# Patient Record
Sex: Male | Born: 1970 | Race: Black or African American | Hispanic: No | Marital: Married | Smoking: Former smoker
Health system: Southern US, Community
[De-identification: ages and names within clinical notes are randomized; demographics above are authoritative.]

## PROBLEM LIST (undated history)

## (undated) DIAGNOSIS — E119 Type 2 diabetes mellitus without complications: Secondary | ICD-10-CM

## (undated) DIAGNOSIS — D649 Anemia, unspecified: Secondary | ICD-10-CM

## (undated) DIAGNOSIS — N2581 Secondary hyperparathyroidism of renal origin: Secondary | ICD-10-CM

## (undated) DIAGNOSIS — N189 Chronic kidney disease, unspecified: Secondary | ICD-10-CM

## (undated) DIAGNOSIS — E785 Hyperlipidemia, unspecified: Secondary | ICD-10-CM

## (undated) DIAGNOSIS — R609 Edema, unspecified: Secondary | ICD-10-CM

## (undated) DIAGNOSIS — I519 Heart disease, unspecified: Secondary | ICD-10-CM

## (undated) DIAGNOSIS — I10 Essential (primary) hypertension: Secondary | ICD-10-CM

## (undated) DIAGNOSIS — K922 Gastrointestinal hemorrhage, unspecified: Secondary | ICD-10-CM

## (undated) DIAGNOSIS — R7881 Bacteremia: Secondary | ICD-10-CM

## (undated) DIAGNOSIS — I38 Endocarditis, valve unspecified: Secondary | ICD-10-CM

## (undated) DIAGNOSIS — Z992 Dependence on renal dialysis: Secondary | ICD-10-CM

## (undated) DIAGNOSIS — I420 Dilated cardiomyopathy: Secondary | ICD-10-CM

## (undated) DIAGNOSIS — J45909 Unspecified asthma, uncomplicated: Secondary | ICD-10-CM

## (undated) DIAGNOSIS — I509 Heart failure, unspecified: Secondary | ICD-10-CM

## (undated) DIAGNOSIS — T7840XA Allergy, unspecified, initial encounter: Secondary | ICD-10-CM

## (undated) DIAGNOSIS — I6522 Occlusion and stenosis of left carotid artery: Secondary | ICD-10-CM

## (undated) DIAGNOSIS — N186 End stage renal disease: Secondary | ICD-10-CM

## (undated) DIAGNOSIS — G4730 Sleep apnea, unspecified: Secondary | ICD-10-CM

## (undated) DIAGNOSIS — Z5189 Encounter for other specified aftercare: Secondary | ICD-10-CM

## (undated) HISTORY — DX: End stage renal disease: Z99.2

## (undated) HISTORY — PX: UPPER GASTROINTESTINAL ENDOSCOPY: SHX188

## (undated) HISTORY — DX: Dilated cardiomyopathy: I42.0

## (undated) HISTORY — PX: LAPAROSCOPIC GASTROTOMY W/ REPAIR OF ULCER: SUR772

## (undated) HISTORY — PX: COLONOSCOPY: SHX174

## (undated) HISTORY — DX: Essential (primary) hypertension: I10

## (undated) HISTORY — PX: TONSILLECTOMY: SUR1361

## (undated) HISTORY — DX: Anemia, unspecified: D64.9

## (undated) HISTORY — DX: Secondary hyperparathyroidism of renal origin: N25.81

## (undated) HISTORY — DX: Sleep apnea, unspecified: G47.30

## (undated) HISTORY — PX: PLEURAL SCARIFICATION: SHX748

## (undated) HISTORY — DX: Morbid (severe) obesity due to excess calories: E66.01

## (undated) HISTORY — DX: Endocarditis, valve unspecified: I38

## (undated) HISTORY — DX: End stage renal disease: N18.6

## (undated) HISTORY — PX: ACHILLES TENDON REPAIR: SUR1153

## (undated) HISTORY — DX: Occlusion and stenosis of left carotid artery: I65.22

## (undated) HISTORY — DX: Chronic kidney disease, unspecified: N18.9

## (undated) HISTORY — PX: DIALYSIS FISTULA CREATION: SHX611

## (undated) HISTORY — DX: Heart failure, unspecified: I50.9

## (undated) HISTORY — PX: OTHER SURGICAL HISTORY: SHX169

## (undated) HISTORY — DX: Unspecified asthma, uncomplicated: J45.909

## (undated) HISTORY — DX: Bacteremia: R78.81

## (undated) HISTORY — DX: Type 2 diabetes mellitus without complications: E11.9

## (undated) HISTORY — DX: Hyperlipidemia, unspecified: E78.5

## (undated) HISTORY — DX: Heart disease, unspecified: I51.9

## (undated) HISTORY — DX: Edema, unspecified: R60.9

## (undated) HISTORY — PX: STOMACH SURGERY: SHX791

## (undated) HISTORY — DX: Allergy, unspecified, initial encounter: T78.40XA

## (undated) HISTORY — DX: Encounter for other specified aftercare: Z51.89

## (undated) HISTORY — DX: Gastrointestinal hemorrhage, unspecified: K92.2

---

## 1999-09-27 ENCOUNTER — Ambulatory Visit: Admission: RE | Admit: 1999-09-27 | Discharge: 1999-09-27 | Payer: Self-pay | Admitting: Endocrinology

## 2000-03-10 ENCOUNTER — Encounter: Payer: Self-pay | Admitting: Endocrinology

## 2000-03-10 ENCOUNTER — Ambulatory Visit (HOSPITAL_COMMUNITY): Admission: RE | Admit: 2000-03-10 | Discharge: 2000-03-10 | Payer: Self-pay | Admitting: Endocrinology

## 2000-03-28 ENCOUNTER — Encounter: Admission: RE | Admit: 2000-03-28 | Discharge: 2000-06-26 | Payer: Self-pay | Admitting: Endocrinology

## 2007-10-18 ENCOUNTER — Ambulatory Visit: Payer: Self-pay | Admitting: Internal Medicine

## 2007-10-18 DIAGNOSIS — J309 Allergic rhinitis, unspecified: Secondary | ICD-10-CM | POA: Insufficient documentation

## 2007-10-18 DIAGNOSIS — G4733 Obstructive sleep apnea (adult) (pediatric): Secondary | ICD-10-CM | POA: Insufficient documentation

## 2007-10-28 DIAGNOSIS — J45909 Unspecified asthma, uncomplicated: Secondary | ICD-10-CM | POA: Insufficient documentation

## 2008-10-17 ENCOUNTER — Ambulatory Visit: Payer: Self-pay | Admitting: Internal Medicine

## 2008-11-10 ENCOUNTER — Inpatient Hospital Stay (HOSPITAL_COMMUNITY): Admission: EM | Admit: 2008-11-10 | Discharge: 2008-11-14 | Payer: Self-pay | Admitting: Emergency Medicine

## 2008-11-10 ENCOUNTER — Emergency Department (HOSPITAL_COMMUNITY): Admission: EM | Admit: 2008-11-10 | Discharge: 2008-11-10 | Payer: Self-pay | Admitting: Family Medicine

## 2008-11-10 ENCOUNTER — Encounter (INDEPENDENT_AMBULATORY_CARE_PROVIDER_SITE_OTHER): Payer: Self-pay | Admitting: Cardiology

## 2009-05-04 ENCOUNTER — Telehealth (INDEPENDENT_AMBULATORY_CARE_PROVIDER_SITE_OTHER): Payer: Self-pay | Admitting: *Deleted

## 2009-05-05 ENCOUNTER — Telehealth (INDEPENDENT_AMBULATORY_CARE_PROVIDER_SITE_OTHER): Payer: Self-pay | Admitting: *Deleted

## 2009-05-07 ENCOUNTER — Encounter: Payer: Self-pay | Admitting: Internal Medicine

## 2009-05-16 DIAGNOSIS — K922 Gastrointestinal hemorrhage, unspecified: Secondary | ICD-10-CM

## 2009-05-16 HISTORY — DX: Gastrointestinal hemorrhage, unspecified: K92.2

## 2009-06-10 ENCOUNTER — Encounter: Payer: Self-pay | Admitting: Internal Medicine

## 2009-06-16 ENCOUNTER — Telehealth: Payer: Self-pay | Admitting: Internal Medicine

## 2009-09-14 ENCOUNTER — Inpatient Hospital Stay (HOSPITAL_COMMUNITY): Admission: EM | Admit: 2009-09-14 | Discharge: 2009-09-17 | Payer: Self-pay | Admitting: Emergency Medicine

## 2009-09-15 ENCOUNTER — Ambulatory Visit: Payer: Self-pay | Admitting: Internal Medicine

## 2009-09-17 ENCOUNTER — Encounter (INDEPENDENT_AMBULATORY_CARE_PROVIDER_SITE_OTHER): Payer: Self-pay | Admitting: *Deleted

## 2009-10-14 ENCOUNTER — Ambulatory Visit: Payer: Self-pay | Admitting: Internal Medicine

## 2009-10-16 ENCOUNTER — Ambulatory Visit: Payer: Self-pay | Admitting: Internal Medicine

## 2010-02-15 ENCOUNTER — Telehealth (INDEPENDENT_AMBULATORY_CARE_PROVIDER_SITE_OTHER): Payer: Self-pay | Admitting: *Deleted

## 2010-06-15 NOTE — Miscellaneous (Signed)
Summary: CPAP Set-up/[facility]  CPAP Set-up/[facility]   Imported By: Bubba Hales 05/19/2009 09:24:20  _____________________________________________________________________  External Attachment:    Type:   Image     Comment:   External Document

## 2010-06-15 NOTE — Progress Notes (Signed)
Summary: CPAP autotitration 20  Phone Note Other Incoming   Summary of Call: Autotitration download [facility]. 20 CWP, good compliance and control at 20.

## 2010-06-15 NOTE — Progress Notes (Signed)
Summary: order Replacement CPAP  Phone Note Call from Patient Call back at [phone]   Caller: Patient Call For: young Reason for Call: Talk to Nurse Summary of Call: pt needs a whole new cpap machine, machine, mask, everything.  Can you send an order over to [facility] on [address]  Please notify pt when order has been sent. Initial call taken by: Zigmund Gottron,  May 04, 2009 11:58 AM  Follow-up for Phone Call        Pt last seen in June 2010.  Is this okay to go ahead and send order? Please advise thanks! Tilden Dome  May 04, 2009 12:13 PM   Additional Follow-up for Phone Call Additional follow up Details #1::        I will send order vis [facility] Additional Follow-up by: Deneise Lever MD,  May 04, 2009 1:13 PM    Additional Follow-up for Phone Call Additional follow up Details #2::    Spoke with pt and made aware that the order for cpap and supplies has been sent. Follow-up by: Tilden Dome,  May 04, 2009 1:48 PM

## 2010-06-15 NOTE — Progress Notes (Signed)
Summary: not sleeping long enough--change Zaleplon to Ambien   Phone Note Call from Patient   Caller: Patient Call For: YOUNG Summary of Call: pt says that the rx zaleplon is not helping him sleep. he falls asleep but doesn't STAY asleep. wakes up around 2am. pt # [id number]. [facility] [address] Initial call taken by: Cooper Render, CNA,  February 15, 2010 12:29 PM  Follow-up for Phone Call        called and spoke with pt.  pt states he was given an rx for Zaleplon to take as needed for his sleep.  Pt states he is waking up  between 2 and 3 am approx 2 to 3 times a week and states the Zaleplon doesn't help him fall back asleep- states it will take at least an hour or more before he is able to fall back asleep.  Pt wanted to know if there is something else he can try to help him sleep throughout the night.  Please advise.  thank you.  Jinny Blossom Reynolds LPN  October  3, 624THL 12:48 PM  NKDA  Additional Follow-up for Phone Call Additional follow up Details #1::        Suggest trial of Lorrin Mais - has he taken this before? Would offer ambien 10 mg, # 30, 1 for sleep at bedtime if needed. Call for refill if effective.  Additional Follow-up by: Deneise Lever MD,  February 16, 2010 9:01 AM    Additional Follow-up for Phone Call Additional follow up Details #2::    called and spoke with pt.  pt aware of CY's recs.  Pt states he hasn't tried Ambien before and is willing to try it.  Rx sent to pharmacy.  Pt is aware to call the office for refills if this med helps him.  Megan Reynolds LPN  October  4, 624THL 9:15 AM   New/Updated Medications: AMBIEN 10 MG TABS (ZOLPIDEM TARTRATE) Take 1 tab by mouth at bedtime as needed Prescriptions: AMBIEN 10 MG TABS (ZOLPIDEM TARTRATE) Take 1 tab by mouth at bedtime as needed  #30 x 0   Entered by:   Matthew Folks LPN   Authorized by:   Deneise Lever MD   Signed by:   Matthew Folks LPN on 624THL   Method used:   Telephoned to ...       [facility]  [address] [phone]* (retail)       [address]       [place], [place]  [address]       Ph: [phone] or [phone]       Fax: [phone]   RxID:   [id number]

## 2010-06-15 NOTE — Assessment & Plan Note (Signed)
Summary: 12 months/apc   Primary Provider/Referring Provider:  Dr. Wilson Singer  CC:  Yearly follow up visit-sleep apnea-wakes up every morning around 4am-up for around an hour or so.Marland Kitchen  History of Present Illness: History of Present Illness: 6/61- This 40 year old man comes to establish as a new patient for management of sleep apnea.  He was diagnosed with sleep apnea about 5 years ago and is currently using CPAP 19 CWP.  He says it works very well.  He needs replacement mask and supplys.  He would like to change to [facility].  Bedtime between 11 p.m. at 1 a.m..  Estimates 30 minutes sleep latency.  May get up once during the night before final waking at 6 a.m.  He has gained 10 pounds in two years.  Has had tonsillectomy.  Incidental nasal congestion. Denies headache, sinus drainage, sneezing chest pain, dyspnea, n/v/d, weight loss, fever, edema.   Nov 10, 2008- OSA, Obesity, HTN CPAP remains at 19, changed to [facility]. He has begun noting more waking at night, without aware of increased snore or sleepiness. He has sedentary job. He asks expectations for cpap if he were able to loose weight.  Discussed his hypertension. Denies nasal congestion  October 16, 2009- OSA, obesity, HTN Recent Hosp for UGI bleeding ulcer from Aspirin. He continues CPAP 20 every night. For past two months he has been waking 4AM for no reason. Will go back to sleep after reading for an hour so., Bedtime 10-12 MN. Some days caffeine, most days not. No longer has headaches. CPAP every night with good control. Says breathing "fine"- quit smoking 6 weeks ago. Not bothered by nasal congestion or drainage.   Preventive Screening-Counseling & Management  Alcohol-Tobacco     Smoking Status: quit < 6 months  Current Medications (verified): 1)  Cpap 20 Advanced 2)  Hydralazine Hcl 100 Mg Tabs (Hydralazine Hcl) .... Take 1 By Mouth Three Times A Day 3)  Furosemide 80 Mg Tabs (Furosemide) .... Take 2 By Mouth Two Times A  Day 4)  Carvedilol 25 Mg Tabs (Carvedilol) .... Take 1 By Mouth Two Times A Day 5)  Norvasc 5 Mg Tabs (Amlodipine Besylate) .... Take 1 By Mouth Once Daily 6)  Calcitriol 0.25 Mcg Caps (Calcitriol) .... Take 1  By Mouth Once Daily 7)  Glimepiride 1 Mg Tabs (Glimepiride) .... 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[employer]  Risk Factors: Smoking Status: quit < 6 months (10/16/2009)  Past Medical History: Sleep Apnea Allergic Rhinitis Asthma Morbid obesity Hypertension UGI bleed- aspirin-2011  Social History: married smoker  1 pack per week- quit 01-Sep-2009 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  Vital Signs:  Patient profile:   40 year old male Height:      67 inches Weight:      349 pounds BMI:     54.86 O2 Sat:      95 % on Room air Pulse rate:   92 / minute BP sitting:   136 / 80  (left arm) Cuff size:   large  Vitals Entered By: Clayborne Dana CMA (October 16, 2009 1:39 PM)  O2 Flow:  Room air  Physical  Exam  Additional Exam:  General: A/Ox3; pleasant and cooperative, NAD, morbidly obese SKIN: no rash, lesions NODES: no lymphadenopathy HEENT: [place]/AT, EOM- WNL, Conjuctivae- clear, PERRLA, TM-WNL, Nose- clear, Throat- clear and wnl, Mallampati III NECK: Supple w/ fair ROM, JVD- none, normal carotid impulses w/o bruits Thyroid- normal to palpation CHEST: mild raspy cough HEART: RRR, no m/g/r heard ABDOMEN:obese [id number], nl pulses, no edema  NEURO: Grossly intact to observation      Impression & Recommendations:  Problem # 1:  SLEEP APNEA (ICD-780.57)  Good compliance and control on CPAP. Recent new problem of insomnia, waking after sleep onset. We discussed sleep hygiene, room temperature and half-life of available meds. Weight loss would help the apnea.  Problem # 2:  ALLERGIC RHINITIS (ICD-477.9) He denies significant problem this Spring. it likely helps that he quit smoking.  Problem # 3:  ASTHMA (ICD-493.90) Control has not been a problem. He seems to distinguish between sleep apnea issues and nocturnal asthma.  Medications Added to Medication List This Visit: 1)  Hydralazine Hcl 100 Mg Tabs (Hydralazine hcl) .... Take 1 by mouth three times a day 2)  Furosemide 80 Mg Tabs (Furosemide) .... Take 2 by mouth two times a day 3)  Carvedilol 25 Mg Tabs (Carvedilol) .... Take 1 by mouth two times a day 4)  Norvasc 5 Mg Tabs (Amlodipine besylate) .... Take 1 by mouth once daily 5)  Calcitriol 0.25 Mcg Caps (Calcitriol) .... Take 1  by mouth once daily 6)  Glimepiride 1 Mg Tabs (Glimepiride) .... Take 1 by mouth once daily 7)  Zaleplon 5 Mg Caps (Zaleplon) .Marland Kitchen.. 1 for sleep if needed  Other Orders: Est. Patient Level IV [id number])  Patient Instructions: 1)  Please schedule a follow-up appointment in 1 year. 2)  Keep bedroom comfortably cool. 3)  Try generic sonata , 1 on waking during night as needed Prescriptions: ZALEPLON 5 MG CAPS (ZALEPLON) 1 for sleep if needed  #25 x 2    Entered and Authorized by:   Deneise Lever MD   Signed by:   Deneise Lever MD on 10/16/2009   Method used:   Print then Give to Patient   [id number] ZALEPLON 5 MG CAPS (ZALEPLON) 1 for sleep if needed  #25 x 2   Entered and Authorized by:   Deneise Lever MD   Signed by:   Deneise Lever MD on 10/16/2009   Method used:   Print then Give to Patient   RxID:   [id number]

## 2010-06-15 NOTE — Procedures (Signed)
Summary: Upper Endoscopy  Patient: [name] Note: All result statuses are Final unless otherwise noted.  Tests: (1) Upper Endoscopy (EGD)   EGD Upper Endoscopy       [facility]     [place], [place]  [address]           ENDOSCOPY PROCEDURE REPORT           PATIENT:  Adrian Frye, Adrian Frye  MR#:  [id number]     BIRTHDATE:  June 03, 1970, 38 yrs. old  GENDER:  male           ENDOSCOPIST:  Docia Chuck. Geri Seminole, MD     Referred by:  [facility],           PROCEDURE DATE:  09/15/2009     PROCEDURE:  EGD with biopsy,     EGD for control of bleeding           ASA CLASS:  Class II     INDICATIONS:  melena           MEDICATIONS:   Fentanyl 75 mcg IV, Versed 7 mg IV, Epinephrine 2     cc SQ     TOPICAL ANESTHETIC:  Cetacaine Spray           DESCRIPTION OF PROCEDURE:   After the risks benefits and     alternatives of the procedure were thoroughly explained, informed     consent was obtained.  The [id number] endoscope was     introduced through the mouth and advanced to the second portion of     the duodenum, without limitations.  The instrument was slowly     withdrawn as the mucosa was fully examined.     <<PROCEDUREIMAGES>>           A 62mm prepyloric antral ulcer with visible vessel was found in the     antrum.  Multiple superficial erosions were found in the antrum as     well.  Duodenitis (erythema and edema) was found in the bulb of     the duodenum. The remainder of the EGD was normal. NO ACTIVE     BLEEDING.   Retroflexed views revealed no abnormalities.  CLO BX     TAKEN TO R/O H. PYLORI.           THERAPY; EPI [1:10,000] 2.5 CC INJECTED. SUBSEQUENTLY 2 ENDO CLIPS     PLACED IN GOOD POSITION SEALING ULCER           The scope was then withdrawn from the patient and the procedure     completed.           COMPLICATIONS:  None           ENDOSCOPIC IMPRESSION:     1) Ulcer in the antrum - S/P ENDOSCOPIC HEMOSTATIC  THERAPY     2) Erosions, multiple in the antrum     3) Duodenitis in the bulb of duodenum     4)S/P CLO TEST           RECOMMENDATIONS:     1) continue PPI INFUSION     2) Rx CLO if positive     3) avoid NSAIDS / ASA           _____________________________     Docia Chuck. Geri Seminole, MD           CC:  Jani Gravel, MD, The  Patient           n.     eSIGNED:   Docia Chuck. Geri Seminole at 09/15/2009 05:22 PM           [name], [id number]  Note: An exclamation mark (!) indicates a result that was not dispersed into the flowsheet. Document Creation Date: 09/15/2009 5:23 PM _______________________________________________________________________  (1) Order result status: Final Collection or observation date-time: 09/15/2009 17:07 Requested date-time:  Receipt date-time:  Reported date-time:  Referring Physician:   Ordering Physician: Lavena Bullion [phone]) Specimen Source:  Source: Tawanna Cooler Order Number: [id number] Lab site:

## 2010-06-15 NOTE — Discharge Summary (Signed)
Summary: Discharge Summary    NAME:  Adrian Frye, Adrian Frye NO.:  [id number]      MEDICAL RECORD NO.:  [id number]          PATIENT TYPE:  INP      LOCATION:  [id number]                         FACILITY:  [facility]      PHYSICIAN:  Verlee Monte, MD       DATE OF BIRTH:  1970/10/11      DATE OF ADMISSION:  09/14/2009   DATE OF DISCHARGE:                                  DISCHARGE SUMMARY      REASON FOR ADMISSION:  My stool is black.      PRIMARY NEPHROLOGIST:  Dr. Fleet Contras.      CARDIOLOGIST:  Dr. [name].      DISCHARGE DIAGNOSES:   1. Upper GI bleed secondary to gastric antral ulcer.  Positive for H.       pylori.   2. Iron deficiency microcytic anemia..   3. Chronic kidney disease stage 3-4.   4. Hypertension.   5. Diabetes mellitus type 2.   6. Morbid obesity   7. Congestive heart failure with systolic ejection fraction 25%-30%.   8. Sleep apnea on CPAP.   9. Secondary hyperparathyroidism on calcitriol.   10.History of pneumothorax at age 52.   11.History of exploratory laparotomy following football accident.   12.Achilles tendon surgery.      CONSULTS:   1. Nephrology service with Dr. Jimmy Footman.   2. Gastroenterology, Dr. Scarlette Shorts.      DISCHARGE MEDICATIONS:   1. Biaxin 500 mg p.o. b.i.d. for 14 days.   2. Metronidazole 500 mg p.o. b.i.d. for 14 days.   3. Protonix 40 mg p.o. b.i.d.   4. Amlodipine 5 mg p.o. daily.   5. Calcitriol 0.25 mcg p.o. daily.   6. Carvedilol 25 mg one and a half tablets by mouth b.i.d.   7. Furosemide 80 mg, take 2 tablets p.o. b.i.d.   8. Hydralazine 100 mg p.o. t.i.d.   9. Loperamide 2 mg p.o. daily as needed for diarrhea.   10.Stop taking the following medications:   11.Lisinopril 40 mg p.o. daily.   12.Aspirin 325 mg p.o. daily.      BRIEF HISTORY:  Adrian Frye is a 40 year old male with history of   hypertension, diabetes, chronic kidney disease, stage 3.  The patient   has sleep apnea.  The patient came with  chief complaint of black stools   since Saturday.  The patient acknowledges occasional heartburn but   denies any nausea, vomiting, hematemesis, abdominal pain, diarrhea,   constipation, bright red blood per rectum.  The patient taking full dose   aspirin daily but other than that does not use any other NSAID.  The   patient went to Urgent Care at [facility] and apparently his   hemoglobin was 6.5.  The patient admitted to the hospital for GI bleed.   1. Upper GI bleed secondary to gastric antral ulcer with positive H.       pylori.  The patient admitted to the hospital.  Hemoglobin was       double-checked upon  admission was 6.8.  Anemia panel was obtained.       The patient total iron is 40 and his ferritin is 18 and MCV is 86.       The patient has iron deficiency anemia probably secondary to the       gastric bleeding and/or the chronic kidney disease.  Dr. Scarlette Shorts       from gastroenterology service was consulted.  EGD was done on May       3, showed 6 mm prepyloric antral ulcer was visible, found in the       antrum with multiple superficial erosions around the antrum as well       as duodenitis.  H. pylori test was  positive.  The patient had some       clipping to stop the bleeding.  The patient will be placed on       b.i.d. Protonix for a month and he will have Biaxin and Flagyl       twice a day for total of 14 days.  The patient to see Dr. [name] as       outpatient.   2. Anemia.  Iron deficiency.  Iron deficiency anemia is probably       secondary to the GI bleed plus the chronic kidney disease.  The       patient got IV iron in the hospital and had 3 units of blood       transfused during hospitalization.  His hemoglobin went back to       8.7.  There is no evidence of bleeding.  The patient has no bowel       movement since being in the hospital.  The patient will be       discharged on the PPI to follow up with Dr. [name].   3. Chronic kidney disease.  The patient is  followed with Dr.       Mercy Moore.  The patient on high dose of Lasix of 160 mg b.i.d. That       was restarted day of discharge.  His lisinopril 40 mg was held as       recommended by nephrology service.   4. Congestive heart failure.  The patient has systolic ejection       fraction of about 28% in June 2010.  The patient is on Coreg and       furosemide.  The patient was on lisinopril but was held because of       renal service recommendations.  The patient to follow up with Dr.       [name] as needed.      DISCHARGE INSTRUCTIONS:   1. Activity as tolerated.   2. Diet low sodium heart healthy renal diet.      FOLLOW UP:   1. Follow up with Dr. Scarlette Shorts, June 1 at 2:30 p.m.   2. Follow up with Dr. Mercy Moore.      DISPOSITION:  Home.               Verlee Monte, MD            ME/MEDQ  D:  09/17/2009  T:  09/17/2009  Job:  [id number]      cc:   Windy Kalata, M.D.   Ples Specter. [name], M.D.      Electronically Signed by Verlee Monte  on 10/05/2009 10:25:19 PM

## 2010-08-03 LAB — CROSSMATCH
ABO/RH(D): B POS
Antibody Screen: NEGATIVE

## 2010-08-03 LAB — RENAL FUNCTION PANEL
Albumin: 3.6 g/dL (ref 3.5–5.2)
Albumin: 3.8 g/dL (ref 3.5–5.2)
Albumin: 3.9 g/dL (ref 3.5–5.2)
BUN: 104 mg/dL — ABNORMAL HIGH (ref 6–23)
BUN: 57 mg/dL — ABNORMAL HIGH (ref 6–23)
BUN: 74 mg/dL — ABNORMAL HIGH (ref 6–23)
CO2: 29 mEq/L (ref 19–32)
CO2: 30 mEq/L (ref 19–32)
CO2: 31 mEq/L (ref 19–32)
Calcium: 8.3 mg/dL — ABNORMAL LOW (ref 8.4–10.5)
Calcium: 8.3 mg/dL — ABNORMAL LOW (ref 8.4–10.5)
Calcium: 8.3 mg/dL — ABNORMAL LOW (ref 8.4–10.5)
Chloride: 100 mEq/L (ref 96–112)
Chloride: 99 mEq/L (ref 96–112)
Chloride: 99 mEq/L (ref 96–112)
Creatinine, Ser: 3.35 mg/dL — ABNORMAL HIGH (ref 0.4–1.5)
Creatinine, Ser: 3.56 mg/dL — ABNORMAL HIGH (ref 0.4–1.5)
Creatinine, Ser: 3.97 mg/dL — ABNORMAL HIGH (ref 0.4–1.5)
GFR calc Af Amer: 21 mL/min — ABNORMAL LOW (ref >60)
GFR calc Af Amer: 23 mL/min — ABNORMAL LOW (ref >60)
GFR calc Af Amer: 25 mL/min — ABNORMAL LOW (ref >60)
GFR calc non Af Amer: 17 mL/min — ABNORMAL LOW (ref >60)
GFR calc non Af Amer: 19 mL/min — ABNORMAL LOW (ref >60)
GFR calc non Af Amer: 21 mL/min — ABNORMAL LOW (ref >60)
Glucose, Bld: 132 mg/dL — ABNORMAL HIGH (ref 70–99)
Glucose, Bld: 142 mg/dL — ABNORMAL HIGH (ref 70–99)
Glucose, Bld: 147 mg/dL — ABNORMAL HIGH (ref 70–99)
Phosphorus: 5.1 mg/dL — ABNORMAL HIGH (ref 2.3–4.6)
Phosphorus: 5.1 mg/dL — ABNORMAL HIGH (ref 2.3–4.6)
Phosphorus: 5.3 mg/dL — ABNORMAL HIGH (ref 2.3–4.6)
Potassium: 3.1 mEq/L — ABNORMAL LOW (ref 3.5–5.1)
Potassium: 3.2 mEq/L — ABNORMAL LOW (ref 3.5–5.1)
Potassium: 3.4 mEq/L — ABNORMAL LOW (ref 3.5–5.1)
Sodium: 138 mEq/L (ref 135–145)
Sodium: 139 mEq/L (ref 135–145)
Sodium: 141 mEq/L (ref 135–145)

## 2010-08-03 LAB — CBC
HCT: 20.1 % — ABNORMAL LOW (ref 39.0–52.0)
HCT: 25.6 % — ABNORMAL LOW (ref 39.0–52.0)
HCT: 26.6 % — ABNORMAL LOW (ref 39.0–52.0)
HCT: 27.3 % — ABNORMAL LOW (ref 39.0–52.0)
Hemoglobin: 6.8 g/dL — CL (ref 13.0–17.0)
Hemoglobin: 8.7 g/dL — ABNORMAL LOW (ref 13.0–17.0)
Hemoglobin: 9.2 g/dL — ABNORMAL LOW (ref 13.0–17.0)
Hemoglobin: 9.4 g/dL — ABNORMAL LOW (ref 13.0–17.0)
MCHC: 33.9 g/dL (ref 30.0–36.0)
MCHC: 33.9 g/dL (ref 30.0–36.0)
MCHC: 34.3 g/dL (ref 30.0–36.0)
MCHC: 34.4 g/dL (ref 30.0–36.0)
MCV: 86.1 fL (ref 78.0–100.0)
MCV: 86.5 fL (ref 78.0–100.0)
MCV: 86.7 fL (ref 78.0–100.0)
MCV: 87.1 fL (ref 78.0–100.0)
Platelets: 114 10*3/uL — ABNORMAL LOW (ref 150–400)
Platelets: 125 10*3/uL — ABNORMAL LOW (ref 150–400)
Platelets: 127 10*3/uL — ABNORMAL LOW (ref 150–400)
Platelets: 130 10*3/uL — ABNORMAL LOW (ref 150–400)
RBC: 2.33 MIL/uL — ABNORMAL LOW (ref 4.22–5.81)
RBC: 2.94 MIL/uL — ABNORMAL LOW (ref 4.22–5.81)
RBC: 3.08 MIL/uL — ABNORMAL LOW (ref 4.22–5.81)
RBC: 3.15 MIL/uL — ABNORMAL LOW (ref 4.22–5.81)
RDW: 16 % — ABNORMAL HIGH (ref 11.5–15.5)
RDW: 16.1 % — ABNORMAL HIGH (ref 11.5–15.5)
RDW: 16.3 % — ABNORMAL HIGH (ref 11.5–15.5)
RDW: 16.5 % — ABNORMAL HIGH (ref 11.5–15.5)
WBC: 11.9 10*3/uL — ABNORMAL HIGH (ref 4.0–10.5)
WBC: 12.3 10*3/uL — ABNORMAL HIGH (ref 4.0–10.5)
WBC: 12.9 10*3/uL — ABNORMAL HIGH (ref 4.0–10.5)
WBC: 13.9 10*3/uL — ABNORMAL HIGH (ref 4.0–10.5)

## 2010-08-03 LAB — IRON AND TIBC
Iron: 40 ug/dL — ABNORMAL LOW (ref 42–135)
Saturation Ratios: 13 % — ABNORMAL LOW (ref 20–55)
TIBC: 313 ug/dL (ref 215–435)
UIBC: 273 ug/dL

## 2010-08-03 LAB — POCT I-STAT, CHEM 8
BUN: 139 mg/dL — ABNORMAL HIGH (ref 6–23)
Calcium, Ion: 1.02 mmol/L — ABNORMAL LOW (ref 1.12–1.32)
Chloride: 99 mEq/L (ref 96–112)
Creatinine, Ser: 5.3 mg/dL — ABNORMAL HIGH (ref 0.4–1.5)
Glucose, Bld: 152 mg/dL — ABNORMAL HIGH (ref 70–99)
HCT: 22 % — ABNORMAL LOW (ref 39.0–52.0)
Hemoglobin: 7.5 g/dL — ABNORMAL LOW (ref 13.0–17.0)
Potassium: 3.2 mEq/L — ABNORMAL LOW (ref 3.5–5.1)
Sodium: 137 mEq/L (ref 135–145)
TCO2: 28 mmol/L (ref 0–100)

## 2010-08-03 LAB — PROTIME-INR
INR: 1.07 (ref 0.00–1.49)
Prothrombin Time: 13.8 seconds (ref 11.6–15.2)

## 2010-08-03 LAB — URINE CULTURE: Colony Count: 30000

## 2010-08-03 LAB — URINALYSIS, ROUTINE W REFLEX MICROSCOPIC
Bilirubin Urine: NEGATIVE
Glucose, UA: NEGATIVE mg/dL
Ketones, ur: NEGATIVE mg/dL
Leukocytes, UA: NEGATIVE
Nitrite: NEGATIVE
Protein, ur: NEGATIVE mg/dL
Specific Gravity, Urine: 1.011 (ref 1.005–1.030)
Urobilinogen, UA: 0.2 mg/dL (ref 0.0–1.0)
pH: 5.5 (ref 5.0–8.0)

## 2010-08-03 LAB — HEMOGLOBIN A1C
Hgb A1c MFr Bld: 7.1 % — ABNORMAL HIGH (ref <5.7)
Mean Plasma Glucose: 157 mg/dL — ABNORMAL HIGH (ref <117)

## 2010-08-03 LAB — RETICULOCYTES
RBC.: 2.51 MIL/uL — ABNORMAL LOW (ref 4.22–5.81)
Retic Count, Absolute: 70.3 10*3/uL (ref 19.0–186.0)
Retic Ct Pct: 2.8 % (ref 0.4–3.1)

## 2010-08-03 LAB — PREPARE RBC (CROSSMATCH)

## 2010-08-03 LAB — CLOTEST (H. PYLORI), BIOPSY: Helicobacter screen: POSITIVE — AB

## 2010-08-03 LAB — URINE MICROSCOPIC-ADD ON

## 2010-08-03 LAB — ABO/RH: ABO/RH(D): B POS

## 2010-08-03 LAB — APTT: aPTT: 26 seconds (ref 24–37)

## 2010-08-03 LAB — VITAMIN B12: Vitamin B-12: 410 pg/mL (ref 211–911)

## 2010-08-03 LAB — FERRITIN: Ferritin: 18 ng/mL — ABNORMAL LOW (ref 22–322)

## 2010-08-03 LAB — FOLATE: Folate: 13.9 ng/mL

## 2010-08-22 LAB — BASIC METABOLIC PANEL
BUN: 31 mg/dL — ABNORMAL HIGH (ref 6–23)
CO2: 28 mEq/L (ref 19–32)
Calcium: 8.2 mg/dL — ABNORMAL LOW (ref 8.4–10.5)
Chloride: 95 mEq/L — ABNORMAL LOW (ref 96–112)
Creatinine, Ser: 3.58 mg/dL — ABNORMAL HIGH (ref 0.4–1.5)
GFR calc Af Amer: 23 mL/min — ABNORMAL LOW (ref >60)
GFR calc non Af Amer: 19 mL/min — ABNORMAL LOW (ref >60)
Glucose, Bld: 135 mg/dL — ABNORMAL HIGH (ref 70–99)
Potassium: 3.8 mEq/L (ref 3.5–5.1)
Sodium: 132 mEq/L — ABNORMAL LOW (ref 135–145)

## 2010-08-23 LAB — POCT I-STAT, CHEM 8
BUN: 24 mg/dL — ABNORMAL HIGH (ref 6–23)
Calcium, Ion: 0.99 mmol/L — ABNORMAL LOW (ref 1.12–1.32)
Chloride: 101 mEq/L (ref 96–112)
Creatinine, Ser: 2.9 mg/dL — ABNORMAL HIGH (ref 0.4–1.5)
Glucose, Bld: 155 mg/dL — ABNORMAL HIGH (ref 70–99)
HCT: 41 % (ref 39.0–52.0)
Hemoglobin: 13.9 g/dL (ref 13.0–17.0)
Potassium: 3.3 mEq/L — ABNORMAL LOW (ref 3.5–5.1)
Sodium: 138 mEq/L (ref 135–145)
TCO2: 29 mmol/L (ref 0–100)

## 2010-08-23 LAB — CORTISOL, URINE, FREE: Volume, Urine-CORTUR: 1075 mL

## 2010-08-23 LAB — BASIC METABOLIC PANEL
BUN: 20 mg/dL (ref 6–23)
BUN: 25 mg/dL — ABNORMAL HIGH (ref 6–23)
CO2: 27 mEq/L (ref 19–32)
CO2: 28 mEq/L (ref 19–32)
Calcium: 8 mg/dL — ABNORMAL LOW (ref 8.4–10.5)
Calcium: 8.4 mg/dL (ref 8.4–10.5)
Chloride: 95 mEq/L — ABNORMAL LOW (ref 96–112)
Chloride: 98 mEq/L (ref 96–112)
Creatinine, Ser: 2.72 mg/dL — ABNORMAL HIGH (ref 0.4–1.5)
Creatinine, Ser: 3.38 mg/dL — ABNORMAL HIGH (ref 0.4–1.5)
GFR calc Af Amer: 25 mL/min — ABNORMAL LOW (ref >60)
GFR calc Af Amer: 32 mL/min — ABNORMAL LOW (ref >60)
GFR calc non Af Amer: 21 mL/min — ABNORMAL LOW (ref >60)
GFR calc non Af Amer: 26 mL/min — ABNORMAL LOW (ref >60)
Glucose, Bld: 137 mg/dL — ABNORMAL HIGH (ref 70–99)
Glucose, Bld: 153 mg/dL — ABNORMAL HIGH (ref 70–99)
Potassium: 3.2 mEq/L — ABNORMAL LOW (ref 3.5–5.1)
Potassium: 3.6 mEq/L (ref 3.5–5.1)
Sodium: 131 mEq/L — ABNORMAL LOW (ref 135–145)
Sodium: 139 mEq/L (ref 135–145)

## 2010-08-23 LAB — ALDOSTERONE, URINE, 24 HOUR
Aldosterone, 24H Ur: 34 ug/d
Creatinine, Urine mg/day-ALDU: 2182 mg/d (ref 1000–2500)
Creatinine, Urine-mg/dL-ALDU: 203 mg/dL
Time-ALDU: 24 hr
Volume, Urine-ALDU: 1075 mL

## 2010-08-23 LAB — CARDIAC PANEL(CRET KIN+CKTOT+MB+TROPI)
CK, MB: 2.2 ng/mL (ref 0.3–4.0)
CK, MB: 2.3 ng/mL (ref 0.3–4.0)
Relative Index: 0.7 (ref 0.0–2.5)
Relative Index: 0.8 (ref 0.0–2.5)
Total CK: 262 U/L — ABNORMAL HIGH (ref 7–232)
Total CK: 316 U/L — ABNORMAL HIGH (ref 7–232)
Troponin I: 0.07 ng/mL — ABNORMAL HIGH (ref 0.00–0.06)
Troponin I: 0.07 ng/mL — ABNORMAL HIGH (ref 0.00–0.06)

## 2010-08-23 LAB — HEMOGLOBIN A1C
Hgb A1c MFr Bld: 7.4 % — ABNORMAL HIGH (ref 4.6–6.1)
Mean Plasma Glucose: 166 mg/dL

## 2010-08-23 LAB — METANEPHRINES, URINE, 24 HOUR
Metaneph Total, Ur: 728 mcg/24 h — ABNORMAL HIGH (ref 115–695)
Metanephrines, Ur: 297 mcg/24 h — ABNORMAL HIGH (ref 36–190)
Normetanephrine, 24H Ur: 431 mcg/24 h (ref 35–482)
Volume, Urine-METAN: 1075 mL

## 2010-08-23 LAB — URINALYSIS, ROUTINE W REFLEX MICROSCOPIC
Bilirubin Urine: NEGATIVE
Glucose, UA: NEGATIVE mg/dL
Ketones, ur: NEGATIVE mg/dL
Leukocytes, UA: NEGATIVE
Nitrite: NEGATIVE
Protein, ur: >300 mg/dL — AB
Specific Gravity, Urine: 1.012 (ref 1.005–1.030)
Urobilinogen, UA: 0.2 mg/dL (ref 0.0–1.0)
pH: 6.5 (ref 5.0–8.0)

## 2010-08-23 LAB — MAGNESIUM: Magnesium: 2.3 mg/dL (ref 1.5–2.5)

## 2010-08-23 LAB — CBC
HCT: 35.3 % — ABNORMAL LOW (ref 39.0–52.0)
HCT: 38.7 % — ABNORMAL LOW (ref 39.0–52.0)
HCT: 40.4 % (ref 39.0–52.0)
Hemoglobin: 11.5 g/dL — ABNORMAL LOW (ref 13.0–17.0)
Hemoglobin: 12.8 g/dL — ABNORMAL LOW (ref 13.0–17.0)
Hemoglobin: 13.4 g/dL (ref 13.0–17.0)
MCHC: 32.7 g/dL (ref 30.0–36.0)
MCHC: 33 g/dL (ref 30.0–36.0)
MCHC: 33.1 g/dL (ref 30.0–36.0)
MCV: 86.3 fL (ref 78.0–100.0)
MCV: 86.9 fL (ref 78.0–100.0)
MCV: 86.9 fL (ref 78.0–100.0)
Platelets: 125 10*3/uL — ABNORMAL LOW (ref 150–400)
Platelets: 136 10*3/uL — ABNORMAL LOW (ref 150–400)
Platelets: ADEQUATE 10*3/uL (ref 150–400)
RBC: 4.06 MIL/uL — ABNORMAL LOW (ref 4.22–5.81)
RBC: 4.45 MIL/uL (ref 4.22–5.81)
RBC: 4.68 MIL/uL (ref 4.22–5.81)
RDW: 15.8 % — ABNORMAL HIGH (ref 11.5–15.5)
RDW: 15.9 % — ABNORMAL HIGH (ref 11.5–15.5)
RDW: 16 % — ABNORMAL HIGH (ref 11.5–15.5)
WBC: 10.3 10*3/uL (ref 4.0–10.5)
WBC: 10.7 10*3/uL — ABNORMAL HIGH (ref 4.0–10.5)
WBC: 12.6 10*3/uL — ABNORMAL HIGH (ref 4.0–10.5)

## 2010-08-23 LAB — CATECHOLAMINES, FRACTIONATED, URINE, 24 HOUR
Catecholamines T: 42 ug/24hr (ref <100)
Dopamine 24 Hr Urine: 127 ug/24hr (ref <500)
Epinephrine 24 Hr Urine: 6 ug/24hr (ref <20)
Norepinephrine 24 Hr Urine: 35 ug/24hr (ref <80)
Total urine volume: 1075 mL

## 2010-08-23 LAB — URINALYSIS, MICROSCOPIC ONLY
Bilirubin Urine: NEGATIVE
Glucose, UA: 100 mg/dL — AB
Ketones, ur: NEGATIVE mg/dL
Leukocytes, UA: NEGATIVE
Nitrite: NEGATIVE
Protein, ur: >300 mg/dL — AB
Specific Gravity, Urine: 1.022 (ref 1.005–1.030)
Urobilinogen, UA: 0.2 mg/dL (ref 0.0–1.0)
pH: 6 (ref 5.0–8.0)

## 2010-08-23 LAB — CK TOTAL AND CKMB (NOT AT ARMC)
CK, MB: 2.7 ng/mL (ref 0.3–4.0)
Relative Index: 0.7 (ref 0.0–2.5)
Total CK: 383 U/L — ABNORMAL HIGH (ref 7–232)

## 2010-08-23 LAB — PROTEIN / CREATININE RATIO, URINE
Creatinine, Urine: 114.4 mg/dL
Protein Creatinine Ratio: 0.78 — ABNORMAL HIGH (ref 0.00–0.15)
Total Protein, Urine: 89 mg/dL

## 2010-08-23 LAB — BRAIN NATRIURETIC PEPTIDE: Pro B Natriuretic peptide (BNP): 317 pg/mL — ABNORMAL HIGH (ref 0.0–100.0)

## 2010-08-23 LAB — SODIUM, URINE, RANDOM
Sodium, Ur: 72 mEq/L
Sodium, Ur: 93 mEq/L

## 2010-08-23 LAB — PROTEIN ELECTROPHORESIS, SERUM
Albumin ELP: 56.5 % (ref 55.8–66.1)
Alpha-1-Globulin: 5.9 % — ABNORMAL HIGH (ref 2.9–4.9)
Alpha-2-Globulin: 12.3 % — ABNORMAL HIGH (ref 7.1–11.8)
Beta 2: 6.3 % (ref 3.2–6.5)
Beta Globulin: 6.4 % (ref 4.7–7.2)
Gamma Globulin: 12.6 % (ref 11.1–18.8)
M-Spike, %: NOT DETECTED g/dL
Total Protein ELP: 6.2 g/dL (ref 6.0–8.3)

## 2010-08-23 LAB — PTH, INTACT AND CALCIUM
Calcium, Total (PTH): 8.1 mg/dL — ABNORMAL LOW (ref 8.4–10.5)
PTH: 169.7 pg/mL — ABNORMAL HIGH (ref 14.0–72.0)

## 2010-08-23 LAB — URINE MICROSCOPIC-ADD ON

## 2010-08-23 LAB — POCT CARDIAC MARKERS
CKMB, poc: 1.3 ng/mL (ref 1.0–8.0)
CKMB, poc: <1 ng/mL — ABNORMAL LOW (ref 1.0–8.0)
Myoglobin, poc: 440 ng/mL (ref 12–200)
Myoglobin, poc: >500 ng/mL (ref 12–200)
Troponin i, poc: <0.05 ng/mL (ref 0.00–0.09)
Troponin i, poc: <0.05 ng/mL (ref 0.00–0.09)

## 2010-08-23 LAB — TSH: TSH: 0.909 u[IU]/mL (ref 0.350–4.500)

## 2010-08-23 LAB — DIFFERENTIAL
Basophils Absolute: 0 10*3/uL (ref 0.0–0.1)
Basophils Relative: 0 % (ref 0–1)
Eosinophils Absolute: 0.1 10*3/uL (ref 0.0–0.7)
Eosinophils Relative: 1 % (ref 0–5)
Lymphocytes Relative: 20 % (ref 12–46)
Lymphs Abs: 2.1 10*3/uL (ref 0.7–4.0)
Monocytes Absolute: 0.6 10*3/uL (ref 0.1–1.0)
Monocytes Relative: 5 % (ref 3–12)
Neutro Abs: 7.8 10*3/uL — ABNORMAL HIGH (ref 1.7–7.7)
Neutrophils Relative %: 73 % (ref 43–77)

## 2010-08-23 LAB — 5 HIAA, QUANTITATIVE, URINE, 24 HOUR
5-HIAA, 24 Hr Urine: 4 mg/24 h (ref <=6.0)
Volume, Urine-5HIAA: 1075 mL/24 h

## 2010-08-23 LAB — PHOSPHORUS: Phosphorus: 5.4 mg/dL — ABNORMAL HIGH (ref 2.3–4.6)

## 2010-08-23 LAB — VMA + CREATININE, URINE (TIMED COLLECTION)
VMA, 24H Ur Adult: 5.1 mg/24hr (ref 1.8–6.7)
Volume, Urine-VMAUR: 1075

## 2010-08-23 LAB — TROPONIN I: Troponin I: 0.09 ng/mL — ABNORMAL HIGH (ref 0.00–0.06)

## 2010-08-23 LAB — CREATININE, URINE, RANDOM: Creatinine, Urine: 109.8 mg/dL

## 2010-09-28 NOTE — Consult Note (Signed)
Adrian Frye, Adrian Frye NO.:  [id number]   MEDICAL RECORD NO.:  [id number]          PATIENT TYPE:  INP   LOCATION:  2603                         FACILITY:  [facility]   PHYSICIAN:  [name], M.D.     DATE OF BIRTH:  05/07/1971   DATE OF CONSULTATION:  11/10/2008  DATE OF DISCHARGE:                                 CONSULTATION   REFERRING PHYSICIAN:  Jacinto Reap, MD, in the ER   CHIEF COMPLAINT:  Chest pain and dyspnea.   HISTORY OF PRESENT ILLNESS:  This is a 41 year old morbidly obese  African American male who presents to the emergency room with a history  of dyspnea on exertion, progressive over 3 weeks associated with some  chest pressure and lower extremity edema.  Apparently, does have a  family history with his mother having CHF in her 3s.  Chest x-ray  showed mild interstitial prominence.  BNP was around 300.  Most notably,  he was found to have a creatinine around 3 with no history of renal  disease in the past.  He was also markedly hypertensive on presentation  to the emergency room.  We are now asked to consult.   PAST MEDICAL HISTORY:  1. Hypertension.  2. Morbid obesity.  3. Obstructive sleep apnea.   ALLERGIES:  None.   MEDICATIONS:  None.   SOCIAL HISTORY:  He is married and lives in [place].  He denies any  alcohol use, tobacco, or IV drug use.   FAMILY HISTORY:  His mother had a CHF in her 62s.  His father is  estranged.   REVIEW OF SYSTEMS:  Other than what is stated in the HPI is negative.   PHYSICAL EXAMINATION:  VITAL SIGNS:  Blood pressure is 195/135,  respirations 20, and pulse 102.  He is afebrile.  O2 saturations 94% on  room air.  HEENT:  Benign.  NECK:  Supple without lymphadenopathy.  Carotid upstrokes +2  bilaterally, no bruits.  LUNGS:  Clear to auscultation throughout.  HEART:  Regular rate and rhythm, tachycardiac.  No murmurs, rubs, or  gallops.  Normal S1 and S2.  ABDOMEN:  Morbidly obese, but benign.  EXTREMITIES:  Trace edema.   Chest x-ray shows mild interstitial prominence, which can be seen with  edema or pneumonia.   LABORATORIES:  Sodium 138, potassium 3.3, chloride 101, BUN 24,  creatinine 2.9, and glucose 155.  White cell count 10.7, hemoglobin  13.4, and hematocrit 40.4.  Point of care enzymes; myoglobin 440, normal  MB and troponin.  BNP 317.  EKG shows sinus tachycardia to 100 beats per  minute with no ST changes.   ASSESSMENT:  1. Dyspnea, probably multifactorial, secondary to morbid obesity;      obstructive sleep apnea; and hypertensive urgency.  2. Chest pressure, most likely secondary to hypertensive urgency.  3. Acute renal failure of unclear etiology.  4. Obesity.  5. Hypertensive emergency.  6. Obstructive sleep apnea.   PLAN:  1. Should cycle cardiac enzymes.  2. IV nitroglycerin to try to get systolic blood pressure less than  150.  3. Start Lopressor 25 mg q.6 h.  4. A 2-D echocardiogram to evaluate for structural heart disease      including LVH.  5. Consider renal consult.  6. Replete potassium.      [name], M.D.  Electronically Signed     TT/MEDQ  D:  11/11/2008  T:  11/11/2008  Job:  [id number]

## 2010-09-28 NOTE — Consult Note (Signed)
NAME:  Adrian Frye, Adrian Frye NO.:  [id number]   MEDICAL RECORD NO.:  [id number]          PATIENT TYPE:  INP   LOCATION:                               FACILITY:  [facility]   PHYSICIAN:  Windy Kalata, M.D.DATE OF BIRTH:  07-Nov-1970   DATE OF CONSULTATION:  11/12/2008  DATE OF DISCHARGE:                                 CONSULTATION   REASON FOR CONSULTATION:  Renal insufficiency.   REFERRING PHYSICIAN:  Domenic Polite, MD   HISTORY OF PRESENT ILLNESS:  This is a 40 year old black male, admitted  on October 21, 2008, for chest pain and shortness of breath.  The patient  denies any previous history of chronic kidney disease but has not seen a  doctor in years.  He denies any history of gross hematuria, kidney  stones, obstructive-type symptoms, or history of hypertension.  He  denies any recent nephrotoxins.  He only rarely takes nonsteroidal  medication.  He denies any systemic symptoms.  His mother was on  dialysis in the past, presumably related to diabetes.  A renal  ultrasound done during this hospitalization showed a right kidney of  12.3 and left kidney of 11.5 with increased echogenicity, and no  hydronephrosis.  He was taking no medicines prior to admission.  Urine  output was 1.6 liters yesterday.  Serum creatinine on admission was 2.9  on November 10, 2008, and 2.7 on November 11, 2008, and 3.3 on November 12, 2008.   PAST MEDICAL HISTORY:  Significant for obstructive sleep apnea  secondary, on CPAP and a history of obesity.  He has had a history  Achilles tendon repair.  He now has a new diagnosis of cardiomyopathy.  with an ejection fraction of 20-30%, left ventricular hypertrophy and a  new diagnosis now of hypertension.   ALLERGIES:  None.   CURRENT MEDICATIONS:  He is on no medications prior to admission but  current medications include:  1. Protonix 40 mg a day.  2. Aspirin 325 mg a day.  3. Lovenox 40 mg a day.  4. Apresoline 10 mg q.6 h.  5. Metoprolol 75 mg  a day.  6. Clonidine 0.2 mg t.i.d.   SOCIAL HISTORY:  He is half a pack per day smoker for 10 years.  He  drinks one drink per week.  He is married, lives with his wife.  He has  2 young children.  He works as Clinical cytogeneticist.   FAMILY HISTORY:  Mother had congestive heart failure and diabetes and  end-stage renal disease, presumably secondary to hypertension.  He does  not know anything about his father's health history.   REVIEW OF SYSTEMS:  Appetite is good.  Energy level good.  Currently, he  has no shortness of breath, no recent change in bowel habits, no new  skin lesions, no new arthritic complaints, no neuropathic symptoms.  Rest of review of systems unremarkable.   PHYSICAL EXAMINATION:  VITAL SIGNS:  Blood pressure 120/61, pulse is 72,  temperature 97.5.  GENERAL:  A 40 year old black male, in no acute distress.  HEENT:  Sclerae nonicteric.  Extraocular  muscles are intact.  NECK:  No bruits.  LUNGS:  A few faint bibasilar crackles.  HEART:  Regular rate and rhythm without murmur, rub, or gallop.  ABDOMEN:  Positive bowel sounds, nontender, nondistended, obese and no  overt organomegaly.  EXTREMITIES:  Trace to 1+ edema.  Pulses are 2/4 and equal throughout.  NEUROLOGIC:  Cranial nerves intact.  Motor and sensory intact.  No  asterixis.   LABORATORY DATA:  Sodium 131, potassium 3.6, bicarb 27, BUN 25,  creatinine 3.3, phosphorous 5.4.  Hemoglobin 11.5, platelet count  125,000.  Urinalysis positive for protein, 0-2 wbc's, 3-6 rbc's.   IMPRESSION:  1. I suspect chronic kidney disease, stage III, with hypertension      being at the top of the list.  Certainly, his obesity may be a      contributing factor as well.  2. Cardiomyopathy, most likely secondary to hypertension.  3. Obstructive sleep apnea.  4. Obesity.   RECOMMENDATIONS:  1. Check SPEP, intact PTH.  I discussed with him avoiding all over-the-      counter pain medications other than Tylenol.  We will  start Lasix      80 mg b.i.d.  We will check urine protein and creatinine ratio to      get an estimation of his 24-hour urine protein excretion.  I have      called Dr. Eugenio Hoes office to try and obtain records, but I have not      heard back from them.  I am not sure how successful this is going      to be as the patient says it has been years since he has seen any      doctor to have blood work done.  2. We will check daily serum creatinine.  3. Have dietician see him, discuss low-potassium, low-phosphorus diet.   Thank you very much for this consult.  I will follow up the patient with  you.      Windy Kalata, M.D.     MTM/MEDQ  D:  11/12/2008  T:  11/13/2008  Job:  [id number]

## 2010-09-28 NOTE — H&P (Signed)
NAME:  Adrian Frye, ARIAS NO.:  [id number]   MEDICAL RECORD NO.:  [id number]          PATIENT TYPE:  INP   LOCATION:  2603                         FACILITY:  [facility]   PHYSICIAN:  Domenic Polite, MD     DATE OF BIRTH:  08/23/1970   DATE OF ADMISSION:  11/10/2008  DATE OF DISCHARGE:                              HISTORY & PHYSICAL   PRIMARY CARE PHYSICIAN:  Ronaldo Miyamoto, MD   CHIEF COMPLAINT:  Chest pressure and shortness of breath.   Mr. Evanson is a 40 year old gentleman with morbid obesity who presents  today with progressive shortness of breath and chest tightness  intermittently for the past 2-3 weeks.  He describes chest tightness as  pressure like in the middle of his chest, worse with exertion but  improves with rest.  In addition, he has also had intermittent left  lateral chest pain at rest for the past few days, which lasts a few  seconds and resolves by itself.  He experienced that the last time  yesterday and none currently.  He went to the Urgent Care today and was  sent to the emergency department.  He denies any PND, but reports  history of orthopnea which is chronic.  No swelling of feet.  No fevers,  chills, cough, etc.   PAST MEDICAL HISTORY:  1. Hypertension.  2. Obstructive sleep apnea on CPAP.  3. Morbid obesity.   PAST SURGICAL HISTORY:  1. Ex lap as well as surgery for collapsed lung following a football      accident.  2. Achilles tendon surgery.   MEDICATIONS:  None.   ALLERGIES:  No known drug allergies.   SOCIAL HISTORY:  He is married, lives with his wife.  He does real  estate appraisal.  He is a former smoker, smoked about 5-6 cigarettes a  day for 10 years, quit 1 month ago.  Occasional alcohol, 1 drink per  week.   FAMILY HISTORY:  Significant for congestive heart failure in the mother,  deceased, and diabetes.   REVIEW OF SYSTEMS:  A 12-system review negative except per HPI.   PHYSICAL EXAMINATION:  VITAL SIGNS:  Temp is  97.8, pulse is 88 from 102,  blood pressure 186/116, respirations 20, sating 96% on 2 L.  GENERAL:  Morbidly obese African American gentleman leaning forward in  bed.  Alert, awake, oriented x3.  HEENT:  Pupils equal, reactive to light.  Moist mucous membranes.  NECK:  Obese.  JVD not appreciable.  CARDIOVASCULAR SYSTEM:  S1 and S2.  Regular rate and rhythm.  LUNGS:  Clear to auscultation bilaterally.  ABDOMEN:  Soft, nontender.  Positive bowel sounds.  EXTREMITIES:  Trace edema.  No clubbing or cyanosis.   LABORATORY DATA:  White count of 10.7, hemoglobin 13.9, and platelet  count clumped, sounds adequate.  Chemistry:  Sodium 138, potassium 3.3,  chloride 101, bicarb 29, BUN 24, creatinine 2.9, no prior baseline,  glucose 155.  CK 383, MB 2.7, index 0.7, troponin less than 0.05.  BNP  317.  Chest x-ray:  Mild interstitial prominence and indistinctiveness  can be  seen with edema or pneumonia.  EKG:  Sinus tachycardia, rate of  108, left axis deviation, no acute ST-T changes.   ASSESSMENT AND PLAN:  A 40 year old gentleman with:  1. Congestive heart failure and chest tightness/chest pain, which is      new onset.  We will check cardiac enzymes q.6 h. for 2 more sets,      get a 2-D echo.  We will give him a dose of Lasix 40 mg IV now.      Start him on aspirin 325 once a day.  In addition, Lopressor 25 mg      q.6 h.  Check his I's and O's, daily weights, low sodium diet, and      repeat EKG in a.m.  In addition, control blood pressure with IV      nitroglycerin drip.  2. Hypertensive emergency.  Continue IV nitroglycerin drip currently      at 6 mcg titrate for blood pressure less than [id number] systolic.  In      addition, use Lopressor 25 mg p.o. q.6 h.  3. Renal insufficiency, unknown baseline.  Check renal ultrasound,      FENa, get a urinalysis.  Check hemoglobin A1c and check CBGs.  In      addition, monitor I's and O's, chemistries.  If creatinine does not      improve or worsens  further, we will get a Renal consult.  4. Deep venous thrombosis prophylaxis.  Lovenox.  5. Obstructive sleep apnea.  Continue CPAP at 20 mmHg.  6. Code status.  The patient is a full code.      Domenic Polite, MD  Electronically Signed     PJ/MEDQ  D:  11/10/2008  T:  11/11/2008  Job:  [id number]

## 2010-09-28 NOTE — Discharge Summary (Signed)
NAME:  Adrian Frye, REEG NO.:  [id number]   MEDICAL RECORD NO.:  [id number]           PATIENT TYPE:   LOCATION:                                 FACILITY:   PHYSICIAN:  Kieth Brightly, MDDATE OF BIRTH:  1971/02/15   DATE OF ADMISSION:  DATE OF DISCHARGE:                               DISCHARGE SUMMARY   PRIMARY CARE PHYSICIAN:  Ronaldo Miyamoto, MD   CARDIOLOGY:  [name], MD of [facility].   NEPHROLOGY:  Windy Kalata, MD   REASON FOR ADMISSION:  Progressive shortness of breath and chest  pressure.   DISCHARGE DIAGNOSES:  1. Congestive heart failure with moderate systolic dysfunction of the      left ventricle with ejection fraction of 35%.  2. Chronic kidney disease, stage III-IV.  3. Hypertension.  4. Morbid obesity.  5. Obstructive sleep apnea.  6. Hypertension, currently controlled.  7. Constipation.  8. Diabetes mellitus.  9. 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   DISCHARGE MEDICATIONS:  1. Lasix 80 mg p.o. b.i.d.  2. BiDil 37.5/20 one tablet p.o. t.i.d.  3. Lopressor 75 mg p.o. b.i.d.  4. Colace 200 mg p.o. nightly.  5. Aspirin 325 mg p.o. daily.   HOSPITAL COURSE BY PROBLEM:  1. Congestive heart failure/hypertension:  The patient was admitted      with worsening shortness of breath and found to have clinical      congestive heart failure.  Cardiology consult was called, seen by      [facility] who suggested that the patient needs to be      initially evaluated for high blood pressure and for cardiac workup.      Acute myocardial infarction was ruled out.  Echocardiogram showed      an ejection fraction of 35-30% with diffuse hypokinesis with      moderate to severe systolic dysfunction.  There was no evidence of      aortic stenosis or mitral stenosis and no mitral regurgitation was      seen.  His pulmonary artery was poorly visualized due to poor      window, and there was a moderately dilated  left atrium.  Right      ventricular systolic function was normal, and cavity was normal in      size.  The patient was evaluated here where Cardiology has      suggested that the patient will eventually need cardiac      catheterization as outpatient and it is not needed at this moment.      Currently, blood pressure has been controlled with medications and      the patient is started on BiDil.  In view of chronic kidney      disease, he could not be started on ACE inhibitors.  The patient is      being discharged home.  2. Chronic kidney disease, stage III-IV:  His current creatinine level      is 3.58 that gives an estimated GFR of 23, so the patient has been      seen by Nephrology, Dr.  Mercy Moore, and he has been advised to      follow up as outpatient.  Dr. Etheleen Nicks office will call at his      house to make appointment.  He has been advised to follow renal      diet with low sodium and low potassium and low phosphorus.  3. Morbid obesity and obstructive sleep apnea:  The patient has been      continued on CPAP here, and he will be continued.  He was on the      CPAP at home, and he is being continued on the CPAP here.  He has      been advised to be adherent with CPAP.  4. Obesity:  The patient has been advised weight loss and water      restriction, daily weights in view of his heart failure diagnosis      also.  5. Constipation:  Continue Colace.  Currently, the patient's condition      is stable in regards to that.   DISPOSITION:  Discharge back home.   FOLLOWUP:  1. Follow up with Dr. Golden Hurter on November 28, 2008, at 1:45 p.m.  2. Follow up with Dr. Mercy Moore.  His office will call for      appointment.  3. Follow up with primary medical doctor, Dr. Wilson Singer, in a week to      check BMET.   INSTRUCTIONS:  Take renal diet, low-salt diet, potassium-contained food  until seen by renal doctor again.  Adhere to medications and CPAP.   A total of 40 minutes spent on this  discharge.      Kieth Brightly, MD  Electronically Signed     UT/MEDQ  D:  11/14/2008  T:  11/15/2008  Job:  [id number]   cc:   Ronaldo Miyamoto, M.D.  [name], M.D.  Windy Kalata, M.D.

## 2010-10-15 ENCOUNTER — Ambulatory Visit: Payer: Self-pay | Admitting: Internal Medicine

## 2010-12-16 ENCOUNTER — Telehealth: Payer: Self-pay | Admitting: Internal Medicine

## 2010-12-16 MED ORDER — ZOLPIDEM TARTRATE 10 MG PO TABS: 10.0000 mg | ORAL_TABLET | Freq: Every evening | ORAL | Status: DC | PRN

## 2010-12-16 NOTE — Telephone Encounter (Signed)
Pt requesting refill on ambien.  Pt last saw Cy on 10/16/09 and was told to f/u in 1 year which pt never did and does not have any pending appts with CY.  Pt was last given rx on 06/15/10 for # 30 x 5 refills.  Please advise if ok to give refills or not.  Thanks.

## 2010-12-16 NOTE — Telephone Encounter (Signed)
Pt returned call.  Advised of CDY's recs.  Pt verbalized his understanding > appt scheduled with CDY 9.6.12 @ 1530.  appt card mailed to pt's verified home address.  rx [facility].

## 2010-12-16 NOTE — Telephone Encounter (Signed)
Per CY ok to fill # 30 x 1 additional refill.  Pt needs to make and keep ov with Cy for additional refills.    [id number]

## 2011-01-20 ENCOUNTER — Ambulatory Visit (INDEPENDENT_AMBULATORY_CARE_PROVIDER_SITE_OTHER): Payer: 59 | Admitting: Internal Medicine

## 2011-01-20 ENCOUNTER — Encounter: Payer: Self-pay | Admitting: Internal Medicine

## 2011-01-20 VITALS — BP 124/76 | HR 78 | Ht 67.0 in | Wt 344.8 lb

## 2011-01-20 DIAGNOSIS — G4730 Sleep apnea, unspecified: Secondary | ICD-10-CM

## 2011-01-20 MED ORDER — ZOLPIDEM TARTRATE ER 12.5 MG PO TBCR: 12.5000 mg | EXTENDED_RELEASE_TABLET | Freq: Every evening | ORAL | Status: DC | PRN

## 2011-01-20 NOTE — Progress Notes (Signed)
  Subjective:    Patient ID: Adrian Frye, male    DOB: 31-May-1970, 40 y.o.   MRN: [id number]  HPI 01/20/11- 88 year old former smoker followed for obstructive sleep apnea complicated by obesity and HBP Last here October 16, 2009 Continues comfortable using CPAP 20/ Advanced all night every night, without CPAP concerns. He does ask for a longer lasting med than plain ambien 10 mg. Sleep onset is ok. No restless waking at night and no drug hangover.    Review of Systems Constitutional:   No-   weight loss, night sweats, fevers, chills, fatigue, lassitude. HEENT:   No-  headaches, difficulty swallowing, tooth/dental problems, sore throat,       No-  sneezing, itching, ear ache, nasal congestion, post nasal drip,  CV:  No-   chest pain, orthopnea, PND, swelling in lower extremities, anasarca, dizziness, palpitations Resp: No- acute  shortness of breath with exertion or at rest.              No-   productive cough,  No non-productive cough,  No-  coughing up of blood.              No-   change in color of mucus.  No- wheezing.   Skin: No-   rash or lesions. GI:  No-   heartburn, indigestion, abdominal pain, nausea, vomiting, diarrhea,                 change in bowel habits, loss of appetite GU: No-   dysuria, change in color of urine, no urgency or frequency.  No- flank pain. MS:  No-   joint pain or swelling.  No- decreased range of motion.  No- back pain. Neuro- grossly normal to observation, Or:  Psych:  No- change in mood or affect. No depression or anxiety.  No memory loss.      Objective:   Physical Exam General- Alert, Oriented, Affect-appropriate, Distress- none acute;   + morbidly obese Skin- rash-none, lesions- none, excoriation- none Lymphadenopathy- none Head- atraumatic            Eyes- Gross vision intact, PERRLA, conjunctivae clear secretions            Ears- Hearing, canals normal            Nose- mild turbinate edema, no-Septal dev, mucus, polyps, erosion, perforation             Throat- Mallampati III-IV, mucosa clear , drainage- none, tonsils- atrophic Neck- flexible , trachea midline, no stridor , thyroid nl, carotid no bruit Chest - symmetrical excursion , unlabored           Heart/CV- RRR , no murmur , no gallop  , no rub, nl s1 s2                           - JVD- none , edema- none, stasis changes- none, varices- none           Lung- clear to P&A, wheeze- none, cough- none , dullness-none, rub- none           Chest wall-  Abd- tender-no, distended-no, bowel sounds-present, HSM- no Br/ Gen/ Rectal- Not done, not indicated Extrem- cyanosis- none, clubbing, none, atrophy- none, strength- nl Neuro- grossly intact to observation         Assessment & Plan:

## 2011-01-20 NOTE — Assessment & Plan Note (Addendum)
Good compliance and control He is having chronic ongoing insomnia. Plan- try changing ambien to [place] cr

## 2011-01-20 NOTE — Patient Instructions (Signed)
Continue CPAP at 20   Please call as needed  Script for ambien-cr- should be similar to plain [place], but last longer

## 2011-01-23 ENCOUNTER — Encounter: Payer: Self-pay | Admitting: Internal Medicine

## 2011-07-28 ENCOUNTER — Other Ambulatory Visit (HOSPITAL_COMMUNITY): Payer: Self-pay | Admitting: Internal Medicine

## 2011-07-28 ENCOUNTER — Encounter (HOSPITAL_COMMUNITY)
Admission: RE | Admit: 2011-07-28 | Discharge: 2011-07-28 | Disposition: A | Discharge status: Home / Self Care | Payer: 59 | Source: Ambulatory Visit | Attending: Internal Medicine | Admitting: Internal Medicine

## 2011-07-28 DIAGNOSIS — R079 Chest pain, unspecified: Secondary | ICD-10-CM | POA: Insufficient documentation

## 2011-07-28 MED ORDER — TECHNETIUM TO 99M ALBUMIN AGGREGATED
3.0000 | Freq: Once | INTRAVENOUS | Status: AC | PRN
  Administered 2011-07-28: 3 via INTRAVENOUS

## 2012-01-24 ENCOUNTER — Telehealth: Payer: Self-pay | Admitting: Internal Medicine

## 2012-01-24 MED ORDER — ZOLPIDEM TARTRATE 10 MG PO TABS: ORAL_TABLET | ORAL | Status: DC

## 2012-01-24 NOTE — Telephone Encounter (Signed)
Called, spoke with pt.   I informed him of below per Dr. Annamaria Boots.  He verbalized understanding of this, is aware [facility], and will call back if having any problems with this change.  I called change in [facility]ien to [place].  She verbalized understanding of this.  Med list updated to reflect current change.

## 2012-01-24 NOTE — Telephone Encounter (Signed)
I spoke with pt and he stated his insurance is no longer going to cover the ambien 12.5 mg tablets. Per his pharmacy they will cover ambien 10 mg. Pt was just seen 01/20/12. Please advise Dr. Annamaria Boots thanks

## 2012-01-24 NOTE — Telephone Encounter (Signed)
Change script from ambien-CR to plain ambien 10 mg, # 30, 1 for sleep if needed, ref x 5.

## 2012-01-24 NOTE — Telephone Encounter (Signed)
Returning call can be reached at [phone].[name]

## 2012-01-24 NOTE — Telephone Encounter (Signed)
lmomtcb x1 for pt 

## 2012-07-26 ENCOUNTER — Other Ambulatory Visit: Payer: Self-pay | Admitting: Internal Medicine

## 2012-07-30 ENCOUNTER — Telehealth: Payer: Self-pay | Admitting: Internal Medicine

## 2012-07-30 MED ORDER — ZOLPIDEM TARTRATE 10 MG PO TABS: ORAL_TABLET | ORAL | Status: DC

## 2012-07-30 NOTE — Telephone Encounter (Signed)
Per CY-okay to refill this time ONLY; has pending OV and should keep this appt.

## 2012-07-30 NOTE — Telephone Encounter (Signed)
Rx has been called in. Pt is aware.

## 2012-07-30 NOTE — Telephone Encounter (Signed)
Last OV 01/20/11 Last fill 01/24/12 with 5 additional refills Pending OV 08/01/12  Please advise if this is okay to fill. Thanks.

## 2012-08-01 ENCOUNTER — Encounter: Payer: Self-pay | Admitting: Internal Medicine

## 2012-08-01 ENCOUNTER — Ambulatory Visit (INDEPENDENT_AMBULATORY_CARE_PROVIDER_SITE_OTHER): Payer: 59 | Admitting: Internal Medicine

## 2012-08-01 VITALS — BP 160/100 | HR 66 | Ht 66.5 in | Wt 343.8 lb

## 2012-08-01 DIAGNOSIS — G4733 Obstructive sleep apnea (adult) (pediatric): Secondary | ICD-10-CM

## 2012-08-01 MED ORDER — CLONAZEPAM 0.5 MG PO TABS: ORAL_TABLET | ORAL | Status: DC

## 2012-08-01 NOTE — Patient Instructions (Addendum)
Script for clonazepam - start with one per night, up to two if needed. Try taking it maybe 20 minutes before bedtime. This would replace ambien.   We can continue CPAP 20/ Advanced  Try saline nasal spray for the stuffy nose (otc).  Please call as needed

## 2012-08-01 NOTE — Progress Notes (Signed)
  Subjective:    Patient ID: Adrian Frye, male    DOB: January 14, 1971, 42 y.o.   MRN: [id number]  HPI 01/20/11- 42 year old former smoker followed for obstructive sleep apnea complicated by obesity and HBP Last here October 16, 2009 Continues comfortable using CPAP 20/ Advanced all night every night, without CPAP concerns. He does ask for a longer lasting med than plain ambien 10 mg. Sleep onset is ok. No restless waking at night and no drug hangover.   08/01/12-  42 year old former smoker followed for obstructive sleep apnea complicated by obesity and HBP FOLLOWS FOR: wears CPAP 20/ Advanced about 5 hours-unable to sleep-discuss other options(ambien 10 mg not working as well now) We reviewed sleep hygiene. Insurance would not pay for Ambien CR 12.5 mg He says he is working with diet and exercise to lose weight.  Review of Systems- see HPI Constitutional:   No-   weight loss, night sweats, fevers, chills, +fatigue, lassitude. HEENT:   No-  headaches, difficulty swallowing, tooth/dental problems, sore throat,       No-  sneezing, itching, ear ache, nasal congestion, post nasal drip,  CV:  No-   chest pain, orthopnea, PND, swelling in lower extremities, anasarca, dizziness, palpitations Resp: No- acute  shortness of breath with exertion or at rest.              No-   productive cough,  No non-productive cough,  No-  coughing up of blood.              No-   change in color of mucus.  No- wheezing.   Skin: No-   rash or lesions. GI:  No-   heartburn, indigestion, abdominal pain, nausea, vomiting,  GU:  MS:  No-   joint pain or swelling.   Neuro- nothing unusual he is Psych:  No- change in mood or affect. No depression or anxiety.  No memory loss.    Objective:   Physical Exam General- Alert, Oriented, Affect-appropriate, Distress- none acute;   + morbidly obese Skin- rash-none, lesions- none, excoriation- none Lymphadenopathy- none Head- atraumatic            Eyes- Gross vision intact,  PERRLA, conjunctivae clear secretions            Ears- Hearing, canals normal            Nose- mild turbinate edema, no-Septal dev, mucus, polyps, erosion, perforation             Throat- Mallampati III, mucosa clear , drainage- none, tonsils- atrophic Neck- flexible , trachea midline, no stridor , thyroid nl, carotid no bruit Chest - symmetrical excursion , unlabored           Heart/CV- RRR , no murmur , no gallop  , no rub, nl s1 s2                           - JVD- none , edema- none, stasis changes- none, varices- none           Lung- clear to P&A, wheeze- none, cough- none , dullness-none, rub- none           Chest wall-  Abd-  Br/ Gen/ Rectal- Not done, not indicated Extrem- cyanosis- none, clubbing, none, atrophy- none, strength- nl Neuro- grossly intact to observation  Assessment & Plan:

## 2012-08-05 ENCOUNTER — Encounter: Payer: Self-pay | Admitting: Internal Medicine

## 2012-08-05 NOTE — Assessment & Plan Note (Signed)
Pressure, compliance and control are appropriate. It would be better if he lost weight as discussed. Insomnia component with waking after sleep onset despite regular Ambien. Sleep hygiene education reinforced. Plan-try clonazepam

## 2012-09-16 ENCOUNTER — Other Ambulatory Visit: Payer: Self-pay | Admitting: Internal Medicine

## 2012-09-17 NOTE — Telephone Encounter (Signed)
Please advise if okay to refill. Thanks.  

## 2012-09-17 NOTE — Telephone Encounter (Signed)
Ok to refill clonazepam

## 2012-09-19 ENCOUNTER — Telehealth: Payer: Self-pay | Admitting: Internal Medicine

## 2012-09-19 NOTE — Telephone Encounter (Signed)
Please advise if okay to refill clonazepam 0.5 mg # 30 1-2 at bedtime prn Last given rx on 08/01/13 with no refills  Last ov 08/01/12 Next ov 11/01/12

## 2012-09-19 NOTE — Telephone Encounter (Signed)
Pt would like to know the status of this refill & asked to be reached at [phone].  Satira Anis

## 2012-09-19 NOTE — Telephone Encounter (Signed)
Called and left on voicemail at pharmacy.

## 2012-09-19 NOTE — Telephone Encounter (Signed)
Pt aware that CY had to approve refill and that I have called and left on voicemail at [facility] as Urgent. Nothing more needed at this time.

## 2012-10-19 ENCOUNTER — Other Ambulatory Visit: Payer: Self-pay | Admitting: Internal Medicine

## 2012-10-22 NOTE — Telephone Encounter (Signed)
Please advise if okay to refill. Thanks.  

## 2012-10-22 NOTE — Telephone Encounter (Signed)
Ok to refill 

## 2012-10-24 NOTE — Telephone Encounter (Signed)
Called to pharmacy voicemail.

## 2012-11-01 ENCOUNTER — Encounter: Payer: Self-pay | Admitting: Internal Medicine

## 2012-11-01 ENCOUNTER — Ambulatory Visit (INDEPENDENT_AMBULATORY_CARE_PROVIDER_SITE_OTHER): Payer: 59 | Admitting: Internal Medicine

## 2012-11-01 VITALS — BP 134/84 | HR 80 | Ht 66.5 in | Wt 360.4 lb

## 2012-11-01 DIAGNOSIS — G4733 Obstructive sleep apnea (adult) (pediatric): Secondary | ICD-10-CM

## 2012-11-01 NOTE — Patient Instructions (Addendum)
We can continue CPAP 20/ Advanced  We can continue clonazepam- call when needed

## 2012-11-01 NOTE — Progress Notes (Signed)
Subjective:    Patient ID: Adrian Frye, male    DOB: 03/04/1971, 42 y.o.   MRN: [id number]  HPI 01/20/11- 29 year old former smoker followed for obstructive sleep apnea complicated by obesity and HBP Last here October 16, 2009 Continues comfortable using CPAP 20/ Advanced all night every night, without CPAP concerns. He does ask for a longer lasting med than plain ambien 10 mg. Sleep onset is ok. No restless waking at night and no drug hangover.   08/01/12-  56 year old former smoker followed for obstructive sleep apnea complicated by obesity and HBP FOLLOWS FOR: wears CPAP 20/ Advanced about 5 hours-unable to sleep-discuss other options(ambien 10 mg not working as well now) We reviewed sleep hygiene. Insurance would not pay for Ambien CR 12.5 mg He says he is working with diet and exercise to lose weight.  11/01/12-  70 year old former smoker followed for obstructive sleep apnea complicated by obesity and HBP FOLLOWS FOR: wears CPAP20/ Advanced every night for about 5-6 hours; pressure working well for patient;  Clonazepam still working well 0.5 mgx2 tabs most nights.  Review of Systems- see HPI Constitutional:   No-   weight loss, night sweats, fevers, chills, fatigue, lassitude. HEENT:   No-  headaches, difficulty swallowing, tooth/dental problems, sore throat,       No-  sneezing, itching, ear ache, nasal congestion, post nasal drip,  CV:  No-   chest pain, orthopnea, PND, swelling in lower extremities, anasarca, dizziness, palpitations Resp: No- acute  shortness of breath with exertion or at rest.              No-   productive cough,  No non-productive cough,  No-  coughing up of blood.              No-   change in color of mucus.  No- wheezing.   Skin: No-   rash or lesions. GI:  No-   heartburn, indigestion, abdominal pain, nausea, vomiting,  GU:  MS:  No-   joint pain or swelling.   Neuro- nothing unusual  Psych:  No- change in mood or affect. No depression or anxiety.  No memory  loss.    Objective:   Physical Exam General- Alert, Oriented, Affect-appropriate, Distress- none acute;   + morbidly obese Skin- rash-none, lesions- none, excoriation- none Lymphadenopathy- none Head- atraumatic            Eyes- Gross vision intact, PERRLA, conjunctivae clear secretions            Ears- Hearing, canals normal            Nose- mild turbinate edema, no-Septal dev, mucus, polyps, erosion, perforation             Throat- Mallampati III, mucosa clear , drainage- none, tonsils- atrophic Neck- flexible , trachea midline, no stridor , thyroid nl, carotid no bruit Chest - symmetrical excursion , unlabored           Heart/CV- RRR , no murmur , no gallop  , no rub, nl s1 s2                           - JVD- none , edema- none, stasis changes- none, varices- none           Lung- clear to P&A, wheeze- none, cough- none , dullness-none, rub- none           Chest wall-  Abd-  Br/ Gen/ Rectal- Not  done, not indicated Extrem- cyanosis- none, clubbing, none, atrophy- none, strength- nl Neuro- grossly intact to observation  Assessment & Plan:

## 2012-11-17 ENCOUNTER — Encounter: Payer: Self-pay | Admitting: Internal Medicine

## 2012-11-17 NOTE — Assessment & Plan Note (Signed)
Weight loss would help most of his medical problems and is strongly encouraged.

## 2012-11-17 NOTE — Assessment & Plan Note (Signed)
Good compliance and control. Insomnia component managed with clonazepam.

## 2012-11-25 ENCOUNTER — Other Ambulatory Visit: Payer: Self-pay | Admitting: Internal Medicine

## 2012-11-28 ENCOUNTER — Telehealth: Payer: Self-pay | Admitting: Internal Medicine

## 2012-11-28 MED ORDER — CLONAZEPAM 0.5 MG PO TABS: ORAL_TABLET | ORAL | Status: DC

## 2012-11-28 NOTE — Telephone Encounter (Signed)
Last OV 10-29-12 with instruct to continue clonazepam. Last fill on 10-19-12 #30. Refill sent. Pt is aware. Malaga Bing, CMA

## 2013-02-28 ENCOUNTER — Other Ambulatory Visit: Payer: Self-pay | Admitting: Internal Medicine

## 2013-03-01 NOTE — Telephone Encounter (Signed)
Ok to refill 

## 2013-03-01 NOTE — Telephone Encounter (Signed)
Called refill to pharmacy voicemail.  

## 2013-03-01 NOTE — Telephone Encounter (Signed)
Please advise if okay to refill. Thanks.  

## 2013-03-18 ENCOUNTER — Encounter: Payer: 59 | Admitting: Cardiology

## 2013-03-18 ENCOUNTER — Encounter: Payer: Self-pay | Admitting: Cardiology

## 2013-03-18 DIAGNOSIS — I420 Dilated cardiomyopathy: Secondary | ICD-10-CM | POA: Insufficient documentation

## 2013-03-21 ENCOUNTER — Other Ambulatory Visit: Payer: Self-pay

## 2013-03-29 ENCOUNTER — Ambulatory Visit: Payer: 59 | Admitting: Cardiology

## 2013-04-16 ENCOUNTER — Encounter: Payer: Self-pay | Admitting: Cardiology

## 2013-04-17 ENCOUNTER — Ambulatory Visit (INDEPENDENT_AMBULATORY_CARE_PROVIDER_SITE_OTHER): Payer: 59 | Admitting: Cardiology

## 2013-04-17 ENCOUNTER — Encounter: Payer: Self-pay | Admitting: Cardiology

## 2013-04-17 VITALS — BP 181/96 | HR 63 | Ht 67.75 in | Wt 347.0 lb

## 2013-04-17 DIAGNOSIS — I10 Essential (primary) hypertension: Secondary | ICD-10-CM

## 2013-04-17 DIAGNOSIS — I5032 Chronic diastolic (congestive) heart failure: Secondary | ICD-10-CM

## 2013-04-17 DIAGNOSIS — R0602 Shortness of breath: Secondary | ICD-10-CM | POA: Insufficient documentation

## 2013-04-17 DIAGNOSIS — R609 Edema, unspecified: Secondary | ICD-10-CM

## 2013-04-17 MED ORDER — AMLODIPINE BESYLATE 10 MG PO TABS: 10.0000 mg | ORAL_TABLET | Freq: Every day | ORAL | Status: DC

## 2013-04-17 NOTE — Progress Notes (Signed)
[place], [address] [place], [place]  [address] Phone: [phone] Fax:  [phone]  Date:  04/17/2013   ID:  Adrian Frye, DOB 11/06/1970, MRN [id number]  PCP:  [name], MD  Cardiologist: [name], MD     History of Present Illness: Adrian Frye is a 42 y.o. male with a history of DCM, HTN and obesity.  His main complaint today is that of DOE.  He is fine walking on flat ground but when he goes to go up stairs.  He has been exercising  But recently started incorporating walking up stairs with his exercise.  He denies any chest pain.  He denies any LE edema, dizziness, palpitations or syncope.   Wt Readings from Last 3 Encounters:  04/17/13 347 lb (157.398 kg)  11/01/12 360 lb 6.4 oz (163.476 kg)  08/01/12 343 lb 12.8 oz (155.947 kg)     Past Medical History  Diagnosis Date  . Sleep apnea   . Allergic rhinitis   . Asthma   . Morbid obesity   . UGI bleed 2011    ASA  . Dilated idiopathic cardiomyopathy     now resoved with EF 55% by echo 2011  . Chronic kidney disease     stage III-IV  . Hyperparathyroidism, secondary   . Diabetes mellitus without complication   . Edema   . Hypertension     Current Outpatient Prescriptions  Medication Sig Dispense Refill  . amLODipine (NORVASC) 5 MG tablet Take 5 mg by mouth daily.        . carvedilol (COREG) 25 MG tablet Take 25 mg by mouth 2 (two) times daily.        . clonazePAM (KLONOPIN) 0.5 MG tablet TAKE 1-2 TABLETS BY MOUTH AS NEEDED FOR SLEEP  30 tablet  5  . furosemide (LASIX) 80 MG tablet Take 80 mg by mouth 2 (two) times daily.        Marland Kitchen glimepiride (AMARYL) 1 MG tablet Take 1 mg by mouth daily before breakfast.        . hydrALAZINE (APRESOLINE) 100 MG tablet Take 100 mg by mouth 3 (three) times daily.        Marland Kitchen lisinopril (PRINIVIL,ZESTRIL) 40 MG tablet Take 1 tablet by mouth 2 (two) times daily.       . Multiple Vitamin (MULTIVITAMIN) tablet Take 1 tablet by mouth daily.      . Omega-3 Fatty  Acids (FISH OIL) 1000 MG CAPS Take 1 capsule by mouth daily.      . simvastatin (ZOCOR) 20 MG tablet Take 1 tablet by mouth daily.       No current facility-administered medications for this visit.    Allergies:   No Known Allergies  Social History:  The patient  reports that he quit smoking about 3 years ago. His smoking use included Cigarettes. He smoked 0.00 packs per day. He does not have any smokeless tobacco history on file. He reports that he drinks about 1.2 ounces of alcohol per week.   Family History:  The patient's family history includes Diabetes in his mother; Heart disease in his father; Heart failure in his mother; Hypertension in his brother and mother.   ROS:  Please see the history of present illness.      All other systems reviewed and negative.   PHYSICAL EXAM: VS:  BP 181/96  Pulse 63  Ht 5' 7.75" (1.721 m)  Wt 347 lb (157.398 kg)  BMI 53.14 kg/m2 Well  nourished, well developed, in no acute distress HEENT: normal Neck: no JVD Cardiac:  normal S1, S2; RRR; no murmur Lungs:  clear to auscultation bilaterally, no wheezing, rhonchi or rales Abd: soft, nontender, no hepatomegaly Ext: no edema Skin: warm and dry Neuro:  CNs 2-12 intact, no focal abnormalities noted EKG:  NSR with no ST changes     ASSESSMENT AND PLAN:  1. DOE which I suspect is due to deconditioning with walking up stairs and his obesity.  I will get an ETT to rule out ischemia. 2. Edema with chronic diastolic CHF - appears euvolemic  - continue Lasix  - check BMET 3.   HTN - it is elevated today and at home 145-148/78-85  - continue Hydralazine/Lisinopril/Carvedilol  - increase Amlodipine to 10mg  daily  - Check Bp daily for a week and call with results  - I have asked him to call if he gets any LE edema after increasing dose of amlodipine  Followup with me in 6 months     Signed, [name], MD 04/17/2013 8:51 AM

## 2013-04-17 NOTE — Patient Instructions (Signed)
Your physician has recommended you make the following change in your medication: Increase amlodipine to 10 MG daily  Your physician recommends that you go have your BMET drawn at [facility] at [place] they are open 7:00 am until 6 pm. Their number is [phone]  Your physician has requested that you have an exercise tolerance test. For further information please visit [email or web address]. Please also follow instruction sheet, as given.  Please check your BP daily for one week adn call us with your results.   Your physician wants you to follow-up in: 6 Months with Dr Mallie Snooks will receive a reminder letter in the mail two months in advance. If you don't receive a letter, please call our office to schedule the follow-up appointment.

## 2013-04-30 ENCOUNTER — Encounter (HOSPITAL_COMMUNITY): Payer: 59

## 2013-05-07 ENCOUNTER — Ambulatory Visit (HOSPITAL_COMMUNITY)
Admission: RE | Admit: 2013-05-07 | Discharge: 2013-05-07 | Disposition: A | Discharge status: Home / Self Care | Payer: 59 | Source: Ambulatory Visit | Attending: Cardiology | Admitting: Cardiology

## 2013-05-07 DIAGNOSIS — R0602 Shortness of breath: Secondary | ICD-10-CM | POA: Insufficient documentation

## 2013-05-07 DIAGNOSIS — I10 Essential (primary) hypertension: Secondary | ICD-10-CM | POA: Insufficient documentation

## 2013-05-08 ENCOUNTER — Telehealth: Payer: Self-pay | Admitting: Cardiology

## 2013-05-08 ENCOUNTER — Telehealth: Payer: Self-pay | Admitting: General Surgery

## 2013-05-08 DIAGNOSIS — R0602 Shortness of breath: Secondary | ICD-10-CM

## 2013-05-08 NOTE — Telephone Encounter (Signed)
Pt did not reach target heart rate for ETT.Marland Kitchen Needs Two day Lexiscan

## 2013-05-08 NOTE — Telephone Encounter (Signed)
Message copied by Lily Kocher on Wed May 08, 2013 11:20 AM ------      Message from: [name] R      Created: Wed May 08, 2013  9:52 AM       Please let patient know that since he was unable to achieve his target HR that we will need to do a Lexiscan stress myoview - please set up - also please have him come in on Friday 12/26 to see nurse for BP check ------

## 2013-05-08 NOTE — Progress Notes (Signed)
LVM for pt to return call. Unable to make any Nurse visits on Friday 12/26. I have on hold a 10:00am BP nurse visit on Monday. Waiting on pt to return call.

## 2013-05-08 NOTE — Telephone Encounter (Signed)
New Problem:  PT is returning Adrian Frye's call. I tried calling you at 52, no answer. I see where u wanted him to set up nurse visit. I asked the pt if I could help him schedule that appt. Pt states he had questions for you. Pt requesting a call back.

## 2013-05-08 NOTE — Telephone Encounter (Signed)
Spoke with pt and made aware

## 2013-05-27 ENCOUNTER — Ambulatory Visit (HOSPITAL_COMMUNITY): Payer: 59 | Attending: Cardiology | Admitting: Radiology

## 2013-05-27 ENCOUNTER — Encounter: Payer: Self-pay | Admitting: Internal Medicine

## 2013-05-27 ENCOUNTER — Encounter: Payer: Self-pay | Admitting: Cardiology

## 2013-05-27 VITALS — BP 173/91 | HR 66 | Ht 68.0 in | Wt 350.0 lb

## 2013-05-27 DIAGNOSIS — I10 Essential (primary) hypertension: Secondary | ICD-10-CM | POA: Insufficient documentation

## 2013-05-27 DIAGNOSIS — R42 Dizziness and giddiness: Secondary | ICD-10-CM | POA: Insufficient documentation

## 2013-05-27 DIAGNOSIS — Z87891 Personal history of nicotine dependence: Secondary | ICD-10-CM | POA: Insufficient documentation

## 2013-05-27 DIAGNOSIS — R0989 Other specified symptoms and signs involving the circulatory and respiratory systems: Secondary | ICD-10-CM | POA: Insufficient documentation

## 2013-05-27 DIAGNOSIS — Z8249 Family history of ischemic heart disease and other diseases of the circulatory system: Secondary | ICD-10-CM | POA: Insufficient documentation

## 2013-05-27 DIAGNOSIS — R0602 Shortness of breath: Secondary | ICD-10-CM | POA: Insufficient documentation

## 2013-05-27 DIAGNOSIS — E119 Type 2 diabetes mellitus without complications: Secondary | ICD-10-CM | POA: Insufficient documentation

## 2013-05-27 DIAGNOSIS — E785 Hyperlipidemia, unspecified: Secondary | ICD-10-CM | POA: Insufficient documentation

## 2013-05-27 DIAGNOSIS — R0609 Other forms of dyspnea: Secondary | ICD-10-CM | POA: Insufficient documentation

## 2013-05-27 MED ORDER — REGADENOSON 0.4 MG/5ML IV SOLN
0.4000 mg | Freq: Once | INTRAVENOUS | Status: AC
  Administered 2013-05-27: 0.4 mg via INTRAVENOUS

## 2013-05-27 MED ORDER — TECHNETIUM TC 99M SESTAMIBI GENERIC - CARDIOLITE
30.0000 | Freq: Once | INTRAVENOUS | Status: AC | PRN
  Administered 2013-05-27: 30 via INTRAVENOUS

## 2013-05-27 NOTE — Progress Notes (Signed)
[facility] [address] [place], [place] [address] [phone]    Cardiology Nuclear Med Study  JAYCEION THERRIAULT is a 43 y.o. male     MRN : [id number]     DOB: [birth year]  Procedure Date: 05/27/2013  Nuclear Med Background Indication for Stress Test:  Evaluation for Ischemia and 05-07-13 ETT attempted, unable to reach target heart rate History:  No prior known history of CAD, Asthma, CHF,  and '13 Echo: EF=60% Cardiac Risk Factors: Family History - CAD, History of Smoking, Hypertension, Lipids and NIDDM  Symptoms:  Dizziness, DOE and SOB   Nuclear Pre-Procedure Caffeine/Decaff Intake:  None > 12 hrs NPO After: 7:30am   Lungs:  clear O2 Sat: 95% on room air. IV 0.9% NS with Angio Cath:  24g  IV Site: R Hand x 1, tolerated well IV Started by:  Irven Baltimore, RN  Chest Size (in):  52 Cup Size: n/a  Height: 5\' 8"  (1.727 m)  Weight:  350 lb (158.759 kg)  BMI:  Body mass index is 53.23 kg/(m^2). Tech Comments:  No medications today    Nuclear Med Study 1 or 2 day study: 2 day  Stress Test Type:  Lexiscan  Reading MD: N/A  Order Authorizing Provider:  [name], MD  Resting Radionuclide: Technetium 23m Sestamibi  Resting Radionuclide Dose: 33.0 mCi   On    06-06-13  Stress Radionuclide:  Technetium 70m Sestamibi  Stress Radionuclide Dose:33.0  MCi   On       05-27-13          Stress Protocol Rest HR: 66 Stress HR: 88  Rest BP: 173/91 Stress BP: 175/82  Exercise Time (min): n/a METS: n/a   Predicted Max HR: 178 bpm % Max HR: 49.44 bpm Rate Pressure Product: 15400   Dose of Adenosine (mg):  n/a Dose of Lexiscan: 0.4 mg  Dose of Atropine (mg): n/a Dose of Dobutamine: n/a mcg/kg/min (at max HR)  Stress Test Technologist: Irven Baltimore, RN  Nuclear Technologist:  Charlton Amor, CNMT     Rest Procedure:  Myocardial perfusion imaging was performed at rest 45 minutes following the intravenous administration of Technetium 46m Sestamibi. Rest  ECG: NSR - Normal EKG  Stress Procedure:  The patient received IV Lexiscan 0.4 mg over 15-seconds.  Technetium 33m Sestamibi injected at 30-seconds.The patient complained of stomach ache, but denied chest pain.  Quantitative spect images were obtained after a 45 minute delay. Stress ECG: No significant change from baseline ECG  QPS Raw Data Images:  Normal; no motion artifact; normal heart/lung ratio. Stress Images:  Normal homogeneous uptake in all areas of the myocardium. Rest Images:  Normal homogeneous uptake in all areas of the myocardium. Subtraction (SDS):  No evidence of ischemia. Transient Ischemic Dilatation (Normal <1.22):  1.18 Lung/Heart Ratio (Normal <0.45):  0.40  Quantitative Gated Spect Images QGS EDV:  NA QGS ESV:  NA  Impression Exercise Capacity:  Lexiscan with no exercise. BP Response:  Normal blood pressure response. Clinical Symptoms:  No chest pain. ECG Impression:  No significant ST segment change suggestive of ischemia. Comparison with Prior Nuclear Study: No previous nuclear study performed  Overall Impression:  Normal stress nuclear study.  Not gated. LV function can be assessed by echo if clinically indicated.  LV Ejection Fraction: Study not gated.  LV Wall Motion: NA  Adrian Frye

## 2013-05-29 ENCOUNTER — Other Ambulatory Visit: Payer: Self-pay | Admitting: Internal Medicine

## 2013-05-29 NOTE — Telephone Encounter (Signed)
CY-please advise if okay to refill. Thanks.  

## 2013-05-29 NOTE — Telephone Encounter (Signed)
Ok to refill 6 months 

## 2013-05-30 NOTE — Telephone Encounter (Signed)
Called refill to pharmacy voicemail.  

## 2013-06-06 ENCOUNTER — Encounter: Payer: Self-pay | Admitting: Cardiovascular Disease

## 2013-06-06 ENCOUNTER — Ambulatory Visit (HOSPITAL_COMMUNITY): Payer: 59 | Attending: Interventional Cardiology

## 2013-06-06 DIAGNOSIS — R0989 Other specified symptoms and signs involving the circulatory and respiratory systems: Secondary | ICD-10-CM

## 2013-06-06 MED ORDER — TECHNETIUM TC 99M SESTAMIBI GENERIC - CARDIOLITE
30.0000 | Freq: Once | INTRAVENOUS | Status: AC | PRN
  Administered 2013-06-06: 30 via INTRAVENOUS

## 2013-06-07 ENCOUNTER — Telehealth: Payer: Self-pay | Admitting: General Surgery

## 2013-06-07 DIAGNOSIS — R0602 Shortness of breath: Secondary | ICD-10-CM

## 2013-06-07 NOTE — Telephone Encounter (Signed)
LVM to make pt aware. ECHO ordered for pt. Told pt someone would be calling pt to schedule.

## 2013-06-07 NOTE — Telephone Encounter (Signed)
Message copied by Lily Kocher on Fri Jun 07, 2013  3:53 PM ------      Message from: [name] R      Created: Fri Jun 07, 2013  2:18 PM       Please order an echo to assess LVF - could not gate stress test ------

## 2013-06-13 ENCOUNTER — Telehealth: Payer: Self-pay | Admitting: *Deleted

## 2013-06-13 NOTE — Telephone Encounter (Signed)
06/13/13 I have left several messages for Adrian Frye to call and schedule his test.

## 2013-06-19 ENCOUNTER — Encounter: Payer: Self-pay | Admitting: Cardiology

## 2013-06-19 ENCOUNTER — Ambulatory Visit (HOSPITAL_COMMUNITY): Payer: 59 | Attending: Cardiology | Admitting: Cardiology

## 2013-06-19 DIAGNOSIS — R0989 Other specified symptoms and signs involving the circulatory and respiratory systems: Principal | ICD-10-CM | POA: Insufficient documentation

## 2013-06-19 DIAGNOSIS — I509 Heart failure, unspecified: Secondary | ICD-10-CM

## 2013-06-19 DIAGNOSIS — R609 Edema, unspecified: Secondary | ICD-10-CM

## 2013-06-19 DIAGNOSIS — I5032 Chronic diastolic (congestive) heart failure: Secondary | ICD-10-CM

## 2013-06-19 DIAGNOSIS — R0602 Shortness of breath: Secondary | ICD-10-CM

## 2013-06-19 DIAGNOSIS — E119 Type 2 diabetes mellitus without complications: Secondary | ICD-10-CM | POA: Insufficient documentation

## 2013-06-19 DIAGNOSIS — Z87891 Personal history of nicotine dependence: Secondary | ICD-10-CM | POA: Insufficient documentation

## 2013-06-19 DIAGNOSIS — I10 Essential (primary) hypertension: Secondary | ICD-10-CM | POA: Insufficient documentation

## 2013-06-19 DIAGNOSIS — I420 Dilated cardiomyopathy: Secondary | ICD-10-CM

## 2013-06-19 DIAGNOSIS — I428 Other cardiomyopathies: Secondary | ICD-10-CM

## 2013-06-19 DIAGNOSIS — R0609 Other forms of dyspnea: Secondary | ICD-10-CM | POA: Insufficient documentation

## 2013-06-19 NOTE — Progress Notes (Signed)
Echo performed. 

## 2013-07-29 ENCOUNTER — Telehealth: Payer: Self-pay | Admitting: Internal Medicine

## 2013-07-29 DIAGNOSIS — G4733 Obstructive sleep apnea (adult) (pediatric): Secondary | ICD-10-CM

## 2013-07-29 NOTE — Telephone Encounter (Signed)
lmtcb x1 Has not been seen since 07/2012.

## 2013-07-29 NOTE — Telephone Encounter (Signed)
Spoke with pt.  His last OV with Dr. Annamaria Boots was on November 01, 2012.   He was asked to f/u in 1 yr. He has a pending OV with [facility] on November 01, 2013. Pt states [facility] needs order to supply him with new tubing, mask, reservoir, and filters. I spoke with Katie.  This is ok.  Order has been placed.  Pt aware.

## 2013-09-01 ENCOUNTER — Other Ambulatory Visit: Payer: Self-pay | Admitting: Internal Medicine

## 2013-09-02 NOTE — Telephone Encounter (Signed)
Ok to refill 

## 2013-09-02 NOTE — Telephone Encounter (Signed)
CY, please advise if okay to refill. Pt has pending OV 10-2013. Thanks.

## 2013-09-03 ENCOUNTER — Other Ambulatory Visit: Payer: Self-pay | Admitting: *Deleted

## 2013-09-03 MED ORDER — HYDRALAZINE HCL 100 MG PO TABS: 100.0000 mg | ORAL_TABLET | Freq: Three times a day (TID) | ORAL | Status: DC

## 2013-09-05 NOTE — Telephone Encounter (Signed)
RX has been called in

## 2013-09-18 ENCOUNTER — Other Ambulatory Visit: Payer: Self-pay | Admitting: Internal Medicine

## 2013-09-19 NOTE — Telephone Encounter (Signed)
Last OV: 11-01-12 Pending OV: 11-01-13  CY, please advise if okay to refill until next OV. Thanks.

## 2013-09-19 NOTE — Telephone Encounter (Signed)
Ok to refill 

## 2013-09-24 ENCOUNTER — Telehealth: Payer: Self-pay | Admitting: Internal Medicine

## 2013-09-24 MED ORDER — CLONAZEPAM 0.5 MG PO TABS: ORAL_TABLET | ORAL | Status: DC

## 2013-09-24 NOTE — Telephone Encounter (Signed)
Spoke with pt and he states that he is needing refills on Clonzepam.  Spoke with pharmacist at [facility] and she states that pt is out of refills.  Refills called in.  Pt aware

## 2013-10-26 ENCOUNTER — Other Ambulatory Visit: Payer: Self-pay | Admitting: Cardiology

## 2013-11-01 ENCOUNTER — Ambulatory Visit: Payer: 59 | Admitting: Internal Medicine

## 2013-11-25 ENCOUNTER — Other Ambulatory Visit: Payer: Self-pay | Admitting: Cardiology

## 2013-11-26 ENCOUNTER — Ambulatory Visit: Payer: 59 | Admitting: Internal Medicine

## 2013-12-03 ENCOUNTER — Other Ambulatory Visit: Payer: Self-pay | Admitting: Cardiology

## 2014-01-12 ENCOUNTER — Other Ambulatory Visit: Payer: Self-pay | Admitting: Cardiology

## 2014-01-14 ENCOUNTER — Ambulatory Visit: Payer: 59 | Admitting: Internal Medicine

## 2014-01-17 ENCOUNTER — Ambulatory Visit (INDEPENDENT_AMBULATORY_CARE_PROVIDER_SITE_OTHER): Payer: BC Managed Care – PPO | Admitting: Cardiology

## 2014-01-17 ENCOUNTER — Encounter: Payer: Self-pay | Admitting: Cardiology

## 2014-01-17 ENCOUNTER — Telehealth: Payer: Self-pay | Admitting: Cardiology

## 2014-01-17 VITALS — BP 150/86 | HR 80 | Ht 67.0 in | Wt 351.0 lb

## 2014-01-17 DIAGNOSIS — I428 Other cardiomyopathies: Secondary | ICD-10-CM

## 2014-01-17 DIAGNOSIS — I5032 Chronic diastolic (congestive) heart failure: Secondary | ICD-10-CM

## 2014-01-17 DIAGNOSIS — G4733 Obstructive sleep apnea (adult) (pediatric): Secondary | ICD-10-CM

## 2014-01-17 DIAGNOSIS — I420 Dilated cardiomyopathy: Secondary | ICD-10-CM

## 2014-01-17 DIAGNOSIS — R609 Edema, unspecified: Secondary | ICD-10-CM

## 2014-01-17 DIAGNOSIS — I10 Essential (primary) hypertension: Secondary | ICD-10-CM

## 2014-01-17 LAB — BASIC METABOLIC PANEL
BUN: 34 mg/dL — ABNORMAL HIGH (ref 6–23)
CO2: 25 mEq/L (ref 19–32)
Calcium: 8.5 mg/dL (ref 8.4–10.5)
Chloride: 99 mEq/L (ref 96–112)
Creatinine, Ser: 2.8 mg/dL — ABNORMAL HIGH (ref 0.4–1.5)
GFR: 31.41 mL/min — ABNORMAL LOW (ref >60.00)
Glucose, Bld: 149 mg/dL — ABNORMAL HIGH (ref 70–99)
Potassium: 3.3 mEq/L — ABNORMAL LOW (ref 3.5–5.1)
Sodium: 136 mEq/L (ref 135–145)

## 2014-01-17 NOTE — Telephone Encounter (Signed)
[employer] will cover ambien, xanax, zyprexa, tofranil, and olanzapine, for sleep   Which would you prescribe?

## 2014-01-17 NOTE — Progress Notes (Signed)
[place] [place], [place]  [address] Phone: [phone] Fax:  [phone]  Date:  01/17/2014   ID:  Adrian Frye, DOB [birth year], MRN [id number]  PCP:  [name], MD  Sleep Medicine:  [name], MD     History of Present Illness: Adrian Frye is a 43 y.o. male with a history of DCM, HTN, OSA and obesity.  He has been doing well.  He denies any chest pain, SOB, DOE, dizziness, palpitations or syncope. He tolerates his CPAP well.  He uses a nasal mask with no chin strap which he tolerates well.  He dose occasionally have a dry throat.  He has a problem staying asleep after 4am.  He goes to bed around 10.  He feels rested when he gets up in the am but then feels like he needs to lay back down again.   Wt Readings from Last 3 Encounters:  01/17/14 351 lb (159.213 kg)  05/27/13 350 lb (158.759 kg)  04/17/13 347 lb (157.398 kg)     Past Medical History  Diagnosis Date  . Sleep apnea   . Allergic rhinitis   . Asthma   . Morbid obesity   . UGI bleed 2011    ASA  . Dilated idiopathic cardiomyopathy     now resoved with EF 55% by echo 2011  . Chronic kidney disease     stage III-IV  . Hyperparathyroidism, secondary   . Diabetes mellitus without complication   . Edema   . Hypertension     Current Outpatient Prescriptions  Medication Sig Dispense Refill  . amLODipine (NORVASC) 10 MG tablet Take 1 tablet (10 mg total) by mouth daily.  30 tablet  11  . carvedilol (COREG) 25 MG tablet Take 25 mg by mouth 2 (two) times daily.        . clonazePAM (KLONOPIN) 0.5 MG tablet TAKE 1-2 TABLETS BY MOUTH AT BEDTIME AS NEEDED  60 tablet  3  . furosemide (LASIX) 80 MG tablet Take 80 mg by mouth 2 (two) times daily.        Marland Kitchen glimepiride (AMARYL) 1 MG tablet Take 1 mg by mouth daily before breakfast.        . hydrALAZINE (APRESOLINE) 100 MG tablet TAKE 1 TABLET (100 MG TOTAL) BY MOUTH 3 (THREE) TIMES DAILY.  90 tablet  0  . losartan (COZAAR) 50 MG tablet       .  Multiple Vitamin (MULTIVITAMIN) tablet Take 1 tablet by mouth daily.      . Omega-3 Fatty Acids (FISH OIL) 1000 MG CAPS Take 1 capsule by mouth daily.      . predniSONE (DELTASONE) 10 MG tablet       . simvastatin (ZOCOR) 20 MG tablet Take 1 tablet by mouth daily.       No current facility-administered medications for this visit.    Allergies:   No Known Allergies  Social History:  The patient  reports that he quit smoking about 4 years ago. His smoking use included Cigarettes. He smoked 0.00 packs per day. He does not have any smokeless tobacco history on file. He reports that he drinks about 1.2 ounces of alcohol per week.   Family History:  The patient's family history includes Diabetes in his mother; Heart disease in his father; Heart failure in his mother; Hypertension in his brother and mother.   ROS:  Please see the history of present illness.      All other  systems reviewed and negative.   PHYSICAL EXAM: VS:  BP 150/86  Pulse 80  Ht 5\' 7"  (1.702 m)  Wt 351 lb (159.213 kg)  BMI 54.96 kg/m2 Well nourished, well developed, in no acute distress HEENT: normal Neck: no JVD Cardiac:  normal S1, S2; RRR; no murmur Lungs:  clear to auscultation bilaterally, no wheezing, rhonchi or rales Abd: soft, nontender, no hepatomegaly Ext: no edema Skin: warm and dry Neuro:  CNs 2-12 intact, no focal abnormalities noted      ASSESSMENT AND PLAN:  1. Chronic diastolic CHF appears euvolemic on exam - continue Lasix/BB/ARB - check BMET        2.   HTN - it is elevated today but he has not taken his BP meds this am.  His BP runs 140/35mmHg at home. - continue Hydralazine/Losartan/Carvedilol/amlodipine        3.  OSA on CPAP and tolerating well.  I will get a d/l from his DME       4.  Morbid obesity - I have encouraged him to continue exercise as his ankle pain will allow        Followup with me in 6 months    Signed, [name], MD 01/17/2014 8:37 AM

## 2014-01-17 NOTE — Telephone Encounter (Signed)
New problem   Pt was told by Dr Radford Pax to call back about his sleeping medication list that Dewy Rose will cover. Please call pt.

## 2014-01-17 NOTE — Telephone Encounter (Signed)
lmtrc

## 2014-01-17 NOTE — Patient Instructions (Signed)
Continue current CPAP/BiPAP Settings.  Your physician recommends that you continue on your current medications as directed. Please refer to the Current Medication list given to you today.  Your physician recommends that you go to the lab today for a BMET  Your physician wants you to follow-up in: 6 months with Dr Mallie Snooks will receive a reminder letter in the mail two months in advance. If you don't receive a letter, please call our office to schedule the follow-up appointment.

## 2014-01-20 NOTE — Telephone Encounter (Signed)
Ok to call in Ambien 5mg  qhs PRN for sleep.  Quantity #15 with 1 refill

## 2014-01-21 ENCOUNTER — Other Ambulatory Visit: Payer: Self-pay | Admitting: General Surgery

## 2014-01-21 MED ORDER — ZOLPIDEM TARTRATE 5 MG PO TABS: 5.0000 mg | ORAL_TABLET | Freq: Every evening | ORAL | Status: DC | PRN

## 2014-01-21 NOTE — Telephone Encounter (Signed)
Signed and called pt to make aware ok to pick up from office. Pt is aware and will come in to pick up

## 2014-01-21 NOTE — Telephone Encounter (Signed)
Rx printed for Dr Radford Pax to sign. Will put up front for pt to pick up after she signs.

## 2014-01-21 NOTE — Telephone Encounter (Signed)
rx printed for pt and will have Dr Radford Pax sign.

## 2014-01-22 ENCOUNTER — Telehealth: Payer: Self-pay | Admitting: Cardiology

## 2014-01-22 NOTE — Telephone Encounter (Signed)
Follow up:    Pt returning this office call please give him a call back.

## 2014-01-22 NOTE — Telephone Encounter (Signed)
Spoke to pt and verified dosage of Klor con he is taking and added to med list.

## 2014-01-31 ENCOUNTER — Other Ambulatory Visit: Payer: Self-pay | Admitting: General Surgery

## 2014-01-31 DIAGNOSIS — Z79899 Other long term (current) drug therapy: Secondary | ICD-10-CM

## 2014-02-03 ENCOUNTER — Telehealth: Payer: Self-pay | Admitting: Cardiology

## 2014-02-03 NOTE — Telephone Encounter (Signed)
New problem ° ° °Pt returning your call from last week °

## 2014-02-03 NOTE — Telephone Encounter (Signed)
lmtrc

## 2014-02-04 ENCOUNTER — Other Ambulatory Visit: Payer: Self-pay | Admitting: General Surgery

## 2014-02-04 DIAGNOSIS — Z79899 Other long term (current) drug therapy: Secondary | ICD-10-CM

## 2014-02-04 NOTE — Telephone Encounter (Signed)
He he still taking the Klonipin with the Ambien?

## 2014-02-04 NOTE — Telephone Encounter (Signed)
Pt called to say that he can not use the rx for the sleep med Dr Radford Pax prescribed. He stated it is not strong enough. Plus he has to use it every night. He cant just use it 4 night weekly. He only has one pill left so he wants to know what else to do. He can not sleep without the pill and also it is not working long enough. He states he wakes up at 4 and cant go back to sleep after that.

## 2014-02-05 MED ORDER — ZOLPIDEM TARTRATE 10 MG PO TABS: 10.0000 mg | ORAL_TABLET | Freq: Every evening | ORAL | Status: DC | PRN

## 2014-02-05 NOTE — Telephone Encounter (Signed)
Pt states he is just using the Ambien he is not using the Klonopin.

## 2014-02-05 NOTE — Telephone Encounter (Signed)
Printed and signed by Dr Radford Pax. Will Fax to pts pharmacy for him

## 2014-02-05 NOTE — Telephone Encounter (Signed)
Ok to increase Ambien to 10mg  qhs PRN but I only want him using it 4 times weekly at most.

## 2014-02-07 ENCOUNTER — Other Ambulatory Visit (INDEPENDENT_AMBULATORY_CARE_PROVIDER_SITE_OTHER): Payer: BC Managed Care – PPO

## 2014-02-07 DIAGNOSIS — Z79899 Other long term (current) drug therapy: Secondary | ICD-10-CM

## 2014-02-07 LAB — BASIC METABOLIC PANEL
BUN: 31 mg/dL — ABNORMAL HIGH (ref 6–23)
CO2: 29 mEq/L (ref 19–32)
Calcium: 8.5 mg/dL (ref 8.4–10.5)
Chloride: 101 mEq/L (ref 96–112)
Creatinine, Ser: 3.1 mg/dL — ABNORMAL HIGH (ref 0.4–1.5)
GFR: 28.17 mL/min — ABNORMAL LOW (ref >60.00)
Glucose, Bld: 104 mg/dL — ABNORMAL HIGH (ref 70–99)
Potassium: 3.3 mEq/L — ABNORMAL LOW (ref 3.5–5.1)
Sodium: 138 mEq/L (ref 135–145)

## 2014-02-12 ENCOUNTER — Other Ambulatory Visit: Payer: Self-pay | Admitting: Cardiology

## 2014-02-13 ENCOUNTER — Encounter: Payer: Self-pay | Admitting: General Surgery

## 2014-02-18 ENCOUNTER — Other Ambulatory Visit: Payer: Self-pay | Admitting: Cardiology

## 2014-02-20 NOTE — Telephone Encounter (Signed)
Ok to fill 

## 2014-02-20 NOTE — Telephone Encounter (Signed)
  Refill on 02/05/14 shows 15 tablets with 3 refills.

## 2014-02-20 NOTE — Telephone Encounter (Signed)
How long ago was his last refill

## 2014-02-20 NOTE — Telephone Encounter (Signed)
Ok to refill x 1 but please instruct patient to only take as needed up to 4 times weekly

## 2014-02-21 ENCOUNTER — Other Ambulatory Visit (INDEPENDENT_AMBULATORY_CARE_PROVIDER_SITE_OTHER): Payer: BC Managed Care – PPO | Admitting: *Deleted

## 2014-02-21 DIAGNOSIS — Z79899 Other long term (current) drug therapy: Secondary | ICD-10-CM

## 2014-02-21 LAB — BASIC METABOLIC PANEL
BUN: 33 mg/dL — ABNORMAL HIGH (ref 6–23)
CO2: 28 mEq/L (ref 19–32)
Calcium: 8.5 mg/dL (ref 8.4–10.5)
Chloride: 102 mEq/L (ref 96–112)
Creatinine, Ser: 2.9 mg/dL — ABNORMAL HIGH (ref 0.4–1.5)
GFR: 31.14 mL/min — ABNORMAL LOW (ref >60.00)
Glucose, Bld: 98 mg/dL (ref 70–99)
Potassium: 3.3 mEq/L — ABNORMAL LOW (ref 3.5–5.1)
Sodium: 138 mEq/L (ref 135–145)

## 2014-02-21 MED ORDER — ZOLPIDEM TARTRATE 10 MG PO TABS: 10.0000 mg | ORAL_TABLET | Freq: Every evening | ORAL | Status: DC | PRN

## 2014-02-21 NOTE — Telephone Encounter (Signed)
Rx called into pharmacy. Pt aware.

## 2014-02-21 NOTE — Telephone Encounter (Signed)
I would like him to see his PCP and talk to them about it

## 2014-02-21 NOTE — Addendum Note (Signed)
Addended byUlla Potash H on: 02/21/2014 03:01 PM   Modules accepted: Orders

## 2014-02-21 NOTE — Telephone Encounter (Signed)
Pt notified. I spoke with Dr. Radford Pax and pt can have a 30 day supply only until he sees PCP for future refills.

## 2014-02-21 NOTE — Telephone Encounter (Signed)
lmtrc

## 2014-02-21 NOTE — Telephone Encounter (Addendum)
Spoke with pt and he trys to go without medication and he will only get 1-2 hours of sleep a night. He states he needs the medication every night. Can we refill or does he need to go see another doctor to refill?

## 2014-02-28 ENCOUNTER — Other Ambulatory Visit: Payer: Self-pay

## 2014-02-28 ENCOUNTER — Other Ambulatory Visit: Payer: Self-pay | Admitting: *Deleted

## 2014-02-28 DIAGNOSIS — E876 Hypokalemia: Secondary | ICD-10-CM

## 2014-03-03 ENCOUNTER — Ambulatory Visit: Payer: 59 | Admitting: Internal Medicine

## 2014-03-04 ENCOUNTER — Telehealth: Payer: Self-pay

## 2014-03-04 ENCOUNTER — Encounter: Payer: Self-pay | Admitting: Internal Medicine

## 2014-03-04 NOTE — Telephone Encounter (Signed)
Pt st he will not schedule a lab appointment because it is inconvenient with his job and his son's school schedule. He st he is having lab work done in a couple weeks at another doctor's office and will have them send it over when it is resulted.

## 2014-03-22 ENCOUNTER — Other Ambulatory Visit: Payer: Self-pay | Admitting: Cardiology

## 2014-03-25 NOTE — Telephone Encounter (Signed)
I would like patient to see his PCP regarding needing so much Ambien.  As I told him at his OV, I do not want him taking Ambien every night it is very addictive

## 2014-03-27 NOTE — Telephone Encounter (Signed)
Rose,   Please see Dr. Theodosia Blender note on this encounter- need to obtain through PCP.

## 2014-03-27 NOTE — Telephone Encounter (Signed)
See Dr. Theodosia Blender notes.

## 2014-05-13 ENCOUNTER — Encounter: Payer: Self-pay | Admitting: Cardiology

## 2014-05-13 ENCOUNTER — Other Ambulatory Visit: Payer: Self-pay | Admitting: Cardiology

## 2014-05-13 MED ORDER — AMLODIPINE BESYLATE 10 MG PO TABS: 10.0000 mg | ORAL_TABLET | Freq: Every day | ORAL | Status: DC

## 2014-05-14 ENCOUNTER — Other Ambulatory Visit: Payer: Self-pay | Admitting: *Deleted

## 2014-05-14 MED ORDER — AMLODIPINE BESYLATE 10 MG PO TABS: 10.0000 mg | ORAL_TABLET | Freq: Every day | ORAL | Status: DC

## 2014-05-15 ENCOUNTER — Other Ambulatory Visit: Payer: Self-pay | Admitting: *Deleted

## 2014-06-15 ENCOUNTER — Other Ambulatory Visit: Payer: Self-pay | Admitting: Cardiology

## 2014-07-14 ENCOUNTER — Other Ambulatory Visit: Payer: Self-pay | Admitting: Cardiology

## 2014-07-22 ENCOUNTER — Encounter: Payer: Self-pay | Admitting: Cardiology

## 2014-08-08 ENCOUNTER — Other Ambulatory Visit: Payer: Self-pay | Admitting: Cardiology

## 2014-08-12 ENCOUNTER — Other Ambulatory Visit: Payer: Self-pay | Admitting: Cardiology

## 2014-08-30 ENCOUNTER — Other Ambulatory Visit: Payer: Self-pay | Admitting: Cardiology

## 2014-10-14 ENCOUNTER — Ambulatory Visit: Payer: Self-pay | Admitting: Cardiology

## 2014-11-12 ENCOUNTER — Other Ambulatory Visit: Payer: Self-pay | Admitting: Cardiology

## 2014-12-18 ENCOUNTER — Ambulatory Visit (INDEPENDENT_AMBULATORY_CARE_PROVIDER_SITE_OTHER): Payer: BLUE CROSS/BLUE SHIELD | Admitting: Cardiology

## 2014-12-18 ENCOUNTER — Encounter: Payer: Self-pay | Admitting: Cardiology

## 2014-12-18 VITALS — BP 162/78 | HR 74 | Ht 67.0 in | Wt 343.8 lb

## 2014-12-18 DIAGNOSIS — I5032 Chronic diastolic (congestive) heart failure: Secondary | ICD-10-CM

## 2014-12-18 DIAGNOSIS — I10 Essential (primary) hypertension: Secondary | ICD-10-CM

## 2014-12-18 DIAGNOSIS — G4733 Obstructive sleep apnea (adult) (pediatric): Secondary | ICD-10-CM | POA: Diagnosis not present

## 2014-12-18 NOTE — Patient Instructions (Signed)

## 2014-12-18 NOTE — Progress Notes (Signed)
Cardiology Office Note   Date:  12/18/2014   ID:  Adrian Frye, DOB 1970/11/05, MRN [id number]  PCP:  [name], MD    Chief Complaint  Patient presents with  . Follow-up    chronic diastolic heart failure      History of Present Illness: Adrian Frye is a 44 y.o. male with a history of DCM, HTN, OSA and obesity. He has been doing well. He denies any chest pain, SOB, DOE, dizziness, palpitations or syncope. Occasionally has some LE edema when sitting at his computer.  He tolerates his CPAP well. He uses a nasal mask with no chin strap which he tolerates well. He dose occasionally have a dry throat. He feels rested when he gets up in the am and has no daytime sleepiness.  He has lost 7 lbs and is walking for exercise.    Past Medical History  Diagnosis Date  . Sleep apnea   . Allergic rhinitis   . Asthma   . Morbid obesity   . UGI bleed 2011    ASA  . Dilated idiopathic cardiomyopathy     now resoved with EF 55% by echo 2011  . Chronic kidney disease     stage III-IV  . Hyperparathyroidism, secondary   . Diabetes mellitus without complication   . Edema   . Hypertension     Past Surgical History  Procedure Laterality Date  . Tonsillectomy    . Pleural scarification      left, football trauma-chest tube  . Achilles tendon repair      ruptured; right     Current Outpatient Prescriptions  Medication Sig Dispense Refill  . allopurinol (ZYLOPRIM) 300 MG tablet Take 300 mg by mouth daily.  6  . amLODipine (NORVASC) 5 MG tablet Take 5 mg by mouth daily.    . calcitRIOL (ROCALTROL) 0.25 MCG capsule Take 0.25 mcg by mouth daily.  6  . carvedilol (COREG) 25 MG tablet Take 25 mg by mouth 2 (two) times daily.      . colchicine 0.6 MG tablet Take 0.6 mg by mouth as needed (for gout).   5  . furosemide (LASIX) 80 MG tablet Take 80 mg by mouth 2 (two) times daily.      . hydrALAZINE (APRESOLINE) 100 MG tablet TAKE 1 TABLET (100 MG TOTAL)  BY MOUTH 3 (THREE) TIMES DAILY. 90 tablet 1  . Multiple Vitamin (MULTIVITAMIN) tablet Take 1 tablet by mouth daily.    . potassium chloride SA (K-DUR,KLOR-CON) 20 MEQ tablet Take one tablet by mouth three times a day    . simvastatin (ZOCOR) 20 MG tablet Take 1 tablet by mouth daily.    . valsartan (DIOVAN) 160 MG tablet Take 160 mg by mouth 2 (two) times daily.  6  . zolpidem (AMBIEN) 10 MG tablet Take 1 tablet (10 mg total) by mouth at bedtime as needed for sleep. 30 tablet 0   No current facility-administered medications for this visit.    Allergies:   Review of patient's allergies indicates no known allergies.    Social History:  The patient  reports that he quit smoking about 5 years ago. His smoking use included Cigarettes. He does not have any smokeless tobacco history on file. He reports that he drinks about 1.2 oz of alcohol per week.   Family History:  The patient's family history includes Diabetes in his mother;  Heart disease in his father; Heart failure in his mother; Hypertension in his brother and mother.    ROS:  Please see the history of present illness.   Otherwise, review of systems are positive for none.   All other systems are reviewed and negative.    PHYSICAL EXAM: VS:  BP 162/78 mmHg  Pulse 74  Ht 5\' 7"  (1.702 m)  Wt 343 lb 12.8 oz (155.947 kg)  BMI 53.83 kg/m2 , BMI Body mass index is 53.83 kg/(m^2). GEN: Well nourished, well developed, in no acute distress HEENT: normal Neck: no JVD, carotid bruits, or masses Cardiac: RRR; no murmurs, rubs, or gallops,no edema  Respiratory:  clear to auscultation bilaterally, normal work of breathing GI: soft, nontender, nondistended, + BS MS: no deformity or atrophy Skin: warm and dry, no rash Neuro:  Strength and sensation are intact Psych: euthymic mood, full affect   EKG:  EKG was ordered today and shows NSR with no ST changes    Recent Labs: 02/21/2014: BUN 33*; Creatinine, Ser 2.9*; Potassium 3.3*; Sodium 138      Lipid Panel No results found for: CHOL, TRIG, HDL, CHOLHDL, VLDL, LDLCALC, LDLDIRECT    Wt Readings from Last 3 Encounters:  12/18/14 343 lb 12.8 oz (155.947 kg)  01/17/14 351 lb (159.213 kg)  05/27/13 350 lb (158.759 kg)    ASSESSMENT AND PLAN:  1. Chronic diastolic CHF appears euvolemic on exam - continue Lasix/BB/ARB - check BMET   2. HTN - borderline controlled - at home it runs 138-145/77-37mmHg. - continue Hydralazine/Valsartan/Carvedilol/amlodipine   3. OSA on CPAP and tolerating well. I will get a d/l from his DME  4. Morbid obesity - I have encouraged him to continue exercise    Current medicines are reviewed at length with the patient today.  The patient does not have concerns regarding medicines.  The following changes have been made:  no change  Labs/ tests ordered today: See above Assessment and Plan No orders of the defined types were placed in this encounter.     Disposition:   FU with me in 6 months  Signed, Sueanne Margarita, MD  12/18/2014 3:58 PM    [facility] [address], [place], [place]  [address] Phone: [phone]; Fax: [phone]

## 2015-01-07 ENCOUNTER — Other Ambulatory Visit: Payer: Self-pay | Admitting: Cardiology

## 2015-01-09 ENCOUNTER — Other Ambulatory Visit: Payer: Self-pay | Admitting: Cardiology

## 2015-01-09 MED ORDER — AMLODIPINE BESYLATE 5 MG PO TABS: 5.0000 mg | ORAL_TABLET | Freq: Every day | ORAL | Status: DC

## 2015-01-09 NOTE — Telephone Encounter (Signed)
Should the patient be taking 5mg  or 10mg ? Please advise. Thanks, MI

## 2015-01-09 NOTE — Telephone Encounter (Signed)
ok 

## 2015-01-09 NOTE — Telephone Encounter (Signed)
Spoke with pt and he states that he has been taking Amlodipine 5mg  QD. Pt states that he had a bunch of 10mg  at home and was cutting them in half.

## 2015-01-09 NOTE — Telephone Encounter (Signed)
Last OV note a year ago said he was taking 10mg  and this past office note stated 5mg .  Please call patient and verify what he is taking

## 2015-02-10 ENCOUNTER — Other Ambulatory Visit: Payer: Self-pay | Admitting: Cardiology

## 2015-03-19 ENCOUNTER — Encounter: Payer: Self-pay | Admitting: Cardiology

## 2015-06-29 ENCOUNTER — Other Ambulatory Visit: Payer: Self-pay | Admitting: Cardiology

## 2015-06-29 MED ORDER — AMLODIPINE BESYLATE 5 MG PO TABS: 5.0000 mg | ORAL_TABLET | Freq: Every day | ORAL | Status: DC

## 2015-08-11 NOTE — Progress Notes (Signed)
Cardiology Office Note   Date:  08/12/2015   ID:  Adrian Frye, DOB [birth year], MRN [id number]  PCP:  [name], MD    Chief Complaint  Patient presents with  . Congestive Heart Failure  . Cardiomyopathy  . Hypertension      History of Present Illness: Adrian Frye is a 45 y.o. male with a history of DCM, HTN, OSA and obesity. He has been doing well. He denies any chest pain, SOB, DOE, dizziness, palpitations or syncope. Occasionally has some LE edema when sitting at his computer. He tolerates his CPAP well. He uses a nasal mask with no chin strap which he tolerates well. He dose occasionally have a dry throat. He feels rested when he gets up in the am and has no daytime sleepiness    Past Medical History  Diagnosis Date  . Sleep apnea   . Allergic rhinitis   . Asthma   . Morbid obesity ([place])   . UGI bleed 2011    ASA  . Dilated idiopathic cardiomyopathy ([place])     now resoved with EF 55% by echo 2011  . Chronic kidney disease     stage III-IV  . Hyperparathyroidism, secondary ([place])   . Diabetes mellitus without complication ([place])   . Edema   . Hypertension     Past Surgical History  Procedure Laterality Date  . Tonsillectomy    . Pleural scarification      left, football trauma-chest tube  . Achilles tendon repair      ruptured; right     Current Outpatient Prescriptions  Medication Sig Dispense Refill  . allopurinol (ZYLOPRIM) 300 MG tablet Take 300 mg by mouth daily.  6  . amLODipine (NORVASC) 5 MG tablet Take 1 tablet (5 mg total) by mouth daily. 30 tablet 7  . calcitRIOL (ROCALTROL) 0.25 MCG capsule 0.25 mcg. Alternate 2 tablets by mouth with 1 tablet daily.  6  . carvedilol (COREG) 25 MG tablet Take 25 mg by mouth 2 (two) times daily.      . colchicine 0.6 MG tablet Take 0.6 mg by mouth as needed (for gout).   5  . furosemide (LASIX) 80 MG tablet Take 80 mg by mouth 2 (two) times daily.      . hydrALAZINE  (APRESOLINE) 100 MG tablet TAKE 1 TABLET BY MOUTH 3 TIMES A DAY 90 tablet 5  . Multiple Vitamin (MULTIVITAMIN) tablet Take 1 tablet by mouth daily.    . potassium chloride SA (K-DUR,KLOR-CON) 20 MEQ tablet Take 20 mEq by mouth 2 (two) times daily. Take one tablet by mouth three times a day    . simvastatin (ZOCOR) 20 MG tablet Take 1 tablet by mouth daily.    . valsartan (DIOVAN) 160 MG tablet Take 160 mg by mouth 2 (two) times daily.  6  . zolpidem (AMBIEN) 10 MG tablet Take 1 tablet (10 mg total) by mouth at bedtime as needed for sleep. 30 tablet 0   No current facility-administered medications for this visit.    Allergies:   Review of patient's allergies indicates no known allergies.    Social History:  The patient  reports that he quit smoking about 6 years ago. His smoking use included Cigarettes. He does not have any smokeless tobacco history on file. He reports that he drinks about 1.2 oz of alcohol per week.   Family History:  The  patient's family history includes Diabetes in his mother; Heart disease in his father; Heart failure in his mother; Hypertension in his brother and mother.    ROS:  Please see the history of present illness.   Otherwise, review of systems are positive for none.   All other systems are reviewed and negative.    PHYSICAL EXAM: VS:  BP 146/79 mmHg  Pulse 76  Ht 5\' 7"  (1.702 m)  Wt 357 lb (161.934 kg)  BMI 55.90 kg/m2 , BMI Body mass index is 55.9 kg/(m^2). GEN: Well nourished, well developed, in no acute distress HEENT: normal Neck: no JVD, carotid bruits, or masses Cardiac: RRR; no murmurs, rubs, or gallops,no edema  Respiratory:  clear to auscultation bilaterally, normal work of breathing GI: soft, nontender, nondistended, + BS MS: no deformity or atrophy Skin: warm and dry, no rash Neuro:  Strength and sensation are intact Psych: euthymic mood, full affect   EKG:  EKG is not ordered today.    Recent Labs: No results found for requested  labs within last 365 days.    Lipid Panel No results found for: CHOL, TRIG, HDL, CHOLHDL, VLDL, LDLCALC, LDLDIRECT    Wt Readings from Last 3 Encounters:  08/12/15 357 lb (161.934 kg)  12/18/14 343 lb 12.8 oz (155.947 kg)  01/17/14 351 lb (159.213 kg)     ASSESSMENT AND PLAN:  1. Chronic diastolic CHF appears euvolemic on exam - continue Lasix/BB/ARB - check BMET   2. HTN - borderline controlled.  At home his BP runs 145/70-80's.  He has not taken his med today.   - continue Hydralazine/Valsartan/Carvedilol/amlodipine   3. OSA on CPAP and tolerating well. I will get a d/l from his DME  4. Morbid obesity - I have encouraged him to continue exercise   Current medicines are reviewed at length with the patient today.  The patient does not have concerns regarding medicines.  The following changes have been made:  no change  Labs/ tests ordered today: See above Assessment and Plan No orders of the defined types were placed in this encounter.     Disposition:   FU with me in 1 year  Signed, Sueanne Margarita, MD  08/12/2015 3:46 PM    [facility] [place], [place], [place]  [address] Phone: [phone]; Fax: [phone]

## 2015-08-12 ENCOUNTER — Ambulatory Visit (INDEPENDENT_AMBULATORY_CARE_PROVIDER_SITE_OTHER): Payer: BLUE CROSS/BLUE SHIELD | Admitting: Cardiology

## 2015-08-12 ENCOUNTER — Encounter: Payer: Self-pay | Admitting: Cardiology

## 2015-08-12 VITALS — BP 146/79 | HR 76 | Ht 67.0 in | Wt 357.0 lb

## 2015-08-12 DIAGNOSIS — G4733 Obstructive sleep apnea (adult) (pediatric): Secondary | ICD-10-CM

## 2015-08-12 DIAGNOSIS — I420 Dilated cardiomyopathy: Secondary | ICD-10-CM | POA: Diagnosis not present

## 2015-08-12 DIAGNOSIS — I10 Essential (primary) hypertension: Secondary | ICD-10-CM | POA: Diagnosis not present

## 2015-08-12 DIAGNOSIS — I5032 Chronic diastolic (congestive) heart failure: Secondary | ICD-10-CM | POA: Diagnosis not present

## 2015-08-12 NOTE — Patient Instructions (Signed)
Medication Instructions:  Timeline for your cardiac medications:   To be taken in the morning when you wake up:   Norvasc  Hydralazine   Coreg  Diovan To be taken around 2:00PM:   2nd dose of hydralazine To be taken in the evening after dinner:  2nd dose of Coreg  2nd dose of Diovan  3rd dose of Hydralazine   Labwork: TODAY: BMET  Testing/Procedures: None  Follow-Up: Your physician wants you to follow-up in: 1 year with Dr. Radford Pax. You will receive a reminder letter in the mail two months in advance. If you don't receive a letter, please call our office to schedule the follow-up appointment.   Any Other Special Instructions Will Be Listed Below (If Applicable).     If you need a refill on your cardiac medications before your next appointment, please call your pharmacy.

## 2015-08-14 ENCOUNTER — Other Ambulatory Visit: Payer: Self-pay | Admitting: Cardiology

## 2015-09-21 ENCOUNTER — Encounter (INDEPENDENT_AMBULATORY_CARE_PROVIDER_SITE_OTHER): Payer: BLUE CROSS/BLUE SHIELD | Admitting: Ophthalmology

## 2015-09-21 DIAGNOSIS — H43813 Vitreous degeneration, bilateral: Secondary | ICD-10-CM | POA: Diagnosis not present

## 2015-09-21 DIAGNOSIS — I10 Essential (primary) hypertension: Secondary | ICD-10-CM | POA: Diagnosis not present

## 2015-09-21 DIAGNOSIS — E11311 Type 2 diabetes mellitus with unspecified diabetic retinopathy with macular edema: Secondary | ICD-10-CM

## 2015-09-21 DIAGNOSIS — E113313 Type 2 diabetes mellitus with moderate nonproliferative diabetic retinopathy with macular edema, bilateral: Secondary | ICD-10-CM

## 2015-09-21 DIAGNOSIS — H2513 Age-related nuclear cataract, bilateral: Secondary | ICD-10-CM

## 2015-09-21 DIAGNOSIS — H35033 Hypertensive retinopathy, bilateral: Secondary | ICD-10-CM | POA: Diagnosis not present

## 2015-11-23 ENCOUNTER — Encounter (INDEPENDENT_AMBULATORY_CARE_PROVIDER_SITE_OTHER): Payer: BLUE CROSS/BLUE SHIELD | Admitting: Ophthalmology

## 2015-12-24 ENCOUNTER — Encounter (INDEPENDENT_AMBULATORY_CARE_PROVIDER_SITE_OTHER): Payer: BLUE CROSS/BLUE SHIELD | Admitting: Ophthalmology

## 2015-12-24 DIAGNOSIS — E11311 Type 2 diabetes mellitus with unspecified diabetic retinopathy with macular edema: Secondary | ICD-10-CM | POA: Diagnosis not present

## 2015-12-24 DIAGNOSIS — H35033 Hypertensive retinopathy, bilateral: Secondary | ICD-10-CM

## 2015-12-24 DIAGNOSIS — E113391 Type 2 diabetes mellitus with moderate nonproliferative diabetic retinopathy without macular edema, right eye: Secondary | ICD-10-CM | POA: Diagnosis not present

## 2015-12-24 DIAGNOSIS — I10 Essential (primary) hypertension: Secondary | ICD-10-CM | POA: Diagnosis not present

## 2015-12-24 DIAGNOSIS — E113312 Type 2 diabetes mellitus with moderate nonproliferative diabetic retinopathy with macular edema, left eye: Secondary | ICD-10-CM

## 2015-12-24 DIAGNOSIS — H43813 Vitreous degeneration, bilateral: Secondary | ICD-10-CM | POA: Diagnosis not present

## 2016-02-22 ENCOUNTER — Encounter: Payer: Self-pay | Admitting: Cardiology

## 2016-03-09 NOTE — Progress Notes (Signed)
This encounter was created in error - please disregard.  This encounter was created in error - please disregard.

## 2016-06-30 ENCOUNTER — Ambulatory Visit (INDEPENDENT_AMBULATORY_CARE_PROVIDER_SITE_OTHER): Payer: BLUE CROSS/BLUE SHIELD | Admitting: Ophthalmology

## 2016-07-01 ENCOUNTER — Emergency Department (HOSPITAL_COMMUNITY)
Admission: EM | Admit: 2016-07-01 | Discharge: 2016-07-02 | Disposition: A | Discharge status: Home / Self Care | Payer: BLUE CROSS/BLUE SHIELD | Attending: Emergency Medicine | Admitting: Emergency Medicine

## 2016-07-01 ENCOUNTER — Ambulatory Visit
Admission: RE | Admit: 2016-07-01 | Discharge: 2016-07-01 | Disposition: A | Discharge status: Home / Self Care | Payer: BLUE CROSS/BLUE SHIELD | Source: Ambulatory Visit | Attending: Internal Medicine | Admitting: Internal Medicine

## 2016-07-01 ENCOUNTER — Other Ambulatory Visit: Payer: Self-pay | Admitting: Internal Medicine

## 2016-07-01 ENCOUNTER — Encounter (HOSPITAL_COMMUNITY): Payer: Self-pay

## 2016-07-01 DIAGNOSIS — Z87891 Personal history of nicotine dependence: Secondary | ICD-10-CM | POA: Diagnosis not present

## 2016-07-01 DIAGNOSIS — E1122 Type 2 diabetes mellitus with diabetic chronic kidney disease: Secondary | ICD-10-CM | POA: Insufficient documentation

## 2016-07-01 DIAGNOSIS — I132 Hypertensive heart and chronic kidney disease with heart failure and with stage 5 chronic kidney disease, or end stage renal disease: Secondary | ICD-10-CM | POA: Diagnosis not present

## 2016-07-01 DIAGNOSIS — R109 Unspecified abdominal pain: Secondary | ICD-10-CM

## 2016-07-01 DIAGNOSIS — N185 Chronic kidney disease, stage 5: Secondary | ICD-10-CM | POA: Insufficient documentation

## 2016-07-01 DIAGNOSIS — I5032 Chronic diastolic (congestive) heart failure: Secondary | ICD-10-CM | POA: Insufficient documentation

## 2016-07-01 DIAGNOSIS — R944 Abnormal results of kidney function studies: Secondary | ICD-10-CM | POA: Diagnosis not present

## 2016-07-01 DIAGNOSIS — Z79899 Other long term (current) drug therapy: Secondary | ICD-10-CM | POA: Diagnosis not present

## 2016-07-01 DIAGNOSIS — J45909 Unspecified asthma, uncomplicated: Secondary | ICD-10-CM | POA: Insufficient documentation

## 2016-07-01 LAB — COMPREHENSIVE METABOLIC PANEL
ALT: 12 U/L — ABNORMAL LOW (ref 17–63)
AST: 14 U/L — ABNORMAL LOW (ref 15–41)
Albumin: 3.8 g/dL (ref 3.5–5.0)
Alkaline Phosphatase: 55 U/L (ref 38–126)
Anion gap: 16 — ABNORMAL HIGH (ref 5–15)
BUN: 58 mg/dL — ABNORMAL HIGH (ref 6–20)
CO2: 28 mmol/L (ref 22–32)
Calcium: 9.2 mg/dL (ref 8.9–10.3)
Chloride: 92 mmol/L — ABNORMAL LOW (ref 101–111)
Creatinine, Ser: 7.38 mg/dL — ABNORMAL HIGH (ref 0.61–1.24)
GFR calc Af Amer: 9 mL/min — ABNORMAL LOW (ref >60)
GFR calc non Af Amer: 8 mL/min — ABNORMAL LOW (ref >60)
Glucose, Bld: 123 mg/dL — ABNORMAL HIGH (ref 65–99)
Potassium: 3 mmol/L — ABNORMAL LOW (ref 3.5–5.1)
Sodium: 136 mmol/L (ref 135–145)
Total Bilirubin: 1 mg/dL (ref 0.3–1.2)
Total Protein: 7.3 g/dL (ref 6.5–8.1)

## 2016-07-01 LAB — CBC WITH DIFFERENTIAL/PLATELET
Basophils Absolute: 0 10*3/uL (ref 0.0–0.1)
Basophils Relative: 0 %
Eosinophils Absolute: 0.1 10*3/uL (ref 0.0–0.7)
Eosinophils Relative: 1 %
HCT: 29.1 % — ABNORMAL LOW (ref 39.0–52.0)
Hemoglobin: 9.2 g/dL — ABNORMAL LOW (ref 13.0–17.0)
Lymphocytes Relative: 13 %
Lymphs Abs: 1.8 10*3/uL (ref 0.7–4.0)
MCH: 26.7 pg (ref 26.0–34.0)
MCHC: 31.6 g/dL (ref 30.0–36.0)
MCV: 84.6 fL (ref 78.0–100.0)
Monocytes Absolute: 1.3 10*3/uL — ABNORMAL HIGH (ref 0.1–1.0)
Monocytes Relative: 9 %
Neutro Abs: 11 10*3/uL — ABNORMAL HIGH (ref 1.7–7.7)
Neutrophils Relative %: 78 %
Platelets: 143 10*3/uL — ABNORMAL LOW (ref 150–400)
RBC: 3.44 MIL/uL — ABNORMAL LOW (ref 4.22–5.81)
RDW: 14.8 % (ref 11.5–15.5)
WBC: 14.1 10*3/uL — ABNORMAL HIGH (ref 4.0–10.5)

## 2016-07-01 MED ORDER — SODIUM CHLORIDE 0.9 % IV BOLUS (SEPSIS): 1000.0000 mL | Freq: Once | INTRAVENOUS | Status: DC

## 2016-07-01 NOTE — ED Notes (Signed)
Pt state he was able to go to the restroom about 30 mins ago and feels much better . No more pressure in his abdomen. Had seen his primary care physician and was advised to take milk of magnesium and has made a difference in his pain.

## 2016-07-01 NOTE — ED Provider Notes (Signed)
By signing my name below, I, Delton Prairie, attest that this documentation has been prepared under the direction and in the presence of Ezequiel Essex, MD  Electronically Signed: Delton Prairie, ED Scribe. 07/01/16. 12:33 AM.  Blood pressure (!) 122/52, pulse 70, temperature 99.4 F (37.4 C), temperature source Oral, resp. rate 16, SpO2 96 %.  HPI Comments:  Adrian Frye is a 46 y.o. male who presents to the Emergency Department complaining of right flank pain and abdominal pain onset 2 days. He also reports constipation, normal urinary output and a decrease in appetite. He notes his pain completely resolved after a bowel movement since arriving to the ED. Pt denies testicular pain, hx of kidney stones, hx of abdominal surgeries, nausea, vomiting, hx of similar pain, current pain or any other associated symptoms. Pt is followed by a nephrologist. He was seen at his PCP's office today where he labs drawn. Pt was called back by PCP, told his Scr was ~7 and advised to visit the ED.  PE: Well appearing, obese. Abdomen non tender. Lungs CTAB. Heart is RRR  Cr increased to 7.4 from 6.5 in November. Denies symptoms.  Refuses IVF.  D/w nephrology.  I personally performed the services described in this documentation, which was scribed in my presence. The recorded information has been reviewed and is accurate.    Ezequiel Essex, MD 07/02/16 2111

## 2016-07-01 NOTE — ED Provider Notes (Signed)
[facility]-EMERGENCY DEPT Provider Note   CSN: [id number] Arrival date & time: 07/01/16  1937   History   Chief Complaint Chief Complaint  Patient presents with  . Abnormal Lab    HPI Adrian Frye is a 46 y.o. male who presents with elevated SCr. PMH significant for CKD stage V, HTN, hx of CHF (EF 55%), morbid obesity, diet controlled Type 2 DM, OSA on CPAP. He states that two days ago he started to have right sided flank and abdominal pain. It was constant. He reports associated constipation and decreased appetite since eating made his pain worse. Denies fever, chills, chest pain, SOB, N/V/D, dysuria. He states that he went to his PCP's office today and they did and acute CXR and abdominal xray which showed increased stool burden. He had labs drawn and was given milk of magnesia. He was called back and told to come to the ED since his SCr was ~7. Since he's been in the ED he had a large BM which has significantly relieved his abdominal pain. Of note his BUN/SCr on 11/3 was 75/6.55 (eGFR was 11) and on 10/4 was 45/5.12 (eGFR was 15). Nephrologist is Dr. Briant Cedar.  HPI  Past Medical History:  Diagnosis Date  . Allergic rhinitis   . Asthma   . Chronic kidney disease    stage III-IV  . Diabetes mellitus without complication ([facility])   . Dilated idiopathic cardiomyopathy ([facility])    now resoved with EF 55% by echo 2011  . Edema   . Hyperparathyroidism, secondary ([facility])   . Hypertension   . Morbid obesity ([facility])   . Sleep apnea   . UGI bleed 2011   ASA    Patient Active Problem List   Diagnosis Date Noted  . Chronic diastolic heart failure ([facility]) 04/17/2013  . Edema   . Hypertension   . Dilated idiopathic cardiomyopathy ([facility])   . Morbid obesity ([facility]) 08/05/2012  . ASTHMA 10/28/2007  . ALLERGIC RHINITIS 10/18/2007  . Obstructive sleep apnea 10/18/2007    Past Surgical History:  Procedure Laterality Date  . ACHILLES TENDON REPAIR     ruptured; right  . PLEURAL SCARIFICATION     left, football trauma-chest tube  . TONSILLECTOMY         Home Medications    Prior to Admission medications   Medication Sig Start Date End Date Taking? Authorizing Provider  allopurinol (ZYLOPRIM) 300 MG tablet Take 300 mg by mouth daily after lunch.  11/25/14  Yes Historical Provider, MD  calcitRIOL (ROCALTROL) 0.25 MCG capsule Take 0.5 mcg by mouth daily after lunch.  11/25/14  Yes Historical Provider, MD  carvedilol (COREG) 25 MG tablet Take 25 mg by mouth 2 (two) times daily.     Yes Historical Provider, MD  co-enzyme Q-10 30 MG capsule Take 30 mg by mouth daily.   Yes Historical Provider, MD  colchicine 0.6 MG tablet Take 0.6 mg by mouth daily as needed (for gout).  10/09/14  Yes Historical Provider, MD  Digestive Enzyme CAPS Take 2 capsules by mouth daily. From [employer]   Yes Historical Provider, MD  ferrous sulfate 325 (65 FE) MG tablet Take 650 mg by mouth daily with breakfast.   Yes Historical Provider, MD  furosemide (LASIX) 80 MG tablet Take 160 mg by mouth See admin instructions. Take 2 tablets (160 mg) by mouth twice daily - morning and lunch   Yes Historical Provider, MD  hydrALAZINE (APRESOLINE) 100 MG tablet TAKE 1 TABLET BY MOUTH 3  TIMES A DAY 08/14/15  Yes Sueanne Margarita, MD  magnesium hydroxide (MILK OF MAGNESIA) 400 MG/5ML suspension Take 15 mLs by mouth every 6 (six) hours as needed for mild constipation.   Yes Historical Provider, MD  Multiple Vitamin (MULTIVITAMIN WITH MINERALS) TABS tablet Take 1 tablet by mouth daily. "PhytoMulti" from [employer]   Yes Historical Provider, MD  OVER THE COUNTER MEDICATION Elderberry D3Fense from [employer] (50 mcg D3)   Yes Historical Provider, MD  OVER THE COUNTER MEDICATION Take 1 tablet by mouth daily. Daily Free Amino Acids from [employer] - with Magnesium and L-Carnitine   Yes Historical Provider, MD  OVER THE COUNTER MEDICATION Take 1 tablet by mouth daily. KB-C - Kidney and Bone support from [employer]    Yes Historical Provider, MD  OVER THE COUNTER MEDICATION Take 1 tablet by mouth daily. Raw Calcium (whole food plant formula with calcium, magnesium, vitamins D3 & K2)   Yes Historical Provider, MD  OVER THE COUNTER MEDICATION Take 1 tablet by mouth daily. Gall Bladder Formula from [employer]   Yes Historical Provider, MD  potassium chloride SA (K-DUR,KLOR-CON) 20 MEQ tablet Take 20 mEq by mouth See admin instructions. Take 1 tablet (20 meq) by mouth twice daily - morning and lunch 02/28/14  Yes Sueanne Margarita, MD  PRESCRIPTION MEDICATION Inhale into the lungs at bedtime. CPAP   Yes Historical Provider, MD  simvastatin (ZOCOR) 20 MG tablet Take 20 mg by mouth at bedtime.  01/09/11  Yes Historical Provider, MD  valsartan (DIOVAN) 160 MG tablet Take 160 mg by mouth See admin instructions. Take 1 tablet (160 mg) by mouth twice daily - morning and lunch 11/09/14  Yes Historical Provider, MD  zolpidem (AMBIEN) 10 MG tablet Take 1 tablet (10 mg total) by mouth at bedtime as needed for sleep. Patient taking differently: Take 10 mg by mouth at bedtime.  02/21/14  Yes Sueanne Margarita, MD    Family History Family History  Problem Relation Age of Onset  . Diabetes Mother   . Heart failure Mother   . Hypertension Mother   . Heart disease Father   . Hypertension Brother     Social History Social History  Substance Use Topics  . Smoking status: Former Smoker    Types: Cigarettes    Quit date: 05/16/2009  . Smokeless tobacco: Not on file  . Alcohol use 1.2 oz/week    2 Cans of beer per week     Allergies   Patient has no known allergies.   Review of Systems Review of Systems  Constitutional: Positive for appetite change. Negative for chills and fever.  Respiratory: Negative for shortness of breath.   Cardiovascular: Negative for chest pain.  Gastrointestinal: Positive for abdominal pain and constipation. Negative for blood in stool, diarrhea, nausea and vomiting.  Genitourinary: Negative  for difficulty urinating and dysuria.  All other systems reviewed and are negative.    Physical Exam Updated Vital Signs BP 154/66   Pulse 72   Temp 99.4 F (37.4 C) (Oral)   Resp 16   SpO2 97%   Physical Exam  Constitutional: He is oriented to person, place, and time. He appears well-developed and well-nourished. No distress.  Obese, pleasant, NAD  HENT:  Head: Normocephalic and atraumatic.  Eyes: Conjunctivae are normal. Pupils are equal, round, and reactive to light. Right eye exhibits no discharge. Left eye exhibits no discharge. No scleral icterus.  Neck: Normal range of motion.  Cardiovascular: Normal rate  and regular rhythm.  Exam reveals no gallop and no friction rub.   No murmur heard. Pulmonary/Chest: Effort normal and breath sounds normal. No respiratory distress. He has no wheezes. He has no rales. He exhibits no tenderness.  Abdominal: Soft. Bowel sounds are normal. He exhibits no distension and no mass. There is no tenderness. There is no rebound and no guarding. No hernia.  Neurological: He is alert and oriented to person, place, and time.  Skin: Skin is warm and dry.  Psychiatric: He has a normal mood and affect. His behavior is normal.  Nursing note and vitals reviewed.    ED Treatments / Results  Labs (all labs ordered are listed, but only abnormal results are displayed) Labs Reviewed  CBC WITH DIFFERENTIAL/PLATELET - Abnormal; Notable for the following:       Result Value   WBC 14.1 (*)    RBC 3.44 (*)    Hemoglobin 9.2 (*)    HCT 29.1 (*)    Platelets 143 (*)    Neutro Abs 11.0 (*)    Monocytes Absolute 1.3 (*)    All other components within normal limits  COMPREHENSIVE METABOLIC PANEL - Abnormal; Notable for the following:    Potassium 3.0 (*)    Chloride 92 (*)    Glucose, Bld 123 (*)    BUN 58 (*)    Creatinine, Ser 7.38 (*)    AST 14 (*)    ALT 12 (*)    GFR calc non Af Amer 8 (*)    GFR calc Af Amer 9 (*)    Anion gap 16 (*)    All  other components within normal limits  URINALYSIS, ROUTINE W REFLEX MICROSCOPIC - Abnormal; Notable for the following:    Protein, ur >=300 (*)    Bacteria, UA RARE (*)    Squamous Epithelial / LPF 0-5 (*)    All other components within normal limits    EKG  EKG Interpretation None       Radiology Dg Abd Acute W/chest  Result Date: 07/01/2016 CLINICAL DATA:  Patient c/o lower abdominal pain, nausea, and constipation x 3 days; no chest complaints; hx asthma, diabetic, former smoker, left chest tube 30 + years ago EXAM: DG ABDOMEN ACUTE W/ 1V CHEST COMPARISON:  07/28/2011 FINDINGS: Normal bowel gas pattern. There is mild to moderate increased stool in the colon most in the left upper quadrant. No free air. No evidence of renal or ureteral stones. Soft tissues are unremarkable. Heart, mediastinum and hila are unremarkable. Mild linear scarring or atelectasis is noted in the lung bases. Lungs are otherwise clear. No significant skeletal abnormality. IMPRESSION: 1. No acute findings in the abdomen pelvis. 2. Mild to moderate generalized increased stool in the colon. 3. No acute cardiopulmonary disease. Electronically Signed   By: Amie Portland M.D.   On: 07/01/2016 16:32    Procedures Procedures (including critical care time)  Medications Ordered in ED Medications  potassium chloride SA (K-DUR,KLOR-CON) CR tablet 40 mEq (40 mEq Oral Given 07/02/16 0012)    Initial Impression / Assessment and Plan / ED Course  I have reviewed the triage vital signs and the nursing notes.  Pertinent labs & imaging results that were available during my care of the patient were reviewed by me and considered in my medical decision making (see chart for details).  46 year old male with slightly worse SCr from baseline which appears to be 5-6. He is hypertensive but otherwise vitals are normal. CBC remarkable  for leukocytosis of 14.1 and anemia. Leukocytosis appears to be chronic. Anemia is at baseline. BMP  remarkable for hypokalemia - K replaced in ED. BUN/SCr is 58/7.38. Unclear if this is worse because of decreased PO intake vs progression of disease. Shared visit with Dr. Wyvonnia Dusky. Spoke with Nephrology who recommends close follow up with Dr. Mercy Moore. Return precautions given.  Final Clinical Impressions(s) / ED Diagnoses   Final diagnoses:  CKD (chronic kidney disease) stage 5, GFR less than 15 ml/min [facility])    New Prescriptions Discharge Medication List as of 07/02/2016  1:06 AM       Recardo Evangelist, PA-C 07/02/16 [id number]    Forde Dandy, MD 07/02/16 1151

## 2016-07-01 NOTE — ED Notes (Signed)
Pt ambulated to restroom wiht no issues.

## 2016-07-01 NOTE — ED Triage Notes (Addendum)
Pt reports he was was told by his PCP to come to the ED today due to creatinine of 7.7 and potassium was 3.2. He denies trouble urinating but does report right flank discomfort.

## 2016-07-02 LAB — URINALYSIS, ROUTINE W REFLEX MICROSCOPIC
Bilirubin Urine: NEGATIVE
Glucose, UA: NEGATIVE mg/dL
Hgb urine dipstick: NEGATIVE
Ketones, ur: NEGATIVE mg/dL
Leukocytes, UA: NEGATIVE
Nitrite: NEGATIVE
Protein, ur: >=300 mg/dL — AB
Specific Gravity, Urine: 1.011 (ref 1.005–1.030)
pH: 5 (ref 5.0–8.0)

## 2016-07-02 MED ORDER — POTASSIUM CHLORIDE CRYS ER 20 MEQ PO TBCR
40.0000 meq | EXTENDED_RELEASE_TABLET | Freq: Once | ORAL | Status: AC
  Administered 2016-07-02: 40 meq via ORAL
  Filled 2016-07-02: qty 2

## 2016-07-02 NOTE — Discharge Instructions (Signed)
Please drink fluids and follow up with Dr. Mercy Moore

## 2016-07-02 NOTE — ED Notes (Signed)
Pt has stated that he feels he can go home and drink fluids. He did not want the IV fluids. Stated he was going to tell the Dr he wanted to go home and not stay.

## 2016-08-02 ENCOUNTER — Other Ambulatory Visit (HOSPITAL_COMMUNITY): Payer: Self-pay | Admitting: *Deleted

## 2016-08-03 ENCOUNTER — Encounter (HOSPITAL_COMMUNITY): Payer: BLUE CROSS/BLUE SHIELD

## 2016-08-03 ENCOUNTER — Encounter (HOSPITAL_COMMUNITY)
Admission: RE | Admit: 2016-08-03 | Discharge: 2016-08-03 | Disposition: A | Discharge status: Home / Self Care | Payer: BLUE CROSS/BLUE SHIELD | Source: Ambulatory Visit | Attending: Nephrology | Admitting: Nephrology

## 2016-08-03 DIAGNOSIS — D631 Anemia in chronic kidney disease: Secondary | ICD-10-CM | POA: Insufficient documentation

## 2016-08-03 DIAGNOSIS — N189 Chronic kidney disease, unspecified: Secondary | ICD-10-CM | POA: Diagnosis present

## 2016-08-03 MED ORDER — SODIUM CHLORIDE 0.9 % IV SOLN
510.0000 mg | INTRAVENOUS | Status: DC
  Administered 2016-08-03: 510 mg via INTRAVENOUS
  Filled 2016-08-03: qty 17

## 2016-08-08 ENCOUNTER — Other Ambulatory Visit (HOSPITAL_COMMUNITY): Payer: Self-pay | Admitting: *Deleted

## 2016-08-09 ENCOUNTER — Encounter (HOSPITAL_COMMUNITY)
Admission: RE | Admit: 2016-08-09 | Discharge: 2016-08-09 | Disposition: A | Discharge status: Home / Self Care | Payer: BLUE CROSS/BLUE SHIELD | Source: Ambulatory Visit | Attending: Nephrology | Admitting: Nephrology

## 2016-08-09 DIAGNOSIS — N189 Chronic kidney disease, unspecified: Secondary | ICD-10-CM | POA: Diagnosis not present

## 2016-08-09 MED ORDER — SODIUM CHLORIDE 0.9 % IV SOLN
510.0000 mg | INTRAVENOUS | Status: DC
  Filled 2016-08-09: qty 17

## 2016-08-09 MED ORDER — SODIUM CHLORIDE 0.9 % IV SOLN
510.0000 mg | INTRAVENOUS | Status: DC
  Administered 2016-08-09: 510 mg via INTRAVENOUS
  Filled 2016-08-09: qty 17

## 2016-08-19 ENCOUNTER — Encounter: Payer: Self-pay | Admitting: Cardiology

## 2016-08-31 ENCOUNTER — Other Ambulatory Visit: Payer: Self-pay | Admitting: Cardiology

## 2016-09-05 ENCOUNTER — Encounter: Payer: Self-pay | Admitting: Cardiology

## 2016-09-06 ENCOUNTER — Ambulatory Visit (INDEPENDENT_AMBULATORY_CARE_PROVIDER_SITE_OTHER): Payer: BLUE CROSS/BLUE SHIELD | Admitting: Cardiology

## 2016-09-06 ENCOUNTER — Encounter: Payer: Self-pay | Admitting: Cardiology

## 2016-09-06 VITALS — BP 142/88 | HR 71 | Ht 67.0 in | Wt 339.0 lb

## 2016-09-06 DIAGNOSIS — I5032 Chronic diastolic (congestive) heart failure: Secondary | ICD-10-CM

## 2016-09-06 DIAGNOSIS — I420 Dilated cardiomyopathy: Secondary | ICD-10-CM | POA: Diagnosis not present

## 2016-09-06 DIAGNOSIS — I10 Essential (primary) hypertension: Secondary | ICD-10-CM

## 2016-09-06 DIAGNOSIS — G4733 Obstructive sleep apnea (adult) (pediatric): Secondary | ICD-10-CM

## 2016-09-06 LAB — BASIC METABOLIC PANEL
BUN/Creatinine Ratio: 11 (ref 9–20)
BUN: 90 mg/dL (ref 6–24)
CO2: 23 mmol/L (ref 18–29)
Calcium: 8.7 mg/dL (ref 8.7–10.2)
Chloride: 94 mmol/L — ABNORMAL LOW (ref 96–106)
Creatinine, Ser: 7.84 mg/dL — ABNORMAL HIGH (ref 0.76–1.27)
GFR calc Af Amer: 9 mL/min/{1.73_m2} — ABNORMAL LOW (ref >59)
GFR calc non Af Amer: 8 mL/min/{1.73_m2} — ABNORMAL LOW (ref >59)
Glucose: 135 mg/dL — ABNORMAL HIGH (ref 65–99)
Potassium: 3.5 mmol/L (ref 3.5–5.2)
Sodium: 141 mmol/L (ref 134–144)

## 2016-09-06 NOTE — Progress Notes (Signed)
Cardiology Office Note    Date:  09/06/2016   ID:  Adrian Frye, DOB 1971/01/09, MRN [id number]  PCP:  [name], MD  Cardiologist:  [name], MD   Chief Complaint  Patient presents with  . Sleep Apnea  . Hypertension  . Congestive Heart Failure  . Cardiomyopathy    History of Present Illness:  Adrian Frye is a 46 y.o. male a history of DCM, HTN, OSA and obesity. He is here for followup and has been doing well. He denies any chest pain, SOB, DOE, dizziness, palpitations or syncope.  He has not had any PND, orthopnea and minimal LE edema.  He tolerates his CPAP well. He uses a nasal mask with no chin strap which he tolerates well. He does occasionally have a dry throat but no dry nose or congestion. He feels rested when he gets up in the am and has no daytime sleepiness.     Past Medical History:  Diagnosis Date  . Allergic rhinitis   . Asthma   . Chronic kidney disease    stage III-IV  . Diabetes mellitus without complication ([place])   . Dilated idiopathic cardiomyopathy ([place])    now resoved with EF 55% by echo 2011  . Edema   . Hyperparathyroidism, secondary ([place])   . Hypertension   . Morbid obesity ([place])   . Sleep apnea   . UGI bleed 2011   ASA    Past Surgical History:  Procedure Laterality Date  . ACHILLES TENDON REPAIR     ruptured; right  . PLEURAL SCARIFICATION     left, football trauma-chest tube  . TONSILLECTOMY      Current Medications: Current Meds  Medication Sig  . allopurinol (ZYLOPRIM) 300 MG tablet Take 300 mg by mouth daily after lunch.   . calcitRIOL (ROCALTROL) 0.25 MCG capsule Take 0.5 mcg by mouth daily after lunch.   . carvedilol (COREG) 25 MG tablet Take 25 mg by mouth 2 (two) times daily.    Marland Kitchen co-enzyme Q-10 30 MG capsule Take 30 mg by mouth daily.  . colchicine 0.6 MG tablet Take 0.6 mg by mouth daily as needed (for gout).   . Digestive Enzyme CAPS Take 2 capsules by mouth daily. From [employer]  .  ferrous sulfate 325 (65 FE) MG tablet Take 650 mg by mouth daily with breakfast.  . furosemide (LASIX) 80 MG tablet Take 160 mg by mouth See admin instructions. Take 2 tablets (160 mg) by mouth twice daily - morning and lunch  . hydrALAZINE (APRESOLINE) 100 MG tablet TAKE 1 TABLET BY MOUTH 3 TIMES A DAY  . magnesium hydroxide (MILK OF MAGNESIA) 400 MG/5ML suspension Take 15 mLs by mouth every 6 (six) hours as needed for mild constipation.  . Multiple Vitamin (MULTIVITAMIN WITH MINERALS) TABS tablet Take 1 tablet by mouth daily. "PhytoMulti" from [place]  . OVER THE COUNTER MEDICATION Elderberry D3Fense from [employer] (50 mcg D3)  . OVER THE COUNTER MEDICATION Take 1 tablet by mouth daily. Daily Free Amino Acids from [employer] - with Magnesium and L-Carnitine  . OVER THE COUNTER MEDICATION Take 1 tablet by mouth daily. KB-C - Kidney and Bone support from [place]  . OVER THE COUNTER MEDICATION Take 1 tablet by mouth daily. Raw Calcium (whole food plant formula with calcium, magnesium, vitamins D3 & K2)  . OVER THE COUNTER MEDICATION Take 1 tablet by mouth daily. Gall Bladder Formula from [employer]  . potassium chloride  SA (K-DUR,KLOR-CON) 20 MEQ tablet Take 20 mEq by mouth See admin instructions. Take 1 tablet (20 meq) by mouth twice daily - morning and lunch  . PRESCRIPTION MEDICATION Inhale into the lungs at bedtime. CPAP  . simvastatin (ZOCOR) 20 MG tablet Take 20 mg by mouth at bedtime.   . valsartan (DIOVAN) 160 MG tablet Take 160 mg by mouth See admin instructions. Take 1 tablet (160 mg) by mouth twice daily - morning and lunch  . zolpidem (AMBIEN) 10 MG tablet Take 1 tablet (10 mg total) by mouth at bedtime as needed for sleep. (Patient taking differently: Take 10 mg by mouth at bedtime. )    Allergies:   Patient has no known allergies.   Social History   Social History  . Marital status: Married    Spouse name: N/A  . Number of children: N/A  .  Years of education: N/A   Occupational History  . real Investment banker, corporate    Social History Main Topics  . Smoking status: Former Smoker    Types: Cigarettes    Quit date: 05/16/2009  . Smokeless tobacco: Never Used  . Alcohol use 1.2 oz/week    2 Cans of beer per week  . Drug use: No  . Sexual activity: Not Asked   Other Topics Concern  . None   Social History Narrative  . None     Family History:  The patient's family history includes Diabetes in his mother; Heart disease in his father; Heart failure in his mother; Hypertension in his brother and mother.   ROS:   Please see the history of present illness.    ROS All other systems reviewed and are negative.  No flowsheet data found.     PHYSICAL EXAM:   VS:  BP (!) 142/88   Pulse 71   Ht 5\' 7"  (1.702 m)   Wt (!) 339 lb (153.8 kg)   SpO2 96%   BMI 53.09 kg/m    GEN: Well nourished, well developed, in no acute distress  HEENT: normal  Neck: no JVD, carotid bruits, or masses Cardiac: RRR; no murmurs, rubs, or gallops,no edema.  Intact distal pulses bilaterally.  Respiratory:  clear to auscultation bilaterally, normal work of breathing GI: soft, nontender, nondistended, + BS MS: no deformity or atrophy  Skin: warm and dry, no rash Neuro:  Alert and Oriented x 3, Strength and sensation are intact Psych: euthymic mood, full affect  Wt Readings from Last 3 Encounters:  09/06/16 (!) 339 lb (153.8 kg)  08/09/16 (!) 338 lb (153.3 kg)  08/03/16 (!) 334 lb (151.5 kg)      Studies/Labs Reviewed:   EKG:  EKG is not ordered today.  Recent Labs: 07/01/2016: ALT 12; BUN 58; Creatinine, Ser 7.38; Hemoglobin 9.2; Platelets 143; Potassium 3.0; Sodium 136   Lipid Panel No results found for: CHOL, TRIG, HDL, CHOLHDL, VLDL, LDLCALC, LDLDIRECT  Additional studies/ records that were reviewed today include:  CPAP    ASSESSMENT:    1. Obstructive sleep apnea   2. Essential hypertension   3. Chronic diastolic heart  failure ([place])   4. Dilated idiopathic cardiomyopathy ([place])   5. Morbid obesity ([place])      PLAN:  In order of problems listed above:  OSA - the patient is tolerating PAP therapy well without any problems. The PAP download was reviewed today and showed an AHI of 0.7/hr on 20 cm H2O with 100% compliance in using more than 4 hours nightly.  The  patient has been using and benefiting from CPAP use and will continue to benefit from therapy. He would like to get a new device as his is > 42 years old. HTN - BP is adequately controlled today.  He will continue on hydralazine, BB and ARB. Chronic diastolic CHF - he appears euvolemic on exam.  His weight has been stable.  He will continue on Lasix.   DCM - resolved with EF 60% on echo 2015.  He complaining that when he swims he reaches a point where his energy just drains out and he has to stop but he wants to go further.  I will get a stress myoview to rule out ischemia and reassess LVF.  5.   Morbid obesity - I congratulated him on losing 18 lbs and I have encouraged him to continue with his swimming.  He is interested in looking into weight loss surgery so I will refer him.      Medication Adjustments/Labs and Tests Ordered: Current medicines are reviewed at length with the patient today.  Concerns regarding medicines are outlined above.  Medication changes, Labs and Tests ordered today are listed in the Patient Instructions below.  There are no Patient Instructions on file for this visit.   Signed, [name], MD  09/06/2016 8:22 AM    [facility] [place], [place], [place]  [address] Phone: [phone]; Fax: [phone]

## 2016-09-06 NOTE — Patient Instructions (Signed)
Medication Instructions:  Your physician recommends that you continue on your current medications as directed. Please refer to the Current Medication list given to you today.   Labwork: TODAY: BMET  Testing/Procedures: None  Follow-Up: Your physician wants you to follow-up in: 6 months with Dr. Radford Pax. You will receive a reminder letter in the mail two months in advance. If you don't receive a letter, please call our office to schedule the follow-up appointment.   Any Other Special Instructions Will Be Listed Below (If Applicable). Dr. Radford Pax has ordered for you a new CPAP. Please contact our office in the next week or so if you do not hear from [facility] to arrange this.  Bariatric Surgery You have so much to gain by losing weight.  You may have already tried every diet and exercise plan imaginable.  And, you may have sought advice from your family physician, too.   Sometimes, in spite of such diligent efforts, you may not be able to achieve long-term results by yourself.  In cases of severe obesity, bariatric or weight loss surgery is a proven method of achieving long-term weight control.  Our Services Our bariatric surgery programs offer our patients new hope and long-term weight-loss solution.  Since introducing our services in 2003, we have conducted more than 2,400 successful procedures.  Our program is designated as a Programmer, multimedia by the Metabolic and Bariatric Surgery Accreditation and Quality Improvement Program (MBSAQIP), a IT trainer that sets rigorous patient safety and outcome standards.  Our program is also designated as a Ecologist by [employer].   Our exceptional weight-loss surgery team specializes in diagnosis, treatment, follow-up care, and ongoing support for our patients with severe weight loss challenges.  We currently offer laparoscopic sleeve gastrectomy, gastric bypass, and adjustable gastric band (LAP-BAND).     Attend our [place] Choosing to undergo a bariatric procedure is a big decision, and one that should not be taken lightly.  You now have two options in how you learn about weight-loss surgery - in person or online.  Our objective is to ensure you have all of the information that you need to evaluate the advantages and obligations of this life changing procedure.  Please note that you are not alone in this process, and our experienced team is ready to assist and answer all of your questions.  There are several ways to register for a seminar (either on-line or in person): 1)  Call [phone] 2) Go on-line to [facility] and register for either type of seminar.  [email or web address]     If you need a refill on your cardiac medications before your next appointment, please call your pharmacy.

## 2016-09-08 ENCOUNTER — Telehealth: Payer: Self-pay | Admitting: *Deleted

## 2016-09-08 NOTE — Telephone Encounter (Signed)
-----   Message from Sueanne Margarita, MD sent at 09/07/2016 11:50 AM EDT ----- Good AHI and compliance.  Continue current CPAP settings.

## 2016-09-08 NOTE — Telephone Encounter (Signed)
Called patient with results.LMTCB

## 2016-09-09 NOTE — Telephone Encounter (Addendum)
Informed patient of results and he verbalized understanding. Patient understands Dr. Radford Pax has ordered a New Resmed CPAP device with heated humidity at 20 cm H2O in epic. Patient understands [facility] will contact him with a new set up. Patient understands to call if [facility] does not contact him in a timely manner.

## 2016-09-12 NOTE — Telephone Encounter (Signed)
-----   Message from Sueanne Margarita, MD sent at 09/07/2016 11:50 AM EDT ----- Good AHI and compliance.  Continue current CPAP settings.

## 2016-09-12 NOTE — Telephone Encounter (Signed)
Patient called in stating [facility] contacted him with an appointment (2-hour) consultation to receive his machine. He doesn't feel he needs the consultation because he has had his machine for several years. I called [facility] and talked to [facility] and she states the patient can call and request an individual appointment which is an (11/2) but he still has have to have the consultation because it is a new machine that  has to be calibrated, he'll have a mask fitting for his new mask, get supplies ask any questions and enroll in airvview. Called the patient back and informed him as to why the consultation was necessary he thanked me and said he would call [facility] and set up his appointment.

## 2016-09-12 NOTE — Telephone Encounter (Signed)
Informed patient of  results and he verbalized understanding. Patient understands his settings will stay the same. Patient agrees with treatment plan and thanked me.

## 2016-09-15 NOTE — Telephone Encounter (Signed)
Patient is not yet set up

## 2016-09-15 NOTE — Telephone Encounter (Signed)
-----   Message from Theodoro Parma, RN sent at 09/06/2016 10:06 AM EDT ----- Regarding: DME order  DME order is placed.  Melissa- will this patient need a 10 week appointment after he gets his machine?  Gae Bon- he was instructed to call you in a week if he does not hear from [facility].  Thanks!

## 2016-09-28 ENCOUNTER — Other Ambulatory Visit: Payer: Self-pay | Admitting: Cardiology

## 2016-09-28 MED ORDER — HYDRALAZINE HCL 100 MG PO TABS: 100.0000 mg | ORAL_TABLET | Freq: Three times a day (TID) | ORAL | 10 refills | Status: DC

## 2016-10-07 ENCOUNTER — Other Ambulatory Visit (HOSPITAL_COMMUNITY): Payer: Self-pay | Admitting: *Deleted

## 2016-10-11 ENCOUNTER — Encounter (HOSPITAL_COMMUNITY)
Admission: RE | Admit: 2016-10-11 | Discharge: 2016-10-11 | Disposition: A | Discharge status: Home / Self Care | Payer: BLUE CROSS/BLUE SHIELD | Source: Ambulatory Visit | Attending: Nephrology | Admitting: Nephrology

## 2016-10-11 DIAGNOSIS — N189 Chronic kidney disease, unspecified: Secondary | ICD-10-CM | POA: Insufficient documentation

## 2016-10-11 DIAGNOSIS — D631 Anemia in chronic kidney disease: Secondary | ICD-10-CM | POA: Insufficient documentation

## 2016-10-11 LAB — POCT HEMOGLOBIN-HEMACUE: Hemoglobin: 8.8 g/dL — ABNORMAL LOW (ref 13.0–17.0)

## 2016-10-11 MED ORDER — DARBEPOETIN ALFA 100 MCG/0.5ML IJ SOSY
PREFILLED_SYRINGE | INTRAMUSCULAR | Status: AC
  Filled 2016-10-11: qty 0.5

## 2016-10-11 MED ORDER — DARBEPOETIN ALFA 100 MCG/0.5ML IJ SOSY
100.0000 ug | PREFILLED_SYRINGE | Freq: Once | INTRAMUSCULAR | Status: AC
  Administered 2016-10-11: 100 ug via SUBCUTANEOUS

## 2016-10-11 NOTE — Discharge Instructions (Signed)

## 2016-10-12 ENCOUNTER — Encounter: Payer: Self-pay | Admitting: Cardiology

## 2016-10-14 ENCOUNTER — Telehealth: Payer: Self-pay | Admitting: *Deleted

## 2016-10-14 NOTE — Telephone Encounter (Signed)
Informed patient of compliance results and he verbalized understanding. Patient understands his current settings will not change.

## 2016-10-14 NOTE — Telephone Encounter (Signed)
-----   Message from Sueanne Margarita, MD sent at 10/14/2016 11:07 AM EDT ----- Good AHI and compliance.  Continue current CPAP settings.

## 2016-10-26 ENCOUNTER — Encounter (HOSPITAL_COMMUNITY): Payer: BLUE CROSS/BLUE SHIELD

## 2016-11-17 NOTE — Telephone Encounter (Addendum)
Patient's compliance range is 10/16/16-12/15/16. Patient has a 10 week f/u appointment for 11/30/16 at 11:40.

## 2016-11-29 ENCOUNTER — Ambulatory Visit: Payer: BLUE CROSS/BLUE SHIELD | Admitting: Cardiology

## 2016-11-29 ENCOUNTER — Encounter: Payer: Self-pay | Admitting: *Deleted

## 2016-11-30 ENCOUNTER — Ambulatory Visit: Payer: BLUE CROSS/BLUE SHIELD | Admitting: Cardiology

## 2016-11-30 NOTE — Progress Notes (Deleted)
Cardiology Office Note    Date:  11/30/2016   ID:  Adrian Frye, DOB 14-Nov-1970, MRN [id number]  PCP:  Seward Carol, MD  Cardiologist:  [name], MD   No chief complaint on file.   History of Present Illness:  Adrian Frye is a 46 y.o. male with  a history of DCM (EF 55% by echo 2011), HTN, OSA and obesity. He denies any chest pain or pressure, SOB, DOE, , PND, orthopnea, dizziness, palpitations or syncope.  He has chronic minimal LE edema. He is back today per insurance requirements after getting a new CPAP device.  He is doing well and loving his new CPAP.  He uses a nasal mask with no chin strap which he tolerates well. He does occasionally have a dry throat but no dry nose or congestion. He feels rested when he gets up in the am and has no daytime sleepiness.    Past Medical History:  Diagnosis Date  . Allergic rhinitis   . Asthma   . Chronic kidney disease    stage III-IV  . Diabetes mellitus without complication ([place])   . Dilated idiopathic cardiomyopathy ([place])    now resoved with EF 55% by echo 2011  . Edema   . Hyperparathyroidism, secondary ([place])   . Hypertension   . Morbid obesity ([place])   . Sleep apnea   . UGI bleed 2011   ASA    Past Surgical History:  Procedure Laterality Date  . ACHILLES TENDON REPAIR     ruptured; right  . PLEURAL SCARIFICATION     left, football trauma-chest tube  . TONSILLECTOMY      Current Medications: No outpatient prescriptions have been marked as taking for the 11/30/16 encounter (Appointment) with Sueanne Margarita, MD.    Allergies:   Patient has no known allergies.   Social History   Social History  . Marital status: Married    Spouse name: N/A  . Number of children: N/A  . Years of education: N/A   Occupational History  . real Investment banker, corporate    Social History Main Topics  . Smoking status: Former Smoker    Types: Cigarettes    Quit date: 05/16/2009  . Smokeless tobacco: Never Used  . Alcohol  use 1.2 oz/week    2 Cans of beer per week  . Drug use: No  . Sexual activity: Not on file   Other Topics Concern  . Not on file   Social History Narrative  . No narrative on file     Family History:  The patient's family history includes Diabetes in his mother; Heart disease in his father; Heart failure in his mother; Hypertension in his brother and mother.   ROS:   Please see the history of present illness.    ROS All other systems reviewed and are negative.  No flowsheet data found.     PHYSICAL EXAM:   VS:  There were no vitals taken for this visit.   GEN: Well nourished, well developed, in no acute distress  HEENT: normal  Neck: no JVD, carotid bruits, or masses Cardiac: RRR; no murmurs, rubs, or gallops,no edema.  Intact distal pulses bilaterally.  Respiratory:  clear to auscultation bilaterally, normal work of breathing GI: soft, nontender, nondistended, + BS MS: no deformity or atrophy  Skin: warm and dry, no rash Neuro:  Alert and Oriented x 3, Strength and sensation are intact Psych: euthymic mood, full affect  Wt Readings from Last 3  Encounters:  09/06/16 (!) 339 lb (153.8 kg)  08/09/16 (!) 338 lb (153.3 kg)  08/03/16 (!) 334 lb (151.5 kg)      Studies/Labs Reviewed:   EKG:  EKG is not ordered today.    Recent Labs: 07/01/2016: ALT 12; Platelets 143 09/06/2016: BUN 90; Creatinine, Ser 7.84; Potassium 3.5; Sodium 141 10/11/2016: Hemoglobin 8.8   Lipid Panel No results found for: CHOL, TRIG, HDL, CHOLHDL, VLDL, LDLCALC, LDLDIRECT  Additional studies/ records that were reviewed today include:  CPAP download    ASSESSMENT:    1. Obstructive sleep apnea   2. Essential hypertension   3. Chronic diastolic heart failure ([place])   4. Morbid obesity ([place])      PLAN:  In order of problems listed above:  OSA - the patient is tolerating PAP therapy well without any problems. The PAP download was reviewed today and showed an AHI of 2/hr on 20 cm H2O  with 100% compliance in using more than 4 hours nightly.  The patient has been using and benefiting from CPAP use and will continue to benefit from therapy.   2.   HTN - BP controlled on exam today. He will continue Hydralazine 100mg  TID, carvedilol 25mg  BID.  He has been on Valsartan but there has been a recall on the drug so I will change him to Losartan 50mg  daily.  3.   Chronic diastolic CHF - he appears euvolemic on exam today and weight is stable. He will continue on Lasix.  I will check a BMET today as his last few K+ levels have been somewhat low.   4.   Morbid obesity - I have encouraged him to get into a routine exercise program and cut back on carbs and portions.      Medication Adjustments/Labs and Tests Ordered: Current medicines are reviewed at length with the patient today.  Concerns regarding medicines are outlined above.  Medication changes, Labs and Tests ordered today are listed in the Patient Instructions below.  There are no Patient Instructions on file for this visit.   Signed, [name], MD  11/30/2016 9:18 AM    [facility] [place], [place], [place]  [address] Phone: [phone]; Fax: [phone]

## 2016-12-02 ENCOUNTER — Other Ambulatory Visit: Payer: Self-pay | Admitting: Otolaryngology

## 2016-12-02 DIAGNOSIS — H9123 Sudden idiopathic hearing loss, bilateral: Secondary | ICD-10-CM

## 2016-12-14 ENCOUNTER — Encounter: Payer: Self-pay | Admitting: Cardiology

## 2016-12-15 ENCOUNTER — Other Ambulatory Visit: Payer: Self-pay | Admitting: Otolaryngology

## 2016-12-15 ENCOUNTER — Other Ambulatory Visit: Payer: BLUE CROSS/BLUE SHIELD

## 2016-12-15 ENCOUNTER — Ambulatory Visit
Admission: RE | Admit: 2016-12-15 | Discharge: 2016-12-15 | Disposition: A | Discharge status: Home / Self Care | Payer: BLUE CROSS/BLUE SHIELD | Source: Ambulatory Visit | Attending: Otolaryngology | Admitting: Otolaryngology

## 2016-12-15 DIAGNOSIS — H9123 Sudden idiopathic hearing loss, bilateral: Secondary | ICD-10-CM

## 2016-12-20 ENCOUNTER — Telehealth: Payer: Self-pay | Admitting: *Deleted

## 2016-12-20 NOTE — Telephone Encounter (Signed)
-----   Message from Sueanne Margarita, MD sent at 12/19/2016  5:50 PM EDT ----- Good AHI and compliance.  Continue current CPAP settings.

## 2016-12-20 NOTE — Telephone Encounter (Signed)
Informed patient of compliance results and patient understanding was verbalized. Patient understands his settings will not change. Patient was grateful for the call and thanked me.

## 2017-01-17 NOTE — Progress Notes (Signed)
Cardiology Office Note:    Date:  01/18/2017   ID:  Adrian Frye, DOB [birth year], MRN [id number]  PCP:  Seward Carol, MD  Cardiologist:  [name], MD   Referring MD: Seward Carol, MD   Chief Complaint  Patient presents with  . Sleep Apnea  . Hypertension    History of Present Illness:    Adrian Frye is a 46 y.o. male with a hx of DCM, HTN, OSA on CPAP and obesity. He comes in today for routine followup after getting a new CPAP device per insurance requirements to document compliance.  He is doing well. He loves his new CPAP device and says that it works well. He uses a nasal mask with no chin strap which he tolerates well but wants to consider trying a full face mask. He says that he has had problems with staying asleep.  He goes to bed at 10-11pm and wakes up at 4am and then takes him 3 hours to get back to sleep.  He falls asleep in 20 min after going to bed.  He feels rested when he get up at 4am and has no daytime sleepiness.  He denies any ETOH use.  He occasionally has a dry throat but no dry nose or congestion. He denies any chest pain or pressure, SOB, DOE, PND, orthopnea, dizziness, palpitations or syncope. He continues to lose weight and has lost almost 50lbs in the past year.   Past Medical History:  Diagnosis Date  . Allergic rhinitis   . Asthma   . Chronic kidney disease    stage III-IV  . Diabetes mellitus without complication ([place])   . Dilated idiopathic cardiomyopathy ([place])    now resoved with EF 55% by echo 2011  . Edema   . Hyperparathyroidism, secondary ([place])   . Hypertension   . Morbid obesity ([place])   . Sleep apnea   . UGI bleed 2011   ASA    Past Surgical History:  Procedure Laterality Date  . ACHILLES TENDON REPAIR     ruptured; right  . PLEURAL SCARIFICATION     left, football trauma-chest tube  . TONSILLECTOMY      Current Medications: Current Meds  Medication Sig  . allopurinol (ZYLOPRIM) 300 MG tablet Take 300 mg by mouth  daily after lunch.   . calcitRIOL (ROCALTROL) 0.25 MCG capsule Take 0.5 mcg by mouth daily after lunch.   . carvedilol (COREG) 25 MG tablet Take 25 mg by mouth 2 (two) times daily.    . colchicine 0.6 MG tablet Take 0.6 mg by mouth daily as needed (for gout).   . furosemide (LASIX) 80 MG tablet Take 160 mg by mouth See admin instructions. Take 2 tablets (160 mg) by mouth twice daily - morning and lunch  . hydrALAZINE (APRESOLINE) 100 MG tablet Take 1 tablet (100 mg total) by mouth 3 (three) times daily. (Patient taking differently: Take 100 mg by mouth 2 (two) times daily. )  . Multiple Vitamin (MULTIVITAMIN WITH MINERALS) TABS tablet Take 1 tablet by mouth daily. "PhytoMulti" from [place]  . NIFEdipine (PROCARDIA-XL/ADALAT-CC/NIFEDICAL-XL) 30 MG 24 hr tablet Take 30 mg by mouth daily.  Marland Kitchen OVER THE COUNTER MEDICATION Elderberry D3Fense from [employer] (50 mcg D3)  . OVER THE COUNTER MEDICATION Take 1 tablet by mouth daily. Daily Free Amino Acids from [employer] - with Magnesium and L-Carnitine  . OVER THE COUNTER MEDICATION Take 1 tablet by mouth daily. KB-C - Kidney and Bone support from [employer]  . OVER THE COUNTER MEDICATION Take 1 tablet by mouth daily. Raw Calcium (whole food plant formula with calcium, magnesium, vitamins D3 & K2)  . potassium chloride SA (K-DUR,KLOR-CON) 20 MEQ tablet Take 20 mEq by mouth See admin instructions. Take 1 tablet (20 meq) by mouth twice daily - morning and lunch  . PRESCRIPTION MEDICATION Inhale into the lungs at bedtime. CPAP  . simvastatin (ZOCOR) 20 MG tablet Take 20 mg by mouth at bedtime.   Marland Kitchen zolpidem (AMBIEN) 10 MG tablet Take 1 tablet (10 mg total) by mouth at bedtime as needed for sleep. (Patient taking differently: Take 10 mg by mouth at bedtime. )     Allergies:   Patient has no known allergies.   Social History   Social History  . Marital status: Married    Spouse name: N/A  . Number of children: N/A  . Years of  education: N/A   Occupational History  . real Investment banker, corporate    Social History Main Topics  . Smoking status: Former Smoker    Types: Cigarettes    Quit date: 05/16/2009  . Smokeless tobacco: Never Used  . Alcohol use 1.2 oz/week    2 Cans of beer per week  . Drug use: No  . Sexual activity: Not Asked   Other Topics Concern  . None   Social History Narrative  . None     Family History: The patient's family history includes Diabetes in his mother; Heart disease in his father; Heart failure in his mother; Hypertension in his brother and mother.  ROS:   Please see the history of present illness.     All other systems reviewed and are negative.  EKGs/Labs/Other Studies Reviewed:    The following studies were reviewed today: none  EKG:  EKG is not ordered today.    Recent Labs: 07/01/2016: ALT 12; Platelets 143 09/06/2016: BUN 90; Creatinine, Ser 7.84; Potassium 3.5; Sodium 141 10/11/2016: Hemoglobin 8.8   Recent Lipid Panel No results found for: CHOL, TRIG, HDL, CHOLHDL, VLDL, LDLCALC, LDLDIRECT  Physical Exam:    VS:  BP (!) 142/84   Pulse 72   Ht 5\' 7"  (1.702 m)   Wt (!) 316 lb 1.9 oz (143.4 kg)   SpO2 96%   BMI 49.51 kg/m     Wt Readings from Last 3 Encounters:  01/18/17 (!) 316 lb 1.9 oz (143.4 kg)  09/06/16 (!) 339 lb (153.8 kg)  08/09/16 (!) 338 lb (153.3 kg)     GEN:  Well nourished, well developed in no acute distress HEENT: Normal NECK: No JVD; No carotid bruits LYMPHATICS: No lymphadenopathy CARDIAC: RRR, no murmurs, rubs, gallops RESPIRATORY:  Clear to auscultation without rales, wheezing or rhonchi  ABDOMEN: Soft, non-tender, non-distended MUSCULOSKELETAL:  No edema; No deformity  SKIN: Warm and dry NEUROLOGIC:  Alert and oriented x 3 PSYCHIATRIC:  Normal affect   ASSESSMENT:    1. Obstructive sleep apnea   2. Dilated idiopathic cardiomyopathy ([place])   3. Chronic diastolic heart failure ([place])   4. Essential hypertension    PLAN:     In order of problems listed above:  1.  OSA - the patient is tolerating PAP therapy well without any problems. The PAP download was reviewed today and showed an AHI of 3/hr on 20 cm H2O with 100% compliance in using more than 4 hours nightly.  The patient has been using and benefiting from CPAP use and will continue to benefit from therapy. He recently  got a new CPAP device and is doing well with it.  I will order him a ResMed Airfit F20 mask    2.  DCM - last EF assessment showed EF 65-70% with G2DD on echo 2015.  3.  Chronic diastolic CHF - He appears euvolemic on exam with stable weight and has actually dropped more.  He as no significant SOB or edema of the LE.  He will continue on Lasix 160mg  daily.  4.  HTN - BP is well controlled on exam today.  He will continue on Hydralazine 100mg  TID, Valsartan 160mg  daily and Carvedilol 25mg  BID.  5.  CKD stage 4 - followed by renal.     Medication Adjustments/Labs and Tests Ordered: Current medicines are reviewed at length with the patient today.  Concerns regarding medicines are outlined above.  No orders of the defined types were placed in this encounter.  No orders of the defined types were placed in this encounter.   Signed, [name], MD  01/18/2017 9:10 AM    [place]

## 2017-01-18 ENCOUNTER — Encounter: Payer: Self-pay | Admitting: Cardiology

## 2017-01-18 ENCOUNTER — Ambulatory Visit (INDEPENDENT_AMBULATORY_CARE_PROVIDER_SITE_OTHER): Payer: BLUE CROSS/BLUE SHIELD | Admitting: Cardiology

## 2017-01-18 VITALS — BP 142/84 | HR 72 | Ht 67.0 in | Wt 316.1 lb

## 2017-01-18 DIAGNOSIS — I420 Dilated cardiomyopathy: Secondary | ICD-10-CM | POA: Diagnosis not present

## 2017-01-18 DIAGNOSIS — I10 Essential (primary) hypertension: Secondary | ICD-10-CM | POA: Diagnosis not present

## 2017-01-18 DIAGNOSIS — I5032 Chronic diastolic (congestive) heart failure: Secondary | ICD-10-CM | POA: Diagnosis not present

## 2017-01-18 DIAGNOSIS — G4733 Obstructive sleep apnea (adult) (pediatric): Secondary | ICD-10-CM

## 2017-01-18 NOTE — Patient Instructions (Signed)
Medication Instructions:  Your provider recommends that you continue on your current medications as directed. Please refer to the Current Medication list given to you today.    Labwork: None  Testing/Procedures: None  Follow-Up: Your provider wants you to follow-up in: 1 year with Dr. Radford Pax. You will receive a reminder letter in the mail two months in advance. If you don't receive a letter, please call our office to schedule the follow-up appointment.    Any Other Special Instructions Will Be Listed Below (If Applicable). Dr. Radford Pax has ordered a new mask for your PAP.    If you need a refill on your cardiac medications before your next appointment, please call your pharmacy.

## 2017-01-19 ENCOUNTER — Telehealth: Payer: Self-pay | Admitting: Internal Medicine

## 2017-01-19 ENCOUNTER — Telehealth: Payer: Self-pay | Admitting: *Deleted

## 2017-01-19 DIAGNOSIS — G4733 Obstructive sleep apnea (adult) (pediatric): Secondary | ICD-10-CM

## 2017-01-19 NOTE — Telephone Encounter (Signed)
Gae Bon returned call, [phone].

## 2017-01-19 NOTE — Telephone Encounter (Signed)
Patient has switched to [facility]. Office notes and CPAP supply prescription faxed to [facility].

## 2017-01-19 NOTE — Telephone Encounter (Signed)
Gae Bon is aware that OSA is within epic. Gae Bon voiced understanding and had no further questions. Nothing further needed.

## 2017-01-19 NOTE — Telephone Encounter (Signed)
-----   Message from Theodoro Parma, RN sent at 01/18/2017  9:49 AM EDT ----- Regarding: DME order Order for new mask placed.  This patient would like to switch from [facility] to [facility] (if cost is similar to [facility]).  Please contact [facility] and get quotes for pricing. Then call the patient with information and see if he is willing to switch.   He needs all new supplies but those will need to be ordered to the appropriate DME once that is established after quoting.  Please call today as patient needs new supplies.  Thanks!

## 2017-01-19 NOTE — Telephone Encounter (Signed)
lmtcb X1 for Adrian Frye at [facility]. We havent seen pt since 2014, sleep study was done in 2001, and Dr. Radford Pax is managing pt's osa per chart.

## 2017-03-06 DIAGNOSIS — E1122 Type 2 diabetes mellitus with diabetic chronic kidney disease: Secondary | ICD-10-CM | POA: Insufficient documentation

## 2017-03-06 DIAGNOSIS — N185 Chronic kidney disease, stage 5: Secondary | ICD-10-CM | POA: Insufficient documentation

## 2017-03-06 DIAGNOSIS — I120 Hypertensive chronic kidney disease with stage 5 chronic kidney disease or end stage renal disease: Secondary | ICD-10-CM | POA: Insufficient documentation

## 2017-09-09 ENCOUNTER — Other Ambulatory Visit: Payer: Self-pay | Admitting: Cardiology

## 2017-09-11 NOTE — Telephone Encounter (Signed)
Should this be bid or tid as last office visit and current med list have both sigs listed? Please advise. Thanks, MI

## 2017-11-02 ENCOUNTER — Inpatient Hospital Stay (HOSPITAL_COMMUNITY): Payer: BLUE CROSS/BLUE SHIELD

## 2017-11-02 ENCOUNTER — Inpatient Hospital Stay (HOSPITAL_COMMUNITY)
Admission: EM | Admit: 2017-11-02 | Discharge: 2017-11-08 | DRG: 674 | Disposition: A | Discharge status: Home / Self Care | Payer: BLUE CROSS/BLUE SHIELD | Attending: Internal Medicine | Admitting: Internal Medicine

## 2017-11-02 ENCOUNTER — Encounter (HOSPITAL_COMMUNITY): Payer: Self-pay | Admitting: Emergency Medicine

## 2017-11-02 ENCOUNTER — Other Ambulatory Visit: Payer: Self-pay

## 2017-11-02 DIAGNOSIS — E785 Hyperlipidemia, unspecified: Secondary | ICD-10-CM | POA: Diagnosis present

## 2017-11-02 DIAGNOSIS — Z6841 Body Mass Index (BMI) 40.0 and over, adult: Secondary | ICD-10-CM

## 2017-11-02 DIAGNOSIS — Z419 Encounter for procedure for purposes other than remedying health state, unspecified: Secondary | ICD-10-CM

## 2017-11-02 DIAGNOSIS — D631 Anemia in chronic kidney disease: Secondary | ICD-10-CM

## 2017-11-02 DIAGNOSIS — Z01810 Encounter for preprocedural cardiovascular examination: Secondary | ICD-10-CM

## 2017-11-02 DIAGNOSIS — I132 Hypertensive heart and chronic kidney disease with heart failure and with stage 5 chronic kidney disease, or end stage renal disease: Secondary | ICD-10-CM | POA: Diagnosis present

## 2017-11-02 DIAGNOSIS — G4733 Obstructive sleep apnea (adult) (pediatric): Secondary | ICD-10-CM | POA: Diagnosis present

## 2017-11-02 DIAGNOSIS — N179 Acute kidney failure, unspecified: Principal | ICD-10-CM | POA: Diagnosis present

## 2017-11-02 DIAGNOSIS — Z794 Long term (current) use of insulin: Secondary | ICD-10-CM

## 2017-11-02 DIAGNOSIS — I5032 Chronic diastolic (congestive) heart failure: Secondary | ICD-10-CM | POA: Diagnosis present

## 2017-11-02 DIAGNOSIS — M109 Gout, unspecified: Secondary | ICD-10-CM | POA: Diagnosis present

## 2017-11-02 DIAGNOSIS — Z888 Allergy status to other drugs, medicaments and biological substances status: Secondary | ICD-10-CM | POA: Diagnosis not present

## 2017-11-02 DIAGNOSIS — R7989 Other specified abnormal findings of blood chemistry: Secondary | ICD-10-CM

## 2017-11-02 DIAGNOSIS — E119 Type 2 diabetes mellitus without complications: Secondary | ICD-10-CM

## 2017-11-02 DIAGNOSIS — N186 End stage renal disease: Secondary | ICD-10-CM | POA: Diagnosis present

## 2017-11-02 DIAGNOSIS — N185 Chronic kidney disease, stage 5: Secondary | ICD-10-CM | POA: Diagnosis not present

## 2017-11-02 DIAGNOSIS — Z9989 Dependence on other enabling machines and devices: Secondary | ICD-10-CM | POA: Diagnosis not present

## 2017-11-02 DIAGNOSIS — Z992 Dependence on renal dialysis: Secondary | ICD-10-CM | POA: Diagnosis not present

## 2017-11-02 DIAGNOSIS — I420 Dilated cardiomyopathy: Secondary | ICD-10-CM | POA: Diagnosis present

## 2017-11-02 DIAGNOSIS — Z79899 Other long term (current) drug therapy: Secondary | ICD-10-CM | POA: Diagnosis not present

## 2017-11-02 DIAGNOSIS — Z95828 Presence of other vascular implants and grafts: Secondary | ICD-10-CM

## 2017-11-02 DIAGNOSIS — E1122 Type 2 diabetes mellitus with diabetic chronic kidney disease: Secondary | ICD-10-CM | POA: Diagnosis present

## 2017-11-02 DIAGNOSIS — I10 Essential (primary) hypertension: Secondary | ICD-10-CM

## 2017-11-02 DIAGNOSIS — N19 Unspecified kidney failure: Secondary | ICD-10-CM | POA: Diagnosis present

## 2017-11-02 DIAGNOSIS — Z87891 Personal history of nicotine dependence: Secondary | ICD-10-CM

## 2017-11-02 DIAGNOSIS — N2581 Secondary hyperparathyroidism of renal origin: Secondary | ICD-10-CM | POA: Diagnosis present

## 2017-11-02 LAB — CBC
HCT: 31.9 % — ABNORMAL LOW (ref 39.0–52.0)
Hemoglobin: 9.9 g/dL — ABNORMAL LOW (ref 13.0–17.0)
MCH: 29.3 pg (ref 26.0–34.0)
MCHC: 31 g/dL (ref 30.0–36.0)
MCV: 94.4 fL (ref 78.0–100.0)
Platelets: 172 10*3/uL (ref 150–400)
RBC: 3.38 MIL/uL — ABNORMAL LOW (ref 4.22–5.81)
RDW: 16.5 % — ABNORMAL HIGH (ref 11.5–15.5)
WBC: 5.2 10*3/uL (ref 4.0–10.5)

## 2017-11-02 LAB — URINALYSIS, ROUTINE W REFLEX MICROSCOPIC
Bacteria, UA: NONE SEEN
Bilirubin Urine: NEGATIVE
Glucose, UA: 50 mg/dL — AB
Hgb urine dipstick: NEGATIVE
Ketones, ur: 5 mg/dL — AB
Leukocytes, UA: NEGATIVE
Nitrite: NEGATIVE
Protein, ur: 100 mg/dL — AB
Specific Gravity, Urine: 1.011 (ref 1.005–1.030)
pH: 5 (ref 5.0–8.0)

## 2017-11-02 LAB — PHOSPHORUS: Phosphorus: 11.6 mg/dL — ABNORMAL HIGH (ref 2.5–4.6)

## 2017-11-02 LAB — GLUCOSE, CAPILLARY: Glucose-Capillary: 91 mg/dL (ref 65–99)

## 2017-11-02 LAB — COMPREHENSIVE METABOLIC PANEL
ALT: 11 U/L — ABNORMAL LOW (ref 17–63)
AST: 9 U/L — ABNORMAL LOW (ref 15–41)
Albumin: 4.3 g/dL (ref 3.5–5.0)
Alkaline Phosphatase: 41 U/L (ref 38–126)
Anion gap: 23 — ABNORMAL HIGH (ref 5–15)
BUN: 143 mg/dL — ABNORMAL HIGH (ref 6–20)
CO2: 20 mmol/L — ABNORMAL LOW (ref 22–32)
Calcium: 9.6 mg/dL (ref 8.9–10.3)
Chloride: 96 mmol/L — ABNORMAL LOW (ref 101–111)
Creatinine, Ser: 24.42 mg/dL — ABNORMAL HIGH (ref 0.61–1.24)
GFR calc Af Amer: 2 mL/min — ABNORMAL LOW (ref >60)
GFR calc non Af Amer: 2 mL/min — ABNORMAL LOW (ref >60)
Glucose, Bld: 88 mg/dL (ref 65–99)
Potassium: 3.8 mmol/L (ref 3.5–5.1)
Sodium: 139 mmol/L (ref 135–145)
Total Bilirubin: 1.1 mg/dL (ref 0.3–1.2)
Total Protein: 7.2 g/dL (ref 6.5–8.1)

## 2017-11-02 LAB — CBG MONITORING, ED: Glucose-Capillary: 88 mg/dL (ref 65–99)

## 2017-11-02 LAB — HEMOGLOBIN A1C
Hgb A1c MFr Bld: 4.7 % — ABNORMAL LOW (ref 4.8–5.6)
Mean Plasma Glucose: 88.19 mg/dL

## 2017-11-02 LAB — PROTIME-INR
INR: 1.26
Prothrombin Time: 15.7 seconds — ABNORMAL HIGH (ref 11.4–15.2)

## 2017-11-02 LAB — MAGNESIUM: Magnesium: 3.3 mg/dL — ABNORMAL HIGH (ref 1.7–2.4)

## 2017-11-02 MED ORDER — INSULIN ASPART 100 UNIT/ML ~~LOC~~ SOLN: 0.0000 [IU] | Freq: Every day | SUBCUTANEOUS | Status: DC

## 2017-11-02 MED ORDER — ACETAMINOPHEN 325 MG PO TABS
650.0000 mg | ORAL_TABLET | Freq: Four times a day (QID) | ORAL | Status: DC | PRN
  Administered 2017-11-03: 650 mg via ORAL
  Filled 2017-11-02: qty 2

## 2017-11-02 MED ORDER — LANTHANUM CARBONATE 500 MG PO CHEW
1000.0000 mg | CHEWABLE_TABLET | Freq: Three times a day (TID) | ORAL | Status: DC
  Administered 2017-11-02 – 2017-11-07 (×8): 1000 mg via ORAL
  Filled 2017-11-02 (×14): qty 2

## 2017-11-02 MED ORDER — HYDRALAZINE HCL 20 MG/ML IJ SOLN: 10.0000 mg | Freq: Four times a day (QID) | INTRAMUSCULAR | Status: DC | PRN

## 2017-11-02 MED ORDER — SODIUM CHLORIDE 0.9 % IV SOLN
1.5000 g | INTRAVENOUS | Status: AC
  Filled 2017-11-02: qty 1.5

## 2017-11-02 MED ORDER — NIFEDIPINE ER OSMOTIC RELEASE 30 MG PO TB24
30.0000 mg | ORAL_TABLET | Freq: Every day | ORAL | Status: DC
  Administered 2017-11-02 – 2017-11-08 (×4): 30 mg via ORAL
  Filled 2017-11-02 (×7): qty 1

## 2017-11-02 MED ORDER — ALLOPURINOL 300 MG PO TABS: 300.0000 mg | ORAL_TABLET | Freq: Every day | ORAL | Status: DC | PRN

## 2017-11-02 MED ORDER — ONDANSETRON HCL 4 MG/2ML IJ SOLN: 4.0000 mg | Freq: Four times a day (QID) | INTRAMUSCULAR | Status: DC | PRN

## 2017-11-02 MED ORDER — HYDRALAZINE HCL 25 MG PO TABS
100.0000 mg | ORAL_TABLET | Freq: Three times a day (TID) | ORAL | Status: DC
  Administered 2017-11-02 – 2017-11-07 (×10): 100 mg via ORAL
  Filled 2017-11-02 (×14): qty 4

## 2017-11-02 MED ORDER — HEPARIN SODIUM (PORCINE) 5000 UNIT/ML IJ SOLN
5000.0000 [IU] | Freq: Three times a day (TID) | INTRAMUSCULAR | Status: DC
  Filled 2017-11-02 (×8): qty 1

## 2017-11-02 MED ORDER — SIMVASTATIN 20 MG PO TABS
20.0000 mg | ORAL_TABLET | Freq: Every day | ORAL | Status: DC
  Administered 2017-11-02 – 2017-11-07 (×5): 20 mg via ORAL
  Filled 2017-11-02 (×6): qty 1

## 2017-11-02 MED ORDER — CARVEDILOL 25 MG PO TABS
25.0000 mg | ORAL_TABLET | Freq: Two times a day (BID) | ORAL | Status: DC
  Administered 2017-11-02 – 2017-11-07 (×5): 25 mg via ORAL
  Filled 2017-11-02 (×7): qty 1

## 2017-11-02 MED ORDER — INSULIN ASPART 100 UNIT/ML ~~LOC~~ SOLN: 0.0000 [IU] | Freq: Three times a day (TID) | SUBCUTANEOUS | Status: DC

## 2017-11-02 NOTE — Progress Notes (Signed)
Pt admitted to room 5MW09 from ED via bed.  Pt alert oriented X 4.  Independent.  Tele.  Pleasant.  Wife at bedside.

## 2017-11-02 NOTE — Consult Note (Signed)
Reason for Consult: Progressive CKD stage 5 Referring Physician: [place] Crane, MD  Adrian Frye is an 47 y.o. male.  HPI:  Adrian Frye is a 47 yo AAM with PMH significant for DM, HTN, obesity, anemia of chronic disease, and CKD stage 5 who was told to report to [facility] ED due to abnormal labs at his PCP's office.  Adrian Frye has been followed by [facility] who has documented his progressive CKD since May 2018 (was seeing Dr. Mercy Moore prior to that), however Mr. Hawker had refused to have any preparation for dialysis or to discuss renal replacement therapies.  He now understands the need for dialysis and is amenable to proceed with vascular access placement and initiation of hemodialysis.  He has had some anorexia and nausea but no shortness of breath or lower extremity edema.  He states that he didn't want to discuss dialysis because his mother died at age 55 while on dialysis and he had "a bad experience with it and didn't want to think about it".   Trend in Creatinine: Creatinine, Ser  Date/Time Value Ref Range Status  11/02/2017 10:22 AM 24.42 (H) 0.61 - 1.24 mg/dL Final  09/06/2016 08:48 AM 7.84 (H) 0.76 - 1.27 mg/dL Final  07/01/2016 07:48 PM 7.38 (H) 0.61 - 1.24 mg/dL Final  02/21/2014 08:50 AM 2.9 (H) 0.4 - 1.5 mg/dL Final  02/07/2014 08:25 AM 3.1 (H) 0.4 - 1.5 mg/dL Final  01/17/2014 08:55 AM 2.8 (H) 0.4 - 1.5 mg/dL Final  09/17/2009 05:35 AM 3.35 (H) 0.4 - 1.5 mg/dL Final  09/16/2009 05:44 AM 3.56 (H) 0.4 - 1.5 mg/dL Final  09/15/2009 02:47 PM 3.97 (H) 0.4 - 1.5 mg/dL Final  09/14/2009 10:57 PM 5.3 (H) 0.4 - 1.5 mg/dL Final  11/13/2008 08:15 AM 3.58 (H) 0.4 - 1.5 mg/dL Final  11/12/2008 05:50 AM 3.38 (H) 0.4 - 1.5 mg/dL Final  11/11/2008 05:21 AM 2.72 (H) 0.4 - 1.5 mg/dL Final  11/10/2008 09:56 AM 2.9 (H) 0.4 - 1.5 mg/dL Final    PMH:   Past Medical History:  Diagnosis Date  . Allergic rhinitis   . Asthma   . Chronic kidney disease    stage III-IV  . Diabetes mellitus  without complication ([place])   . Dilated idiopathic cardiomyopathy ([place])    now resoved with EF 55% by echo 2011  . Edema   . Hyperparathyroidism, secondary ([place])   . Hypertension   . Morbid obesity ([place])   . Sleep apnea   . UGI bleed 2011   ASA    PSH:   Past Surgical History:  Procedure Laterality Date  . ACHILLES TENDON REPAIR     ruptured; right  . PLEURAL SCARIFICATION     left, football trauma-chest tube  . TONSILLECTOMY      Allergies:  Allergies  Allergen Reactions  . Lisinopril Swelling    Angioedema   . Losartan Swelling    Medications:   Prior to Admission medications   Medication Sig Start Date End Date Taking? Authorizing Provider  allopurinol (ZYLOPRIM) 300 MG tablet Take 300 mg by mouth daily as needed (gout flare ups).  11/25/14  Yes [provider]  CAL-GEST ANTACID 500 MG chewable tablet Chew 1,000 mg by mouth 3 (three) times daily with meals. 10/04/17  Yes [provider]  calcitRIOL (ROCALTROL) 0.25 MCG capsule Take 0.5 mcg by mouth daily after lunch.  11/25/14  Yes [provider]  carvedilol (COREG) 25 MG tablet Take 25 mg by mouth 2 (two) times  daily.     Yes [provider]  colchicine 0.6 MG tablet Take 0.6 mg by mouth daily as needed (for gout).  10/09/14  Yes [provider]  epoetin alfa (EPOGEN,PROCRIT) 93235 UNIT/ML injection Inject 40,000 Units into the skin as directed. 08/21/17  Yes [provider]  furosemide (LASIX) 80 MG tablet Take 160 mg by mouth See admin instructions. Take 2 tablets (160 mg) by mouth twice daily - morning and lunch   Yes [provider]  hydrALAZINE (APRESOLINE) 100 MG tablet TAKE 1 TABLET (100 MG TOTAL) BY MOUTH 3 (THREE) TIMES DAILY. 09/11/17  Yes Turner, Eber Hong, MD  Multiple Vitamin (MULTIVITAMIN WITH MINERALS) TABS tablet Take 1 tablet by mouth daily. "PhytoMulti" from [place]   Yes [provider]  NIFEdipine (PROCARDIA-XL/ADALAT-CC/NIFEDICAL-XL)  30 MG 24 hr tablet Take 30 mg by mouth daily.   Yes [provider]  potassium chloride SA (K-DUR,KLOR-CON) 20 MEQ tablet Take 20 mEq by mouth daily. Take 1 tablet (20 meq) by mouth daily 02/28/14  Yes Turner, Eber Hong, MD  PRESCRIPTION MEDICATION Inhale into the lungs at bedtime. CPAP   Yes [provider]  simvastatin (ZOCOR) 20 MG tablet Take 20 mg by mouth at bedtime.  01/09/11  Yes [provider]  zolpidem (AMBIEN) 10 MG tablet Take 1 tablet (10 mg total) by mouth at bedtime as needed for sleep. Patient taking differently: Take 10 mg by mouth at bedtime.  02/21/14  Yes Sueanne Margarita, MD    Inpatient medications: . carvedilol  25 mg Oral BID  . hydrALAZINE  100 mg Oral TID  . insulin aspart  0-15 Units Subcutaneous TID WC  . insulin aspart  0-5 Units Subcutaneous QHS  . NIFEdipine  30 mg Oral Daily  . simvastatin  20 mg Oral QHS    Discontinued Meds:   Medications Discontinued During This Encounter  Medication Reason  . OVER THE COUNTER MEDICATION No longer needed (for PRN medications)  . OVER THE COUNTER MEDICATION No longer needed (for PRN medications)  . OVER THE COUNTER MEDICATION No longer needed (for PRN medications)  . OVER THE COUNTER MEDICATION No longer needed (for PRN medications)    Social History:  reports that he quit smoking about 8 years ago. His smoking use included cigarettes. He has never used smokeless tobacco. He reports that he drinks about 1.2 oz of alcohol per week. He reports that he does not use drugs.  Family History:   Family History  Problem Relation Age of Onset  . Diabetes Mother   . Heart failure Mother   . Hypertension Mother   . Heart disease Father   . Hypertension Brother     Pertinent items are noted in HPI. Weight change:  No intake or output data in the 24 hours ending 11/02/17 1413 BP (!) 166/80   Pulse 67   Temp 98.2 F (36.8 C) (Oral)   Resp 17   SpO2 95%  Vitals:   11/02/17 1200 11/02/17 1230  11/02/17 1300 11/02/17 1330  BP: (!) 174/86 (!) 163/80 (!) 172/88 (!) 166/80  Pulse: 64 65 65 67  Resp: (!) 22 17 15 17   Temp:      TempSrc:      SpO2: 93% 95% 97% 95%     General appearance: alert, cooperative and no distress Head: Normocephalic, without obvious abnormality, atraumatic Eyes: negative findings: lids and lashes normal, conjunctivae and sclerae normal and corneas clear Resp: clear to auscultation bilaterally Cardio: regular  rate and rhythm, S1, S2 normal, no murmur, click, rub or gallop GI: soft, non-tender; bowel sounds normal; no masses,  no organomegaly Extremities: extremities normal, atraumatic, no cyanosis or edema  Labs: Basic Metabolic Panel: Recent Labs  Lab 11/02/17 1022  NA 139  K 3.8  CL 96*  CO2 20*  GLUCOSE 88  BUN 143*  CREATININE 24.42*  ALBUMIN 4.3  CALCIUM 9.6  PHOS 11.6*   Liver Function Tests: Recent Labs  Lab 11/02/17 1022  AST 9*  ALT 11*  ALKPHOS 41  BILITOT 1.1  PROT 7.2  ALBUMIN 4.3   No results for input(s): LIPASE, AMYLASE in the last 168 hours. No results for input(s): AMMONIA in the last 168 hours. CBC: Recent Labs  Lab 11/02/17 1022  WBC 5.2  HGB 9.9*  HCT 31.9*  MCV 94.4  PLT 172   PT/INR: @LABRCNTIP (inr:5) Cardiac Enzymes: )No results for input(s): CKTOTAL, CKMB, CKMBINDEX, TROPONINI in the last 168 hours. CBG: Recent Labs  Lab 11/02/17 1221  GLUCAP 88    Iron Studies: No results for input(s): IRON, TIBC, TRANSFERRIN, FERRITIN in the last 168 hours.  Xrays/Other Studies: No results found.   Assessment/Plan: 1.  End stage renal disease/CKD stage 5- he is now with uremic symptoms and is amenable to have vascular access placement and initiate HD.  We also discussed other modalities of RRT including incenter HD, home HD, peritoneal dialysis as well as kidney transplantation.  For now we will consult [facility] for [facility] and AVF/AVG placement (access to be placed either today or tomorrow as there is no emergent  need) and initiate dialysis after access has been established.  Hopefully he can have both procedures performed at the same time is OR time available for both.  Will order vein mapping and have consulted [facility]. 2. Anemia of CKD stage 5- will start ESA therapy with HD and follow iron stores 3. Secondary HPTH- iPTH was 163 on 08/18/17, phos 7.6, ca 9 will start binders and vit D 4. HTN- continue outpatient meds and UF with HD 5. DM- per primary 6. Vascular access- as above 7. Disposition- will start the CLIP process and continue educating about RRT options.  He still works and may be interested in home therapies   Donetta Potts 11/02/2017, 2:13 PM

## 2017-11-02 NOTE — ED Notes (Signed)
IV team at bedside 

## 2017-11-02 NOTE — ED Provider Notes (Signed)
[facility] EMERGENCY DEPARTMENT Provider Note   CSN: [id number] Arrival date & time: 11/02/17  0945     History   Chief Complaint Chief Complaint  Patient presents with  . Abnormal Lab    HPI Adrian Frye is a 47 y.o. male.  47 year old male with chronic CKD diabetes here with increased malaise and lack of appetite.  He has been told by his nephrologist Dr. Neta Ehlers that he would likely need dialysis but he is been not wanting to start it so far.  He saw his PCP within the last few days and had some labs drawn and his creatinine had gone from 16-21.  After discussion with his PCP and his nephrologist to see presenting here today to be evaluated for dialysis.  He is denying any chest pain shortness of breath vomiting diarrhea fevers chills cough.  The history is provided by the patient.  Abnormal Lab  Time since result:  Yesterday Patient referred by:  PCP and specialist Result type: chemistry   Chemistry:    BUN:  High   Creatinine:  High   Past Medical History:  Diagnosis Date  . Allergic rhinitis   . Asthma   . Chronic kidney disease    stage III-IV  . Diabetes mellitus without complication ([place])   . Dilated idiopathic cardiomyopathy ([place])    now resoved with EF 55% by echo 2011  . Edema   . Hyperparathyroidism, secondary ([place])   . Hypertension   . Morbid obesity ([place])   . Sleep apnea   . UGI bleed 2011   ASA    Patient Active Problem List   Diagnosis Date Noted  . Chronic diastolic heart failure ([place]) 04/17/2013  . Edema   . Hypertension   . Dilated idiopathic cardiomyopathy ([place])   . Morbid obesity ([place]) 08/05/2012  . ASTHMA 10/28/2007  . ALLERGIC RHINITIS 10/18/2007  . Obstructive sleep apnea 10/18/2007    Past Surgical History:  Procedure Laterality Date  . ACHILLES TENDON REPAIR     ruptured; right  . PLEURAL SCARIFICATION     left, football trauma-chest tube  . TONSILLECTOMY          Home Medications    Prior to  Admission medications   Medication Sig Start Date End Date Taking? Authorizing Provider  allopurinol (ZYLOPRIM) 300 MG tablet Take 300 mg by mouth daily after lunch.  11/25/14   [provider]  calcitRIOL (ROCALTROL) 0.25 MCG capsule Take 0.5 mcg by mouth daily after lunch.  11/25/14   [provider]  carvedilol (COREG) 25 MG tablet Take 25 mg by mouth 2 (two) times daily.      [provider]  colchicine 0.6 MG tablet Take 0.6 mg by mouth daily as needed (for gout).  10/09/14   [provider]  furosemide (LASIX) 80 MG tablet Take 160 mg by mouth See admin instructions. Take 2 tablets (160 mg) by mouth twice daily - morning and lunch    [provider]  hydrALAZINE (APRESOLINE) 100 MG tablet TAKE 1 TABLET (100 MG TOTAL) BY MOUTH 3 (THREE) TIMES DAILY. 09/11/17   Sueanne Margarita, MD  Multiple Vitamin (MULTIVITAMIN WITH MINERALS) TABS tablet Take 1 tablet by mouth daily. "PhytoMulti" from [place]    [provider]  NIFEdipine (PROCARDIA-XL/ADALAT-CC/NIFEDICAL-XL) 30 MG 24 hr tablet Take 30 mg by mouth daily.    [provider]  OVER THE COUNTER MEDICATION Elderberry D3Fense from [employer] (50 mcg D3)  [provider]  OVER THE COUNTER MEDICATION Take 1 tablet by mouth daily. Daily Free Amino Acids from [employer] - with Magnesium and L-Carnitine    [provider]  OVER THE COUNTER MEDICATION Take 1 tablet by mouth daily. KB-C - Kidney and Bone support from [employer], Historical, MD  OVER THE COUNTER MEDICATION Take 1 tablet by mouth daily. Raw Calcium (whole food plant formula with calcium, magnesium, vitamins D3 & K2)    [provider]  potassium chloride SA (K-DUR,KLOR-CON) 20 MEQ tablet Take 20 mEq by mouth See admin instructions. Take 1 tablet (20 meq) by mouth twice daily - morning and lunch 02/28/14   Sueanne Margarita, MD  PRESCRIPTION MEDICATION Inhale into the lungs  at bedtime. CPAP    [provider]  simvastatin (ZOCOR) 20 MG tablet Take 20 mg by mouth at bedtime.  01/09/11   [provider]  zolpidem (AMBIEN) 10 MG tablet Take 1 tablet (10 mg total) by mouth at bedtime as needed for sleep. Patient taking differently: Take 10 mg by mouth at bedtime.  02/21/14   Sueanne Margarita, MD    Family History Family History  Problem Relation Age of Onset  . Diabetes Mother   . Heart failure Mother   . Hypertension Mother   . Heart disease Father   . Hypertension Brother     Social History Social History   Tobacco Use  . Smoking status: Former Smoker    Types: Cigarettes    Last attempt to quit: 05/16/2009    Years since quitting: 8.4  . Smokeless tobacco: Never Used  Substance Use Topics  . Alcohol use: Yes    Alcohol/week: 1.2 oz    Types: 2 Cans of beer per week  . Drug use: No     Allergies   Lisinopril   Review of Systems Review of Systems  Constitutional: Positive for activity change, appetite change and fatigue. Negative for chills, diaphoresis and fever.  HENT: Negative for ear pain, rhinorrhea and sore throat.   Eyes: Negative for pain and visual disturbance.  Respiratory: Negative for cough and shortness of breath.   Cardiovascular: Negative for chest pain and palpitations.  Gastrointestinal: Negative for abdominal pain and vomiting.  Genitourinary: Negative for dysuria and hematuria.  Musculoskeletal: Negative for arthralgias and back pain.  Skin: Negative for color change and rash.  Neurological: Negative for seizures and syncope.  All other systems reviewed and are negative.    Physical Exam Updated Vital Signs BP (!) 156/88 (BP Location: Right Wrist)   Pulse 63   Temp 98.2 F (36.8 C) (Oral)   Resp 15   SpO2 97%   Physical Exam  Constitutional: He appears well-developed and well-nourished.  HENT:  Head: Normocephalic and atraumatic.  Right Ear: External ear normal.  Left Ear: External ear  normal.  Nose: Nose normal.  Mouth/Throat: Oropharynx is clear and moist.  Eyes: Pupils are equal, round, and reactive to light. Conjunctivae and EOM are normal.  Neck: Neck supple.  Cardiovascular: Normal rate and regular rhythm.  No murmur heard. Pulmonary/Chest: Effort normal and breath sounds normal. No respiratory distress.  Abdominal: Soft. There is no tenderness.  Musculoskeletal: Normal range of motion. He exhibits no edema, tenderness or deformity.  Neurological: He is alert.  Skin: Skin is warm and dry. Capillary refill takes less than 2 seconds.  Psychiatric: He has a normal mood and affect.  Nursing note and vitals reviewed.  ED Treatments / Results  Labs (all labs ordered are listed, but only abnormal results are displayed) Labs Reviewed  CBC - Abnormal; Notable for the following components:      Result Value   RBC 3.38 (*)    Hemoglobin 9.9 (*)    HCT 31.9 (*)    RDW 16.5 (*)    All other components within normal limits  URINALYSIS, ROUTINE W REFLEX MICROSCOPIC  COMPREHENSIVE METABOLIC PANEL  CBG MONITORING, ED    EKG EKG Interpretation  Date/Time:  Thursday November 02 2017 10:51:42 EDT Ventricular Rate:  63 PR Interval:    QRS Duration: 111 QT Interval:  452 QTC Calculation: 463 R Axis:   6 Text Interpretation:  Sinus rhythm Incomplete left bundle branch block Low voltage, precordial leads ST elev, probable normal early repol pattern Baseline wander in lead(s) V4 V5 V6 similar to prior 2/18 Confirmed by Aletta Edouard [phone]) on 11/02/2017 11:13:56 AM   Radiology No results found.  Procedures Procedures (including critical care time)  Medications Ordered in ED Medications - No data to display   Initial Impression / Assessment and Plan / ED Course  I have reviewed the triage vital signs and the nursing notes.  Pertinent labs & imaging results that were available during my care of the patient were reviewed by me and considered in my medical  decision making (see chart for details).  Clinical Course as of Nov 03 709  Thu Nov 02, 2017  1136 Discussed with nephrology who will consult on the patient.  They recommend admitting to medicine and they will evaluate for dialysis.  She did ask if we can get a hepatitis surface B antigen.   [MB]  1227 Discussed with Dr. Nevada Crane from the medicine hospitalist service who will accept to their service   [MB]    Clinical Course User Index [MB] Hayden Rasmussen, MD     Final Clinical Impressions(s) / ED Diagnoses   Final diagnoses:  Acute renal failure, unspecified acute renal failure type [facility])    ED Discharge Orders    None       Hayden Rasmussen, MD 11/03/17 [phone]

## 2017-11-02 NOTE — Progress Notes (Signed)
UE VEIN MAPPING PRELIM      Adrian Frye, RDMS, RVT

## 2017-11-02 NOTE — Consult Note (Addendum)
[facility] NOTE   MRN : [id number]  Reason for Consult: ESRD acute on CKD Referring Physician: Dr. Marval Regal  History of Present Illness: 47 y/o male with Acute renal failure reports to the ED with generalized weakness and poor appetite.  We have been asked to place a [facility] and permanent access.  At admission his CR is 24 and his BUN 143.  His baseline Cr is 14.    He is not in distress alert and oriented x 3.  He denise SOB, CP, V/N/D.  He has not had any chest implants.  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  He last ate yesterday and has had a few sips of water since being in the ED.  He is right hand dominant.  Vein mapping has been ordered.    Past medical history includes:HTN, DM, CKD, obesity and chronic diastolic CHF.     No current facility-administered medications for this encounter.    Current Outpatient Medications  Medication Sig Dispense Refill  . allopurinol (ZYLOPRIM) 300 MG tablet Take 300 mg by mouth daily as needed (gout flare ups).   6  . CAL-GEST ANTACID 500 MG chewable tablet Chew 1,000 mg by mouth 3 (three) times daily with meals.  3  . calcitRIOL (ROCALTROL) 0.25 MCG capsule Take 0.5 mcg by mouth daily after lunch.   6  . carvedilol (COREG) 25 MG tablet Take 25 mg by mouth 2 (two) times daily.      . colchicine 0.6 MG tablet Take 0.6 mg by mouth daily as needed (for gout).   5  . epoetin alfa (EPOGEN,PROCRIT) 85277 UNIT/ML injection Inject 40,000 Units into the skin as directed.    . furosemide (LASIX) 80 MG tablet Take 160 mg by mouth See admin instructions. Take 2 tablets (160 mg) by mouth twice daily - morning and lunch    . hydrALAZINE (APRESOLINE) 100 MG tablet TAKE 1 TABLET (100 MG TOTAL) BY MOUTH 3 (THREE) TIMES DAILY. 90 tablet 5  . Multiple Vitamin (MULTIVITAMIN WITH MINERALS) TABS tablet Take 1 tablet by mouth daily. "PhytoMulti" from [place]    . NIFEdipine  (PROCARDIA-XL/ADALAT-CC/NIFEDICAL-XL) 30 MG 24 hr tablet Take 30 mg by mouth daily.    . potassium chloride SA (K-DUR,KLOR-CON) 20 MEQ tablet Take 20 mEq by mouth daily. Take 1 tablet (20 meq) by mouth daily    . PRESCRIPTION MEDICATION Inhale into the lungs at bedtime. CPAP    . simvastatin (ZOCOR) 20 MG tablet Take 20 mg by mouth at bedtime.     Marland Kitchen zolpidem (AMBIEN) 10 MG tablet Take 1 tablet (10 mg total) by mouth at bedtime as needed for sleep. (Patient taking differently: Take 10 mg by mouth at bedtime. ) 30 tablet 0    Pt meds include: Statin :Yes Betablocker: Yes ASA: No Other anticoagulants/antiplatelets: none  Past Medical History:  Diagnosis Date  . Allergic rhinitis   . Asthma   . Chronic kidney disease    stage III-IV  . Diabetes mellitus without complication ([place])   . Dilated idiopathic cardiomyopathy ([place])    now resoved with EF 55% by echo 2011  . Edema   . Hyperparathyroidism, secondary ([place])   . Hypertension   . Morbid obesity ([place])   . Sleep apnea   . UGI bleed 2011   ASA    Past Surgical History:  Procedure Laterality Date  . ACHILLES TENDON REPAIR     ruptured;  right  . PLEURAL SCARIFICATION     left, football trauma-chest tube  . TONSILLECTOMY      Social History Social History   Tobacco Use  . Smoking status: Former Smoker    Types: Cigarettes    Last attempt to quit: 05/16/2009    Years since quitting: 8.4  . Smokeless tobacco: Never Used  Substance Use Topics  . Alcohol use: Yes    Alcohol/week: 1.2 oz    Types: 2 Cans of beer per week  . Drug use: No    Family History Family History  Problem Relation Age of Onset  . Diabetes Mother   . Heart failure Mother   . Hypertension Mother   . Heart disease Father   . Hypertension Brother     Allergies  Allergen Reactions  . Lisinopril Swelling    Angioedema   . Losartan Swelling     REVIEW OF SYSTEMS  General: [ ]  Weight loss, [ ]  Fever, [ ]  chills Neurologic: [ ]  Dizziness, [  ] Blackouts, [ ]  Seizure [ ]  Stroke, [ ]  "Mini stroke", [ ]  Slurred speech, [ ]  Temporary blindness; [ ]  weakness in arms or legs, [ ]  Hoarseness [ ]  Dysphagia Cardiac: [ ]  Chest pain/pressure, [ ]  Shortness of breath at rest [ ]  Shortness of breath with exertion, [ ]  Atrial fibrillation or irregular heartbeat  Vascular: [ ]  Pain in legs with walking, [ ]  Pain in legs at rest, [ ]  Pain in legs at night,  [ ]  Non-healing ulcer, [ ]  Blood clot in vein/DVT,   Pulmonary: [ ]  Home oxygen, [ ]  Productive cough, [ ]  Coughing up blood, [ ]  Asthma,  [ ]  Wheezing [ ]  COPD Musculoskeletal:  [ ]  Arthritis, [ ]  Low back pain, [ ]  Joint pain Hematologic: [ ]  Easy Bruising, [ ]  Anemia; [ ]  Hepatitis Gastrointestinal: [ ]  Blood in stool, [ ]  Gastroesophageal Reflux/heartburn, Urinary: [ ]  chronic Kidney disease, [ ]  on HD - [ ]  MWF or [ ]  TTHS, [ ]  Burning with urination, [ ]  Difficulty urinating Skin: [ ]  Rashes, [ ]  Wounds Psychological: [ ]  Anxiety, [ ]  Depression  Physical Examination Vitals:   11/02/17 1130 11/02/17 1200 11/02/17 1230 11/02/17 1300  BP: (!) 166/90 (!) 174/86 (!) 163/80 (!) 172/88  Pulse: 63 64 65 65  Resp: (!) 21 (!) 22 17 15   Temp:      TempSrc:      SpO2: 92% 93% 95% 97%   There is no height or weight on file to calculate BMI.  General:  WDWN in NAD HENT: WNL, normocephalic  Eyes: Pupils equal Pulmonary: normal non-labored breathing , without Rales, rhonchi,  wheezing Cardiac: RRR, without  Murmurs, rubs or gallops; No carotid bruits Abdomen: soft, NT, no masses Skin: no rashes, ulcers noted;  no Gangrene , no cellulitis; no open wounds;   Vascular Exam/Pulses:Palpable radial, brachial, DP pulses B   Musculoskeletal: no muscle wasting or atrophy; no edema  Neurologic: A&O X 3; Appropriate Affect ;  SENSATION: normal; MOTOR FUNCTION: 5/5 Symmetric Speech is fluent/normal   Significant Diagnostic Studies: CBC Lab Results  Component Value Date   WBC 5.2  11/02/2017   HGB 9.9 (L) 11/02/2017   HCT 31.9 (L) 11/02/2017   MCV 94.4 11/02/2017   PLT 172 11/02/2017    BMET    Component Value Date/Time   NA 139 11/02/2017 1022   NA 141 09/06/2016 0848   K 3.8 11/02/2017 1022  CL 96 (L) 11/02/2017 1022   CO2 20 (L) 11/02/2017 1022   GLUCOSE 88 11/02/2017 1022   BUN 143 (H) 11/02/2017 1022   BUN 90 (HH) 09/06/2016 0848   CREATININE 24.42 (H) 11/02/2017 1022   CALCIUM 9.6 11/02/2017 1022   CALCIUM 8.1 (L) 11/12/2008 1600   GFRNONAA 2 (L) 11/02/2017 1022   GFRAA 2 (L) 11/02/2017 1022   CrCl cannot be calculated (Unknown ideal weight.).  COAG Lab Results  Component Value Date   INR 1.26 11/02/2017   INR 1.07 09/14/2009     Non-Invasive Vascular Imaging:  Pending vein mapping  ASSESSMENT/PLAN:  Acute on chronic kidney disease He is right hand dominant, so we will plan left UE av fistula verse graft.  He is not in any acute distress alert and oriented.  We will plan surgery for tomorrow to include placement of [facility] with Dr. Donnetta Hutching.  NPO past MN.   Roxy Horseman 11/02/2017 1:34 PM    I have interviewed patient with PA and agree with assessment and plan above. Vein mapping ordered as no evident veins on exam. tdc and left arm avf vs graft planned for tomorrow in OR.  Sophie Quiles C. Donzetta Matters, MD [facility] [place] Office: [phone] Pager: [phone]

## 2017-11-02 NOTE — ED Notes (Signed)
Pt wanting IV to do blood and IV due to difficult start. EDP aware. Pt refused chest xray until EDP speaks to him

## 2017-11-02 NOTE — H&P (View-Only) (Signed)
[facility] NOTE   MRN : [id number]  Reason for Consult: ESRD acute on CKD Referring Physician: Dr. Marval Regal  History of Present Illness: 47 y/o male with Acute renal failure reports to the ED with generalized weakness and poor appetite.  We have been asked to place a [facility] and permanent access.  At admission his CR is 24 and his BUN 143.  His baseline Cr is 14.    He is not in distress alert and oriented x 3.  He denise SOB, CP, V/N/D.  He has not had any chest implants.  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  He last ate yesterday and has had a few sips of water since being in the ED.  He is right hand dominant.  Vein mapping has been ordered.    Past medical history includes:HTN, DM, CKD, obesity and chronic diastolic CHF.     No current facility-administered medications for this encounter.    Current Outpatient Medications  Medication Sig Dispense Refill  . allopurinol (ZYLOPRIM) 300 MG tablet Take 300 mg by mouth daily as needed (gout flare ups).   6  . CAL-GEST ANTACID 500 MG chewable tablet Chew 1,000 mg by mouth 3 (three) times daily with meals.  3  . calcitRIOL (ROCALTROL) 0.25 MCG capsule Take 0.5 mcg by mouth daily after lunch.   6  . carvedilol (COREG) 25 MG tablet Take 25 mg by mouth 2 (two) times daily.      . colchicine 0.6 MG tablet Take 0.6 mg by mouth daily as needed (for gout).   5  . epoetin alfa (EPOGEN,PROCRIT) 10272 UNIT/ML injection Inject 40,000 Units into the skin as directed.    . furosemide (LASIX) 80 MG tablet Take 160 mg by mouth See admin instructions. Take 2 tablets (160 mg) by mouth twice daily - morning and lunch    . hydrALAZINE (APRESOLINE) 100 MG tablet TAKE 1 TABLET (100 MG TOTAL) BY MOUTH 3 (THREE) TIMES DAILY. 90 tablet 5  . Multiple Vitamin (MULTIVITAMIN WITH MINERALS) TABS tablet Take 1 tablet by mouth daily. "PhytoMulti" from [place]    . NIFEdipine  (PROCARDIA-XL/ADALAT-CC/NIFEDICAL-XL) 30 MG 24 hr tablet Take 30 mg by mouth daily.    . potassium chloride SA (K-DUR,KLOR-CON) 20 MEQ tablet Take 20 mEq by mouth daily. Take 1 tablet (20 meq) by mouth daily    . PRESCRIPTION MEDICATION Inhale into the lungs at bedtime. CPAP    . simvastatin (ZOCOR) 20 MG tablet Take 20 mg by mouth at bedtime.     Marland Kitchen zolpidem (AMBIEN) 10 MG tablet Take 1 tablet (10 mg total) by mouth at bedtime as needed for sleep. (Patient taking differently: Take 10 mg by mouth at bedtime. ) 30 tablet 0    Pt meds include: Statin :Yes Betablocker: Yes ASA: No Other anticoagulants/antiplatelets: none  Past Medical History:  Diagnosis Date  . Allergic rhinitis   . Asthma   . Chronic kidney disease    stage III-IV  . Diabetes mellitus without complication ([place])   . Dilated idiopathic cardiomyopathy ([place])    now resoved with EF 55% by echo 2011  . Edema   . Hyperparathyroidism, secondary ([place])   . Hypertension   . Morbid obesity ([place])   . Sleep apnea   . UGI bleed 2011   ASA    Past Surgical History:  Procedure Laterality Date  . ACHILLES TENDON REPAIR     ruptured;  right  . PLEURAL SCARIFICATION     left, football trauma-chest tube  . TONSILLECTOMY      Social History Social History   Tobacco Use  . Smoking status: Former Smoker    Types: Cigarettes    Last attempt to quit: 05/16/2009    Years since quitting: 8.4  . Smokeless tobacco: Never Used  Substance Use Topics  . Alcohol use: Yes    Alcohol/week: 1.2 oz    Types: 2 Cans of beer per week  . Drug use: No    Family History Family History  Problem Relation Age of Onset  . Diabetes Mother   . Heart failure Mother   . Hypertension Mother   . Heart disease Father   . Hypertension Brother     Allergies  Allergen Reactions  . Lisinopril Swelling    Angioedema   . Losartan Swelling     REVIEW OF SYSTEMS  General: [ ]  Weight loss, [ ]  Fever, [ ]  chills Neurologic: [ ]  Dizziness, [  ] Blackouts, [ ]  Seizure [ ]  Stroke, [ ]  "Mini stroke", [ ]  Slurred speech, [ ]  Temporary blindness; [ ]  weakness in arms or legs, [ ]  Hoarseness [ ]  Dysphagia Cardiac: [ ]  Chest pain/pressure, [ ]  Shortness of breath at rest [ ]  Shortness of breath with exertion, [ ]  Atrial fibrillation or irregular heartbeat  Vascular: [ ]  Pain in legs with walking, [ ]  Pain in legs at rest, [ ]  Pain in legs at night,  [ ]  Non-healing ulcer, [ ]  Blood clot in vein/DVT,   Pulmonary: [ ]  Home oxygen, [ ]  Productive cough, [ ]  Coughing up blood, [ ]  Asthma,  [ ]  Wheezing [ ]  COPD Musculoskeletal:  [ ]  Arthritis, [ ]  Low back pain, [ ]  Joint pain Hematologic: [ ]  Easy Bruising, [ ]  Anemia; [ ]  Hepatitis Gastrointestinal: [ ]  Blood in stool, [ ]  Gastroesophageal Reflux/heartburn, Urinary: [ ]  chronic Kidney disease, [ ]  on HD - [ ]  MWF or [ ]  TTHS, [ ]  Burning with urination, [ ]  Difficulty urinating Skin: [ ]  Rashes, [ ]  Wounds Psychological: [ ]  Anxiety, [ ]  Depression  Physical Examination Vitals:   11/02/17 1130 11/02/17 1200 11/02/17 1230 11/02/17 1300  BP: (!) 166/90 (!) 174/86 (!) 163/80 (!) 172/88  Pulse: 63 64 65 65  Resp: (!) 21 (!) 22 17 15   Temp:      TempSrc:      SpO2: 92% 93% 95% 97%   There is no height or weight on file to calculate BMI.  General:  WDWN in NAD HENT: WNL, normocephalic  Eyes: Pupils equal Pulmonary: normal non-labored breathing , without Rales, rhonchi,  wheezing Cardiac: RRR, without  Murmurs, rubs or gallops; No carotid bruits Abdomen: soft, NT, no masses Skin: no rashes, ulcers noted;  no Gangrene , no cellulitis; no open wounds;   Vascular Exam/Pulses:Palpable radial, brachial, DP pulses B   Musculoskeletal: no muscle wasting or atrophy; no edema  Neurologic: A&O X 3; Appropriate Affect ;  SENSATION: normal; MOTOR FUNCTION: 5/5 Symmetric Speech is fluent/normal   Significant Diagnostic Studies: CBC Lab Results  Component Value Date   WBC 5.2  11/02/2017   HGB 9.9 (L) 11/02/2017   HCT 31.9 (L) 11/02/2017   MCV 94.4 11/02/2017   PLT 172 11/02/2017    BMET    Component Value Date/Time   NA 139 11/02/2017 1022   NA 141 09/06/2016 0848   K 3.8 11/02/2017 1022  CL 96 (L) 11/02/2017 1022   CO2 20 (L) 11/02/2017 1022   GLUCOSE 88 11/02/2017 1022   BUN 143 (H) 11/02/2017 1022   BUN 90 (HH) 09/06/2016 0848   CREATININE 24.42 (H) 11/02/2017 1022   CALCIUM 9.6 11/02/2017 1022   CALCIUM 8.1 (L) 11/12/2008 1600   GFRNONAA 2 (L) 11/02/2017 1022   GFRAA 2 (L) 11/02/2017 1022   CrCl cannot be calculated (Unknown ideal weight.).  COAG Lab Results  Component Value Date   INR 1.26 11/02/2017   INR 1.07 09/14/2009     Non-Invasive Vascular Imaging:  Pending vein mapping  ASSESSMENT/PLAN:  Acute on chronic kidney disease He is right hand dominant, so we will plan left UE av fistula verse graft.  He is not in any acute distress alert and oriented.  We will plan surgery for tomorrow to include placement of [facility] with Dr. Donnetta Hutching.  NPO past MN.   Roxy Horseman 11/02/2017 1:34 PM    I have interviewed patient with PA and agree with assessment and plan above. Vein mapping ordered as no evident veins on exam. tdc and left arm avf vs graft planned for tomorrow in OR.  Fenna Semel C. Donzetta Matters, MD [facility] [place] Office: [phone] Pager: [phone]

## 2017-11-02 NOTE — Progress Notes (Signed)
Patient has home CPAP and places it on himself without assistance. RT will monitor as needed.

## 2017-11-02 NOTE — Progress Notes (Signed)
Patient has refused x-ray to be done

## 2017-11-02 NOTE — H&P (Signed)
History and Physical  Adrian Frye URK:[id number] DOB: [birth year] DOA: 11/02/2017  Referring physician: Dr. Melina Copa PCP: Seward Carol, MD  Outpatient Specialists: Dr Quincy Simmonds, nephrology from [facility] Patient coming from: Home Chief Complaint: Generalized weakness and poor appetite  HPI: Adrian Frye is a 47 y.o. male with medical history significant for CKD 5, hypertension, chronic diastolic CHF, OSA, anemia of chronic disease, type 2 diabetes, asthma, morbid obesity, who presented to ED [facility] as recommended by his primary care provider due to worsening creatinine and worsening generalized weakness and poor appetite.  Onset of symptoms 7 days ago and have been gradually worsening.  Went to his PCP yesterday 11/01/17 labs were drawn. Results came back last night for which his primary care provider recommended him to come to the ED due to abnormal labs.  Denies lower extremity edema, chest pain, orthopnea, or dyspnea.  ED Course: On presentation to the ED, VS revealed accelerated hypertension.  Lab studies remarkable for BUN 143, creatinine of 24, and GFR of 2.  Baseline creatinine 7 a year ago.  Reports he still makes urine.  Been declining dialysis for the past year, however today he is agreeable if necessary.  Chest x-ray pending at the time of this visit.  Review of Systems: Review of systems as noted in the HPI.  All other systems reviewed and are negative.   Past Medical History:  Diagnosis Date  . Allergic rhinitis   . Asthma   . Chronic kidney disease    stage III-IV  . Diabetes mellitus without complication ([place])   . Dilated idiopathic cardiomyopathy ([place])    now resoved with EF 55% by echo 2011  . Edema   . Hyperparathyroidism, secondary ([place])   . Hypertension   . Morbid obesity ([place])   . Sleep apnea   . UGI bleed 2011   ASA   Past Surgical History:  Procedure Laterality Date  . ACHILLES TENDON REPAIR     ruptured; right  . PLEURAL SCARIFICATION     left,  football trauma-chest tube  . TONSILLECTOMY      Social History:  reports that he quit smoking about 8 years ago. His smoking use included cigarettes. He has never used smokeless tobacco. He reports that he drinks about 1.2 oz of alcohol per week. He reports that he does not use drugs.   Allergies  Allergen Reactions  . Lisinopril Swelling    Angioedema   . Losartan Swelling    Family History  Problem Relation Age of Onset  . Diabetes Mother   . Heart failure Mother   . Hypertension Mother   . Heart disease Father   . Hypertension Brother     Mother was on dialysis prior to death.  Prior to Admission medications   Medication Sig Start Date End Date Taking? Authorizing Provider  allopurinol (ZYLOPRIM) 300 MG tablet Take 300 mg by mouth daily as needed (gout flare ups).  11/25/14  Yes [provider]  CAL-GEST ANTACID 500 MG chewable tablet Chew 1,000 mg by mouth 3 (three) times daily with meals. 10/04/17  Yes [provider]  calcitRIOL (ROCALTROL) 0.25 MCG capsule Take 0.5 mcg by mouth daily after lunch.  11/25/14  Yes [provider]  carvedilol (COREG) 25 MG tablet Take 25 mg by mouth 2 (two) times daily.     Yes [provider]  colchicine 0.6 MG tablet Take 0.6 mg by mouth daily as needed (for gout).  10/09/14  Yes [provider]  epoetin alfa (EPOGEN,PROCRIT) 57322 UNIT/ML injection Inject 40,000 Units into the skin as directed. 08/21/17  Yes [provider]  furosemide (LASIX) 80 MG tablet Take 160 mg by mouth See admin instructions. Take 2 tablets (160 mg) by mouth twice daily - morning and lunch   Yes [provider]  hydrALAZINE (APRESOLINE) 100 MG tablet TAKE 1 TABLET (100 MG TOTAL) BY MOUTH 3 (THREE) TIMES DAILY. 09/11/17  Yes Turner, Eber Hong, MD  Multiple Vitamin (MULTIVITAMIN WITH MINERALS) TABS tablet Take 1 tablet by mouth daily. "PhytoMulti" from [facility] [place]   Yes [provider]  NIFEdipine  (PROCARDIA-XL/ADALAT-CC/NIFEDICAL-XL) 30 MG 24 hr tablet Take 30 mg by mouth daily.   Yes [provider]  potassium chloride SA (K-DUR,KLOR-CON) 20 MEQ tablet Take 20 mEq by mouth daily. Take 1 tablet (20 meq) by mouth daily 02/28/14  Yes Turner, Eber Hong, MD  PRESCRIPTION MEDICATION Inhale into the lungs at bedtime. CPAP   Yes [provider]  simvastatin (ZOCOR) 20 MG tablet Take 20 mg by mouth at bedtime.  01/09/11  Yes [provider]  zolpidem (AMBIEN) 10 MG tablet Take 1 tablet (10 mg total) by mouth at bedtime as needed for sleep. Patient taking differently: Take 10 mg by mouth at bedtime.  02/21/14  Yes Sueanne Margarita, MD    Physical Exam: BP (!) 172/88   Pulse 65   Temp 98.2 F (36.8 C) (Oral)   Resp 15   SpO2 97%   . General: 47 y.o. year-old male well developed well nourished in no acute distress.  Alert and oriented x3. . Cardiovascular: Regular rate and rhythm with no rubs or gallops.  No thyromegaly or JVD noted.   Marland Kitchen Respiratory: Mild rales at bases with no wheezes. Good inspiratory effort. . Abdomen: Soft nontender nondistended with normal bowel sounds x4 quadrants. . Musculoskeletal: No lower extremity edema. 2/4 pulses in all 4 extremities. . Skin: No ulcerative lesions noted or rashes, . Psychiatry: Mood is appropriate for condition and setting          Labs on Admission:  Basic Metabolic Panel: Recent Labs  Lab 11/02/17 1022  NA 139  K 3.8  CL 96*  CO2 20*  GLUCOSE 88  BUN 143*  CREATININE 24.42*  CALCIUM 9.6  MG 3.3*  PHOS 11.6*   Liver Function Tests: Recent Labs  Lab 11/02/17 1022  AST 9*  ALT 11*  ALKPHOS 41  BILITOT 1.1  PROT 7.2  ALBUMIN 4.3   No results for input(s): LIPASE, AMYLASE in the last 168 hours. No results for input(s): AMMONIA in the last 168 hours. CBC: Recent Labs  Lab 11/02/17 1022  WBC 5.2  HGB 9.9*  HCT 31.9*  MCV 94.4  PLT 172   Cardiac Enzymes: No results for input(s): CKTOTAL,  CKMB, CKMBINDEX, TROPONINI in the last 168 hours.  BNP (last 3 results) No results for input(s): BNP in the last 8760 hours.  ProBNP (last 3 results) No results for input(s): PROBNP in the last 8760 hours.  CBG: Recent Labs  Lab 11/02/17 1221  GLUCAP 88    Radiological Exams on Admission: No results found.  EKG: Independently reviewed.  Personally reviewed EKG which revealed sinus rhythm at a rate of 63.  Assessment/Plan Present on Admission: . Acute prerenal azotemia  Active Problems:   Acute prerenal azotemia  Acute prerenal azotemia/AKI on CKD 5 BUN 124 creatinine of 24 with baseline creatinine of 7 a year ago Being followed outpatient by  nephrologist Dr. Quincy Simmonds at [facility] Previously declined hemodialysis. Today he is agreeable if necessary Monitor urine output ED physician contacted nephrology.  Highly appreciated. Avoid nephrotoxic agents/hypotension/dehydration Repeat BUN and creatinine in the morning  CKD 5 Family history of chronic kidney disease with mother who was previously on dialysis Could be related to uncontrolled hypertension versus diabetes Nephrology has been contacted by ED physician.  Appreciate recommendations  Type 2 diabetes Last A1c 7.1 on on 09/16/2009  Not on antiglycemic medications at home Start insulin sliding scale Obtain hemoglobin A1c Heart healthy diabetic diet Repeat BMP in the morning  Anemia of chronic disease due to advanced CKD Hemoglobin stable at 9 No sign of overt bleeding Repeat CBC in the morning  Accelerated hypertension Continue home medications On Coreg 25 mg twice daily, furosemide 80 mg twice daily Hydralazine 100 mg 3 times daily, nifedipine 30 mg daily Defer restart of furosemide to nephrology  Gout Hold colchicine 0.6 mg tablet due to AKI Continue allopurinol  Hyperlipidemia Continue Zocor  Morbid obesity Weight loss outpatient When more stable consult PT to assess  OSA CPAP at night  Risks:  Moderate to severe due to AKI on advanced CKD, azotemia, anemia of chronic disease, uncontrolled hypertension, multiple comorbidities, and morbid obesity.   DVT prophylaxis: Heparin subcu 3 times daily  Code Status: Full code  Family Communication: Wife at bedside.  All questions answered to her Satisfaction.  Disposition Plan: Admit to telemetry unit.  Consults called: Nephrology contacted by ED physician Dr. Melina Copa  Admission status: Inpatient.  Will require at least 2 midnights for further testings and treatment of present condition.    Kayleen Memos MD [facility] Pager [phone]  If 7PM-7AM, please contact night-coverage [email or web address] Password [email or web address]  11/02/2017, 1:19 PM

## 2017-11-02 NOTE — ED Triage Notes (Signed)
Patient sent to ED by his nephrologist for elevated creatinine - states he has chronic kidney disease and now needs dialysis. He reports generalized weakness, fatigue, nausea for a while. Denies pain. Resp e/u, skin warm/dry.

## 2017-11-03 ENCOUNTER — Inpatient Hospital Stay (HOSPITAL_COMMUNITY): Payer: BLUE CROSS/BLUE SHIELD

## 2017-11-03 ENCOUNTER — Encounter (HOSPITAL_COMMUNITY)
Admission: EM | Disposition: A | Discharge status: Home / Self Care | Payer: Self-pay | Source: Home / Self Care | Attending: Internal Medicine

## 2017-11-03 ENCOUNTER — Telehealth: Payer: Self-pay | Admitting: Vascular Surgery

## 2017-11-03 ENCOUNTER — Encounter (HOSPITAL_COMMUNITY): Payer: Self-pay | Admitting: *Deleted

## 2017-11-03 ENCOUNTER — Inpatient Hospital Stay (HOSPITAL_COMMUNITY): Payer: BLUE CROSS/BLUE SHIELD | Admitting: Anesthesiology

## 2017-11-03 HISTORY — PX: AV FISTULA PLACEMENT: SHX1204

## 2017-11-03 HISTORY — PX: INSERTION OF DIALYSIS CATHETER: SHX1324

## 2017-11-03 LAB — COMPREHENSIVE METABOLIC PANEL
ALT: 8 U/L — ABNORMAL LOW (ref 17–63)
AST: 6 U/L — ABNORMAL LOW (ref 15–41)
Albumin: 4 g/dL (ref 3.5–5.0)
Alkaline Phosphatase: 38 U/L (ref 38–126)
Anion gap: 23 — ABNORMAL HIGH (ref 5–15)
BUN: 153 mg/dL — ABNORMAL HIGH (ref 6–20)
CO2: 19 mmol/L — ABNORMAL LOW (ref 22–32)
Calcium: 9.2 mg/dL (ref 8.9–10.3)
Chloride: 96 mmol/L — ABNORMAL LOW (ref 101–111)
Creatinine, Ser: 23.66 mg/dL — ABNORMAL HIGH (ref 0.61–1.24)
Glucose, Bld: 85 mg/dL (ref 65–99)
Potassium: 3.7 mmol/L (ref 3.5–5.1)
Sodium: 138 mmol/L (ref 135–145)
Total Bilirubin: 1 mg/dL (ref 0.3–1.2)
Total Protein: 6.7 g/dL (ref 6.5–8.1)

## 2017-11-03 LAB — HEPATITIS B SURFACE ANTIGEN: Hepatitis B Surface Ag: NEGATIVE

## 2017-11-03 LAB — CBC
HCT: 30.8 % — ABNORMAL LOW (ref 39.0–52.0)
Hemoglobin: 9.6 g/dL — ABNORMAL LOW (ref 13.0–17.0)
MCH: 29.1 pg (ref 26.0–34.0)
MCHC: 31.2 g/dL (ref 30.0–36.0)
MCV: 93.3 fL (ref 78.0–100.0)
Platelets: 134 10*3/uL — ABNORMAL LOW (ref 150–400)
RBC: 3.3 MIL/uL — ABNORMAL LOW (ref 4.22–5.81)
RDW: 16.2 % — ABNORMAL HIGH (ref 11.5–15.5)
WBC: 5.3 10*3/uL (ref 4.0–10.5)

## 2017-11-03 LAB — GLUCOSE, CAPILLARY
Glucose-Capillary: 88 mg/dL (ref 65–99)
Glucose-Capillary: 87 mg/dL (ref 65–99)
Glucose-Capillary: 77 mg/dL (ref 65–99)

## 2017-11-03 LAB — ALT: ALT: 9 U/L — ABNORMAL LOW (ref 17–63)

## 2017-11-03 LAB — SURGICAL PCR SCREEN
MRSA, PCR: NEGATIVE
Staphylococcus aureus: POSITIVE — AB

## 2017-11-03 LAB — PROTIME-INR
INR: 1.16
Prothrombin Time: 14.7 seconds (ref 11.4–15.2)

## 2017-11-03 SURGERY — ARTERIOVENOUS (AV) FISTULA CREATION
Anesthesia: Monitor Anesthesia Care | Site: Chest

## 2017-11-03 MED ORDER — EPHEDRINE SULFATE 50 MG/ML IJ SOLN
INTRAMUSCULAR | Status: DC | PRN
  Administered 2017-11-03: 10 mg via INTRAVENOUS
  Administered 2017-11-03: 15 mg via INTRAVENOUS
  Administered 2017-11-03: 10 mg via INTRAVENOUS
  Administered 2017-11-03: 15 mg via INTRAVENOUS

## 2017-11-03 MED ORDER — FENTANYL CITRATE (PF) 250 MCG/5ML IJ SOLN
INTRAMUSCULAR | Status: AC
  Filled 2017-11-03: qty 5

## 2017-11-03 MED ORDER — ONDANSETRON HCL 4 MG/2ML IJ SOLN
INTRAMUSCULAR | Status: AC
  Filled 2017-11-03: qty 2

## 2017-11-03 MED ORDER — HEPARIN SODIUM (PORCINE) 1000 UNIT/ML DIALYSIS
1000.0000 [IU] | INTRAMUSCULAR | Status: DC | PRN
  Administered 2017-11-03: 1000 [IU] via INTRAVENOUS_CENTRAL

## 2017-11-03 MED ORDER — OXYCODONE HCL 5 MG PO TABS: 5.0000 mg | ORAL_TABLET | Freq: Once | ORAL | Status: DC | PRN

## 2017-11-03 MED ORDER — PENTAFLUOROPROP-TETRAFLUOROETH EX AERO: 1.0000 "application " | INHALATION_SPRAY | CUTANEOUS | Status: DC | PRN

## 2017-11-03 MED ORDER — HEPARIN SODIUM (PORCINE) 1000 UNIT/ML IJ SOLN
INTRAMUSCULAR | Status: AC
  Filled 2017-11-03: qty 1

## 2017-11-03 MED ORDER — SODIUM CHLORIDE 0.9 % IV SOLN: 100.0000 mL | INTRAVENOUS | Status: DC | PRN

## 2017-11-03 MED ORDER — MIDAZOLAM HCL 2 MG/2ML IJ SOLN
INTRAMUSCULAR | Status: DC | PRN
  Administered 2017-11-03: 2 mg via INTRAVENOUS

## 2017-11-03 MED ORDER — LIDOCAINE 2% (20 MG/ML) 5 ML SYRINGE
INTRAMUSCULAR | Status: AC
  Filled 2017-11-03: qty 5

## 2017-11-03 MED ORDER — LIDOCAINE HCL (PF) 1 % IJ SOLN: 5.0000 mL | INTRAMUSCULAR | Status: DC | PRN

## 2017-11-03 MED ORDER — CEFAZOLIN SODIUM-DEXTROSE 2-4 GM/100ML-% IV SOLN
INTRAVENOUS | Status: AC
  Filled 2017-11-03: qty 100

## 2017-11-03 MED ORDER — CEFAZOLIN SODIUM-DEXTROSE 2-3 GM-%(50ML) IV SOLR
INTRAVENOUS | Status: DC | PRN
  Administered 2017-11-03: 2 g via INTRAVENOUS

## 2017-11-03 MED ORDER — FENTANYL CITRATE (PF) 100 MCG/2ML IJ SOLN
INTRAMUSCULAR | Status: DC | PRN
  Administered 2017-11-03: 50 ug via INTRAVENOUS

## 2017-11-03 MED ORDER — OXYCODONE HCL 5 MG/5ML PO SOLN: 5.0000 mg | Freq: Once | ORAL | Status: DC | PRN

## 2017-11-03 MED ORDER — OXYCODONE-ACETAMINOPHEN 5-325 MG PO TABS
1.0000 | ORAL_TABLET | Freq: Four times a day (QID) | ORAL | Status: DC | PRN
  Administered 2017-11-03: 1 via ORAL
  Filled 2017-11-03 (×4): qty 1

## 2017-11-03 MED ORDER — LIDOCAINE HCL (CARDIAC) PF 100 MG/5ML IV SOSY
PREFILLED_SYRINGE | INTRAVENOUS | Status: DC | PRN
  Administered 2017-11-03: 80 mg via INTRAVENOUS

## 2017-11-03 MED ORDER — ONDANSETRON HCL 4 MG/2ML IJ SOLN
INTRAMUSCULAR | Status: DC | PRN
  Administered 2017-11-03: 4 mg via INTRAVENOUS

## 2017-11-03 MED ORDER — LIDOCAINE-EPINEPHRINE 0.5 %-1:200000 IJ SOLN
INTRAMUSCULAR | Status: DC | PRN
  Administered 2017-11-03: 50 mL

## 2017-11-03 MED ORDER — CHLORHEXIDINE GLUCONATE CLOTH 2 % EX PADS
6.0000 | MEDICATED_PAD | Freq: Every day | CUTANEOUS | Status: DC
  Administered 2017-11-03: 6 via TOPICAL

## 2017-11-03 MED ORDER — LIDOCAINE-EPINEPHRINE 0.5 %-1:200000 IJ SOLN
INTRAMUSCULAR | Status: AC
  Filled 2017-11-03: qty 1

## 2017-11-03 MED ORDER — SODIUM CHLORIDE 0.9 % IV SOLN: INTRAVENOUS | Status: DC

## 2017-11-03 MED ORDER — 0.9 % SODIUM CHLORIDE (POUR BTL) OPTIME
TOPICAL | Status: DC | PRN
  Administered 2017-11-03: 1000 mL

## 2017-11-03 MED ORDER — ACETAMINOPHEN 325 MG PO TABS: 325.0000 mg | ORAL_TABLET | ORAL | Status: DC | PRN

## 2017-11-03 MED ORDER — SODIUM CHLORIDE 0.9 % IV SOLN
INTRAVENOUS | Status: AC
  Filled 2017-11-03: qty 1.2

## 2017-11-03 MED ORDER — MIDAZOLAM HCL 2 MG/2ML IJ SOLN
INTRAMUSCULAR | Status: AC
  Filled 2017-11-03: qty 2

## 2017-11-03 MED ORDER — FENTANYL CITRATE (PF) 100 MCG/2ML IJ SOLN: 25.0000 ug | INTRAMUSCULAR | Status: DC | PRN

## 2017-11-03 MED ORDER — SODIUM CHLORIDE 0.9 % IV SOLN
INTRAVENOUS | Status: DC
  Administered 2017-11-03: 12:00:00 via INTRAVENOUS

## 2017-11-03 MED ORDER — LIDOCAINE-PRILOCAINE 2.5-2.5 % EX CREA: 1.0000 "application " | TOPICAL_CREAM | CUTANEOUS | Status: DC | PRN

## 2017-11-03 MED ORDER — MEPERIDINE HCL 50 MG/ML IJ SOLN: 6.2500 mg | INTRAMUSCULAR | Status: DC | PRN

## 2017-11-03 MED ORDER — ONDANSETRON HCL 4 MG/2ML IJ SOLN: 4.0000 mg | Freq: Once | INTRAMUSCULAR | Status: DC | PRN

## 2017-11-03 MED ORDER — HEPARIN SODIUM (PORCINE) 1000 UNIT/ML IJ SOLN
INTRAMUSCULAR | Status: DC | PRN
  Administered 2017-11-03: 1000 [IU]

## 2017-11-03 MED ORDER — HEPARIN SODIUM (PORCINE) 5000 UNIT/ML IJ SOLN
INTRAMUSCULAR | Status: DC | PRN
  Administered 2017-11-03: 12:00:00

## 2017-11-03 MED ORDER — PROPOFOL 500 MG/50ML IV EMUL
INTRAVENOUS | Status: DC | PRN
  Administered 2017-11-03: 100 ug/kg/min via INTRAVENOUS

## 2017-11-03 MED ORDER — ALTEPLASE 2 MG IJ SOLR: 2.0000 mg | Freq: Once | INTRAMUSCULAR | Status: DC | PRN

## 2017-11-03 MED ORDER — ACETAMINOPHEN 160 MG/5ML PO SOLN: 325.0000 mg | ORAL | Status: DC | PRN

## 2017-11-03 SURGICAL SUPPLY — 60 items
ADH SKN CLS APL DERMABOND .7 (GAUZE/BANDAGES/DRESSINGS) ×4
ARMBAND PINK RESTRICT EXTREMIT (MISCELLANEOUS) ×8 IMPLANT
BAG DECANTER FOR FLEXI CONT (MISCELLANEOUS) ×4 IMPLANT
BIOPATCH RED 1 DISK 7.0 (GAUZE/BANDAGES/DRESSINGS) ×3 IMPLANT
BIOPATCH RED 1IN DISK 7.0MM (GAUZE/BANDAGES/DRESSINGS) ×1
CANISTER SUCT 3000ML PPV (MISCELLANEOUS) ×4 IMPLANT
CANNULA VESSEL 3MM 2 BLNT TIP (CANNULA) ×4 IMPLANT
CATH PALINDROME RT-P 15FX23CM (CATHETERS) ×2 IMPLANT
CLIP LIGATING EXTRA MED SLVR (CLIP) ×4 IMPLANT
CLIP LIGATING EXTRA SM BLUE (MISCELLANEOUS) ×4 IMPLANT
COVER PROBE W GEL 5X96 (DRAPES) ×4 IMPLANT
COVER SURGICAL LIGHT HANDLE (MISCELLANEOUS) ×4 IMPLANT
DECANTER SPIKE VIAL GLASS SM (MISCELLANEOUS) ×4 IMPLANT
DERMABOND ADVANCED (GAUZE/BANDAGES/DRESSINGS) ×4
DERMABOND ADVANCED .7 DNX12 (GAUZE/BANDAGES/DRESSINGS) ×2 IMPLANT
DRAPE C-ARM 42X72 X-RAY (DRAPES) ×4 IMPLANT
DRAPE CHEST BREAST 15X10 FENES (DRAPES) ×4 IMPLANT
ELECT REM PT RETURN 9FT ADLT (ELECTROSURGICAL) ×4
ELECTRODE REM PT RTRN 9FT ADLT (ELECTROSURGICAL) ×2 IMPLANT
GAUZE SPONGE 4X4 16PLY XRAY LF (GAUZE/BANDAGES/DRESSINGS) ×4 IMPLANT
GLOVE BIO SURGEON STRL SZ 6.5 (GLOVE) ×4 IMPLANT
GLOVE BIO SURGEONS STRL SZ 6.5 (GLOVE) ×4
GLOVE BIOGEL PI IND STRL 6.5 (GLOVE) IMPLANT
GLOVE BIOGEL PI IND STRL 7.0 (GLOVE) IMPLANT
GLOVE BIOGEL PI IND STRL 8.5 (GLOVE) IMPLANT
GLOVE BIOGEL PI INDICATOR 6.5 (GLOVE) ×4
GLOVE BIOGEL PI INDICATOR 7.0 (GLOVE) ×6
GLOVE BIOGEL PI INDICATOR 8.5 (GLOVE) ×2
GLOVE SS BIOGEL STRL SZ 7.5 (GLOVE) ×2 IMPLANT
GLOVE SUPERSENSE BIOGEL SZ 7.5 (GLOVE) ×4
GOWN STRL NON-REIN LRG LVL3 (GOWN DISPOSABLE) ×2 IMPLANT
GOWN STRL REUS W/ TWL LRG LVL3 (GOWN DISPOSABLE) ×6 IMPLANT
GOWN STRL REUS W/TWL LRG LVL3 (GOWN DISPOSABLE) ×24
KIT BASIN OR (CUSTOM PROCEDURE TRAY) ×4 IMPLANT
KIT TURNOVER KIT B (KITS) ×4 IMPLANT
NDL 18GX1X1/2 (RX/OR ONLY) (NEEDLE) ×2 IMPLANT
NDL HYPO 25GX1X1/2 BEV (NEEDLE) ×2 IMPLANT
NEEDLE 18GX1X1/2 (RX/OR ONLY) (NEEDLE) ×4 IMPLANT
NEEDLE 22X1 1/2 (OR ONLY) (NEEDLE) IMPLANT
NEEDLE HYPO 25GX1X1/2 BEV (NEEDLE) ×4 IMPLANT
NS IRRIG 1000ML POUR BTL (IV SOLUTION) ×4 IMPLANT
PACK CV ACCESS (CUSTOM PROCEDURE TRAY) ×4 IMPLANT
PACK SURGICAL SETUP 50X90 (CUSTOM PROCEDURE TRAY) ×4 IMPLANT
PAD ARMBOARD 7.5X6 YLW CONV (MISCELLANEOUS) ×8 IMPLANT
SOAP 2 % CHG 4 OZ (WOUND CARE) ×4 IMPLANT
SUT ETHILON 3 0 PS 1 (SUTURE) ×4 IMPLANT
SUT PROLENE 6 0 CC (SUTURE) ×6 IMPLANT
SUT SILK 3 0 (SUTURE) ×4
SUT SILK 3-0 18XBRD TIE 12 (SUTURE) IMPLANT
SUT VIC AB 3-0 SH 27 (SUTURE) ×4
SUT VIC AB 3-0 SH 27X BRD (SUTURE) ×2 IMPLANT
SUT VICRYL 4-0 PS2 18IN ABS (SUTURE) ×6 IMPLANT
SYR 10ML LL (SYRINGE) ×4 IMPLANT
SYR 20CC LL (SYRINGE) ×4 IMPLANT
SYR 5ML LL (SYRINGE) ×8 IMPLANT
SYR CONTROL 10ML LL (SYRINGE) ×4 IMPLANT
TOWEL GREEN STERILE (TOWEL DISPOSABLE) ×8 IMPLANT
TOWEL GREEN STERILE FF (TOWEL DISPOSABLE) ×4 IMPLANT
UNDERPAD 30X30 (UNDERPADS AND DIAPERS) ×4 IMPLANT
WATER STERILE IRR 1000ML POUR (IV SOLUTION) ×4 IMPLANT

## 2017-11-03 NOTE — Anesthesia Preprocedure Evaluation (Signed)
Anesthesia Evaluation  Patient identified by MRN, date of birth, ID band Patient awake    Reviewed: Allergy & Precautions, H&P , NPO status , Patient's Chart, lab work & pertinent test results, reviewed documented beta blocker date and time   Airway Mallampati: II  TM Distance: >3 FB Neck ROM: full    Dental no notable dental hx.    Pulmonary asthma , sleep apnea , former smoker,    Pulmonary exam normal breath sounds clear to auscultation       Cardiovascular Exercise Tolerance: Good hypertension, negative cardio ROS   Rhythm:regular Rate:Normal  Echo 2/15 Study Conclusions  - Left ventricle: The cavity size was normal. There was moderate concentric hypertrophy. Systolic function was vigorous. The estimated ejection fraction was in the range of 65% to 70%. Wall motion was normal; there were no regional wall motion abnormalities. Features are consistent with a pseudonormal left ventricular filling pattern, with concomitant abnormal relaxation and increased filling pressure (grade 2 diastolic dysfunction). - Left atrium: The atrium was mildly dilated. Impressions:  EKG 6/19 Sinus rhythm Incomplete left bundle branch block Low voltage, precordial leads ST elev, probable normal early repol pattern Baseline wander in lead(s) V4 V5 V6 similar to prior 2/18   Neuro/Psych negative psych ROS   GI/Hepatic negative GI ROS, Neg liver ROS,   Endo/Other  negative endocrine ROSdiabetes  Renal/GU negative Renal ROS  negative genitourinary   Musculoskeletal   Abdominal   Peds  Hematology negative hematology ROS (+)   Anesthesia Other Findings   Reproductive/Obstetrics negative OB ROS                             Anesthesia Physical Anesthesia Plan  ASA: III  Anesthesia Plan: MAC   Post-op Pain Management:    Induction: Intravenous  PONV Risk Score and Plan: 1 and  Treatment may vary due to age or medical condition  Airway Management Planned: Mask, Natural Airway and Nasal Cannula  Additional Equipment:   Intra-op Plan:   Post-operative Plan:   Informed Consent: I have reviewed the patients History and Physical, chart, labs and discussed the procedure including the risks, benefits and alternatives for the proposed anesthesia with the patient or authorized representative who has indicated his/her understanding and acceptance.   Dental Advisory Given  Plan Discussed with: CRNA, Anesthesiologist and Surgeon  Anesthesia Plan Comments:         Anesthesia Quick Evaluation

## 2017-11-03 NOTE — Discharge Instructions (Signed)
° °  [facility]  Discharge Instructions  AV Fistula or Graft Surgery for Dialysis Access  Please refer to the following instructions for your post-procedure care. Your surgeon or physician assistant will discuss any changes with you.  Activity  You may drive the day following your surgery, if you are comfortable and no longer taking prescription pain medication. Resume full activity as the soreness in your incision resolves.  Bathing/Showering  You may shower after you go home. Keep your incision dry for 48 hours. Do not soak in a bathtub, hot tub, or swim until the incision heals completely. You may not shower if you have a hemodialysis catheter.  Incision Care  Clean your incision with mild soap and water after 48 hours. Pat the area dry with a clean towel. You do not need a bandage unless otherwise instructed. Do not apply any ointments or creams to your incision. You may have skin glue on your incision. Do not peel it off. It will come off on its own in about one week. Your arm may swell a bit after surgery. To reduce swelling use pillows to elevate your arm so it is above your heart. Your doctor will tell you if you need to lightly wrap your arm with an ACE bandage.  Diet  Resume your normal diet. There are not special food restrictions following this procedure. In order to heal from your surgery, it is CRITICAL to get adequate nutrition. Your body requires vitamins, minerals, and protein. Vegetables are the best source of vitamins and minerals. Vegetables also provide the perfect balance of protein. Processed food has little nutritional value, so try to avoid this.  Medications  Resume taking all of your medications. If your incision is causing pain, you may take over-the counter pain relievers such as acetaminophen (Tylenol). If you were prescribed a stronger pain medication, please be aware these medications can cause nausea and constipation. Prevent  nausea by taking the medication with a snack or meal. Avoid constipation by drinking plenty of fluids and eating foods with high amount of fiber, such as fruits, vegetables, and grains.  Do not take Tylenol if you are taking prescription pain medications.  Follow up Your surgeon may want to see you in the office following your access surgery. If so, this will be arranged at the time of your surgery.  Please call us immediately for any of the following conditions:  Increased pain, redness, drainage (pus) from your incision site Fever of 101 degrees or higher Severe or worsening pain at your incision site Hand pain or numbness.  Reduce your risk of vascular disease:  Stop smoking. If you would like help, call [facility] at 1-800-QUIT-NOW 405-842-7755) or [place] at [place] your cholesterol Maintain a desired weight Control your diabetes Keep your blood pressure down  Dialysis  It will take several weeks to several months for your new dialysis access to be ready for use. Your surgeon will determine when it is okay to use it. Your nephrologist will continue to direct your dialysis. You can continue to use your Permcath until your new access is ready for use.   11/03/2017 ESGAR BARNICK [id number] 20-Dec-1970  Surgeon(s): Early, Arvilla Meres, MD  Procedure(s): ARTERIOVENOUS (AV) FISTULA CREATION  left upper arm INSERTION OF DIALYSIS CATHETER - RIGHT INTERNAL JUGULAR PLACEMENT  x Do not stick fistula for 12 weeks    If you have any questions, please call the office at [phone].

## 2017-11-03 NOTE — Op Note (Signed)
    OPERATIVE REPORT  DATE OF SURGERY: 11/03/2017  PATIENT: Adrian Frye, 47 y.o. male MRN: [id number]  DOB: [birth year]  PRE-OPERATIVE DIAGNOSIS: End-stage renal disease  POST-OPERATIVE DIAGNOSIS:  Same  PROCEDURE: #1 right IJ hemodialysis tunneled catheter with SonoSite visualization, #2 left first stage brachiobasilic fistula  SURGEON:  Curt Jews, M.D.  PHYSICIAN ASSISTANT: Liana Crocker, PA-C  ANESTHESIA: Local with sedation  EBL: Minimal ml  Total I/O In: 400 [I.V.:400] Out: 20 [Blood:20]  BLOOD ADMINISTERED: None  DRAINS: None  SPECIMEN: None  COUNTS CORRECT:  YES  PLAN OF CARE: PACU  PATIENT DISPOSITION:  PACU - hemodynamically stable  PROCEDURE DETAILS: The patient was taken to the operating placed supine position where the area of the right and left neck were imaged with ultrasound revealing widely patent jugular veins bilaterally.  The right and left neck and chest were prepped in usual sterile fashion.  Using SonoSite visualization the right internal jugular vein was accessed with an 18-gauge needle and the base of the neck.  A guidewire was passed centrally and this was confirmed with fluoroscopy.  A dilator and peel-away sheath was passed over the guidewire and the dilator and guidewire removed.  A 23 cm tunnel catheter was passed through the peel-away sheath which was removed.  The catheter tips were positioned at the level of the distal right atrium.  The catheter was brought through a subcutaneous tunnel and the 2 lm ports were attached.  Both lumens flushed and aspirated easily and were locked with 1000/cc heparin.  The catheter was secured to the skin with a 3-0 nylon stitch and the entry site was closed with a 4 sub-particular Vicryl stitch.  Next the left arm was prepped and draped in usual sterile fashion.  Again SonoSite ultrasound was used to visualize the arm which revealed a good caliber basilic vein.  There was an adequate cephalic vein.  Incision  was made longitudinally over the basilic vein and tributary branches were ligated with 3-0 and 4 silk ties and divided.  The vein was ligated distally and was gently dilated and was a very good caliber.  The brachial artery was exposed to the same incision by creating a tunnel more medial medially.  The artery was large.  The artery was occluded proximally distally it was opened with an 11 blade and extended longitudinally with Potts scissors.  The vein was cut to the appropriate length and was spatulated and sewn end-to-side to the artery with a running 6-0 Prolene suture clamps removed and excellent thrill was noted.  The patient maintained a 2+ radial pulse.  The wounds irrigated with saline.  Hemostasis obtained electrocautery.  Wound was closed with 3-0 Vicryl in the subcutaneous and subcuticular tissue.  Sterile dressing was applied patient was transferred to the recovery room in where chest x-rays pending   Rosetta Posner, M.D., [facility] 11/03/2017 2:14 PM

## 2017-11-03 NOTE — Telephone Encounter (Signed)
sch appt lvm 12/05/17 8am Dialysis Duplex 845am p/o MD

## 2017-11-03 NOTE — Transfer of Care (Signed)
Immediate Anesthesia Transfer of Care Note  Patient: Adrian Frye  Procedure(s) Performed: ARTERIOVENOUS (AV) FISTULA CREATION  left upper arm (Left Arm Upper) INSERTION OF DIALYSIS CATHETER - RIGHT INTERNAL JUGULAR PLACEMENT (N/A Chest)  Patient Location: PACU  Anesthesia Type:MAC  Level of Consciousness: awake, alert  and oriented  Airway & Oxygen Therapy: Patient Spontanous Breathing  Post-op Assessment: Report given to RN  Post vital signs: Reviewed and stable  Last Vitals:  Vitals Value Taken Time  BP    Temp    Pulse    Resp    SpO2      Last Pain:  Vitals:   11/03/17 0446  TempSrc: Oral  PainSc:          Complications: No apparent anesthesia complications

## 2017-11-03 NOTE — Progress Notes (Signed)
  [facility] Progress Note   Assessment/ Plan:   1. End stage renal disease/CKD stage 5- he is now with uremic symptoms and is amenable to have vascular access placement and initiate HD.  Dr. Marval Regal also discussed other modalities of RRT including incenter HD, home HD, peritoneal dialysis as well as kidney transplantation.  for [facility] and AVF today then HD #1. 2. Anemia of CKD stage 5- will start ESA therapy with HD and follow iron stores 3. Secondary HPTH- iPTH was 163 on 08/18/17, phos 7.6, ca 9 will start binders and vit D 4. HTN- continue outpatient meds and UF with HD 5. DM- per primary 6. Vascular access- as above 7. Disposition- will start the CLIP process and continue educating about RRT options.  He still works and may be interested in home therapies   Subjective:    For [facility] and perm access today, then HD #1.     Objective:   BP 126/77 (BP Location: Left Arm)   Pulse 64   Temp 98 F (36.7 C) (Oral)   Resp 17   Ht 5\' 7"  (1.702 m)   Wt 123.3 kg (271 lb 14.4 oz)   SpO2 98%   BMI 42.59 kg/m   Physical Exam: Gen: NAD, sitting in bed CVS: RRR no rub Resp: clear bilaterally HFG:BMSXJD distended Ext: no LE edema  Labs: BMET Recent Labs  Lab 11/02/17 1022 11/03/17 0633  NA 139 138  K 3.8 3.7  CL 96* 96*  CO2 20* 19*  GLUCOSE 88 85  BUN 143* 153*  CREATININE 24.42* 23.66*  CALCIUM 9.6 9.2  PHOS 11.6*  --    CBC Recent Labs  Lab 11/02/17 1022 11/03/17 0633  WBC 5.2 5.3  HGB 9.9* 9.6*  HCT 31.9* 30.8*  MCV 94.4 93.3  PLT 172 134*    @IMGRELPRIORS @ Medications:    . carvedilol  25 mg Oral BID  . Chlorhexidine Gluconate Cloth  6 each Topical Q0600  . heparin injection (subcutaneous)  5,000 Units Subcutaneous Q8H  . hydrALAZINE  100 mg Oral TID  . insulin aspart  0-15 Units Subcutaneous TID WC  . insulin aspart  0-5 Units Subcutaneous QHS  . lanthanum  1,000 mg Oral TID WC  . NIFEdipine  30 mg Oral Daily  . simvastatin  20 mg Oral  QHS     Madelon Lips, MD 11/03/2017, 10:06 AM

## 2017-11-03 NOTE — Progress Notes (Signed)
HD tx completed @ 2243 w/cath function issues requiring lines to be reversed on a brand new HD cath placed earlier today, UF goal met, blood rinsed back, VSS, report called to Wende Bushy, RN

## 2017-11-03 NOTE — Interval H&P Note (Signed)
History and Physical Interval Note:  11/03/2017 11:03 AM  Adrian Frye  has presented today for surgery, with the diagnosis of end stage kidney disease  The various methods of treatment have been discussed with the patient and family. After consideration of risks, benefits and other options for treatment, the patient has consented to  Procedure(s): ARTERIOVENOUS (AV) FISTULA CREATION VERSUS GRAFT (Left) INSERTION OF DIALYSIS CATHETER (N/A) as a surgical intervention .  The patient's history has been reviewed, patient examined, no change in status, stable for surgery.  I have reviewed the patient's chart and labs.  Questions were answered to the patient's satisfaction.     Curt Jews

## 2017-11-03 NOTE — Progress Notes (Signed)
PROGRESS NOTE    Adrian Frye  JHE:[id number] DOB: May 27, 1970 DOA: 11/02/2017 PCP: Seward Carol, MD    Brief Narrative:  47 y.o. male with medical history significant for CKD 5, hypertension, chronic diastolic CHF, OSA, anemia of chronic disease, type 2 diabetes, asthma, morbid obesity, who presented to ED [facility] as recommended by his primary care provider due to worsening creatinine and worsening generalized weakness and poor appetite.  Onset of symptoms 7 days ago and have been gradually worsening.  Went to his PCP yesterday 11/01/17 labs were drawn. Results came back last night for which his primary care provider recommended him to come to the ED due to abnormal labs.  Denies lower extremity edema, chest pain, orthopnea, or dyspnea.  ED Course: On presentation to the ED, VS revealed accelerated hypertension.  Lab studies remarkable for BUN 143, creatinine of 24, and GFR of 2.  Baseline creatinine 7 a year ago.  Reports he still makes urine.  Been declining dialysis for the past year, however today he is agreeable if necessary.  Assessment & Plan:   Active Problems:   Acute prerenal azotemia  Acute prerenal azotemia/AKI on CKD 5 -BUN 124 creatinine of 24 with baseline creatinine of 7 a year ago -Being followed outpatient by nephrologist Dr. Quincy Simmonds at [facility] -Previously declined hemodialysis.  Patient now agreeable for dialysis -Nephrology consulted and is following. -Vascular surgery was consulted, patient is now status post placement of temporary dialysis catheter and AV fistula placement. -Per nephrology, plan for dialysis #1 today.  CKD 5 -Family history of chronic kidney disease with mother who was previously on dialysis -Plan per above  Type 2 diabetes -previous A1c 7.1 on on 09/16/2009  -repeat a1c of 4.7 -random glucose stable -Patient requests no further finger sticks. Will hold off and instead follow random glucose per renal panel  Anemia of chronic disease due  to advanced CKD -Hemoglobin stable at 9 -No sign of overt bleeding at this time -CBC reviewed. Stable  Accelerated hypertension -Continue home medications as tolerated -Currently on Coreg 25 mg twice daily, Hydralazine 100 mg 3 times daily, nifedipine 30 mg daily -BP currently stable  Gout -Held colchicine 0.6 mg tablet due poor renal function -Continue allopurinol as tolerated  Hyperlipidemia -Continue Zocor as tolerated  Morbid obesity -Recommend weight loss outpatient -Consider PT eval when more stable  OSA CPAP at night  DVT prophylaxis: heparin subq Code Status: full Family Communication: pt in room Disposition Plan: uncertain at this time  Consultants:   Nephrology  Vascular surgery  Procedures:   Temporary dialysis catheter as well as AV fistula placement by vascular surgery on 11/03/2017  Antimicrobials: Anti-infectives (From admission, onward)   Start     Dose/Rate Route Frequency Ordered Stop   11/03/17 1107  ceFAZolin (ANCEF) 2-4 GM/100ML-% IVPB    Note to Pharmacy:  Lisette Grinder   : cabinet override      11/03/17 1107 11/03/17 2314   11/03/17 0600  cefUROXime (ZINACEF) 1.5 g in sodium chloride 0.9 % 100 mL IVPB     1.5 g 200 mL/hr over 30 Minutes Intravenous To ShortStay Surgical 11/02/17 1416 11/04/17 0600       Subjective: Without complaints at this time  Objective: Vitals:   11/03/17 1400 11/03/17 1415 11/03/17 1430 11/03/17 1445  BP: 122/62 118/69 137/70 140/74  Pulse: 68 60 60 60  Resp: 18 20 14 17   Temp: (!) 97.5 F (36.4 C)  97.6 F (36.4 C) (!) 97.5 F (36.4 C)  TempSrc:      SpO2: 93% 92% 93% 93%  Weight:      Height:        Intake/Output Summary (Last 24 hours) at 11/03/2017 1505 Last data filed at 11/03/2017 1400 Gross per 24 hour  Intake 450 ml  Output 270 ml  Net 180 ml   Filed Weights   11/02/17 1759 11/02/17 2113 11/03/17 1104  Weight: 125.5 kg (276 lb 10.8 oz) 123.3 kg (271 lb 14.4 oz) 122.9 kg (271 lb)      Examination:  General exam: Appears calm and comfortable  Respiratory system: Clear to auscultation. Respiratory effort normal. Cardiovascular system: S1 & S2 heard, RRR. Gastrointestinal system: Abdomen is nondistended, soft and nontender. No organomegaly or masses felt. Normal bowel sounds heard. Central nervous system: Alert and oriented. No focal neurological deficits. Extremities: Symmetric 5 x 5 power. Skin: No rashes, lesions  Psychiatry: Judgement and insight appear normal. Mood & affect appropriate.   Data Reviewed: I have personally reviewed following labs and imaging studies  CBC: Recent Labs  Lab 11/02/17 1022 11/03/17 0633  WBC 5.2 5.3  HGB 9.9* 9.6*  HCT 31.9* 30.8*  MCV 94.4 93.3  PLT 172 267*   Basic Metabolic Panel: Recent Labs  Lab 11/02/17 1022 11/03/17 0633  NA 139 138  K 3.8 3.7  CL 96* 96*  CO2 20* 19*  GLUCOSE 88 85  BUN 143* 153*  CREATININE 24.42* 23.66*  CALCIUM 9.6 9.2  MG 3.3*  --   PHOS 11.6*  --    GFR: Estimated Creatinine Clearance: 4.8 mL/min (A) (by C-G formula based on SCr of 23.66 mg/dL (H)). Liver Function Tests: Recent Labs  Lab 11/02/17 1022 11/03/17 0633  AST 9* 6*  ALT 11* 8*  ALKPHOS 41 38  BILITOT 1.1 1.0  PROT 7.2 6.7  ALBUMIN 4.3 4.0   No results for input(s): LIPASE, AMYLASE in the last 168 hours. No results for input(s): AMMONIA in the last 168 hours. Coagulation Profile: Recent Labs  Lab 11/02/17 1200 11/03/17 0633  INR 1.26 1.16   Cardiac Enzymes: No results for input(s): CKTOTAL, CKMB, CKMBINDEX, TROPONINI in the last 168 hours. BNP (last 3 results) No results for input(s): PROBNP in the last 8760 hours. HbA1C: Recent Labs    11/02/17 1022  HGBA1C 4.7*   CBG: Recent Labs  Lab 11/02/17 1221 11/02/17 1710 11/03/17 1136 11/03/17 1401  GLUCAP 88 91 88 87   Lipid Profile: No results for input(s): CHOL, HDL, LDLCALC, TRIG, CHOLHDL, LDLDIRECT in the last 72 hours. Thyroid Function  Tests: No results for input(s): TSH, T4TOTAL, FREET4, T3FREE, THYROIDAB in the last 72 hours. Anemia Panel: No results for input(s): VITAMINB12, FOLATE, FERRITIN, TIBC, IRON, RETICCTPCT in the last 72 hours. Sepsis Labs: No results for input(s): PROCALCITON, LATICACIDVEN in the last 168 hours.  Recent Results (from the past 240 hour(s))  Surgical pcr screen     Status: Abnormal   Collection Time: 11/03/17  7:27 AM  Result Value Ref Range Status   MRSA, PCR NEGATIVE NEGATIVE Final   Staphylococcus aureus POSITIVE (A) NEGATIVE Final    Comment: (NOTE) The Xpert SA Assay (FDA approved for NASAL specimens in patients 80 years of age and older), is one component of a comprehensive surveillance program. It is not intended to diagnose infection nor to guide or monitor treatment. Performed at [facility], [place] [address] [place], [place] [address]      Radiology Studies: Dg Chest [place] [address]  Result Date: 11/03/2017 CLINICAL DATA:  Status post dialysis catheter insertion. EXAM: PORTABLE CHEST 1 VIEW COMPARISON:  07/01/2016 FINDINGS: A right jugular dialysis catheter has been placed and terminates over the lower SVC. The cardiac silhouette is mildly enlarged. There is central pulmonary vascular congestion without overt edema. Streaky opacities are present in both lung bases, and there is a small left pleural effusion. No pneumothorax is identified. No acute osseous abnormality is seen. IMPRESSION: 1. Right jugular dialysis catheter as above.  No pneumothorax. 2. Mild pulmonary vascular congestion. 3. Bibasilar atelectasis or pneumonia.  Small left pleural effusion. Electronically Signed   By: Logan Bores M.D.   On: 11/03/2017 14:50   Dg Fluoro Guide Cv Line-no Report  Result Date: 11/03/2017 Fluoroscopy was utilized by the requesting physician.  No radiographic interpretation.    Scheduled Meds: . carvedilol  25 mg Oral BID  . Chlorhexidine Gluconate Cloth  6 each Topical Q0600  .  heparin injection (subcutaneous)  5,000 Units Subcutaneous Q8H  . hydrALAZINE  100 mg Oral TID  . insulin aspart  0-15 Units Subcutaneous TID WC  . insulin aspart  0-5 Units Subcutaneous QHS  . lanthanum  1,000 mg Oral TID WC  . NIFEdipine  30 mg Oral Daily  . simvastatin  20 mg Oral QHS   Continuous Infusions: . sodium chloride    . sodium chloride    . sodium chloride    . sodium chloride    . ceFAZolin    . cefUROXime (ZINACEF)  IV       LOS: 1 day   Marylu Lund, MD [facility] Pager [phone]  If 7PM-7AM, please contact night-coverage [email or web address] Password [facility] 11/03/2017, 3:05 PM

## 2017-11-03 NOTE — Anesthesia Procedure Notes (Signed)
Procedure Name: MAC Date/Time: 11/03/2017 11:57 AM Performed by: Barrington Ellison, CRNA Pre-anesthesia Checklist: Patient identified, Emergency Drugs available, Suction available, Patient being monitored and Timeout performed Patient Re-evaluated:Patient Re-evaluated prior to induction Oxygen Delivery Method: Simple face mask

## 2017-11-03 NOTE — Progress Notes (Signed)
HD tx initiated via HD cath w/ cath function issues. AP: very sluggish pull, like it skips pulls, feels like it needs to be pulled back some, push/flush ok, VP: pull/push/flush well, lines reversed for tx, VSS, will cont to monitor while on HD tx

## 2017-11-04 DIAGNOSIS — Z419 Encounter for procedure for purposes other than remedying health state, unspecified: Secondary | ICD-10-CM

## 2017-11-04 DIAGNOSIS — Z992 Dependence on renal dialysis: Secondary | ICD-10-CM

## 2017-11-04 DIAGNOSIS — Z95828 Presence of other vascular implants and grafts: Secondary | ICD-10-CM

## 2017-11-04 MED ORDER — CHLORHEXIDINE GLUCONATE CLOTH 2 % EX PADS
6.0000 | MEDICATED_PAD | Freq: Every day | CUTANEOUS | Status: DC
  Administered 2017-11-04 – 2017-11-06 (×3): 6 via TOPICAL

## 2017-11-04 MED ORDER — POLYETHYLENE GLYCOL 3350 17 G PO PACK: 17.0000 g | PACK | Freq: Every day | ORAL | Status: DC | PRN

## 2017-11-04 MED ORDER — BISACODYL 10 MG RE SUPP: 10.0000 mg | Freq: Every day | RECTAL | Status: DC | PRN

## 2017-11-04 MED ORDER — MUPIROCIN 2 % EX OINT
1.0000 "application " | TOPICAL_OINTMENT | Freq: Two times a day (BID) | CUTANEOUS | Status: DC
  Administered 2017-11-04 – 2017-11-07 (×8): 1 via NASAL
  Filled 2017-11-04 (×2): qty 22

## 2017-11-04 MED ORDER — CHLORHEXIDINE GLUCONATE CLOTH 2 % EX PADS
6.0000 | MEDICATED_PAD | Freq: Every day | CUTANEOUS | Status: DC
  Administered 2017-11-05 – 2017-11-08 (×4): 6 via TOPICAL

## 2017-11-04 MED ORDER — LACTULOSE 10 GM/15ML PO SOLN
10.0000 g | Freq: Every day | ORAL | Status: DC | PRN
  Administered 2017-11-04: 10 g via ORAL
  Filled 2017-11-04: qty 15

## 2017-11-04 MED ORDER — POLYETHYLENE GLYCOL 3350 17 G PO PACK
17.0000 g | PACK | Freq: Two times a day (BID) | ORAL | Status: DC
  Administered 2017-11-04 – 2017-11-06 (×2): 17 g via ORAL
  Filled 2017-11-04 (×8): qty 1

## 2017-11-04 NOTE — Progress Notes (Signed)
Nutrition Education Note  RD consulted for Renal Education. Provided handouts to patient/family along with written recommended serving sizes specifically determined for patient's current nutritional status.   Pt states, "I didn't sleep well last night. Please come back later." RD reiterated importance of diet education for ESRD on HD. Pt states, "I don't think I can listen right now." Pt requested RD leave handouts.  RD to follow up at a later date as schedule allows.  Body mass index is 42.3 kg/m. Pt meets criteria for Obesity Class III based on current BMI.  Current diet order is Renal with 1200 ml fluid restriction. Labs and medications reviewed.  Gaynell Face, MS, RD, LDN Pager: [phone] Weekend/After Hours: [phone]

## 2017-11-04 NOTE — Progress Notes (Signed)
  [facility] Progress Note   Assessment/ Plan:   1. End stage renal disease/CKD stage 5- he is now with uremic symptoms and is amenable to have vascular access placement and initiate HD.  Dr. Marval Regal also discussed other modalities of RRT including incenter HD, home HD, peritoneal dialysis as well as kidney transplantation.  s/p TDC and AVF 6/21, HD #1 6/21, HD #2 today 6/22. 2. Anemia of CKD stage 5- will start ESA therapy with HD and follow iron stores 3. Secondary HPTH- iPTH was 163 on 08/18/17, phos 7.6, ca 9 will start binders and vit D 4. HTN- continue outpatient meds and UF with HD 5. DM- per primary 6. Vascular access- as above 7. Disposition- CLIP started   Subjective:    For HD #2 today.     Objective:   BP (!) 146/80 (BP Location: Right Arm)   Pulse 68   Temp 98.2 F (36.8 C) (Oral)   Resp 18   Ht 5\' 7"  (1.702 m)   Wt 122.5 kg (270 lb 1 oz)   SpO2 93%   BMI 42.30 kg/m   Physical Exam: Gen: NAD, sitting in bed CVS: RRR no rub Resp: clear bilaterally DBZ:MCEYEM distended Ext: no LE edema ACCESS: R IJ TDC dressing c/d/i, LUE AVF + T/B  Labs: BMET Recent Labs  Lab 11/02/17 1022 11/03/17 0633  NA 139 138  K 3.8 3.7  CL 96* 96*  CO2 20* 19*  GLUCOSE 88 85  BUN 143* 153*  CREATININE 24.42* 23.66*  CALCIUM 9.6 9.2  PHOS 11.6*  --    CBC Recent Labs  Lab 11/02/17 1022 11/03/17 0633  WBC 5.2 5.3  HGB 9.9* 9.6*  HCT 31.9* 30.8*  MCV 94.4 93.3  PLT 172 134*    @IMGRELPRIORS @ Medications:    . carvedilol  25 mg Oral BID  . Chlorhexidine Gluconate Cloth  6 each Topical Daily  . heparin injection (subcutaneous)  5,000 Units Subcutaneous Q8H  . hydrALAZINE  100 mg Oral TID  . lanthanum  1,000 mg Oral TID WC  . mupirocin ointment  1 application Nasal BID  . NIFEdipine  30 mg Oral Daily  . polyethylene glycol  17 g Oral BID  . simvastatin  20 mg Oral QHS     Madelon Lips, MD 11/04/2017, 12:55 PM

## 2017-11-04 NOTE — Progress Notes (Signed)
Patient ID: Adrian Frye, male   DOB: Apr 30, 1971, 47 y.o.   MRN: [id number] Reports some soreness in his left antecubital incision.  Right IJ catheter out hematoma. Left arm without hematoma.  Excellent thrill in his fistula.  We will see him in the office in 4 to 6 weeks to repeat duplex and confirm that he has had adequate maturation for second stage transposition.  Again discussed this plan in detail with the patient who understands.  Will not follow actively while in the hospital.  Please call if we can assist

## 2017-11-04 NOTE — Progress Notes (Signed)
PROGRESS NOTE    CLOYD RAGAS  URK:[id number] DOB: 01/19/1971 DOA: 11/02/2017 PCP: Seward Carol, MD    Brief Narrative:  47 y.o. male with medical history significant for CKD 5, hypertension, chronic diastolic CHF, OSA, anemia of chronic disease, type 2 diabetes, asthma, morbid obesity, who presented to ED [facility] as recommended by his primary care provider due to worsening creatinine and worsening generalized weakness and poor appetite.  Onset of symptoms 7 days ago and have been gradually worsening.  Went to his PCP yesterday 11/01/17 labs were drawn. Results came back last night for which his primary care provider recommended him to come to the ED due to abnormal labs.  Denies lower extremity edema, chest pain, orthopnea, or dyspnea.  ED Course: On presentation to the ED, VS revealed accelerated hypertension.  Lab studies remarkable for BUN 143, creatinine of 24, and GFR of 2.  Baseline creatinine 7 a year ago.  Reports he still makes urine.  Been declining dialysis for the past year, however today he is agreeable if necessary.  Assessment & Plan:   Active Problems:   Acute prerenal azotemia  Acute prerenal azotemia/AKI on CKD 5 -BUN 124 creatinine of 24 with baseline creatinine of 7 a year ago -Being followed outpatient by nephrologist Dr. Quincy Simmonds at [facility] -Previously declined hemodialysis.  Patient now agreeable for dialysis -Nephrology consulted and is following. -Vascular surgery was consulted, patient is now status post placement of temporary dialysis catheter and AV fistula placement. -Continue with dialysis per Nephrology  CKD 5 -Family history of chronic kidney disease with mother who was previously on dialysis -Now on HD -Plan as per above  Type 2 diabetes -previous A1c 7.1 on on 09/16/2009  -repeat a1c of 4.7 -random glucose stable -Patient requests no further finger sticks. Will hold off and instead follow random glucose per renal panel -Labs reviewed. Glucose  stable  Anemia of chronic disease due to advanced CKD -Hemoglobin stable at 9 -No sign of overt bleeding at this time -CBC had remained stable  Accelerated hypertension -Continue home medications as tolerated -Currently on Coreg 25 mg twice daily, Hydralazine 100 mg 3 times daily, nifedipine 30 mg daily -BP currently stable  Gout -Held colchicine 0.6 mg tablet due poor renal function -will continue allopurinol as tolerated -Ambulating  Hyperlipidemia -Continue Zocor as patient tolerates  Morbid obesity -Recommend weight loss outpatient -Patient ambulating  OSA CPAP at night  DVT prophylaxis: heparin subq Code Status: full Family Communication: pt in room Disposition Plan: uncertain at this time  Consultants:   Nephrology  Vascular surgery  Procedures:   Temporary dialysis catheter as well as AV fistula placement by vascular surgery on 11/03/2017  Antimicrobials: Anti-infectives (From admission, onward)   Start     Dose/Rate Route Frequency Ordered Stop   11/03/17 1107  ceFAZolin (ANCEF) 2-4 GM/100ML-% IVPB    Note to Pharmacy:  Lisette Grinder   : cabinet override      11/03/17 1107 11/03/17 2314   11/03/17 0600  cefUROXime (ZINACEF) 1.5 g in sodium chloride 0.9 % 100 mL IVPB     1.5 g 200 mL/hr over 30 Minutes Intravenous To ShortStay Surgical 11/02/17 1416 11/04/17 0600      Subjective: Without complaints at this time  Objective: Vitals:   11/03/17 2243 11/03/17 2250 11/04/17 0425 11/04/17 0735  BP: (!) 162/92 (!) 162/88 (!) 144/77 (!) 146/80  Pulse: 68 67 68 68  Resp: 17 16 18 18   Temp:  98 F (36.7 C) 98.1  F (36.7 C) 98.2 F (36.8 C)  TempSrc:  Oral Oral Oral  SpO2: 97% 97% 94% 93%  Weight:  122.5 kg (270 lb 1 oz)    Height:        Intake/Output Summary (Last 24 hours) at 11/04/2017 1529 Last data filed at 11/04/2017 1307 Gross per 24 hour  Intake 360 ml  Output 1000 ml  Net -640 ml   Filed Weights   11/03/17 1104 11/03/17 2033  11/03/17 2250  Weight: 122.9 kg (271 lb) 123.5 kg (272 lb 4.3 oz) 122.5 kg (270 lb 1 oz)    Examination: General exam: Conversant, in no acute distress Respiratory system: normal chest rise, clear, no audible wheezing Cardiovascular system: regular rhythm, s1-s2 Gastrointestinal system: Nondistended, nontender, pos BS Central nervous system: No seizures, no tremors Extremities: No cyanosis, no joint deformities Skin: No rashes, no pallor Psychiatry: Affect normal // no auditory hallucinations   Data Reviewed: I have personally reviewed following labs and imaging studies  CBC: Recent Labs  Lab 11/02/17 1022 11/03/17 0633  WBC 5.2 5.3  HGB 9.9* 9.6*  HCT 31.9* 30.8*  MCV 94.4 93.3  PLT 172 277*   Basic Metabolic Panel: Recent Labs  Lab 11/02/17 1022 11/03/17 0633  NA 139 138  K 3.8 3.7  CL 96* 96*  CO2 20* 19*  GLUCOSE 88 85  BUN 143* 153*  CREATININE 24.42* 23.66*  CALCIUM 9.6 9.2  MG 3.3*  --   PHOS 11.6*  --    GFR: Estimated Creatinine Clearance: 4.8 mL/min (A) (by C-G formula based on SCr of 23.66 mg/dL (H)). Liver Function Tests: Recent Labs  Lab 11/02/17 1022 11/03/17 0633 11/03/17 2045  AST 9* 6*  --   ALT 11* 8* 9*  ALKPHOS 41 38  --   BILITOT 1.1 1.0  --   PROT 7.2 6.7  --   ALBUMIN 4.3 4.0  --    No results for input(s): LIPASE, AMYLASE in the last 168 hours. No results for input(s): AMMONIA in the last 168 hours. Coagulation Profile: Recent Labs  Lab 11/02/17 1200 11/03/17 0633  INR 1.26 1.16   Cardiac Enzymes: No results for input(s): CKTOTAL, CKMB, CKMBINDEX, TROPONINI in the last 168 hours. BNP (last 3 results) No results for input(s): PROBNP in the last 8760 hours. HbA1C: Recent Labs    11/02/17 1022  HGBA1C 4.7*   CBG: Recent Labs  Lab 11/02/17 1221 11/02/17 1710 11/03/17 1136 11/03/17 1401 11/03/17 1719  GLUCAP 88 91 88 87 77   Lipid Profile: No results for input(s): CHOL, HDL, LDLCALC, TRIG, CHOLHDL, LDLDIRECT in  the last 72 hours. Thyroid Function Tests: No results for input(s): TSH, T4TOTAL, FREET4, T3FREE, THYROIDAB in the last 72 hours. Anemia Panel: No results for input(s): VITAMINB12, FOLATE, FERRITIN, TIBC, IRON, RETICCTPCT in the last 72 hours. Sepsis Labs: No results for input(s): PROCALCITON, LATICACIDVEN in the last 168 hours.  Recent Results (from the past 240 hour(s))  Surgical pcr screen     Status: Abnormal   Collection Time: 11/03/17  7:27 AM  Result Value Ref Range Status   MRSA, PCR NEGATIVE NEGATIVE Final   Staphylococcus aureus POSITIVE (A) NEGATIVE Final    Comment: (NOTE) The Xpert SA Assay (FDA approved for NASAL specimens in patients 78 years of age and older), is one component of a comprehensive surveillance program. It is not intended to diagnose infection nor to guide or monitor treatment. Performed at [facility], [place] [address], [place], [place]  [address]      Radiology Studies: Dg Chest Port 1 View  Result Date: 11/03/2017 CLINICAL DATA:  Status post dialysis catheter insertion. EXAM: PORTABLE CHEST 1 VIEW COMPARISON:  07/01/2016 FINDINGS: A right jugular dialysis catheter has been placed and terminates over the lower SVC. The cardiac silhouette is mildly enlarged. There is central pulmonary vascular congestion without overt edema. Streaky opacities are present in both lung bases, and there is a small left pleural effusion. No pneumothorax is identified. No acute osseous abnormality is seen. IMPRESSION: 1. Right jugular dialysis catheter as above.  No pneumothorax. 2. Mild pulmonary vascular congestion. 3. Bibasilar atelectasis or pneumonia.  Small left pleural effusion. Electronically Signed   By: Logan Bores M.D.   On: 11/03/2017 14:50   Dg Fluoro Guide Cv Line-no Report  Result Date: 11/03/2017 Fluoroscopy was utilized by the requesting physician.  No radiographic interpretation.    Scheduled Meds: . carvedilol  25 mg Oral BID  . Chlorhexidine  Gluconate Cloth  6 each Topical Daily  . heparin injection (subcutaneous)  5,000 Units Subcutaneous Q8H  . hydrALAZINE  100 mg Oral TID  . lanthanum  1,000 mg Oral TID WC  . mupirocin ointment  1 application Nasal BID  . NIFEdipine  30 mg Oral Daily  . polyethylene glycol  17 g Oral BID  . simvastatin  20 mg Oral QHS   Continuous Infusions: . sodium chloride    . sodium chloride    . sodium chloride    . sodium chloride       LOS: 2 days   Marylu Lund, MD [facility] Pager [phone]  If 7PM-7AM, please contact night-coverage [email or web address] Password [facility] 11/04/2017, 3:29 PM

## 2017-11-05 LAB — CBC
HCT: 36.3 % — ABNORMAL LOW (ref 39.0–52.0)
Hemoglobin: 11.5 g/dL — ABNORMAL LOW (ref 13.0–17.0)
MCH: 29.2 pg (ref 26.0–34.0)
MCHC: 31.7 g/dL (ref 30.0–36.0)
MCV: 92.1 fL (ref 78.0–100.0)
Platelets: 71 10*3/uL — ABNORMAL LOW (ref 150–400)
RBC: 3.94 MIL/uL — ABNORMAL LOW (ref 4.22–5.81)
RDW: 15.6 % — ABNORMAL HIGH (ref 11.5–15.5)
WBC: 2.7 10*3/uL — ABNORMAL LOW (ref 4.0–10.5)

## 2017-11-05 LAB — RENAL FUNCTION PANEL
Albumin: 3.7 g/dL (ref 3.5–5.0)
Anion gap: 16 — ABNORMAL HIGH (ref 5–15)
BUN: 103 mg/dL — ABNORMAL HIGH (ref 6–20)
CO2: 24 mmol/L (ref 22–32)
Calcium: 9 mg/dL (ref 8.9–10.3)
Chloride: 98 mmol/L — ABNORMAL LOW (ref 101–111)
Creatinine, Ser: 19.27 mg/dL — ABNORMAL HIGH (ref 0.61–1.24)
GFR calc Af Amer: 3 mL/min — ABNORMAL LOW (ref >60)
GFR calc non Af Amer: 2 mL/min — ABNORMAL LOW (ref >60)
Glucose, Bld: 93 mg/dL (ref 65–99)
Phosphorus: 8.8 mg/dL — ABNORMAL HIGH (ref 2.5–4.6)
Potassium: 3.7 mmol/L (ref 3.5–5.1)
Sodium: 138 mmol/L (ref 135–145)

## 2017-11-05 NOTE — Progress Notes (Signed)
PROGRESS NOTE    Adrian Frye  XAJ:[id number] DOB: [birth year] DOA: 11/02/2017 PCP: Seward Carol, MD    Brief Narrative:  47 y.o. male with medical history significant for CKD 5, hypertension, chronic diastolic CHF, OSA, anemia of chronic disease, type 2 diabetes, asthma, morbid obesity, who presented to ED [facility] as recommended by his primary care provider due to worsening creatinine and worsening generalized weakness and poor appetite.  Onset of symptoms 7 days ago and have been gradually worsening.  Went to his PCP yesterday 11/01/17 labs were drawn. Results came back last night for which his primary care provider recommended him to come to the ED due to abnormal labs.  Denies lower extremity edema, chest pain, orthopnea, or dyspnea.  ED Course: On presentation to the ED, VS revealed accelerated hypertension.  Lab studies remarkable for BUN 143, creatinine of 24, and GFR of 2.  Baseline creatinine 7 a year ago.  Reports he still makes urine.  Been declining dialysis for the past year, however today he is agreeable if necessary.  Assessment & Plan:   Active Problems:   Acute prerenal azotemia  Acute prerenal azotemia/AKI on CKD 5 -BUN 124 creatinine of 24 with baseline creatinine of 7 a year ago -Being followed outpatient by nephrologist Dr. Quincy Simmonds at [facility] -Previously declined hemodialysis.  Patient now agreeable for dialysis -Nephrology consulted and is following. -Vascular surgery was consulted, patient is now status post placement of temporary dialysis catheter and AV fistula placement. -Continuing with dialysis per Nephrology -CLIP in progress  CKD 5 -Family history of chronic kidney disease with mother who was previously on dialysis -Now on HD -Plan as per above  Type 2 diabetes -previous A1c 7.1 on on 09/16/2009  -repeat a1c of 4.7 -random glucose stable -Patient requests no further finger sticks. Will hold off and instead follow random glucose per renal  panel  Anemia of chronic disease due to advanced CKD -Hemoglobin stable at 9 -No sign of overt bleeding at this time -CBC had remained stable  Accelerated hypertension -Continue home medications as tolerated -Currently on Coreg 25 mg twice daily, Hydralazine 100 mg 3 times daily, nifedipine 30 mg daily -BP currently stable  Gout -Held colchicine 0.6 mg tablet due poor renal function -will continue allopurinol as tolerated -Ambulating  Hyperlipidemia -Continue Zocor as patient tolerates  Morbid obesity -Recommend weight loss outpatient -Patient ambulating  OSA CPAP at night  DVT prophylaxis: heparin subq Code Status: full Family Communication: pt in room Disposition Plan: uncertain at this time  Consultants:   Nephrology  Vascular surgery  Procedures:   Temporary dialysis catheter as well as AV fistula placement by vascular surgery on 11/03/2017  Antimicrobials: Anti-infectives (From admission, onward)   Start     Dose/Rate Route Frequency Ordered Stop   11/03/17 1107  ceFAZolin (ANCEF) 2-4 GM/100ML-% IVPB    Note to Pharmacy:  Lisette Grinder   : cabinet override      11/03/17 1107 11/03/17 2314   11/03/17 0600  cefUROXime (ZINACEF) 1.5 g in sodium chloride 0.9 % 100 mL IVPB     1.5 g 200 mL/hr over 30 Minutes Intravenous To ShortStay Surgical 11/02/17 1416 11/04/17 0600      Subjective: No complaints this AM  Objective: Vitals:   11/05/17 1030 11/05/17 1100 11/05/17 1130 11/05/17 1200  BP: (!) 161/100 (!) 155/101 (!) 163/98 (!) 152/99  Pulse: 63 68 66 66  Resp:    18  Temp:    98.7 F (37.1 C)  TempSrc:    Oral  SpO2:    98%  Weight:    119.7 kg (263 lb 14.3 oz)  Height:        Intake/Output Summary (Last 24 hours) at 11/05/2017 1526 Last data filed at 11/05/2017 1433 Gross per 24 hour  Intake 660 ml  Output 1000 ml  Net -340 ml   Filed Weights   11/04/17 2053 11/05/17 0850 11/05/17 1200  Weight: 122.8 kg (270 lb 11.6 oz) 120.7 kg  (266 lb 1.5 oz) 119.7 kg (263 lb 14.3 oz)    Examination: General exam: Awake, laying in bed, in nad Respiratory system: Normal respiratory effort, no wheezing Cardiovascular system: regular rate, s1, s2  Data Reviewed: I have personally reviewed following labs and imaging studies  CBC: Recent Labs  Lab 11/02/17 1022 11/03/17 0633 11/05/17 1012  WBC 5.2 5.3 2.7*  HGB 9.9* 9.6* 11.5*  HCT 31.9* 30.8* 36.3*  MCV 94.4 93.3 92.1  PLT 172 134* 71*   Basic Metabolic Panel: Recent Labs  Lab 11/02/17 1022 11/03/17 0633 11/05/17 0949  NA 139 138 138  K 3.8 3.7 3.7  CL 96* 96* 98*  CO2 20* 19* 24  GLUCOSE 88 85 93  BUN 143* 153* 103*  CREATININE 24.42* 23.66* 19.27*  CALCIUM 9.6 9.2 9.0  MG 3.3*  --   --   PHOS 11.6*  --  8.8*   GFR: Estimated Creatinine Clearance: 5.9 mL/min (A) (by C-G formula based on SCr of 19.27 mg/dL (H)). Liver Function Tests: Recent Labs  Lab 11/02/17 1022 11/03/17 0633 11/03/17 2045 11/05/17 0949  AST 9* 6*  --   --   ALT 11* 8* 9*  --   ALKPHOS 41 38  --   --   BILITOT 1.1 1.0  --   --   PROT 7.2 6.7  --   --   ALBUMIN 4.3 4.0  --  3.7   No results for input(s): LIPASE, AMYLASE in the last 168 hours. No results for input(s): AMMONIA in the last 168 hours. Coagulation Profile: Recent Labs  Lab 11/02/17 1200 11/03/17 0633  INR 1.26 1.16   Cardiac Enzymes: No results for input(s): CKTOTAL, CKMB, CKMBINDEX, TROPONINI in the last 168 hours. BNP (last 3 results) No results for input(s): PROBNP in the last 8760 hours. HbA1C: No results for input(s): HGBA1C in the last 72 hours. CBG: Recent Labs  Lab 11/02/17 1221 11/02/17 1710 11/03/17 1136 11/03/17 1401 11/03/17 1719  GLUCAP 88 91 88 87 77   Lipid Profile: No results for input(s): CHOL, HDL, LDLCALC, TRIG, CHOLHDL, LDLDIRECT in the last 72 hours. Thyroid Function Tests: No results for input(s): TSH, T4TOTAL, FREET4, T3FREE, THYROIDAB in the last 72 hours. Anemia Panel: No  results for input(s): VITAMINB12, FOLATE, FERRITIN, TIBC, IRON, RETICCTPCT in the last 72 hours. Sepsis Labs: No results for input(s): PROCALCITON, LATICACIDVEN in the last 168 hours.  Recent Results (from the past 240 hour(s))  Surgical pcr screen     Status: Abnormal   Collection Time: 11/03/17  7:27 AM  Result Value Ref Range Status   MRSA, PCR NEGATIVE NEGATIVE Final   Staphylococcus aureus POSITIVE (A) NEGATIVE Final    Comment: (NOTE) The Xpert SA Assay (FDA approved for NASAL specimens in patients 96 years of age and older), is one component of a comprehensive surveillance program. It is not intended to diagnose infection nor to guide or monitor treatment. Performed at [facility], [place] [address], [place], [place] [address]  Radiology Studies: No results found.  Scheduled Meds: . carvedilol  25 mg Oral BID  . Chlorhexidine Gluconate Cloth  6 each Topical Daily  . Chlorhexidine Gluconate Cloth  6 each Topical Q0600  . heparin injection (subcutaneous)  5,000 Units Subcutaneous Q8H  . hydrALAZINE  100 mg Oral TID  . lanthanum  1,000 mg Oral TID WC  . mupirocin ointment  1 application Nasal BID  . NIFEdipine  30 mg Oral Daily  . polyethylene glycol  17 g Oral BID  . simvastatin  20 mg Oral QHS   Continuous Infusions: . sodium chloride    . sodium chloride    . sodium chloride    . sodium chloride       LOS: 3 days   Marylu Lund, MD [facility] Pager [phone]  If 7PM-7AM, please contact night-coverage [email or web address] Password [facility] 11/05/2017, 3:26 PM

## 2017-11-05 NOTE — Progress Notes (Signed)
  [facility] Progress Note   Assessment/ Plan:   1. End stage renal disease/CKD stage 5- he is now with uremic symptoms and is amenable to have vascular access placement and initiate HD.  Dr. Marval Regal also discussed other modalities of RRT including incenter HD, home HD, peritoneal dialysis as well as kidney transplantation.  s/p TDC and AVF 6/21, HD #1 6/21, HD #2 today 6/23 2. Anemia of CKD stage 5- will start ESA therapy with HD and follow iron stores 3. Secondary HPTH- iPTH was 163 on 08/18/17, phos 7.6, ca 9 will start binders and vit D 4. HTN- continue outpatient meds and UF with HD 5. DM- per primary 6. Vascular access- as above 7. Disposition- CLIP started   Subjective:    HD #2, rolled over from yesterday.  No complaints.     Objective:   BP (!) 152/99 (BP Location: Right Arm)   Pulse 66   Temp 98.7 F (37.1 C) (Oral)   Resp 18   Ht 5\' 7"  (1.702 m)   Wt 119.7 kg (263 lb 14.3 oz)   SpO2 98%   BMI 41.33 kg/m   Physical Exam: Gen: NAD, sitting in bed CVS: RRR no rub Resp: clear bilaterally YKD:XIPJAS distended Ext: no LE edema ACCESS: R IJ TDC dressing c/d/i, LUE AVF + T/B  Labs: BMET Recent Labs  Lab 11/02/17 1022 11/03/17 0633 11/05/17 0949  NA 139 138 138  K 3.8 3.7 3.7  CL 96* 96* 98*  CO2 20* 19* 24  GLUCOSE 88 85 93  BUN 143* 153* 103*  CREATININE 24.42* 23.66* 19.27*  CALCIUM 9.6 9.2 9.0  PHOS 11.6*  --  8.8*   CBC Recent Labs  Lab 11/02/17 1022 11/03/17 0633 11/05/17 1012  WBC 5.2 5.3 2.7*  HGB 9.9* 9.6* 11.5*  HCT 31.9* 30.8* 36.3*  MCV 94.4 93.3 92.1  PLT 172 134* 71*    @IMGRELPRIORS @ Medications:    . carvedilol  25 mg Oral BID  . Chlorhexidine Gluconate Cloth  6 each Topical Daily  . Chlorhexidine Gluconate Cloth  6 each Topical Q0600  . heparin injection (subcutaneous)  5,000 Units Subcutaneous Q8H  . hydrALAZINE  100 mg Oral TID  . lanthanum  1,000 mg Oral TID WC  . mupirocin ointment  1 application Nasal  BID  . NIFEdipine  30 mg Oral Daily  . polyethylene glycol  17 g Oral BID  . simvastatin  20 mg Oral QHS     Madelon Lips, MD 11/05/2017, 1:53 PM

## 2017-11-05 NOTE — Procedures (Signed)
Patient seen and examined on Hemodialysis. QB 250 mL/ min UF goal 1.5L  Treatment adjusted as needed.  Madelon Lips MD [facility] pgr [phone] 1:54 PM

## 2017-11-06 ENCOUNTER — Encounter (HOSPITAL_COMMUNITY): Payer: Self-pay | Admitting: Vascular Surgery

## 2017-11-06 ENCOUNTER — Other Ambulatory Visit: Payer: Self-pay

## 2017-11-06 DIAGNOSIS — Z48812 Encounter for surgical aftercare following surgery on the circulatory system: Secondary | ICD-10-CM

## 2017-11-06 DIAGNOSIS — Z992 Dependence on renal dialysis: Principal | ICD-10-CM

## 2017-11-06 DIAGNOSIS — N186 End stage renal disease: Secondary | ICD-10-CM

## 2017-11-06 LAB — HEPATITIS B CORE ANTIBODY, TOTAL: Hep B Core Total Ab: NEGATIVE

## 2017-11-06 LAB — HEPATITIS B SURFACE ANTIBODY,QUALITATIVE: Hep B S Ab: NONREACTIVE

## 2017-11-06 NOTE — Progress Notes (Signed)
Patient has home CPAP and places himself on and off as needed. Will call if any assistance needed. 

## 2017-11-06 NOTE — Progress Notes (Signed)
[facility] NEPHROLOGY PROGRESS NOTE  Assessment/ Plan: Pt is a 47 y.o. yo male history of hypertension, CHF, type 2 diabetes, morbid obesity, CKD stage V admitted with generalized weakness, poor oral intake and worsening renal failure.  She is progressed to ESRD.  Assessment/Plan:  # ESRD/CKD stage V with uremic symptoms: Status post tunneled catheter and AV fistula creation on 11/03/17 and initiation of dialysis.  Tolerating well.  Plan for third dialysis today.  Tolerating well. -CLIP started  # Anemia: Due to CKD: Check iron studies.  Hemoglobin 11.5.  Start ESA after iron yesterday.  # Secondary hyperparathyroidism: On Phosrenol, phosphorus level trending down to 8.8.  PTH 169.7.  # HTN/volume: Blood pressure mildly elevated.  Current medication.  Dialysis to help with volume management and blood pressure.   Subjective: Seen and examined at bedside.  Denies headache, dizziness, nausea vomiting chest pain. Objective Vital signs in last 24 hours: Vitals:   11/05/17 1640 11/05/17 2020 11/06/17 0250 11/06/17 0728  BP: 139/72 (!) 155/76 (!) 153/79 (!) 162/89  Pulse: 72 72 70 72  Resp: 18 18 17 18   Temp: 98.2 F (36.8 C) 98.2 F (36.8 C) 98.3 F (36.8 C) 98.3 F (36.8 C)  TempSrc: Oral Oral Oral Oral  SpO2: 95% 97% 97% 94%  Weight:  119.9 kg (264 lb 5.3 oz)    Height:       Weight change: -2.1 kg (-4 lb 10.1 oz)  Intake/Output Summary (Last 24 hours) at 11/06/2017 1552 Last data filed at 11/06/2017 1358 Gross per 24 hour  Intake 780 ml  Output -  Net 780 ml       Labs: Basic Metabolic Panel: Recent Labs  Lab 11/02/17 1022 11/03/17 0633 11/05/17 0949  NA 139 138 138  K 3.8 3.7 3.7  CL 96* 96* 98*  CO2 20* 19* 24  GLUCOSE 88 85 93  BUN 143* 153* 103*  CREATININE 24.42* 23.66* 19.27*  CALCIUM 9.6 9.2 9.0  PHOS 11.6*  --  8.8*   Liver Function Tests: Recent Labs  Lab 11/02/17 1022 11/03/17 0633 11/03/17 2045 11/05/17 0949  AST 9* 6*  --    --   ALT 11* 8* 9*  --   ALKPHOS 41 38  --   --   BILITOT 1.1 1.0  --   --   PROT 7.2 6.7  --   --   ALBUMIN 4.3 4.0  --  3.7   No results for input(s): LIPASE, AMYLASE in the last 168 hours. No results for input(s): AMMONIA in the last 168 hours. CBC: Recent Labs  Lab 11/02/17 1022 11/03/17 0633 11/05/17 1012  WBC 5.2 5.3 2.7*  HGB 9.9* 9.6* 11.5*  HCT 31.9* 30.8* 36.3*  MCV 94.4 93.3 92.1  PLT 172 134* 71*   Cardiac Enzymes: No results for input(s): CKTOTAL, CKMB, CKMBINDEX, TROPONINI in the last 168 hours. CBG: Recent Labs  Lab 11/02/17 1221 11/02/17 1710 11/03/17 1136 11/03/17 1401 11/03/17 1719  GLUCAP 88 91 88 87 77    Iron Studies: No results for input(s): IRON, TIBC, TRANSFERRIN, FERRITIN in the last 72 hours. Studies/Results: No results found.  Medications: Infusions: . sodium chloride    . sodium chloride    . sodium chloride    . sodium chloride      Scheduled Medications: . carvedilol  25 mg Oral BID  . Chlorhexidine Gluconate Cloth  6 each Topical Daily  . Chlorhexidine Gluconate Cloth  6 each Topical Q0600  . heparin  injection (subcutaneous)  5,000 Units Subcutaneous Q8H  . hydrALAZINE  100 mg Oral TID  . lanthanum  1,000 mg Oral TID WC  . mupirocin ointment  1 application Nasal BID  . NIFEdipine  30 mg Oral Daily  . polyethylene glycol  17 g Oral BID  . simvastatin  20 mg Oral QHS    have reviewed scheduled and prn medications.  Physical Exam: General:NAD, comfortable Heart:RRR, s1s2 nl Lungs:clear b/l, no crackle or wheeze Abdomen:soft, Non-tender, non-distended Extremities:No edema Dialysis Access: Right IJ TDC, left upper extremity AV fistula with positive thrill and bruit.  Adrian Frye 11/06/2017,3:52 PM  LOS: 4 days

## 2017-11-06 NOTE — Anesthesia Postprocedure Evaluation (Signed)
Anesthesia Post Note  Patient: Adrian Frye  Procedure(s) Performed: ARTERIOVENOUS (AV) FISTULA CREATION  left upper arm (Left Arm Upper) INSERTION OF DIALYSIS CATHETER - RIGHT INTERNAL JUGULAR PLACEMENT (N/A Chest)     Patient location during evaluation: PACU Anesthesia Type: MAC Level of consciousness: awake and alert Pain management: pain level controlled Vital Signs Assessment: post-procedure vital signs reviewed and stable Respiratory status: spontaneous breathing, nonlabored ventilation, respiratory function stable and patient connected to nasal cannula oxygen Cardiovascular status: stable and blood pressure returned to baseline Postop Assessment: no apparent nausea or vomiting Anesthetic complications: no    Last Vitals:  Vitals:   11/06/17 0250 11/06/17 0728  BP: (!) 153/79 (!) 162/89  Pulse: 70 72  Resp: 17 18  Temp: 36.8 C 36.8 C  SpO2: 97% 94%    Last Pain:  Vitals:   11/06/17 0728  TempSrc: Oral  PainSc:                  Katriona Schmierer

## 2017-11-06 NOTE — Progress Notes (Signed)
PROGRESS NOTE    GEORGIA BARIA  AVW:[id number] DOB: [birth year] DOA: 11/02/2017 PCP: Seward Carol, MD    Brief Narrative:  47 y.o. male with medical history significant for CKD 5, hypertension, chronic diastolic CHF, OSA, anemia of chronic disease, type 2 diabetes, asthma, morbid obesity, who presented to ED [facility] as recommended by his primary care provider due to worsening creatinine and worsening generalized weakness and poor appetite.  Onset of symptoms 7 days ago and have been gradually worsening.  Went to his PCP yesterday 11/01/17 labs were drawn. Results came back last night for which his primary care provider recommended him to come to the ED due to abnormal labs.  Denies lower extremity edema, chest pain, orthopnea, or dyspnea.  ED Course: On presentation to the ED, VS revealed accelerated hypertension.  Lab studies remarkable for BUN 143, creatinine of 24, and GFR of 2.  Baseline creatinine 7 a year ago.  Reports he still makes urine.  Been declining dialysis for the past year, however today he is agreeable if necessary.  Assessment & Plan:   Active Problems:   Acute prerenal azotemia  Acute prerenal azotemia/AKI on CKD 5 -BUN 124 creatinine of 24 with baseline creatinine of 7 a year ago -Being followed outpatient by nephrologist Dr. Quincy Simmonds at [facility] -Previously declined hemodialysis.  Patient now agreeable for dialysis -Nephrology consulted and is following. -Vascular surgery was consulted, patient is now status post placement of temporary dialysis catheter and AV fistula placement. -Continuing with dialysis per Nephrology -CLIP currently in progress  CKD 5 -Family history of chronic kidney disease with mother who was previously on dialysis -Now on HD -Plan as per above  Type 2 diabetes -previous A1c 7.1 on on 09/16/2009  -repeat a1c of 4.7 -random glucose stable -Patient requests no further finger sticks. Will hold off and instead follow random glucose per renal  panel  Anemia of chronic disease due to advanced CKD -Hemoglobin stable at 9 -No sign of overt bleeding at this time -CBC had remained stable  Accelerated hypertension -Continue home medications as tolerated -Currently on Coreg 25 mg twice daily, Hydralazine 100 mg 3 times daily, nifedipine 30 mg daily -BP currently stable  Gout -Held colchicine 0.6 mg tablet due poor renal function -will continue allopurinol as tolerated -Ambulating  Hyperlipidemia -Continue Zocor as patient tolerates  Morbid obesity -Recommend weight loss outpatient -Patient ambulating  OSA CPAP at night  DVT prophylaxis: heparin subq Code Status: full Family Communication: pt in room Disposition Plan: uncertain at this time  Consultants:   Nephrology  Vascular surgery  Procedures:   Temporary dialysis catheter as well as AV fistula placement by vascular surgery on 11/03/2017  Antimicrobials: Anti-infectives (From admission, onward)   Start     Dose/Rate Route Frequency Ordered Stop   11/03/17 1107  ceFAZolin (ANCEF) 2-4 GM/100ML-% IVPB    Note to Pharmacy:  Lisette Grinder   : cabinet override      11/03/17 1107 11/03/17 2314   11/03/17 0600  cefUROXime (ZINACEF) 1.5 g in sodium chloride 0.9 % 100 mL IVPB     1.5 g 200 mL/hr over 30 Minutes Intravenous To ShortStay Surgical 11/02/17 1416 11/04/17 0600      Subjective: Eager to go home  Objective: Vitals:   11/05/17 1640 11/05/17 2020 11/06/17 0250 11/06/17 0728  BP: 139/72 (!) 155/76 (!) 153/79 (!) 162/89  Pulse: 72 72 70 72  Resp: 18 18 17 18   Temp: 98.2 F (36.8 C) 98.2 F (36.8 C)  98.3 F (36.8 C) 98.3 F (36.8 C)  TempSrc: Oral Oral Oral Oral  SpO2: 95% 97% 97% 94%  Weight:  119.9 kg (264 lb 5.3 oz)    Height:        Intake/Output Summary (Last 24 hours) at 11/06/2017 1531 Last data filed at 11/06/2017 1358 Gross per 24 hour  Intake 780 ml  Output -  Net 780 ml   Filed Weights   11/05/17 0850 11/05/17 1200  11/05/17 2020  Weight: 120.7 kg (266 lb 1.5 oz) 119.7 kg (263 lb 14.3 oz) 119.9 kg (264 lb 5.3 oz)    Examination: General exam: Conversant, in no acute distress Respiratory system: normal chest rise, clear, no audible wheezing Cardiovascular system: regular rhythm, s1-s2  Data Reviewed: I have personally reviewed following labs and imaging studies  CBC: Recent Labs  Lab 11/02/17 1022 11/03/17 0633 11/05/17 1012  WBC 5.2 5.3 2.7*  HGB 9.9* 9.6* 11.5*  HCT 31.9* 30.8* 36.3*  MCV 94.4 93.3 92.1  PLT 172 134* 71*   Basic Metabolic Panel: Recent Labs  Lab 11/02/17 1022 11/03/17 0633 11/05/17 0949  NA 139 138 138  K 3.8 3.7 3.7  CL 96* 96* 98*  CO2 20* 19* 24  GLUCOSE 88 85 93  BUN 143* 153* 103*  CREATININE 24.42* 23.66* 19.27*  CALCIUM 9.6 9.2 9.0  MG 3.3*  --   --   PHOS 11.6*  --  8.8*   GFR: Estimated Creatinine Clearance: 5.9 mL/min (A) (by C-G formula based on SCr of 19.27 mg/dL (H)). Liver Function Tests: Recent Labs  Lab 11/02/17 1022 11/03/17 0633 11/03/17 2045 11/05/17 0949  AST 9* 6*  --   --   ALT 11* 8* 9*  --   ALKPHOS 41 38  --   --   BILITOT 1.1 1.0  --   --   PROT 7.2 6.7  --   --   ALBUMIN 4.3 4.0  --  3.7   No results for input(s): LIPASE, AMYLASE in the last 168 hours. No results for input(s): AMMONIA in the last 168 hours. Coagulation Profile: Recent Labs  Lab 11/02/17 1200 11/03/17 0633  INR 1.26 1.16   Cardiac Enzymes: No results for input(s): CKTOTAL, CKMB, CKMBINDEX, TROPONINI in the last 168 hours. BNP (last 3 results) No results for input(s): PROBNP in the last 8760 hours. HbA1C: No results for input(s): HGBA1C in the last 72 hours. CBG: Recent Labs  Lab 11/02/17 1221 11/02/17 1710 11/03/17 1136 11/03/17 1401 11/03/17 1719  GLUCAP 88 91 88 87 77   Lipid Profile: No results for input(s): CHOL, HDL, LDLCALC, TRIG, CHOLHDL, LDLDIRECT in the last 72 hours. Thyroid Function Tests: No results for input(s): TSH,  T4TOTAL, FREET4, T3FREE, THYROIDAB in the last 72 hours. Anemia Panel: No results for input(s): VITAMINB12, FOLATE, FERRITIN, TIBC, IRON, RETICCTPCT in the last 72 hours. Sepsis Labs: No results for input(s): PROCALCITON, LATICACIDVEN in the last 168 hours.  Recent Results (from the past 240 hour(s))  Surgical pcr screen     Status: Abnormal   Collection Time: 11/03/17  7:27 AM  Result Value Ref Range Status   MRSA, PCR NEGATIVE NEGATIVE Final   Staphylococcus aureus POSITIVE (A) NEGATIVE Final    Comment: (NOTE) The Xpert SA Assay (FDA approved for NASAL specimens in patients 55 years of age and older), is one component of a comprehensive surveillance program. It is not intended to diagnose infection nor to guide or monitor treatment. Performed at [facility]  Lab, [address], [place],  [address]      Radiology Studies: No results found.  Scheduled Meds: . carvedilol  25 mg Oral BID  . Chlorhexidine Gluconate Cloth  6 each Topical Daily  . Chlorhexidine Gluconate Cloth  6 each Topical Q0600  . heparin injection (subcutaneous)  5,000 Units Subcutaneous Q8H  . hydrALAZINE  100 mg Oral TID  . lanthanum  1,000 mg Oral TID WC  . mupirocin ointment  1 application Nasal BID  . NIFEdipine  30 mg Oral Daily  . polyethylene glycol  17 g Oral BID  . simvastatin  20 mg Oral QHS   Continuous Infusions: . sodium chloride    . sodium chloride    . sodium chloride    . sodium chloride       LOS: 4 days   Marylu Lund, MD [facility] Pager [phone]  If 7PM-7AM, please contact night-coverage [email or web address] Password [facility] 11/06/2017, 3:31 PM

## 2017-11-07 MED ORDER — KIDNEY FAILURE BOOK
Freq: Once | Status: AC
  Administered 2017-11-07: 11:00:00
  Filled 2017-11-07: qty 1

## 2017-11-07 MED ORDER — CHLORHEXIDINE GLUCONATE CLOTH 2 % EX PADS: 6.0000 | MEDICATED_PAD | Freq: Every day | CUTANEOUS | Status: DC

## 2017-11-07 NOTE — Progress Notes (Signed)
Nutrition Education Note  RD consulted for Renal Education. Provided handouts from [facility] including "Dietary Guidelines for Patients Starting Dialysis" as well as separate handouts on "Potassium and your CKD" and "Phosphorus and your CKD.". Reviewed food groups and provided written recommended serving sizes specifically determined for patient's current nutritional status.   Explained why diet restrictions are needed and provided lists of foods to limit/avoid that are high potassium, sodium, and phosphorus. Provided specific recommendations on safer alternatives of these foods. Strongly encouraged compliance of this diet.   Discussed importance of protein intake at each meal and snack. Provided examples of how to maximize protein intake throughout the day. Pt initially stated that he was a vegetarian, but then indicated that he ate fish and chicken/turkey (pt does not eat beef, pork, eggs, dairy). Discussed need for fluid restriction with dialysis, importance of minimizing weight gain between HD treatments, and renal-friendly beverage options.  Reviewed importance of taking renal vitamin daily in the evening. Pt started on Fosrenol, reviewed importance of taking phosphorus binder with all meals/snacks as prescribed by MD. Pt verbalizes that he was prescribed tums as phosphorus binder several months ago as an outpatient as phosphorus level had been high; reports he had already been educated on foods high in phosphorus.   Encouraged pt to discuss specific diet questions/concerns with RD at HD outpatient facility. Teach back method used.  Spoke to wife, Steffanie Dunn, on the phone as well. Reviewed basics of the diet and reinforced the importance of following up with outpatient RD.  Expect good compliance.  Body mass index is 41.75 kg/m. Pt meets criteria for morbid obesity based on current BMI.  Current diet order is Renal, patient is consuming approximately 75-100% of meals at this time. Labs and medications  reviewed. No further nutrition interventions warranted at this time. RD contact information provided. If additional nutrition issues arise, please re-consult RD.  Kerman Passey MS, RD, [place], [place] [phone] Pager  [phone] Weekend/On-Call Pager

## 2017-11-07 NOTE — Progress Notes (Addendum)
Returned call to wife Steffanie Dunn at [phone], left message.   Spoke w wife, she had general question of what all services he could get since he was to start HD.  Explained that there is not a list of services he would be getting. Discussed that HD patients typically qualify for Medicaid coverage. She confirmed that she had spoke w Development worker, community and that the application process had been started. CM confirmed that they had a car, and could provide transport. We talked a little more about transportation to HD and that Medicaid and HD centers can assist with transport as well as needed.  She requested a RD consult to learn about his diet needs after DC but stated that she is at work from 8-5 and will not be at the hospital. We discussed that they RD could consult and speak to over the phone with recommendations and that they would be emphasized with DC intstructions. CM placed consult w RD and free texted phone number to call wife w recs.  Also discussed site care for fistula would be taught at DC. Spoke about follow ups. Patient will initially follow up w vascular but would be under nephrology for long term HD management. She requested named of nephrologists who have seen patient and they were provided. Encouraged her to call back with any further questions.

## 2017-11-07 NOTE — Progress Notes (Signed)
Accepted at [place] .1st treatment Thursday June 27,2019 at 11:50am .Schedule and chairtime is Tuesday,Thursday,Saturday at 12:50 .2nd shift

## 2017-11-07 NOTE — Progress Notes (Signed)
PROGRESS NOTE    Adrian Frye  ZJI:[id number] DOB: February 18, 1971 DOA: 11/02/2017 PCP: Seward Carol, MD    Brief Narrative:  47 y.o. male with medical history significant for CKD 5, hypertension, chronic diastolic CHF, OSA, anemia of chronic disease, type 2 diabetes, asthma, morbid obesity, who presented to ED [facility] as recommended by his primary care provider due to worsening creatinine and worsening generalized weakness and poor appetite.  Onset of symptoms 7 days ago and have been gradually worsening.  Went to his PCP yesterday 11/01/17 labs were drawn. Results came back last night for which his primary care provider recommended him to come to the ED due to abnormal labs.  Denies lower extremity edema, chest pain, orthopnea, or dyspnea.  ED Course: On presentation to the ED, VS revealed accelerated hypertension.  Lab studies remarkable for BUN 143, creatinine of 24, and GFR of 2.  Baseline creatinine 7 a year ago.  Reports he still makes urine.  Been declining dialysis for the past year, however today he is agreeable if necessary.  Assessment & Plan:   Active Problems:   Acute prerenal azotemia  Acute prerenal azotemia/AKI on CKD 5 -BUN 124 creatinine of 24 with baseline creatinine of 7 a year ago -Being followed outpatient by nephrologist Dr. Quincy Simmonds at [facility] -Previously declined hemodialysis.  Patient now agreeable for dialysis -Nephrology consulted and is following. -Vascular surgery was consulted, patient is now status post placement of temporary dialysis catheter and AV fistula placement. -Continuing with dialysis per Nephrology -CLIP remains in progress. Patient is very eager to be discharged  CKD 5 -Family history of chronic kidney disease with mother who was previously on dialysis -Now on HD -Plan as per above  Type 2 diabetes -previous A1c 7.1 on on 09/16/2009  -repeat a1c of 4.7 -random glucose stable -Patient requests no further finger sticks. Will hold off and  instead follow random glucose per renal panel  Anemia of chronic disease due to advanced CKD -Hemoglobin stable at 9 -No sign of overt bleeding at this time -CBC had remained stable  Accelerated hypertension -Continue home medications as tolerated -Currently on Coreg 25 mg twice daily, Hydralazine 100 mg 3 times daily, nifedipine 30 mg daily -BP currently stable  Gout -Held colchicine 0.6 mg tablet due poor renal function -will continue allopurinol as tolerated -Ambulating  Hyperlipidemia -Continue Zocor as patient tolerates  Morbid obesity -Recommend weight loss outpatient -Patient ambulating  OSA CPAP at night  DVT prophylaxis: heparin subq Code Status: full Family Communication: pt in room Disposition Plan: uncertain at this time  Consultants:   Nephrology  Vascular surgery  Procedures:   Temporary dialysis catheter as well as AV fistula placement by vascular surgery on 11/03/2017  Antimicrobials: Anti-infectives (From admission, onward)   Start     Dose/Rate Route Frequency Ordered Stop   11/03/17 1107  ceFAZolin (ANCEF) 2-4 GM/100ML-% IVPB    Note to Pharmacy:  Lisette Grinder   : cabinet override      11/03/17 1107 11/03/17 2314   11/03/17 0600  cefUROXime (ZINACEF) 1.5 g in sodium chloride 0.9 % 100 mL IVPB     1.5 g 200 mL/hr over 30 Minutes Intravenous To [facility] 11/02/17 1416 11/04/17 0600      Subjective: Very eager to go home  Objective: Vitals:   11/06/17 2300 11/06/17 2305 11/06/17 2335 11/07/17 0622  BP: (!) 162/98 (!) 161/96 (!) 150/93 (!) 154/84  Pulse: 63 65 68 73  Resp: 18 18 16  16  Temp:  98.5 F (36.9 C) 98.2 F (36.8 C) 98.3 F (36.8 C)  TempSrc:  Oral Oral Oral  SpO2:  99% 98% 96%  Weight:  120.9 kg (266 lb 8.6 oz) 120.9 kg (266 lb 8.6 oz)   Height:        Intake/Output Summary (Last 24 hours) at 11/07/2017 1450 Last data filed at 11/07/2017 1410 Gross per 24 hour  Intake 600 ml  Output 1000 ml  Net  -400 ml   Filed Weights   11/06/17 1920 11/06/17 2305 11/06/17 2335  Weight: 121.9 kg (268 lb 11.9 oz) 120.9 kg (266 lb 8.6 oz) 120.9 kg (266 lb 8.6 oz)    Examination: General exam: Awake, laying in bed, in nad Respiratory system: Normal respiratory effort, no wheezing Cardiovascular system: regular rate, s1, s2  Data Reviewed: I have personally reviewed following labs and imaging studies  CBC: Recent Labs  Lab 11/02/17 1022 11/03/17 0633 11/05/17 1012  WBC 5.2 5.3 2.7*  HGB 9.9* 9.6* 11.5*  HCT 31.9* 30.8* 36.3*  MCV 94.4 93.3 92.1  PLT 172 134* 71*   Basic Metabolic Panel: Recent Labs  Lab 11/02/17 1022 11/03/17 0633 11/05/17 0949  NA 139 138 138  K 3.8 3.7 3.7  CL 96* 96* 98*  CO2 20* 19* 24  GLUCOSE 88 85 93  BUN 143* 153* 103*  CREATININE 24.42* 23.66* 19.27*  CALCIUM 9.6 9.2 9.0  MG 3.3*  --   --   PHOS 11.6*  --  8.8*   GFR: Estimated Creatinine Clearance: 5.9 mL/min (A) (by C-G formula based on SCr of 19.27 mg/dL (H)). Liver Function Tests: Recent Labs  Lab 11/02/17 1022 11/03/17 0633 11/03/17 2045 11/05/17 0949  AST 9* 6*  --   --   ALT 11* 8* 9*  --   ALKPHOS 41 38  --   --   BILITOT 1.1 1.0  --   --   PROT 7.2 6.7  --   --   ALBUMIN 4.3 4.0  --  3.7   No results for input(s): LIPASE, AMYLASE in the last 168 hours. No results for input(s): AMMONIA in the last 168 hours. Coagulation Profile: Recent Labs  Lab 11/02/17 1200 11/03/17 0633  INR 1.26 1.16   Cardiac Enzymes: No results for input(s): CKTOTAL, CKMB, CKMBINDEX, TROPONINI in the last 168 hours. BNP (last 3 results) No results for input(s): PROBNP in the last 8760 hours. HbA1C: No results for input(s): HGBA1C in the last 72 hours. CBG: Recent Labs  Lab 11/02/17 1221 11/02/17 1710 11/03/17 1136 11/03/17 1401 11/03/17 1719  GLUCAP 88 91 88 87 77   Lipid Profile: No results for input(s): CHOL, HDL, LDLCALC, TRIG, CHOLHDL, LDLDIRECT in the last 72 hours. Thyroid  Function Tests: No results for input(s): TSH, T4TOTAL, FREET4, T3FREE, THYROIDAB in the last 72 hours. Anemia Panel: No results for input(s): VITAMINB12, FOLATE, FERRITIN, TIBC, IRON, RETICCTPCT in the last 72 hours. Sepsis Labs: No results for input(s): PROCALCITON, LATICACIDVEN in the last 168 hours.  Recent Results (from the past 240 hour(s))  Surgical pcr screen     Status: Abnormal   Collection Time: 11/03/17  7:27 AM  Result Value Ref Range Status   MRSA, PCR NEGATIVE NEGATIVE Final   Staphylococcus aureus POSITIVE (A) NEGATIVE Final    Comment: (NOTE) The Xpert SA Assay (FDA approved for NASAL specimens in patients 36 years of age and older), is one component of a comprehensive surveillance program. It is not intended to diagnose  infection nor to guide or monitor treatment. Performed at [facility], [place] [address], [place],  [address]      Radiology Studies: No results found.  Scheduled Meds: . carvedilol  25 mg Oral BID  . Chlorhexidine Gluconate Cloth  6 each Topical Daily  . Chlorhexidine Gluconate Cloth  6 each Topical Q0600  . heparin injection (subcutaneous)  5,000 Units Subcutaneous Q8H  . hydrALAZINE  100 mg Oral TID  . lanthanum  1,000 mg Oral TID WC  . mupirocin ointment  1 application Nasal BID  . NIFEdipine  30 mg Oral Daily  . polyethylene glycol  17 g Oral BID  . simvastatin  20 mg Oral QHS   Continuous Infusions: . sodium chloride    . sodium chloride    . sodium chloride    . sodium chloride       LOS: 5 days   Marylu Lund, MD [facility] Pager [phone]  If 7PM-7AM, please contact night-coverage [email or web address] Password [email or web address] 11/07/2017, 2:50 PM

## 2017-11-07 NOTE — Progress Notes (Signed)
[facility] NEPHROLOGY PROGRESS NOTE  Assessment/ Plan: Pt is a 47 y.o. yo male history of hypertension, CHF, type 2 diabetes, morbid obesity, CKD stage V admitted with generalized weakness, poor oral intake and worsening renal failure.  She is progressed to ESRD.  Assessment/Plan:  # ESRD/CKD stage V with uremic symptoms: Status post tunneled catheter and AV fistula creation on 11/03/17 and initiation of dialysis.  Tolerating well.  Last dialysis yesterday.  Plan for next dialysis tomorrow. -CLIP started Patient was upset and angry because he has to wait for outpatient dialysis arrangement.  I explained the process in detail.  # Anemia: Due to CKD: Check iron studies.  Hemoglobin 11.5.    # Secondary hyperparathyroidism: On Phosrenol, phosphorus level trending down to 8.8.  PTH 169.7.  # HTN/volume: Blood pressure mildly elevated. Continue current medication.  Dialysis to help with volume management and blood pressure.   Subjective: Seen and examined at bedside.  Very upset and angry about the process of obtaining outpatient hemodialysis arrangement.  Also threatening to leave the hospital.  I explained the patient about the process and plan of care.  No chest pain, shortness of breath, nausea vomiting.  Objective Vital signs in last 24 hours: Vitals:   11/06/17 2300 11/06/17 2305 11/06/17 2335 11/07/17 0622  BP: (!) 162/98 (!) 161/96 (!) 150/93 (!) 154/84  Pulse: 63 65 68 73  Resp: 18 18 16 16   Temp:  98.5 F (36.9 C) 98.2 F (36.8 C) 98.3 F (36.8 C)  TempSrc:  Oral Oral Oral  SpO2:  99% 98% 96%  Weight:  120.9 kg (266 lb 8.6 oz) 120.9 kg (266 lb 8.6 oz)   Height:       Weight change: 1.2 kg (2 lb 10.3 oz)  Intake/Output Summary (Last 24 hours) at 11/07/2017 1439 Last data filed at 11/07/2017 1410 Gross per 24 hour  Intake 600 ml  Output 1000 ml  Net -400 ml       Labs: Basic Metabolic Panel: Recent Labs  Lab 11/02/17 1022 11/03/17 0633  11/05/17 0949  NA 139 138 138  K 3.8 3.7 3.7  CL 96* 96* 98*  CO2 20* 19* 24  GLUCOSE 88 85 93  BUN 143* 153* 103*  CREATININE 24.42* 23.66* 19.27*  CALCIUM 9.6 9.2 9.0  PHOS 11.6*  --  8.8*   Liver Function Tests: Recent Labs  Lab 11/02/17 1022 11/03/17 0633 11/03/17 2045 11/05/17 0949  AST 9* 6*  --   --   ALT 11* 8* 9*  --   ALKPHOS 41 38  --   --   BILITOT 1.1 1.0  --   --   PROT 7.2 6.7  --   --   ALBUMIN 4.3 4.0  --  3.7   No results for input(s): LIPASE, AMYLASE in the last 168 hours. No results for input(s): AMMONIA in the last 168 hours. CBC: Recent Labs  Lab 11/02/17 1022 11/03/17 0633 11/05/17 1012  WBC 5.2 5.3 2.7*  HGB 9.9* 9.6* 11.5*  HCT 31.9* 30.8* 36.3*  MCV 94.4 93.3 92.1  PLT 172 134* 71*   Cardiac Enzymes: No results for input(s): CKTOTAL, CKMB, CKMBINDEX, TROPONINI in the last 168 hours. CBG: Recent Labs  Lab 11/02/17 1221 11/02/17 1710 11/03/17 1136 11/03/17 1401 11/03/17 1719  GLUCAP 88 91 88 87 77    Iron Studies: No results for input(s): IRON, TIBC, TRANSFERRIN, FERRITIN in the last 72 hours. Studies/Results: No results found.  Medications: Infusions: . sodium  chloride    . sodium chloride    . sodium chloride    . sodium chloride      Scheduled Medications: . carvedilol  25 mg Oral BID  . Chlorhexidine Gluconate Cloth  6 each Topical Daily  . Chlorhexidine Gluconate Cloth  6 each Topical Q0600  . heparin injection (subcutaneous)  5,000 Units Subcutaneous Q8H  . hydrALAZINE  100 mg Oral TID  . lanthanum  1,000 mg Oral TID WC  . mupirocin ointment  1 application Nasal BID  . NIFEdipine  30 mg Oral Daily  . polyethylene glycol  17 g Oral BID  . simvastatin  20 mg Oral QHS    have reviewed scheduled and prn medications.  Physical Exam: General: Not in distress, comfortable Heart: Regular rate rhythm, S1-S2 normal Lungs: Bilateral, no crackle or wheeze. Abdomen:soft, Non-tender, non-distended Extremities: No  edema.   Dialysis Access: Right IJ TDC, left upper extremity AV fistula with positive thrill and bruit.  Janneth Krasner Prasad Rameen Quinney 11/07/2017,2:39 PM  LOS: 5 days

## 2017-11-08 DIAGNOSIS — N186 End stage renal disease: Secondary | ICD-10-CM | POA: Insufficient documentation

## 2017-11-08 DIAGNOSIS — I5032 Chronic diastolic (congestive) heart failure: Secondary | ICD-10-CM

## 2017-11-08 DIAGNOSIS — I10 Essential (primary) hypertension: Secondary | ICD-10-CM

## 2017-11-08 DIAGNOSIS — Z992 Dependence on renal dialysis: Secondary | ICD-10-CM

## 2017-11-08 LAB — RENAL FUNCTION PANEL
Albumin: 3.9 g/dL (ref 3.5–5.0)
Anion gap: 13 (ref 5–15)
BUN: 57 mg/dL — ABNORMAL HIGH (ref 6–20)
CO2: 26 mmol/L (ref 22–32)
Calcium: 9.2 mg/dL (ref 8.9–10.3)
Chloride: 95 mmol/L — ABNORMAL LOW (ref 98–111)
Creatinine, Ser: 13.66 mg/dL — ABNORMAL HIGH (ref 0.61–1.24)
GFR calc Af Amer: 4 mL/min — ABNORMAL LOW (ref >60)
GFR calc non Af Amer: 4 mL/min — ABNORMAL LOW (ref >60)
Glucose, Bld: 90 mg/dL (ref 70–99)
Phosphorus: 5.6 mg/dL — ABNORMAL HIGH (ref 2.5–4.6)
Potassium: 3.6 mmol/L (ref 3.5–5.1)
Sodium: 134 mmol/L — ABNORMAL LOW (ref 135–145)

## 2017-11-08 LAB — CBC
HCT: 32.2 % — ABNORMAL LOW (ref 39.0–52.0)
Hemoglobin: 9.9 g/dL — ABNORMAL LOW (ref 13.0–17.0)
MCH: 29 pg (ref 26.0–34.0)
MCHC: 30.7 g/dL (ref 30.0–36.0)
MCV: 94.4 fL (ref 78.0–100.0)
Platelets: 80 10*3/uL — ABNORMAL LOW (ref 150–400)
RBC: 3.41 MIL/uL — ABNORMAL LOW (ref 4.22–5.81)
RDW: 15.5 % (ref 11.5–15.5)
WBC: 6.6 10*3/uL (ref 4.0–10.5)

## 2017-11-08 LAB — IRON AND TIBC
Iron: 43 ug/dL — ABNORMAL LOW (ref 45–182)
Saturation Ratios: 18 % (ref 17.9–39.5)
TIBC: 234 ug/dL — ABNORMAL LOW (ref 250–450)
UIBC: 191 ug/dL

## 2017-11-08 LAB — FERRITIN: Ferritin: 151 ng/mL (ref 24–336)

## 2017-11-08 MED ORDER — LANTHANUM CARBONATE 1000 MG PO CHEW: 1000.0000 mg | CHEWABLE_TABLET | Freq: Three times a day (TID) | ORAL | 0 refills | Status: DC

## 2017-11-08 MED ORDER — POLYETHYLENE GLYCOL 3350 17 G PO PACK: 17.0000 g | PACK | Freq: Every day | ORAL | 0 refills | Status: DC | PRN

## 2017-11-08 NOTE — Progress Notes (Signed)
[facility] PROGRESS NOTE  Assessment/ Plan: Pt is a 47 y.o. yo male history of hypertension, CHF, type 2 diabetes, morbid obesity, CKD stage V admitted with generalized weakness, poor oral intake and worsening renal failure.  He is progressed to ESRD.  Assessment/Plan:  # ESRD/CKD stage V with uremic symptoms: Status post tunneled catheter and AV fistula creation on 11/03/17 and initiation of dialysis.  Dialysis today, tolerating well. -Patient has outpatient dialysis arranged at [facility] kidney center, first treatment tomorrow at 11:50 AM.  Discussed with the patient.  Patient is a stable to discharge today.  Discussed with the primary team.  # Anemia: Due to CKD: Iron saturation 18%, hemoglobin 9.9.  Start IV iron and ESA outpatient.  Patient is leaving today.  # Secondary hyperparathyroidism: On Phosrenol, phosphorus level trending down to 5.6.  PTH 169.7.  # HTN/volume: Blood pressure mildly elevated. Continue current medication.  Dialysis to help with volume management and blood pressure.   Subjective: Seen and examined at dialysis unit.  Tolerating dialysis well.  Denied nausea, vomiting, chest pain, shortness of breath.  No issue with access.  Objective Vital signs in last 24 hours: Vitals:   11/08/17 1015 11/08/17 1045 11/08/17 1115 11/08/17 1145  BP: 140/83 (!) 148/80 (!) 141/83 (!) 152/87  Pulse: 68 68 65 67  Resp:      Temp:      TempSrc:      SpO2:      Weight:      Height:       Weight change: -1 kg (-2 lb 3.3 oz)  Intake/Output Summary (Last 24 hours) at 11/08/2017 1156 Last data filed at 11/08/2017 0846 Gross per 24 hour  Intake 300 ml  Output 0 ml  Net 300 ml       Labs: Basic Metabolic Panel: Recent Labs  Lab 11/02/17 1022 11/03/17 0633 11/05/17 0949 11/08/17 0900  NA 139 138 138 134*  K 3.8 3.7 3.7 3.6  CL 96* 96* 98* 95*  CO2 20* 19* 24 26  GLUCOSE 88 85 93 90  BUN 143* 153* 103* 57*  CREATININE 24.42* 23.66*  19.27* 13.66*  CALCIUM 9.6 9.2 9.0 9.2  PHOS 11.6*  --  8.8* 5.6*   Liver Function Tests: Recent Labs  Lab 11/02/17 1022 11/03/17 0633 11/03/17 2045 11/05/17 0949 11/08/17 0900  AST 9* 6*  --   --   --   ALT 11* 8* 9*  --   --   ALKPHOS 41 38  --   --   --   BILITOT 1.1 1.0  --   --   --   PROT 7.2 6.7  --   --   --   ALBUMIN 4.3 4.0  --  3.7 3.9   No results for input(s): LIPASE, AMYLASE in the last 168 hours. No results for input(s): AMMONIA in the last 168 hours. CBC: Recent Labs  Lab 11/02/17 1022 11/03/17 0633 11/05/17 1012 11/08/17 0859  WBC 5.2 5.3 2.7* 6.6  HGB 9.9* 9.6* 11.5* 9.9*  HCT 31.9* 30.8* 36.3* 32.2*  MCV 94.4 93.3 92.1 94.4  PLT 172 134* 71* 80*   Cardiac Enzymes: No results for input(s): CKTOTAL, CKMB, CKMBINDEX, TROPONINI in the last 168 hours. CBG: Recent Labs  Lab 11/02/17 1221 11/02/17 1710 11/03/17 1136 11/03/17 1401 11/03/17 1719  GLUCAP 88 91 88 87 77    Iron Studies:  Recent Labs    11/08/17 0859  IRON 43*  TIBC 234*  FERRITIN  151   Studies/Results: No results found.  Medications: Infusions: . sodium chloride    . sodium chloride    . sodium chloride    . sodium chloride      Scheduled Medications: . carvedilol  25 mg Oral BID  . Chlorhexidine Gluconate Cloth  6 each Topical Daily  . Chlorhexidine Gluconate Cloth  6 each Topical Q0600  . heparin injection (subcutaneous)  5,000 Units Subcutaneous Q8H  . hydrALAZINE  100 mg Oral TID  . lanthanum  1,000 mg Oral TID WC  . mupirocin ointment  1 application Nasal BID  . NIFEdipine  30 mg Oral Daily  . polyethylene glycol  17 g Oral BID  . simvastatin  20 mg Oral QHS    have reviewed scheduled and prn medications.  Physical Exam: General: Not in distress, comfortable Heart: Regular rate rhythm, S1-S2 normal Lungs: Bilateral, no crackle or wheeze. Abdomen:soft, Non-tender, non-distended Extremities: No edema Dialysis Access: Right IJ TDC, left upper extremity AV  fistula with positive thrill and bruit.  Dron Prasad Bhandari 11/08/2017,11:56 AM  LOS: 6 days

## 2017-11-08 NOTE — Progress Notes (Signed)
Patient requires no assistance with home CPAP at this time, no distress noted RCP will continue to follow.

## 2017-11-08 NOTE — Progress Notes (Signed)
Discharge instructions and prescriptions given. Pt and wife verbalized understanding.  VSS. Denies pain at this time.  Pt left floor via wheelchair accompanied by staff and family.

## 2017-11-08 NOTE — Discharge Summary (Signed)
Physician Discharge Summary  Adrian Frye XBJ:[id number] DOB: Jan 17, 1971 DOA: 11/02/2017  PCP: Seward Carol, MD  Admit date: 11/02/2017 Discharge date: 11/08/2017  Admitted From: Home Disposition: Home  Recommendations for Outpatient Follow-up:  1. Follow up with schedule hemodialysis on Tuesdays, Thursdays and Saturdays at [facility] kidney center, first treatment will be on 6/27 at 11:50 AM.   Home Health: None Equipment/Devices: None  Discharge Condition: Fair CODE STATUS: Full code Diet recommendation: Heart Healthy /renal   Discharge Diagnoses:  Principal problems End-stage renal disease/CKD stage V with uremic symptoms  Active Problems:   Obstructive sleep apnea   Morbid obesity ([place])   Dilated idiopathic cardiomyopathy (HCC)   Chronic diastolic heart failure (HCC)   Acute prerenal azotemia   Uncontrolled hypertension/accelerated hypertension   Encounter for hemodialysis for ESRD [facility])  Brief narrative/HPI 47 year old morbidly obese male with history of chronic kidney disease stage V, uncontrolled hypertension, chronic diastolic CHF, OSA on CPAP, anemia of chronic disease, type 2 diabetes mellitus, asthma, presented after sent by primary care due to worsening creatinine and generalized weakness along with poor appetite.  Symptoms ongoing for past 7 days and gradually worsening.  In the ED patient was found to have accelerated hypertension, labs showing BUN of 143 and creatinine of 24 with GFR of 2. Patient admitted and started on hemodialysis.  Hospital course Acute prerenal azotemia/CKD stage V Tunneled HD catheter placed and AV fistula created on 6/29.  Patient started on dialysis and has been tolerating well.  Outpatient dialysis arranged at [facility] kidney center on 8 Tuesdays, Thursdays and Saturday schedule (first treatment will be tomorrow 6/27 at 11:50 AM.) Patient will be discharged after hemodialysis today.  Accelerated hypertension  Blood pressure  better controlled after starting dialysis.  Continue Coreg, hydralazine and nifedipine.  Will discontinue Lasix.  Anemia associated with chronic kidney disease  IV iron and ESA as outpatient.  Diabetes mellitus, controlled Recent A1c of 4.7.  CBG stable.  Not on home medications.  History of gout Colchicine discontinued.  Allopurinol as needed  Morbid obesity and OSA Nighttime CPAP.  Hyperlipidemia Continue statin.  Procedure: Tunneled dialysis catheter and AV fistula  Consults: Nephrology, vascular surgery Family communication: None at bedside Disposition: Home  Discharge Instructions   Allergies as of 11/08/2017      Reactions   Lisinopril Swelling   Angioedema   Losartan Swelling      Medication List    STOP taking these medications   colchicine 0.6 MG tablet   furosemide 80 MG tablet Commonly known as:  LASIX   potassium chloride SA 20 MEQ tablet Commonly known as:  K-DUR,KLOR-CON     TAKE these medications   allopurinol 300 MG tablet Commonly known as:  ZYLOPRIM Take 300 mg by mouth daily as needed (gout flare ups).   CAL-GEST ANTACID 500 MG chewable tablet Generic drug:  calcium carbonate Chew 1,000 mg by mouth 3 (three) times daily with meals.   calcitRIOL 0.25 MCG capsule Commonly known as:  ROCALTROL Take 0.5 mcg by mouth daily after lunch.   carvedilol 25 MG tablet Commonly known as:  COREG Take 25 mg by mouth 2 (two) times daily.   epoetin alfa 20000 UNIT/ML injection Commonly known as:  EPOGEN,PROCRIT Inject 40,000 Units into the skin as directed.   hydrALAZINE 100 MG tablet Commonly known as:  APRESOLINE TAKE 1 TABLET (100 MG TOTAL) BY MOUTH 3 (THREE) TIMES DAILY.   lanthanum 1000 MG chewable tablet Commonly known as:  FOSRENOL Chew 1 tablet (  1,000 mg total) by mouth 3 (three) times daily with meals.   multivitamin with minerals Tabs tablet Take 1 tablet by mouth daily. "PhytoMulti" from [employer]   NIFEdipine 30 MG 24 hr  tablet Commonly known as:  PROCARDIA-XL/ADALAT-CC/NIFEDICAL-XL Take 30 mg by mouth daily.   polyethylene glycol packet Commonly known as:  MIRALAX / GLYCOLAX Take 17 g by mouth daily as needed.   PRESCRIPTION MEDICATION Inhale into the lungs at bedtime. CPAP   simvastatin 20 MG tablet Commonly known as:  ZOCOR Take 20 mg by mouth at bedtime.   zolpidem 10 MG tablet Commonly known as:  AMBIEN Take 1 tablet (10 mg total) by mouth at bedtime as needed for sleep. What changed:  when to take this      Follow-up Information    Early, Arvilla Meres, MD In 6 weeks.   Specialties:  Vascular Surgery, Cardiology Why:  Office will call you to arrange your appt (sent) Contact information: [place] [address] [phone]        [facility] for HD on tu,th sat,first appt on 6/27 Follow up.          Allergies  Allergen Reactions  . Lisinopril Swelling    Angioedema   . Losartan Swelling      Procedures/Studies: Dg Chest Port 1 View  Result Date: 11/03/2017 CLINICAL DATA:  Status post dialysis catheter insertion. EXAM: PORTABLE CHEST 1 VIEW COMPARISON:  07/01/2016 FINDINGS: A right jugular dialysis catheter has been placed and terminates over the lower SVC. The cardiac silhouette is mildly enlarged. There is central pulmonary vascular congestion without overt edema. Streaky opacities are present in both lung bases, and there is a small left pleural effusion. No pneumothorax is identified. No acute osseous abnormality is seen. IMPRESSION: 1. Right jugular dialysis catheter as above.  No pneumothorax. 2. Mild pulmonary vascular congestion. 3. Bibasilar atelectasis or pneumonia.  Small left pleural effusion. Electronically Signed   By: Logan Bores M.D.   On: 11/03/2017 14:50   Dg Fluoro Guide Cv Line-no Report  Result Date: 11/03/2017 Fluoroscopy was utilized by the requesting physician.  No radiographic interpretation.       Subjective: During dialysis.  No overnight  events.  Denies any symptoms.  Discharge Exam: Vitals:   11/08/17 1145 11/08/17 1215  BP: (!) 152/87 (!) 145/81  Pulse: 67 67  Resp:    Temp:    SpO2:     Vitals:   11/08/17 1045 11/08/17 1115 11/08/17 1145 11/08/17 1215  BP: (!) 148/80 (!) 141/83 (!) 152/87 (!) 145/81  Pulse: 68 65 67 67  Resp:      Temp:      TempSrc:      SpO2:      Weight:      Height:        General: Not in distress HEENT: Moist mucosa, supple neck Chest: Tunneled HD catheter, clear to auscultation bilaterally CVS: Normal S1 and S2, no murmurs GI: Soft, nondistended, nontender Musculoskeletal: Warm, no edema    The results of significant diagnostics from this hospitalization (including imaging, microbiology, ancillary and laboratory) are listed below for reference.     Microbiology: Recent Results (from the past 240 hour(s))  Surgical pcr screen     Status: Abnormal   Collection Time: 11/03/17  7:27 AM  Result Value Ref Range Status   MRSA, PCR NEGATIVE NEGATIVE Final   Staphylococcus aureus POSITIVE (A) NEGATIVE Final    Comment: (NOTE) The Xpert SA Assay (FDA  approved for NASAL specimens in patients 50 years of age and older), is one component of a comprehensive surveillance program. It is not intended to diagnose infection nor to guide or monitor treatment. Performed at [facility], [place] [address] [place], [place] [address]      Labs: BNP (last 3 results) No results for input(s): BNP in the last 8760 hours. Basic Metabolic Panel: Recent Labs  Lab 11/02/17 1022 11/03/17 0633 11/05/17 0949 11/08/17 0900  NA 139 138 138 134*  K 3.8 3.7 3.7 3.6  CL 96* 96* 98* 95*  CO2 20* 19* 24 26  GLUCOSE 88 85 93 90  BUN 143* 153* 103* 57*  CREATININE 24.42* 23.66* 19.27* 13.66*  CALCIUM 9.6 9.2 9.0 9.2  MG 3.3*  --   --   --   PHOS 11.6*  --  8.8* 5.6*   Liver Function Tests: Recent Labs  Lab 11/02/17 1022 11/03/17 0633 11/03/17 2045 11/05/17 0949 11/08/17 0900  AST 9*  6*  --   --   --   ALT 11* 8* 9*  --   --   ALKPHOS 41 38  --   --   --   BILITOT 1.1 1.0  --   --   --   PROT 7.2 6.7  --   --   --   ALBUMIN 4.3 4.0  --  3.7 3.9   No results for input(s): LIPASE, AMYLASE in the last 168 hours. No results for input(s): AMMONIA in the last 168 hours. CBC: Recent Labs  Lab 11/02/17 1022 11/03/17 0633 11/05/17 1012 11/08/17 0859  WBC 5.2 5.3 2.7* 6.6  HGB 9.9* 9.6* 11.5* 9.9*  HCT 31.9* 30.8* 36.3* 32.2*  MCV 94.4 93.3 92.1 94.4  PLT 172 134* 71* 80*   Cardiac Enzymes: No results for input(s): CKTOTAL, CKMB, CKMBINDEX, TROPONINI in the last 168 hours. BNP: Invalid input(s): POCBNP CBG: Recent Labs  Lab 11/02/17 1221 11/02/17 1710 11/03/17 1136 11/03/17 1401 11/03/17 1719  GLUCAP 88 91 88 87 77   D-Dimer No results for input(s): DDIMER in the last 72 hours. Hgb A1c No results for input(s): HGBA1C in the last 72 hours. Lipid Profile No results for input(s): CHOL, HDL, LDLCALC, TRIG, CHOLHDL, LDLDIRECT in the last 72 hours. Thyroid function studies No results for input(s): TSH, T4TOTAL, T3FREE, THYROIDAB in the last 72 hours.  Invalid input(s): FREET3 Anemia work up Recent Labs    11/08/17 0859  FERRITIN 151  TIBC 234*  IRON 43*   Urinalysis    Component Value Date/Time   COLORURINE STRAW (A) 11/02/2017 2208   APPEARANCEUR CLEAR 11/02/2017 2208   LABSPEC 1.011 11/02/2017 2208   PHURINE 5.0 11/02/2017 2208   GLUCOSEU 50 (A) 11/02/2017 2208   HGBUR NEGATIVE 11/02/2017 2208   BILIRUBINUR NEGATIVE 11/02/2017 2208   KETONESUR 5 (A) 11/02/2017 2208   PROTEINUR 100 (A) 11/02/2017 2208   UROBILINOGEN 0.2 09/15/2009 2240   NITRITE NEGATIVE 11/02/2017 2208   LEUKOCYTESUR NEGATIVE 11/02/2017 2208   Sepsis Labs Invalid input(s): PROCALCITONIN,  WBC,  LACTICIDVEN Microbiology Recent Results (from the past 240 hour(s))  Surgical pcr screen     Status: Abnormal   Collection Time: 11/03/17  7:27 AM  Result Value Ref Range  Status   MRSA, PCR NEGATIVE NEGATIVE Final   Staphylococcus aureus POSITIVE (A) NEGATIVE Final    Comment: (NOTE) The Xpert SA Assay (FDA approved for NASAL specimens in patients 78 years of age and older), is one component  of a comprehensive surveillance program. It is not intended to diagnose infection nor to guide or monitor treatment. Performed at [facility], [place] [address] [place], [place] [address]      Time coordinating discharge: 35 minutes  SIGNED:   Louellen Molder, MD  [facility] 11/08/2017, 12:33 PM Pager   If 7PM-7AM, please contact night-coverage [email or web address] Password [email or web address]

## 2017-11-08 NOTE — Progress Notes (Signed)
Pt gone to dialysis via bed. Pt alert and oriented.  Denies pain at this time.

## 2017-11-09 DIAGNOSIS — R52 Pain, unspecified: Secondary | ICD-10-CM | POA: Insufficient documentation

## 2017-11-09 DIAGNOSIS — R509 Fever, unspecified: Secondary | ICD-10-CM | POA: Insufficient documentation

## 2017-11-09 DIAGNOSIS — D509 Iron deficiency anemia, unspecified: Secondary | ICD-10-CM | POA: Insufficient documentation

## 2017-11-09 DIAGNOSIS — D689 Coagulation defect, unspecified: Secondary | ICD-10-CM | POA: Insufficient documentation

## 2017-11-09 DIAGNOSIS — Z111 Encounter for screening for respiratory tuberculosis: Secondary | ICD-10-CM | POA: Insufficient documentation

## 2017-11-09 DIAGNOSIS — L299 Pruritus, unspecified: Secondary | ICD-10-CM | POA: Insufficient documentation

## 2017-11-09 DIAGNOSIS — T829XXA Unspecified complication of cardiac and vascular prosthetic device, implant and graft, initial encounter: Secondary | ICD-10-CM | POA: Insufficient documentation

## 2017-11-14 ENCOUNTER — Ambulatory Visit: Payer: BLUE CROSS/BLUE SHIELD | Admitting: Family Medicine

## 2017-11-21 DIAGNOSIS — Z23 Encounter for immunization: Secondary | ICD-10-CM | POA: Insufficient documentation

## 2017-11-24 ENCOUNTER — Encounter: Payer: Self-pay | Admitting: Family Medicine

## 2017-11-24 ENCOUNTER — Ambulatory Visit: Payer: BC Managed Care – PPO | Admitting: Family Medicine

## 2017-11-24 VITALS — BP 90/68 | HR 98 | Temp 99.1°F | Ht 66.0 in | Wt 261.0 lb

## 2017-11-24 DIAGNOSIS — N186 End stage renal disease: Secondary | ICD-10-CM

## 2017-11-24 DIAGNOSIS — Z992 Dependence on renal dialysis: Secondary | ICD-10-CM

## 2017-11-24 DIAGNOSIS — I10 Essential (primary) hypertension: Secondary | ICD-10-CM

## 2017-11-24 DIAGNOSIS — I5032 Chronic diastolic (congestive) heart failure: Secondary | ICD-10-CM | POA: Diagnosis not present

## 2017-11-24 NOTE — Progress Notes (Signed)
Subjective:    Patient ID: Adrian Frye, male    DOB: 03-27-1971, 47 y.o.   MRN: [id number]  No chief complaint on file.   HPI Patient was seen today for f/u.  Pt recently hospitalized 6/20-6/26 for acute pre-renal azotemia and CKD stage V (BUN 143/creat 24).  Tunneled catheter and AV fistula placed 6/29.  Pt started on HD.    Since d/c, pt doing well.  Has HD at [facility] kidney center T, Th, Sa.  Pt states he is making some urine.  He is on 40 oz fluid restriction.  Taking Fosrenol 1000 mg chewable tablet.    Pt states he stopped most of his meds.  Taking fosrenol, ambien, and procardia xl 30 mg.  Pt states he has a PCP, Dr. Delfina Redwood.  Pt states he wants someone to explain why he has HTN and "fix it".  Pt endorses losing 140 lbs.  Pt states he does not think he needs anything for his bp as it is too low.  Pt endorses feeling dizzy when it is in the 27O systolic.  Pt states he has not had any issues with CHF for yrs.  Past Medical History:  Diagnosis Date  . Allergic rhinitis   . Chronic kidney disease    stage III-IV  . Diabetes mellitus without complication ([place])   . Dilated idiopathic cardiomyopathy ([place])    now resoved with EF 55% by echo 2011  . Edema   . Encounter for blood transfusion   . Hyperlipidemia   . Hyperparathyroidism, secondary ([place])   . Hypertension   . Morbid obesity ([place])   . Sleep apnea   . UGI bleed 2011   ASA    Allergies  Allergen Reactions  . Lisinopril Swelling    Angioedema   . Losartan Swelling    ROS General: Denies fever, chills, night sweats, changes in weight, changes in appetite +dizziness HEENT: Denies headaches, ear pain, changes in vision, rhinorrhea, sore throat CV: Denies CP, palpitations, SOB, orthopnea Pulm: Denies SOB, cough, wheezing GI: Denies abdominal pain, nausea, vomiting, diarrhea, constipation GU: Denies dysuria, hematuria, frequency, vaginal discharge Msk: Denies muscle cramps, joint pains Neuro: Denies weakness,  numbness, tingling Skin: Denies rashes, bruising Psych: Denies depression, anxiety, hallucinations     Objective:    Blood pressure 90/68, pulse 98, temperature 99.1 F (37.3 C), temperature source Oral, height 5\' 6"  (1.676 m), weight 261 lb (118.4 kg), SpO2 97 %.   Gen. Pleasant, well-nourished, in no distress, normal affect   HEENT: [place]/AT, face symmetric, no scleral icterus, PERRLA, nares patent without drainage Lungs: no accessory muscle use, CTAB, no wheezes or rales Cardiovascular: RRR, no m/r/g, no peripheral edema. Catheter in R upper chest Abdomen: BS present, soft, NT/ND Neuro:  A&Ox3, CN II-XII intact, normal gait Skin:  Warm, no lesions/ rash.  Catheter in R upper chest, c/d/i.   Wt Readings from Last 3 Encounters:  11/24/17 261 lb (118.4 kg)  11/08/17 261 lb 0.4 oz (118.4 kg)  01/18/17 (!) 316 lb 1.9 oz (143.4 kg)    Assessment/Plan:  Essential hypertension -controlled on HD and procardia XL 30 mg -attempted to explain various causes of HTN including diet, genetics (pt's mother and sister have HTN), etc, but pt continued to state he wanted to know what was causing his HTN. -Too much vol may be taken off at HD in addition to the procardia causing hypotension. -consider decreasing procardia or amount of vol taken at HD -no adjustments made as per pt  he "has a pcp"  Chronic diastolic heart failure ([place])  ESRD on hemodialysis ([place]) -continue fosrenol 1000 mg -continue renal diet and 40 oz fluid restrictions -continue HD on T, Th, Sa   F/u with pcp  Grier Mitts, MD

## 2017-11-24 NOTE — Patient Instructions (Signed)
Cardiomyopathy, Adult Cardiomyopathy is a long-term (chronic) disease of the heart muscle. The disease makes the heart muscle thick, weak, or stiff. As a result, the heart works harder to pump blood. Over time, cardiomyopathy can lead to heart failure. There are several types of cardiomyopathy:  Dilated cardiomyopathy. This type causes the ventricles to become weak and stretched out.  Hypertrophic cardiomyopathy. This type causes the heart muscle to thicken.  Restrictive cardiomyopathy. This type causes the heart muscle to become stiff.  Ischemic cardiomyopathy. This type involves narrowing arteries that cause the walls of the heart to get thinner.  Peripartum cardiomyopathy. This type occurs during pregnancy or shortly after pregnancy.  What are the causes? This condition may be caused by:  A gene that is passed down (inherited) from a family member.  A medical condition that damages the heart, such as: ? Diabetes. ? High blood pressure. ? Viral infection of the heart. ? Heart attack. ? Coronary heart disease.  Alcoholism.  Using illegal drugs or some prescription medicines.  Pregnancy.  Your body absorbing and storing too much iron (hemochromatosis).  Autoimmune diseases, connective tissue diseases, endocrine diseases, and muscle diseases.  Cancer treatments.  Buildup of proteins in your organs (amyloidosis), or inflammation in your organs (sarcoidosis).  Often, the cause is not known. What increases the risk? This condition is more likely to develop in people who:  Have a family history of cardiomyopathy or other heart problems.  Are overweight or obese.  Use illegal drugs.  Abuse alcohol.  Have a medical condition that damages the heart.  What are the signs or symptoms? Symptoms of this condition include:  Shortness of breath, especially during activity.  Fatigue.  An irregular heartbeat and heart  murmurs.  Dizziness.  Light-headedness.  Fainting.  Chest pain.  Coughing.  Swelling in the lower legs, ankles, feet, abdomen, and neck veins.  Often, people with this condition have no symptoms. How is this diagnosed? This condition is diagnosed based on:  Your symptoms and medical history.  A physical exam.  Tests.  Tests may include:  Blood tests.  Imaging studies of your heart, such as: ? X-rays. ? An echocardiogram. ? An MRI.  An electrocardiogram (ECG). This records your heart's electrical activity.  A test in which you wear a portable device (event monitor) to record your heart's electrical activity while you go about your day.  A stress test. This monitors your heart's activity while exercising.  Cardiac catheterization. This procedure checks the blood pressure and blood flow in your heart.  An angiogram. This is an injection of dye into your arteries before imaging studies are taken.  Heart tissue biopsy. This removes a sample of heart tissue for examination.  How is this treated?  Treatment for this condition depends on the type of cardiomyopathy you have and the severity of your symptoms. If you do not have symptoms, you may not need treatment. If you need treatment, it may include:  Lifestyle changes, such as: ? Eating a heart-healthy diet that includes plenty of fruits, vegetables, and whole grains, and cutting down on salt (sodium). ? Maintaining a healthy weight, and losing weight, if needed. ? Getting regular exercise. ? Quitting smoking, if you smoke. ? Avoiding alcohol. ? Medicine to:  Lower your blood pressure.  Slow down your heart rate.  Keep your heart beating in a steady rhythm.  Clear excess fluids from your body.  Prevent blood clots.  Balance minerals (electrolytes) in your body and get rid of extra  sodium in your body.  Reduce inflammation.  Strengthen your heartbeat. ? Surgery to:  Repair a defect.  Remove  thickened tissue.  Destroy tissues in the area of abnormal electrical activity (ablation).  Implant a device to treat serious heart rhythm problems (implantable cardioverter-defibrillator, or ICD), or a pacemaker.  Replace your heart (heart transplant) if all other treatments have failed (end stage).  Other treatments may include cardiac resynchronization therapy (CRT) or a left ventricular assist device (LVAD). Follow these instructions at home: Lifestyle  Eat a heart-healthy diet. Work with your health care provider or a registered dietitian to learn about healthy eating options.  Maintain a healthy weight.  Stay physically active. Ask your health care provider to suggest some activities that are good for you.  Do not use any products that contain nicotine or tobacco, such as cigarettes and e-cigarettes. If you need help quitting, ask your health care provider.  Limit alcohol intake to no more than one drink per day for nonpregnant women and no more than two drinks per day for men. One drink equals 12 oz of beer, 5 oz of wine, or 1 oz of hard liquor.  Try to get at least 7 hours of sleep each night.  Find healthy ways to manage stress. General instructions  Take over-the-counter and prescription medicines only as told by your health care provider. Some medicines can be dangerous for your heart.  Tell all health care providers, including your dentist, that you have cardiomyopathy. When you visit the dentist or have surgery, ask your health care provider if you need antibiotics before having dental care or before the surgery.  Ask your health care provider if you should wear a medical identification bracelet. This may be important if you have a pacemaker or a defibrillator.  Make sure you get all recommended vaccinations and an annual flu shot.  Work closely with your health care provider to manage any long-lasting (chronic) conditions.  Keep all follow-up visits as told by your  health care provider. This is important, even if you do not have any symptoms. Your health care provider may need to make sure your condition is not getting worse. How is this prevented? This condition cannot be prevented. Parents, siblings, and children of people with this condition may be at risk for the condition. It is a good idea for them to get screened for the condition because it is best when cardiomyopathy is found early. Screening is done with an ECG and echocardiogram. People who want to start a family may also want to meet with a genetic counselor to discuss the risk of having a child with cardiomyopathy. Contact a health care provider if:  Your symptoms get worse.  You have new symptoms. Get help right away if:  You have severe chest pain.  You have shortness of breath.  You cough up pink, bubbly material.  You have sudden sweating.  You feel nauseous and you vomit.  You suddenly become light-headed or dizzy.  You feel your heart beating very quickly.  It feels like your heart is skipping beats. These symptoms may represent a serious problem that is an emergency. Do not wait to see if the symptoms will go away. Get medical help right away. Call your local emergency services (911 in the [place] Do not drive yourself to the hospital. This information is not intended to replace advice given to you by your health care provider. Make sure you discuss any questions you have with your health care  provider. Document Released: 07/15/2004 Document Revised: 12/29/2015 Document Reviewed: 11/02/2015 Elsevier Interactive Patient Education  2018 [employer].  Chronic Kidney Disease, Adult Chronic kidney disease (CKD) occurs when the kidneys become damaged slowly over a long period of time. The kidneys are a pair of organs that do many important jobs in the body, including:  Removing waste and extra fluid from the blood to make urine.  Making hormones that maintain the amount of  fluid in tissues and blood vessels.  Maintaining the right amount of fluids and chemicals in the body.  A small amount of kidney damage may not cause problems, but a large amount of damage may make it hard or impossible for the kidneys to work the way they should. If steps are not taken to slow down kidney damage or to stop it from getting worse, the kidneys may stop working permanently (end-stage renal disease or ESRD). Most of the time, CKD does not go away, but it can often be controlled. People who have CKD are usually able to live normal lives. What are the causes? The most common causes of this condition are diabetes and high blood pressure (hypertension). Other causes include:  Heart and blood vessel (cardiovascular) disease.  Kidney diseases, such as: ? Glomerulonephritis. ? Interstitial nephritis. ? Polycystic kidney disease. ? Renal vascular disease.  Diseases that affect the immune system.  Genetic diseases.  Medicines that damage the kidneys, such as anti-inflammatory medicines.  Being around or being in contact with poisonous (toxic) substances.  A kidney or urinary infection that occurs again and again (recurs).  Vasculitis. This is swelling or inflammation of the blood vessels.  A problem with urine flow that may be caused by: ? Cancer. ? Having kidney stones more than one time. ? An enlarged prostate, in males.  What increases the risk? You are more likely to develop this condition if you:  Are older than age 5.  Are male.  Are African-American, Hispanic, Asian, [place], or American [place].  Are a current or former smoker.  Are obese.  Have a family history of kidney disease or failure.  Often take medicines that are damaging to the kidneys.  What are the signs or symptoms? Symptoms of this condition include:  Swelling (edema) of the face, legs, ankles, or feet.  Tiredness (lethargy) and having less energy.  Nausea or  vomiting.  Confusion or trouble concentrating.  Problems with urination, such as: ? Painful or burning feeling during urination. ? Decreased urine production. ? Frequent urination, especially at night. ? Bloody urine.  Muscle twitches and cramps, especially in the legs.  Shortness of breath.  Weakness.  Loss of appetite.  Metallic taste in the mouth.  Trouble sleeping.  Dry, itchy skin.  A low blood count (anemia).  Pale lining of the eyelids and surface of the eye (conjunctiva).  Symptoms develop slowly and may not be obvious until the kidney damage becomes severe. It is possible to have kidney disease for years without having any symptoms. How is this diagnosed? This condition may be diagnosed based on:  Blood tests.  Urine tests.  Imaging tests, such as an ultrasound or CT scan.  A test in which a sample of tissue is removed from the kidneys to be examined under a microscope (kidney biopsy).  These test results will help your health care provider determine how serious the CKD is. How is this treated? There is no cure for most cases of this condition, but treatment usually relieves symptoms and  prevents or slows the progression of the disease. Treatment may include:  Making diet changes, which may require you to avoid alcohol, salty foods (sodium), and foods that are high in potassium, calcium, and protein.  Medicines: ? To lower blood pressure. ? To control blood glucose. ? To relieve anemia. ? To relieve swelling. ? To protect your bones. ? To improve the balance of electrolytes in your blood.  Removing toxic waste from the body through types of dialysis, if the kidneys can no longer do their job (kidney failure).  Managing any other conditions that are causing your CKD or making it worse.  Follow these instructions at home: Medicines  Take over-the-counter and prescription medicines only as told by your health care provider. The dose of some medicines  that you take may need to be adjusted.  Do not take any new medicines unless approved by your health care provider. Many medicines can worsen your kidney damage.  Do not take any vitamin and mineral supplements unless approved by your health care provider. Many nutritional supplements can worsen your kidney damage. General instructions  Follow your prescribed diet as told by your health care provider.  Do not use any products that contain nicotine or tobacco, such as cigarettes and e-cigarettes. If you need help quitting, ask your health care provider.  Monitor and track your blood pressure at home. Report changes in your blood pressure as told by your health care provider.  If you are being treated for diabetes, monitor and track your blood sugar (blood glucose) levels as told by your health care provider.  Maintain a healthy weight. If you need help with this, ask your health care provider.  Start or continue an exercise plan. Exercise at least 30 minutes a day, 5 days a week.  Keep your immunizations up to date as told by your health care provider.  Keep all follow-up visits as told by your health care provider. This is important. Where to find more information:  American Association of Kidney Patients: BombTimer.gl  National Kidney Foundation: www.kidney.[place]: https://mathis.com/  [facility]: www.lifeoptions.org and www.kidneyschool.org Contact a health care provider if:  Your symptoms get worse.  You develop new symptoms. Get help right away if:  You develop symptoms of ESRD, which include: ? Headaches. ? Numbness in the hands or feet. ? Easy bruising. ? Frequent hiccups. ? Chest pain. ? Shortness of breath. ? Lack of menstruation, in women.  You have a fever.  You have decreased urine production.  You have pain or bleeding when you urinate. Summary  Chronic kidney disease (CKD) occurs when the kidneys become damaged  slowly over a long period of time.  The most common causes of this condition are diabetes and high blood pressure (hypertension).  There is no cure for most cases of this condition, but treatment usually relieves symptoms and prevents or slows the progression of the disease. Treatment may include a combination of medicines and lifestyle changes. This information is not intended to replace advice given to you by your health care provider. Make sure you discuss any questions you have with your health care provider. Document Released: 02/09/2008 Document Revised: 06/09/2016 Document Reviewed: 06/09/2016 Elsevier Interactive Patient Education  2018 [place] for Chronic Kidney Disease When your kidneys are not working well, they cannot remove waste and excess substances from your blood as effectively as they did before. This can lead to a buildup and imbalance of these substances, which  can worsen kidney damage and affect how your body functions. Certain foods lead to a buildup of these substances in the body. By changing your diet as recommended by your diet and nutrition specialist (dietitian) or health care provider, you could help prevent further kidney damage and delay or prevent the need for dialysis. What are tips for following this plan? General instructions  Work with your health care provider and dietitian to develop a meal plan that is right for you. Foods you can eat, limit, or avoid will be different for each person depending on the stage of kidney disease and any other existing health conditions.  Talk with your health care provider about whether you should take a vitamin and mineral supplement.  Use standard measuring cups and spoons to measure servings of foods. Use a kitchen scale to measure portions of protein foods.  If directed by your health care provider, avoid drinking too much fluid. Measure and count all liquids, including water, ice, soups, flavored  gelatin, and frozen desserts such as popsicles or ice cream. Reading food labels  Check the amount of sodium in foods. Choose foods that have less than 300 milligrams (mg) per serving.  Check the ingredient list for phosphorus or potassium-based additives or preservatives.  Check the amount of saturated and trans fat. Limit or avoid these fats as told by your dietitian. Shopping  Avoid buying foods that are: ? Processed, frozen, or prepackaged. ? Calcium-enriched or fortified.  Do not buy foods that have salt or sodium listed among the first five ingredients.  Do not buy canned vegetables. Cooking  Replace animal proteins, such as meat, fish, eggs, or dairy, with plant proteins from beans, nuts, and soy. ? Use soy milk instead of cow's milk. ? Add beans or tofu to soups, casseroles, or pasta dishes instead of meat.  Soak vegetables, such as potatoes, before cooking to reduce potassium. To do this: ? Peel and cut into small pieces. ? Soak in warm water for at least 2 hours. For every 1 cup of vegetables, use 10 cups of water. ? Drain and rinse with warm water. ? Boil for at least 5 minutes. Meal planning  Limit the amount of protein from plant and animal sources you eat each day.  Do not add salt to food when cooking or before eating.  Eat meals and snacks at around the same time each day. If you have diabetes:  If you have diabetes (diabetes mellitus) and chronic kidney disease, it is important to keep your blood glucose in the target range recommended by your health care provider. Follow your diabetes management plan. This may include: ? Checking your blood glucose regularly. ? Taking oral medicines, insulin, or both. ? Exercising for at least 30 minutes on 5 or more days each week, or as told by your health care provider. ? Tracking how many servings of carbohydrates you eat at each meal.  You may be given specific guidelines on how much of certain foods and nutrients you  may eat, depending on your stage of kidney disease and whether you have high blood pressure (hypertension). Follow your meal plan as told by your dietitian. What nutrients should be limited? The items listed are not a complete list. Talk with your dietitian about what dietary choices are best for you. Potassium Potassium affects how steadily your heart beats. If too much potassium builds up in your blood, it can cause an irregular heartbeat or even a heart attack. You may need to eat  less potassium, depending on your blood potassium levels and the stage of kidney disease. Talk to your dietitian about how much potassium you may have each day. You may need to limit or avoid foods that are high in potassium, such as:  Milk and soy milk.  Fruits, such as bananas, papaya, apricots, nectarines, melon, prunes, raisins, kiwi, and oranges.  Vegetables, such as potatoes, sweet potatoes, yams, tomatoes, leafy greens, beets, okra, avocado, pumpkin, and winter squash.  White and lima beans.  Phosphorus Phosphorus is a mineral found in your bones. A balance between calcium and phosphorous is needed to build and maintain healthy bones. Too much phosphorus pulls calcium from your bones. This can make your bones weak and more likely to break. Too much phosphorus can also make your skin itch. You may need to eat less phosphorus depending on your blood phosphorus levels and the stage of kidney disease. Talk to your dietitian about how much potassium you may have each day. You may need to take medicine to lower your blood phosphorus levels if diet changes do not help. You may need to limit or avoid foods that are high in phosphorus, such as:  Milk and dairy products.  Dried beans and peas.  Tofu, soy milk, and other soy-based meat replacements.  Colas.  Nuts and peanut butter.  Meat, poultry, and fish.  Bran cereals and oatmeals.  Protein Protein helps you to make and keep muscle. It also helps in  the repair of your body's cells and tissues. One of the natural breakdown products of protein is a waste product called urea. When your kidneys are not working properly, they cannot remove wastes, such as urea, like they did before you developed chronic kidney disease. Reducing how much protein you eat can help prevent a buildup of urea in your blood. Depending on your stage of kidney disease, you may need to limit foods that are high in protein. Sources of animal protein include:  Meat (all types).  Fish and seafood.  Poultry.  Eggs.  Dairy.  Other protein foods include:  Beans and legumes.  Nuts and nut butter.  Soy and tofu.  Sodium Sodium, which is found in salt, helps maintain a healthy balance of fluids in your body. Too much sodium can increase your blood pressure and have a negative effect on the function of your heart and lungs. Too much sodium can also cause your body to retain too much fluid, making your kidneys work harder. Most people should have less than 2,300 milligrams (mg) of sodium each day. If you have hypertension, you may need to limit your sodium to 1,500 mg each day. Talk to your dietitian about how much sodium you may have each day. You may need to limit or avoid foods that are high in sodium, such as:  Salt seasonings.  Soy sauce.  Cured and processed meats.  Salted crackers and snack foods.  Fast food.  Canned soups and most canned foods.  Pickled foods.  Vegetable juice.  Boxed mixes or ready-to-eat boxed meals and side dishes.  Bottled dressings, sauces, and marinades.  Summary  Chronic kidney disease can lead to a buildup and imbalance of waste and excess substances in the body. Certain foods lead to a buildup of these substances. By adjusting your intake of these foods, you could help prevent more kidney damage and delay or prevent the need for dialysis.  Food adjustments are different for each person with chronic kidney disease. Work  with a Microbiologist  to set up nutrient goals and a meal plan that is right for you.  If you have diabetes and chronic kidney disease, it is important to keep your blood glucose in the target range recommended by your health care provider. This information is not intended to replace advice given to you by your health care provider. Make sure you discuss any questions you have with your health care provider. Document Released: 07/23/2002 Document Revised: 04/27/2016 Document Reviewed: 04/27/2016 Elsevier Interactive Patient Education  2018 [employer].  Managing Your Hypertension Hypertension is commonly called high blood pressure. This is when the force of your blood pressing against the walls of your arteries is too strong. Arteries are blood vessels that carry blood from your heart throughout your body. Hypertension forces the heart to work harder to pump blood, and may cause the arteries to become narrow or stiff. Having untreated or uncontrolled hypertension can cause heart attack, stroke, kidney disease, and other problems. What are blood pressure readings? A blood pressure reading consists of a higher number over a lower number. Ideally, your blood pressure should be below 120/80. The first ("top") number is called the systolic pressure. It is a measure of the pressure in your arteries as your heart beats. The second ("bottom") number is called the diastolic pressure. It is a measure of the pressure in your arteries as the heart relaxes. What does my blood pressure reading mean? Blood pressure is classified into four stages. Based on your blood pressure reading, your health care provider may use the following stages to determine what type of treatment you need, if any. Systolic pressure and diastolic pressure are measured in a unit called mm Hg. Normal  Systolic pressure: below 409.  Diastolic pressure: below 80. Elevated  Systolic pressure: 735-329.  Diastolic pressure: below  80. Hypertension stage 1  Systolic pressure: 924-268.  Diastolic pressure: 34-19. Hypertension stage 2  Systolic pressure: 622 or above.  Diastolic pressure: 90 or above. What health risks are associated with hypertension? Managing your hypertension is an important responsibility. Uncontrolled hypertension can lead to:  A heart attack.  A stroke.  A weakened blood vessel (aneurysm).  Heart failure.  Kidney damage.  Eye damage.  Metabolic syndrome.  Memory and concentration problems.  What changes can I make to manage my hypertension? Hypertension can be managed by making lifestyle changes and possibly by taking medicines. Your health care provider will help you make a plan to bring your blood pressure within a normal range. Eating and drinking  Eat a diet that is high in fiber and potassium, and low in salt (sodium), added sugar, and fat. An example eating plan is called the DASH (Dietary Approaches to Stop Hypertension) diet. To eat this way: ? Eat plenty of fresh fruits and vegetables. Try to fill half of your plate at each meal with fruits and vegetables. ? Eat whole grains, such as whole wheat pasta, brown rice, or whole grain bread. Fill about one quarter of your plate with whole grains. ? Eat low-fat diary products. ? Avoid fatty cuts of meat, processed or cured meats, and poultry with skin. Fill about one quarter of your plate with lean proteins such as fish, chicken without skin, beans, eggs, and tofu. ? Avoid premade and processed foods. These tend to be higher in sodium, added sugar, and fat.  Reduce your daily sodium intake. Most people with hypertension should eat less than 1,500 mg of sodium a day.  Limit alcohol intake to no more than 1  drink a day for nonpregnant women and 2 drinks a day for men. One drink equals 12 oz of beer, 5 oz of wine, or 1 oz of hard liquor. Lifestyle  Work with your health care provider to maintain a healthy body weight, or to  lose weight. Ask what an ideal weight is for you.  Get at least 30 minutes of exercise that causes your heart to beat faster (aerobic exercise) most days of the week. Activities may include walking, swimming, or biking.  Include exercise to strengthen your muscles (resistance exercise), such as weight lifting, as part of your weekly exercise routine. Try to do these types of exercises for 30 minutes at least 3 days a week.  Do not use any products that contain nicotine or tobacco, such as cigarettes and e-cigarettes. If you need help quitting, ask your health care provider.  Control any long-term (chronic) conditions you have, such as high cholesterol or diabetes. Monitoring  Monitor your blood pressure at home as told by your health care provider. Your personal target blood pressure may vary depending on your medical conditions, your age, and other factors.  Have your blood pressure checked regularly, as often as told by your health care provider. Working with your health care provider  Review all the medicines you take with your health care provider because there may be side effects or interactions.  Talk with your health care provider about your diet, exercise habits, and other lifestyle factors that may be contributing to hypertension.  Visit your health care provider regularly. Your health care provider can help you create and adjust your plan for managing hypertension. Will I need medicine to control my blood pressure? Your health care provider may prescribe medicine if lifestyle changes are not enough to get your blood pressure under control, and if:  Your systolic blood pressure is 130 or higher.  Your diastolic blood pressure is 80 or higher.  Take medicines only as told by your health care provider. Follow the directions carefully. Blood pressure medicines must be taken as prescribed. The medicine does not work as well when you skip doses. Skipping doses also puts you at risk  for problems. Contact a health care provider if:  You think you are having a reaction to medicines you have taken.  You have repeated (recurrent) headaches.  You feel dizzy.  You have swelling in your ankles.  You have trouble with your vision. Get help right away if:  You develop a severe headache or confusion.  You have unusual weakness or numbness, or you feel faint.  You have severe pain in your chest or abdomen.  You vomit repeatedly.  You have trouble breathing. Summary  Hypertension is when the force of blood pumping through your arteries is too strong. If this condition is not controlled, it may put you at risk for serious complications.  Your personal target blood pressure may vary depending on your medical conditions, your age, and other factors. For most people, a normal blood pressure is less than 120/80.  Hypertension is managed by lifestyle changes, medicines, or both. Lifestyle changes include weight loss, eating a healthy, low-sodium diet, exercising more, and limiting alcohol. This information is not intended to replace advice given to you by your health care provider. Make sure you discuss any questions you have with your health care provider. Document Released: 01/25/2012 Document Revised: 03/30/2016 Document Reviewed: 03/30/2016 Elsevier Interactive Patient Education  [employer].

## 2017-12-05 ENCOUNTER — Ambulatory Visit (HOSPITAL_COMMUNITY)
Admission: RE | Admit: 2017-12-05 | Discharge: 2017-12-05 | Disposition: A | Discharge status: Home / Self Care | Payer: BC Managed Care – PPO | Source: Ambulatory Visit | Attending: Vascular Surgery | Admitting: Vascular Surgery

## 2017-12-05 ENCOUNTER — Ambulatory Visit (INDEPENDENT_AMBULATORY_CARE_PROVIDER_SITE_OTHER): Payer: Self-pay | Admitting: Vascular Surgery

## 2017-12-05 ENCOUNTER — Other Ambulatory Visit: Payer: Self-pay

## 2017-12-05 ENCOUNTER — Encounter: Payer: Self-pay | Admitting: Vascular Surgery

## 2017-12-05 VITALS — BP 180/104 | HR 65 | Temp 97.8°F | Resp 20 | Ht 66.0 in | Wt 267.2 lb

## 2017-12-05 DIAGNOSIS — Z48812 Encounter for surgical aftercare following surgery on the circulatory system: Secondary | ICD-10-CM | POA: Insufficient documentation

## 2017-12-05 DIAGNOSIS — N186 End stage renal disease: Secondary | ICD-10-CM | POA: Insufficient documentation

## 2017-12-05 DIAGNOSIS — Z992 Dependence on renal dialysis: Secondary | ICD-10-CM | POA: Diagnosis not present

## 2017-12-05 NOTE — Progress Notes (Signed)
   Patient name: Denny Mccree MRN: [id number] DOB: 30-May-1970 Sex: male  REASON FOR VISIT: Follow-up first stage basilic vein fistula eft arm on 11/03/2017  HPI: Truett Mcfarlan is a 47 y.o. male here today for follow-up.  He had no difficulty regarding his first stage basilic vein fistula.  Does report some paresthesia in his ulnar aspect of his forearm.  He reports that his catheter is occasionally having positioning issues and has had some clotting issues but is currently still functional.  Current Outpatient Medications  Medication Sig Dispense Refill  . lanthanum (FOSRENOL) 1000 MG chewable tablet Chew 1 tablet (1,000 mg total) by mouth 3 (three) times daily with meals. 90 tablet 0  . PRESCRIPTION MEDICATION Inhale into the lungs at bedtime. CPAP    . zolpidem (AMBIEN) 10 MG tablet Take 1 tablet (10 mg total) by mouth at bedtime as needed for sleep. (Patient taking differently: Take 10 mg by mouth at bedtime. ) 30 tablet 0  . NIFEdipine (PROCARDIA-XL/ADALAT-CC/NIFEDICAL-XL) 30 MG 24 hr tablet Take 30 mg by mouth daily.     No current facility-administered medications for this visit.      PHYSICAL EXAM: Vitals:   12/05/17 0835 12/05/17 0836  BP: (!) 181/103 (!) 180/104  Pulse: 65   Resp: 20   Temp: 97.8 F (36.6 C)   TempSrc: Oral   SpO2: 99%   Weight: 267 lb 3.2 oz (121.2 kg)   Height: 5\' 6"  (1.676 m)     GENERAL: The patient is a well-nourished male, in no acute distress. The vital signs are documented above. He has excellent thrill in his left upper arm fistula.  Antecubital incision is completely healed.  Went duplex today and this shows excellent maturation of his basilic vein fistula with diameter in the 8 to 10 mm range.  MEDICAL ISSUES: Good initial result from left brachiobasilic fistula.  I have recommended second stage transposition to superficial lyse his fistula.  The patient is questioning ability to begin  peritoneal dialysis.  I have suggested that we proceed with second stage transposition even if he opts for peritoneal dialysis to have this as an option.  He is not interested in scheduling at this time and prefers to discuss this with Dr.Deterding or to proceeding.  He will call us if he wishes to proceed with second stage procedure.  I did explain this as an outpatient with several incisions and re-tunneling of the fistula.  We are available to proceed on a nondialysis day if he wishes to proceed   Rosetta Posner, MD [facility] Tel [phone] Pager [phone]

## 2017-12-14 DIAGNOSIS — T8249XA Other complication of vascular dialysis catheter, initial encounter: Secondary | ICD-10-CM | POA: Diagnosis not present

## 2017-12-14 DIAGNOSIS — N186 End stage renal disease: Secondary | ICD-10-CM | POA: Diagnosis not present

## 2017-12-16 DIAGNOSIS — T8249XA Other complication of vascular dialysis catheter, initial encounter: Secondary | ICD-10-CM | POA: Diagnosis not present

## 2017-12-16 DIAGNOSIS — N186 End stage renal disease: Secondary | ICD-10-CM | POA: Diagnosis not present

## 2017-12-19 DIAGNOSIS — T8249XA Other complication of vascular dialysis catheter, initial encounter: Secondary | ICD-10-CM | POA: Diagnosis not present

## 2017-12-19 DIAGNOSIS — N186 End stage renal disease: Secondary | ICD-10-CM | POA: Diagnosis not present

## 2017-12-21 ENCOUNTER — Other Ambulatory Visit: Payer: Self-pay

## 2017-12-21 DIAGNOSIS — N186 End stage renal disease: Secondary | ICD-10-CM | POA: Diagnosis not present

## 2017-12-21 DIAGNOSIS — T8249XA Other complication of vascular dialysis catheter, initial encounter: Secondary | ICD-10-CM | POA: Diagnosis not present

## 2017-12-23 DIAGNOSIS — N186 End stage renal disease: Secondary | ICD-10-CM | POA: Diagnosis not present

## 2017-12-23 DIAGNOSIS — T8249XA Other complication of vascular dialysis catheter, initial encounter: Secondary | ICD-10-CM | POA: Diagnosis not present

## 2017-12-25 NOTE — Telephone Encounter (Signed)
Have we been refilling this or PCP and when was last Rx called in

## 2017-12-26 ENCOUNTER — Telehealth: Payer: Self-pay | Admitting: Cardiology

## 2017-12-26 DIAGNOSIS — T8249XA Other complication of vascular dialysis catheter, initial encounter: Secondary | ICD-10-CM | POA: Diagnosis not present

## 2017-12-26 DIAGNOSIS — N186 End stage renal disease: Secondary | ICD-10-CM | POA: Diagnosis not present

## 2017-12-26 DIAGNOSIS — Z23 Encounter for immunization: Secondary | ICD-10-CM | POA: Diagnosis not present

## 2017-12-26 NOTE — Telephone Encounter (Signed)
New Message:      Pt states he would like a call regarding medication refills. Msg was sent through my chart. Pt did not disclose which medications he is pertaining to.

## 2017-12-26 NOTE — Telephone Encounter (Signed)
Left the patient a message to call back.  

## 2017-12-28 DIAGNOSIS — N186 End stage renal disease: Secondary | ICD-10-CM | POA: Diagnosis not present

## 2017-12-28 DIAGNOSIS — Z23 Encounter for immunization: Secondary | ICD-10-CM | POA: Diagnosis not present

## 2017-12-28 DIAGNOSIS — T8249XA Other complication of vascular dialysis catheter, initial encounter: Secondary | ICD-10-CM | POA: Diagnosis not present

## 2017-12-28 NOTE — Telephone Encounter (Signed)
Attempted to reach patient but was unsuccessful. 

## 2017-12-30 DIAGNOSIS — Z23 Encounter for immunization: Secondary | ICD-10-CM | POA: Diagnosis not present

## 2017-12-30 DIAGNOSIS — T8249XA Other complication of vascular dialysis catheter, initial encounter: Secondary | ICD-10-CM | POA: Diagnosis not present

## 2017-12-30 DIAGNOSIS — N186 End stage renal disease: Secondary | ICD-10-CM | POA: Diagnosis not present

## 2018-01-02 DIAGNOSIS — N186 End stage renal disease: Secondary | ICD-10-CM | POA: Diagnosis not present

## 2018-01-02 DIAGNOSIS — N2581 Secondary hyperparathyroidism of renal origin: Secondary | ICD-10-CM | POA: Diagnosis not present

## 2018-01-02 DIAGNOSIS — T8249XA Other complication of vascular dialysis catheter, initial encounter: Secondary | ICD-10-CM | POA: Diagnosis not present

## 2018-01-03 NOTE — Telephone Encounter (Signed)
After multiple attempts to reach this patient without success and he hasn't called back, I will close encounter.

## 2018-01-04 DIAGNOSIS — N186 End stage renal disease: Secondary | ICD-10-CM | POA: Diagnosis not present

## 2018-01-04 DIAGNOSIS — N2581 Secondary hyperparathyroidism of renal origin: Secondary | ICD-10-CM | POA: Diagnosis not present

## 2018-01-04 DIAGNOSIS — T8249XA Other complication of vascular dialysis catheter, initial encounter: Secondary | ICD-10-CM | POA: Diagnosis not present

## 2018-01-05 DIAGNOSIS — N2581 Secondary hyperparathyroidism of renal origin: Secondary | ICD-10-CM | POA: Insufficient documentation

## 2018-01-06 DIAGNOSIS — T8249XA Other complication of vascular dialysis catheter, initial encounter: Secondary | ICD-10-CM | POA: Diagnosis not present

## 2018-01-06 DIAGNOSIS — N186 End stage renal disease: Secondary | ICD-10-CM | POA: Diagnosis not present

## 2018-01-06 DIAGNOSIS — N2581 Secondary hyperparathyroidism of renal origin: Secondary | ICD-10-CM | POA: Diagnosis not present

## 2018-01-09 DIAGNOSIS — T8249XA Other complication of vascular dialysis catheter, initial encounter: Secondary | ICD-10-CM | POA: Diagnosis not present

## 2018-01-09 DIAGNOSIS — N2581 Secondary hyperparathyroidism of renal origin: Secondary | ICD-10-CM | POA: Diagnosis not present

## 2018-01-09 DIAGNOSIS — N186 End stage renal disease: Secondary | ICD-10-CM | POA: Diagnosis not present

## 2018-01-10 ENCOUNTER — Telehealth: Payer: Self-pay | Admitting: Cardiology

## 2018-01-10 NOTE — Telephone Encounter (Signed)
Would prefer he get it from his PCP

## 2018-01-10 NOTE — Telephone Encounter (Signed)
Will forward to Dr Radford Pax to review and advise on refill request of Ambien.

## 2018-01-10 NOTE — Telephone Encounter (Signed)
Refill for 15 tablets and 1 refill and needs to get PCP for further refills

## 2018-01-10 NOTE — Telephone Encounter (Signed)
New message     *STAT* If patient is at the pharmacy, call can be transferred to refill team.   1. Which medications need to be refilled? (please list name of each medication and dose if known) zolpidem  2. Which pharmacy/location (including street and city if local pharmacy) is medication to be sent to? [facility]   3. Do they need a 30 day or 90 day supply? [place]

## 2018-01-10 NOTE — Telephone Encounter (Signed)
Who has ordered this in the past

## 2018-01-10 NOTE — Telephone Encounter (Signed)
Patient stated he does not have a PCP right now and is requesting that Dr. Radford Pax refill if she could.Patient stated is not getting any sleep and Dr. Radford Pax is his sleep doctor. Will forward to Dr. Radford Pax for advisement.

## 2018-01-10 NOTE — Telephone Encounter (Signed)
Pt is calling requesting a refill on zolpidem. Would Dr. Radford Pax like to refill this medication/ please address

## 2018-01-10 NOTE — Telephone Encounter (Signed)
You ordered this back in 2015, to take as needed for sleep.  Pt is now requesting a refill of this med from you.  Please advise!

## 2018-01-11 DIAGNOSIS — N2581 Secondary hyperparathyroidism of renal origin: Secondary | ICD-10-CM | POA: Diagnosis not present

## 2018-01-11 DIAGNOSIS — N186 End stage renal disease: Secondary | ICD-10-CM | POA: Diagnosis not present

## 2018-01-11 DIAGNOSIS — T8249XA Other complication of vascular dialysis catheter, initial encounter: Secondary | ICD-10-CM | POA: Diagnosis not present

## 2018-01-11 MED ORDER — ZOLPIDEM TARTRATE 10 MG PO TABS: 10.0000 mg | ORAL_TABLET | Freq: Every evening | ORAL | 1 refills | Status: DC | PRN

## 2018-01-11 NOTE — Telephone Encounter (Signed)
Left message for patient to call back  

## 2018-01-11 NOTE — Telephone Encounter (Signed)
Called and made patient aware that Dr. Radford Pax was okay with refilling ambien with 15 tablets and 1 refill. Made patient aware that per Dr. Radford Pax, he will need to establish with a PCP and have them refill it in the future. Patient verbalized understanding. Rx sent to preferred pharmacy.

## 2018-01-12 ENCOUNTER — Telehealth: Payer: Self-pay | Admitting: Cardiology

## 2018-01-12 NOTE — Telephone Encounter (Signed)
Called pt and left message informing pt that his medication was phone in to his pharmacy for 15 tablets with 1 refill per Dr. Radford Pax. Drue Novel I, RN       8:15 AM  Note    Called and made patient aware that Dr. Radford Pax was okay with refilling ambien with 15 tablets and 1 refill. Made patient aware that per Dr. Radford Pax, he will need to establish with a PCP and have them refill it in the future. Patient verbalized understanding. Rx sent to preferred pharmacy.

## 2018-01-12 NOTE — Telephone Encounter (Signed)
° °  Patient requesting phone call when sent to pharmacy    1. Which medications need to be refilled? (please list name of each medication and dose if known) zolpidem (AMBIEN) 10 MG tablet  2. Which pharmacy/location (including street and city if local pharmacy) is medication to be sent to[facility]   3. Do they need a 30 day or 90 day supply? [place]

## 2018-01-13 DIAGNOSIS — N2581 Secondary hyperparathyroidism of renal origin: Secondary | ICD-10-CM | POA: Diagnosis not present

## 2018-01-13 DIAGNOSIS — T8249XA Other complication of vascular dialysis catheter, initial encounter: Secondary | ICD-10-CM | POA: Diagnosis not present

## 2018-01-13 DIAGNOSIS — N186 End stage renal disease: Secondary | ICD-10-CM | POA: Diagnosis not present

## 2018-01-24 ENCOUNTER — Other Ambulatory Visit: Payer: Self-pay | Admitting: Gastroenterology

## 2018-02-07 ENCOUNTER — Encounter: Payer: Self-pay | Admitting: Nephrology

## 2018-03-09 DIAGNOSIS — Z7682 Awaiting organ transplant status: Secondary | ICD-10-CM | POA: Insufficient documentation

## 2018-03-12 ENCOUNTER — Ambulatory Visit: Payer: BC Managed Care – PPO | Admitting: Cardiology

## 2018-03-16 ENCOUNTER — Other Ambulatory Visit: Payer: Self-pay | Admitting: Nephrology

## 2018-03-16 DIAGNOSIS — R935 Abnormal findings on diagnostic imaging of other abdominal regions, including retroperitoneum: Secondary | ICD-10-CM

## 2018-03-19 ENCOUNTER — Encounter (HOSPITAL_COMMUNITY): Admission: RE | Payer: Self-pay | Source: Ambulatory Visit

## 2018-03-19 ENCOUNTER — Ambulatory Visit (HOSPITAL_COMMUNITY)
Admission: RE | Admit: 2018-03-19 | Payer: BC Managed Care – PPO | Source: Ambulatory Visit | Admitting: Gastroenterology

## 2018-03-19 SURGERY — COLONOSCOPY WITH PROPOFOL
Anesthesia: Monitor Anesthesia Care

## 2018-03-20 ENCOUNTER — Other Ambulatory Visit: Payer: BC Managed Care – PPO

## 2018-03-20 DIAGNOSIS — Z01818 Encounter for other preprocedural examination: Secondary | ICD-10-CM | POA: Insufficient documentation

## 2018-04-10 ENCOUNTER — Other Ambulatory Visit: Payer: BC Managed Care – PPO

## 2018-06-18 NOTE — Progress Notes (Deleted)
Cardiology Office Note:    Date:  06/18/2018   ID:  Adrian Frye, DOB 05-Feb-1971, MRN [id number]  PCP:  Seward Carol, MD  Cardiologist:  No primary care provider on file.    Referring MD: Seward Carol, MD   No chief complaint on file.   History of Present Illness:    Adrian Frye is a 48 y.o. male with a hx of DCM, HTN, OSA on CPAP and obesity.  He is doing well with his CPAP device and thinks that he has gotten used to it.  He tolerates the mask and feels the pressure is adequate.  Since going on CPAP he feels rested in the am and has no significant daytime sleepiness.  He denies any significant mouth or nasal dryness or nasal congestion.  He does not think that he snores.     Past Medical History:  Diagnosis Date  . Allergic rhinitis   . Chronic kidney disease    stage III-IV  . Diabetes mellitus without complication ([place])   . Dilated idiopathic cardiomyopathy ([place])    now resoved with EF 55% by echo 2011  . Edema   . Encounter for blood transfusion   . Hyperlipidemia   . Hyperparathyroidism, secondary ([place])   . Hypertension   . Morbid obesity ([place])   . Sleep apnea   . UGI bleed 2011   ASA    Past Surgical History:  Procedure Laterality Date  . ACHILLES TENDON REPAIR     ruptured; right  . AV FISTULA PLACEMENT Left 11/03/2017   Procedure: ARTERIOVENOUS (AV) FISTULA CREATION  left upper arm;  Surgeon: Rosetta Posner, MD;  Location: [place];  Service: Vascular;  Laterality: Left;  . DIALYSIS FISTULA CREATION    . INSERTION OF DIALYSIS CATHETER N/A 11/03/2017   Procedure: INSERTION OF DIALYSIS CATHETER - RIGHT INTERNAL JUGULAR PLACEMENT;  Surgeon: Rosetta Posner, MD;  Location: [facility];  Service: Vascular;  Laterality: N/A;  . KIDNEY FAILURE    . LAPAROSCOPIC GASTROTOMY W/ REPAIR OF ULCER    . PLEURAL SCARIFICATION     left, football trauma-chest tube  . TONSILLECTOMY    . TONSILLECTOMY      Current Medications: No outpatient medications have been  marked as taking for the 06/19/18 encounter (Appointment) with Sueanne Margarita, MD.     Allergies:   Lisinopril and Losartan   Social History   Socioeconomic History  . Marital status: Married    Spouse name: Not on file  . Number of children: Not on file  . Years of education: Not on file  . Highest education level: Not on file  Occupational History  . Occupation: Clinical cytogeneticist  Social Needs  . Financial resource strain: Not on file  . Food insecurity:    Worry: Not on file    Inability: Not on file  . Transportation needs:    Medical: Not on file    Non-medical: Not on file  Tobacco Use  . Smoking status: Former Smoker    Types: Cigarettes    Last attempt to quit: 05/16/2009    Years since quitting: 9.0  . Smokeless tobacco: Never Used  Substance and Sexual Activity  . Alcohol use: Yes    Alcohol/week: 2.0 standard drinks    Types: 2 Cans of beer per week  . Drug use: No  . Sexual activity: Not on file  Lifestyle  . Physical activity:    Days per week: Not on file  Minutes per session: Not on file  . Stress: Not on file  Relationships  . Social connections:    Talks on phone: Not on file    Gets together: Not on file    Attends religious service: Not on file    Active member of club or organization: Not on file    Attends meetings of clubs or organizations: Not on file    Relationship status: Not on file  Other Topics Concern  . Not on file  Social History Narrative  . Not on file     Family History: The patient's family history includes Diabetes in his mother; Heart disease in his father; Heart failure in his mother; Hypertension in his brother and mother.  ROS:   Please see the history of present illness.    ROS  All other systems reviewed and negative.   EKGs/Labs/Other Studies Reviewed:    The following studies were reviewed today: PAP download  EKG:  EKG is  ordered today.  The ekg ordered today demonstrates ***  Recent  Labs: 11/02/2017: Magnesium 3.3 11/03/2017: ALT 9 11/08/2017: BUN 57; Creatinine, Ser 13.66; Hemoglobin 9.9; Platelets 80; Potassium 3.6; Sodium 134   Recent Lipid Panel No results found for: CHOL, TRIG, HDL, CHOLHDL, VLDL, LDLCALC, LDLDIRECT  Physical Exam:    VS:  There were no vitals taken for this visit.    Wt Readings from Last 3 Encounters:  12/05/17 267 lb 3.2 oz (121.2 kg)  11/24/17 261 lb (118.4 kg)  11/08/17 261 lb 0.4 oz (118.4 kg)     GEN:  Well nourished, well developed in no acute distress HEENT: Normal NECK: No JVD; No carotid bruits LYMPHATICS: No lymphadenopathy CARDIAC: RRR, no murmurs, rubs, gallops RESPIRATORY:  Clear to auscultation without rales, wheezing or rhonchi  ABDOMEN: Soft, non-tender, non-distended MUSCULOSKELETAL:  No edema; No deformity  SKIN: Warm and dry NEUROLOGIC:  Alert and oriented x 3 PSYCHIATRIC:  Normal affect   ASSESSMENT:    1. Dilated idiopathic cardiomyopathy ([place])   2. Essential hypertension   3. Chronic diastolic heart failure ([place])   4. Obstructive sleep apnea   5. Morbid obesity ([place])    PLAN:    In order of problems listed above:  1.  DCM - resolved with EF 65-70% on echo 06/2013.  2.  HTN - BP is controlled on exam today.  He will continue on Nifedipine XL 30mg  daily.  3.  Chronic diastolic CHF - he appears euvolemic on exam today.  He has not required any diuretics.  4.  OSA - the patient is tolerating PAP therapy well without any problems. The PAP download was reviewed today and showed an AHI of ***/hr on *** cm H2O with ***% compliance in using more than 4 hours nightly.  The patient has been using and benefiting from PAP use and will continue to benefit from therapy.      Medication Adjustments/Labs and Tests Ordered: Current medicines are reviewed at length with the patient today.  Concerns regarding medicines are outlined above.  No orders of the defined types were placed in this encounter.  No orders of  the defined types were placed in this encounter.   Signed, [name], MD  06/18/2018 7:18 PM    [place]

## 2018-06-19 ENCOUNTER — Ambulatory Visit: Payer: Self-pay | Admitting: Cardiology

## 2018-06-19 DIAGNOSIS — R0989 Other specified symptoms and signs involving the circulatory and respiratory systems: Secondary | ICD-10-CM

## 2018-06-20 ENCOUNTER — Encounter: Payer: Self-pay | Admitting: Cardiology

## 2018-07-04 ENCOUNTER — Telehealth: Payer: Self-pay | Admitting: *Deleted

## 2018-07-04 NOTE — Telephone Encounter (Signed)
Informed patient of compliance results and verbalized understanding was indicated. Patient is aware and agreeable to AHI being within range at 3.0. Patient is aware and agreeable to being in compliance with machine usage Patient is aware and agreeable to no change in current pressures.

## 2018-07-04 NOTE — Telephone Encounter (Signed)
-----   Message from Sueanne Margarita, MD sent at 06/28/2018 10:44 PM EST ----- Good AHI and compliance.  Continue current PAP settings.

## 2018-12-17 DIAGNOSIS — T782XXA Anaphylactic shock, unspecified, initial encounter: Secondary | ICD-10-CM | POA: Insufficient documentation

## 2018-12-17 DIAGNOSIS — T7840XA Allergy, unspecified, initial encounter: Secondary | ICD-10-CM | POA: Insufficient documentation

## 2018-12-19 ENCOUNTER — Encounter: Payer: Self-pay | Admitting: Internal Medicine

## 2019-02-06 DIAGNOSIS — I509 Heart failure, unspecified: Secondary | ICD-10-CM | POA: Insufficient documentation

## 2019-02-07 ENCOUNTER — Ambulatory Visit: Payer: 59 | Admitting: Internal Medicine

## 2019-02-07 ENCOUNTER — Encounter: Payer: Self-pay | Admitting: Internal Medicine

## 2019-02-07 VITALS — BP 138/80 | HR 80 | Temp 97.6°F | Ht 67.0 in | Wt 286.0 lb

## 2019-02-07 DIAGNOSIS — R195 Other fecal abnormalities: Secondary | ICD-10-CM | POA: Diagnosis not present

## 2019-02-07 DIAGNOSIS — Z992 Dependence on renal dialysis: Secondary | ICD-10-CM | POA: Diagnosis not present

## 2019-02-07 DIAGNOSIS — D649 Anemia, unspecified: Secondary | ICD-10-CM

## 2019-02-07 MED ORDER — NA SULFATE-K SULFATE-MG SULF 17.5-3.13-1.6 GM/177ML PO SOLN: 1.0000 | Freq: Once | ORAL | 0 refills | Status: AC

## 2019-02-07 NOTE — Progress Notes (Signed)
HISTORY OF PRESENT ILLNESS:  Adrian Frye is a pleasant 48 y.o. male, [employer] graduate and Clinical cytogeneticist, with multiple significant medical problems including end-stage renal disease on hemodialysis and morbid obesity who is sent today by his physicians at the dialysis center regarding a drop in hemoglobin with anemia and Hemoccult-positive stool.  The patient underwent upper endoscopy in May 2011 for melena.  He was found to have an antral ulcer as well as antral erosions and duodenitis.  The ulcer was treated with endoscopic hemostatic therapy.  Testing for Helicobacter pylori was negative.  He has not had GI evaluation since.  Review of outside blood work from July 2020 shows anemia with hemoglobin 8.6.  No evidence for iron deficiency.  However patient was placed on iron therapy at that time.  Stool Hemoccult testing was positive x2.  Previous hemoglobin in June 2020 was 10.6.  Patient did report some dark stools but was not certain whether this was iron related.  He is on dialysis Monday, Wednesday, and Friday.  Though he remains significantly obese, he has lost 100 pounds by intent.  He is hopeful for an eventual kidney transplant.  He denies currently using aspirin or NSAIDs.  He has not had prior colonoscopy.  His GI review of systems is otherwise remarkable for occasional constipation.  No family history of colon cancer.  Review of outside x-rays shows abdominal x-rays from February 2018 with no acute findings.  REVIEW OF SYSTEMS:  All non-GI ROS negative unless otherwise stated in the HPI except for hearing problems  Past Medical History:  Diagnosis Date  . Allergic rhinitis   . Benign essential hypertension   . Chronic kidney disease    stage III-IV  . Diabetes mellitus without complication ([place])   . Dilated idiopathic cardiomyopathy ([place])    now resoved with EF 55% by echo 2011  . Edema   . Encounter for blood transfusion   . Heart disease   . Hyperlipidemia   .  Hyperparathyroidism, secondary ([place])   . Hypertension   . Morbid obesity ([place])   . Sleep apnea   . UGI bleed 2011   ASA    Past Surgical History:  Procedure Laterality Date  . ACHILLES TENDON REPAIR     ruptured; right  . AV FISTULA PLACEMENT Left 11/03/2017   Procedure: ARTERIOVENOUS (AV) FISTULA CREATION  left upper arm;  Surgeon: Rosetta Posner, MD;  Location: [place];  Service: Vascular;  Laterality: Left;  . DIALYSIS FISTULA CREATION    . INSERTION OF DIALYSIS CATHETER N/A 11/03/2017   Procedure: INSERTION OF DIALYSIS CATHETER - RIGHT INTERNAL JUGULAR PLACEMENT;  Surgeon: Rosetta Posner, MD;  Location[facility];  Service: Vascular;  Laterality: N/A;  . KIDNEY FAILURE    . LAPAROSCOPIC GASTROTOMY W/ REPAIR OF ULCER    . PLEURAL SCARIFICATION     left, football trauma-chest tube  . TONSILLECTOMY    . TONSILLECTOMY      Social History [place]  reports that he quit smoking about 9 years ago. His smoking use included cigarettes. He has never used smokeless tobacco. He reports current alcohol use of about 2.0 standard drinks of alcohol per week. He reports that he does not use drugs.  family history includes Diabetes in his mother; Heart disease in his father; Heart failure in his mother; Hypertension in his brother and mother.  Allergies  Allergen Reactions  . Lisinopril Swelling    Angioedema   . Losartan Swelling  PHYSICAL EXAMINATION: Vital signs: BP 138/80   Pulse 80   Temp 97.6 F (36.4 C)   Ht 5\' 7"  (1.702 m)   Wt 286 lb (129.7 kg)   BMI 44.79 kg/m   Constitutional: Pleasant, generally well-appearing, no acute distress Psychiatric: alert and oriented x3, cooperative Eyes: extraocular movements intact, anicteric, conjunctiva pink Mouth: oral pharynx moist, no lesions Neck: supple no lymphadenopathy Cardiovascular: heart regular rate and rhythm, no murmur Lungs: clear to auscultation bilaterally Abdomen: soft, obese, nontender, nondistended, no  obvious ascites, no peritoneal signs, normal bowel sounds, no organomegaly Rectal: Deferred until colonoscopy Extremities: no clubbing or cyanosis.  Trace lower extremity edema bilaterally Skin: no lesions on visible extremities Neuro: No focal deficits.  Cranial nerves intact  ASSESSMENT:  1.  Anemia with drifting hemoglobin 2.  Hemoccult positive stool 3.  History of gastric ulcer with bleeding 4.  Mild constipation 5.  End-stage renal disease on dialysis 6.  Morbid obesity with sleep apnea   PLAN:  1.  Schedule colonoscopy and upper endoscopy to evaluate the anemia, Hemoccult positive stool, and possible melena.  Patient is HIGH RISK given his comorbidities and body habitus.  He will require monitored anesthesia care for his sedation.  As well, his procedure will need to be performed on a nondialysis day.The nature of the procedure, as well as the risks, benefits, and alternatives were carefully and thoroughly reviewed with the patient. Ample time for discussion and questions allowed. The patient understood, was satisfied, and agreed to proceed.  I have asked him to hold his iron therapy for 1 week prior to the procedure in order to maximize his colonic preparation.

## 2019-02-07 NOTE — Patient Instructions (Signed)
You have been scheduled for an endoscopy and colonoscopy. Please follow the written instructions given to you at your visit today. Please pick up your prep supplies at the pharmacy within the next 1-3 days. If you use inhalers (even only as needed), please bring them with you on the day of your procedure.  

## 2019-03-08 ENCOUNTER — Encounter: Payer: Self-pay | Admitting: Internal Medicine

## 2019-03-11 ENCOUNTER — Telehealth: Payer: Self-pay

## 2019-03-11 NOTE — Telephone Encounter (Signed)
Covid-19 screening questions   Do you now or have you had a fever in the last 14 days?  Do you have any respiratory symptoms of shortness of breath or cough now or in the last 14 days?  Do you have any family members or close contacts with diagnosed or suspected Covid-19 in the past 14 days?  Have you been tested for Covid-19 and found to be positive?       

## 2019-03-11 NOTE — Telephone Encounter (Signed)
Pt responded "no" to all questions.

## 2019-03-12 ENCOUNTER — Ambulatory Visit (AMBULATORY_SURGERY_CENTER): Payer: 59 | Admitting: Internal Medicine

## 2019-03-12 ENCOUNTER — Encounter: Payer: Self-pay | Admitting: Internal Medicine

## 2019-03-12 ENCOUNTER — Other Ambulatory Visit: Payer: Self-pay

## 2019-03-12 VITALS — BP 154/84 | HR 74 | Temp 98.3°F | Resp 18 | Ht 67.0 in | Wt 286.0 lb

## 2019-03-12 DIAGNOSIS — D122 Benign neoplasm of ascending colon: Secondary | ICD-10-CM

## 2019-03-12 DIAGNOSIS — D649 Anemia, unspecified: Secondary | ICD-10-CM

## 2019-03-12 DIAGNOSIS — D124 Benign neoplasm of descending colon: Secondary | ICD-10-CM

## 2019-03-12 DIAGNOSIS — K5730 Diverticulosis of large intestine without perforation or abscess without bleeding: Secondary | ICD-10-CM | POA: Diagnosis not present

## 2019-03-12 DIAGNOSIS — R195 Other fecal abnormalities: Secondary | ICD-10-CM | POA: Diagnosis not present

## 2019-03-12 DIAGNOSIS — K648 Other hemorrhoids: Secondary | ICD-10-CM | POA: Diagnosis not present

## 2019-03-12 MED ORDER — SODIUM CHLORIDE 0.9 % IV SOLN: 500.0000 mL | Freq: Once | INTRAVENOUS | Status: DC

## 2019-03-12 NOTE — Progress Notes (Signed)
Called to room to assist during endoscopic procedure.  Patient ID and intended procedure confirmed with present staff. Received instructions for my participation in the procedure from the performing physician.  

## 2019-03-12 NOTE — Progress Notes (Signed)
Report to PACU, RN, vss, BBS= Clear.  

## 2019-03-12 NOTE — Patient Instructions (Signed)
YOU HAD AN ENDOSCOPIC PROCEDURE TODAY AT [facility]:   Refer to the procedure report that was given to you for any specific questions about what was found during the examination.  If the procedure report does not answer your questions, please call your gastroenterologist to clarify.  If you requested that your care partner not be given the details of your procedure findings, then the procedure report has been included in a sealed envelope for you to review at your convenience later.  YOU SHOULD EXPECT: Some feelings of bloating in the abdomen. Passage of more gas than usual.  Walking can help get rid of the air that was put into your GI tract during the procedure and reduce the bloating. If you had a lower endoscopy (such as a colonoscopy or flexible sigmoidoscopy) you may notice spotting of blood in your stool or on the toilet paper. If you underwent a bowel prep for your procedure, you may not have a normal bowel movement for a few days.  Please Note:  You might notice some irritation and congestion in your nose or some drainage.  This is from the oxygen used during your procedure.  There is no need for concern and it should clear up in a day or so.  SYMPTOMS TO REPORT IMMEDIATELY:   Following lower endoscopy (colonoscopy or flexible sigmoidoscopy):  Excessive amounts of blood in the stool  Significant tenderness or worsening of abdominal pains  Swelling of the abdomen that is new, acute  Fever of 100F or higher   Following upper endoscopy (EGD)  Vomiting of blood or coffee ground material  New chest pain or pain under the shoulder blades  Painful or persistently difficult swallowing  New shortness of breath  Fever of 100F or higher  Black, tarry-looking stools  For urgent or emergent issues, a gastroenterologist can be reached at any hour by calling [phone].   DIET:  We do recommend a small meal at first, but then you may proceed to your regular diet.  Drink  plenty of fluids but you should avoid alcoholic beverages for 24 hours.  MEDICATIONS: Continue present medications.  Please see handouts given to you by your recovery nurse.  ACTIVITY:  You should plan to take it easy for the rest of today and you should NOT DRIVE or use heavy machinery until tomorrow (because of the sedation medicines used during the test).    FOLLOW UP: Our staff will call the number listed on your records 48-72 hours following your procedure to check on you and address any questions or concerns that you may have regarding the information given to you following your procedure. If we do not reach you, we will leave a message.  We will attempt to reach you two times.  During this call, we will ask if you have developed any symptoms of COVID 19. If you develop any symptoms (ie: fever, flu-like symptoms, shortness of breath, cough etc.) before then, please call [phone].  If you test positive for Covid 19 in the 2 weeks post procedure, please call and report this information to [place].    If any biopsies were taken you will be contacted by phone or by letter within the next 1-3 weeks.  Please call us at [phone] if you have not heard about the biopsies in 3 weeks.   Thank you for allowing [place] to provide for your healthcare needs today.   SIGNATURES/CONFIDENTIALITY: You and/or your care partner have signed paperwork which will  be entered into your electronic medical record.  These signatures attest to the fact that that the information above on your After Visit Summary has been reviewed and is understood.  Full responsibility of the confidentiality of this discharge information lies with you and/or your care-partner. 

## 2019-03-12 NOTE — Op Note (Signed)
[name] Procedure Date: 03/12/2019 10:39 AM MRN: [id number] Endoscopist: Docia Chuck. Adrian Frye , MD Age: 48 Referring MD:  Date of Birth: [birth year] Gender: Male Account #: [id number] Procedure:                Colonoscopy with hot snare polypectomy x 2; with                            submucosal injection (tattoo) Indications:              Heme positive stool, Iron deficiency anemia                            secondary to chronic blood loss Medicines:                Monitored Anesthesia Care Procedure:                Pre-Anesthesia Assessment:                           - Prior to the procedure, a History and Physical                            was performed, and patient medications and                            allergies were reviewed. The patient's tolerance of                            previous anesthesia was also reviewed. The risks                            and benefits of the procedure and the sedation                            options and risks were discussed with the patient.                            All questions were answered, and informed consent                            was obtained. Prior Anticoagulants: The patient has                            taken no previous anticoagulant or antiplatelet                            agents. ASA Grade Assessment: III - A patient with                            severe systemic disease. After reviewing the risks                            and benefits, the patient was deemed in  satisfactory condition to undergo the procedure.                           After obtaining informed consent, the colonoscope                            was passed under direct vision. Throughout the                            procedure, the patient's blood pressure, pulse, and                            oxygen saturations were monitored continuously. The                            Colonoscope  was introduced through the anus and                            advanced to the the cecum, identified by                            appendiceal orifice and ileocecal valve. The                            ileocecal valve, appendiceal orifice, and rectum                            were photographed. The quality of the bowel                            preparation was excellent. The colonoscopy was                            performed without difficulty. The patient tolerated                            the procedure well. The bowel preparation used was                            SUPREP via split dose instruction. Scope In: 10:45:53 AM Scope Out: 11:03:51 AM Scope Withdrawal Time: 0 hours 14 minutes 14 seconds  Total Procedure Duration: 0 hours 17 minutes 58 seconds  Findings:                 A 6 mm polyp was found in the ascending colon. The                            polyp was pedunculated. The polyp was removed with                            a hot snare. Resection and retrieval were complete.                           A 35 mm polyp was found in  the descending colon.                            The polyp was pedunculated. The polyp was removed                            with a hot snare. Resection and retrieval were                            complete. Area was tattooed with an injection of                            Spot (carbon black).                           Multiple small and large-mouthed diverticula were                            found in the entire colon.                           Internal hemorrhoids were found during retroflexion.                           The exam was otherwise without abnormality on                            direct and retroflexion views. Complications:            No immediate complications. Estimated blood loss:                            None. Estimated Blood Loss:     Estimated blood loss: none. Impression:               - One 6 mm polyp in the ascending  colon, removed                            with a hot snare. Resected and retrieved.                           - One 35 mm polyp in the descending colon, removed                            with a hot snare. Resected and retrieved. Tattooed.                           - Diverticulosis in the entire examined colon.                           - Internal hemorrhoids.                           - The examination was otherwise normal on direct  and retroflexion views. Recommendation:           - Repeat colonoscopy in 3 years for surveillance if                            polyp benign.                           - Patient has a contact number available for                            emergencies. The signs and symptoms of potential                            delayed complications were discussed with the                            patient. Return to normal activities tomorrow.                            Written discharge instructions were provided to the                            patient.                           - Resume previous diet.                           - Continue present medications.                           - Await pathology results.                           - EGD today. Please see report Docia Chuck. Adrian Pastor, MD 03/12/2019 11:21:04 AM This report has been signed electronically.

## 2019-03-12 NOTE — Op Note (Signed)
[facility] Patient Name: Adrian Frye Procedure Date: 03/12/2019 10:39 AM MRN: [id number] Endoscopist: Docia Chuck. [name] , MD Age: 48 Referring MD:  Date of Birth: [birth year] Gender: Male Account #: [id number] Procedure:                Upper GI endoscopy Indications:              Iron deficiency anemia secondary to chronic blood                            loss, Heme positive stool Medicines:                Monitored Anesthesia Care Procedure:                Pre-Anesthesia Assessment:                           - Prior to the procedure, a History and Physical                            was performed, and patient medications and                            allergies were reviewed. The patient's tolerance of                            previous anesthesia was also reviewed. The risks                            and benefits of the procedure and the sedation                            options and risks were discussed with the patient.                            All questions were answered, and informed consent                            was obtained. Prior Anticoagulants: The patient has                            taken no previous anticoagulant or antiplatelet                            agents. ASA Grade Assessment: III - A patient with                            severe systemic disease. After reviewing the risks                            and benefits, the patient was deemed in                            satisfactory condition to undergo the procedure.  After obtaining informed consent, the endoscope was                            passed under direct vision. Throughout the                            procedure, the patient's blood pressure, pulse, and                            oxygen saturations were monitored continuously. The                            Endoscope was introduced through the mouth, and                            advanced to the second part  of duodenum. The upper                            GI endoscopy was accomplished without difficulty.                            The patient tolerated the procedure well. Scope In: Scope Out: Findings:                 The esophagus was normal.                           The stomach was normal.                           The examined duodenum was normal.                           The cardia and gastric fundus were normal on                            retroflexion. Complications:            No immediate complications. Estimated Blood Loss:     Estimated blood loss: none. Impression:               1. Normal EGD. Recommendation:           - Patient has a contact number available for                            emergencies. The signs and symptoms of potential                            delayed complications were discussed with the                            patient. Return to normal activities tomorrow.                            Written discharge instructions were provided to the  patient.                           - Resume previous diet.                           - Continue present medications.                           - Return to the care of your primary physicians. Docia Chuck. [name], MD 03/12/2019 11:23:25 AM This report has been signed electronically.

## 2019-03-12 NOTE — Progress Notes (Signed)
LC - Temp CW - VS

## 2019-03-14 ENCOUNTER — Telehealth: Payer: Self-pay

## 2019-03-14 ENCOUNTER — Telehealth: Payer: Self-pay | Admitting: *Deleted

## 2019-03-14 NOTE — Telephone Encounter (Signed)
  Follow up Call-  Call back number 03/12/2019  Post procedure Call Back phone  # [phone] cell  Permission to leave phone message Yes  Some recent data might be hidden     Patient questions:  Do you have a fever, pain , or abdominal swelling? No. Pain Score  0 *  Have you tolerated food without any problems? Yes.    Have you been able to return to your normal activities? Yes.    Do you have any questions about your discharge instructions: Diet   No. Medications  No. Follow up visit  No.  Do you have questions or concerns about your Care? No.  Actions: * If pain score is 4 or above: No action needed, pain <4.   1. Have you developed a fever since your procedure? no  2.   Have you had an respiratory symptoms (SOB or cough) since your procedure? no  3.   Have you tested positive for COVID 19 since your procedure no  4.   Have you had any family members/close contacts diagnosed with the COVID 19 since your procedure?  no   If yes to any of these questions please route to Joylene John, RN and Alphonsa Gin, Therapist, sports.

## 2019-03-14 NOTE — Telephone Encounter (Signed)
Left message on follow up call. 

## 2019-03-19 ENCOUNTER — Encounter: Payer: Self-pay | Admitting: Internal Medicine

## 2019-05-18 DIAGNOSIS — Z992 Dependence on renal dialysis: Secondary | ICD-10-CM | POA: Diagnosis not present

## 2019-05-18 DIAGNOSIS — N2581 Secondary hyperparathyroidism of renal origin: Secondary | ICD-10-CM | POA: Diagnosis not present

## 2019-05-18 DIAGNOSIS — D689 Coagulation defect, unspecified: Secondary | ICD-10-CM | POA: Diagnosis not present

## 2019-05-18 DIAGNOSIS — T8249XA Other complication of vascular dialysis catheter, initial encounter: Secondary | ICD-10-CM | POA: Diagnosis not present

## 2019-05-18 DIAGNOSIS — N186 End stage renal disease: Secondary | ICD-10-CM | POA: Diagnosis not present

## 2019-05-20 DIAGNOSIS — D689 Coagulation defect, unspecified: Secondary | ICD-10-CM | POA: Diagnosis not present

## 2019-05-20 DIAGNOSIS — Z992 Dependence on renal dialysis: Secondary | ICD-10-CM | POA: Diagnosis not present

## 2019-05-20 DIAGNOSIS — N2581 Secondary hyperparathyroidism of renal origin: Secondary | ICD-10-CM | POA: Diagnosis not present

## 2019-05-20 DIAGNOSIS — D509 Iron deficiency anemia, unspecified: Secondary | ICD-10-CM | POA: Diagnosis not present

## 2019-05-20 DIAGNOSIS — N186 End stage renal disease: Secondary | ICD-10-CM | POA: Diagnosis not present

## 2019-05-20 DIAGNOSIS — T8249XA Other complication of vascular dialysis catheter, initial encounter: Secondary | ICD-10-CM | POA: Diagnosis not present

## 2019-05-22 DIAGNOSIS — Z992 Dependence on renal dialysis: Secondary | ICD-10-CM | POA: Diagnosis not present

## 2019-05-22 DIAGNOSIS — N2581 Secondary hyperparathyroidism of renal origin: Secondary | ICD-10-CM | POA: Diagnosis not present

## 2019-05-22 DIAGNOSIS — N186 End stage renal disease: Secondary | ICD-10-CM | POA: Diagnosis not present

## 2019-05-22 DIAGNOSIS — Z4902 Encounter for fitting and adjustment of peritoneal dialysis catheter: Secondary | ICD-10-CM | POA: Diagnosis not present

## 2019-05-22 DIAGNOSIS — E1122 Type 2 diabetes mellitus with diabetic chronic kidney disease: Secondary | ICD-10-CM | POA: Diagnosis not present

## 2019-05-22 DIAGNOSIS — T8249XA Other complication of vascular dialysis catheter, initial encounter: Secondary | ICD-10-CM | POA: Diagnosis not present

## 2019-05-22 DIAGNOSIS — I120 Hypertensive chronic kidney disease with stage 5 chronic kidney disease or end stage renal disease: Secondary | ICD-10-CM | POA: Diagnosis not present

## 2019-05-22 DIAGNOSIS — D689 Coagulation defect, unspecified: Secondary | ICD-10-CM | POA: Diagnosis not present

## 2019-05-22 DIAGNOSIS — D509 Iron deficiency anemia, unspecified: Secondary | ICD-10-CM | POA: Diagnosis not present

## 2019-05-24 DIAGNOSIS — N186 End stage renal disease: Secondary | ICD-10-CM | POA: Diagnosis not present

## 2019-05-24 DIAGNOSIS — D509 Iron deficiency anemia, unspecified: Secondary | ICD-10-CM | POA: Diagnosis not present

## 2019-05-24 DIAGNOSIS — Z992 Dependence on renal dialysis: Secondary | ICD-10-CM | POA: Diagnosis not present

## 2019-05-24 DIAGNOSIS — D689 Coagulation defect, unspecified: Secondary | ICD-10-CM | POA: Diagnosis not present

## 2019-05-24 DIAGNOSIS — N2581 Secondary hyperparathyroidism of renal origin: Secondary | ICD-10-CM | POA: Diagnosis not present

## 2019-05-24 DIAGNOSIS — T8249XA Other complication of vascular dialysis catheter, initial encounter: Secondary | ICD-10-CM | POA: Diagnosis not present

## 2019-05-27 DIAGNOSIS — D509 Iron deficiency anemia, unspecified: Secondary | ICD-10-CM | POA: Diagnosis not present

## 2019-05-27 DIAGNOSIS — N2581 Secondary hyperparathyroidism of renal origin: Secondary | ICD-10-CM | POA: Diagnosis not present

## 2019-05-27 DIAGNOSIS — Z992 Dependence on renal dialysis: Secondary | ICD-10-CM | POA: Diagnosis not present

## 2019-05-27 DIAGNOSIS — N186 End stage renal disease: Secondary | ICD-10-CM | POA: Diagnosis not present

## 2019-05-27 DIAGNOSIS — T8249XA Other complication of vascular dialysis catheter, initial encounter: Secondary | ICD-10-CM | POA: Diagnosis not present

## 2019-05-27 DIAGNOSIS — D689 Coagulation defect, unspecified: Secondary | ICD-10-CM | POA: Diagnosis not present

## 2019-05-29 DIAGNOSIS — Z992 Dependence on renal dialysis: Secondary | ICD-10-CM | POA: Diagnosis not present

## 2019-05-29 DIAGNOSIS — N186 End stage renal disease: Secondary | ICD-10-CM | POA: Diagnosis not present

## 2019-05-29 DIAGNOSIS — D509 Iron deficiency anemia, unspecified: Secondary | ICD-10-CM | POA: Diagnosis not present

## 2019-05-29 DIAGNOSIS — D689 Coagulation defect, unspecified: Secondary | ICD-10-CM | POA: Diagnosis not present

## 2019-05-29 DIAGNOSIS — T8249XA Other complication of vascular dialysis catheter, initial encounter: Secondary | ICD-10-CM | POA: Diagnosis not present

## 2019-05-29 DIAGNOSIS — N2581 Secondary hyperparathyroidism of renal origin: Secondary | ICD-10-CM | POA: Diagnosis not present

## 2019-05-31 DIAGNOSIS — N2581 Secondary hyperparathyroidism of renal origin: Secondary | ICD-10-CM | POA: Diagnosis not present

## 2019-05-31 DIAGNOSIS — T8249XA Other complication of vascular dialysis catheter, initial encounter: Secondary | ICD-10-CM | POA: Diagnosis not present

## 2019-05-31 DIAGNOSIS — D509 Iron deficiency anemia, unspecified: Secondary | ICD-10-CM | POA: Diagnosis not present

## 2019-05-31 DIAGNOSIS — Z992 Dependence on renal dialysis: Secondary | ICD-10-CM | POA: Diagnosis not present

## 2019-05-31 DIAGNOSIS — D689 Coagulation defect, unspecified: Secondary | ICD-10-CM | POA: Diagnosis not present

## 2019-05-31 DIAGNOSIS — N186 End stage renal disease: Secondary | ICD-10-CM | POA: Diagnosis not present

## 2019-06-03 DIAGNOSIS — N186 End stage renal disease: Secondary | ICD-10-CM | POA: Diagnosis not present

## 2019-06-03 DIAGNOSIS — D689 Coagulation defect, unspecified: Secondary | ICD-10-CM | POA: Diagnosis not present

## 2019-06-03 DIAGNOSIS — E1129 Type 2 diabetes mellitus with other diabetic kidney complication: Secondary | ICD-10-CM | POA: Diagnosis not present

## 2019-06-03 DIAGNOSIS — N2581 Secondary hyperparathyroidism of renal origin: Secondary | ICD-10-CM | POA: Diagnosis not present

## 2019-06-03 DIAGNOSIS — Z992 Dependence on renal dialysis: Secondary | ICD-10-CM | POA: Diagnosis not present

## 2019-06-03 DIAGNOSIS — D509 Iron deficiency anemia, unspecified: Secondary | ICD-10-CM | POA: Diagnosis not present

## 2019-06-03 DIAGNOSIS — T8249XA Other complication of vascular dialysis catheter, initial encounter: Secondary | ICD-10-CM | POA: Diagnosis not present

## 2019-06-05 DIAGNOSIS — Z992 Dependence on renal dialysis: Secondary | ICD-10-CM | POA: Diagnosis not present

## 2019-06-05 DIAGNOSIS — N2581 Secondary hyperparathyroidism of renal origin: Secondary | ICD-10-CM | POA: Diagnosis not present

## 2019-06-05 DIAGNOSIS — T8249XA Other complication of vascular dialysis catheter, initial encounter: Secondary | ICD-10-CM | POA: Diagnosis not present

## 2019-06-05 DIAGNOSIS — D509 Iron deficiency anemia, unspecified: Secondary | ICD-10-CM | POA: Diagnosis not present

## 2019-06-05 DIAGNOSIS — E1129 Type 2 diabetes mellitus with other diabetic kidney complication: Secondary | ICD-10-CM | POA: Diagnosis not present

## 2019-06-05 DIAGNOSIS — Z01818 Encounter for other preprocedural examination: Secondary | ICD-10-CM | POA: Diagnosis not present

## 2019-06-05 DIAGNOSIS — Z20822 Contact with and (suspected) exposure to covid-19: Secondary | ICD-10-CM | POA: Diagnosis not present

## 2019-06-05 DIAGNOSIS — Z01812 Encounter for preprocedural laboratory examination: Secondary | ICD-10-CM | POA: Diagnosis not present

## 2019-06-05 DIAGNOSIS — Z4902 Encounter for fitting and adjustment of peritoneal dialysis catheter: Secondary | ICD-10-CM | POA: Diagnosis not present

## 2019-06-05 DIAGNOSIS — N186 End stage renal disease: Secondary | ICD-10-CM | POA: Diagnosis not present

## 2019-06-05 DIAGNOSIS — Z01810 Encounter for preprocedural cardiovascular examination: Secondary | ICD-10-CM | POA: Diagnosis not present

## 2019-06-05 DIAGNOSIS — I420 Dilated cardiomyopathy: Secondary | ICD-10-CM | POA: Insufficient documentation

## 2019-06-05 DIAGNOSIS — D689 Coagulation defect, unspecified: Secondary | ICD-10-CM | POA: Diagnosis not present

## 2019-06-07 DIAGNOSIS — Z992 Dependence on renal dialysis: Secondary | ICD-10-CM | POA: Diagnosis not present

## 2019-06-07 DIAGNOSIS — D689 Coagulation defect, unspecified: Secondary | ICD-10-CM | POA: Diagnosis not present

## 2019-06-07 DIAGNOSIS — N2581 Secondary hyperparathyroidism of renal origin: Secondary | ICD-10-CM | POA: Diagnosis not present

## 2019-06-07 DIAGNOSIS — T8249XA Other complication of vascular dialysis catheter, initial encounter: Secondary | ICD-10-CM | POA: Diagnosis not present

## 2019-06-07 DIAGNOSIS — E1129 Type 2 diabetes mellitus with other diabetic kidney complication: Secondary | ICD-10-CM | POA: Diagnosis not present

## 2019-06-07 DIAGNOSIS — D509 Iron deficiency anemia, unspecified: Secondary | ICD-10-CM | POA: Diagnosis not present

## 2019-06-07 DIAGNOSIS — N186 End stage renal disease: Secondary | ICD-10-CM | POA: Diagnosis not present

## 2019-06-10 DIAGNOSIS — Z992 Dependence on renal dialysis: Secondary | ICD-10-CM | POA: Diagnosis not present

## 2019-06-10 DIAGNOSIS — T8249XA Other complication of vascular dialysis catheter, initial encounter: Secondary | ICD-10-CM | POA: Diagnosis not present

## 2019-06-10 DIAGNOSIS — N186 End stage renal disease: Secondary | ICD-10-CM | POA: Diagnosis not present

## 2019-06-10 DIAGNOSIS — N2581 Secondary hyperparathyroidism of renal origin: Secondary | ICD-10-CM | POA: Diagnosis not present

## 2019-06-11 DIAGNOSIS — K660 Peritoneal adhesions (postprocedural) (postinfection): Secondary | ICD-10-CM | POA: Diagnosis not present

## 2019-06-11 DIAGNOSIS — Z9989 Dependence on other enabling machines and devices: Secondary | ICD-10-CM | POA: Diagnosis not present

## 2019-06-11 DIAGNOSIS — N186 End stage renal disease: Secondary | ICD-10-CM | POA: Diagnosis not present

## 2019-06-11 DIAGNOSIS — K429 Umbilical hernia without obstruction or gangrene: Secondary | ICD-10-CM | POA: Diagnosis not present

## 2019-06-11 DIAGNOSIS — Z6841 Body Mass Index (BMI) 40.0 and over, adult: Secondary | ICD-10-CM | POA: Diagnosis not present

## 2019-06-11 DIAGNOSIS — Z87891 Personal history of nicotine dependence: Secondary | ICD-10-CM | POA: Diagnosis not present

## 2019-06-11 DIAGNOSIS — Z4902 Encounter for fitting and adjustment of peritoneal dialysis catheter: Secondary | ICD-10-CM | POA: Diagnosis not present

## 2019-06-11 DIAGNOSIS — I120 Hypertensive chronic kidney disease with stage 5 chronic kidney disease or end stage renal disease: Secondary | ICD-10-CM | POA: Diagnosis not present

## 2019-06-11 DIAGNOSIS — G4733 Obstructive sleep apnea (adult) (pediatric): Secondary | ICD-10-CM | POA: Diagnosis not present

## 2019-06-11 DIAGNOSIS — Z992 Dependence on renal dialysis: Secondary | ICD-10-CM | POA: Diagnosis not present

## 2019-06-11 DIAGNOSIS — N185 Chronic kidney disease, stage 5: Secondary | ICD-10-CM | POA: Diagnosis not present

## 2019-06-12 DIAGNOSIS — N186 End stage renal disease: Secondary | ICD-10-CM | POA: Diagnosis not present

## 2019-06-12 DIAGNOSIS — N2581 Secondary hyperparathyroidism of renal origin: Secondary | ICD-10-CM | POA: Diagnosis not present

## 2019-06-12 DIAGNOSIS — T8249XA Other complication of vascular dialysis catheter, initial encounter: Secondary | ICD-10-CM | POA: Diagnosis not present

## 2019-06-12 DIAGNOSIS — Z992 Dependence on renal dialysis: Secondary | ICD-10-CM | POA: Diagnosis not present

## 2019-06-14 DIAGNOSIS — Z992 Dependence on renal dialysis: Secondary | ICD-10-CM | POA: Diagnosis not present

## 2019-06-14 DIAGNOSIS — N2581 Secondary hyperparathyroidism of renal origin: Secondary | ICD-10-CM | POA: Diagnosis not present

## 2019-06-14 DIAGNOSIS — T8249XA Other complication of vascular dialysis catheter, initial encounter: Secondary | ICD-10-CM | POA: Diagnosis not present

## 2019-06-14 DIAGNOSIS — N186 End stage renal disease: Secondary | ICD-10-CM | POA: Diagnosis not present

## 2019-06-16 DIAGNOSIS — E1122 Type 2 diabetes mellitus with diabetic chronic kidney disease: Secondary | ICD-10-CM | POA: Diagnosis not present

## 2019-06-16 DIAGNOSIS — Z992 Dependence on renal dialysis: Secondary | ICD-10-CM | POA: Diagnosis not present

## 2019-06-16 DIAGNOSIS — N186 End stage renal disease: Secondary | ICD-10-CM | POA: Diagnosis not present

## 2019-06-17 DIAGNOSIS — T8249XA Other complication of vascular dialysis catheter, initial encounter: Secondary | ICD-10-CM | POA: Diagnosis not present

## 2019-06-17 DIAGNOSIS — Z992 Dependence on renal dialysis: Secondary | ICD-10-CM | POA: Diagnosis not present

## 2019-06-17 DIAGNOSIS — D689 Coagulation defect, unspecified: Secondary | ICD-10-CM | POA: Diagnosis not present

## 2019-06-17 DIAGNOSIS — N186 End stage renal disease: Secondary | ICD-10-CM | POA: Diagnosis not present

## 2019-06-17 DIAGNOSIS — N2581 Secondary hyperparathyroidism of renal origin: Secondary | ICD-10-CM | POA: Diagnosis not present

## 2019-06-19 DIAGNOSIS — Z992 Dependence on renal dialysis: Secondary | ICD-10-CM | POA: Diagnosis not present

## 2019-06-19 DIAGNOSIS — D689 Coagulation defect, unspecified: Secondary | ICD-10-CM | POA: Diagnosis not present

## 2019-06-19 DIAGNOSIS — N2581 Secondary hyperparathyroidism of renal origin: Secondary | ICD-10-CM | POA: Diagnosis not present

## 2019-06-19 DIAGNOSIS — T8249XA Other complication of vascular dialysis catheter, initial encounter: Secondary | ICD-10-CM | POA: Diagnosis not present

## 2019-06-19 DIAGNOSIS — N186 End stage renal disease: Secondary | ICD-10-CM | POA: Diagnosis not present

## 2019-06-21 DIAGNOSIS — D689 Coagulation defect, unspecified: Secondary | ICD-10-CM | POA: Diagnosis not present

## 2019-06-21 DIAGNOSIS — N186 End stage renal disease: Secondary | ICD-10-CM | POA: Diagnosis not present

## 2019-06-21 DIAGNOSIS — N2581 Secondary hyperparathyroidism of renal origin: Secondary | ICD-10-CM | POA: Diagnosis not present

## 2019-06-21 DIAGNOSIS — T8249XA Other complication of vascular dialysis catheter, initial encounter: Secondary | ICD-10-CM | POA: Diagnosis not present

## 2019-06-21 DIAGNOSIS — Z992 Dependence on renal dialysis: Secondary | ICD-10-CM | POA: Diagnosis not present

## 2019-06-24 DIAGNOSIS — D631 Anemia in chronic kidney disease: Secondary | ICD-10-CM | POA: Diagnosis not present

## 2019-06-24 DIAGNOSIS — N186 End stage renal disease: Secondary | ICD-10-CM | POA: Diagnosis not present

## 2019-06-24 DIAGNOSIS — T8249XA Other complication of vascular dialysis catheter, initial encounter: Secondary | ICD-10-CM | POA: Diagnosis not present

## 2019-06-24 DIAGNOSIS — Z992 Dependence on renal dialysis: Secondary | ICD-10-CM | POA: Diagnosis not present

## 2019-06-24 DIAGNOSIS — N2581 Secondary hyperparathyroidism of renal origin: Secondary | ICD-10-CM | POA: Diagnosis not present

## 2019-06-24 DIAGNOSIS — D689 Coagulation defect, unspecified: Secondary | ICD-10-CM | POA: Diagnosis not present

## 2019-06-26 DIAGNOSIS — T8249XA Other complication of vascular dialysis catheter, initial encounter: Secondary | ICD-10-CM | POA: Diagnosis not present

## 2019-06-26 DIAGNOSIS — D631 Anemia in chronic kidney disease: Secondary | ICD-10-CM | POA: Diagnosis not present

## 2019-06-26 DIAGNOSIS — N2581 Secondary hyperparathyroidism of renal origin: Secondary | ICD-10-CM | POA: Diagnosis not present

## 2019-06-26 DIAGNOSIS — D689 Coagulation defect, unspecified: Secondary | ICD-10-CM | POA: Diagnosis not present

## 2019-06-26 DIAGNOSIS — N186 End stage renal disease: Secondary | ICD-10-CM | POA: Diagnosis not present

## 2019-06-26 DIAGNOSIS — Z992 Dependence on renal dialysis: Secondary | ICD-10-CM | POA: Diagnosis not present

## 2019-06-28 DIAGNOSIS — N2581 Secondary hyperparathyroidism of renal origin: Secondary | ICD-10-CM | POA: Diagnosis not present

## 2019-06-28 DIAGNOSIS — D631 Anemia in chronic kidney disease: Secondary | ICD-10-CM | POA: Diagnosis not present

## 2019-06-28 DIAGNOSIS — Z23 Encounter for immunization: Secondary | ICD-10-CM | POA: Diagnosis not present

## 2019-06-28 DIAGNOSIS — Z992 Dependence on renal dialysis: Secondary | ICD-10-CM | POA: Diagnosis not present

## 2019-06-28 DIAGNOSIS — D689 Coagulation defect, unspecified: Secondary | ICD-10-CM | POA: Diagnosis not present

## 2019-06-28 DIAGNOSIS — N186 End stage renal disease: Secondary | ICD-10-CM | POA: Diagnosis not present

## 2019-06-28 DIAGNOSIS — T8249XA Other complication of vascular dialysis catheter, initial encounter: Secondary | ICD-10-CM | POA: Diagnosis not present

## 2019-07-01 DIAGNOSIS — Z992 Dependence on renal dialysis: Secondary | ICD-10-CM | POA: Diagnosis not present

## 2019-07-01 DIAGNOSIS — T8249XA Other complication of vascular dialysis catheter, initial encounter: Secondary | ICD-10-CM | POA: Diagnosis not present

## 2019-07-01 DIAGNOSIS — N2581 Secondary hyperparathyroidism of renal origin: Secondary | ICD-10-CM | POA: Diagnosis not present

## 2019-07-01 DIAGNOSIS — D689 Coagulation defect, unspecified: Secondary | ICD-10-CM | POA: Diagnosis not present

## 2019-07-01 DIAGNOSIS — E1129 Type 2 diabetes mellitus with other diabetic kidney complication: Secondary | ICD-10-CM | POA: Diagnosis not present

## 2019-07-01 DIAGNOSIS — N186 End stage renal disease: Secondary | ICD-10-CM | POA: Diagnosis not present

## 2019-07-03 DIAGNOSIS — T8249XA Other complication of vascular dialysis catheter, initial encounter: Secondary | ICD-10-CM | POA: Diagnosis not present

## 2019-07-03 DIAGNOSIS — E1129 Type 2 diabetes mellitus with other diabetic kidney complication: Secondary | ICD-10-CM | POA: Diagnosis not present

## 2019-07-03 DIAGNOSIS — Z992 Dependence on renal dialysis: Secondary | ICD-10-CM | POA: Diagnosis not present

## 2019-07-03 DIAGNOSIS — N186 End stage renal disease: Secondary | ICD-10-CM | POA: Diagnosis not present

## 2019-07-03 DIAGNOSIS — D689 Coagulation defect, unspecified: Secondary | ICD-10-CM | POA: Diagnosis not present

## 2019-07-03 DIAGNOSIS — N2581 Secondary hyperparathyroidism of renal origin: Secondary | ICD-10-CM | POA: Diagnosis not present

## 2019-07-05 DIAGNOSIS — D689 Coagulation defect, unspecified: Secondary | ICD-10-CM | POA: Diagnosis not present

## 2019-07-05 DIAGNOSIS — Z992 Dependence on renal dialysis: Secondary | ICD-10-CM | POA: Diagnosis not present

## 2019-07-05 DIAGNOSIS — E1129 Type 2 diabetes mellitus with other diabetic kidney complication: Secondary | ICD-10-CM | POA: Diagnosis not present

## 2019-07-05 DIAGNOSIS — N2581 Secondary hyperparathyroidism of renal origin: Secondary | ICD-10-CM | POA: Diagnosis not present

## 2019-07-05 DIAGNOSIS — N186 End stage renal disease: Secondary | ICD-10-CM | POA: Diagnosis not present

## 2019-07-05 DIAGNOSIS — T8249XA Other complication of vascular dialysis catheter, initial encounter: Secondary | ICD-10-CM | POA: Diagnosis not present

## 2019-07-08 DIAGNOSIS — T8249XA Other complication of vascular dialysis catheter, initial encounter: Secondary | ICD-10-CM | POA: Diagnosis not present

## 2019-07-08 DIAGNOSIS — Z992 Dependence on renal dialysis: Secondary | ICD-10-CM | POA: Diagnosis not present

## 2019-07-08 DIAGNOSIS — E878 Other disorders of electrolyte and fluid balance, not elsewhere classified: Secondary | ICD-10-CM | POA: Insufficient documentation

## 2019-07-08 DIAGNOSIS — N186 End stage renal disease: Secondary | ICD-10-CM | POA: Diagnosis not present

## 2019-07-08 DIAGNOSIS — N2581 Secondary hyperparathyroidism of renal origin: Secondary | ICD-10-CM | POA: Diagnosis not present

## 2019-07-09 DIAGNOSIS — E1129 Type 2 diabetes mellitus with other diabetic kidney complication: Secondary | ICD-10-CM | POA: Diagnosis not present

## 2019-07-09 DIAGNOSIS — Z111 Encounter for screening for respiratory tuberculosis: Secondary | ICD-10-CM | POA: Diagnosis not present

## 2019-07-09 DIAGNOSIS — Z992 Dependence on renal dialysis: Secondary | ICD-10-CM | POA: Diagnosis not present

## 2019-07-09 DIAGNOSIS — N2581 Secondary hyperparathyroidism of renal origin: Secondary | ICD-10-CM | POA: Diagnosis not present

## 2019-07-09 DIAGNOSIS — E785 Hyperlipidemia, unspecified: Secondary | ICD-10-CM | POA: Diagnosis not present

## 2019-07-09 DIAGNOSIS — N186 End stage renal disease: Secondary | ICD-10-CM | POA: Diagnosis not present

## 2019-07-10 DIAGNOSIS — Z111 Encounter for screening for respiratory tuberculosis: Secondary | ICD-10-CM | POA: Diagnosis not present

## 2019-07-10 DIAGNOSIS — N2581 Secondary hyperparathyroidism of renal origin: Secondary | ICD-10-CM | POA: Diagnosis not present

## 2019-07-10 DIAGNOSIS — E785 Hyperlipidemia, unspecified: Secondary | ICD-10-CM | POA: Diagnosis not present

## 2019-07-10 DIAGNOSIS — E1129 Type 2 diabetes mellitus with other diabetic kidney complication: Secondary | ICD-10-CM | POA: Diagnosis not present

## 2019-07-10 DIAGNOSIS — N186 End stage renal disease: Secondary | ICD-10-CM | POA: Diagnosis not present

## 2019-07-10 DIAGNOSIS — Z992 Dependence on renal dialysis: Secondary | ICD-10-CM | POA: Diagnosis not present

## 2019-07-11 DIAGNOSIS — E785 Hyperlipidemia, unspecified: Secondary | ICD-10-CM | POA: Diagnosis not present

## 2019-07-11 DIAGNOSIS — E1129 Type 2 diabetes mellitus with other diabetic kidney complication: Secondary | ICD-10-CM | POA: Diagnosis not present

## 2019-07-11 DIAGNOSIS — N186 End stage renal disease: Secondary | ICD-10-CM | POA: Diagnosis not present

## 2019-07-11 DIAGNOSIS — Z992 Dependence on renal dialysis: Secondary | ICD-10-CM | POA: Diagnosis not present

## 2019-07-11 DIAGNOSIS — Z111 Encounter for screening for respiratory tuberculosis: Secondary | ICD-10-CM | POA: Diagnosis not present

## 2019-07-11 DIAGNOSIS — N2581 Secondary hyperparathyroidism of renal origin: Secondary | ICD-10-CM | POA: Diagnosis not present

## 2019-07-12 DIAGNOSIS — N186 End stage renal disease: Secondary | ICD-10-CM | POA: Diagnosis not present

## 2019-07-12 DIAGNOSIS — Z992 Dependence on renal dialysis: Secondary | ICD-10-CM | POA: Diagnosis not present

## 2019-07-12 DIAGNOSIS — N2581 Secondary hyperparathyroidism of renal origin: Secondary | ICD-10-CM | POA: Diagnosis not present

## 2019-07-14 DIAGNOSIS — E1122 Type 2 diabetes mellitus with diabetic chronic kidney disease: Secondary | ICD-10-CM | POA: Diagnosis not present

## 2019-07-14 DIAGNOSIS — Z992 Dependence on renal dialysis: Secondary | ICD-10-CM | POA: Diagnosis not present

## 2019-07-14 DIAGNOSIS — N186 End stage renal disease: Secondary | ICD-10-CM | POA: Diagnosis not present

## 2019-07-15 DIAGNOSIS — N186 End stage renal disease: Secondary | ICD-10-CM | POA: Diagnosis not present

## 2019-07-15 DIAGNOSIS — N2581 Secondary hyperparathyroidism of renal origin: Secondary | ICD-10-CM | POA: Diagnosis not present

## 2019-07-15 DIAGNOSIS — Z992 Dependence on renal dialysis: Secondary | ICD-10-CM | POA: Diagnosis not present

## 2019-07-16 DIAGNOSIS — N2581 Secondary hyperparathyroidism of renal origin: Secondary | ICD-10-CM | POA: Diagnosis not present

## 2019-07-16 DIAGNOSIS — Z992 Dependence on renal dialysis: Secondary | ICD-10-CM | POA: Diagnosis not present

## 2019-07-16 DIAGNOSIS — N186 End stage renal disease: Secondary | ICD-10-CM | POA: Diagnosis not present

## 2019-07-17 DIAGNOSIS — Z992 Dependence on renal dialysis: Secondary | ICD-10-CM | POA: Diagnosis not present

## 2019-07-17 DIAGNOSIS — N2581 Secondary hyperparathyroidism of renal origin: Secondary | ICD-10-CM | POA: Diagnosis not present

## 2019-07-17 DIAGNOSIS — N186 End stage renal disease: Secondary | ICD-10-CM | POA: Diagnosis not present

## 2019-07-18 DIAGNOSIS — Z992 Dependence on renal dialysis: Secondary | ICD-10-CM | POA: Diagnosis not present

## 2019-07-18 DIAGNOSIS — N186 End stage renal disease: Secondary | ICD-10-CM | POA: Diagnosis not present

## 2019-07-18 DIAGNOSIS — N2581 Secondary hyperparathyroidism of renal origin: Secondary | ICD-10-CM | POA: Diagnosis not present

## 2019-07-19 DIAGNOSIS — Z992 Dependence on renal dialysis: Secondary | ICD-10-CM | POA: Diagnosis not present

## 2019-07-19 DIAGNOSIS — N186 End stage renal disease: Secondary | ICD-10-CM | POA: Diagnosis not present

## 2019-07-19 DIAGNOSIS — N2581 Secondary hyperparathyroidism of renal origin: Secondary | ICD-10-CM | POA: Diagnosis not present

## 2019-07-20 DIAGNOSIS — Z992 Dependence on renal dialysis: Secondary | ICD-10-CM | POA: Diagnosis not present

## 2019-07-20 DIAGNOSIS — N186 End stage renal disease: Secondary | ICD-10-CM | POA: Diagnosis not present

## 2019-07-20 DIAGNOSIS — N2581 Secondary hyperparathyroidism of renal origin: Secondary | ICD-10-CM | POA: Diagnosis not present

## 2019-07-21 DIAGNOSIS — N186 End stage renal disease: Secondary | ICD-10-CM | POA: Diagnosis not present

## 2019-07-21 DIAGNOSIS — Z992 Dependence on renal dialysis: Secondary | ICD-10-CM | POA: Diagnosis not present

## 2019-07-21 DIAGNOSIS — N2581 Secondary hyperparathyroidism of renal origin: Secondary | ICD-10-CM | POA: Diagnosis not present

## 2019-07-22 DIAGNOSIS — N2581 Secondary hyperparathyroidism of renal origin: Secondary | ICD-10-CM | POA: Diagnosis not present

## 2019-07-22 DIAGNOSIS — N186 End stage renal disease: Secondary | ICD-10-CM | POA: Diagnosis not present

## 2019-07-22 DIAGNOSIS — Z992 Dependence on renal dialysis: Secondary | ICD-10-CM | POA: Diagnosis not present

## 2019-07-23 DIAGNOSIS — N2581 Secondary hyperparathyroidism of renal origin: Secondary | ICD-10-CM | POA: Diagnosis not present

## 2019-07-23 DIAGNOSIS — Z992 Dependence on renal dialysis: Secondary | ICD-10-CM | POA: Diagnosis not present

## 2019-07-23 DIAGNOSIS — N186 End stage renal disease: Secondary | ICD-10-CM | POA: Diagnosis not present

## 2019-07-24 DIAGNOSIS — Z992 Dependence on renal dialysis: Secondary | ICD-10-CM | POA: Diagnosis not present

## 2019-07-24 DIAGNOSIS — N186 End stage renal disease: Secondary | ICD-10-CM | POA: Diagnosis not present

## 2019-07-24 DIAGNOSIS — N2581 Secondary hyperparathyroidism of renal origin: Secondary | ICD-10-CM | POA: Diagnosis not present

## 2019-07-25 DIAGNOSIS — I10 Essential (primary) hypertension: Secondary | ICD-10-CM | POA: Diagnosis not present

## 2019-07-25 DIAGNOSIS — N2581 Secondary hyperparathyroidism of renal origin: Secondary | ICD-10-CM | POA: Diagnosis not present

## 2019-07-25 DIAGNOSIS — D631 Anemia in chronic kidney disease: Secondary | ICD-10-CM | POA: Diagnosis not present

## 2019-07-25 DIAGNOSIS — N186 End stage renal disease: Secondary | ICD-10-CM | POA: Diagnosis not present

## 2019-07-25 DIAGNOSIS — Z992 Dependence on renal dialysis: Secondary | ICD-10-CM | POA: Diagnosis not present

## 2019-07-26 DIAGNOSIS — N186 End stage renal disease: Secondary | ICD-10-CM | POA: Diagnosis not present

## 2019-07-26 DIAGNOSIS — Z992 Dependence on renal dialysis: Secondary | ICD-10-CM | POA: Diagnosis not present

## 2019-07-26 DIAGNOSIS — N2581 Secondary hyperparathyroidism of renal origin: Secondary | ICD-10-CM | POA: Diagnosis not present

## 2019-07-26 DIAGNOSIS — Z23 Encounter for immunization: Secondary | ICD-10-CM | POA: Diagnosis not present

## 2019-07-27 DIAGNOSIS — Z992 Dependence on renal dialysis: Secondary | ICD-10-CM | POA: Diagnosis not present

## 2019-07-27 DIAGNOSIS — N2581 Secondary hyperparathyroidism of renal origin: Secondary | ICD-10-CM | POA: Diagnosis not present

## 2019-07-27 DIAGNOSIS — N186 End stage renal disease: Secondary | ICD-10-CM | POA: Diagnosis not present

## 2019-07-28 DIAGNOSIS — N2581 Secondary hyperparathyroidism of renal origin: Secondary | ICD-10-CM | POA: Diagnosis not present

## 2019-07-28 DIAGNOSIS — N186 End stage renal disease: Secondary | ICD-10-CM | POA: Diagnosis not present

## 2019-07-28 DIAGNOSIS — Z992 Dependence on renal dialysis: Secondary | ICD-10-CM | POA: Diagnosis not present

## 2019-07-29 DIAGNOSIS — Z992 Dependence on renal dialysis: Secondary | ICD-10-CM | POA: Diagnosis not present

## 2019-07-29 DIAGNOSIS — N2581 Secondary hyperparathyroidism of renal origin: Secondary | ICD-10-CM | POA: Diagnosis not present

## 2019-07-29 DIAGNOSIS — N186 End stage renal disease: Secondary | ICD-10-CM | POA: Diagnosis not present

## 2019-07-30 DIAGNOSIS — Z992 Dependence on renal dialysis: Secondary | ICD-10-CM | POA: Diagnosis not present

## 2019-07-30 DIAGNOSIS — N186 End stage renal disease: Secondary | ICD-10-CM | POA: Diagnosis not present

## 2019-07-30 DIAGNOSIS — N2581 Secondary hyperparathyroidism of renal origin: Secondary | ICD-10-CM | POA: Diagnosis not present

## 2019-07-31 DIAGNOSIS — N2581 Secondary hyperparathyroidism of renal origin: Secondary | ICD-10-CM | POA: Diagnosis not present

## 2019-07-31 DIAGNOSIS — Z992 Dependence on renal dialysis: Secondary | ICD-10-CM | POA: Diagnosis not present

## 2019-07-31 DIAGNOSIS — N186 End stage renal disease: Secondary | ICD-10-CM | POA: Diagnosis not present

## 2019-08-01 DIAGNOSIS — Z992 Dependence on renal dialysis: Secondary | ICD-10-CM | POA: Diagnosis not present

## 2019-08-01 DIAGNOSIS — N2581 Secondary hyperparathyroidism of renal origin: Secondary | ICD-10-CM | POA: Diagnosis not present

## 2019-08-01 DIAGNOSIS — N186 End stage renal disease: Secondary | ICD-10-CM | POA: Diagnosis not present

## 2019-08-02 DIAGNOSIS — Z992 Dependence on renal dialysis: Secondary | ICD-10-CM | POA: Diagnosis not present

## 2019-08-02 DIAGNOSIS — N186 End stage renal disease: Secondary | ICD-10-CM | POA: Diagnosis not present

## 2019-08-02 DIAGNOSIS — N2581 Secondary hyperparathyroidism of renal origin: Secondary | ICD-10-CM | POA: Diagnosis not present

## 2019-08-03 DIAGNOSIS — Z992 Dependence on renal dialysis: Secondary | ICD-10-CM | POA: Diagnosis not present

## 2019-08-03 DIAGNOSIS — N186 End stage renal disease: Secondary | ICD-10-CM | POA: Diagnosis not present

## 2019-08-03 DIAGNOSIS — N2581 Secondary hyperparathyroidism of renal origin: Secondary | ICD-10-CM | POA: Diagnosis not present

## 2019-08-04 DIAGNOSIS — N186 End stage renal disease: Secondary | ICD-10-CM | POA: Diagnosis not present

## 2019-08-04 DIAGNOSIS — N2581 Secondary hyperparathyroidism of renal origin: Secondary | ICD-10-CM | POA: Diagnosis not present

## 2019-08-04 DIAGNOSIS — Z992 Dependence on renal dialysis: Secondary | ICD-10-CM | POA: Diagnosis not present

## 2019-08-05 DIAGNOSIS — Z992 Dependence on renal dialysis: Secondary | ICD-10-CM | POA: Diagnosis not present

## 2019-08-05 DIAGNOSIS — N186 End stage renal disease: Secondary | ICD-10-CM | POA: Diagnosis not present

## 2019-08-05 DIAGNOSIS — N2581 Secondary hyperparathyroidism of renal origin: Secondary | ICD-10-CM | POA: Diagnosis not present

## 2019-08-06 DIAGNOSIS — Z992 Dependence on renal dialysis: Secondary | ICD-10-CM | POA: Diagnosis not present

## 2019-08-06 DIAGNOSIS — N2581 Secondary hyperparathyroidism of renal origin: Secondary | ICD-10-CM | POA: Diagnosis not present

## 2019-08-06 DIAGNOSIS — N186 End stage renal disease: Secondary | ICD-10-CM | POA: Diagnosis not present

## 2019-08-07 DIAGNOSIS — N2581 Secondary hyperparathyroidism of renal origin: Secondary | ICD-10-CM | POA: Diagnosis not present

## 2019-08-07 DIAGNOSIS — Z992 Dependence on renal dialysis: Secondary | ICD-10-CM | POA: Diagnosis not present

## 2019-08-07 DIAGNOSIS — N186 End stage renal disease: Secondary | ICD-10-CM | POA: Diagnosis not present

## 2019-08-08 DIAGNOSIS — N2581 Secondary hyperparathyroidism of renal origin: Secondary | ICD-10-CM | POA: Diagnosis not present

## 2019-08-08 DIAGNOSIS — N186 End stage renal disease: Secondary | ICD-10-CM | POA: Diagnosis not present

## 2019-08-08 DIAGNOSIS — Z992 Dependence on renal dialysis: Secondary | ICD-10-CM | POA: Diagnosis not present

## 2019-08-09 DIAGNOSIS — N186 End stage renal disease: Secondary | ICD-10-CM | POA: Diagnosis not present

## 2019-08-09 DIAGNOSIS — N2581 Secondary hyperparathyroidism of renal origin: Secondary | ICD-10-CM | POA: Diagnosis not present

## 2019-08-09 DIAGNOSIS — Z992 Dependence on renal dialysis: Secondary | ICD-10-CM | POA: Diagnosis not present

## 2019-08-10 DIAGNOSIS — Z992 Dependence on renal dialysis: Secondary | ICD-10-CM | POA: Diagnosis not present

## 2019-08-10 DIAGNOSIS — N186 End stage renal disease: Secondary | ICD-10-CM | POA: Diagnosis not present

## 2019-08-10 DIAGNOSIS — N2581 Secondary hyperparathyroidism of renal origin: Secondary | ICD-10-CM | POA: Diagnosis not present

## 2019-08-11 DIAGNOSIS — N2581 Secondary hyperparathyroidism of renal origin: Secondary | ICD-10-CM | POA: Diagnosis not present

## 2019-08-11 DIAGNOSIS — N186 End stage renal disease: Secondary | ICD-10-CM | POA: Diagnosis not present

## 2019-08-11 DIAGNOSIS — Z992 Dependence on renal dialysis: Secondary | ICD-10-CM | POA: Diagnosis not present

## 2019-08-12 DIAGNOSIS — N186 End stage renal disease: Secondary | ICD-10-CM | POA: Diagnosis not present

## 2019-08-12 DIAGNOSIS — Z992 Dependence on renal dialysis: Secondary | ICD-10-CM | POA: Diagnosis not present

## 2019-08-12 DIAGNOSIS — N2581 Secondary hyperparathyroidism of renal origin: Secondary | ICD-10-CM | POA: Diagnosis not present

## 2019-08-13 DIAGNOSIS — Z992 Dependence on renal dialysis: Secondary | ICD-10-CM | POA: Diagnosis not present

## 2019-08-13 DIAGNOSIS — N186 End stage renal disease: Secondary | ICD-10-CM | POA: Diagnosis not present

## 2019-08-13 DIAGNOSIS — N2581 Secondary hyperparathyroidism of renal origin: Secondary | ICD-10-CM | POA: Diagnosis not present

## 2019-08-14 DIAGNOSIS — N2581 Secondary hyperparathyroidism of renal origin: Secondary | ICD-10-CM | POA: Diagnosis not present

## 2019-08-14 DIAGNOSIS — N186 End stage renal disease: Secondary | ICD-10-CM | POA: Diagnosis not present

## 2019-08-14 DIAGNOSIS — E1122 Type 2 diabetes mellitus with diabetic chronic kidney disease: Secondary | ICD-10-CM | POA: Diagnosis not present

## 2019-08-14 DIAGNOSIS — Z992 Dependence on renal dialysis: Secondary | ICD-10-CM | POA: Diagnosis not present

## 2019-08-15 DIAGNOSIS — N186 End stage renal disease: Secondary | ICD-10-CM | POA: Diagnosis not present

## 2019-08-15 DIAGNOSIS — N2581 Secondary hyperparathyroidism of renal origin: Secondary | ICD-10-CM | POA: Diagnosis not present

## 2019-08-15 DIAGNOSIS — Z992 Dependence on renal dialysis: Secondary | ICD-10-CM | POA: Diagnosis not present

## 2019-08-16 DIAGNOSIS — N2581 Secondary hyperparathyroidism of renal origin: Secondary | ICD-10-CM | POA: Diagnosis not present

## 2019-08-16 DIAGNOSIS — N186 End stage renal disease: Secondary | ICD-10-CM | POA: Diagnosis not present

## 2019-08-16 DIAGNOSIS — Z992 Dependence on renal dialysis: Secondary | ICD-10-CM | POA: Diagnosis not present

## 2019-08-17 DIAGNOSIS — Z992 Dependence on renal dialysis: Secondary | ICD-10-CM | POA: Diagnosis not present

## 2019-08-17 DIAGNOSIS — N2581 Secondary hyperparathyroidism of renal origin: Secondary | ICD-10-CM | POA: Diagnosis not present

## 2019-08-17 DIAGNOSIS — N186 End stage renal disease: Secondary | ICD-10-CM | POA: Diagnosis not present

## 2019-08-18 DIAGNOSIS — N186 End stage renal disease: Secondary | ICD-10-CM | POA: Diagnosis not present

## 2019-08-18 DIAGNOSIS — Z992 Dependence on renal dialysis: Secondary | ICD-10-CM | POA: Diagnosis not present

## 2019-08-18 DIAGNOSIS — N2581 Secondary hyperparathyroidism of renal origin: Secondary | ICD-10-CM | POA: Diagnosis not present

## 2019-08-19 DIAGNOSIS — N2581 Secondary hyperparathyroidism of renal origin: Secondary | ICD-10-CM | POA: Diagnosis not present

## 2019-08-19 DIAGNOSIS — Z992 Dependence on renal dialysis: Secondary | ICD-10-CM | POA: Diagnosis not present

## 2019-08-19 DIAGNOSIS — N186 End stage renal disease: Secondary | ICD-10-CM | POA: Diagnosis not present

## 2019-08-20 DIAGNOSIS — N2581 Secondary hyperparathyroidism of renal origin: Secondary | ICD-10-CM | POA: Diagnosis not present

## 2019-08-20 DIAGNOSIS — Z992 Dependence on renal dialysis: Secondary | ICD-10-CM | POA: Diagnosis not present

## 2019-08-20 DIAGNOSIS — N186 End stage renal disease: Secondary | ICD-10-CM | POA: Diagnosis not present

## 2019-08-21 DIAGNOSIS — N2581 Secondary hyperparathyroidism of renal origin: Secondary | ICD-10-CM | POA: Diagnosis not present

## 2019-08-21 DIAGNOSIS — N186 End stage renal disease: Secondary | ICD-10-CM | POA: Diagnosis not present

## 2019-08-21 DIAGNOSIS — Z992 Dependence on renal dialysis: Secondary | ICD-10-CM | POA: Diagnosis not present

## 2019-08-22 DIAGNOSIS — N186 End stage renal disease: Secondary | ICD-10-CM | POA: Diagnosis not present

## 2019-08-22 DIAGNOSIS — N2581 Secondary hyperparathyroidism of renal origin: Secondary | ICD-10-CM | POA: Diagnosis not present

## 2019-08-22 DIAGNOSIS — Z992 Dependence on renal dialysis: Secondary | ICD-10-CM | POA: Diagnosis not present

## 2019-08-23 DIAGNOSIS — N186 End stage renal disease: Secondary | ICD-10-CM | POA: Diagnosis not present

## 2019-08-23 DIAGNOSIS — N2581 Secondary hyperparathyroidism of renal origin: Secondary | ICD-10-CM | POA: Diagnosis not present

## 2019-08-23 DIAGNOSIS — Z992 Dependence on renal dialysis: Secondary | ICD-10-CM | POA: Diagnosis not present

## 2019-08-24 DIAGNOSIS — Z992 Dependence on renal dialysis: Secondary | ICD-10-CM | POA: Diagnosis not present

## 2019-08-24 DIAGNOSIS — N2581 Secondary hyperparathyroidism of renal origin: Secondary | ICD-10-CM | POA: Diagnosis not present

## 2019-08-24 DIAGNOSIS — N186 End stage renal disease: Secondary | ICD-10-CM | POA: Diagnosis not present

## 2019-08-25 DIAGNOSIS — N186 End stage renal disease: Secondary | ICD-10-CM | POA: Diagnosis not present

## 2019-08-25 DIAGNOSIS — Z992 Dependence on renal dialysis: Secondary | ICD-10-CM | POA: Diagnosis not present

## 2019-08-25 DIAGNOSIS — N2581 Secondary hyperparathyroidism of renal origin: Secondary | ICD-10-CM | POA: Diagnosis not present

## 2019-08-26 DIAGNOSIS — N2581 Secondary hyperparathyroidism of renal origin: Secondary | ICD-10-CM | POA: Diagnosis not present

## 2019-08-26 DIAGNOSIS — Z992 Dependence on renal dialysis: Secondary | ICD-10-CM | POA: Diagnosis not present

## 2019-08-26 DIAGNOSIS — N186 End stage renal disease: Secondary | ICD-10-CM | POA: Diagnosis not present

## 2019-08-27 DIAGNOSIS — N186 End stage renal disease: Secondary | ICD-10-CM | POA: Diagnosis not present

## 2019-08-27 DIAGNOSIS — N2581 Secondary hyperparathyroidism of renal origin: Secondary | ICD-10-CM | POA: Diagnosis not present

## 2019-08-27 DIAGNOSIS — Z992 Dependence on renal dialysis: Secondary | ICD-10-CM | POA: Diagnosis not present

## 2019-08-28 DIAGNOSIS — Z992 Dependence on renal dialysis: Secondary | ICD-10-CM | POA: Diagnosis not present

## 2019-08-28 DIAGNOSIS — N2581 Secondary hyperparathyroidism of renal origin: Secondary | ICD-10-CM | POA: Diagnosis not present

## 2019-08-28 DIAGNOSIS — N186 End stage renal disease: Secondary | ICD-10-CM | POA: Diagnosis not present

## 2019-08-29 DIAGNOSIS — N2581 Secondary hyperparathyroidism of renal origin: Secondary | ICD-10-CM | POA: Diagnosis not present

## 2019-08-29 DIAGNOSIS — N186 End stage renal disease: Secondary | ICD-10-CM | POA: Diagnosis not present

## 2019-08-29 DIAGNOSIS — Z992 Dependence on renal dialysis: Secondary | ICD-10-CM | POA: Diagnosis not present

## 2019-08-30 DIAGNOSIS — N2581 Secondary hyperparathyroidism of renal origin: Secondary | ICD-10-CM | POA: Diagnosis not present

## 2019-08-30 DIAGNOSIS — N186 End stage renal disease: Secondary | ICD-10-CM | POA: Diagnosis not present

## 2019-08-30 DIAGNOSIS — Z992 Dependence on renal dialysis: Secondary | ICD-10-CM | POA: Diagnosis not present

## 2019-08-31 DIAGNOSIS — N186 End stage renal disease: Secondary | ICD-10-CM | POA: Diagnosis not present

## 2019-08-31 DIAGNOSIS — N2581 Secondary hyperparathyroidism of renal origin: Secondary | ICD-10-CM | POA: Diagnosis not present

## 2019-08-31 DIAGNOSIS — Z992 Dependence on renal dialysis: Secondary | ICD-10-CM | POA: Diagnosis not present

## 2019-09-01 DIAGNOSIS — N2581 Secondary hyperparathyroidism of renal origin: Secondary | ICD-10-CM | POA: Diagnosis not present

## 2019-09-01 DIAGNOSIS — N186 End stage renal disease: Secondary | ICD-10-CM | POA: Diagnosis not present

## 2019-09-01 DIAGNOSIS — Z992 Dependence on renal dialysis: Secondary | ICD-10-CM | POA: Diagnosis not present

## 2019-09-02 DIAGNOSIS — Z992 Dependence on renal dialysis: Secondary | ICD-10-CM | POA: Diagnosis not present

## 2019-09-02 DIAGNOSIS — N186 End stage renal disease: Secondary | ICD-10-CM | POA: Diagnosis not present

## 2019-09-02 DIAGNOSIS — N2581 Secondary hyperparathyroidism of renal origin: Secondary | ICD-10-CM | POA: Diagnosis not present

## 2019-09-03 DIAGNOSIS — N186 End stage renal disease: Secondary | ICD-10-CM | POA: Diagnosis not present

## 2019-09-03 DIAGNOSIS — N2581 Secondary hyperparathyroidism of renal origin: Secondary | ICD-10-CM | POA: Diagnosis not present

## 2019-09-03 DIAGNOSIS — Z992 Dependence on renal dialysis: Secondary | ICD-10-CM | POA: Diagnosis not present

## 2019-09-04 DIAGNOSIS — N2581 Secondary hyperparathyroidism of renal origin: Secondary | ICD-10-CM | POA: Diagnosis not present

## 2019-09-04 DIAGNOSIS — Z992 Dependence on renal dialysis: Secondary | ICD-10-CM | POA: Diagnosis not present

## 2019-09-04 DIAGNOSIS — N186 End stage renal disease: Secondary | ICD-10-CM | POA: Diagnosis not present

## 2019-09-05 DIAGNOSIS — N2581 Secondary hyperparathyroidism of renal origin: Secondary | ICD-10-CM | POA: Diagnosis not present

## 2019-09-05 DIAGNOSIS — Z992 Dependence on renal dialysis: Secondary | ICD-10-CM | POA: Diagnosis not present

## 2019-09-05 DIAGNOSIS — N186 End stage renal disease: Secondary | ICD-10-CM | POA: Diagnosis not present

## 2019-09-06 DIAGNOSIS — N2581 Secondary hyperparathyroidism of renal origin: Secondary | ICD-10-CM | POA: Diagnosis not present

## 2019-09-06 DIAGNOSIS — N186 End stage renal disease: Secondary | ICD-10-CM | POA: Diagnosis not present

## 2019-09-06 DIAGNOSIS — Z992 Dependence on renal dialysis: Secondary | ICD-10-CM | POA: Diagnosis not present

## 2019-09-07 DIAGNOSIS — Z992 Dependence on renal dialysis: Secondary | ICD-10-CM | POA: Diagnosis not present

## 2019-09-07 DIAGNOSIS — N186 End stage renal disease: Secondary | ICD-10-CM | POA: Diagnosis not present

## 2019-09-07 DIAGNOSIS — N2581 Secondary hyperparathyroidism of renal origin: Secondary | ICD-10-CM | POA: Diagnosis not present

## 2019-09-08 DIAGNOSIS — N2581 Secondary hyperparathyroidism of renal origin: Secondary | ICD-10-CM | POA: Diagnosis not present

## 2019-09-08 DIAGNOSIS — Z992 Dependence on renal dialysis: Secondary | ICD-10-CM | POA: Diagnosis not present

## 2019-09-08 DIAGNOSIS — N186 End stage renal disease: Secondary | ICD-10-CM | POA: Diagnosis not present

## 2019-09-09 DIAGNOSIS — N2581 Secondary hyperparathyroidism of renal origin: Secondary | ICD-10-CM | POA: Diagnosis not present

## 2019-09-09 DIAGNOSIS — N186 End stage renal disease: Secondary | ICD-10-CM | POA: Diagnosis not present

## 2019-09-09 DIAGNOSIS — Z992 Dependence on renal dialysis: Secondary | ICD-10-CM | POA: Diagnosis not present

## 2019-09-11 DIAGNOSIS — N186 End stage renal disease: Secondary | ICD-10-CM | POA: Diagnosis not present

## 2019-09-11 DIAGNOSIS — Z992 Dependence on renal dialysis: Secondary | ICD-10-CM | POA: Diagnosis not present

## 2019-09-11 DIAGNOSIS — N2581 Secondary hyperparathyroidism of renal origin: Secondary | ICD-10-CM | POA: Diagnosis not present

## 2019-09-12 DIAGNOSIS — N186 End stage renal disease: Secondary | ICD-10-CM | POA: Diagnosis not present

## 2019-09-12 DIAGNOSIS — Z992 Dependence on renal dialysis: Secondary | ICD-10-CM | POA: Diagnosis not present

## 2019-09-12 DIAGNOSIS — N2581 Secondary hyperparathyroidism of renal origin: Secondary | ICD-10-CM | POA: Diagnosis not present

## 2019-09-13 DIAGNOSIS — N2581 Secondary hyperparathyroidism of renal origin: Secondary | ICD-10-CM | POA: Diagnosis not present

## 2019-09-13 DIAGNOSIS — E1122 Type 2 diabetes mellitus with diabetic chronic kidney disease: Secondary | ICD-10-CM | POA: Diagnosis not present

## 2019-09-13 DIAGNOSIS — Z992 Dependence on renal dialysis: Secondary | ICD-10-CM | POA: Diagnosis not present

## 2019-09-13 DIAGNOSIS — N186 End stage renal disease: Secondary | ICD-10-CM | POA: Diagnosis not present

## 2019-09-14 DIAGNOSIS — Z992 Dependence on renal dialysis: Secondary | ICD-10-CM | POA: Diagnosis not present

## 2019-09-14 DIAGNOSIS — N186 End stage renal disease: Secondary | ICD-10-CM | POA: Diagnosis not present

## 2019-09-14 DIAGNOSIS — N2581 Secondary hyperparathyroidism of renal origin: Secondary | ICD-10-CM | POA: Diagnosis not present

## 2019-09-15 DIAGNOSIS — N186 End stage renal disease: Secondary | ICD-10-CM | POA: Diagnosis not present

## 2019-09-15 DIAGNOSIS — N2581 Secondary hyperparathyroidism of renal origin: Secondary | ICD-10-CM | POA: Diagnosis not present

## 2019-09-15 DIAGNOSIS — Z992 Dependence on renal dialysis: Secondary | ICD-10-CM | POA: Diagnosis not present

## 2019-09-16 DIAGNOSIS — N2581 Secondary hyperparathyroidism of renal origin: Secondary | ICD-10-CM | POA: Diagnosis not present

## 2019-09-16 DIAGNOSIS — N186 End stage renal disease: Secondary | ICD-10-CM | POA: Diagnosis not present

## 2019-09-16 DIAGNOSIS — Z992 Dependence on renal dialysis: Secondary | ICD-10-CM | POA: Diagnosis not present

## 2019-09-17 DIAGNOSIS — Z992 Dependence on renal dialysis: Secondary | ICD-10-CM | POA: Diagnosis not present

## 2019-09-17 DIAGNOSIS — N2581 Secondary hyperparathyroidism of renal origin: Secondary | ICD-10-CM | POA: Diagnosis not present

## 2019-09-17 DIAGNOSIS — N186 End stage renal disease: Secondary | ICD-10-CM | POA: Diagnosis not present

## 2019-09-18 DIAGNOSIS — N186 End stage renal disease: Secondary | ICD-10-CM | POA: Diagnosis not present

## 2019-09-18 DIAGNOSIS — Z992 Dependence on renal dialysis: Secondary | ICD-10-CM | POA: Diagnosis not present

## 2019-09-18 DIAGNOSIS — N2581 Secondary hyperparathyroidism of renal origin: Secondary | ICD-10-CM | POA: Diagnosis not present

## 2019-09-19 DIAGNOSIS — N186 End stage renal disease: Secondary | ICD-10-CM | POA: Diagnosis not present

## 2019-09-19 DIAGNOSIS — Z992 Dependence on renal dialysis: Secondary | ICD-10-CM | POA: Diagnosis not present

## 2019-09-19 DIAGNOSIS — N2581 Secondary hyperparathyroidism of renal origin: Secondary | ICD-10-CM | POA: Diagnosis not present

## 2019-09-20 DIAGNOSIS — N2581 Secondary hyperparathyroidism of renal origin: Secondary | ICD-10-CM | POA: Diagnosis not present

## 2019-09-20 DIAGNOSIS — Z992 Dependence on renal dialysis: Secondary | ICD-10-CM | POA: Diagnosis not present

## 2019-09-20 DIAGNOSIS — N186 End stage renal disease: Secondary | ICD-10-CM | POA: Diagnosis not present

## 2019-09-21 DIAGNOSIS — N2581 Secondary hyperparathyroidism of renal origin: Secondary | ICD-10-CM | POA: Diagnosis not present

## 2019-09-21 DIAGNOSIS — N186 End stage renal disease: Secondary | ICD-10-CM | POA: Diagnosis not present

## 2019-09-21 DIAGNOSIS — Z992 Dependence on renal dialysis: Secondary | ICD-10-CM | POA: Diagnosis not present

## 2019-09-22 DIAGNOSIS — Z992 Dependence on renal dialysis: Secondary | ICD-10-CM | POA: Diagnosis not present

## 2019-09-22 DIAGNOSIS — N186 End stage renal disease: Secondary | ICD-10-CM | POA: Diagnosis not present

## 2019-09-22 DIAGNOSIS — N2581 Secondary hyperparathyroidism of renal origin: Secondary | ICD-10-CM | POA: Diagnosis not present

## 2019-09-23 DIAGNOSIS — N186 End stage renal disease: Secondary | ICD-10-CM | POA: Diagnosis not present

## 2019-09-23 DIAGNOSIS — Z992 Dependence on renal dialysis: Secondary | ICD-10-CM | POA: Diagnosis not present

## 2019-09-23 DIAGNOSIS — N2581 Secondary hyperparathyroidism of renal origin: Secondary | ICD-10-CM | POA: Diagnosis not present

## 2019-09-24 DIAGNOSIS — N186 End stage renal disease: Secondary | ICD-10-CM | POA: Diagnosis not present

## 2019-09-24 DIAGNOSIS — N2581 Secondary hyperparathyroidism of renal origin: Secondary | ICD-10-CM | POA: Diagnosis not present

## 2019-09-24 DIAGNOSIS — Z992 Dependence on renal dialysis: Secondary | ICD-10-CM | POA: Diagnosis not present

## 2019-09-25 DIAGNOSIS — Z992 Dependence on renal dialysis: Secondary | ICD-10-CM | POA: Diagnosis not present

## 2019-09-25 DIAGNOSIS — N2581 Secondary hyperparathyroidism of renal origin: Secondary | ICD-10-CM | POA: Diagnosis not present

## 2019-09-25 DIAGNOSIS — N186 End stage renal disease: Secondary | ICD-10-CM | POA: Diagnosis not present

## 2019-09-26 DIAGNOSIS — N186 End stage renal disease: Secondary | ICD-10-CM | POA: Diagnosis not present

## 2019-09-26 DIAGNOSIS — Z992 Dependence on renal dialysis: Secondary | ICD-10-CM | POA: Diagnosis not present

## 2019-09-26 DIAGNOSIS — N2581 Secondary hyperparathyroidism of renal origin: Secondary | ICD-10-CM | POA: Diagnosis not present

## 2019-09-27 DIAGNOSIS — N2581 Secondary hyperparathyroidism of renal origin: Secondary | ICD-10-CM | POA: Diagnosis not present

## 2019-09-27 DIAGNOSIS — Z992 Dependence on renal dialysis: Secondary | ICD-10-CM | POA: Diagnosis not present

## 2019-09-27 DIAGNOSIS — N186 End stage renal disease: Secondary | ICD-10-CM | POA: Diagnosis not present

## 2019-09-28 DIAGNOSIS — N2581 Secondary hyperparathyroidism of renal origin: Secondary | ICD-10-CM | POA: Diagnosis not present

## 2019-09-28 DIAGNOSIS — Z992 Dependence on renal dialysis: Secondary | ICD-10-CM | POA: Diagnosis not present

## 2019-09-28 DIAGNOSIS — N186 End stage renal disease: Secondary | ICD-10-CM | POA: Diagnosis not present

## 2019-09-29 DIAGNOSIS — N2581 Secondary hyperparathyroidism of renal origin: Secondary | ICD-10-CM | POA: Diagnosis not present

## 2019-09-29 DIAGNOSIS — N186 End stage renal disease: Secondary | ICD-10-CM | POA: Diagnosis not present

## 2019-09-29 DIAGNOSIS — Z992 Dependence on renal dialysis: Secondary | ICD-10-CM | POA: Diagnosis not present

## 2019-09-30 DIAGNOSIS — N186 End stage renal disease: Secondary | ICD-10-CM | POA: Diagnosis not present

## 2019-09-30 DIAGNOSIS — Z992 Dependence on renal dialysis: Secondary | ICD-10-CM | POA: Diagnosis not present

## 2019-09-30 DIAGNOSIS — N2581 Secondary hyperparathyroidism of renal origin: Secondary | ICD-10-CM | POA: Diagnosis not present

## 2019-10-01 DIAGNOSIS — N186 End stage renal disease: Secondary | ICD-10-CM | POA: Diagnosis not present

## 2019-10-01 DIAGNOSIS — Z992 Dependence on renal dialysis: Secondary | ICD-10-CM | POA: Diagnosis not present

## 2019-10-01 DIAGNOSIS — N2581 Secondary hyperparathyroidism of renal origin: Secondary | ICD-10-CM | POA: Diagnosis not present

## 2019-10-02 DIAGNOSIS — Z992 Dependence on renal dialysis: Secondary | ICD-10-CM | POA: Diagnosis not present

## 2019-10-02 DIAGNOSIS — N2581 Secondary hyperparathyroidism of renal origin: Secondary | ICD-10-CM | POA: Diagnosis not present

## 2019-10-02 DIAGNOSIS — N186 End stage renal disease: Secondary | ICD-10-CM | POA: Diagnosis not present

## 2019-10-03 DIAGNOSIS — N2581 Secondary hyperparathyroidism of renal origin: Secondary | ICD-10-CM | POA: Diagnosis not present

## 2019-10-03 DIAGNOSIS — Z992 Dependence on renal dialysis: Secondary | ICD-10-CM | POA: Diagnosis not present

## 2019-10-03 DIAGNOSIS — N186 End stage renal disease: Secondary | ICD-10-CM | POA: Diagnosis not present

## 2019-10-04 DIAGNOSIS — N186 End stage renal disease: Secondary | ICD-10-CM | POA: Diagnosis not present

## 2019-10-04 DIAGNOSIS — Z992 Dependence on renal dialysis: Secondary | ICD-10-CM | POA: Diagnosis not present

## 2019-10-04 DIAGNOSIS — N2581 Secondary hyperparathyroidism of renal origin: Secondary | ICD-10-CM | POA: Diagnosis not present

## 2019-10-05 DIAGNOSIS — N186 End stage renal disease: Secondary | ICD-10-CM | POA: Diagnosis not present

## 2019-10-05 DIAGNOSIS — N2581 Secondary hyperparathyroidism of renal origin: Secondary | ICD-10-CM | POA: Diagnosis not present

## 2019-10-05 DIAGNOSIS — Z992 Dependence on renal dialysis: Secondary | ICD-10-CM | POA: Diagnosis not present

## 2019-10-06 DIAGNOSIS — N2581 Secondary hyperparathyroidism of renal origin: Secondary | ICD-10-CM | POA: Diagnosis not present

## 2019-10-06 DIAGNOSIS — N186 End stage renal disease: Secondary | ICD-10-CM | POA: Diagnosis not present

## 2019-10-06 DIAGNOSIS — Z992 Dependence on renal dialysis: Secondary | ICD-10-CM | POA: Diagnosis not present

## 2019-10-07 DIAGNOSIS — N2581 Secondary hyperparathyroidism of renal origin: Secondary | ICD-10-CM | POA: Diagnosis not present

## 2019-10-07 DIAGNOSIS — N186 End stage renal disease: Secondary | ICD-10-CM | POA: Diagnosis not present

## 2019-10-07 DIAGNOSIS — Z992 Dependence on renal dialysis: Secondary | ICD-10-CM | POA: Diagnosis not present

## 2019-10-08 DIAGNOSIS — N2581 Secondary hyperparathyroidism of renal origin: Secondary | ICD-10-CM | POA: Diagnosis not present

## 2019-10-08 DIAGNOSIS — N186 End stage renal disease: Secondary | ICD-10-CM | POA: Diagnosis not present

## 2019-10-08 DIAGNOSIS — Z992 Dependence on renal dialysis: Secondary | ICD-10-CM | POA: Diagnosis not present

## 2019-10-09 DIAGNOSIS — N186 End stage renal disease: Secondary | ICD-10-CM | POA: Diagnosis not present

## 2019-10-09 DIAGNOSIS — H9041 Sensorineural hearing loss, unilateral, right ear, with unrestricted hearing on the contralateral side: Secondary | ICD-10-CM | POA: Diagnosis not present

## 2019-10-09 DIAGNOSIS — N2581 Secondary hyperparathyroidism of renal origin: Secondary | ICD-10-CM | POA: Diagnosis not present

## 2019-10-09 DIAGNOSIS — Z992 Dependence on renal dialysis: Secondary | ICD-10-CM | POA: Diagnosis not present

## 2019-10-09 DIAGNOSIS — H9311 Tinnitus, right ear: Secondary | ICD-10-CM | POA: Diagnosis not present

## 2019-10-10 DIAGNOSIS — N2581 Secondary hyperparathyroidism of renal origin: Secondary | ICD-10-CM | POA: Diagnosis not present

## 2019-10-10 DIAGNOSIS — L298 Other pruritus: Secondary | ICD-10-CM | POA: Diagnosis not present

## 2019-10-10 DIAGNOSIS — N186 End stage renal disease: Secondary | ICD-10-CM | POA: Diagnosis not present

## 2019-10-10 DIAGNOSIS — L280 Lichen simplex chronicus: Secondary | ICD-10-CM | POA: Diagnosis not present

## 2019-10-10 DIAGNOSIS — Z992 Dependence on renal dialysis: Secondary | ICD-10-CM | POA: Diagnosis not present

## 2019-10-10 DIAGNOSIS — L84 Corns and callosities: Secondary | ICD-10-CM | POA: Diagnosis not present

## 2019-10-10 DIAGNOSIS — L853 Xerosis cutis: Secondary | ICD-10-CM | POA: Diagnosis not present

## 2019-10-11 DIAGNOSIS — N2581 Secondary hyperparathyroidism of renal origin: Secondary | ICD-10-CM | POA: Diagnosis not present

## 2019-10-11 DIAGNOSIS — Z992 Dependence on renal dialysis: Secondary | ICD-10-CM | POA: Diagnosis not present

## 2019-10-11 DIAGNOSIS — N186 End stage renal disease: Secondary | ICD-10-CM | POA: Diagnosis not present

## 2019-10-12 DIAGNOSIS — N2581 Secondary hyperparathyroidism of renal origin: Secondary | ICD-10-CM | POA: Diagnosis not present

## 2019-10-12 DIAGNOSIS — N186 End stage renal disease: Secondary | ICD-10-CM | POA: Diagnosis not present

## 2019-10-12 DIAGNOSIS — Z992 Dependence on renal dialysis: Secondary | ICD-10-CM | POA: Diagnosis not present

## 2019-10-13 DIAGNOSIS — N186 End stage renal disease: Secondary | ICD-10-CM | POA: Diagnosis not present

## 2019-10-13 DIAGNOSIS — N2581 Secondary hyperparathyroidism of renal origin: Secondary | ICD-10-CM | POA: Diagnosis not present

## 2019-10-13 DIAGNOSIS — Z992 Dependence on renal dialysis: Secondary | ICD-10-CM | POA: Diagnosis not present

## 2019-10-14 DIAGNOSIS — Z992 Dependence on renal dialysis: Secondary | ICD-10-CM | POA: Diagnosis not present

## 2019-10-14 DIAGNOSIS — N186 End stage renal disease: Secondary | ICD-10-CM | POA: Diagnosis not present

## 2019-10-14 DIAGNOSIS — E1122 Type 2 diabetes mellitus with diabetic chronic kidney disease: Secondary | ICD-10-CM | POA: Diagnosis not present

## 2019-10-14 DIAGNOSIS — N2581 Secondary hyperparathyroidism of renal origin: Secondary | ICD-10-CM | POA: Diagnosis not present

## 2019-10-15 DIAGNOSIS — N186 End stage renal disease: Secondary | ICD-10-CM | POA: Diagnosis not present

## 2019-10-15 DIAGNOSIS — N2581 Secondary hyperparathyroidism of renal origin: Secondary | ICD-10-CM | POA: Diagnosis not present

## 2019-10-15 DIAGNOSIS — Z992 Dependence on renal dialysis: Secondary | ICD-10-CM | POA: Diagnosis not present

## 2019-10-16 DIAGNOSIS — N2581 Secondary hyperparathyroidism of renal origin: Secondary | ICD-10-CM | POA: Diagnosis not present

## 2019-10-16 DIAGNOSIS — N186 End stage renal disease: Secondary | ICD-10-CM | POA: Diagnosis not present

## 2019-10-16 DIAGNOSIS — Z992 Dependence on renal dialysis: Secondary | ICD-10-CM | POA: Diagnosis not present

## 2019-10-17 DIAGNOSIS — Z992 Dependence on renal dialysis: Secondary | ICD-10-CM | POA: Diagnosis not present

## 2019-10-17 DIAGNOSIS — N186 End stage renal disease: Secondary | ICD-10-CM | POA: Diagnosis not present

## 2019-10-17 DIAGNOSIS — N2581 Secondary hyperparathyroidism of renal origin: Secondary | ICD-10-CM | POA: Diagnosis not present

## 2019-10-18 ENCOUNTER — Encounter: Payer: Self-pay | Admitting: General Practice

## 2019-10-18 DIAGNOSIS — N186 End stage renal disease: Secondary | ICD-10-CM | POA: Diagnosis not present

## 2019-10-18 DIAGNOSIS — N2581 Secondary hyperparathyroidism of renal origin: Secondary | ICD-10-CM | POA: Diagnosis not present

## 2019-10-18 DIAGNOSIS — Z992 Dependence on renal dialysis: Secondary | ICD-10-CM | POA: Diagnosis not present

## 2019-10-19 DIAGNOSIS — N2581 Secondary hyperparathyroidism of renal origin: Secondary | ICD-10-CM | POA: Diagnosis not present

## 2019-10-19 DIAGNOSIS — Z992 Dependence on renal dialysis: Secondary | ICD-10-CM | POA: Diagnosis not present

## 2019-10-19 DIAGNOSIS — N186 End stage renal disease: Secondary | ICD-10-CM | POA: Diagnosis not present

## 2019-10-20 DIAGNOSIS — Z992 Dependence on renal dialysis: Secondary | ICD-10-CM | POA: Diagnosis not present

## 2019-10-20 DIAGNOSIS — N2581 Secondary hyperparathyroidism of renal origin: Secondary | ICD-10-CM | POA: Diagnosis not present

## 2019-10-20 DIAGNOSIS — N186 End stage renal disease: Secondary | ICD-10-CM | POA: Diagnosis not present

## 2019-10-21 ENCOUNTER — Ambulatory Visit (INDEPENDENT_AMBULATORY_CARE_PROVIDER_SITE_OTHER): Payer: Federal, State, Local not specified - PPO | Admitting: Family Medicine

## 2019-10-21 ENCOUNTER — Other Ambulatory Visit: Payer: Self-pay

## 2019-10-21 ENCOUNTER — Encounter: Payer: Self-pay | Admitting: Family Medicine

## 2019-10-21 VITALS — BP 144/90 | HR 70 | Ht 66.5 in | Wt 310.0 lb

## 2019-10-21 DIAGNOSIS — N186 End stage renal disease: Secondary | ICD-10-CM

## 2019-10-21 DIAGNOSIS — N2581 Secondary hyperparathyroidism of renal origin: Secondary | ICD-10-CM | POA: Diagnosis not present

## 2019-10-21 DIAGNOSIS — I5032 Chronic diastolic (congestive) heart failure: Secondary | ICD-10-CM

## 2019-10-21 DIAGNOSIS — Z992 Dependence on renal dialysis: Secondary | ICD-10-CM

## 2019-10-21 DIAGNOSIS — Z0000 Encounter for general adult medical examination without abnormal findings: Secondary | ICD-10-CM

## 2019-10-21 DIAGNOSIS — I10 Essential (primary) hypertension: Secondary | ICD-10-CM

## 2019-10-21 NOTE — Patient Instructions (Addendum)
It was a pleasure meeting you today. Thank you for trusting [place] with your health.   I will get the records from your nephrologist and see which labs we need to check.   Keep an eye on your blood pressure, it is elevated today.     Preventive Care 10-49 Years Old, Male Preventive care refers to lifestyle choices and visits with your health care provider that can promote health and wellness. This includes:  A yearly physical exam. This is also called an annual well check.  Regular dental and eye exams.  Immunizations.  Screening for certain conditions.  Healthy lifestyle choices, such as eating a healthy diet, getting regular exercise, not using drugs or products that contain nicotine and tobacco, and limiting alcohol use. What can I expect for my preventive care visit? Physical exam Your health care provider will check:  Height and weight. These may be used to calculate body mass index (BMI), which is a measurement that tells if you are at a healthy weight.  Heart rate and blood pressure.  Your skin for abnormal spots. Counseling Your health care provider may ask you questions about:  Alcohol, tobacco, and drug use.  Emotional well-being.  Home and relationship well-being.  Sexual activity.  Eating habits.  Work and work Statistician. What immunizations do I need?  Influenza (flu) vaccine  This is recommended every year. Tetanus, diphtheria, and pertussis (Tdap) vaccine  You may need a Td booster every 10 years. Varicella (chickenpox) vaccine  You may need this vaccine if you have not already been vaccinated. Zoster (shingles) vaccine  You may need this after age 33. Measles, mumps, and rubella (MMR) vaccine  You may need at least one dose of MMR if you were born in 1957 or later. You may also need a second dose. Pneumococcal conjugate (PCV13) vaccine  You may need this if you have certain conditions and were not previously vaccinated. Pneumococcal  polysaccharide (PPSV23) vaccine  You may need one or two doses if you smoke cigarettes or if you have certain conditions. Meningococcal conjugate (MenACWY) vaccine  You may need this if you have certain conditions. Hepatitis A vaccine  You may need this if you have certain conditions or if you travel or work in places where you may be exposed to hepatitis A. Hepatitis B vaccine  You may need this if you have certain conditions or if you travel or work in places where you may be exposed to hepatitis B. Haemophilus influenzae type b (Hib) vaccine  You may need this if you have certain risk factors. Human papillomavirus (HPV) vaccine  If recommended by your health care provider, you may need three doses over 6 months. You may receive vaccines as individual doses or as more than one vaccine together in one shot (combination vaccines). Talk with your health care provider about the risks and benefits of combination vaccines. What tests do I need? Blood tests  Lipid and cholesterol levels. These may be checked every 5 years, or more frequently if you are over 62 years old.  Hepatitis C test.  Hepatitis B test. Screening  Lung cancer screening. You may have this screening every year starting at age 23 if you have a 30-pack-year history of smoking and currently smoke or have quit within the past 15 years.  Prostate cancer screening. Recommendations will vary depending on your family history and other risks.  Colorectal cancer screening. All adults should have this screening starting at age 33 and continuing until age 68.  Your health care provider may recommend screening at age 68 if you are at increased risk. You will have tests every 1-10 years, depending on your results and the type of screening test.  Diabetes screening. This is done by checking your blood sugar (glucose) after you have not eaten for a while (fasting). You may have this done every 1-3 years.  Sexually transmitted  disease (STD) testing. Follow these instructions at home: Eating and drinking  Eat a diet that includes fresh fruits and vegetables, whole grains, lean protein, and low-fat dairy products.  Take vitamin and mineral supplements as recommended by your health care provider.  Do not drink alcohol if your health care provider tells you not to drink.  If you drink alcohol: ? Limit how much you have to 0-2 drinks a day. ? Be aware of how much alcohol is in your drink. In the [place] one drink equals one 12 oz bottle of beer (355 mL), one 5 oz glass of wine (148 mL), or one 1 oz glass of hard liquor (44 mL). Lifestyle  Take daily care of your teeth and gums.  Stay active. Exercise for at least 30 minutes on 5 or more days each week.  Do not use any products that contain nicotine or tobacco, such as cigarettes, e-cigarettes, and chewing tobacco. If you need help quitting, ask your health care provider.  If you are sexually active, practice safe sex. Use a condom or other form of protection to prevent STIs (sexually transmitted infections).  Talk with your health care provider about taking a low-dose aspirin every day starting at age 64. What's next?  Go to your health care provider once a year for a well check visit.  Ask your health care provider how often you should have your eyes and teeth checked.  Stay up to date on all vaccines. This information is not intended to replace advice given to you by your health care provider. Make sure you discuss any questions you have with your health care provider. Document Revised: 04/26/2018 Document Reviewed: 04/26/2018 Elsevier Patient Education  2020 [employer].

## 2019-10-21 NOTE — Progress Notes (Signed)
Subjective:    Patient ID: Adrian Frye, male    DOB: March 11, 1971, 49 y.o.   MRN: [id number]  HPI Chief Complaint  Patient presents with  . new pt    new pt cpe    He is new to the practice and here for a complete physical exam. Previous medical care: no PCP in years   Last CPE: years ago   Other providers: Dr. Joelyn Oms - nephrologist  Dr. Radford Pax - cardiologist   ESRD- States he is doing peritoneal dialysis now but still has his hemodialysis catheter in place.  Suspected this is due to underlying HTN. States he is not sure which came first, the HTN or kidney disease.  He sees Dr. Joelyn Oms monthly.  Reports having labs done at nephrology office last week and I will get these records. Reports having cholesterol checked also.   HTN- BP at home is reportedly in goal range typically. He records his BP readings for nephrology.  He is only on a low dose of amlodipine. Reports hx of angioedema with lisinopril.   Eats a diet low in sodium when not eating out.  Has been walking for exercise but he used to do more when he was going to the [facility].   CHF history- denies chest pain, palpitations, DOE, orthopnea, edema.   History of diabetes with a Hgb A1c 7.1% 10 years ago. His most recent A1c was normal. He denies ever being treated for diabetes.   States he has taken a statin in the past but has been a couple of years. No side effects and cannot recall why he stopped. Lipids checked at nephrologist last week. Request records.   States he is establishing care with a bariatric clinic. Plans to lose weight so that he can have a kidney transplant. States he has lost 118 lbs.    Social history: Lives with his wife, has 2 sons, works as a Clinical cytogeneticist  Denies smoking but he has a hx of light cigarette smoking and stopped in 2011. Rarely drinks alcohol, no drug use  Immunizations: Tdap last year per patient.    Health maintenance:  Colonoscopy: 02/2019 and due for recall in  2023 Last PSA: never that he is aware  Last Dental Exam: 3 years ago, dentist passed away and Covid delayed  Last Eye Exam: 1 -1 1/2 years ago   Wears seatbelt always, smoke detectors in home and functioning, does not text while driving, feels safe in home environment.  Reviewed allergies, medications, past medical, surgical, family, and social history.   Review of Systems Review of Systems Constitutional: -fever, -chills, -sweats, -unexpected weight change,-fatigue ENT: -runny nose, -ear pain, -sore throat Cardiology:  -chest pain, -palpitations, -edema Respiratory: -cough, -shortness of breath, -wheezing Gastroenterology: -abdominal pain, -nausea, -vomiting, -diarrhea, -constipation  Hematology: -bleeding or bruising problems Musculoskeletal: -arthralgias, -myalgias, -joint swelling, -back pain Ophthalmology: -vision changes Urology: -dysuria, -difficulty urinating, -hematuria, -urinary frequency, -urgency Neurology: -headache, -weakness, -tingling, -numbness       Objective:   Physical Exam BP (!) 144/90   Pulse 70   Ht 5' 6.5" (1.689 m)   Wt (!) 310 lb (140.6 kg)   BMI 49.29 kg/m   General Appearance:    Alert, cooperative, no distress, appears stated age  Head:    Normocephalic, without obvious abnormality, atraumatic  Eyes:    PERRL, conjunctiva/corneas clear, EOM's intact  Ears:    Normal TM's and external ear canals  Nose:   Mask in place  Throat:   Mask in place   Neck:   Supple, no lymphadenopathy;  thyroid:  no   enlargement/tenderness/nodules; no JVD  Back:    Spine nontender, no curvature, ROM normal, no CVA     tenderness  Lungs:     Clear to auscultation bilaterally without wheezes, rales or     ronchi; respirations unlabored  Chest Wall:    No tenderness or deformity. Dialysis catheter in place with a clean and dry dressing on left upper chest wall.    Heart:    Regular rate and rhythm, S1 and S2 normal, no murmur, rub   or gallop  Breast Exam:    No  chest wall tenderness  Abdomen:     Soft, non-tender, distended, dialysis catheter in place, normoactive bowel sounds,    no palpable masses  Genitalia:    Not done      Extremities:   No clubbing, cyanosis or edema  Pulses:   2+ and symmetric   Skin:   Skin color, texture, turgor normal, no rashes or lesions  Lymph nodes:   Cervical, supraclavicular, and axillary nodes normal  Neurologic:   CNII-XII intact, normal strength, sensation and gait          Psych:   Normal mood, affect, hygiene and grooming.        Assessment & Plan:  Routine general medical examination at a health care facility -Is a pleasant 49 year old male who is new to the practice and here for a complete physical exam. Reports being in his usual state of health. Preventive health care reviewed and he is up-to-date on colonoscopy.  Counseling done on PSA blood test.  We will hold off on blood work today since he had a complete set of labs done at his nephrologist last week.  He is followed closely by Dr. Joelyn Oms for ESRD.  I will request lab results and office notes. Counseling on healthy lifestyle including diet and exercise. Immunizations reviewed.  Tdap reportedly up-to-date. Discussed safety I asked him to follow-up in 4 weeks so that I would have his medical records and lab results from [facility] and Dr. Joelyn Oms to review.  Consider PSA at that time.  Morbid obesity ([place]) -He has already lost a significant amount of weight and his plan is to consult with bariatric specialist so that he may lose more weight and to be eligible for a kidney transplant.  Hypertension, unspecified type -Discussed that his blood pressure is elevated today and to continue keep an eye on this at home.  He does report fairly normal readings at home.  Recommend low-sodium diet.  Chronic diastolic heart failure (HCC) -Asymptomatic.  Denies having an issue with this for quite some time.  He does see Dr. Radford Pax and will follow up as  needed.  ESRD on peritoneal dialysis ([place]) -Closely followed by Dr. Joelyn Oms, nephrologist.

## 2019-10-22 DIAGNOSIS — N186 End stage renal disease: Secondary | ICD-10-CM | POA: Diagnosis not present

## 2019-10-22 DIAGNOSIS — Z992 Dependence on renal dialysis: Secondary | ICD-10-CM | POA: Diagnosis not present

## 2019-10-22 DIAGNOSIS — N2581 Secondary hyperparathyroidism of renal origin: Secondary | ICD-10-CM | POA: Diagnosis not present

## 2019-10-23 DIAGNOSIS — N186 End stage renal disease: Secondary | ICD-10-CM | POA: Diagnosis not present

## 2019-10-23 DIAGNOSIS — N2581 Secondary hyperparathyroidism of renal origin: Secondary | ICD-10-CM | POA: Diagnosis not present

## 2019-10-23 DIAGNOSIS — Z992 Dependence on renal dialysis: Secondary | ICD-10-CM | POA: Diagnosis not present

## 2019-10-24 DIAGNOSIS — N186 End stage renal disease: Secondary | ICD-10-CM | POA: Diagnosis not present

## 2019-10-24 DIAGNOSIS — N2581 Secondary hyperparathyroidism of renal origin: Secondary | ICD-10-CM | POA: Diagnosis not present

## 2019-10-24 DIAGNOSIS — Z992 Dependence on renal dialysis: Secondary | ICD-10-CM | POA: Diagnosis not present

## 2019-10-25 DIAGNOSIS — Z992 Dependence on renal dialysis: Secondary | ICD-10-CM | POA: Diagnosis not present

## 2019-10-25 DIAGNOSIS — N2581 Secondary hyperparathyroidism of renal origin: Secondary | ICD-10-CM | POA: Diagnosis not present

## 2019-10-25 DIAGNOSIS — N186 End stage renal disease: Secondary | ICD-10-CM | POA: Diagnosis not present

## 2019-10-26 DIAGNOSIS — Z992 Dependence on renal dialysis: Secondary | ICD-10-CM | POA: Diagnosis not present

## 2019-10-26 DIAGNOSIS — N2581 Secondary hyperparathyroidism of renal origin: Secondary | ICD-10-CM | POA: Diagnosis not present

## 2019-10-26 DIAGNOSIS — N186 End stage renal disease: Secondary | ICD-10-CM | POA: Diagnosis not present

## 2019-10-27 DIAGNOSIS — N2581 Secondary hyperparathyroidism of renal origin: Secondary | ICD-10-CM | POA: Diagnosis not present

## 2019-10-27 DIAGNOSIS — N186 End stage renal disease: Secondary | ICD-10-CM | POA: Diagnosis not present

## 2019-10-27 DIAGNOSIS — Z992 Dependence on renal dialysis: Secondary | ICD-10-CM | POA: Diagnosis not present

## 2019-10-28 ENCOUNTER — Telehealth: Payer: Self-pay | Admitting: Family Medicine

## 2019-10-28 DIAGNOSIS — N186 End stage renal disease: Secondary | ICD-10-CM | POA: Diagnosis not present

## 2019-10-28 DIAGNOSIS — N2581 Secondary hyperparathyroidism of renal origin: Secondary | ICD-10-CM | POA: Diagnosis not present

## 2019-10-28 DIAGNOSIS — Z992 Dependence on renal dialysis: Secondary | ICD-10-CM | POA: Diagnosis not present

## 2019-10-28 NOTE — Telephone Encounter (Signed)
Requested records received from [facility]

## 2019-10-29 DIAGNOSIS — N2581 Secondary hyperparathyroidism of renal origin: Secondary | ICD-10-CM | POA: Diagnosis not present

## 2019-10-29 DIAGNOSIS — N186 End stage renal disease: Secondary | ICD-10-CM | POA: Diagnosis not present

## 2019-10-29 DIAGNOSIS — Z992 Dependence on renal dialysis: Secondary | ICD-10-CM | POA: Diagnosis not present

## 2019-10-30 DIAGNOSIS — N186 End stage renal disease: Secondary | ICD-10-CM | POA: Diagnosis not present

## 2019-10-30 DIAGNOSIS — N2581 Secondary hyperparathyroidism of renal origin: Secondary | ICD-10-CM | POA: Diagnosis not present

## 2019-10-30 DIAGNOSIS — Z992 Dependence on renal dialysis: Secondary | ICD-10-CM | POA: Diagnosis not present

## 2019-10-31 DIAGNOSIS — Z992 Dependence on renal dialysis: Secondary | ICD-10-CM | POA: Diagnosis not present

## 2019-10-31 DIAGNOSIS — N186 End stage renal disease: Secondary | ICD-10-CM | POA: Diagnosis not present

## 2019-10-31 DIAGNOSIS — N2581 Secondary hyperparathyroidism of renal origin: Secondary | ICD-10-CM | POA: Diagnosis not present

## 2019-11-01 DIAGNOSIS — N2581 Secondary hyperparathyroidism of renal origin: Secondary | ICD-10-CM | POA: Diagnosis not present

## 2019-11-01 DIAGNOSIS — Z992 Dependence on renal dialysis: Secondary | ICD-10-CM | POA: Diagnosis not present

## 2019-11-01 DIAGNOSIS — N186 End stage renal disease: Secondary | ICD-10-CM | POA: Diagnosis not present

## 2019-11-02 DIAGNOSIS — Z992 Dependence on renal dialysis: Secondary | ICD-10-CM | POA: Diagnosis not present

## 2019-11-02 DIAGNOSIS — N186 End stage renal disease: Secondary | ICD-10-CM | POA: Diagnosis not present

## 2019-11-02 DIAGNOSIS — N2581 Secondary hyperparathyroidism of renal origin: Secondary | ICD-10-CM | POA: Diagnosis not present

## 2019-11-03 DIAGNOSIS — N186 End stage renal disease: Secondary | ICD-10-CM | POA: Diagnosis not present

## 2019-11-03 DIAGNOSIS — N2581 Secondary hyperparathyroidism of renal origin: Secondary | ICD-10-CM | POA: Diagnosis not present

## 2019-11-03 DIAGNOSIS — Z992 Dependence on renal dialysis: Secondary | ICD-10-CM | POA: Diagnosis not present

## 2019-11-04 DIAGNOSIS — N186 End stage renal disease: Secondary | ICD-10-CM | POA: Diagnosis not present

## 2019-11-04 DIAGNOSIS — Z992 Dependence on renal dialysis: Secondary | ICD-10-CM | POA: Diagnosis not present

## 2019-11-04 DIAGNOSIS — N2581 Secondary hyperparathyroidism of renal origin: Secondary | ICD-10-CM | POA: Diagnosis not present

## 2019-11-05 DIAGNOSIS — Z992 Dependence on renal dialysis: Secondary | ICD-10-CM | POA: Diagnosis not present

## 2019-11-05 DIAGNOSIS — N186 End stage renal disease: Secondary | ICD-10-CM | POA: Diagnosis not present

## 2019-11-05 DIAGNOSIS — N2581 Secondary hyperparathyroidism of renal origin: Secondary | ICD-10-CM | POA: Diagnosis not present

## 2019-11-06 DIAGNOSIS — N2581 Secondary hyperparathyroidism of renal origin: Secondary | ICD-10-CM | POA: Diagnosis not present

## 2019-11-06 DIAGNOSIS — N186 End stage renal disease: Secondary | ICD-10-CM | POA: Diagnosis not present

## 2019-11-06 DIAGNOSIS — Z992 Dependence on renal dialysis: Secondary | ICD-10-CM | POA: Diagnosis not present

## 2019-11-07 DIAGNOSIS — Z992 Dependence on renal dialysis: Secondary | ICD-10-CM | POA: Diagnosis not present

## 2019-11-07 DIAGNOSIS — N2581 Secondary hyperparathyroidism of renal origin: Secondary | ICD-10-CM | POA: Diagnosis not present

## 2019-11-07 DIAGNOSIS — N186 End stage renal disease: Secondary | ICD-10-CM | POA: Diagnosis not present

## 2019-11-08 DIAGNOSIS — N2581 Secondary hyperparathyroidism of renal origin: Secondary | ICD-10-CM | POA: Diagnosis not present

## 2019-11-08 DIAGNOSIS — N186 End stage renal disease: Secondary | ICD-10-CM | POA: Diagnosis not present

## 2019-11-08 DIAGNOSIS — Z992 Dependence on renal dialysis: Secondary | ICD-10-CM | POA: Diagnosis not present

## 2019-11-09 DIAGNOSIS — N2581 Secondary hyperparathyroidism of renal origin: Secondary | ICD-10-CM | POA: Diagnosis not present

## 2019-11-09 DIAGNOSIS — Z992 Dependence on renal dialysis: Secondary | ICD-10-CM | POA: Diagnosis not present

## 2019-11-09 DIAGNOSIS — N186 End stage renal disease: Secondary | ICD-10-CM | POA: Diagnosis not present

## 2019-11-10 DIAGNOSIS — N2581 Secondary hyperparathyroidism of renal origin: Secondary | ICD-10-CM | POA: Diagnosis not present

## 2019-11-10 DIAGNOSIS — N186 End stage renal disease: Secondary | ICD-10-CM | POA: Diagnosis not present

## 2019-11-10 DIAGNOSIS — Z992 Dependence on renal dialysis: Secondary | ICD-10-CM | POA: Diagnosis not present

## 2019-11-11 DIAGNOSIS — Z992 Dependence on renal dialysis: Secondary | ICD-10-CM | POA: Diagnosis not present

## 2019-11-11 DIAGNOSIS — N2581 Secondary hyperparathyroidism of renal origin: Secondary | ICD-10-CM | POA: Diagnosis not present

## 2019-11-11 DIAGNOSIS — N186 End stage renal disease: Secondary | ICD-10-CM | POA: Diagnosis not present

## 2019-11-12 DIAGNOSIS — Z992 Dependence on renal dialysis: Secondary | ICD-10-CM | POA: Diagnosis not present

## 2019-11-12 DIAGNOSIS — N2581 Secondary hyperparathyroidism of renal origin: Secondary | ICD-10-CM | POA: Diagnosis not present

## 2019-11-12 DIAGNOSIS — N186 End stage renal disease: Secondary | ICD-10-CM | POA: Diagnosis not present

## 2019-11-13 DIAGNOSIS — E1122 Type 2 diabetes mellitus with diabetic chronic kidney disease: Secondary | ICD-10-CM | POA: Diagnosis not present

## 2019-11-13 DIAGNOSIS — N2581 Secondary hyperparathyroidism of renal origin: Secondary | ICD-10-CM | POA: Diagnosis not present

## 2019-11-13 DIAGNOSIS — N186 End stage renal disease: Secondary | ICD-10-CM | POA: Diagnosis not present

## 2019-11-13 DIAGNOSIS — Z992 Dependence on renal dialysis: Secondary | ICD-10-CM | POA: Diagnosis not present

## 2019-11-14 DIAGNOSIS — N186 End stage renal disease: Secondary | ICD-10-CM | POA: Diagnosis not present

## 2019-11-14 DIAGNOSIS — Z992 Dependence on renal dialysis: Secondary | ICD-10-CM | POA: Diagnosis not present

## 2019-11-14 DIAGNOSIS — N2581 Secondary hyperparathyroidism of renal origin: Secondary | ICD-10-CM | POA: Diagnosis not present

## 2019-11-15 DIAGNOSIS — Z992 Dependence on renal dialysis: Secondary | ICD-10-CM | POA: Diagnosis not present

## 2019-11-15 DIAGNOSIS — N186 End stage renal disease: Secondary | ICD-10-CM | POA: Diagnosis not present

## 2019-11-15 DIAGNOSIS — N2581 Secondary hyperparathyroidism of renal origin: Secondary | ICD-10-CM | POA: Diagnosis not present

## 2019-11-16 DIAGNOSIS — N186 End stage renal disease: Secondary | ICD-10-CM | POA: Diagnosis not present

## 2019-11-16 DIAGNOSIS — N2581 Secondary hyperparathyroidism of renal origin: Secondary | ICD-10-CM | POA: Diagnosis not present

## 2019-11-16 DIAGNOSIS — Z992 Dependence on renal dialysis: Secondary | ICD-10-CM | POA: Diagnosis not present

## 2019-11-17 DIAGNOSIS — N2581 Secondary hyperparathyroidism of renal origin: Secondary | ICD-10-CM | POA: Diagnosis not present

## 2019-11-17 DIAGNOSIS — Z992 Dependence on renal dialysis: Secondary | ICD-10-CM | POA: Diagnosis not present

## 2019-11-17 DIAGNOSIS — N186 End stage renal disease: Secondary | ICD-10-CM | POA: Diagnosis not present

## 2019-11-18 DIAGNOSIS — N186 End stage renal disease: Secondary | ICD-10-CM | POA: Diagnosis not present

## 2019-11-18 DIAGNOSIS — Z992 Dependence on renal dialysis: Secondary | ICD-10-CM | POA: Diagnosis not present

## 2019-11-18 DIAGNOSIS — N2581 Secondary hyperparathyroidism of renal origin: Secondary | ICD-10-CM | POA: Diagnosis not present

## 2019-11-19 ENCOUNTER — Other Ambulatory Visit: Payer: Self-pay

## 2019-11-19 ENCOUNTER — Ambulatory Visit (INDEPENDENT_AMBULATORY_CARE_PROVIDER_SITE_OTHER): Payer: Federal, State, Local not specified - PPO | Admitting: Family Medicine

## 2019-11-19 ENCOUNTER — Encounter: Payer: Self-pay | Admitting: Family Medicine

## 2019-11-19 VITALS — BP 130/90 | HR 90 | Wt 303.6 lb

## 2019-11-19 DIAGNOSIS — N186 End stage renal disease: Secondary | ICD-10-CM

## 2019-11-19 DIAGNOSIS — I509 Heart failure, unspecified: Secondary | ICD-10-CM

## 2019-11-19 DIAGNOSIS — I420 Dilated cardiomyopathy: Secondary | ICD-10-CM

## 2019-11-19 DIAGNOSIS — I10 Essential (primary) hypertension: Secondary | ICD-10-CM | POA: Diagnosis not present

## 2019-11-19 DIAGNOSIS — Z992 Dependence on renal dialysis: Secondary | ICD-10-CM

## 2019-11-19 DIAGNOSIS — N2581 Secondary hyperparathyroidism of renal origin: Secondary | ICD-10-CM | POA: Diagnosis not present

## 2019-11-19 DIAGNOSIS — G4733 Obstructive sleep apnea (adult) (pediatric): Secondary | ICD-10-CM | POA: Diagnosis not present

## 2019-11-19 DIAGNOSIS — Z8709 Personal history of other diseases of the respiratory system: Secondary | ICD-10-CM

## 2019-11-19 NOTE — Progress Notes (Signed)
   Subjective:    Patient ID: Adrian Frye, male    DOB: 09/26/1970, 49 y.o.   MRN: [id number]  HPI Chief Complaint  Patient presents with  . follow-up    follow-up   He is here for a follow up. He was a new patient on 10/21/2019. We did not check labs since he is closely followed by his nephrologist and has labs regularly.  He has a port in but is currently doing peritoneal dialysis.  States he has an appointment for labs at [facility] in 2 days.   States he does not plan to follow up with his cardiologist at [facility]. He is interested in finding a new practice.  Hx of CHF and cardiomyopathy.  States he watches his fluid intake closely.   Hypertension-reports his blood pressures at home are always better than it in the doctor's office.  Denies issues with asthma for the past 20+ years.   OSA- uses CPAP nightly. Feels that he is benefiting from this. His cardiologist has checked this in the past.  Takes Sleepinol OTC nightly.   States he had an EGD and colonoscopy due to anemia.   He is going to talk to Dr. Raul Del regarding bariatric surgery.   No new concerns today.   Denies fever, chills, dizziness, chest pain, palpitations, shortness of breath, abdominal pain, N/V/D, urinary symptoms, LE edema.   Reviewed allergies, medications, past medical, surgical, family, and social history.    Review of Systems Pertinent positives and negatives in the history of present illness.     Objective:   Physical Exam BP 130/90   Pulse 90   Wt (!) 303 lb 9.6 oz (137.7 kg)   BMI 48.27 kg/m   Alert and oriented and in no acute distress. Not otherwise examined.       Assessment & Plan:  Hypertension, unspecified type - Plan: Ambulatory referral to Cardiology -Blood pressure slightly elevated.  He reports normal blood pressure readings at home.  States his blood pressure is always elevated at the doctor's office.  No issues with his medications.  Obstructive sleep  apnea -Reports using his CPAP nightly.  I recommend he continue using this.  ESRD on peritoneal dialysis [facility]) -He is followed closely by Dr. Joelyn Oms at [facility]  Congestive heart failure, unspecified HF chronicity, unspecified heart failure type ([place]) - Plan: Ambulatory referral to Cardiology -Appears euvolemic.  Asymptomatic.  Referral to [facility] cardiovascular  Dilated idiopathic cardiomyopathy ([place]) - Plan: Ambulatory referral to Cardiology  History of asthma -This has reportedly not been bothersome for 20+ [place]  Morbid obesity ([place]) -He has reportedly lost a significant amount of weight and is planning to discuss bariatric surgery.

## 2019-11-19 NOTE — Patient Instructions (Addendum)
Since you are having labs later this week at [facility], I am ok with them checking a complete metabolic panel and PSA and sending me the results.  Diagnosis for PSA is screening for prostate cancer.  CMP is for your annual physical, CHF, and HTN.   You should hear from [facility] soon to schedule.

## 2019-11-20 DIAGNOSIS — Z992 Dependence on renal dialysis: Secondary | ICD-10-CM | POA: Diagnosis not present

## 2019-11-20 DIAGNOSIS — N2581 Secondary hyperparathyroidism of renal origin: Secondary | ICD-10-CM | POA: Diagnosis not present

## 2019-11-20 DIAGNOSIS — N186 End stage renal disease: Secondary | ICD-10-CM | POA: Diagnosis not present

## 2019-11-21 DIAGNOSIS — Z992 Dependence on renal dialysis: Secondary | ICD-10-CM | POA: Diagnosis not present

## 2019-11-21 DIAGNOSIS — N186 End stage renal disease: Secondary | ICD-10-CM | POA: Diagnosis not present

## 2019-11-21 DIAGNOSIS — N2581 Secondary hyperparathyroidism of renal origin: Secondary | ICD-10-CM | POA: Diagnosis not present

## 2019-11-22 DIAGNOSIS — N2581 Secondary hyperparathyroidism of renal origin: Secondary | ICD-10-CM | POA: Diagnosis not present

## 2019-11-22 DIAGNOSIS — Z992 Dependence on renal dialysis: Secondary | ICD-10-CM | POA: Diagnosis not present

## 2019-11-22 DIAGNOSIS — N186 End stage renal disease: Secondary | ICD-10-CM | POA: Diagnosis not present

## 2019-11-23 DIAGNOSIS — Z992 Dependence on renal dialysis: Secondary | ICD-10-CM | POA: Diagnosis not present

## 2019-11-23 DIAGNOSIS — N2581 Secondary hyperparathyroidism of renal origin: Secondary | ICD-10-CM | POA: Diagnosis not present

## 2019-11-23 DIAGNOSIS — N186 End stage renal disease: Secondary | ICD-10-CM | POA: Diagnosis not present

## 2019-11-24 DIAGNOSIS — N2581 Secondary hyperparathyroidism of renal origin: Secondary | ICD-10-CM | POA: Diagnosis not present

## 2019-11-24 DIAGNOSIS — Z992 Dependence on renal dialysis: Secondary | ICD-10-CM | POA: Diagnosis not present

## 2019-11-24 DIAGNOSIS — N186 End stage renal disease: Secondary | ICD-10-CM | POA: Diagnosis not present

## 2019-11-25 DIAGNOSIS — N186 End stage renal disease: Secondary | ICD-10-CM | POA: Diagnosis not present

## 2019-11-25 DIAGNOSIS — Z992 Dependence on renal dialysis: Secondary | ICD-10-CM | POA: Diagnosis not present

## 2019-11-25 DIAGNOSIS — N2581 Secondary hyperparathyroidism of renal origin: Secondary | ICD-10-CM | POA: Diagnosis not present

## 2019-11-26 ENCOUNTER — Encounter: Payer: Self-pay | Admitting: Family Medicine

## 2019-11-26 DIAGNOSIS — N186 End stage renal disease: Secondary | ICD-10-CM | POA: Diagnosis not present

## 2019-11-26 DIAGNOSIS — N2581 Secondary hyperparathyroidism of renal origin: Secondary | ICD-10-CM | POA: Diagnosis not present

## 2019-11-26 DIAGNOSIS — Z992 Dependence on renal dialysis: Secondary | ICD-10-CM | POA: Diagnosis not present

## 2019-11-27 ENCOUNTER — Encounter: Payer: Self-pay | Admitting: Family Medicine

## 2019-11-27 DIAGNOSIS — Z992 Dependence on renal dialysis: Secondary | ICD-10-CM | POA: Diagnosis not present

## 2019-11-27 DIAGNOSIS — N186 End stage renal disease: Secondary | ICD-10-CM | POA: Diagnosis not present

## 2019-11-27 DIAGNOSIS — N2581 Secondary hyperparathyroidism of renal origin: Secondary | ICD-10-CM | POA: Diagnosis not present

## 2019-11-28 DIAGNOSIS — N186 End stage renal disease: Secondary | ICD-10-CM | POA: Diagnosis not present

## 2019-11-28 DIAGNOSIS — Z992 Dependence on renal dialysis: Secondary | ICD-10-CM | POA: Diagnosis not present

## 2019-11-28 DIAGNOSIS — N2581 Secondary hyperparathyroidism of renal origin: Secondary | ICD-10-CM | POA: Diagnosis not present

## 2019-11-29 DIAGNOSIS — N186 End stage renal disease: Secondary | ICD-10-CM | POA: Diagnosis not present

## 2019-11-29 DIAGNOSIS — Z992 Dependence on renal dialysis: Secondary | ICD-10-CM | POA: Diagnosis not present

## 2019-11-29 DIAGNOSIS — N2581 Secondary hyperparathyroidism of renal origin: Secondary | ICD-10-CM | POA: Diagnosis not present

## 2019-11-30 DIAGNOSIS — N186 End stage renal disease: Secondary | ICD-10-CM | POA: Diagnosis not present

## 2019-11-30 DIAGNOSIS — Z992 Dependence on renal dialysis: Secondary | ICD-10-CM | POA: Diagnosis not present

## 2019-11-30 DIAGNOSIS — N2581 Secondary hyperparathyroidism of renal origin: Secondary | ICD-10-CM | POA: Diagnosis not present

## 2019-12-01 DIAGNOSIS — N2581 Secondary hyperparathyroidism of renal origin: Secondary | ICD-10-CM | POA: Diagnosis not present

## 2019-12-01 DIAGNOSIS — Z992 Dependence on renal dialysis: Secondary | ICD-10-CM | POA: Diagnosis not present

## 2019-12-01 DIAGNOSIS — N186 End stage renal disease: Secondary | ICD-10-CM | POA: Diagnosis not present

## 2019-12-02 DIAGNOSIS — N2581 Secondary hyperparathyroidism of renal origin: Secondary | ICD-10-CM | POA: Diagnosis not present

## 2019-12-02 DIAGNOSIS — N186 End stage renal disease: Secondary | ICD-10-CM | POA: Diagnosis not present

## 2019-12-02 DIAGNOSIS — Z992 Dependence on renal dialysis: Secondary | ICD-10-CM | POA: Diagnosis not present

## 2019-12-03 DIAGNOSIS — Z992 Dependence on renal dialysis: Secondary | ICD-10-CM | POA: Diagnosis not present

## 2019-12-03 DIAGNOSIS — N186 End stage renal disease: Secondary | ICD-10-CM | POA: Diagnosis not present

## 2019-12-03 DIAGNOSIS — N2581 Secondary hyperparathyroidism of renal origin: Secondary | ICD-10-CM | POA: Diagnosis not present

## 2019-12-04 DIAGNOSIS — N186 End stage renal disease: Secondary | ICD-10-CM | POA: Diagnosis not present

## 2019-12-04 DIAGNOSIS — Z992 Dependence on renal dialysis: Secondary | ICD-10-CM | POA: Diagnosis not present

## 2019-12-04 DIAGNOSIS — N2581 Secondary hyperparathyroidism of renal origin: Secondary | ICD-10-CM | POA: Diagnosis not present

## 2019-12-05 DIAGNOSIS — N2581 Secondary hyperparathyroidism of renal origin: Secondary | ICD-10-CM | POA: Diagnosis not present

## 2019-12-05 DIAGNOSIS — Z992 Dependence on renal dialysis: Secondary | ICD-10-CM | POA: Diagnosis not present

## 2019-12-05 DIAGNOSIS — N186 End stage renal disease: Secondary | ICD-10-CM | POA: Diagnosis not present

## 2019-12-06 DIAGNOSIS — N186 End stage renal disease: Secondary | ICD-10-CM | POA: Diagnosis not present

## 2019-12-06 DIAGNOSIS — Z992 Dependence on renal dialysis: Secondary | ICD-10-CM | POA: Diagnosis not present

## 2019-12-06 DIAGNOSIS — N2581 Secondary hyperparathyroidism of renal origin: Secondary | ICD-10-CM | POA: Diagnosis not present

## 2019-12-07 DIAGNOSIS — N186 End stage renal disease: Secondary | ICD-10-CM | POA: Diagnosis not present

## 2019-12-07 DIAGNOSIS — N2581 Secondary hyperparathyroidism of renal origin: Secondary | ICD-10-CM | POA: Diagnosis not present

## 2019-12-07 DIAGNOSIS — Z992 Dependence on renal dialysis: Secondary | ICD-10-CM | POA: Diagnosis not present

## 2019-12-08 DIAGNOSIS — Z992 Dependence on renal dialysis: Secondary | ICD-10-CM | POA: Diagnosis not present

## 2019-12-08 DIAGNOSIS — N186 End stage renal disease: Secondary | ICD-10-CM | POA: Diagnosis not present

## 2019-12-08 DIAGNOSIS — N2581 Secondary hyperparathyroidism of renal origin: Secondary | ICD-10-CM | POA: Diagnosis not present

## 2019-12-09 DIAGNOSIS — N2581 Secondary hyperparathyroidism of renal origin: Secondary | ICD-10-CM | POA: Diagnosis not present

## 2019-12-09 DIAGNOSIS — N186 End stage renal disease: Secondary | ICD-10-CM | POA: Diagnosis not present

## 2019-12-09 DIAGNOSIS — Z992 Dependence on renal dialysis: Secondary | ICD-10-CM | POA: Diagnosis not present

## 2019-12-10 ENCOUNTER — Encounter: Payer: Self-pay | Admitting: Family Medicine

## 2019-12-10 DIAGNOSIS — Z992 Dependence on renal dialysis: Secondary | ICD-10-CM | POA: Diagnosis not present

## 2019-12-10 DIAGNOSIS — N186 End stage renal disease: Secondary | ICD-10-CM | POA: Diagnosis not present

## 2019-12-10 DIAGNOSIS — N2581 Secondary hyperparathyroidism of renal origin: Secondary | ICD-10-CM | POA: Diagnosis not present

## 2019-12-10 NOTE — Telephone Encounter (Signed)
Send the results to him.

## 2019-12-11 DIAGNOSIS — N2581 Secondary hyperparathyroidism of renal origin: Secondary | ICD-10-CM | POA: Diagnosis not present

## 2019-12-11 DIAGNOSIS — Z992 Dependence on renal dialysis: Secondary | ICD-10-CM | POA: Diagnosis not present

## 2019-12-11 DIAGNOSIS — N186 End stage renal disease: Secondary | ICD-10-CM | POA: Diagnosis not present

## 2019-12-12 DIAGNOSIS — Z992 Dependence on renal dialysis: Secondary | ICD-10-CM | POA: Diagnosis not present

## 2019-12-12 DIAGNOSIS — N186 End stage renal disease: Secondary | ICD-10-CM | POA: Diagnosis not present

## 2019-12-12 DIAGNOSIS — N2581 Secondary hyperparathyroidism of renal origin: Secondary | ICD-10-CM | POA: Diagnosis not present

## 2019-12-13 DIAGNOSIS — Z992 Dependence on renal dialysis: Secondary | ICD-10-CM | POA: Diagnosis not present

## 2019-12-13 DIAGNOSIS — Z6841 Body Mass Index (BMI) 40.0 and over, adult: Secondary | ICD-10-CM | POA: Diagnosis not present

## 2019-12-13 DIAGNOSIS — N2581 Secondary hyperparathyroidism of renal origin: Secondary | ICD-10-CM | POA: Diagnosis not present

## 2019-12-13 DIAGNOSIS — I110 Hypertensive heart disease with heart failure: Secondary | ICD-10-CM | POA: Diagnosis not present

## 2019-12-13 DIAGNOSIS — E119 Type 2 diabetes mellitus without complications: Secondary | ICD-10-CM | POA: Diagnosis not present

## 2019-12-13 DIAGNOSIS — N186 End stage renal disease: Secondary | ICD-10-CM | POA: Diagnosis not present

## 2019-12-14 DIAGNOSIS — N2581 Secondary hyperparathyroidism of renal origin: Secondary | ICD-10-CM | POA: Diagnosis not present

## 2019-12-14 DIAGNOSIS — E1122 Type 2 diabetes mellitus with diabetic chronic kidney disease: Secondary | ICD-10-CM | POA: Diagnosis not present

## 2019-12-14 DIAGNOSIS — N186 End stage renal disease: Secondary | ICD-10-CM | POA: Diagnosis not present

## 2019-12-14 DIAGNOSIS — Z992 Dependence on renal dialysis: Secondary | ICD-10-CM | POA: Diagnosis not present

## 2019-12-15 DIAGNOSIS — Z992 Dependence on renal dialysis: Secondary | ICD-10-CM | POA: Diagnosis not present

## 2019-12-15 DIAGNOSIS — N2581 Secondary hyperparathyroidism of renal origin: Secondary | ICD-10-CM | POA: Diagnosis not present

## 2019-12-15 DIAGNOSIS — N186 End stage renal disease: Secondary | ICD-10-CM | POA: Diagnosis not present

## 2019-12-16 DIAGNOSIS — N186 End stage renal disease: Secondary | ICD-10-CM | POA: Diagnosis not present

## 2019-12-16 DIAGNOSIS — Z992 Dependence on renal dialysis: Secondary | ICD-10-CM | POA: Diagnosis not present

## 2019-12-16 DIAGNOSIS — N2581 Secondary hyperparathyroidism of renal origin: Secondary | ICD-10-CM | POA: Diagnosis not present

## 2019-12-17 DIAGNOSIS — H34831 Tributary (branch) retinal vein occlusion, right eye, with macular edema: Secondary | ICD-10-CM | POA: Diagnosis not present

## 2019-12-17 DIAGNOSIS — H35033 Hypertensive retinopathy, bilateral: Secondary | ICD-10-CM | POA: Diagnosis not present

## 2019-12-17 DIAGNOSIS — Z992 Dependence on renal dialysis: Secondary | ICD-10-CM | POA: Diagnosis not present

## 2019-12-17 DIAGNOSIS — H43822 Vitreomacular adhesion, left eye: Secondary | ICD-10-CM | POA: Diagnosis not present

## 2019-12-17 DIAGNOSIS — N186 End stage renal disease: Secondary | ICD-10-CM | POA: Diagnosis not present

## 2019-12-17 DIAGNOSIS — N2581 Secondary hyperparathyroidism of renal origin: Secondary | ICD-10-CM | POA: Diagnosis not present

## 2019-12-17 DIAGNOSIS — H31092 Other chorioretinal scars, left eye: Secondary | ICD-10-CM | POA: Diagnosis not present

## 2019-12-18 DIAGNOSIS — N186 End stage renal disease: Secondary | ICD-10-CM | POA: Diagnosis not present

## 2019-12-18 DIAGNOSIS — Z992 Dependence on renal dialysis: Secondary | ICD-10-CM | POA: Diagnosis not present

## 2019-12-18 DIAGNOSIS — N2581 Secondary hyperparathyroidism of renal origin: Secondary | ICD-10-CM | POA: Diagnosis not present

## 2019-12-19 DIAGNOSIS — N2581 Secondary hyperparathyroidism of renal origin: Secondary | ICD-10-CM | POA: Diagnosis not present

## 2019-12-19 DIAGNOSIS — N186 End stage renal disease: Secondary | ICD-10-CM | POA: Diagnosis not present

## 2019-12-19 DIAGNOSIS — Z992 Dependence on renal dialysis: Secondary | ICD-10-CM | POA: Diagnosis not present

## 2019-12-20 DIAGNOSIS — Z992 Dependence on renal dialysis: Secondary | ICD-10-CM | POA: Diagnosis not present

## 2019-12-20 DIAGNOSIS — N186 End stage renal disease: Secondary | ICD-10-CM | POA: Diagnosis not present

## 2019-12-20 DIAGNOSIS — N2581 Secondary hyperparathyroidism of renal origin: Secondary | ICD-10-CM | POA: Diagnosis not present

## 2019-12-21 DIAGNOSIS — N186 End stage renal disease: Secondary | ICD-10-CM | POA: Diagnosis not present

## 2019-12-21 DIAGNOSIS — N2581 Secondary hyperparathyroidism of renal origin: Secondary | ICD-10-CM | POA: Diagnosis not present

## 2019-12-21 DIAGNOSIS — Z992 Dependence on renal dialysis: Secondary | ICD-10-CM | POA: Diagnosis not present

## 2019-12-22 DIAGNOSIS — Z992 Dependence on renal dialysis: Secondary | ICD-10-CM | POA: Diagnosis not present

## 2019-12-22 DIAGNOSIS — N186 End stage renal disease: Secondary | ICD-10-CM | POA: Diagnosis not present

## 2019-12-22 DIAGNOSIS — N2581 Secondary hyperparathyroidism of renal origin: Secondary | ICD-10-CM | POA: Diagnosis not present

## 2019-12-23 DIAGNOSIS — N2581 Secondary hyperparathyroidism of renal origin: Secondary | ICD-10-CM | POA: Diagnosis not present

## 2019-12-23 DIAGNOSIS — N186 End stage renal disease: Secondary | ICD-10-CM | POA: Diagnosis not present

## 2019-12-23 DIAGNOSIS — Z992 Dependence on renal dialysis: Secondary | ICD-10-CM | POA: Diagnosis not present

## 2019-12-24 DIAGNOSIS — N2581 Secondary hyperparathyroidism of renal origin: Secondary | ICD-10-CM | POA: Diagnosis not present

## 2019-12-24 DIAGNOSIS — Z992 Dependence on renal dialysis: Secondary | ICD-10-CM | POA: Diagnosis not present

## 2019-12-24 DIAGNOSIS — N186 End stage renal disease: Secondary | ICD-10-CM | POA: Diagnosis not present

## 2019-12-25 DIAGNOSIS — N2581 Secondary hyperparathyroidism of renal origin: Secondary | ICD-10-CM | POA: Diagnosis not present

## 2019-12-25 DIAGNOSIS — N186 End stage renal disease: Secondary | ICD-10-CM | POA: Diagnosis not present

## 2019-12-25 DIAGNOSIS — Z992 Dependence on renal dialysis: Secondary | ICD-10-CM | POA: Diagnosis not present

## 2019-12-26 DIAGNOSIS — N2581 Secondary hyperparathyroidism of renal origin: Secondary | ICD-10-CM | POA: Diagnosis not present

## 2019-12-26 DIAGNOSIS — Z992 Dependence on renal dialysis: Secondary | ICD-10-CM | POA: Diagnosis not present

## 2019-12-26 DIAGNOSIS — N186 End stage renal disease: Secondary | ICD-10-CM | POA: Diagnosis not present

## 2019-12-27 DIAGNOSIS — N186 End stage renal disease: Secondary | ICD-10-CM | POA: Diagnosis not present

## 2019-12-27 DIAGNOSIS — N2581 Secondary hyperparathyroidism of renal origin: Secondary | ICD-10-CM | POA: Diagnosis not present

## 2019-12-27 DIAGNOSIS — Z992 Dependence on renal dialysis: Secondary | ICD-10-CM | POA: Diagnosis not present

## 2019-12-28 DIAGNOSIS — N186 End stage renal disease: Secondary | ICD-10-CM | POA: Diagnosis not present

## 2019-12-28 DIAGNOSIS — N2581 Secondary hyperparathyroidism of renal origin: Secondary | ICD-10-CM | POA: Diagnosis not present

## 2019-12-28 DIAGNOSIS — Z992 Dependence on renal dialysis: Secondary | ICD-10-CM | POA: Diagnosis not present

## 2019-12-29 DIAGNOSIS — N2581 Secondary hyperparathyroidism of renal origin: Secondary | ICD-10-CM | POA: Diagnosis not present

## 2019-12-29 DIAGNOSIS — N186 End stage renal disease: Secondary | ICD-10-CM | POA: Diagnosis not present

## 2019-12-29 DIAGNOSIS — Z992 Dependence on renal dialysis: Secondary | ICD-10-CM | POA: Diagnosis not present

## 2019-12-29 DIAGNOSIS — Z4932 Encounter for adequacy testing for peritoneal dialysis: Secondary | ICD-10-CM | POA: Diagnosis not present

## 2019-12-30 DIAGNOSIS — N186 End stage renal disease: Secondary | ICD-10-CM | POA: Diagnosis not present

## 2019-12-30 DIAGNOSIS — N2581 Secondary hyperparathyroidism of renal origin: Secondary | ICD-10-CM | POA: Diagnosis not present

## 2019-12-30 DIAGNOSIS — Z4932 Encounter for adequacy testing for peritoneal dialysis: Secondary | ICD-10-CM | POA: Diagnosis not present

## 2019-12-30 DIAGNOSIS — Z992 Dependence on renal dialysis: Secondary | ICD-10-CM | POA: Diagnosis not present

## 2019-12-31 DIAGNOSIS — N2581 Secondary hyperparathyroidism of renal origin: Secondary | ICD-10-CM | POA: Diagnosis not present

## 2019-12-31 DIAGNOSIS — Z992 Dependence on renal dialysis: Secondary | ICD-10-CM | POA: Diagnosis not present

## 2019-12-31 DIAGNOSIS — Z4932 Encounter for adequacy testing for peritoneal dialysis: Secondary | ICD-10-CM | POA: Diagnosis not present

## 2019-12-31 DIAGNOSIS — N186 End stage renal disease: Secondary | ICD-10-CM | POA: Diagnosis not present

## 2020-01-01 DIAGNOSIS — Z4932 Encounter for adequacy testing for peritoneal dialysis: Secondary | ICD-10-CM | POA: Diagnosis not present

## 2020-01-01 DIAGNOSIS — N186 End stage renal disease: Secondary | ICD-10-CM | POA: Diagnosis not present

## 2020-01-01 DIAGNOSIS — Z992 Dependence on renal dialysis: Secondary | ICD-10-CM | POA: Diagnosis not present

## 2020-01-01 DIAGNOSIS — N2581 Secondary hyperparathyroidism of renal origin: Secondary | ICD-10-CM | POA: Diagnosis not present

## 2020-01-02 ENCOUNTER — Encounter: Payer: Self-pay | Admitting: Family Medicine

## 2020-01-02 ENCOUNTER — Ambulatory Visit: Payer: Self-pay | Admitting: Cardiology

## 2020-01-02 DIAGNOSIS — N186 End stage renal disease: Secondary | ICD-10-CM | POA: Diagnosis not present

## 2020-01-02 DIAGNOSIS — N2581 Secondary hyperparathyroidism of renal origin: Secondary | ICD-10-CM | POA: Diagnosis not present

## 2020-01-02 DIAGNOSIS — Z992 Dependence on renal dialysis: Secondary | ICD-10-CM | POA: Diagnosis not present

## 2020-01-02 DIAGNOSIS — Z4932 Encounter for adequacy testing for peritoneal dialysis: Secondary | ICD-10-CM | POA: Diagnosis not present

## 2020-01-03 DIAGNOSIS — N2581 Secondary hyperparathyroidism of renal origin: Secondary | ICD-10-CM | POA: Diagnosis not present

## 2020-01-03 DIAGNOSIS — Z4932 Encounter for adequacy testing for peritoneal dialysis: Secondary | ICD-10-CM | POA: Diagnosis not present

## 2020-01-03 DIAGNOSIS — Z992 Dependence on renal dialysis: Secondary | ICD-10-CM | POA: Diagnosis not present

## 2020-01-03 DIAGNOSIS — N186 End stage renal disease: Secondary | ICD-10-CM | POA: Diagnosis not present

## 2020-01-04 DIAGNOSIS — N186 End stage renal disease: Secondary | ICD-10-CM | POA: Diagnosis not present

## 2020-01-04 DIAGNOSIS — Z992 Dependence on renal dialysis: Secondary | ICD-10-CM | POA: Diagnosis not present

## 2020-01-04 DIAGNOSIS — Z4932 Encounter for adequacy testing for peritoneal dialysis: Secondary | ICD-10-CM | POA: Diagnosis not present

## 2020-01-04 DIAGNOSIS — N2581 Secondary hyperparathyroidism of renal origin: Secondary | ICD-10-CM | POA: Diagnosis not present

## 2020-01-05 DIAGNOSIS — Z992 Dependence on renal dialysis: Secondary | ICD-10-CM | POA: Diagnosis not present

## 2020-01-05 DIAGNOSIS — N2581 Secondary hyperparathyroidism of renal origin: Secondary | ICD-10-CM | POA: Diagnosis not present

## 2020-01-05 DIAGNOSIS — D631 Anemia in chronic kidney disease: Secondary | ICD-10-CM | POA: Diagnosis not present

## 2020-01-05 DIAGNOSIS — N186 End stage renal disease: Secondary | ICD-10-CM | POA: Diagnosis not present

## 2020-01-06 DIAGNOSIS — N186 End stage renal disease: Secondary | ICD-10-CM | POA: Diagnosis not present

## 2020-01-06 DIAGNOSIS — N2581 Secondary hyperparathyroidism of renal origin: Secondary | ICD-10-CM | POA: Diagnosis not present

## 2020-01-06 DIAGNOSIS — Z992 Dependence on renal dialysis: Secondary | ICD-10-CM | POA: Diagnosis not present

## 2020-01-06 DIAGNOSIS — D631 Anemia in chronic kidney disease: Secondary | ICD-10-CM | POA: Diagnosis not present

## 2020-01-07 DIAGNOSIS — N186 End stage renal disease: Secondary | ICD-10-CM | POA: Diagnosis not present

## 2020-01-07 DIAGNOSIS — D631 Anemia in chronic kidney disease: Secondary | ICD-10-CM | POA: Diagnosis not present

## 2020-01-07 DIAGNOSIS — Z992 Dependence on renal dialysis: Secondary | ICD-10-CM | POA: Diagnosis not present

## 2020-01-07 DIAGNOSIS — N2581 Secondary hyperparathyroidism of renal origin: Secondary | ICD-10-CM | POA: Diagnosis not present

## 2020-01-08 DIAGNOSIS — Z992 Dependence on renal dialysis: Secondary | ICD-10-CM | POA: Diagnosis not present

## 2020-01-08 DIAGNOSIS — N186 End stage renal disease: Secondary | ICD-10-CM | POA: Diagnosis not present

## 2020-01-08 DIAGNOSIS — N2581 Secondary hyperparathyroidism of renal origin: Secondary | ICD-10-CM | POA: Diagnosis not present

## 2020-01-08 DIAGNOSIS — D631 Anemia in chronic kidney disease: Secondary | ICD-10-CM | POA: Diagnosis not present

## 2020-01-09 DIAGNOSIS — Z992 Dependence on renal dialysis: Secondary | ICD-10-CM | POA: Diagnosis not present

## 2020-01-09 DIAGNOSIS — D631 Anemia in chronic kidney disease: Secondary | ICD-10-CM | POA: Diagnosis not present

## 2020-01-09 DIAGNOSIS — N2581 Secondary hyperparathyroidism of renal origin: Secondary | ICD-10-CM | POA: Diagnosis not present

## 2020-01-09 DIAGNOSIS — N186 End stage renal disease: Secondary | ICD-10-CM | POA: Diagnosis not present

## 2020-01-10 DIAGNOSIS — N186 End stage renal disease: Secondary | ICD-10-CM | POA: Diagnosis not present

## 2020-01-10 DIAGNOSIS — N2581 Secondary hyperparathyroidism of renal origin: Secondary | ICD-10-CM | POA: Diagnosis not present

## 2020-01-10 DIAGNOSIS — D631 Anemia in chronic kidney disease: Secondary | ICD-10-CM | POA: Diagnosis not present

## 2020-01-10 DIAGNOSIS — Z992 Dependence on renal dialysis: Secondary | ICD-10-CM | POA: Diagnosis not present

## 2020-01-11 DIAGNOSIS — N186 End stage renal disease: Secondary | ICD-10-CM | POA: Diagnosis not present

## 2020-01-11 DIAGNOSIS — Z992 Dependence on renal dialysis: Secondary | ICD-10-CM | POA: Diagnosis not present

## 2020-01-11 DIAGNOSIS — N2581 Secondary hyperparathyroidism of renal origin: Secondary | ICD-10-CM | POA: Diagnosis not present

## 2020-01-11 DIAGNOSIS — D631 Anemia in chronic kidney disease: Secondary | ICD-10-CM | POA: Diagnosis not present

## 2020-01-12 DIAGNOSIS — N2581 Secondary hyperparathyroidism of renal origin: Secondary | ICD-10-CM | POA: Diagnosis not present

## 2020-01-12 DIAGNOSIS — N186 End stage renal disease: Secondary | ICD-10-CM | POA: Diagnosis not present

## 2020-01-12 DIAGNOSIS — Z4932 Encounter for adequacy testing for peritoneal dialysis: Secondary | ICD-10-CM | POA: Diagnosis not present

## 2020-01-12 DIAGNOSIS — Z992 Dependence on renal dialysis: Secondary | ICD-10-CM | POA: Diagnosis not present

## 2020-01-13 DIAGNOSIS — N2581 Secondary hyperparathyroidism of renal origin: Secondary | ICD-10-CM | POA: Diagnosis not present

## 2020-01-13 DIAGNOSIS — N186 End stage renal disease: Secondary | ICD-10-CM | POA: Diagnosis not present

## 2020-01-13 DIAGNOSIS — Z4932 Encounter for adequacy testing for peritoneal dialysis: Secondary | ICD-10-CM | POA: Diagnosis not present

## 2020-01-13 DIAGNOSIS — Z992 Dependence on renal dialysis: Secondary | ICD-10-CM | POA: Diagnosis not present

## 2020-01-14 DIAGNOSIS — Z992 Dependence on renal dialysis: Secondary | ICD-10-CM | POA: Diagnosis not present

## 2020-01-14 DIAGNOSIS — Z4932 Encounter for adequacy testing for peritoneal dialysis: Secondary | ICD-10-CM | POA: Diagnosis not present

## 2020-01-14 DIAGNOSIS — N186 End stage renal disease: Secondary | ICD-10-CM | POA: Diagnosis not present

## 2020-01-14 DIAGNOSIS — Z20822 Contact with and (suspected) exposure to covid-19: Secondary | ICD-10-CM | POA: Diagnosis not present

## 2020-01-14 DIAGNOSIS — N2581 Secondary hyperparathyroidism of renal origin: Secondary | ICD-10-CM | POA: Diagnosis not present

## 2020-01-14 DIAGNOSIS — E1122 Type 2 diabetes mellitus with diabetic chronic kidney disease: Secondary | ICD-10-CM | POA: Diagnosis not present

## 2020-01-15 ENCOUNTER — Encounter: Payer: Self-pay | Admitting: Family Medicine

## 2020-01-15 ENCOUNTER — Telehealth (INDEPENDENT_AMBULATORY_CARE_PROVIDER_SITE_OTHER): Payer: Federal, State, Local not specified - PPO | Admitting: Family Medicine

## 2020-01-15 VITALS — BP 142/69 | HR 101 | Temp 98.9°F | Wt 278.0 lb

## 2020-01-15 DIAGNOSIS — R0602 Shortness of breath: Secondary | ICD-10-CM | POA: Diagnosis not present

## 2020-01-15 DIAGNOSIS — Z20822 Contact with and (suspected) exposure to covid-19: Secondary | ICD-10-CM | POA: Diagnosis not present

## 2020-01-15 DIAGNOSIS — N186 End stage renal disease: Secondary | ICD-10-CM | POA: Diagnosis not present

## 2020-01-15 DIAGNOSIS — R6883 Chills (without fever): Secondary | ICD-10-CM | POA: Diagnosis not present

## 2020-01-15 DIAGNOSIS — Z992 Dependence on renal dialysis: Secondary | ICD-10-CM | POA: Diagnosis not present

## 2020-01-15 DIAGNOSIS — N2581 Secondary hyperparathyroidism of renal origin: Secondary | ICD-10-CM | POA: Diagnosis not present

## 2020-01-15 NOTE — Progress Notes (Signed)
° °  Subjective:  Documentation for virtual audio - he was unable to connect video  The patient was located at home. 2 patient identifiers used.  The provider was located in the office. The patient did consent to this visit and is aware of possible charges through their insurance for this visit.  The other persons participating in this telemedicine service were none. Time spent on call was 12 minutes and in review of previous records 15 minutes total.  This virtual service is not related to other E/M service within previous 7 days.   Patient ID: Adrian Frye, male    DOB: Jul 19, 1970, 49 y.o.   MRN: [id number]  HPI Chief Complaint  Patient presents with   sick    fever and up and down, chills, breathing deeply if walking up steps. feels like maybe upper respirstory. had covid test done yesterday and was neg but waiting on PCR test to come back too. feeling somewhat better today   Complains of a one week hx of shortness of breath with strenuous activity. Walking on a flat surface is ok. States he was having issues with regulating his temperature, having hot and cold flashes. States he had chills last night. Mild headache and decreased appetite.  No actual fever, body aches, chest pain, palpitations, abdominal pain, nausea, vomiting or diarrhea.  No loss of taste of smell.   States he is waiting on PCR test from [facility] but he had a negative rapid Covid test.   Reviewed allergies, medications, past medical, surgical, family, and social history.     Review of Systems Pertinent positives and negatives in the history of present illness.     Objective:   Physical Exam BP (!) 142/69    Pulse (!) 101    Temp 98.9 F (37.2 C)    Wt 278 lb (126.1 kg)    BMI 44.20 kg/m   Alert and oriented and in no acute distress.  Respirations unlabored.  Speaking complete sentence without difficulty.      Assessment & Plan:  Suspected COVID-19 virus infection  Chills  Shortness  of breath on exertion  We are awaiting his PCR Covid test.  He should have this back later today.  He is not in any acute distress.  Discussed symptomatic treatment.  He will let me know the result of this test and if he is positive, he is aware that he would most likely qualify for the IV monoclonal antibody infusion.

## 2020-01-16 ENCOUNTER — Encounter: Payer: Self-pay | Admitting: Family Medicine

## 2020-01-16 DIAGNOSIS — N186 End stage renal disease: Secondary | ICD-10-CM | POA: Diagnosis not present

## 2020-01-16 DIAGNOSIS — Z992 Dependence on renal dialysis: Secondary | ICD-10-CM | POA: Diagnosis not present

## 2020-01-16 DIAGNOSIS — N2581 Secondary hyperparathyroidism of renal origin: Secondary | ICD-10-CM | POA: Diagnosis not present

## 2020-01-17 DIAGNOSIS — Z992 Dependence on renal dialysis: Secondary | ICD-10-CM | POA: Diagnosis not present

## 2020-01-17 DIAGNOSIS — N186 End stage renal disease: Secondary | ICD-10-CM | POA: Diagnosis not present

## 2020-01-17 DIAGNOSIS — N2581 Secondary hyperparathyroidism of renal origin: Secondary | ICD-10-CM | POA: Diagnosis not present

## 2020-01-18 DIAGNOSIS — N2581 Secondary hyperparathyroidism of renal origin: Secondary | ICD-10-CM | POA: Diagnosis not present

## 2020-01-18 DIAGNOSIS — N186 End stage renal disease: Secondary | ICD-10-CM | POA: Diagnosis not present

## 2020-01-18 DIAGNOSIS — Z992 Dependence on renal dialysis: Secondary | ICD-10-CM | POA: Diagnosis not present

## 2020-01-19 ENCOUNTER — Encounter: Payer: Self-pay | Admitting: Nephrology

## 2020-01-19 DIAGNOSIS — N2581 Secondary hyperparathyroidism of renal origin: Secondary | ICD-10-CM | POA: Diagnosis not present

## 2020-01-19 DIAGNOSIS — N186 End stage renal disease: Secondary | ICD-10-CM | POA: Diagnosis not present

## 2020-01-19 DIAGNOSIS — Z992 Dependence on renal dialysis: Secondary | ICD-10-CM | POA: Diagnosis not present

## 2020-01-19 NOTE — Progress Notes (Signed)
Patient seen at outpatient PD clinic 9/3 by RN. Has been feeling poorly for past several days. Blood cultures taken. I was called on 9/4 and patient had fever and was requesting anabiotics. No localizing symptoms. No abd pain or cloudy effluent. Started cephalexin 250 mg b.i.d. Today notified that patient had positive blood culture, GPC's. Patient informed to present the ER, has tunnel dialysis catheter in addition to PD catheter.   Wil l likely require TTE, TDC removal, IV ABX.

## 2020-01-20 ENCOUNTER — Inpatient Hospital Stay (HOSPITAL_COMMUNITY): Payer: Federal, State, Local not specified - PPO

## 2020-01-20 ENCOUNTER — Inpatient Hospital Stay (HOSPITAL_COMMUNITY)
Admission: EM | Admit: 2020-01-20 | Discharge: 2020-01-24 | DRG: 314 | Disposition: A | Discharge status: Home Health | Payer: Federal, State, Local not specified - PPO | Attending: Internal Medicine | Admitting: Internal Medicine

## 2020-01-20 ENCOUNTER — Other Ambulatory Visit: Payer: Self-pay

## 2020-01-20 DIAGNOSIS — N2581 Secondary hyperparathyroidism of renal origin: Secondary | ICD-10-CM | POA: Diagnosis not present

## 2020-01-20 DIAGNOSIS — K5900 Constipation, unspecified: Secondary | ICD-10-CM | POA: Diagnosis present

## 2020-01-20 DIAGNOSIS — Z6841 Body Mass Index (BMI) 40.0 and over, adult: Secondary | ICD-10-CM | POA: Diagnosis not present

## 2020-01-20 DIAGNOSIS — I5032 Chronic diastolic (congestive) heart failure: Secondary | ICD-10-CM | POA: Diagnosis not present

## 2020-01-20 DIAGNOSIS — I120 Hypertensive chronic kidney disease with stage 5 chronic kidney disease or end stage renal disease: Secondary | ICD-10-CM | POA: Diagnosis not present

## 2020-01-20 DIAGNOSIS — Z87891 Personal history of nicotine dependence: Secondary | ICD-10-CM | POA: Diagnosis not present

## 2020-01-20 DIAGNOSIS — T80211A Bloodstream infection due to central venous catheter, initial encounter: Principal | ICD-10-CM | POA: Diagnosis present

## 2020-01-20 DIAGNOSIS — I38 Endocarditis, valve unspecified: Secondary | ICD-10-CM

## 2020-01-20 DIAGNOSIS — I330 Acute and subacute infective endocarditis: Secondary | ICD-10-CM | POA: Diagnosis not present

## 2020-01-20 DIAGNOSIS — D638 Anemia in other chronic diseases classified elsewhere: Secondary | ICD-10-CM | POA: Diagnosis not present

## 2020-01-20 DIAGNOSIS — N186 End stage renal disease: Secondary | ICD-10-CM | POA: Diagnosis present

## 2020-01-20 DIAGNOSIS — I081 Rheumatic disorders of both mitral and tricuspid valves: Secondary | ICD-10-CM | POA: Diagnosis not present

## 2020-01-20 DIAGNOSIS — Z79899 Other long term (current) drug therapy: Secondary | ICD-10-CM

## 2020-01-20 DIAGNOSIS — A4101 Sepsis due to Methicillin susceptible Staphylococcus aureus: Secondary | ICD-10-CM | POA: Diagnosis not present

## 2020-01-20 DIAGNOSIS — Z8249 Family history of ischemic heart disease and other diseases of the circulatory system: Secondary | ICD-10-CM

## 2020-01-20 DIAGNOSIS — E785 Hyperlipidemia, unspecified: Secondary | ICD-10-CM | POA: Diagnosis present

## 2020-01-20 DIAGNOSIS — E876 Hypokalemia: Secondary | ICD-10-CM | POA: Diagnosis present

## 2020-01-20 DIAGNOSIS — N250 Renal osteodystrophy: Secondary | ICD-10-CM | POA: Diagnosis not present

## 2020-01-20 DIAGNOSIS — E871 Hypo-osmolality and hyponatremia: Secondary | ICD-10-CM | POA: Diagnosis present

## 2020-01-20 DIAGNOSIS — Y718 Miscellaneous cardiovascular devices associated with adverse incidents, not elsewhere classified: Secondary | ICD-10-CM | POA: Diagnosis present

## 2020-01-20 DIAGNOSIS — I371 Nonrheumatic pulmonary valve insufficiency: Secondary | ICD-10-CM | POA: Diagnosis present

## 2020-01-20 DIAGNOSIS — Y838 Other surgical procedures as the cause of abnormal reaction of the patient, or of later complication, without mention of misadventure at the time of the procedure: Secondary | ICD-10-CM | POA: Diagnosis present

## 2020-01-20 DIAGNOSIS — D696 Thrombocytopenia, unspecified: Secondary | ICD-10-CM | POA: Diagnosis not present

## 2020-01-20 DIAGNOSIS — Z20822 Contact with and (suspected) exposure to covid-19: Secondary | ICD-10-CM | POA: Diagnosis not present

## 2020-01-20 DIAGNOSIS — R7881 Bacteremia: Secondary | ICD-10-CM

## 2020-01-20 DIAGNOSIS — Z992 Dependence on renal dialysis: Secondary | ICD-10-CM | POA: Diagnosis not present

## 2020-01-20 DIAGNOSIS — G4733 Obstructive sleep apnea (adult) (pediatric): Secondary | ICD-10-CM | POA: Diagnosis not present

## 2020-01-20 DIAGNOSIS — R000 Tachycardia, unspecified: Secondary | ICD-10-CM | POA: Diagnosis not present

## 2020-01-20 DIAGNOSIS — E872 Acidosis: Secondary | ICD-10-CM | POA: Diagnosis not present

## 2020-01-20 DIAGNOSIS — Z888 Allergy status to other drugs, medicaments and biological substances status: Secondary | ICD-10-CM | POA: Diagnosis not present

## 2020-01-20 DIAGNOSIS — R34 Anuria and oliguria: Secondary | ICD-10-CM

## 2020-01-20 DIAGNOSIS — Z452 Encounter for adjustment and management of vascular access device: Secondary | ICD-10-CM | POA: Diagnosis not present

## 2020-01-20 DIAGNOSIS — I10 Essential (primary) hypertension: Secondary | ICD-10-CM | POA: Diagnosis present

## 2020-01-20 DIAGNOSIS — D631 Anemia in chronic kidney disease: Secondary | ICD-10-CM | POA: Diagnosis present

## 2020-01-20 DIAGNOSIS — Z833 Family history of diabetes mellitus: Secondary | ICD-10-CM

## 2020-01-20 DIAGNOSIS — I132 Hypertensive heart and chronic kidney disease with heart failure and with stage 5 chronic kidney disease, or end stage renal disease: Secondary | ICD-10-CM | POA: Diagnosis present

## 2020-01-20 DIAGNOSIS — J45909 Unspecified asthma, uncomplicated: Secondary | ICD-10-CM | POA: Diagnosis present

## 2020-01-20 DIAGNOSIS — I447 Left bundle-branch block, unspecified: Secondary | ICD-10-CM | POA: Diagnosis present

## 2020-01-20 DIAGNOSIS — Z4901 Encounter for fitting and adjustment of extracorporeal dialysis catheter: Secondary | ICD-10-CM | POA: Diagnosis not present

## 2020-01-20 LAB — COMPREHENSIVE METABOLIC PANEL
ALT: 44 U/L (ref 0–44)
AST: 28 U/L (ref 15–41)
Albumin: 2.4 g/dL — ABNORMAL LOW (ref 3.5–5.0)
Alkaline Phosphatase: 99 U/L (ref 38–126)
Anion gap: 21 — ABNORMAL HIGH (ref 5–15)
BUN: 78 mg/dL — ABNORMAL HIGH (ref 6–20)
CO2: 18 mmol/L — ABNORMAL LOW (ref 22–32)
Calcium: 7.7 mg/dL — ABNORMAL LOW (ref 8.9–10.3)
Chloride: 89 mmol/L — ABNORMAL LOW (ref 98–111)
Creatinine, Ser: 16.98 mg/dL — ABNORMAL HIGH (ref 0.61–1.24)
GFR calc Af Amer: 3 mL/min — ABNORMAL LOW (ref >60)
GFR calc non Af Amer: 3 mL/min — ABNORMAL LOW (ref >60)
Glucose, Bld: 141 mg/dL — ABNORMAL HIGH (ref 70–99)
Potassium: 3 mmol/L — ABNORMAL LOW (ref 3.5–5.1)
Sodium: 128 mmol/L — ABNORMAL LOW (ref 135–145)
Total Bilirubin: 0.5 mg/dL (ref 0.3–1.2)
Total Protein: 6.7 g/dL (ref 6.5–8.1)

## 2020-01-20 LAB — CBC WITH DIFFERENTIAL/PLATELET
Abs Immature Granulocytes: 0.64 10*3/uL — ABNORMAL HIGH (ref 0.00–0.07)
Basophils Absolute: 0.1 10*3/uL (ref 0.0–0.1)
Basophils Relative: 0 %
Eosinophils Absolute: 0.1 10*3/uL (ref 0.0–0.5)
Eosinophils Relative: 1 %
HCT: 30.9 % — ABNORMAL LOW (ref 39.0–52.0)
Hemoglobin: 9.9 g/dL — ABNORMAL LOW (ref 13.0–17.0)
Immature Granulocytes: 4 %
Lymphocytes Relative: 8 %
Lymphs Abs: 1.5 10*3/uL (ref 0.7–4.0)
MCH: 28.2 pg (ref 26.0–34.0)
MCHC: 32 g/dL (ref 30.0–36.0)
MCV: 88 fL (ref 80.0–100.0)
Monocytes Absolute: 2.3 10*3/uL — ABNORMAL HIGH (ref 0.1–1.0)
Monocytes Relative: 13 %
Neutro Abs: 13.4 10*3/uL — ABNORMAL HIGH (ref 1.7–7.7)
Neutrophils Relative %: 74 %
Platelets: 44 10*3/uL — ABNORMAL LOW (ref 150–400)
RBC: 3.51 MIL/uL — ABNORMAL LOW (ref 4.22–5.81)
RDW: 17.8 % — ABNORMAL HIGH (ref 11.5–15.5)
WBC: 18.1 10*3/uL — ABNORMAL HIGH (ref 4.0–10.5)
nRBC: 0 % (ref 0.0–0.2)

## 2020-01-20 LAB — BODY FLUID CELL COUNT WITH DIFFERENTIAL
Lymphs, Fluid: 4 %
Monocyte-Macrophage-Serous Fluid: 77 % (ref 50–90)
Neutrophil Count, Fluid: 19 % (ref 0–25)
Total Nucleated Cell Count, Fluid: 33 cu mm (ref 0–1000)

## 2020-01-20 LAB — SARS CORONAVIRUS 2 BY RT PCR (HOSPITAL ORDER, PERFORMED IN ~~LOC~~ HOSPITAL LAB): SARS Coronavirus 2: NEGATIVE

## 2020-01-20 LAB — LACTIC ACID, PLASMA
Lactic Acid, Venous: 1.3 mmol/L (ref 0.5–1.9)
Lactic Acid, Venous: 2.3 mmol/L (ref 0.5–1.9)

## 2020-01-20 LAB — ECHOCARDIOGRAM COMPLETE
Area-P 1/2: 4.86 cm2
Height: 67 in
S' Lateral: 3 cm
Weight: 4384 oz

## 2020-01-20 LAB — MAGNESIUM: Magnesium: 2 mg/dL (ref 1.7–2.4)

## 2020-01-20 MED ORDER — ONDANSETRON HCL 4 MG PO TABS: 4.0000 mg | ORAL_TABLET | Freq: Four times a day (QID) | ORAL | Status: DC | PRN

## 2020-01-20 MED ORDER — SODIUM CHLORIDE 0.9 % IV BOLUS
500.0000 mL | Freq: Once | INTRAVENOUS | Status: AC
  Administered 2020-01-20: 500 mL via INTRAVENOUS

## 2020-01-20 MED ORDER — PERFLUTREN LIPID MICROSPHERE
1.0000 mL | INTRAVENOUS | Status: AC | PRN
  Administered 2020-01-20: 2 mL via INTRAVENOUS
  Filled 2020-01-20: qty 10

## 2020-01-20 MED ORDER — DELFLEX-LC/1.5% DEXTROSE 344 MOSM/L IP SOLN: Freq: Every day | INTRAPERITONEAL | Status: DC

## 2020-01-20 MED ORDER — DELFLEX-LC/1.5% DEXTROSE 344 MOSM/L IP SOLN: INTRAPERITONEAL | Status: DC

## 2020-01-20 MED ORDER — CALCITRIOL 0.5 MCG PO CAPS
0.5000 ug | ORAL_CAPSULE | Freq: Every day | ORAL | Status: DC
  Administered 2020-01-20 – 2020-01-24 (×5): 0.5 ug via ORAL
  Filled 2020-01-20 (×5): qty 1

## 2020-01-20 MED ORDER — CHLORHEXIDINE GLUCONATE CLOTH 2 % EX PADS
6.0000 | MEDICATED_PAD | Freq: Every day | CUTANEOUS | Status: DC
  Administered 2020-01-21 – 2020-01-24 (×2): 6 via TOPICAL

## 2020-01-20 MED ORDER — FERRIC CITRATE 1 GM 210 MG(FE) PO TABS
840.0000 mg | ORAL_TABLET | Freq: Three times a day (TID) | ORAL | Status: DC
  Administered 2020-01-20 – 2020-01-23 (×8): 840 mg via ORAL
  Filled 2020-01-20 (×9): qty 4

## 2020-01-20 MED ORDER — VANCOMYCIN VARIABLE DOSE PER UNSTABLE RENAL FUNCTION (PHARMACIST DOSING): Status: DC

## 2020-01-20 MED ORDER — ACETAMINOPHEN 325 MG PO TABS: 650.0000 mg | ORAL_TABLET | Freq: Four times a day (QID) | ORAL | Status: DC | PRN

## 2020-01-20 MED ORDER — VANCOMYCIN HCL 500 MG/100ML IV SOLN
500.0000 mg | Freq: Once | INTRAVENOUS | Status: AC
  Administered 2020-01-20: 500 mg via INTRAVENOUS
  Filled 2020-01-20: qty 100

## 2020-01-20 MED ORDER — POTASSIUM CHLORIDE 20 MEQ PO PACK
40.0000 meq | PACK | Freq: Once | ORAL | Status: AC
  Administered 2020-01-20: 40 meq via ORAL
  Filled 2020-01-20: qty 2

## 2020-01-20 MED ORDER — GENTAMICIN SULFATE 0.1 % EX CREA
1.0000 "application " | TOPICAL_CREAM | Freq: Every day | CUTANEOUS | Status: DC
  Filled 2020-01-20: qty 15

## 2020-01-20 MED ORDER — ONDANSETRON HCL 4 MG/2ML IJ SOLN: 4.0000 mg | Freq: Four times a day (QID) | INTRAMUSCULAR | Status: DC | PRN

## 2020-01-20 MED ORDER — ACETAMINOPHEN 650 MG RE SUPP: 650.0000 mg | Freq: Four times a day (QID) | RECTAL | Status: DC | PRN

## 2020-01-20 MED ORDER — SODIUM CHLORIDE 0.9 % IV SOLN: INTRAVENOUS | Status: DC

## 2020-01-20 MED ORDER — VANCOMYCIN HCL 2000 MG/400ML IV SOLN
2000.0000 mg | Freq: Once | INTRAVENOUS | Status: AC
  Administered 2020-01-20: 2000 mg via INTRAVENOUS
  Filled 2020-01-20: qty 400

## 2020-01-20 MED ORDER — HEPARIN 1000 UNIT/ML FOR PERITONEAL DIALYSIS: 500.0000 [IU] | INTRAMUSCULAR | Status: DC | PRN

## 2020-01-20 NOTE — ED Provider Notes (Signed)
[name] EMERGENCY DEPARTMENT Provider Note   CSN: [id number] Arrival date & time: 01/20/20  3212     History Chief Complaint  Patient presents with  . Vascular Access Problem    Adrian Frye is a 49 y.o. male presenting for evaluation of blood infection.   Patient states of the past several weeks he has had increasing lethargy, weakness, decreased urine output, and intermittent fevers.  Initially he had chest discomfort, worse with inspiration, but this is since resolved.  Symptoms began after changing his peritoneal dialysis treatment.  He had monthly blood work drawn recently, concern for infection.  He was started on cephalexin to 50 mg twice daily on 9-3, reports slight improvement of symptoms since then.  Blood cultures were obtained, and then found to be positive.  He was told to come to the ER for further management.  Reports continued decreased urinary output.  He denies current chest pain, shortness breath, cough, nausea, vomiting, abdominal pain.  No change in bowel movements.  Additional history obtained from chart review.  Patient with a history of ESRD on peritoneal dialysis, asthma, OSA, cardiomyopathy, CHF.   HPI     Past Medical History:  Diagnosis Date  . Allergic rhinitis   . Allergy   . Anemia   . Asthma    as a child  . Benign essential hypertension   . Chronic kidney disease    stage III-IV  . Dilated idiopathic cardiomyopathy ([place])    now resoved with EF 55% by echo 2011  . Edema   . Encounter for blood transfusion   . Heart disease   . Hyperlipidemia   . Hyperparathyroidism, secondary ([place])   . Hypertension   . Morbid obesity ([place])   . Sleep apnea    c-pap  . UGI bleed 2011   ASA    Patient Active Problem List   Diagnosis Date Noted  . Bacteremia 01/20/2020  . Hyperlipidemia   . Hypokalemia   . Hyponatremia   . Thrombocytopenia ([place])   . Bacteremia due to Gram-positive bacteria   . Anemia of chronic disease    . History of asthma 11/19/2019  . CHF (congestive heart failure) ([place])   . Encounter for hemodialysis for ESRD ([place]) 11/08/2017  . ESRD on peritoneal dialysis ([place]) 11/08/2017  . Acute prerenal azotemia 11/02/2017  . Chronic diastolic heart failure ([place]) 04/17/2013  . Edema   . Hypertension   . Dilated idiopathic cardiomyopathy ([place])   . Morbid obesity ([place]) 08/05/2012  . ASTHMA 10/28/2007  . ALLERGIC RHINITIS 10/18/2007  . Obstructive sleep apnea 10/18/2007    Past Surgical History:  Procedure Laterality Date  . ACHILLES TENDON REPAIR     ruptured; right  . AV FISTULA PLACEMENT Left 11/03/2017   Procedure: ARTERIOVENOUS (AV) FISTULA CREATION  left upper arm;  Surgeon: Rosetta Posner, MD;  Location: [facility];  Service: Vascular;  Laterality: Left;  . DIALYSIS FISTULA CREATION    . INSERTION OF DIALYSIS CATHETER N/A 11/03/2017   Procedure: INSERTION OF DIALYSIS CATHETER - RIGHT INTERNAL JUGULAR PLACEMENT;  Surgeon: Rosetta Posner, MD;  Location: [facility] OR;  Service: Vascular;  Laterality: N/A;  . KIDNEY FAILURE    . LAPAROSCOPIC GASTROTOMY W/ REPAIR OF ULCER    . PLEURAL SCARIFICATION     left, football trauma-chest tube  . STOMACH SURGERY    . TONSILLECTOMY    . TONSILLECTOMY    . UPPER GASTROINTESTINAL ENDOSCOPY  Family History  Problem Relation Age of Onset  . Diabetes Mother   . Heart failure Mother   . Hypertension Mother   . Heart disease Father   . Hypertension Brother   . Hypertension Brother   . Colon cancer Neg Hx   . Esophageal cancer Neg Hx   . Rectal cancer Neg Hx   . Stomach cancer Neg Hx     Social History   Tobacco Use  . Smoking status: Former Smoker    Years: 6.00    Types: Cigarettes    Quit date: 05/16/2009    Years since quitting: 10.6  . Smokeless tobacco: Never Used  . Tobacco comment: smoked 1 pack per week   Vaping Use  . Vaping Use: Never used  Substance Use Topics  . Alcohol use: Yes    Alcohol/week: 2.0 standard drinks    Types:  2 Cans of beer per week    Comment: occasionally  . Drug use: No    Home Medications Prior to Admission medications   Medication Sig Start Date End Date Taking? Authorizing Provider  acetaminophen (MAPAP) 325 MG tablet Take 650 mg by mouth every 4 (four) hours as needed for mild pain.  02/18/19  Yes [provider]  amLODipine (NORVASC) 2.5 MG tablet Take 2.5 mg by mouth daily.  10/10/19  Yes [provider]  B Complex-C-Folic Acid (DIALYVITE 709 PO) Take 1 tablet by mouth daily.   Yes [provider]  calcitRIOL (ROCALTROL) 0.25 MCG capsule Take 0.5 mcg by mouth daily.  10/10/19  Yes [provider]  cephALEXin (KEFLEX) 250 MG capsule Take 250 mg by mouth in the morning and at bedtime. 01/18/20  Yes [provider]  diphenhydrAMINE HCl, Sleep, (SLEEPINAL MAXIMUM STRENGTH PO) Take 1 tablet by mouth at bedtime.   Yes [provider]  Elderberry 575 MG/5ML SYRP Take 3,450 mg by mouth every 4 (four) hours.    Yes [provider]  ferric citrate (AURYXIA) 1 GM 210 MG(Fe) tablet Take 840 mg by mouth 3 (three) times daily with meals.  02/28/18  Yes [provider]  gentamicin cream (GARAMYCIN) 0.1 % Apply 1 application topically as directed. Port site 07/19/19  Yes [provider]  triamcinolone cream (KENALOG) 0.1 % Apply 1 application topically daily as needed (rash).  10/10/19  Yes [provider]    Allergies    Lisinopril and Losartan  Review of Systems   Review of Systems  Constitutional: Positive for fever.  Genitourinary: Positive for decreased urine volume.  Neurological: Positive for weakness.  All other systems reviewed and are negative.   Physical Exam Updated Vital Signs BP 123/74 (BP Location: Right Arm)   Pulse 93   Temp 98.9 F (37.2 C) (Oral)   Resp 16   Ht 5\' 7"  (1.702 m)   Wt 124.3 kg   SpO2 97%   BMI 42.91 kg/m   Physical Exam Vitals and nursing note reviewed.  Constitutional:       General: He is not in acute distress.    Appearance: He is well-developed.     Comments: Appears nontoxic  HENT:     Head: Normocephalic and atraumatic.  Eyes:     Conjunctiva/sclera: Conjunctivae normal.     Pupils: Pupils are equal, round, and reactive to light.  Cardiovascular:     Rate and Rhythm: Normal rate and regular rhythm.     Pulses: Normal pulses.  Pulmonary:     Effort: Pulmonary effort is  normal. No respiratory distress.     Breath sounds: Normal breath sounds. No wheezing.  Abdominal:     General: There is no distension.     Palpations: Abdomen is soft. There is no mass.     Tenderness: There is no abdominal tenderness. There is no guarding or rebound.     Comments: No TTP of the abdomen.  No rigidity, guarding, distention.  Negative rebound.  No peritonitis.  PD cath without surrounding erythema, tenderness, or induration.  Musculoskeletal:        General: Normal range of motion.     Cervical back: Normal range of motion and neck supple.     Right lower leg: No edema.     Left lower leg: No edema.  Skin:    General: Skin is warm and dry.     Capillary Refill: Capillary refill takes less than 2 seconds.  Neurological:     Mental Status: He is alert and oriented to person, place, and time.     ED Results / Procedures / Treatments   Labs (all labs ordered are listed, but only abnormal results are displayed) Labs Reviewed  COMPREHENSIVE METABOLIC PANEL - Abnormal; Notable for the following components:      Result Value   Sodium 128 (*)    Potassium 3.0 (*)    Chloride 89 (*)    CO2 18 (*)    Glucose, Bld 141 (*)    BUN 78 (*)    Creatinine, Ser 16.98 (*)    Calcium 7.7 (*)    Albumin 2.4 (*)    GFR calc non Af Amer 3 (*)    GFR calc Af Amer 3 (*)    Anion gap 21 (*)    All other components within normal limits  CBC WITH DIFFERENTIAL/PLATELET - Abnormal; Notable for the following components:   WBC 18.1 (*)    RBC 3.51 (*)    Hemoglobin 9.9 (*)      HCT 30.9 (*)    RDW 17.8 (*)    Platelets 44 (*)    Neutro Abs 13.4 (*)    Monocytes Absolute 2.3 (*)    Abs Immature Granulocytes 0.64 (*)    All other components within normal limits  CULTURE, BLOOD (ROUTINE X 2)  CULTURE, BLOOD (ROUTINE X 2)  SARS CORONAVIRUS 2 BY RT PCR (HOSPITAL ORDER, [facility])  URINE CULTURE  LACTIC ACID, PLASMA  LACTIC ACID, PLASMA  URINALYSIS, ROUTINE W REFLEX MICROSCOPIC  HIV ANTIBODY (ROUTINE TESTING W REFLEX)  MAGNESIUM  URINALYSIS, ROUTINE W REFLEX MICROSCOPIC    EKG EKG Interpretation  Date/Time:  Monday January 20 2020 09:42:26 EDT Ventricular Rate:  103 PR Interval:  160 QRS Duration: 100 QT Interval:  362 QTC Calculation: 474 R Axis:   1 Text Interpretation: new Sinus tachycardia with frequent Premature ventricular complexes Cannot rule out Anterior infarct , age undetermined Confirmed by Blanchie Dessert [phone]) on 01/20/2020 1:17:31 PM   Radiology No results found.  Procedures Procedures (including critical care time)  Medications Ordered in ED Medications  vancomycin (VANCOREADY) IVPB 2000 mg/400 mL (2,000 mg Intravenous New Bag/Given 01/20/20 1312)  acetaminophen (TYLENOL) tablet 650 mg (has no administration in time range)    Or  acetaminophen (TYLENOL) suppository 650 mg (has no administration in time range)  ondansetron (ZOFRAN) tablet 4 mg (has no administration in time range)    Or  ondansetron (ZOFRAN) injection 4 mg (has no administration in time range)  potassium chloride (  KLOR-CON) packet 40 mEq (has no administration in time range)    ED Course  I have reviewed the triage vital signs and the nursing notes.  Pertinent labs & imaging results that were available during my care of the patient were reviewed by me and considered in my medical decision making (see chart for details).    MDM Rules/Calculators/A&P                          Patient presenting for treatment for bacteremia.   He has been started on antibiotics, reports improving symptoms.  No tenderness palpation the abdomen, doubt SBP.  Consider urinary source as patient has decreased urination.  Also consider source from PD cath.  Discussed with pharmacy, as patient was found to have gram-positive findings, will treat with just vancomycin at this time.  Labs obtained from triage show leukocytosis of 18.  Patient with an elevated BUN at 78, mild acidosis with a CO2 of 18 and gap of 21.  Last PD was last night.  Additionally, patient thrombocytopenic with platelets of 44.  He will need to be admitted by medicine, will also consult with nephrology.  Discussed with Dr. Lorayne Marek hospitalist service, patient to be admitted.  Discussed with Dr. Jonnie Finner from nephrology, they will consult with patient is admitted.  Final Clinical Impression(s) / ED Diagnoses Final diagnoses:  Bacteremia  ESRD on peritoneal dialysis ([place])  Thrombocytopenia ([place])  Decreased urine output    Rx / DC Orders ED Discharge Orders    None       Franchot Heidelberg, PA-C 01/20/20 1423    Blanchie Dessert, MD 01/20/20 1549

## 2020-01-20 NOTE — Progress Notes (Signed)
Pharmacy Antibiotic Note  Adrian Frye is a 49 y.o. male admitted on 01/20/2020 with bacteremia.  Pharmacy has been consulted for vancomycin dosing. He presented to outpatient PD clinic on 9/3 reporting he was feeling poorly. Blood cultures were taken and patient was started on cephalexin. On 9/5, blood cultures reported GPC's. Patient was instructed to come to ED for IV abx and infectious workup.   Plan:  Vancomycin 2,000 mg IV x 1 Will hold off further doses pending HD/PD plans with renal Follow cultures, renal plans, and infectious workup  Height: 5\' 7"  (170.2 cm) Weight: 124.3 kg (274 lb) IBW/kg (Calculated) : 66.1  Temp (24hrs), Avg:98.9 F (37.2 C), Min:98.9 F (37.2 C), Max:98.9 F (37.2 C)  Recent Labs  Lab 01/20/20 0956  WBC 18.1*  CREATININE 16.98*  LATICACIDVEN 1.3    Estimated Creatinine Clearance: 6.7 mL/min (A) (by C-G formula based on SCr of 16.98 mg/dL (H)).    Allergies  Allergen Reactions  . Lisinopril Swelling    Angioedema   . Losartan Swelling    Antimicrobials this admission: Vancomycin 9/6 >>  Dose adjustments this admission: N/A  Microbiology results: 9/6 BCx: sent  Thank you for allowing pharmacy to be a part of this patient's care.  Kerby Nora, PharmD, BCPS Phone [phone] 01/20/2020       12:36 PM  Please check [email or web address] for unit-specific pharmacist phone numbers

## 2020-01-20 NOTE — Progress Notes (Signed)
NEW ADMISSION NOTE New Admission Note:   Arrival Method:  Patient arrived from ED Mental Orientation:  Alert and oriented x 4. Telemetry: N/A Assessment: Completed Skin: Intact and warm. IV: Right Hand SL Pain: Denies any pain. Tubes: N/A Safety Measures: Safety Fall Prevention Plan has been given, discussed and signed Admission: in process 5 [place] Orientation: Patient has been orientated to the room, unit and staff.  Family: Wife at bedside present.  Orders have been reviewed and implemented. Will continue to monitor the patient. Call light has been placed within reach and bed alarm has been activated.   Amaryllis Dyke, RN

## 2020-01-20 NOTE — H&P (Signed)
History and Physical    Adrian Frye XHB:[id number] DOB: 1971-01-30 DOA: 01/20/2020  PCP: Girtha Rm, NP-C  Patient coming from: home I have personally briefly reviewed patient's old medical records in [place]  Chief Complaint: Iam not feeling wee since several days.   HPI: Adrian Frye is a 49 y.o. male with medical history significant of ESRD on peritoneal dialysis, hypertension, morbid obesity, former tobacco abuse, anemia of chronic disease, thrombocytopenia presents to emergency department for not feeling well and positive blood culture.  Patient tells me that he has not been feeling well since 5 days.  Had fever and chills and nausea.  His highest temperature reported was 102.3 on Thursday.  He was seen by RN at outpatient PD clinic on 9/3.  Blood culture was taken and was started on cephalexin 250 mg twice daily.  His blood culture came back positive for GPC's therefore patient was informed and recommended to go to ER for further evaluation and management. (No BC report in epic-reviewed nephrology note from 9/5)  He has peritoneal dialysis catheter as well as tunneled dialysis catheter.  He denies redness, swelling or pain around the catheter site.  Reports decreased urinary output since 2 days.  No history of foul-smelling urine, dysuria, hematuria, back pain, lower abdominal pain, vomiting.  No history of headache, blurry vision, chest pain, shortness of breath, palpitation, leg swelling, abdominal pain, bowel or sleep changes.  He is a former smoker.  Denies alcohol, listed drug use.  He lives with his wife at home.  He is fully vaccinated against COVID-19.  ED Course: Upon arrival to ED: Patient's vital signs stable.  Lactic acid: WNL, CBC shows leukocytosis of 18.1, H&H is stable, platelet: 44.  CMP shows sodium of 128, potassium of 3.0, anion gap of 21, BUN and creatinine elevated.  Blood culture and COVID-19 pending.  Patient started on IV vancomycin.   Nephrology consulted.  [facility] hospitalist consulted for admission for bacteremia due to gram-positive cocci.  Review of Systems: As per HPI otherwise negative.    Past Medical History:  Diagnosis Date  . Allergic rhinitis   . Allergy   . Anemia   . Asthma    as a child  . Benign essential hypertension   . Chronic kidney disease    stage III-IV  . Dilated idiopathic cardiomyopathy ([place])    now resoved with EF 55% by echo 2011  . Edema   . Encounter for blood transfusion   . Heart disease   . Hyperlipidemia   . Hyperparathyroidism, secondary ([place])   . Hypertension   . Morbid obesity ([place])   . Sleep apnea    c-pap  . UGI bleed 2011   ASA    Past Surgical History:  Procedure Laterality Date  . ACHILLES TENDON REPAIR     ruptured; right  . AV FISTULA PLACEMENT Left 11/03/2017   Procedure: ARTERIOVENOUS (AV) FISTULA CREATION  left upper arm;  Surgeon: Rosetta Posner, MD;  Location: [facility];  Service: Vascular;  Laterality: Left;  . DIALYSIS FISTULA CREATION    . INSERTION OF DIALYSIS CATHETER N/A 11/03/2017   Procedure: INSERTION OF DIALYSIS CATHETER - RIGHT INTERNAL JUGULAR PLACEMENT;  Surgeon: Rosetta Posner, MD;  Location: [facility] OR;  Service: Vascular;  Laterality: N/A;  . KIDNEY FAILURE    . LAPAROSCOPIC GASTROTOMY W/ REPAIR OF ULCER    . PLEURAL SCARIFICATION     left, football trauma-chest tube  . STOMACH SURGERY    .  TONSILLECTOMY    . TONSILLECTOMY    . UPPER GASTROINTESTINAL ENDOSCOPY       reports that he quit smoking about 10 years ago. His smoking use included cigarettes. He quit after 6.00 years of use. He has never used smokeless tobacco. He reports current alcohol use of about 2.0 standard drinks of alcohol per week. He reports that he does not use drugs.  Allergies  Allergen Reactions  . Lisinopril Swelling    Angioedema   . Losartan Swelling    Family History  Problem Relation Age of Onset  . Diabetes Mother   . Heart failure Mother   . Hypertension  Mother   . Heart disease Father   . Hypertension Brother   . Hypertension Brother   . Colon cancer Neg Hx   . Esophageal cancer Neg Hx   . Rectal cancer Neg Hx   . Stomach cancer Neg Hx     Prior to Admission medications   Medication Sig Start Date End Date Taking? Authorizing Provider  acetaminophen (MAPAP) 325 MG tablet Take by mouth. 02/18/19   [provider]  amLODipine (NORVASC) 2.5 MG tablet  10/10/19   [provider]  calcitRIOL (ROCALTROL) 0.25 MCG capsule Take 0.25 mcg by mouth daily.  10/10/19   [provider]  ferric citrate (AURYXIA) 1 GM 210 MG(Fe) tablet Take by mouth. 02/28/18   [provider]  gentamicin cream (GARAMYCIN) 0.1 % APPLY TO EXIT SITE DAILY AS DIRECTED 07/19/19   [provider]  heparin sodium, porcine, 1000 UNIT/ML injection Heparin Sodium (Porcine) 1,000 Units/mL Catheter Lock Venous 02/06/19 02/05/20  [provider]  triamcinolone cream (KENALOG) 0.1 % SMARTSIG:1 Application Topical 2-3 Times Daily 10/10/19   [provider]  Vitamins/Minerals TABS Take by mouth.    [provider]    Physical Exam: Vitals:   01/20/20 0932 01/20/20 0944 01/20/20 1156  BP: (!) 145/76  123/74  Pulse: (!) 105  93  Resp: 16  16  Temp: 98.9 F (37.2 C)    TempSrc: Oral    SpO2: 97%  97%  Weight:  124.3 kg   Height:  5\' 7"  (1.702 m)     Constitutional: NAD, calm, comfortable, on room air, morbidly obese, communicating well  eyes: PERRL, lids and conjunctivae normal ENMT: Mucous membranes are moist. Posterior pharynx clear of any exudate or lesions.Normal dentition.  Neck: normal, supple, no masses, no thyromegaly Respiratory: clear to auscultation bilaterally, no wheezing, no crackles. Normal respiratory effort. No accessory muscle use.  Cardiovascular: Regular rate and rhythm, no murmurs / rubs / gallops. No extremity edema. 2+ pedal pulses. No carotid bruits.  Abdomen: no tenderness, no masses  palpated. No hepatosplenomegaly. Bowel sounds positive.  Musculoskeletal: no clubbing / cyanosis. No joint deformity upper and lower extremities. Good ROM, no contractures. Normal muscle tone.  Skin: no rashes, lesions, ulcers. No induration, swelling noted around catheter site  neurologic: CN 2-12 grossly intact. Sensation intact, DTR normal. Strength 5/5 in all 4.  Psychiatric: Normal judgment and insight. Alert and oriented x 3. Normal mood.    Labs on Admission: I have personally reviewed following labs and imaging studies  CBC: Recent Labs  Lab 01/20/20 0956  WBC 18.1*  NEUTROABS 13.4*  HGB 9.9*  HCT 30.9*  MCV 88.0  PLT 44*   Basic Metabolic Panel: Recent Labs  Lab 01/20/20 0956  NA 128*  K 3.0*  CL 89*  CO2 18*  GLUCOSE 141*  BUN 78*  CREATININE  16.98*  CALCIUM 7.7*   GFR: Estimated Creatinine Clearance: 6.7 mL/min (A) (by C-G formula based on SCr of 16.98 mg/dL (H)). Liver Function Tests: Recent Labs  Lab 01/20/20 0956  AST 28  ALT 44  ALKPHOS 99  BILITOT 0.5  PROT 6.7  ALBUMIN 2.4*   No results for input(s): LIPASE, AMYLASE in the last 168 hours. No results for input(s): AMMONIA in the last 168 hours. Coagulation Profile: No results for input(s): INR, PROTIME in the last 168 hours. Cardiac Enzymes: No results for input(s): CKTOTAL, CKMB, CKMBINDEX, TROPONINI in the last 168 hours. BNP (last 3 results) No results for input(s): PROBNP in the last 8760 hours. HbA1C: No results for input(s): HGBA1C in the last 72 hours. CBG: No results for input(s): GLUCAP in the last 168 hours. Lipid Profile: No results for input(s): CHOL, HDL, LDLCALC, TRIG, CHOLHDL, LDLDIRECT in the last 72 hours. Thyroid Function Tests: No results for input(s): TSH, T4TOTAL, FREET4, T3FREE, THYROIDAB in the last 72 hours. Anemia Panel: No results for input(s): VITAMINB12, FOLATE, FERRITIN, TIBC, IRON, RETICCTPCT in the last 72 hours. Urine analysis:    Component Value  Date/Time   COLORURINE STRAW (A) 11/02/2017 2208   APPEARANCEUR CLEAR 11/02/2017 2208   LABSPEC 1.011 11/02/2017 2208   PHURINE 5.0 11/02/2017 2208   GLUCOSEU 50 (A) 11/02/2017 2208   HGBUR NEGATIVE 11/02/2017 2208   BILIRUBINUR NEGATIVE 11/02/2017 2208   KETONESUR 5 (A) 11/02/2017 2208   PROTEINUR 100 (A) 11/02/2017 2208   UROBILINOGEN 0.2 09/15/2009 2240   NITRITE NEGATIVE 11/02/2017 2208   LEUKOCYTESUR NEGATIVE 11/02/2017 2208    Radiological Exams on Admission: No results found.  EKG: Independently reviewed.  Sinus tachycardia with premature ventricular complexes.   Assessment/Plan Principal Problem:   Bacteremia due to Gram-positive bacteria Active Problems:   Morbid obesity ([place])   Hypertension   ESRD on peritoneal dialysis ([place])   Hypokalemia   Hyponatremia   Thrombocytopenia ([facility])   Anemia of chronic disease   Bacteremia    Bacteremia due to gram-positive cocci:  -Patient had fever of 102.3, blood culture was drawn on 9/3 which came back positive for gram-positive cocci-source is likely tunneled dialysis catheter.  Currently patient is afebrile, has leukocytosis of 18.1, lactic acid: WNL, vital signs stable. -Received vancomycin in ED.  Repeat blood culture is pending.  COVID-19 is pending. -Admit patient on the floor.  Continue IV vancomycin.  Will get transthoracic echo -Nephrology has been consulted by EDP -He likely needs [facility] removal (Pt wife's says that patient has to take the decision about the catheter removal!!) -Monitor vital signs closely.  ESRD on peritoneal dialysis: -Patient appears euvolemic. -Nephrology consulted  Decreased urinary output: -We will check UA.  Will get bladder ultrasound to check for urinary retention.  Hypertension: Stable -We will continue amlodipine.  Monitor blood pressure closely  Anemia of chronic disease: H&H is stable -Continue to monitor  Thrombocytopenia: Platelet: 44 -No signs of active bleeding.  Continue to  monitor.  Hold prophylactic anticoagulation.  Hypokalemia: Potassium of 3.0 -Replenished.  Check magnesium level.  Repeat BMP tomorrow a.m.  Hyponatremia: Sodium of 128-patient is asymptomatic -Repeat BMP tomorrow a.m.  Anion gap metabolic acidosis: Likely in the setting of ESRD.  Anion gap 21.  Morbid obesity with BMI of 42 -Diet modification/exercise and weight loss recommended.  DVT prophylaxis: SCD-no chemical anticoagulation due to thrombocytopenia Code Status: Full code Family Communication: Patient's wife present at bedside.  Plan of care discussed with patient in length and he  verbalized understanding and agreed with it. Disposition Plan: Home in 2 to 3 days  consults called: Nephrology by EDP Admission status: Inpatient   Mckinley Jewel MD [facility]  If 7PM-7AM, please contact night-coverage [email or web address] Password [facility]  01/20/2020, 12:58 PM

## 2020-01-20 NOTE — ED Triage Notes (Signed)
Pt here sent by nephrology for IV abx d/t a culture that showed bacteria in his dialysis catheter when they did blood work for pt's complaint of lethargy, hypotension, and fever x 2 weeks.

## 2020-01-20 NOTE — Consult Note (Signed)
[facility] Renal Consultation Note    Indication for Consultation:  Management of ESRD/hemodialysis, anemia, hypertension/volume, and secondary hyperparathyroidism.  HPI: Adrian Frye is a 48 y.o. male with PMH including ESRD on PD, HTN, heart disease, sleep apnea, and UGI bleed who presented to the ED on 01/20/20 with fever, chills and nausea x 5 days. Patient reports he had a fever of 102.3 on Thursday and noted purulent drainage on his TDC bandage. He informed his dialysis RN and blood cultures were ordered. On 9/4, he reported hypotension and ongoing fever and was started on keflex. On 01/19/20, Dr. Adin Hector note states: "Today notified that patient had positive blood culture, GPC's. Patient informed to present the ER, has tunnel dialysis catheter in addition to PD catheter.   Wil l likely require TTE, TDC removal, IV ABX." Patient reports last PD was last night.   In the ED, VSS, Na 128, K+ 3.0, BUN 78, Cr 16.98, Ca 7.7, Alb 2.5, WBC 18.1, Hgb 9.9, plt 44.  Pt was started on vancomycin, transthoracic echo ordered, and given potassium 42mEq x1 dose.   Patient endorses fever, chills, nausea and fatigue. Denies any abdominal pain, cloudy or blood PD fluid. No SOB, cough, CP, palpitations or dizziness. He has been on peritoneal dialysis since 07/2019 and was on HD prior to this. Patient reports he still has his dialysis catheter because he is a very difficult stick and uses it for labs. Per notes, has also been left in place due to kt/v below goal in case patient needs to return to [facility]. He is very hesitant to have his TDC removed.  Past Medical History:  Diagnosis Date  . Allergic rhinitis   . Allergy   . Anemia   . Asthma    as a child  . Benign essential hypertension   . Chronic kidney disease    stage III-IV  . Dilated idiopathic cardiomyopathy ([place])    now resoved with EF 55% by echo 2011  . Edema   . Encounter for blood transfusion   . Heart disease   .  Hyperlipidemia   . Hyperparathyroidism, secondary ([place])   . Hypertension   . Morbid obesity ([place])   . Sleep apnea    c-pap  . UGI bleed 2011   ASA   Past Surgical History:  Procedure Laterality Date  . ACHILLES TENDON REPAIR     ruptured; right  . AV FISTULA PLACEMENT Left 11/03/2017   Procedure: ARTERIOVENOUS (AV) FISTULA CREATION  left upper arm;  Surgeon: Rosetta Posner, MD;  Location: [facility];  Service: Vascular;  Laterality: Left;  . DIALYSIS FISTULA CREATION    . INSERTION OF DIALYSIS CATHETER N/A 11/03/2017   Procedure: INSERTION OF DIALYSIS CATHETER - RIGHT INTERNAL JUGULAR PLACEMENT;  Surgeon: Rosetta Posner, MD;  Location: [facility] OR;  Service: Vascular;  Laterality: N/A;  . KIDNEY FAILURE    . LAPAROSCOPIC GASTROTOMY W/ REPAIR OF ULCER    . PLEURAL SCARIFICATION     left, football trauma-chest tube  . STOMACH SURGERY    . TONSILLECTOMY    . TONSILLECTOMY    . UPPER GASTROINTESTINAL ENDOSCOPY     Family History  Problem Relation Age of Onset  . Diabetes Mother   . Heart failure Mother   . Hypertension Mother   . Heart disease Father   . Hypertension Brother   . Hypertension Brother   . Colon cancer Neg Hx   . Esophageal cancer Neg Hx   . Rectal  cancer Neg Hx   . Stomach cancer Neg Hx    Social History:  reports that he quit smoking about 10 years ago. His smoking use included cigarettes. He quit after 6.00 years of use. He has never used smokeless tobacco. He reports current alcohol use of about 2.0 standard drinks of alcohol per week. He reports that he does not use drugs..  Review of Systems: as per HPI  Physical Exam: Vitals:   01/20/20 1156 01/20/20 1245 01/20/20 1345 01/20/20 1430  BP: 123/74 112/61    Pulse: 93 92 92 87  Resp: 16 (!) 28 18 20   Temp:      TempSrc:      SpO2: 97% 98% 95% 98%  Weight:      Height:         General: Well developed, well nourished, in no acute distress. Head: Normocephalic, atraumatic, sclera non-icteric, mucus membranes are  moist. Neck: Supple without lymphadenopathy/masses. JVD not elevated. Lungs: Clear bilaterally to auscultation without wheezes, rales, or rhonchi. Breathing is unlabored. Heart: RRR with normal S1, S2. No murmurs, rubs, or gallops appreciated. Abdomen: Soft, non-tender, non-distended with normoactive bowel sounds. No rebound/guarding. No obvious abdominal masses. PD catheter in place right lower abdomen, no surrounding erythema/drainage Musculoskeletal:  Strength and tone appear normal for age. Lower extremities: No edema or ischemic changes, no open wounds. Neuro: Alert and oriented X 3. Moves all extremities spontaneously. Psych:  Responds to questions appropriately with a normal affect. Dialysis Access: PD catheter R lower abdomen, TDC L chest covered with clean bandage, no erythema, drainage or bleeding noted.   Allergies  Allergen Reactions  . Lisinopril Swelling and Anaphylaxis    Angioedema  Other reaction(s): Angioedema (ALLERGY/intolerance)  . Losartan Swelling and Anaphylaxis   Prior to Admission medications   Medication Sig Start Date End Date Taking? Authorizing Provider  acetaminophen (MAPAP) 325 MG tablet Take 650 mg by mouth every 4 (four) hours as needed for mild pain.  02/18/19  Yes [provider]  amLODipine (NORVASC) 2.5 MG tablet Take 2.5 mg by mouth daily.  10/10/19  Yes [provider]  B Complex-C-Folic Acid (DIALYVITE 433 PO) Take 1 tablet by mouth daily.   Yes [provider]  calcitRIOL (ROCALTROL) 0.25 MCG capsule Take 0.5 mcg by mouth daily.  10/10/19  Yes [provider]  cephALEXin (KEFLEX) 250 MG capsule Take 250 mg by mouth in the morning and at bedtime. 01/18/20  Yes [provider]  diphenhydrAMINE HCl, Sleep, (SLEEPINAL MAXIMUM STRENGTH PO) Take 1 tablet by mouth at bedtime.   Yes [provider]  Elderberry 575 MG/5ML SYRP Take 3,450 mg by mouth every 4 (four) hours.    Yes [provider]   ferric citrate (AURYXIA) 1 GM 210 MG(Fe) tablet Take 840 mg by mouth 3 (three) times daily with meals.  02/28/18  Yes [provider]  gentamicin cream (GARAMYCIN) 0.1 % Apply 1 application topically as directed. Port site 07/19/19  Yes [provider]  triamcinolone cream (KENALOG) 0.1 % Apply 1 application topically daily as needed (rash).  10/10/19  Yes [provider]   Current Facility-Administered Medications  Medication Dose Route Frequency Provider Last Rate Last Admin  . acetaminophen (TYLENOL) tablet 650 mg  650 mg Oral Q6H PRN Pahwani, Rinka R, MD       Or  . acetaminophen (TYLENOL) suppository 650 mg  650 mg Rectal Q6H PRN Pahwani, Rinka R, MD      . gentamicin cream (GARAMYCIN)  0.1 % 1 application  1 application Topical Daily Collins, Samantha G, PA-C      . heparin 1000 unit/ml injection 500 Units  500 Units Intraperitoneal PRN Collins, Samantha G, PA-C      . ondansetron (ZOFRAN) tablet 4 mg  4 mg Oral Q6H PRN Pahwani, Rinka R, MD       Or  . ondansetron (ZOFRAN) injection 4 mg  4 mg Intravenous Q6H PRN Pahwani, Rinka R, MD      . perflutren lipid microspheres (DEFINITY) IV suspension  1-10 mL Intravenous PRN Pahwani, Rinka R, MD   2 mL at 01/20/20 1533  . vancomycin variable dose per unstable renal function (pharmacist dosing)   Does not apply See admin instructions Pahwani, Michell Heinrich, MD       Current Outpatient Medications  Medication Sig Dispense Refill  . acetaminophen (MAPAP) 325 MG tablet Take 650 mg by mouth every 4 (four) hours as needed for mild pain.     Marland Kitchen amLODipine (NORVASC) 2.5 MG tablet Take 2.5 mg by mouth daily.     . B Complex-C-Folic Acid (DIALYVITE 790 PO) Take 1 tablet by mouth daily.    . calcitRIOL (ROCALTROL) 0.25 MCG capsule Take 0.5 mcg by mouth daily.     . cephALEXin (KEFLEX) 250 MG capsule Take 250 mg by mouth in the morning and at bedtime.    . diphenhydrAMINE HCl, Sleep, (SLEEPINAL MAXIMUM STRENGTH PO) Take 1 tablet by  mouth at bedtime.    Kendall Flack 575 MG/5ML SYRP Take 3,450 mg by mouth every 4 (four) hours.     . ferric citrate (AURYXIA) 1 GM 210 MG(Fe) tablet Take 840 mg by mouth 3 (three) times daily with meals.     Marland Kitchen gentamicin cream (GARAMYCIN) 0.1 % Apply 1 application topically as directed. Port site    . triamcinolone cream (KENALOG) 0.1 % Apply 1 application topically daily as needed (rash).      Labs: Basic Metabolic Panel: Recent Labs  Lab 01/20/20 0956  NA 128*  K 3.0*  CL 89*  CO2 18*  GLUCOSE 141*  BUN 78*  CREATININE 16.98*  CALCIUM 7.7*   Liver Function Tests: Recent Labs  Lab 01/20/20 0956  AST 28  ALT 44  ALKPHOS 99  BILITOT 0.5  PROT 6.7  ALBUMIN 2.4*   CBC: Recent Labs  Lab 01/20/20 0956  WBC 18.1*  NEUTROABS 13.4*  HGB 9.9*  HCT 30.9*  MCV 88.0  PLT 44*   Studies/Results: ECHOCARDIOGRAM COMPLETE  Result Date: 01/20/2020    ECHOCARDIOGRAM REPORT   Patient Name:   Adrian Frye Date of Exam: 01/20/2020 Medical Rec #:  [id number]             Height:       67.0 in Accession #:    [id number]            Weight:       274.0 lb Date of Birth:  Mar 29, 1971             BSA:          2.312 m Patient Age:    51 years              BP:           112/61 mmHg Patient Gender: M                     HR:           90  bpm. Exam Location:  Inpatient Procedure: 2D Echo and Intracardiac Opacification Agent Indications:    Bacteremia  History:        Patient has prior history of Echocardiogram examinations, most                 recent 06/19/2013. Risk Factors:Hypertension and Dyslipidemia.  Sonographer:    Mikki Santee RDCS (AE) Referring Phys: [id number] Michell Heinrich PAHWANI IMPRESSIONS  1. Abnormal septal motion . Left ventricular ejection fraction, by estimation, is 50 to 55%. The left ventricle has low normal function. The left ventricle has no regional wall motion abnormalities. There is mild left ventricular hypertrophy. Left ventricular diastolic parameters were normal.  2. Right  ventricular systolic function is normal. The right ventricular size is normal.  3. The mitral valve is normal in structure. No evidence of mitral valve regurgitation. No evidence of mitral stenosis.  4. The aortic valve is normal in structure. Aortic valve regurgitation is not visualized. No aortic stenosis is present.  5. The inferior vena cava is normal in size with greater than 50% respiratory variability, suggesting right atrial pressure of 3 mmHg. FINDINGS  Left Ventricle: Abnormal septal motion. Left ventricular ejection fraction, by estimation, is 50 to 55%. The left ventricle has low normal function. The left ventricle has no regional wall motion abnormalities. Definity contrast agent was given IV to delineate the left ventricular endocardial borders. The left ventricular internal cavity size was normal in size. There is mild left ventricular hypertrophy. Left ventricular diastolic parameters were normal. Right Ventricle: The right ventricular size is normal. No increase in right ventricular wall thickness. Right ventricular systolic function is normal. Left Atrium: Left atrial size was normal in size. Right Atrium: Right atrial size was normal in size. Pericardium: There is no evidence of pericardial effusion. Mitral Valve: The mitral valve is normal in structure. Normal mobility of the mitral valve leaflets. No evidence of mitral valve regurgitation. No evidence of mitral valve stenosis. Tricuspid Valve: The tricuspid valve is normal in structure. Tricuspid valve regurgitation is trivial. No evidence of tricuspid stenosis. Aortic Valve: The aortic valve is normal in structure. Aortic valve regurgitation is not visualized. No aortic stenosis is present. Pulmonic Valve: The pulmonic valve was not well visualized. Pulmonic valve regurgitation is not visualized. No evidence of pulmonic stenosis. Aorta: The aortic root is normal in size and structure. Venous: The inferior vena cava is normal in size with  greater than 50% respiratory variability, suggesting right atrial pressure of 3 mmHg. IAS/Shunts: No atrial level shunt detected by color flow Doppler.  LEFT VENTRICLE PLAX 2D LVIDd:         4.40 cm  Diastology LVIDs:         3.00 cm  LV e' lateral:   11.50 cm/s LV PW:         1.30 cm  LV E/e' lateral: 6.5 LV IVS:        1.30 cm  LV e' medial:    10.20 cm/s LVOT diam:     2.50 cm  LV E/e' medial:  7.3 LV SV:         86 LV SV Index:   37 LVOT Area:     4.91 cm  RIGHT VENTRICLE RV S prime:     13.30 cm/s TAPSE (M-mode): 2.1 cm LEFT ATRIUM             Index       RIGHT ATRIUM  Index LA diam:        3.60 cm 1.56 cm/m  RA Area:     17.00 cm LA Vol (A2C):   41.1 ml 17.78 ml/m RA Volume:   46.00 ml  19.90 ml/m LA Vol (A4C):   34.6 ml 14.97 ml/m LA Biplane Vol: 38.2 ml 16.52 ml/m  AORTIC VALVE LVOT Vmax:   94.50 cm/s LVOT Vmean:  64.400 cm/s LVOT VTI:    0.176 m  AORTA Ao Root diam: 3.70 cm MITRAL VALVE MV Area (PHT): 4.86 cm    SHUNTS MV Decel Time: 156 msec    Systemic VTI:  0.18 m MV E velocity: 74.90 cm/s  Systemic Diam: 2.50 cm MV A velocity: 67.30 cm/s MV E/A ratio:  1.11 Jenkins Rouge MD Electronically signed by Jenkins Rouge MD Signature Date/Time: 01/20/2020/3:47:50 PM    Final     Dialysis Orders:  [facility] 7x week 6 exchanges, all 1.5% dextrose, fill volume 3000 ml Day dwell 1.5% dextrose, 2000 mL EDW 128kg  01/17/20 labs Hgb 10.8, tsat 25%, WBC 13.07, Plt 82, Na 132, K+ 3.5, Bicarbonate 20, Alb 3.7, Ca 8.0, Phos 6.4  Assessment/Plan: 1.  Bacteremia: Outpatient blood cultures growing staph. Patient started on IV vancomycin. Suspect TDC is likely source of infection given recent drainage from exit site. Patient is hesitant to have [place] removed as he uses it for blood draws. Discussed that TDC's are very high risk for infection, discussed possibility of line holiday. Will leave TDC in place for now, follow echo results, continue vancomycin and follow. No symptoms of peritonitis but will get PD  cell count and culture to rule out.  2.  ESRD:  On PD, continue 6 exchanges overnight (3L) and daytime dwell (2L) with all 1.5% dextrose.  3. Hypokalemia: Given 74mEq KCl today, repeat BMP in AM.  4.  Hypertension/volume: BP controlled, does not appear volume overloaded. Continue all 1.5% exchanges. Hyponatremia noted with Na 128- if no improvement in AM may need to add some 2.5% exchanges. Continue home meds (amlodipine).  5.  Anemia: Hgb 9.9. If Hgb remains below 10 on next labs, will start ESA. 6.  Metabolic bone disease: Corrected calcium 9.0. Continue calcitriol 0.9mcg daily and auryxia 4 tabs TID with meals.  7.  Nutrition:  Albumin low.  8. Thrombocytopenia: Plt 44, anticoagulation on hold. Per primary.   Anice Paganini, PA-C 01/20/2020, 3:50 PM  [facility] Pager: [phone]  Pt seen, examined and agree w A/P as above.  Kelly Splinter  MD 01/20/2020, 4:36 PM

## 2020-01-20 NOTE — ED Notes (Signed)
Unable to get second set of blood cultures from IV started , pt unwillingly to allow me to stick him again at this time.

## 2020-01-21 ENCOUNTER — Inpatient Hospital Stay (HOSPITAL_COMMUNITY): Payer: Federal, State, Local not specified - PPO

## 2020-01-21 DIAGNOSIS — D696 Thrombocytopenia, unspecified: Secondary | ICD-10-CM

## 2020-01-21 DIAGNOSIS — D638 Anemia in other chronic diseases classified elsewhere: Secondary | ICD-10-CM

## 2020-01-21 DIAGNOSIS — Z992 Dependence on renal dialysis: Secondary | ICD-10-CM

## 2020-01-21 DIAGNOSIS — N186 End stage renal disease: Secondary | ICD-10-CM | POA: Diagnosis not present

## 2020-01-21 DIAGNOSIS — N2581 Secondary hyperparathyroidism of renal origin: Secondary | ICD-10-CM | POA: Diagnosis not present

## 2020-01-21 DIAGNOSIS — I10 Essential (primary) hypertension: Secondary | ICD-10-CM

## 2020-01-21 HISTORY — PX: IR REMOVAL TUN CV CATH W/O FL: IMG2289

## 2020-01-21 LAB — COMPREHENSIVE METABOLIC PANEL
ALT: 42 U/L (ref 0–44)
AST: 24 U/L (ref 15–41)
Albumin: 2.5 g/dL — ABNORMAL LOW (ref 3.5–5.0)
Alkaline Phosphatase: 95 U/L (ref 38–126)
Anion gap: 20 — ABNORMAL HIGH (ref 5–15)
BUN: 77 mg/dL — ABNORMAL HIGH (ref 6–20)
CO2: 19 mmol/L — ABNORMAL LOW (ref 22–32)
Calcium: 7.7 mg/dL — ABNORMAL LOW (ref 8.9–10.3)
Chloride: 91 mmol/L — ABNORMAL LOW (ref 98–111)
Creatinine, Ser: 16.52 mg/dL — ABNORMAL HIGH (ref 0.61–1.24)
GFR calc Af Amer: 3 mL/min — ABNORMAL LOW (ref >60)
GFR calc non Af Amer: 3 mL/min — ABNORMAL LOW (ref >60)
Glucose, Bld: 129 mg/dL — ABNORMAL HIGH (ref 70–99)
Potassium: 3.5 mmol/L (ref 3.5–5.1)
Sodium: 130 mmol/L — ABNORMAL LOW (ref 135–145)
Total Bilirubin: 0.5 mg/dL (ref 0.3–1.2)
Total Protein: 6.6 g/dL (ref 6.5–8.1)

## 2020-01-21 LAB — BLOOD CULTURE ID PANEL (REFLEXED) - BCID2

## 2020-01-21 LAB — LACTIC ACID, PLASMA: Lactic Acid, Venous: 1.1 mmol/L (ref 0.5–1.9)

## 2020-01-21 LAB — CBC
HCT: 29.3 % — ABNORMAL LOW (ref 39.0–52.0)
Hemoglobin: 9.5 g/dL — ABNORMAL LOW (ref 13.0–17.0)
MCH: 28.3 pg (ref 26.0–34.0)
MCHC: 32.4 g/dL (ref 30.0–36.0)
MCV: 87.2 fL (ref 80.0–100.0)
Platelets: 52 10*3/uL — ABNORMAL LOW (ref 150–400)
RBC: 3.36 MIL/uL — ABNORMAL LOW (ref 4.22–5.81)
RDW: 17.9 % — ABNORMAL HIGH (ref 11.5–15.5)
WBC: 17.2 10*3/uL — ABNORMAL HIGH (ref 4.0–10.5)
nRBC: 0 % (ref 0.0–0.2)

## 2020-01-21 MED ORDER — DELFLEX-LC/1.5% DEXTROSE 344 MOSM/L IP SOLN: INTRAPERITONEAL | Status: AC

## 2020-01-21 MED ORDER — LIDOCAINE HCL 1 % IJ SOLN
INTRAMUSCULAR | Status: DC | PRN
  Administered 2020-01-21: 10 mL

## 2020-01-21 MED ORDER — CHLORHEXIDINE GLUCONATE 4 % EX LIQD
CUTANEOUS | Status: AC
  Filled 2020-01-21: qty 15

## 2020-01-21 MED ORDER — DELFLEX-LC/1.5% DEXTROSE 344 MOSM/L IP SOLN: INTRAPERITONEAL | Status: DC

## 2020-01-21 MED ORDER — ORITAVANCIN DIPHOSPHATE 400 MG IV SOLR
1200.0000 mg | Freq: Once | INTRAVENOUS | Status: AC
  Administered 2020-01-21: 1200 mg via INTRAVENOUS
  Filled 2020-01-21: qty 120

## 2020-01-21 MED ORDER — AMLODIPINE BESYLATE 5 MG PO TABS
2.5000 mg | ORAL_TABLET | Freq: Every day | ORAL | Status: DC
  Administered 2020-01-21 – 2020-01-24 (×4): 2.5 mg via ORAL
  Filled 2020-01-21 (×4): qty 1

## 2020-01-21 MED ORDER — LIDOCAINE HCL 1 % IJ SOLN
INTRAMUSCULAR | Status: AC
  Filled 2020-01-21: qty 20

## 2020-01-21 MED ORDER — GENTAMICIN SULFATE 0.1 % EX CREA
1.0000 "application " | TOPICAL_CREAM | Freq: Every day | CUTANEOUS | Status: DC
  Administered 2020-01-21: 1 via TOPICAL
  Filled 2020-01-21: qty 15

## 2020-01-21 MED ORDER — SENNOSIDES-DOCUSATE SODIUM 8.6-50 MG PO TABS
1.0000 | ORAL_TABLET | Freq: Two times a day (BID) | ORAL | Status: DC
  Administered 2020-01-21 – 2020-01-23 (×6): 1 via ORAL
  Filled 2020-01-21 (×7): qty 1

## 2020-01-21 MED ORDER — DELFLEX-LC/2.5% DEXTROSE 394 MOSM/L IP SOLN: INTRAPERITONEAL | Status: DC

## 2020-01-21 NOTE — Progress Notes (Signed)
PROGRESS NOTE  Adrian Frye WUJ:[id number] DOB: Jan 21, 1971 DOA: 01/20/2020 PCP: Girtha Rm, NP-C  HPI/Recap of past 24 hours: HPI from Dr Rennis Petty Adrian Frye is a 49 y.o. male with medical history significant of ESRD on PD, HTN, morbid obesity, sleep apnea, TDC for blood draws, presents to ED for not feeling well and positive blood culture. Patient tells me that he has not been feeling well since 5 days.  Had fever and chills and nausea.  His highest temperature reported was 102.3 on Thursday.  He was seen by RN at outpatient PD clinic on 9/3.  Blood culture was taken and was started on cephalexin 250 mg twice daily.  His blood culture came back positive for staph aureus, therefore patient was recommended to go to ER for further evaluation and management. (No BC report in epic-reviewed nephrology note from 9/5). Noted some drainage from [facility]. Pt is a former smoker. Denies alcohol, listed drug use. Pt lives with his wife at home. Pt is fully vaccinated against COVID-19. Upon arrival to ED: Patient's vital signs stable. CBC shows leukocytosis of 18.1, platelet: 44. Patient started on IV vancomycin.  Nephrology consulted.  [facility] hospital[facility] consulted for admission for bacteremia due to gram-positive cocci.     Today, patient denies any new complaints, but states that he was ready to be discharged.  Had an extensive conversation with patient on the need to remove [facility] while here in the hospital and the need for IV antibiotics while we wait for final culture sensitivity.  Patient initially reluctant, but now agreeable to stay.  Patient is worried about getting Covid    Assessment/Plan: Principal Problem:   Bacteremia due to Gram-positive bacteria Active Problems:   Morbid obesity ([place])   Hypertension   ESRD on peritoneal dialysis ([place])   Hypokalemia   Hyponatremia   Thrombocytopenia ([facility])   Anemia of chronic disease   Bacteremia   Sepsis likely 2/2 staph bacteremia in  the setting of [facility] On admission, noted leukocytosis, tachycardia, with LA Currently afebrile with leukocytosis BC in nephrology clinic growing staph aureus, await susceptibility, repeat BC pending Peritoneal fluid culture from PD, NGTD IR consulted for [facility] removal Cardiology consulted for TEE on 01/22/2020 ID consulted, appreciate recs Continue IV vancomycin Monitor closely  ESRD on PD/anion gap metabolic acidosis Nephrology consulted Daily renal panel  Anemia of chronic kidney disease Hemoglobin at baseline Daily CBC  Hypertension BP stable Continue amlodipine  Thrombocytopenia Chronic, although appears to have worsened Likely possibly 2/2 from sepsis  Obstructive sleep apnea Continue CPAP  Morbid obesity Lifestyle modification advised       Malnutrition Type:      Malnutrition Characteristics:      Nutrition Interventions:       Estimated body mass index is 45.2 kg/m as calculated from the following:   Height as of this encounter: 5\' 7"  (1.702 m).   Weight as of this encounter: 130.9 kg.     Code Status: Full  Family Communication: Discussed with patient's  Disposition Plan: Status is: Inpatient  Remains inpatient appropriate because:Inpatient level of care appropriate due to severity of illness   Dispo: The patient is from: Home              Anticipated d/c is to: Home              Anticipated d/c date is: 2 days              Patient currently is not medically  stable to d/c.    Consultants:  Nephrology  ID  Procedures:  None  Antimicrobials:  Vancomycin  DVT prophylaxis: SCDs due to thrombocytopenia   Objective: Vitals:   01/20/20 1818 01/20/20 2048 01/21/20 0531 01/21/20 1115  BP: (!) 142/89 (!) 155/74 (!) 114/55   Pulse: 87 92 95   Resp: 18 (!) 22 (!) 24   Temp: 99.1 F (37.3 C) 99.3 F (37.4 C) 99 F (37.2 C)   TempSrc: Oral Oral Oral   SpO2:  98% 94%   Weight:  131.8 kg  130.9 kg  Height:         Intake/Output Summary (Last 24 hours) at 01/21/2020 1326 Last data filed at 01/21/2020 0900 Gross per 24 hour  Intake 240 ml  Output 0 ml  Net 240 ml   Filed Weights   01/20/20 1615 01/20/20 2048 01/21/20 1115  Weight: 129.4 kg 131.8 kg 130.9 kg    Exam:  General: NAD, TDC in place, no significant tenderness noted  Cardiovascular: S1, S2 present  Respiratory: CTAB  Abdomen: Soft, nontender, nondistended, bowel sounds present  Musculoskeletal: No bilateral pedal edema noted  Skin: Normal  Psychiatry: Normal mood  Data Reviewed: CBC: Recent Labs  Lab 01/20/20 0956 01/21/20 0808  WBC 18.1* 17.2*  NEUTROABS 13.4*  --   HGB 9.9* 9.5*  HCT 30.9* 29.3*  MCV 88.0 87.2  PLT 44* 52*   Basic Metabolic Panel: Recent Labs  Lab 01/20/20 0956 01/20/20 1935 01/21/20 0808  NA 128*  --  130*  K 3.0*  --  3.5  CL 89*  --  91*  CO2 18*  --  19*  GLUCOSE 141*  --  129*  BUN 78*  --  77*  CREATININE 16.98*  --  16.52*  CALCIUM 7.7*  --  7.7*  MG  --  2.0  --    GFR: Estimated Creatinine Clearance: 7 mL/min (A) (by C-G formula based on SCr of 16.52 mg/dL (H)). Liver Function Tests: Recent Labs  Lab 01/20/20 0956 01/21/20 0808  AST 28 24  ALT 44 42  ALKPHOS 99 95  BILITOT 0.5 0.5  PROT 6.7 6.6  ALBUMIN 2.4* 2.5*   No results for input(s): LIPASE, AMYLASE in the last 168 hours. No results for input(s): AMMONIA in the last 168 hours. Coagulation Profile: No results for input(s): INR, PROTIME in the last 168 hours. Cardiac Enzymes: No results for input(s): CKTOTAL, CKMB, CKMBINDEX, TROPONINI in the last 168 hours. BNP (last 3 results) No results for input(s): PROBNP in the last 8760 hours. HbA1C: No results for input(s): HGBA1C in the last 72 hours. CBG: No results for input(s): GLUCAP in the last 168 hours. Lipid Profile: No results for input(s): CHOL, HDL, LDLCALC, TRIG, CHOLHDL, LDLDIRECT in the last 72 hours. Thyroid Function Tests: No results for  input(s): TSH, T4TOTAL, FREET4, T3FREE, THYROIDAB in the last 72 hours. Anemia Panel: No results for input(s): VITAMINB12, FOLATE, FERRITIN, TIBC, IRON, RETICCTPCT in the last 72 hours. Urine analysis:    Component Value Date/Time   COLORURINE STRAW (A) 11/02/2017 2208   APPEARANCEUR CLEAR 11/02/2017 2208   LABSPEC 1.011 11/02/2017 2208   PHURINE 5.0 11/02/2017 2208   GLUCOSEU 50 (A) 11/02/2017 2208   HGBUR NEGATIVE 11/02/2017 2208   BILIRUBINUR NEGATIVE 11/02/2017 2208   KETONESUR 5 (A) 11/02/2017 2208   PROTEINUR 100 (A) 11/02/2017 2208   UROBILINOGEN 0.2 09/15/2009 2240   NITRITE NEGATIVE 11/02/2017 2208   LEUKOCYTESUR NEGATIVE 11/02/2017 2208   Sepsis  Labs: @LABRCNTIP (procalcitonin:4,lacticidven:4)  ) Recent Results (from the past 240 hour(s))  Blood culture (routine x 2)     Status: None (Preliminary result)   Collection Time: 01/20/20  1:07 PM   Specimen: BLOOD LEFT HAND  Result Value Ref Range Status   Specimen Description BLOOD LEFT HAND  Final   Special Requests   Final    BOTTLES DRAWN AEROBIC ONLY Blood Culture results may not be optimal due to an inadequate volume of blood received in culture bottles   Culture   Final    NO GROWTH < 24 HOURS Performed at [facility], [place] [address] [place], [place] [address]    Report Status PENDING  Incomplete  SARS Coronavirus 2 by RT PCR (hospital order, performed in [facility] hospital lab) Nasopharyngeal Nasopharyngeal Swab     Status: None   Collection Time: 01/20/20  1:12 PM   Specimen: Nasopharyngeal Swab  Result Value Ref Range Status   SARS Coronavirus 2 NEGATIVE NEGATIVE Final    Comment: (NOTE) SARS-CoV-2 target nucleic acids are NOT DETECTED.  The SARS-CoV-2 RNA is generally detectable in upper and lower respiratory specimens during the acute phase of infection. The lowest concentration of SARS-CoV-2 viral copies this assay can detect is 250 copies / mL. A negative result does not preclude SARS-CoV-2  infection and should not be used as the sole basis for treatment or other patient management decisions.  A negative result may occur with improper specimen collection / handling, submission of specimen other than nasopharyngeal swab, presence of viral mutation(s) within the areas targeted by this assay, and inadequate number of viral copies (<250 copies / mL). A negative result must be combined with clinical observations, patient history, and epidemiological information.  Fact Sheet for Patients:   StrictlyIdeas.no  Fact Sheet for Healthcare Providers: BankingDealers.co.za  This test is not yet approved or  cleared by the [place] FDA and has been authorized for detection and/or diagnosis of SARS-CoV-2 by FDA under an Emergency Use Authorization (EUA).  This EUA will remain in effect (meaning this test can be used) for the duration of the COVID-19 declaration under Section 564(b)(1) of the Act, 21 U.S.C. section 360bbb-3(b)(1), unless the authorization is terminated or revoked sooner.  Performed at [facility], [place] [address], [place], [place] [address]   Body fluid culture     Status: None (Preliminary result)   Collection Time: 01/20/20  6:15 PM   Specimen: Peritoneal Washings; Body Fluid  Result Value Ref Range Status   Specimen Description PERITONEAL FLUID  Final   Special Requests PERITONEAL DIALYSIS  Final   Gram Stain   Final    WBC PRESENT,BOTH PMN AND MONONUCLEAR NO ORGANISMS SEEN CYTOSPIN SMEAR    Culture   Final    NO GROWTH < 24 HOURS Performed at [facility], [place] [address], [place],  [address]    Report Status PENDING  Incomplete  Blood culture (routine x 2)     Status: None (Preliminary result)   Collection Time: 01/20/20  7:31 PM   Specimen: BLOOD LEFT HAND  Result Value Ref Range Status   Specimen Description BLOOD LEFT HAND  Final   Special Requests   Final    BOTTLES DRAWN  AEROBIC ONLY Blood Culture results may not be optimal due to an inadequate volume of blood received in culture bottles   Culture   Final    NO GROWTH < 12 HOURS Performed at [facility],  [address], [place], [place] [address]    Report Status PENDING  Incomplete      Studies: ECHOCARDIOGRAM COMPLETE  Result Date: 01/20/2020    ECHOCARDIOGRAM REPORT   Patient Name:   Adrian Frye Date of Exam: 01/20/2020 Medical Rec #:  [id number]             Height:       67.0 in Accession #:    [id number]            Weight:       274.0 lb Date of Birth:  06-Feb-1971             BSA:          2.312 m Patient Age:    33 years              BP:           112/61 mmHg Patient Gender: M                     HR:           90 bpm. Exam Location:  Inpatient Procedure: 2D Echo and Intracardiac Opacification Agent Indications:    Bacteremia  History:        Patient has prior history of Echocardiogram examinations, most                 recent 06/19/2013. Risk Factors:Hypertension and Dyslipidemia.  Sonographer:    Mikki Santee RDCS (AE) Referring Phys: [id number] Michell Heinrich PAHWANI IMPRESSIONS  1. Abnormal septal motion . Left ventricular ejection fraction, by estimation, is 50 to 55%. The left ventricle has low normal function. The left ventricle has no regional wall motion abnormalities. There is mild left ventricular hypertrophy. Left ventricular diastolic parameters were normal.  2. Right ventricular systolic function is normal. The right ventricular size is normal.  3. The mitral valve is normal in structure. No evidence of mitral valve regurgitation. No evidence of mitral stenosis.  4. The aortic valve is normal in structure. Aortic valve regurgitation is not visualized. No aortic stenosis is present.  5. The inferior vena cava is normal in size with greater than 50% respiratory variability, suggesting right atrial pressure of 3 mmHg. FINDINGS  Left Ventricle: Abnormal septal motion. Left ventricular ejection  fraction, by estimation, is 50 to 55%. The left ventricle has low normal function. The left ventricle has no regional wall motion abnormalities. Definity contrast agent was given IV to delineate the left ventricular endocardial borders. The left ventricular internal cavity size was normal in size. There is mild left ventricular hypertrophy. Left ventricular diastolic parameters were normal. Right Ventricle: The right ventricular size is normal. No increase in right ventricular wall thickness. Right ventricular systolic function is normal. Left Atrium: Left atrial size was normal in size. Right Atrium: Right atrial size was normal in size. Pericardium: There is no evidence of pericardial effusion. Mitral Valve: The mitral valve is normal in structure. Normal mobility of the mitral valve leaflets. No evidence of mitral valve regurgitation. No evidence of mitral valve stenosis. Tricuspid Valve: The tricuspid valve is normal in structure. Tricuspid valve regurgitation is trivial. No evidence of tricuspid stenosis. Aortic Valve: The aortic valve is normal in structure. Aortic valve regurgitation is not visualized. No aortic stenosis is present. Pulmonic Valve: The pulmonic valve was not well visualized. Pulmonic valve regurgitation is not visualized. No evidence of pulmonic stenosis. Aorta: The aortic root is normal in size and structure.  Venous: The inferior vena cava is normal in size with greater than 50% respiratory variability, suggesting right atrial pressure of 3 mmHg. IAS/Shunts: No atrial level shunt detected by color flow Doppler.  LEFT VENTRICLE PLAX 2D LVIDd:         4.40 cm  Diastology LVIDs:         3.00 cm  LV e' lateral:   11.50 cm/s LV PW:         1.30 cm  LV E/e' lateral: 6.5 LV IVS:        1.30 cm  LV e' medial:    10.20 cm/s LVOT diam:     2.50 cm  LV E/e' medial:  7.3 LV SV:         86 LV SV Index:   37 LVOT Area:     4.91 cm  RIGHT VENTRICLE RV S prime:     13.30 cm/s TAPSE (M-mode): 2.1 cm LEFT  ATRIUM             Index       RIGHT ATRIUM           Index LA diam:        3.60 cm 1.56 cm/m  RA Area:     17.00 cm LA Vol (A2C):   41.1 ml 17.78 ml/m RA Volume:   46.00 ml  19.90 ml/m LA Vol (A4C):   34.6 ml 14.97 ml/m LA Biplane Vol: 38.2 ml 16.52 ml/m  AORTIC VALVE LVOT Vmax:   94.50 cm/s LVOT Vmean:  64.400 cm/s LVOT VTI:    0.176 m  AORTA Ao Root diam: 3.70 cm MITRAL VALVE MV Area (PHT): 4.86 cm    SHUNTS MV Decel Time: 156 msec    Systemic VTI:  0.18 m MV E velocity: 74.90 cm/s  Systemic Diam: 2.50 cm MV A velocity: 67.30 cm/s MV E/A ratio:  1.11 Jenkins Rouge MD Electronically signed by Jenkins Rouge MD Signature Date/Time: 01/20/2020/3:47:50 PM    Final     Scheduled Meds: . calcitRIOL  0.5 mcg Oral Daily  . Chlorhexidine Gluconate Cloth  6 each Topical Daily  . ferric citrate  840 mg Oral TID WC  . gentamicin cream  1 application Topical Daily  . senna-docusate  1 tablet Oral BID  . vancomycin variable dose per unstable renal function (pharmacist dosing)   Does not apply See admin instructions    Continuous Infusions: . [START ON 01/22/2020] dialysis solution 1.5% low-MG/low-CA    . [START ON 01/22/2020] dialysis solution 1.5% low-MG/low-CA    . dialysis solution 2.5% low-MG/low-CA       LOS: 1 day     Alma Friendly, MD [facility]  If 7PM-7AM, please contact night-coverage [email or web address] 01/21/2020, 1:26 PM

## 2020-01-21 NOTE — Progress Notes (Signed)
PHARMACY - PHYSICIAN COMMUNICATION CRITICAL VALUE ALERT - BLOOD CULTURE IDENTIFICATION (BCID)  1/4 bottles positive for Staphylococcus lugdunensis  Name of physician (or Provider) Contacted: Dr. Lesia Sago  Changes to prescribed antibiotics required:  Continue vancomycin Follow sensitivities to narrow therapy  Norina Buzzard, PharmD PGY1 Pharmacy Resident 01/21/2020 6:10 PM

## 2020-01-21 NOTE — Progress Notes (Signed)
° ° °  [facility] has been requested to perform a transesophageal echocardiogram on this patient for bacteremia. After careful review of history and examination, the risks and benefits of transesophageal echocardiogram have been explained including risks of esophageal damage, perforation (1:10,000 risk), bleeding, pharyngeal hematoma as well as other potential complications associated with conscious sedation including aspiration, arrhythmia, respiratory failure and death. Alternatives to treatment were discussed, questions were answered. Patient is willing to proceed. Scheduled for tomorrow with Dr. Debara Pickett at 1:30pm.  Charlie Pitter, PA-C 01/21/2020 5:19 PM

## 2020-01-21 NOTE — Progress Notes (Signed)
Called Dialysis charge RN to assess/flush/redress Right chest  HD cath

## 2020-01-21 NOTE — Progress Notes (Signed)
[  x]Manual[] Template[] Copied  Added by: [x] Gifford Shave, RRT  [] Hover for details RT note. Pt. Has his home CPAP in room with him. He will put on himself when he is ready for bed. I told him to call respiratory if he needs anything. Pt. Resting comfortable, RT will continue to monitor.

## 2020-01-21 NOTE — Progress Notes (Signed)
Pt. States he 2.5 kg over his dry weight post PD tx. Anitra Lauth, PA aware. No new orders at thsi time

## 2020-01-21 NOTE — Consult Note (Signed)
[facility]    Date of Admission:  01/20/2020     Total days of antibiotics 2 Vancomycin               Reason for Consult: Staphylococcus bacteremia in setting of HD line     Referring Provider: Anice Paganini, [facility]  Primary Care Provider: Girtha Rm, NP-C   Assessment: Adrian Frye is a 49 y.o. male admitted from home with febrile illness and malaise and positive blood cultures from nephrology clinic; reportedly growing staphylococcus aureus. He has a HD line in place for blood draws - he was initially reluctant to get this out but in more conversation he is willing as long as it does not cause prolonged hospital stay.   TEE has been arranged for tomorrow afternoon at 1:30 pm - explained we cannot do this today due to the fact he has eaten already. They are satisfied with study tomorrow and hopeful he can be home by Thursday, pending TEE results. Otherwise ROS is negative for any concern over alternative source or metastatic site of infection.    Continue Vancomycin for now - repeat blood cultures from admission 9/06 pending. I spoke with Aldona Bar from nephrology and will go ahead with IR order to remove HD line.  Further plans and recommendations pending the above studies.     Plan: 1. Continue Vancomycin  2. Follow susceptibilities from Nephrology clinic   3. D/C HD line in IR asap  4. TEE tomorrow 9/08 1:30 pm - cardiology will consult with him officially this afternoon 5. NPO after MN  6. Follow blood cultures drawn here in hospital (was on keflex at home PTA)    Principal Problem:   Bacteremia due to Gram-positive bacteria Active Problems:   Morbid obesity ([place])   Hypertension   ESRD on peritoneal dialysis ([place])   Hypokalemia   Hyponatremia   Thrombocytopenia (HCC)   Anemia of chronic disease   Bacteremia   . calcitRIOL  0.5 mcg Oral Daily  . Chlorhexidine Gluconate Cloth  6 each Topical Daily  .  ferric citrate  840 mg Oral TID WC  . gentamicin cream  1 application Topical Daily  . gentamicin cream  1 application Topical Daily  . vancomycin variable dose per unstable renal function (pharmacist dosing)   Does not apply See admin instructions    HPI: Adrian Frye is a 49 y.o. male with ESRD on PD at home, HTN, morbid obesity, thrombocytopenia, anemia. He was feeling unwell at home that started about 5 days prior to hospitalization on 9/06. Had fevers at home (to > 102 F) with nausea and vomiting.  Had blood cultures drawn at nephrology office and growing GPCs --> advised he come to hospital for management / treatment. D/W Aldona Bar, PA and staphylococcus aureus on preliminary report with susceptibilities pending.   He states it is a "long story" about the catheter but it was not having any redness or drainage. It has been in place for blood draws since he is a hard stick and no longer uses it for HD.  No symptoms c/w peritonitis at onset of illness or now. 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 He is also very worried about staying here longer than needed due to COVID volumes. He does feel overall improved from before hospitalization and has  had had any fevers or chills since admission.    Review of Systems: Review of Systems  Constitutional: Positive for fever. Negative for chills, diaphoresis and malaise/fatigue.  Respiratory: Negative for cough.   Cardiovascular: Negative for chest pain and leg swelling.  Gastrointestinal: Positive for nausea. Negative for abdominal pain, diarrhea and vomiting.  Musculoskeletal: Negative for back pain and joint pain.  Neurological: Negative for dizziness, focal weakness and headaches.    Past Medical History:  Diagnosis Date  . Allergic rhinitis   . Allergy   . Anemia   . Asthma    as a child  . Benign essential hypertension   . Chronic kidney  disease    stage III-IV  . Dilated idiopathic cardiomyopathy ([place])    now resoved with EF 55% by echo 2011  . Edema   . Encounter for blood transfusion   . Heart disease   . Hyperlipidemia   . Hyperparathyroidism, secondary ([place])   . Hypertension   . Morbid obesity ([place])   . Sleep apnea    c-pap  . UGI bleed 2011   ASA    Social History   Tobacco Use  . Smoking status: Former Smoker    Years: 6.00    Types: Cigarettes    Quit date: 05/16/2009    Years since quitting: 10.6  . Smokeless tobacco: Never Used  . Tobacco comment: smoked 1 pack per week   Vaping Use  . Vaping Use: Never used  Substance Use Topics  . Alcohol use: Yes    Alcohol/week: 2.0 standard drinks    Types: 2 Cans of beer per week    Comment: occasionally  . Drug use: No    Family History  Problem Relation Age of Onset  . Diabetes Mother   . Heart failure Mother   . Hypertension Mother   . Heart disease Father   . Hypertension Brother   . Hypertension Brother   . Colon cancer Neg Hx   . Esophageal cancer Neg Hx   . Rectal cancer Neg Hx   . Stomach cancer Neg Hx    Allergies  Allergen Reactions  . Lisinopril Swelling and Anaphylaxis    Angioedema  Other reaction(s): Angioedema (ALLERGY/intolerance)  . Losartan Swelling and Anaphylaxis    OBJECTIVE: Blood pressure (!) 114/55, pulse 95, temperature 99 F (37.2 C), temperature source Oral, resp. rate (!) 24, height 5\' 7"  (1.702 m), weight 131.8 kg, SpO2 94 %.  Physical Exam Vitals reviewed.  HENT:     Mouth/Throat:     Mouth: No oral lesions.     Dentition: Normal dentition. No dental caries.  Eyes:     General: No scleral icterus. Cardiovascular:     Rate and Rhythm: Normal rate and regular rhythm.     Heart sounds: Normal heart sounds.  Pulmonary:     Effort: Pulmonary effort is normal.     Breath sounds: Normal breath sounds.  Chest:     Comments: HD line in place. Dressing clean. No significant tenderness observed.   Abdominal:     General: There is no distension.     Palpations: Abdomen is soft.     Tenderness: There is no abdominal tenderness.  Lymphadenopathy:     Cervical: No cervical adenopathy.  Skin:    General: Skin is warm and dry.     Findings: No rash.  Neurological:     Mental Status: He is alert and oriented to person, place, and time.     Lab  Results Lab Results  Component Value Date   WBC 17.2 (H) 01/21/2020   HGB 9.5 (L) 01/21/2020   HCT 29.3 (L) 01/21/2020   MCV 87.2 01/21/2020   PLT 52 (L) 01/21/2020    Lab Results  Component Value Date   CREATININE 16.52 (H) 01/21/2020   BUN 77 (H) 01/21/2020   NA 130 (L) 01/21/2020   K 3.5 01/21/2020   CL 91 (L) 01/21/2020   CO2 19 (L) 01/21/2020    Lab Results  Component Value Date   ALT 42 01/21/2020   AST 24 01/21/2020   ALKPHOS 95 01/21/2020   BILITOT 0.5 01/21/2020     Microbiology: Recent Results (from the past 240 hour(s))  Blood culture (routine x 2)     Status: None (Preliminary result)   Collection Time: 01/20/20  1:07 PM   Specimen: BLOOD LEFT HAND  Result Value Ref Range Status   Specimen Description BLOOD LEFT HAND  Final   Special Requests   Final    BOTTLES DRAWN AEROBIC ONLY Blood Culture results may not be optimal due to an inadequate volume of blood received in culture bottles   Culture   Final    NO GROWTH < 24 HOURS Performed at [facility], [address] [place], [place] [address]    Report Status PENDING  Incomplete  SARS Coronavirus 2 by RT PCR (hospital order, performed in [facility] hospital lab) Nasopharyngeal Nasopharyngeal Swab     Status: None   Collection Time: 01/20/20  1:12 PM   Specimen: Nasopharyngeal Swab  Result Value Ref Range Status   SARS Coronavirus 2 NEGATIVE NEGATIVE Final    Comment: (NOTE) SARS-CoV-2 target nucleic acids are NOT DETECTED.  The SARS-CoV-2 RNA is generally detectable in upper and lower respiratory specimens during the acute phase of infection.  The lowest concentration of SARS-CoV-2 viral copies this assay can detect is 250 copies / mL. A negative result does not preclude SARS-CoV-2 infection and should not be used as the sole basis for treatment or other patient management decisions.  A negative result may occur with improper specimen collection / handling, submission of specimen other than nasopharyngeal swab, presence of viral mutation(s) within the areas targeted by this assay, and inadequate number of viral copies (<250 copies / mL). A negative result must be combined with clinical observations, patient history, and epidemiological information.  Fact Sheet for Patients:   StrictlyIdeas.no  Fact Sheet for Healthcare Providers: BankingDealers.co.za  This test is not yet approved or  cleared by the [place] FDA and has been authorized for detection and/or diagnosis of SARS-CoV-2 by FDA under an Emergency Use Authorization (EUA).  This EUA will remain in effect (meaning this test can be used) for the duration of the COVID-19 declaration under Section 564(b)(1) of the Act, 21 U.S.C. section 360bbb-3(b)(1), unless the authorization is terminated or revoked sooner.  Performed at [facility], [place] [address] [place], [place] [address]   Body fluid culture     Status: None (Preliminary result)   Collection Time: 01/20/20  6:15 PM   Specimen: Peritoneal Washings; Body Fluid  Result Value Ref Range Status   Specimen Description PERITONEAL FLUID  Final   Special Requests PERITONEAL DIALYSIS  Final   Gram Stain   Final    WBC PRESENT,BOTH PMN AND MONONUCLEAR NO ORGANISMS SEEN CYTOSPIN SMEAR    Culture   Final    NO GROWTH < 24 HOURS Performed at [facility], [address] [place], [place] [address]    Report Status PENDING  Incomplete  Blood culture (routine x 2)     Status: None (Preliminary result)   Collection Time: 01/20/20  7:31 PM   Specimen:  BLOOD LEFT HAND  Result Value Ref Range Status   Specimen Description BLOOD LEFT HAND  Final   Special Requests   Final    BOTTLES DRAWN AEROBIC ONLY Blood Culture results may not be optimal due to an inadequate volume of blood received in culture bottles   Culture   Final    NO GROWTH < 12 HOURS Performed at [facility], [place] [address], [place], [place] [address]    Report Status PENDING  Incomplete     Janene Madeira, MSN, NP-C [facility] [email or web address] Pager: [phone] Office: [place]: [phone]  01/21/2020 12:10 PM

## 2020-01-21 NOTE — Progress Notes (Addendum)
[facility] Progress Note   Subjective:    Patient seen in room, had PD overnight without issue. Discussed case with patient's outpatient nephrologist, Dr. Joelyn Oms, who also recommends removing TDC. Also spoke to outpatient PD nurse about patient's lab draw concerns- RN reports there is a nurse there 5 days per week who has successfully drawn peripheral labs on the patient without any issues and can continue to draw his monthly labs. Patient reported he wants to leave ASAP and will get [facility] removed outpatient if needed.  Consulted ID for recommendations regarding TDC and antibiotics and patient was agreeable to remove TDC and undergo TEE. IR consulted for line removal.   Patient denies any SOB, CP, palpitations, dizziness, abdominal pain, N/V/D.   Objective Vitals:   01/20/20 1615 01/20/20 1818 01/20/20 2048 01/21/20 0531  BP: (!) 158/98 (!) 142/89 (!) 155/74 (!) 114/55  Pulse: 97 87 92 95  Resp: 18 18 (!) 22 (!) 24  Temp: 99 F (37.2 C) 99.1 F (37.3 C) 99.3 F (37.4 C) 99 F (37.2 C)  TempSrc: Oral Oral Oral Oral  SpO2: 100%  98% 94%  Weight: 129.4 kg  131.8 kg   Height: 5\' 7"  (1.702 m)      Physical Exam General: Well developed male, alert and in NAD Heart: RRR, no murmurs, rubs or gallops Lungs: CTA bilaterally without wheezing, rhonchi or rales Abdomen: PD catheter draining clear fluid. Abdomen soft, non-tender, non-distended, +BS Extremities: No edema bilateral lower extremities  Additional Objective Labs: Basic Metabolic Panel: Recent Labs  Lab 01/20/20 0956 01/21/20 0808  NA 128* 130*  K 3.0* 3.5  CL 89* 91*  CO2 18* 19*  GLUCOSE 141* 129*  BUN 78* 77*  CREATININE 16.98* 16.52*  CALCIUM 7.7* 7.7*   Liver Function Tests: Recent Labs  Lab 01/20/20 0956 01/21/20 0808  AST 28 24  ALT 44 42  ALKPHOS 99 95  BILITOT 0.5 0.5  PROT 6.7 6.6  ALBUMIN 2.4* 2.5*   CBC: Recent Labs  Lab 01/20/20 0956 01/21/20 0808  WBC 18.1* 17.2*  NEUTROABS  13.4*  --   HGB 9.9* 9.5*  HCT 30.9* 29.3*  MCV 88.0 87.2  PLT 44* 52*   Blood Culture    Component Value Date/Time   SDES BLOOD LEFT HAND 01/20/2020 1931   SPECREQUEST  01/20/2020 1931    BOTTLES DRAWN AEROBIC ONLY Blood Culture results may not be optimal due to an inadequate volume of blood received in culture bottles   CULT  01/20/2020 1931    NO GROWTH < 12 HOURS Performed at [facility], [place] [address], [place], [place] [address]    REPTSTATUS PENDING 01/20/2020 1931    Studies/Results: ECHOCARDIOGRAM COMPLETE  Result Date: 01/20/2020    ECHOCARDIOGRAM REPORT   Patient Name:   Adrian Frye Date of Exam: 01/20/2020 Medical Rec #:  [id number]             Height:       67.0 in Accession #:    [id number]            Weight:       274.0 lb Date of Birth:  1971/03/30             BSA:          2.312 m Patient Age:    49 years              BP:  112/61 mmHg Patient Gender: M                     HR:           90 bpm. Exam Location:  Inpatient Procedure: 2D Echo and Intracardiac Opacification Agent Indications:    Bacteremia  History:        Patient has prior history of Echocardiogram examinations, most                 recent 06/19/2013. Risk Factors:Hypertension and Dyslipidemia.  Sonographer:    Mikki Santee RDCS (AE) Referring Phys: [id number] Michell Heinrich PAHWANI IMPRESSIONS  1. Abnormal septal motion . Left ventricular ejection fraction, by estimation, is 50 to 55%. The left ventricle has low normal function. The left ventricle has no regional wall motion abnormalities. There is mild left ventricular hypertrophy. Left ventricular diastolic parameters were normal.  2. Right ventricular systolic function is normal. The right ventricular size is normal.  3. The mitral valve is normal in structure. No evidence of mitral valve regurgitation. No evidence of mitral stenosis.  4. The aortic valve is normal in structure. Aortic valve regurgitation is not visualized. No aortic stenosis is  present.  5. The inferior vena cava is normal in size with greater than 50% respiratory variability, suggesting right atrial pressure of 3 mmHg. FINDINGS  Left Ventricle: Abnormal septal motion. Left ventricular ejection fraction, by estimation, is 50 to 55%. The left ventricle has low normal function. The left ventricle has no regional wall motion abnormalities. Definity contrast agent was given IV to delineate the left ventricular endocardial borders. The left ventricular internal cavity size was normal in size. There is mild left ventricular hypertrophy. Left ventricular diastolic parameters were normal. Right Ventricle: The right ventricular size is normal. No increase in right ventricular wall thickness. Right ventricular systolic function is normal. Left Atrium: Left atrial size was normal in size. Right Atrium: Right atrial size was normal in size. Pericardium: There is no evidence of pericardial effusion. Mitral Valve: The mitral valve is normal in structure. Normal mobility of the mitral valve leaflets. No evidence of mitral valve regurgitation. No evidence of mitral valve stenosis. Tricuspid Valve: The tricuspid valve is normal in structure. Tricuspid valve regurgitation is trivial. No evidence of tricuspid stenosis. Aortic Valve: The aortic valve is normal in structure. Aortic valve regurgitation is not visualized. No aortic stenosis is present. Pulmonic Valve: The pulmonic valve was not well visualized. Pulmonic valve regurgitation is not visualized. No evidence of pulmonic stenosis. Aorta: The aortic root is normal in size and structure. Venous: The inferior vena cava is normal in size with greater than 50% respiratory variability, suggesting right atrial pressure of 3 mmHg. IAS/Shunts: No atrial level shunt detected by color flow Doppler.  LEFT VENTRICLE PLAX 2D LVIDd:         4.40 cm  Diastology LVIDs:         3.00 cm  LV e' lateral:   11.50 cm/s LV PW:         1.30 cm  LV E/e' lateral: 6.5 LV IVS:         1.30 cm  LV e' medial:    10.20 cm/s LVOT diam:     2.50 cm  LV E/e' medial:  7.3 LV SV:         86 LV SV Index:   37 LVOT Area:     4.91 cm  RIGHT VENTRICLE RV S prime:     13.30  cm/s TAPSE (M-mode): 2.1 cm LEFT ATRIUM             Index       RIGHT ATRIUM           Index LA diam:        3.60 cm 1.56 cm/m  RA Area:     17.00 cm LA Vol (A2C):   41.1 ml 17.78 ml/m RA Volume:   46.00 ml  19.90 ml/m LA Vol (A4C):   34.6 ml 14.97 ml/m LA Biplane Vol: 38.2 ml 16.52 ml/m  AORTIC VALVE LVOT Vmax:   94.50 cm/s LVOT Vmean:  64.400 cm/s LVOT VTI:    0.176 m  AORTA Ao Root diam: 3.70 cm MITRAL VALVE MV Area (PHT): 4.86 cm    SHUNTS MV Decel Time: 156 msec    Systemic VTI:  0.18 m MV E velocity: 74.90 cm/s  Systemic Diam: 2.50 cm MV A velocity: 67.30 cm/s MV E/A ratio:  1.11 Jenkins Rouge MD Electronically signed by Jenkins Rouge MD Signature Date/Time: 01/20/2020/3:47:50 PM    Final    Medications: . dialysis solution 1.5% low-MG/low-CA    . dialysis solution 1.5% low-MG/low-CA     . calcitRIOL  0.5 mcg Oral Daily  . Chlorhexidine Gluconate Cloth  6 each Topical Daily  . ferric citrate  840 mg Oral TID WC  . gentamicin cream  1 application Topical Daily  . vancomycin variable dose per unstable renal function (pharmacist dosing)   Does not apply See admin instructions    Dialysis Orders: CCPD 7x week 6 exchanges, all 1.5% dextrose, fill volume 3000 ml Day dwell 1.5% dextrose, 2000 mL EDW 128kg  01/17/20 labs Hgb 10.8, tsat 25%, WBC 13.07, Plt 82, Na 132, K+ 3.5, Bicarbonate 20, Alb 3.7, Ca 8.0, Phos 6.4  Assessment/Plan: 1. Bacteremia: Outpatient blood cultures growing staph. Patient started on IV vancomycin. Suspect TDC is likely source of infection given recent drainage from exit site (no longer in use for dialysis but patient left in place for blood draws). Dr. Joelyn Oms, patient's outpatient nephrologist, agrees with recommendation to remove [facility] and PD clinic confirms they have an RN who has  successfully drawn blood peripherally and can continue to do so. Seen by ID who recommend TDC removal and TEE. Patient is agreeable with this plan. IR consulted for [facility] removal. Appreciate assistance from both ID and IR.  2.  ESRD:  On PD, continue 6 exchanges overnight (3L) and daytime dwell (2L) with all 1.5% dextrose. Outpatient clearances not to goal but patient does not want to return to HD, hoping for eventual transplant after weight loss.  3. Hypokalemia: Given 89mEq KCl 9/6, K+ 3.5 today. Continue daily BMP and replete as needed.  4.  Hypertension/volume: BP controlled, does not appear volume overloaded. Continue all 1.5% exchanges. Hyponatremia noted with Na 128- improved to 130 today Continue home meds (amlodipine).  5.  Anemia: Hgb 9.9 > 9.5. Will start aranesp 6.  Metabolic bone disease: Corrected calcium 9.0. Continue calcitriol 0.27mcg daily and auryxia 4 tabs TID with meals.  7. Thrombocytopenia: Plt 44 > 52 today, anticoagulation on hold. Per primary.   Anice Paganini, PA-C 01/21/2020, 11:51 AM  [facility] Pager: [phone]  Addendum: Informed by dialysis RN that patient is 2.5kg over EDW after HD today, will change to 4 exchanges with 1.5% dextrose and 2 exchanges with 2.5% dextrose.   Anice Paganini, PA-C 01/21/2020, 12:20 PM  [employer]

## 2020-01-21 NOTE — Progress Notes (Signed)
Patient has refused blood work for the second time.This nurse asked lab to reschedule for after eight thirty this morning.Per lab if patient continues to refuse blood work will be cancelled.

## 2020-01-21 NOTE — Progress Notes (Signed)
Patient refusing to have blood work drawn at this time.Patient states," Lab can come back later in the morning.It's to early right now."Will reschedule lab work till after five a.m.

## 2020-01-21 NOTE — Progress Notes (Signed)
Patient lactic acid 2.3 tonight.Paged Dr.Zierle-Ghosh.New orders received.

## 2020-01-21 NOTE — Procedures (Signed)
PROCEDURE SUMMARY:  Successful removal of right IJ tunneled HD catheter, removed intact.  No immediate complications.  EBL < 3 mL. Pressure dressing (gauze + tegaderm) applied to site. Patient tolerated well.   Please see imaging section of Epic for full dictation.   Earley Abide PA-C 01/21/2020 4:26 PM

## 2020-01-22 ENCOUNTER — Encounter (HOSPITAL_COMMUNITY): Payer: Self-pay | Admitting: Internal Medicine

## 2020-01-22 ENCOUNTER — Inpatient Hospital Stay (HOSPITAL_COMMUNITY): Payer: Federal, State, Local not specified - PPO

## 2020-01-22 ENCOUNTER — Encounter (HOSPITAL_COMMUNITY)
Admission: EM | Disposition: A | Discharge status: Home Health | Payer: Self-pay | Source: Home / Self Care | Attending: Internal Medicine

## 2020-01-22 ENCOUNTER — Inpatient Hospital Stay (HOSPITAL_COMMUNITY): Payer: Federal, State, Local not specified - PPO | Admitting: Anesthesiology

## 2020-01-22 DIAGNOSIS — N186 End stage renal disease: Secondary | ICD-10-CM | POA: Diagnosis not present

## 2020-01-22 DIAGNOSIS — Z992 Dependence on renal dialysis: Secondary | ICD-10-CM | POA: Diagnosis not present

## 2020-01-22 DIAGNOSIS — I10 Essential (primary) hypertension: Secondary | ICD-10-CM

## 2020-01-22 DIAGNOSIS — R7881 Bacteremia: Secondary | ICD-10-CM

## 2020-01-22 DIAGNOSIS — N2581 Secondary hyperparathyroidism of renal origin: Secondary | ICD-10-CM | POA: Diagnosis not present

## 2020-01-22 HISTORY — PX: TEE WITHOUT CARDIOVERSION: SHX5443

## 2020-01-22 LAB — CBC WITH DIFFERENTIAL/PLATELET
Abs Immature Granulocytes: 0.74 10*3/uL — ABNORMAL HIGH (ref 0.00–0.07)
Basophils Absolute: 0.1 10*3/uL (ref 0.0–0.1)
Basophils Relative: 0 %
Eosinophils Absolute: 0.2 10*3/uL (ref 0.0–0.5)
Eosinophils Relative: 1 %
HCT: 29.8 % — ABNORMAL LOW (ref 39.0–52.0)
Hemoglobin: 9.5 g/dL — ABNORMAL LOW (ref 13.0–17.0)
Immature Granulocytes: 5 %
Lymphocytes Relative: 8 %
Lymphs Abs: 1.3 10*3/uL (ref 0.7–4.0)
MCH: 28.3 pg (ref 26.0–34.0)
MCHC: 31.9 g/dL (ref 30.0–36.0)
MCV: 88.7 fL (ref 80.0–100.0)
Monocytes Absolute: 1.9 10*3/uL — ABNORMAL HIGH (ref 0.1–1.0)
Monocytes Relative: 13 %
Neutro Abs: 11 10*3/uL — ABNORMAL HIGH (ref 1.7–7.7)
Neutrophils Relative %: 73 %
Platelets: 70 10*3/uL — ABNORMAL LOW (ref 150–400)
RBC: 3.36 MIL/uL — ABNORMAL LOW (ref 4.22–5.81)
RDW: 18.1 % — ABNORMAL HIGH (ref 11.5–15.5)
WBC: 15.1 10*3/uL — ABNORMAL HIGH (ref 4.0–10.5)
nRBC: 0 % (ref 0.0–0.2)

## 2020-01-22 LAB — RENAL FUNCTION PANEL
Albumin: 2.7 g/dL — ABNORMAL LOW (ref 3.5–5.0)
Anion gap: 21 — ABNORMAL HIGH (ref 5–15)
BUN: 77 mg/dL — ABNORMAL HIGH (ref 6–20)
CO2: 19 mmol/L — ABNORMAL LOW (ref 22–32)
Calcium: 8 mg/dL — ABNORMAL LOW (ref 8.9–10.3)
Chloride: 90 mmol/L — ABNORMAL LOW (ref 98–111)
Creatinine, Ser: 16.3 mg/dL — ABNORMAL HIGH (ref 0.61–1.24)
GFR calc Af Amer: 3 mL/min — ABNORMAL LOW (ref >60)
GFR calc non Af Amer: 3 mL/min — ABNORMAL LOW (ref >60)
Glucose, Bld: 128 mg/dL — ABNORMAL HIGH (ref 70–99)
Phosphorus: 6 mg/dL — ABNORMAL HIGH (ref 2.5–4.6)
Potassium: 3.4 mmol/L — ABNORMAL LOW (ref 3.5–5.1)
Sodium: 130 mmol/L — ABNORMAL LOW (ref 135–145)

## 2020-01-22 LAB — PATHOLOGIST SMEAR REVIEW

## 2020-01-22 SURGERY — ECHOCARDIOGRAM, TRANSESOPHAGEAL
Anesthesia: Monitor Anesthesia Care

## 2020-01-22 MED ORDER — PROPOFOL 10 MG/ML IV BOLUS
INTRAVENOUS | Status: DC | PRN
  Administered 2020-01-22: 100 mg via INTRAVENOUS

## 2020-01-22 MED ORDER — BUTAMBEN-TETRACAINE-BENZOCAINE 2-2-14 % EX AERO
INHALATION_SPRAY | CUTANEOUS | Status: DC | PRN
  Administered 2020-01-22: 2 via TOPICAL

## 2020-01-22 MED ORDER — ONDANSETRON HCL 4 MG/2ML IJ SOLN
INTRAMUSCULAR | Status: DC | PRN
  Administered 2020-01-22: 4 mg via INTRAVENOUS

## 2020-01-22 MED ORDER — DELFLEX-LC/1.5% DEXTROSE 344 MOSM/L IP SOLN: INTRAPERITONEAL | Status: DC

## 2020-01-22 MED ORDER — DELFLEX-LC/1.5% DEXTROSE 344 MOSM/L IP SOLN: INTRAPERITONEAL | Status: AC

## 2020-01-22 MED ORDER — SODIUM CHLORIDE 0.9 % IV SOLN: INTRAVENOUS | Status: DC | PRN

## 2020-01-22 MED ORDER — GENTAMICIN SULFATE 0.1 % EX CREA
1.0000 "application " | TOPICAL_CREAM | Freq: Every day | CUTANEOUS | Status: DC
  Administered 2020-01-23: 1 via TOPICAL
  Filled 2020-01-22: qty 15

## 2020-01-22 MED ORDER — POTASSIUM CHLORIDE CRYS ER 20 MEQ PO TBCR: 20.0000 meq | EXTENDED_RELEASE_TABLET | Freq: Once | ORAL | Status: DC

## 2020-01-22 MED ORDER — PROPOFOL 500 MG/50ML IV EMUL
INTRAVENOUS | Status: DC | PRN
  Administered 2020-01-22: 100 ug/kg/min via INTRAVENOUS

## 2020-01-22 NOTE — Progress Notes (Signed)
Pt will self administer home CPAP again tonight.

## 2020-01-22 NOTE — Progress Notes (Signed)
[facility]   Reason for visit: Follow up on bacteremia  Interval History: TEE c/w pulmonic valve endocarditis.  WBC down to 15.1.  Afebrile.  Feels fine. Upset about news of having to stay.   Physical Exam: Constitutional:  Vitals:   01/22/20 1456 01/22/20 1526  BP: 140/78 137/88  Pulse: 95 93  Resp: (!) 27 18  Temp:  98 F (36.7 C)  SpO2: 95% 96%   patient appears in NAD Respiratory: Normal respiratory effort; CTA B Cardiovascular: RRR GI: soft, nt, nd  Review of Systems: Constitutional: negative for fevers and chills Gastrointestinal: negative for nausea and diarrhea  Lab Results  Component Value Date   WBC 15.1 (H) 01/22/2020   HGB 9.5 (L) 01/22/2020   HCT 29.8 (L) 01/22/2020   MCV 88.7 01/22/2020   PLT 70 (L) 01/22/2020    Lab Results  Component Value Date   CREATININE 16.30 (H) 01/22/2020   BUN 77 (H) 01/22/2020   NA 130 (L) 01/22/2020   K 3.4 (L) 01/22/2020   CL 90 (L) 01/22/2020   CO2 19 (L) 01/22/2020    Lab Results  Component Value Date   ALT 42 01/21/2020   AST 24 01/21/2020   ALKPHOS 95 01/21/2020     Microbiology: Recent Results (from the past 240 hour(s))  Blood culture (routine x 2)     Status: Abnormal (Preliminary result)   Collection Time: 01/20/20  1:07 PM   Specimen: BLOOD LEFT HAND  Result Value Ref Range Status   Specimen Description BLOOD LEFT HAND  Final   Special Requests   Final    BOTTLES DRAWN AEROBIC ONLY Blood Culture results may not be optimal due to an inadequate volume of blood received in culture bottles   Culture  Setup Time   Final    GRAM POSITIVE COCCI IN CLUSTERS AEROBIC BOTTLE ONLY CRITICAL RESULT CALLED TO, READ BACK BY AND VERIFIED WITH: SARAH MILLS PHARMD @1804  01/21/20 EB    Culture (A)  Final    STAPHYLOCOCCUS LUGDUNENSIS SUSCEPTIBILITIES TO FOLLOW Performed at [facility], [address], [place], [place] [address]    Report Status PENDING  Incomplete  Blood Culture  ID Panel (Reflexed)     Status: Abnormal   Collection Time: 01/20/20  1:07 PM  Result Value Ref Range Status   Enterococcus faecalis NOT DETECTED NOT DETECTED Final   Enterococcus Faecium NOT DETECTED NOT DETECTED Final   Listeria monocytogenes NOT DETECTED NOT DETECTED Final   Staphylococcus species DETECTED (A) NOT DETECTED Final    Comment: CRITICAL RESULT CALLED TO, READ BACK BY AND VERIFIED WITH: S,MILLS PHARMD @1804  01/21/20 EB    Staphylococcus aureus (BCID) NOT DETECTED NOT DETECTED Final   Staphylococcus epidermidis NOT DETECTED NOT DETECTED Final   Staphylococcus lugdunensis DETECTED (A) NOT DETECTED Final    Comment: CRITICAL RESULT CALLED TO, READ BACK BY AND VERIFIED WITH: S,MILLS PHARMD @1804  01/21/20 EB    Streptococcus species NOT DETECTED NOT DETECTED Final   Streptococcus agalactiae NOT DETECTED NOT DETECTED Final   Streptococcus pneumoniae NOT DETECTED NOT DETECTED Final   Streptococcus pyogenes NOT DETECTED NOT DETECTED Final   A.calcoaceticus-baumannii NOT DETECTED NOT DETECTED Final   Bacteroides fragilis NOT DETECTED NOT DETECTED Final   Enterobacterales NOT DETECTED NOT DETECTED Final   Enterobacter cloacae complex NOT DETECTED NOT DETECTED Final   Escherichia coli NOT DETECTED NOT DETECTED Final   Klebsiella aerogenes NOT DETECTED NOT DETECTED Final   Klebsiella oxytoca  NOT DETECTED NOT DETECTED Final   Klebsiella pneumoniae NOT DETECTED NOT DETECTED Final   Proteus species NOT DETECTED NOT DETECTED Final   Salmonella species NOT DETECTED NOT DETECTED Final   Serratia marcescens NOT DETECTED NOT DETECTED Final   Haemophilus influenzae NOT DETECTED NOT DETECTED Final   Neisseria meningitidis NOT DETECTED NOT DETECTED Final   Pseudomonas aeruginosa NOT DETECTED NOT DETECTED Final   Stenotrophomonas maltophilia NOT DETECTED NOT DETECTED Final   Candida albicans NOT DETECTED NOT DETECTED Final   Candida auris NOT DETECTED NOT DETECTED Final   Candida glabrata NOT  DETECTED NOT DETECTED Final   Candida krusei NOT DETECTED NOT DETECTED Final   Candida parapsilosis NOT DETECTED NOT DETECTED Final   Candida tropicalis NOT DETECTED NOT DETECTED Final   Cryptococcus neoformans/gattii NOT DETECTED NOT DETECTED Final   Methicillin resistance mecA/C NOT DETECTED NOT DETECTED Final    Comment: Performed at [facility], [place] [address] [place], [place] [address]  SARS Coronavirus 2 by RT PCR (hospital order, performed in [facility] hospital lab) Nasopharyngeal Nasopharyngeal Swab     Status: None   Collection Time: 01/20/20  1:12 PM   Specimen: Nasopharyngeal Swab  Result Value Ref Range Status   SARS Coronavirus 2 NEGATIVE NEGATIVE Final    Comment: (NOTE) SARS-CoV-2 target nucleic acids are NOT DETECTED.  The SARS-CoV-2 RNA is generally detectable in upper and lower respiratory specimens during the acute phase of infection. The lowest concentration of SARS-CoV-2 viral copies this assay can detect is 250 copies / mL. A negative result does not preclude SARS-CoV-2 infection and should not be used as the sole basis for treatment or other patient management decisions.  A negative result may occur with improper specimen collection / handling, submission of specimen other than nasopharyngeal swab, presence of viral mutation(s) within the areas targeted by this assay, and inadequate number of viral copies (<250 copies / mL). A negative result must be combined with clinical observations, patient history, and epidemiological information.  Fact Sheet for Patients:   StrictlyIdeas.no  Fact Sheet for Healthcare Providers: BankingDealers.co.za  This test is not yet approved or  cleared by the [place] FDA and has been authorized for detection and/or diagnosis of SARS-CoV-2 by FDA under an Emergency Use Authorization (EUA).  This EUA will remain in effect (meaning this test can be used) for the duration  of the COVID-19 declaration under Section 564(b)(1) of the Act, 21 U.S.C. section 360bbb-3(b)(1), unless the authorization is terminated or revoked sooner.  Performed at [facility], [place] [address], [place], [place] [address]   Body fluid culture     Status: None (Preliminary result)   Collection Time: 01/20/20  6:15 PM   Specimen: Peritoneal Washings; Body Fluid  Result Value Ref Range Status   Specimen Description PERITONEAL FLUID  Final   Special Requests PERITONEAL DIALYSIS  Final   Gram Stain   Final    WBC PRESENT,BOTH PMN AND MONONUCLEAR NO ORGANISMS SEEN CYTOSPIN SMEAR    Culture   Final    NO GROWTH 2 DAYS Performed at [facility], [address], [place],  [address]    Report Status PENDING  Incomplete  Blood culture (routine x 2)     Status: Abnormal (Preliminary result)   Collection Time: 01/20/20  7:31 PM   Specimen: BLOOD LEFT HAND  Result Value Ref Range Status   Specimen Description BLOOD LEFT HAND  Final   Special Requests   Final  BOTTLES DRAWN AEROBIC ONLY Blood Culture results may not be optimal due to an inadequate volume of blood received in culture bottles   Culture  Setup Time   Final    AEROBIC BOTTLE ONLY GRAM POSITIVE COCCI IN CLUSTERS CRITICAL RESULT CALLED TO, READ BACK BY AND VERIFIED WITH: L SEAY PHARMD 01/22/20 0223 JDW Performed at [facility], [address] [place], [place] [address]    Culture STAPHYLOCOCCUS LUGDUNENSIS (A)  Final   Report Status PENDING  Incomplete    Impression/Plan:  1. Pulmonic valve endocarditis - he will need 6 weeks of IV antibiotics with either vancomycin or cefazolin via central line.    2.  Bacteremia - from above.  Need to get sensitivities from outpatient center.   3.  Access - ok for central line placement on Friday if repeat blood cultures today remain negative.

## 2020-01-22 NOTE — CV Procedure (Signed)
TRANSESOPHAGEAL ECHOCARDIOGRAM (TEE) NOTE  INDICATIONS: infective endocarditis  PROCEDURE:   Informed consent was obtained prior to the procedure. The risks, benefits and alternatives for the procedure were discussed and the patient comprehended these risks.  Risks include, but are not limited to, cough, sore throat, vomiting, nausea, somnolence, esophageal and stomach trauma or perforation, bleeding, low blood pressure, aspiration, pneumonia, infection, trauma to the teeth and death.    After a procedural time-out, the patient was given propofol per anesthesia for sedation.  The patient's heart rate, blood pressure, and oxygen saturation are monitored continuously during the procedure.The oropharynx was anesthetized with 2 topical cetacaine sprays.  The transesophageal probe was inserted in the esophagus and stomach without difficulty and multiple views were obtained.  The patient was kept under observation until the patient left the procedure room. I was present face-to-face 100% of this time. The patient left the procedure room in stable condition.   Agitated microbubble saline contrast was not administered.  COMPLICATIONS:    There were no immediate complications.  Findings:  1. LEFT VENTRICLE: The left ventricular wall thickness is mildly increased.  The left ventricular cavity is normal in size. Wall motion is normal.  LVEF is 50-55%.  2. RIGHT VENTRICLE:  The right ventricle is normal in structure and function without any thrombus or masses.    3. LEFT ATRIUM:  The left atrium is normal in size without any thrombus or masses.  There is not spontaneous echo contrast ("smoke") in the left atrium consistent with a low flow state.  4. LEFT ATRIAL APPENDAGE:  The left atrial appendage is free of any thrombus or masses. The appendage has single lobes. Pulse doppler indicates moderate flow in the appendage.  5. ATRIAL SEPTUM:  The atrial septum appears intact and is free of thrombus  and/or masses.  There is no evidence for interatrial shunting by color doppler and saline microbubble.  6. RIGHT ATRIUM:  The right atrium is normal in size and function without any thrombus or masses.  7. MITRAL VALVE:  The mitral valve is normal in structure and function with trivial regurgitation.  There were no vegetations or stenosis.  8. AORTIC VALVE:  The aortic valve is trileaflet, normal in structure and function with no regurgitation.  There were no vegetations or stenosis  9. TRICUSPID VALVE:  The tricuspid valve is normal in structure and function with trivial regurgitation.  There were no vegetations or stenosis  10.  PULMONIC VALVE:  The pulmonic valve is poorly visualized, but abnormal. There was a suggestion of mobile density on TEE that was confirmed with surface imaging, measuring 1x1,5 cm consistent with vegetation. There is associated Moderate regurgitation.  There is no stenosis.   11. AORTIC ARCH, ASCENDING AND DESCENDING AORTA:  There was grade 1 Ron Parker et. Al, 1992) atherosclerosis of the ascending aorta, aortic arch, or proximal descending aorta.  12. PULMONARY VEINS: Anomalous pulmonary venous return was not noted.  13. PERICARDIUM: The pericardium appeared normal and non-thickened.  There is no pericardial effusion.  IMPRESSION:   1. 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 No LAA thrombus 3. Negative for PFO by color doppler 4. LVEF 50-55%  RECOMMENDATIONS:    1. Recommend 6-8 weeks of IV antibiotics for pulmonic valve endocarditis as per ID recommendations.    Time Spent Directly with the Patient:  60 minutes   Pixie Casino, MD, [facility], [place]  [facility]  Medical Director of the [facility]  [facility] Diplomate of the American Board of Clinical Lipidology Attending Cardiologist  Direct Dial: [phone]  Fax: [phone]   Website:  [email or web address] 01/22/2020, 2:34 PM

## 2020-01-22 NOTE — Progress Notes (Signed)
PROGRESS NOTE    Adrian Frye  HLK:[id number] DOB: 1970-07-20 DOA: 01/20/2020 PCP: Girtha Rm, NP-C   Brief Narrative: 49 year old with past medical history significant for ESRD on PD, hypertension, morbid obesity, sleep apnea, TDC for blood draws, presented to the ED not feeling well and positive blood cultures.  Patient report feeling unwell for 5 days prior to admission, report fever nausea and chills.  Blood cultures were taken and he was a started on cephalexin.  His blood cultures came back positive for staph aureus, therefore patient was recommended to present to the ER for further evaluation and management.  Noticed some drainage from [facility].   -Patient admitted with staph Aureus  bacteremia, secondary to [facility] infection.  He underwent removal of right IJ tunnel hemodialysis catheter on 9 11/2019.  -Patient underwent TEE 9/8; pulmonic valve: There was a suggestion of mobile density on TEE that was confirmed with surface imaging, measuring 1x1,5 centimeter consistent with vegetation.  There is associated moderate rehabilitation.  There is not a stenosis  Assessment & Plan:   Principal Problem:   Bacteremia due to Gram-positive bacteria Active Problems:   Morbid obesity ([place])   Hypertension   ESRD on peritoneal dialysis ([place])   Hypokalemia   Hyponatremia   Thrombocytopenia ([facility])   Anemia of chronic disease   Bacteremia  1-Staph  bacteremia, pulmonic valve endocarditis -Awaiting sensitivity from the outpatient center -Follow blood cultures from 9/06 staph lugdunensis.  -Okay for central line placement on Friday if repeated blood cultures today remain negative per ID recommendation. -He will need 6 weeks of IV antibiotic (Vancomycin vs Cefazolin depending on culture).  -Removal of right IJ tunnel hemodialysis catheter on 9 11/2019.   2-sepsis secondary to staph bacteremia in the setting of TDC; Presented with leukocytosis, tachycardia, Tachypnea, lactic acidosis Continue  with IV antibiotics   ESRD on PD, anion gap metabolic acidosis Management per nephrologist  Hypokalemia: Management per nephrology  Hypertension: Continue with Norvasc  Anemia: Hemoglobin stable at 9  Thrombocytopenia acute on chronic: Improving Obstructive sleep apnea: Continue with CPAP\ Morbid obesity will need lifestyle modifications   Estimated body mass index is 44.89 kg/m as calculated from the following:   Height as of this encounter: 5\' 7"  (1.702 m).   Weight as of this encounter: 130 kg.   DVT prophylaxis: SCDs due to thrombocytopenia if continues to improve will consider heparin Code Status: Full code Family Communication: Care discussed with patient Disposition Plan:  Status is: Inpatient  Remains inpatient appropriate because:IV treatments appropriate due to intensity of illness or inability to take PO   Dispo: The patient is from: Home              Anticipated d/c is to: Home              Anticipated d/c date is: 2 days              Patient currently is not medically stable to d/c.        Consultants:   ID  Cardiology for TEE  Nephrology  Procedures:   TEE positive for pulmonic valve vegetation  Antimicrobials:  Vancomycin  Subjective: He is alert, he is feeling better, feels ready to go home.  Plan for TEE today  Objective: Vitals:   01/22/20 1437 01/22/20 1449 01/22/20 1456 01/22/20 1526  BP: (!) 100/53 (!) 110/56 140/78 137/88  Pulse: 98 95 95 93  Resp: 18 (!) 30 (!) 27 18  Temp: 98.4 F (36.9 C)  98 F (36.7 C)  TempSrc: Oral   Oral  SpO2: 95% 96% 95% 96%  Weight:      Height:        Intake/Output Summary (Last 24 hours) at 01/22/2020 1606 Last data filed at 01/22/2020 0930 Gross per 24 hour  Intake 200 ml  Output 0 ml  Net 200 ml   Filed Weights   01/20/20 2048 01/21/20 1115 01/21/20 1705  Weight: 131.8 kg 130.9 kg 130 kg    Examination:  General exam: Appears calm and comfortable  Respiratory system: Clear to  auscultation. Respiratory effort normal. Cardiovascular system: S1 & S2 heard, RRR. No JVD, murmurs, rubs, gallops or clicks. No pedal edema. Gastrointestinal system: Abdomen is nondistended, soft and nontender. No organomegaly or masses felt. Normal bowel sounds heard. Central nervous system: Alert and oriented.  Extremities: Trace edema   Data Reviewed: I have personally reviewed following labs and imaging studies  CBC: Recent Labs  Lab 01/20/20 0956 01/21/20 0808 01/22/20 0729  WBC 18.1* 17.2* 15.1*  NEUTROABS 13.4*  --  11.0*  HGB 9.9* 9.5* 9.5*  HCT 30.9* 29.3* 29.8*  MCV 88.0 87.2 88.7  PLT 44* 52* 70*   Basic Metabolic Panel: Recent Labs  Lab 01/20/20 0956 01/20/20 1935 01/21/20 0808 01/22/20 0729  NA 128*  --  130* 130*  K 3.0*  --  3.5 3.4*  CL 89*  --  91* 90*  CO2 18*  --  19* 19*  GLUCOSE 141*  --  129* 128*  BUN 78*  --  77* 77*  CREATININE 16.98*  --  16.52* 16.30*  CALCIUM 7.7*  --  7.7* 8.0*  MG  --  2.0  --   --   PHOS  --   --   --  6.0*   GFR: Estimated Creatinine Clearance: 7.1 mL/min (A) (by C-G formula based on SCr of 16.3 mg/dL (H)). Liver Function Tests: Recent Labs  Lab 01/20/20 0956 01/21/20 0808 01/22/20 0729  AST 28 24  --   ALT 44 42  --   ALKPHOS 99 95  --   BILITOT 0.5 0.5  --   PROT 6.7 6.6  --   ALBUMIN 2.4* 2.5* 2.7*   No results for input(s): LIPASE, AMYLASE in the last 168 hours. No results for input(s): AMMONIA in the last 168 hours. Coagulation Profile: No results for input(s): INR, PROTIME in the last 168 hours. Cardiac Enzymes: No results for input(s): CKTOTAL, CKMB, CKMBINDEX, TROPONINI in the last 168 hours. BNP (last 3 results) No results for input(s): PROBNP in the last 8760 hours. HbA1C: No results for input(s): HGBA1C in the last 72 hours. CBG: No results for input(s): GLUCAP in the last 168 hours. Lipid Profile: No results for input(s): CHOL, HDL, LDLCALC, TRIG, CHOLHDL, LDLDIRECT in the last 72  hours. Thyroid Function Tests: No results for input(s): TSH, T4TOTAL, FREET4, T3FREE, THYROIDAB in the last 72 hours. Anemia Panel: No results for input(s): VITAMINB12, FOLATE, FERRITIN, TIBC, IRON, RETICCTPCT in the last 72 hours. Sepsis Labs: Recent Labs  Lab 01/20/20 0956 01/20/20 1931 01/21/20 0808  LATICACIDVEN 1.3 2.3* 1.1    Recent Results (from the past 240 hour(s))  Blood culture (routine x 2)     Status: Abnormal (Preliminary result)   Collection Time: 01/20/20  1:07 PM   Specimen: BLOOD LEFT HAND  Result Value Ref Range Status   Specimen Description BLOOD LEFT HAND  Final   Special Requests   Final    BOTTLES  DRAWN AEROBIC ONLY Blood Culture results may not be optimal due to an inadequate volume of blood received in culture bottles   Culture  Setup Time   Final    GRAM POSITIVE COCCI IN CLUSTERS AEROBIC BOTTLE ONLY CRITICAL RESULT CALLED TO, READ BACK BY AND VERIFIED WITH: Floris PHARMD @1804  01/21/20 EB    Culture (A)  Final    STAPHYLOCOCCUS LUGDUNENSIS SUSCEPTIBILITIES TO FOLLOW Performed at [facility], [address], [place], [place] [address]    Report Status PENDING  Incomplete  Blood Culture ID Panel (Reflexed)     Status: Abnormal   Collection Time: 01/20/20  1:07 PM  Result Value Ref Range Status   Enterococcus faecalis NOT DETECTED NOT DETECTED Final   Enterococcus Faecium NOT DETECTED NOT DETECTED Final   Listeria monocytogenes NOT DETECTED NOT DETECTED Final   Staphylococcus species DETECTED (A) NOT DETECTED Final    Comment: CRITICAL RESULT CALLED TO, READ BACK BY AND VERIFIED WITH: S,MILLS PHARMD @1804  01/21/20 EB    Staphylococcus aureus (BCID) NOT DETECTED NOT DETECTED Final   Staphylococcus epidermidis NOT DETECTED NOT DETECTED Final   Staphylococcus lugdunensis DETECTED (A) NOT DETECTED Final    Comment: CRITICAL RESULT CALLED TO, READ BACK BY AND VERIFIED WITH: S,MILLS PHARMD @1804  01/21/20 EB    Streptococcus species NOT DETECTED  NOT DETECTED Final   Streptococcus agalactiae NOT DETECTED NOT DETECTED Final   Streptococcus pneumoniae NOT DETECTED NOT DETECTED Final   Streptococcus pyogenes NOT DETECTED NOT DETECTED Final   A.calcoaceticus-baumannii NOT DETECTED NOT DETECTED Final   Bacteroides fragilis NOT DETECTED NOT DETECTED Final   Enterobacterales NOT DETECTED NOT DETECTED Final   Enterobacter cloacae complex NOT DETECTED NOT DETECTED Final   Escherichia coli NOT DETECTED NOT DETECTED Final   Klebsiella aerogenes NOT DETECTED NOT DETECTED Final   Klebsiella oxytoca NOT DETECTED NOT DETECTED Final   Klebsiella pneumoniae NOT DETECTED NOT DETECTED Final   Proteus species NOT DETECTED NOT DETECTED Final   Salmonella species NOT DETECTED NOT DETECTED Final   Serratia marcescens NOT DETECTED NOT DETECTED Final   Haemophilus influenzae NOT DETECTED NOT DETECTED Final   Neisseria meningitidis NOT DETECTED NOT DETECTED Final   Pseudomonas aeruginosa NOT DETECTED NOT DETECTED Final   Stenotrophomonas maltophilia NOT DETECTED NOT DETECTED Final   Candida albicans NOT DETECTED NOT DETECTED Final   Candida auris NOT DETECTED NOT DETECTED Final   Candida glabrata NOT DETECTED NOT DETECTED Final   Candida krusei NOT DETECTED NOT DETECTED Final   Candida parapsilosis NOT DETECTED NOT DETECTED Final   Candida tropicalis NOT DETECTED NOT DETECTED Final   Cryptococcus neoformans/gattii NOT DETECTED NOT DETECTED Final   Methicillin resistance mecA/C NOT DETECTED NOT DETECTED Final    Comment: Performed at [facility], [address], [place], [place] [address]  SARS Coronavirus 2 by RT PCR (hospital order, performed in [facility] hospital lab) Nasopharyngeal Nasopharyngeal Swab     Status: None   Collection Time: 01/20/20  1:12 PM   Specimen: Nasopharyngeal Swab  Result Value Ref Range Status   SARS Coronavirus 2 NEGATIVE NEGATIVE Final    Comment: (NOTE) SARS-CoV-2 target nucleic acids are NOT DETECTED.  The  SARS-CoV-2 RNA is generally detectable in upper and lower respiratory specimens during the acute phase of infection. The lowest concentration of SARS-CoV-2 viral copies this assay can detect is 250 copies / mL. A negative result does not preclude SARS-CoV-2 infection and should not be used as the sole basis  for treatment or other patient management decisions.  A negative result may occur with improper specimen collection / handling, submission of specimen other than nasopharyngeal swab, presence of viral mutation(s) within the areas targeted by this assay, and inadequate number of viral copies (<250 copies / mL). A negative result must be combined with clinical observations, patient history, and epidemiological information.  Fact Sheet for Patients:   StrictlyIdeas.no  Fact Sheet for Healthcare Providers: BankingDealers.co.za  This test is not yet approved or  cleared by the [place] FDA and has been authorized for detection and/or diagnosis of SARS-CoV-2 by FDA under an Emergency Use Authorization (EUA).  This EUA will remain in effect (meaning this test can be used) for the duration of the COVID-19 declaration under Section 564(b)(1) of the Act, 21 U.S.C. section 360bbb-3(b)(1), unless the authorization is terminated or revoked sooner.  Performed at [facility], [place] [address] [place], [place] [address]   Body fluid culture     Status: None (Preliminary result)   Collection Time: 01/20/20  6:15 PM   Specimen: Peritoneal Washings; Body Fluid  Result Value Ref Range Status   Specimen Description PERITONEAL FLUID  Final   Special Requests PERITONEAL DIALYSIS  Final   Gram Stain   Final    WBC PRESENT,BOTH PMN AND MONONUCLEAR NO ORGANISMS SEEN CYTOSPIN SMEAR    Culture   Final    NO GROWTH 2 DAYS Performed at [facility], [address], [place], [place] [address]    Report Status PENDING  Incomplete  Blood  culture (routine x 2)     Status: Abnormal (Preliminary result)   Collection Time: 01/20/20  7:31 PM   Specimen: BLOOD LEFT HAND  Result Value Ref Range Status   Specimen Description BLOOD LEFT HAND  Final   Special Requests   Final    BOTTLES DRAWN AEROBIC ONLY Blood Culture results may not be optimal due to an inadequate volume of blood received in culture bottles   Culture  Setup Time   Final    AEROBIC BOTTLE ONLY GRAM POSITIVE COCCI IN CLUSTERS CRITICAL RESULT CALLED TO, READ BACK BY AND VERIFIED WITH: Shellee Milo [facility] 01/22/20 1497 JDW Performed at [facility], [place] [address] [place], [place] [address]    Culture STAPHYLOCOCCUS LUGDUNENSIS (A)  Final   Report Status PENDING  Incomplete         Radiology Studies: IR Removal Tun Cv Cath W/O FL  Result Date: 01/21/2020 INDICATION: Patient history of ESRD on HD via right IJ tunneled HD catheter placed in OR 10/2017. Patient now tolerating PD without difficulties. Request is made for removal of tunneled HD catheter. EXAM: REMOVAL OF TUNNELED HEMODIALYSIS CATHETER MEDICATIONS: 15 mL 1% lidocaine COMPLICATIONS: None immediate. PROCEDURE: Informed written consent was obtained from the patient following an explanation of the procedure, risks, benefits and alternatives to treatment. A time out was performed prior to the initiation of the procedure. Maximal barrier sterile technique was utilized including caps, mask, sterile gowns, sterile gloves, large sterile drape, hand hygiene, and Hibiclens. 1% lidocaine was injected under sterile conditions along the subcutaneous tunnel. Utilizing a combination of blunt dissection and gentle traction, the catheter was removed intact. Hemostasis was obtained with manual compression. A dressing was placed. The patient tolerated the procedure well without immediate post procedural complication. IMPRESSION: Successful removal of tunneled dialysis catheter. Read by: Earley Abide, PA-C Electronically Signed    By: Corrie Mckusick D.O.   On: 01/21/2020 16:28  ECHO TEE  Result Date: 01/22/2020    TRANSESOPHOGEAL ECHO REPORT   Patient Name:   Adrian Frye Date of Exam: 01/22/2020 Medical Rec #:  [id number]             Height:       67.0 in Accession #:    [id number]            Weight:       286.6 lb Date of Birth:  1970/07/29             BSA:          2.356 m Patient Age:    15 years              BP:           162/89 mmHg Patient Gender: M                     HR:           95 bpm. Exam Location:  Inpatient Procedure: Transesophageal Echo, Color Doppler and Cardiac Doppler Indications:     Bacteremia 790.7 / R78.81  History:         Patient has prior history of Echocardiogram examinations. Risk                  Factors:Hypertension and Sleep Apnea. End stage renal diseae,                  Anemia, thrombocytopenia.  Sonographer:     Darlina Sicilian RDCS Referring Phys:  [id number] [place] Diagnosing Phys: Lyman Bishop MD  Sonographer Comments: Technically difficult study due to poor echo windows. Image acquisition challenging due to patient body habitus. PROCEDURE: The transesophogeal probe was passed without difficulty through the esophogus of the patient. Sedation performed by different physician. The patient was monitored while under deep sedation. Anesthestetic sedation was provided intravenously by Anesthesiology: 217mg  of Propofol. The patient developed no complications during the procedure. IMPRESSIONS  1. Left ventricular ejection fraction, by estimation, is 50 to 55%. The left ventricle has low normal function. There is mild left ventricular hypertrophy.  2. Right ventricular systolic function is normal. The right ventricular size is normal.  3. No left atrial/left atrial appendage thrombus was detected.  4. The mitral valve is grossly normal. Trivial mitral valve regurgitation.  5. The aortic valve is tricuspid. Aortic valve regurgitation is not visualized.  6. Large mobile 1 x 1.5 cm mass consistent with  vegetation. The pulmonic valve was abnormal. Pulmonic valve regurgitation is moderate. Conclusion(s)/Recommendation(s): Findings are concerning for vegetation/infective endocarditis as detailed above. FINDINGS  Left Ventricle: Left ventricular ejection fraction, by estimation, is 50 to 55%. The left ventricle has low normal function. The left ventricular internal cavity size was normal in size. There is mild left ventricular hypertrophy. Right Ventricle: The right ventricular size is normal. No increase in right ventricular wall thickness. Right ventricular systolic function is normal. Left Atrium: Left atrial size was normal in size. No left atrial/left atrial appendage thrombus was detected. Right Atrium: Right atrial size was normal in size. Pericardium: There is no evidence of pericardial effusion. Mitral Valve: The mitral valve is grossly normal. Trivial mitral valve regurgitation. Tricuspid Valve: The tricuspid valve is grossly normal. Tricuspid valve regurgitation is trivial. Aortic Valve: The aortic valve is tricuspid. Aortic valve regurgitation is not visualized. Pulmonic Valve: Large mobile 1 x 1.5 cm mass consistent with vegetation. The pulmonic valve was abnormal. Pulmonic valve  regurgitation is moderate. Aorta: The aortic root and ascending aorta are structurally normal, with no evidence of dilitation. IAS/Shunts: No atrial level shunt detected by color flow Doppler. Lyman Bishop MD Electronically signed by Lyman Bishop MD Signature Date/Time: 01/22/2020/3:16:54 PM    Final         Scheduled Meds: . amLODipine  2.5 mg Oral Daily  . calcitRIOL  0.5 mcg Oral Daily  . Chlorhexidine Gluconate Cloth  6 each Topical Daily  . ferric citrate  840 mg Oral TID WC  . gentamicin cream  1 application Topical Daily  . potassium chloride  20 mEq Oral Once  . senna-docusate  1 tablet Oral BID  . vancomycin variable dose per unstable renal function (pharmacist dosing)   Does not apply See admin  instructions   Continuous Infusions: . dialysis solution 1.5% low-MG/low-CA    . dialysis solution 1.5% low-MG/low-CA    . [START ON 01/23/2020] dialysis solution 1.5% low-MG/low-CA       LOS: 2 days    Time spent: 35 minutes    Aydrien Froman A Mallerie Blok, MD [facility]   If 7PM-7AM, please contact night-coverage [email or web address]  01/22/2020, 4:06 PM

## 2020-01-22 NOTE — Progress Notes (Signed)
[facility] Progress Note   Subjective:  TDC removed yesterday, planned for TEE today. Patient seen in room, feeling well with no SOB, CP, dizziness, abdominal pain, N/V/D. Over EDW yesterday and used two 2.5% dextrose exchanges. Pt feels excess weight was due to constipation and requests all 1.5% exchanges. Hgb also in the 9's, however patient declines aranesp and says he will get mircera at outpatient PD after he discharges.   Objective Vitals:   01/21/20 2108 01/22/20 0459 01/22/20 0905 01/22/20 0945  BP: (!) 148/71 110/78 137/75 134/83  Pulse: 96 95 90 96  Resp: 18 18 18 16   Temp: 99.2 F (37.3 C) 98.2 F (36.8 C) 98.5 F (36.9 C) 98.1 F (36.7 C)  TempSrc: Oral   Oral  SpO2: 98% 94% 96% 96%  Weight:      Height:       Physical Exam General: Well developed male, alert and in NAD Heart: RRR, no murmurs, rubs or gallops Lungs: CTA bilaterally without wheezing, rhonchi or rales Abdomen: PD catheter in place, no surrounding erythema. Abdomen soft, non-tender, non-distended, +BS Extremities: No edema bilateral lower extremities  Additional Objective Labs: Basic Metabolic Panel: Recent Labs  Lab 01/20/20 0956 01/21/20 0808 01/22/20 0729  NA 128* 130* 130*  K 3.0* 3.5 3.4*  CL 89* 91* 90*  CO2 18* 19* 19*  GLUCOSE 141* 129* 128*  BUN 78* 77* 77*  CREATININE 16.98* 16.52* 16.30*  CALCIUM 7.7* 7.7* 8.0*  PHOS  --   --  6.0*   Liver Function Tests: Recent Labs  Lab 01/20/20 0956 01/21/20 0808 01/22/20 0729  AST 28 24  --   ALT 44 42  --   ALKPHOS 99 95  --   BILITOT 0.5 0.5  --   PROT 6.7 6.6  --   ALBUMIN 2.4* 2.5* 2.7*   CBC: Recent Labs  Lab 01/20/20 0956 01/21/20 0808 01/22/20 0729  WBC 18.1* 17.2* 15.1*  NEUTROABS 13.4*  --  11.0*  HGB 9.9* 9.5* 9.5*  HCT 30.9* 29.3* 29.8*  MCV 88.0 87.2 88.7  PLT 44* 52* 70*   Blood Culture    Component Value Date/Time   SDES BLOOD LEFT HAND 01/20/2020 1931   SPECREQUEST  01/20/2020 1931     BOTTLES DRAWN AEROBIC ONLY Blood Culture results may not be optimal due to an inadequate volume of blood received in culture bottles   CULT GRAM POSITIVE COCCI 01/20/2020 1931   REPTSTATUS PENDING 01/20/2020 1931    Studies/Results: IR Removal Tun Cv Cath W/O FL  Result Date: 01/21/2020 INDICATION: Patient history of ESRD on HD via right IJ tunneled HD catheter placed in OR 10/2017. Patient now tolerating PD without difficulties. Request is made for removal of tunneled HD catheter. EXAM: REMOVAL OF TUNNELED HEMODIALYSIS CATHETER MEDICATIONS: 15 mL 1% lidocaine COMPLICATIONS: None immediate. PROCEDURE: Informed written consent was obtained from the patient following an explanation of the procedure, risks, benefits and alternatives to treatment. A time out was performed prior to the initiation of the procedure. Maximal barrier sterile technique was utilized including caps, mask, sterile gowns, sterile gloves, large sterile drape, hand hygiene, and Hibiclens. 1% lidocaine was injected under sterile conditions along the subcutaneous tunnel. Utilizing a combination of blunt dissection and gentle traction, the catheter was removed intact. Hemostasis was obtained with manual compression. A dressing was placed. The patient tolerated the procedure well without immediate post procedural complication. IMPRESSION: Successful removal of tunneled dialysis catheter. Read by: Earley Abide, PA-C Electronically Signed  By: Corrie Mckusick D.O.   On: 01/21/2020 16:28   ECHOCARDIOGRAM COMPLETE  Result Date: 01/20/2020    ECHOCARDIOGRAM REPORT   Patient Name:   Adrian Frye Date of Exam: 01/20/2020 Medical Rec #:  [id number]             Height:       67.0 in Accession #:    [id number]            Weight:       274.0 lb Date of Birth:  [birth year]             BSA:          2.312 m Patient Age:    49 years              BP:           112/61 mmHg Patient Gender: M                     HR:           90 bpm. Exam Location:   Inpatient Procedure: 2D Echo and Intracardiac Opacification Agent Indications:    Bacteremia  History:        Patient has prior history of Echocardiogram examinations, most                 recent 06/19/2013. Risk Factors:Hypertension and Dyslipidemia.  Sonographer:    Mikki Santee RDCS (AE) Referring Phys: [id number] Michell Heinrich PAHWANI IMPRESSIONS  1. Abnormal septal motion . Left ventricular ejection fraction, by estimation, is 50 to 55%. The left ventricle has low normal function. The left ventricle has no regional wall motion abnormalities. There is mild left ventricular hypertrophy. Left ventricular diastolic parameters were normal.  2. Right ventricular systolic function is normal. The right ventricular size is normal.  3. The mitral valve is normal in structure. No evidence of mitral valve regurgitation. No evidence of mitral stenosis.  4. The aortic valve is normal in structure. Aortic valve regurgitation is not visualized. No aortic stenosis is present.  5. The inferior vena cava is normal in size with greater than 50% respiratory variability, suggesting right atrial pressure of 3 mmHg. FINDINGS  Left Ventricle: Abnormal septal motion. Left ventricular ejection fraction, by estimation, is 50 to 55%. The left ventricle has low normal function. The left ventricle has no regional wall motion abnormalities. Definity contrast agent was given IV to delineate the left ventricular endocardial borders. The left ventricular internal cavity size was normal in size. There is mild left ventricular hypertrophy. Left ventricular diastolic parameters were normal. Right Ventricle: The right ventricular size is normal. No increase in right ventricular wall thickness. Right ventricular systolic function is normal. Left Atrium: Left atrial size was normal in size. Right Atrium: Right atrial size was normal in size. Pericardium: There is no evidence of pericardial effusion. Mitral Valve: The mitral valve is normal in structure.  Normal mobility of the mitral valve leaflets. No evidence of mitral valve regurgitation. No evidence of mitral valve stenosis. Tricuspid Valve: The tricuspid valve is normal in structure. Tricuspid valve regurgitation is trivial. No evidence of tricuspid stenosis. Aortic Valve: The aortic valve is normal in structure. Aortic valve regurgitation is not visualized. No aortic stenosis is present. Pulmonic Valve: The pulmonic valve was not well visualized. Pulmonic valve regurgitation is not visualized. No evidence of pulmonic stenosis. Aorta: The aortic root is normal in size and structure. Venous: The inferior vena cava is normal in  size with greater than 50% respiratory variability, suggesting right atrial pressure of 3 mmHg. IAS/Shunts: No atrial level shunt detected by color flow Doppler.  LEFT VENTRICLE PLAX 2D LVIDd:         4.40 cm  Diastology LVIDs:         3.00 cm  LV e' lateral:   11.50 cm/s LV PW:         1.30 cm  LV E/e' lateral: 6.5 LV IVS:        1.30 cm  LV e' medial:    10.20 cm/s LVOT diam:     2.50 cm  LV E/e' medial:  7.3 LV SV:         86 LV SV Index:   37 LVOT Area:     4.91 cm  RIGHT VENTRICLE RV S prime:     13.30 cm/s TAPSE (M-mode): 2.1 cm LEFT ATRIUM             Index       RIGHT ATRIUM           Index LA diam:        3.60 cm 1.56 cm/m  RA Area:     17.00 cm LA Vol (A2C):   41.1 ml 17.78 ml/m RA Volume:   46.00 ml  19.90 ml/m LA Vol (A4C):   34.6 ml 14.97 ml/m LA Biplane Vol: 38.2 ml 16.52 ml/m  AORTIC VALVE LVOT Vmax:   94.50 cm/s LVOT Vmean:  64.400 cm/s LVOT VTI:    0.176 m  AORTA Ao Root diam: 3.70 cm MITRAL VALVE MV Area (PHT): 4.86 cm    SHUNTS MV Decel Time: 156 msec    Systemic VTI:  0.18 m MV E velocity: 74.90 cm/s  Systemic Diam: 2.50 cm MV A velocity: 67.30 cm/s MV E/A ratio:  1.11 Jenkins Rouge MD Electronically signed by Jenkins Rouge MD Signature Date/Time: 01/20/2020/3:47:50 PM    Final    Medications: . dialysis solution 1.5% low-MG/low-CA    . dialysis solution 1.5%  low-MG/low-CA    . dialysis solution 2.5% low-MG/low-CA     . amLODipine  2.5 mg Oral Daily  . calcitRIOL  0.5 mcg Oral Daily  . Chlorhexidine Gluconate Cloth  6 each Topical Daily  . ferric citrate  840 mg Oral TID WC  . gentamicin cream  1 application Topical Daily  . senna-docusate  1 tablet Oral BID  . vancomycin variable dose per unstable renal function (pharmacist dosing)   Does not apply See admin instructions    Dialysis Orders: CCPD 7x week 6 exchanges, all 1.5% dextrose, fill volume 3000 ml Day dwell 1.5% dextrose, 2000 mL EDW 128kg  01/17/20 labs Hgb 10.8, tsat 25%, WBC 13.07, Plt 82, Na 132, K+ 3.5, Bicarbonate 20, Alb 3.7, Ca 8.0, Phos 6.4  Assessment/Plan: 1. Bacteremia: Outpatient blood cultures growing staph. Patient started on IV vancomycin. Suspect TDC is likely source of infection given recent drainage from exit site (no longer in use for dialysis but patient left in place for blood draws). Dr. Joelyn Oms, patient's outpatient nephrologist, agrees with recommendation to remove [employer] and PD clinic confirms they have an RN who has successfully drawn blood peripherally and can continue to do so. Seen by ID who recommend TDC removal and TEE. Patient is agreeable with this plan. [place] removed 01/21/20 and planned for TEE today.  2. ESRD:On PD, continue 6 exchanges overnight (3L) and daytime dwell (2L) with all 1.5% dextrose. Outpatient clearances not to goal but patient  does not want to return to HD, hoping for eventual transplant after weight loss.  3. Hypokalemia: K+ 3.0 on admission, Given 16mEq KCl 9/6 and improved to K+ 3.5, now drifted down to 3.4, will order PO KCl 14mEq x 1. Continue daily BMP and replete as needed. 4. Hypertension/volume:BP controlled, does not appear volume overloaded. Continue all 1.5% exchanges. Hyponatremia noted with Na 128- improved to 130 today. Continue home meds (amlodipine). 5. Anemia:Hgb 9.5. Patient declines aranesp. Resume mircera at  discharge.  6. Metabolic bone disease:Corrected calcium 9.0. Continue calcitriol 0.72mcg daily and auryxia 4 tabs TID with meals. 7. Thrombocytopenia:Plt trending up, 70 today, anticoagulation on hold. Per primary.   Anice Paganini, PA-C 01/22/2020, 10:33 AM  [facility] Pager: [phone]

## 2020-01-22 NOTE — Interval H&P Note (Signed)
History and Physical Interval Note:  01/22/2020 1:19 PM  Adrian Frye  has presented today for surgery, with the diagnosis of BACTEREMIA.  The various methods of treatment have been discussed with the patient and family. After consideration of risks, benefits and other options for treatment, the patient has consented to  Procedure(s): TRANSESOPHAGEAL ECHOCARDIOGRAM (TEE) (N/A) as a surgical intervention.  The patient's history has been reviewed, patient examined, no change in status, stable for surgery.  I have reviewed the patient's chart and labs.  Questions were answered to the patient's satisfaction.     Pixie Casino

## 2020-01-22 NOTE — Transfer of Care (Signed)
Immediate Anesthesia Transfer of Care Note  Patient: Adrian Frye  Procedure(s) Performed: TRANSESOPHAGEAL ECHOCARDIOGRAM (TEE) (N/A )  Patient Location: Endoscopy Unit  Anesthesia Type:MAC  Level of Consciousness: awake, alert  and oriented  Airway & Oxygen Therapy: Patient Spontanous Breathing and Patient connected to nasal cannula oxygen  Post-op Assessment: Report given to RN and Post -op Vital signs reviewed and stable  Post vital signs: Reviewed and stable  Last Vitals:  Vitals Value Taken Time  BP 100/53 01/22/20 1437  Temp    Pulse 96 01/22/20 1438  Resp 17 01/22/20 1438  SpO2 95 % 01/22/20 1438  Vitals shown include unvalidated device data.  Last Pain:  Vitals:   01/22/20 1255  TempSrc: Oral  PainSc: 0-No pain         Complications: No complications documented.

## 2020-01-22 NOTE — Progress Notes (Signed)
  Echocardiogram Echocardiogram Transesophageal has been performed.  Adrian Frye M 01/22/2020, 2:40 PM

## 2020-01-22 NOTE — Anesthesia Preprocedure Evaluation (Addendum)
Anesthesia Evaluation  Patient identified by MRN, date of birth, ID band Patient awake    Reviewed: Allergy & Precautions, H&P , NPO status , Patient's Chart, lab work & pertinent test results, reviewed documented beta blocker date and time   Airway Mallampati: II  TM Distance: >3 FB Neck ROM: full    Dental  (+) Teeth Intact, Dental Advisory Given   Pulmonary asthma , sleep apnea , former smoker,    Pulmonary exam normal breath sounds clear to auscultation       Cardiovascular Exercise Tolerance: Good hypertension, +CHF   Rhythm:regular Rate:Normal  Echo 01/2020 1. Abnormal septal motion . Left ventricular ejection fraction, by estimation, is 50 to 55%. The left ventricle has low normal function. The left ventricle has no regional wall motion abnormalities. There is mild left ventricular hypertrophy. Left ventricular diastolic parameters were normal.  2. Right ventricular systolic function is normal. The right ventricular size is normal.  3. The mitral valve is normal in structure. No evidence of mitral valve regurgitation. No evidence of mitral stenosis.  4. The aortic valve is normal in structure. Aortic valve regurgitation is not visualized. No aortic stenosis is present.  5. The inferior vena cava is normal in size with greater than 50% respiratory variability, suggesting right atrial pressure of 3 mmHg   Echo 2/15 Study Conclusions  - Left ventricle: The cavity size was normal. There was moderate concentric hypertrophy. Systolic function was vigorous. The estimated ejection fraction was in the range of 65% to 70%. Wall motion was normal; there were no regional wall motion abnormalities. Features are consistent with a pseudonormal left ventricular filling pattern, with concomitant abnormal relaxation and increased filling pressure (grade 2 diastolic dysfunction). - Left atrium: The atrium was mildly  dilated. Impressions:  EKG 6/19 Sinus rhythm Incomplete left bundle branch block Low voltage, precordial leads ST elev, probable normal early repol pattern Baseline wander in lead(s) V4 V5 V6 similar to prior 2/18   Neuro/Psych negative psych ROS   GI/Hepatic negative GI ROS, Neg liver ROS,   Endo/Other  diabetesMorbid obesity  Renal/GU Renal disease  negative genitourinary   Musculoskeletal   Abdominal (+) + obese,   Peds  Hematology  (+) Blood dyscrasia, anemia ,   Anesthesia Other Findings   Reproductive/Obstetrics negative OB ROS                            Anesthesia Physical Anesthesia Plan  ASA: III  Anesthesia Plan: MAC   Post-op Pain Management:    Induction: Intravenous  PONV Risk Score and Plan: 1 and Propofol infusion, Treatment may vary due to age or medical condition and TIVA  Airway Management Planned: Natural Airway  Additional Equipment: None  Intra-op Plan:   Post-operative Plan:   Informed Consent: I have reviewed the patients History and Physical, chart, labs and discussed the procedure including the risks, benefits and alternatives for the proposed anesthesia with the patient or authorized representative who has indicated his/her understanding and acceptance.     Dental advisory given  Plan Discussed with: CRNA  Anesthesia Plan Comments:        Anesthesia Quick Evaluation

## 2020-01-23 ENCOUNTER — Encounter (HOSPITAL_COMMUNITY): Payer: Self-pay | Admitting: Internal Medicine

## 2020-01-23 DIAGNOSIS — Z992 Dependence on renal dialysis: Secondary | ICD-10-CM | POA: Diagnosis not present

## 2020-01-23 DIAGNOSIS — N2581 Secondary hyperparathyroidism of renal origin: Secondary | ICD-10-CM | POA: Diagnosis not present

## 2020-01-23 DIAGNOSIS — N186 End stage renal disease: Secondary | ICD-10-CM | POA: Diagnosis not present

## 2020-01-23 LAB — CULTURE, BLOOD (ROUTINE X 2)

## 2020-01-23 LAB — CBC WITH DIFFERENTIAL/PLATELET
Abs Immature Granulocytes: 0.63 10*3/uL — ABNORMAL HIGH (ref 0.00–0.07)
Basophils Absolute: 0.1 10*3/uL (ref 0.0–0.1)
Basophils Relative: 1 %
Eosinophils Absolute: 0.2 10*3/uL (ref 0.0–0.5)
Eosinophils Relative: 1 %
HCT: 28.9 % — ABNORMAL LOW (ref 39.0–52.0)
Hemoglobin: 9.4 g/dL — ABNORMAL LOW (ref 13.0–17.0)
Immature Granulocytes: 4 %
Lymphocytes Relative: 12 %
Lymphs Abs: 1.8 10*3/uL (ref 0.7–4.0)
MCH: 29 pg (ref 26.0–34.0)
MCHC: 32.5 g/dL (ref 30.0–36.0)
MCV: 89.2 fL (ref 80.0–100.0)
Monocytes Absolute: 1.3 10*3/uL — ABNORMAL HIGH (ref 0.1–1.0)
Monocytes Relative: 8 %
Neutro Abs: 11.2 10*3/uL — ABNORMAL HIGH (ref 1.7–7.7)
Neutrophils Relative %: 74 %
Platelets: 81 10*3/uL — ABNORMAL LOW (ref 150–400)
RBC: 3.24 MIL/uL — ABNORMAL LOW (ref 4.22–5.81)
RDW: 18.3 % — ABNORMAL HIGH (ref 11.5–15.5)
WBC: 15.1 10*3/uL — ABNORMAL HIGH (ref 4.0–10.5)
nRBC: 0 % (ref 0.0–0.2)

## 2020-01-23 LAB — RENAL FUNCTION PANEL
Albumin: 2.5 g/dL — ABNORMAL LOW (ref 3.5–5.0)
Anion gap: 20 — ABNORMAL HIGH (ref 5–15)
BUN: 79 mg/dL — ABNORMAL HIGH (ref 6–20)
CO2: 20 mmol/L — ABNORMAL LOW (ref 22–32)
Calcium: 8.2 mg/dL — ABNORMAL LOW (ref 8.9–10.3)
Chloride: 90 mmol/L — ABNORMAL LOW (ref 98–111)
Creatinine, Ser: 16.14 mg/dL — ABNORMAL HIGH (ref 0.61–1.24)
GFR calc Af Amer: 4 mL/min — ABNORMAL LOW (ref >60)
GFR calc non Af Amer: 3 mL/min — ABNORMAL LOW (ref >60)
Glucose, Bld: 121 mg/dL — ABNORMAL HIGH (ref 70–99)
Phosphorus: 6.9 mg/dL — ABNORMAL HIGH (ref 2.5–4.6)
Potassium: 3.2 mmol/L — ABNORMAL LOW (ref 3.5–5.1)
Sodium: 130 mmol/L — ABNORMAL LOW (ref 135–145)

## 2020-01-23 MED ORDER — CEFAZOLIN SODIUM-DEXTROSE 1-4 GM/50ML-% IV SOLN
1.0000 g | Freq: Every day | INTRAVENOUS | Status: DC
  Administered 2020-01-24: 1 g via INTRAVENOUS
  Filled 2020-01-23: qty 50

## 2020-01-23 MED ORDER — DELFLEX-LC/1.5% DEXTROSE 344 MOSM/L IP SOLN: INTRAPERITONEAL | Status: AC

## 2020-01-23 MED ORDER — POTASSIUM CHLORIDE CRYS ER 20 MEQ PO TBCR
40.0000 meq | EXTENDED_RELEASE_TABLET | Freq: Once | ORAL | Status: AC
  Administered 2020-01-23: 40 meq via ORAL
  Filled 2020-01-23: qty 2

## 2020-01-23 MED ORDER — DELFLEX-LC/1.5% DEXTROSE 344 MOSM/L IP SOLN: INTRAPERITONEAL | Status: DC

## 2020-01-23 MED ORDER — GENTAMICIN SULFATE 0.1 % EX CREA
1.0000 "application " | TOPICAL_CREAM | Freq: Every day | CUTANEOUS | Status: DC
  Filled 2020-01-23: qty 15

## 2020-01-23 NOTE — Progress Notes (Signed)
Patient able to place himself on home CPAP unit for th night.

## 2020-01-23 NOTE — Progress Notes (Signed)
Patient refused pink restricted extremity armband on left arm where fistula is. States " I won't let anybody use this arm."

## 2020-01-23 NOTE — Progress Notes (Signed)
PROGRESS NOTE    Adrian Frye  DXI:[id number] DOB: 1970/12/24 DOA: 01/20/2020 PCP: Girtha Rm, NP-C   Brief Narrative: 49 year old with past medical history significant for ESRD on PD, hypertension, morbid obesity, sleep apnea, TDC for blood draws, presented to the ED not feeling well and positive blood cultures.  Patient report feeling unwell for 5 days prior to admission, report fever, nausea and chills. His blood cultures came back positive for staph aureus, therefore patient was recommended to present to the ER for further evaluation and management.  Noticed some drainage from [facility].   -Patient admitted with staph Aureus  bacteremia, secondary to [facility] infection.  He underwent removal of right IJ tunnel hemodialysis catheter on 9 11/2019.  -Patient underwent TEE 9/8; pulmonic valve: There was a suggestion of mobile density on TEE that was confirmed with surface imaging, measuring 1x1,5 centimeter consistent with vegetation.  There is associated moderate regurgitation.  There is not a stenosis.  Assessment & Plan:   Principal Problem:   Bacteremia due to Gram-positive bacteria Active Problems:   Morbid obesity ([place])   Hypertension   ESRD on peritoneal dialysis ([place])   Hypokalemia   Hyponatremia   Thrombocytopenia ([facility])   Anemia of chronic disease   Bacteremia  1-Staph  Bacteremia, Pulmonic valve endocarditis -Blood cultures from 9/06 grew staph lugdunensis. Pan sensitive. Similar culture result form out patient HD per ID notes.  -Okay for central line placement on Friday if repeated blood cultures from 9/08  remain negative per ID recommendation. -He will need 6 weeks of IV antibiotic Ancef -Removal of right IJ tunnel hemodialysis catheter on 9 11/2019.  -Blood culture 9/08: No growth to date.   2-Sepsis secondary to staph bacteremia in the setting of TDC; Presented with leukocytosis, tachycardia, Tachypnea, lactic acidosis. sourice of infection TDC infection, bacteremia.   Continue with IV antibiotics Sepsis present on admission.   ESRD on PD, anion gap metabolic acidosis Management per nephrologist  Hypokalemia: Management per nephrology kcl 40 meq times one.   Hypertension: Continue with Norvasc  Anemia: hb stable at 9.4  Thrombocytopenia acute on chronic: improved.  Obstructive sleep apnea: Continue with CPAP\ Morbid obesity will need lifestyle modifications   Estimated body mass index is 44.89 kg/m as calculated from the following:   Height as of this encounter: 5\' 7"  (1.702 m).   Weight as of this encounter: 130 kg.   DVT prophylaxis: SCDs due to thrombocytopenia if continues to improve will consider heparin Code Status: Full code Family Communication: Care discussed with patient Disposition Plan:  Status is: Inpatient  Remains inpatient appropriate because:IV treatments appropriate due to intensity of illness or inability to take PO   Dispo: The patient is from: Home              Anticipated d/c is to: Home              Anticipated d/c date is: 2 days              Patient currently is not medically stable to d/c.DC 9/10 if blood culture remain negative and get central line placement.         Consultants:   ID  Cardiology for TEE  Nephrology  Procedures:   TEE positive for pulmonic valve vegetation  Antimicrobials:  Vancomycin  Subjective: He denies abdominal pain, dyspnea. He has been eating well.   Objective: Vitals:   01/22/20 1646 01/22/20 2057 01/23/20 0457 01/23/20 0945  BP: 127/85 116/64 130/68 (!) 145/88  Pulse: 96 99 94 97  Resp: 18 18 16 16   Temp: 98.6 F (37 C) 99.1 F (37.3 C) 98.8 F (37.1 C) 98.7 F (37.1 C)  TempSrc: Oral  Oral Oral  SpO2: 98% 96% 92% 98%  Weight:      Height:        Intake/Output Summary (Last 24 hours) at 01/23/2020 1412 Last data filed at 01/23/2020 1023 Gross per 24 hour  Intake 400 ml  Output 0 ml  Net 400 ml   Filed Weights   01/20/20 2048 01/21/20 1115  01/21/20 1705  Weight: 131.8 kg 130.9 kg 130 kg    Examination:  General exam: NAD Respiratory system: CTA Cardiovascular system: S 1, S 2 RRR. Gastrointestinal system: BS present, soft, nt Central nervous system: alert Extremities: trace edema   Data Reviewed: I have personally reviewed following labs and imaging studies  CBC: Recent Labs  Lab 01/20/20 0956 01/21/20 0808 01/22/20 0729 01/23/20 0407  WBC 18.1* 17.2* 15.1* 15.1*  NEUTROABS 13.4*  --  11.0* 11.2*  HGB 9.9* 9.5* 9.5* 9.4*  HCT 30.9* 29.3* 29.8* 28.9*  MCV 88.0 87.2 88.7 89.2  PLT 44* 52* 70* 81*   Basic Metabolic Panel: Recent Labs  Lab 01/20/20 0956 01/20/20 1935 01/21/20 0808 01/22/20 0729 01/23/20 0407  NA 128*  --  130* 130* 130*  K 3.0*  --  3.5 3.4* 3.2*  CL 89*  --  91* 90* 90*  CO2 18*  --  19* 19* 20*  GLUCOSE 141*  --  129* 128* 121*  BUN 78*  --  77* 77* 79*  CREATININE 16.98*  --  16.52* 16.30* 16.14*  CALCIUM 7.7*  --  7.7* 8.0* 8.2*  MG  --  2.0  --   --   --   PHOS  --   --   --  6.0* 6.9*   GFR: Estimated Creatinine Clearance: 7.2 mL/min (A) (by C-G formula based on SCr of 16.14 mg/dL (H)). Liver Function Tests: Recent Labs  Lab 01/20/20 0956 01/21/20 0808 01/22/20 0729 01/23/20 0407  AST 28 24  --   --   ALT 44 42  --   --   ALKPHOS 99 95  --   --   BILITOT 0.5 0.5  --   --   PROT 6.7 6.6  --   --   ALBUMIN 2.4* 2.5* 2.7* 2.5*   No results for input(s): LIPASE, AMYLASE in the last 168 hours. No results for input(s): AMMONIA in the last 168 hours. Coagulation Profile: No results for input(s): INR, PROTIME in the last 168 hours. Cardiac Enzymes: No results for input(s): CKTOTAL, CKMB, CKMBINDEX, TROPONINI in the last 168 hours. BNP (last 3 results) No results for input(s): PROBNP in the last 8760 hours. HbA1C: No results for input(s): HGBA1C in the last 72 hours. CBG: No results for input(s): GLUCAP in the last 168 hours. Lipid Profile: No results for input(s):  CHOL, HDL, LDLCALC, TRIG, CHOLHDL, LDLDIRECT in the last 72 hours. Thyroid Function Tests: No results for input(s): TSH, T4TOTAL, FREET4, T3FREE, THYROIDAB in the last 72 hours. Anemia Panel: No results for input(s): VITAMINB12, FOLATE, FERRITIN, TIBC, IRON, RETICCTPCT in the last 72 hours. Sepsis Labs: Recent Labs  Lab 01/20/20 0956 01/20/20 1931 01/21/20 0808  LATICACIDVEN 1.3 2.3* 1.1    Recent Results (from the past 240 hour(s))  Blood culture (routine x 2)     Status: Abnormal   Collection Time: 01/20/20  1:07 PM  Specimen: BLOOD LEFT HAND  Result Value Ref Range Status   Specimen Description BLOOD LEFT HAND  Final   Special Requests   Final    BOTTLES DRAWN AEROBIC ONLY Blood Culture results may not be optimal due to an inadequate volume of blood received in culture bottles   Culture  Setup Time   Final    GRAM POSITIVE COCCI IN CLUSTERS AEROBIC BOTTLE ONLY CRITICAL RESULT CALLED TO, READ BACK BY AND VERIFIED WITH: Parkman PHARMD @1804  01/21/20 EB Performed at [facility], [address] [place], [place] [address]    Culture STAPHYLOCOCCUS LUGDUNENSIS (A)  Final   Report Status 01/23/2020 FINAL  Final   Organism ID, Bacteria STAPHYLOCOCCUS LUGDUNENSIS  Final      Susceptibility   Staphylococcus lugdunensis - MIC*    CIPROFLOXACIN <=0.5 SENSITIVE Sensitive     ERYTHROMYCIN <=0.25 SENSITIVE Sensitive     GENTAMICIN <=0.5 SENSITIVE Sensitive     OXACILLIN 1 SENSITIVE Sensitive     TETRACYCLINE <=1 SENSITIVE Sensitive     VANCOMYCIN <=0.5 SENSITIVE Sensitive     TRIMETH/SULFA <=10 SENSITIVE Sensitive     CLINDAMYCIN <=0.25 SENSITIVE Sensitive     RIFAMPIN <=0.5 SENSITIVE Sensitive     Inducible Clindamycin NEGATIVE Sensitive     * STAPHYLOCOCCUS LUGDUNENSIS  Blood Culture ID Panel (Reflexed)     Status: Abnormal   Collection Time: 01/20/20  1:07 PM  Result Value Ref Range Status   Enterococcus faecalis NOT DETECTED NOT DETECTED Final   Enterococcus Faecium  NOT DETECTED NOT DETECTED Final   Listeria monocytogenes NOT DETECTED NOT DETECTED Final   Staphylococcus species DETECTED (A) NOT DETECTED Final    Comment: CRITICAL RESULT CALLED TO, READ BACK BY AND VERIFIED WITH: S,MILLS PHARMD @1804  01/21/20 EB    Staphylococcus aureus (BCID) NOT DETECTED NOT DETECTED Final   Staphylococcus epidermidis NOT DETECTED NOT DETECTED Final   Staphylococcus lugdunensis DETECTED (A) NOT DETECTED Final    Comment: CRITICAL RESULT CALLED TO, READ BACK BY AND VERIFIED WITH: S,MILLS PHARMD @1804  01/21/20 EB    Streptococcus species NOT DETECTED NOT DETECTED Final   Streptococcus agalactiae NOT DETECTED NOT DETECTED Final   Streptococcus pneumoniae NOT DETECTED NOT DETECTED Final   Streptococcus pyogenes NOT DETECTED NOT DETECTED Final   A.calcoaceticus-baumannii NOT DETECTED NOT DETECTED Final   Bacteroides fragilis NOT DETECTED NOT DETECTED Final   Enterobacterales NOT DETECTED NOT DETECTED Final   Enterobacter cloacae complex NOT DETECTED NOT DETECTED Final   Escherichia coli NOT DETECTED NOT DETECTED Final   Klebsiella aerogenes NOT DETECTED NOT DETECTED Final   Klebsiella oxytoca NOT DETECTED NOT DETECTED Final   Klebsiella pneumoniae NOT DETECTED NOT DETECTED Final   Proteus species NOT DETECTED NOT DETECTED Final   Salmonella species NOT DETECTED NOT DETECTED Final   Serratia marcescens NOT DETECTED NOT DETECTED Final   Haemophilus influenzae NOT DETECTED NOT DETECTED Final   Neisseria meningitidis NOT DETECTED NOT DETECTED Final   Pseudomonas aeruginosa NOT DETECTED NOT DETECTED Final   Stenotrophomonas maltophilia NOT DETECTED NOT DETECTED Final   Candida albicans NOT DETECTED NOT DETECTED Final   Candida auris NOT DETECTED NOT DETECTED Final   Candida glabrata NOT DETECTED NOT DETECTED Final   Candida krusei NOT DETECTED NOT DETECTED Final   Candida parapsilosis NOT DETECTED NOT DETECTED Final   Candida tropicalis NOT DETECTED NOT DETECTED Final    Cryptococcus neoformans/gattii NOT DETECTED NOT DETECTED Final   Methicillin resistance mecA/C NOT DETECTED NOT DETECTED Final  Comment: Performed at [facility], [place] [address], [place], [place] [address]  SARS Coronavirus 2 by RT PCR (hospital order, performed in [facility] hospital lab) Nasopharyngeal Nasopharyngeal Swab     Status: None   Collection Time: 01/20/20  1:12 PM   Specimen: Nasopharyngeal Swab  Result Value Ref Range Status   SARS Coronavirus 2 NEGATIVE NEGATIVE Final    Comment: (NOTE) SARS-CoV-2 target nucleic acids are NOT DETECTED.  The SARS-CoV-2 RNA is generally detectable in upper and lower respiratory specimens during the acute phase of infection. The lowest concentration of SARS-CoV-2 viral copies this assay can detect is 250 copies / mL. A negative result does not preclude SARS-CoV-2 infection and should not be used as the sole basis for treatment or other patient management decisions.  A negative result may occur with improper specimen collection / handling, submission of specimen other than nasopharyngeal swab, presence of viral mutation(s) within the areas targeted by this assay, and inadequate number of viral copies (<250 copies / mL). A negative result must be combined with clinical observations, patient history, and epidemiological information.  Fact Sheet for Patients:   StrictlyIdeas.no  Fact Sheet for Healthcare Providers: BankingDealers.co.za  This test is not yet approved or  cleared by the [place] FDA and has been authorized for detection and/or diagnosis of SARS-CoV-2 by FDA under an Emergency Use Authorization (EUA).  This EUA will remain in effect (meaning this test can be used) for the duration of the COVID-19 declaration under Section 564(b)(1) of the Act, 21 U.S.C. section 360bbb-3(b)(1), unless the authorization is terminated or revoked sooner.  Performed at [facility], [place] [address] [place], [place] [address]   Body fluid culture     Status: None (Preliminary result)   Collection Time: 01/20/20  6:15 PM   Specimen: Peritoneal Washings; Body Fluid  Result Value Ref Range Status   Specimen Description PERITONEAL FLUID  Final   Special Requests PERITONEAL DIALYSIS  Final   Gram Stain   Final    WBC PRESENT,BOTH PMN AND MONONUCLEAR NO ORGANISMS SEEN CYTOSPIN SMEAR    Culture   Final    NO GROWTH 3 DAYS Performed at [facility], [address] [place], [place] [address]    Report Status PENDING  Incomplete  Blood culture (routine x 2)     Status: Abnormal   Collection Time: 01/20/20  7:31 PM   Specimen: BLOOD LEFT HAND  Result Value Ref Range Status   Specimen Description BLOOD LEFT HAND  Final   Special Requests   Final    BOTTLES DRAWN AEROBIC ONLY Blood Culture results may not be optimal due to an inadequate volume of blood received in culture bottles   Culture  Setup Time   Final    AEROBIC BOTTLE ONLY GRAM POSITIVE COCCI IN CLUSTERS CRITICAL RESULT CALLED TO, READ BACK BY AND VERIFIED WITH: L SEAY PHARMD 01/22/20 0223 JDW    Culture (A)  Final    STAPHYLOCOCCUS LUGDUNENSIS SUSCEPTIBILITIES PERFORMED ON PREVIOUS CULTURE WITHIN THE LAST 5 DAYS. Performed at [facility], [place] [address], [place], [place] [address]    Report Status 01/23/2020 FINAL  Final  Culture, blood (Routine X 2) w Reflex to ID Panel     Status: None (Preliminary result)   Collection Time: 01/22/20  7:28 AM   Specimen: BLOOD LEFT ARM  Result Value Ref Range Status   Specimen Description BLOOD LEFT ARM  Final   Special  Requests   Final    BOTTLES DRAWN AEROBIC ONLY Blood Culture results may not be optimal due to an inadequate volume of blood received in culture bottles   Culture   Final    NO GROWTH 1 DAY Performed at [place] [address], [place], [place] [address]    Report Status PENDING  Incomplete  Culture, blood (Routine X  2) w Reflex to ID Panel     Status: None (Preliminary result)   Collection Time: 01/22/20  7:29 AM   Specimen: BLOOD LEFT HAND  Result Value Ref Range Status   Specimen Description BLOOD LEFT HAND  Final   Special Requests   Final    BOTTLES DRAWN AEROBIC ONLY Blood Culture results may not be optimal due to an inadequate volume of blood received in culture bottles   Culture   Final    NO GROWTH 1 DAY Performed at [facility], [place] [address], [place], [place] [address]    Report Status PENDING  Incomplete         Radiology Studies: IR Removal Tun Cv Cath W/O FL  Result Date: 01/21/2020 INDICATION: Patient history of ESRD on HD via right IJ tunneled HD catheter placed in OR 10/2017. Patient now tolerating PD without difficulties. Request is made for removal of tunneled HD catheter. EXAM: REMOVAL OF TUNNELED HEMODIALYSIS CATHETER MEDICATIONS: 15 mL 1% lidocaine COMPLICATIONS: None immediate. PROCEDURE: Informed written consent was obtained from the patient following an explanation of the procedure, risks, benefits and alternatives to treatment. A time out was performed prior to the initiation of the procedure. Maximal barrier sterile technique was utilized including caps, mask, sterile gowns, sterile gloves, large sterile drape, hand hygiene, and Hibiclens. 1% lidocaine was injected under sterile conditions along the subcutaneous tunnel. Utilizing a combination of blunt dissection and gentle traction, the catheter was removed intact. Hemostasis was obtained with manual compression. A dressing was placed. The patient tolerated the procedure well without immediate post procedural complication. IMPRESSION: Successful removal of tunneled dialysis catheter. Read by: Earley Abide, PA-C Electronically Signed   By: Corrie Mckusick D.O.   On: 01/21/2020 16:28   ECHO TEE  Result Date: 01/22/2020    TRANSESOPHOGEAL ECHO REPORT   Patient Name:   Adrian Frye Date of Exam: 01/22/2020 Medical Rec  #:  [id number]             Height:       67.0 in Accession #:    [id number]            Weight:       286.6 lb Date of Birth:  [birth year]             BSA:          2.356 m Patient Age:    49 years              BP:           162/89 mmHg Patient Gender: M                     HR:           95 bpm. Exam Location:  Inpatient Procedure: Transesophageal Echo, Color Doppler and Cardiac Doppler Indications:     Bacteremia 790.7 / R78.81  History:         Patient has prior history of Echocardiogram examinations. Risk  Factors:Hypertension and Sleep Apnea. End stage renal diseae,                  Anemia, thrombocytopenia.  Sonographer:     Darlina Sicilian RDCS Referring Phys:  [id number] Diagnosing Phys: Lyman Bishop MD  Sonographer Comments: Technically difficult study due to poor echo windows. Image acquisition challenging due to patient body habitus. PROCEDURE: The transesophogeal probe was passed without difficulty through the esophogus of the patient. Sedation performed by different physician. The patient was monitored while under deep sedation. Anesthestetic sedation was provided intravenously by Anesthesiology: 217mg  of Propofol. The patient developed no complications during the procedure. IMPRESSIONS  1. Left ventricular ejection fraction, by estimation, is 50 to 55%. The left ventricle has low normal function. There is mild left ventricular hypertrophy.  2. Right ventricular systolic function is normal. The right ventricular size is normal.  3. No left atrial/left atrial appendage thrombus was detected.  4. The mitral valve is grossly normal. Trivial mitral valve regurgitation.  5. The aortic valve is tricuspid. Aortic valve regurgitation is not visualized.  6. 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 The pulmonic valve was abnormal. Pulmonic valve regurgitation is moderate. Conclusion(s)/Recommendation(s): Findings are concerning for vegetation/infective endocarditis as detailed  above. FINDINGS  Left Ventricle: Left ventricular ejection fraction, by estimation, is 50 to 55%. The left ventricle has low normal function. The left ventricular internal cavity size was normal in size. There is mild left ventricular hypertrophy. Right Ventricle: The right ventricular size is normal. No increase in right ventricular wall thickness. Right ventricular systolic function is normal. Left Atrium: Left atrial size was normal in size. No left atrial/left atrial appendage thrombus was detected. Right Atrium: Right atrial size was normal in size. Pericardium: There is no evidence of pericardial effusion. Mitral Valve: The mitral valve is grossly normal. Trivial mitral valve regurgitation. Tricuspid Valve: The tricuspid valve is grossly normal. Tricuspid valve regurgitation is trivial. Aortic Valve: The aortic valve is tricuspid. Aortic valve regurgitation is not visualized. Pulmonic Valve: Large mobile 1 x 1.5 cm mass consistent with vegetation. The pulmonic valve was abnormal. Pulmonic valve regurgitation is moderate. Aorta: The aortic root and ascending aorta are structurally normal, with no evidence of dilitation. IAS/Shunts: No atrial level shunt detected by color flow Doppler. Lyman Bishop MD Electronically signed by Lyman Bishop MD Signature Date/Time: 01/22/2020/3:16:54 PM    Final         Scheduled Meds: . amLODipine  2.5 mg Oral Daily  . calcitRIOL  0.5 mcg Oral Daily  . Chlorhexidine Gluconate Cloth  6 each Topical Daily  . ferric citrate  840 mg Oral TID WC  . gentamicin cream  1 application Topical Daily  . senna-docusate  1 tablet Oral BID   Continuous Infusions: . [START ON 01/24/2020]  ceFAZolin (ANCEF) IV    . dialysis solution 1.5% low-MG/low-CA    . dialysis solution 1.5% low-MG/low-CA       LOS: 3 days    Time spent: 35 minutes    Kaneesha Constantino A Tahir Blank, MD [facility]   If 7PM-7AM, please contact night-coverage [email or web address]  01/23/2020, 2:12 PM

## 2020-01-23 NOTE — Progress Notes (Signed)
    [facility] for Infectious Disease   Reason for visit: Follow up on endocarditis  Interval History: TEE with pulmonic valve endocarditis. Repeat blood cultures here with Staph lugundensis.  Also same as outpatient cultures per nephrology.     Physical Exam: Constitutional:  Vitals:   01/23/20 0457 01/23/20 0945  BP: 130/68 (!) 145/88  Pulse: 94 97  Resp: 16 16  Temp: 98.8 F (37.1 C) 98.7 F (37.1 C)  SpO2: 92% 98%   patient appears in NAD  Impression: will change to cefazolin  Plan: 1.  6 weeks of cefazolin 2.  Ok for a line tomorrow if the repeat blood cultures remain negative.  I will order for tunneled catheter through IR for tomorrow.

## 2020-01-23 NOTE — Anesthesia Postprocedure Evaluation (Signed)
Anesthesia Post Note  Patient: Adrian Frye  Procedure(s) Performed: TRANSESOPHAGEAL ECHOCARDIOGRAM (TEE) (N/A )     Patient location during evaluation: PACU Anesthesia Type: MAC Level of consciousness: awake and alert Pain management: pain level controlled Vital Signs Assessment: post-procedure vital signs reviewed and stable Respiratory status: spontaneous breathing Cardiovascular status: stable Anesthetic complications: no   No complications documented.  Last Vitals:  Vitals:   01/22/20 2057 01/23/20 0457  BP: 116/64 130/68  Pulse: 99 94  Resp: 18 16  Temp: 37.3 C 37.1 C  SpO2: 96% 92%    Last Pain:  Vitals:   01/23/20 0457  TempSrc: Oral  PainSc:                  Nolon Nations

## 2020-01-23 NOTE — Progress Notes (Signed)
Notified MD Regalado of k=3.2

## 2020-01-23 NOTE — Progress Notes (Signed)
PHARMACY CONSULT NOTE FOR:  OUTPATIENT  PARENTERAL ANTIBIOTIC THERAPY (OPAT)  Indication: Staph. lugdunensis PV Endocarditis Regimen: Cefazolin 1g daily  End date: 03/04/20  Plan to treat for 6 weeks from 01/22/20 negative blood cultures.  IV antibiotic discharge orders are pended. To discharging provider:  please sign these orders via discharge navigator,  Select New Orders & click on the button choice - Manage This Unsigned Work.     Thank you for allowing pharmacy to be a part of this patient's care.  Alfonse Spruce, PharmD PGY2 ID Pharmacy Resident [phone]  01/23/2020, 11:46 AM

## 2020-01-23 NOTE — Progress Notes (Signed)
[facility] Progress Note   Subjective:   Patient seen in room. 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 Patient is feeling well, no SOB, CP, palpitations, dizziness, abdominal pain.   Objective Vitals:   01/22/20 1646 01/22/20 2057 01/23/20 0457 01/23/20 0945  BP: 127/85 116/64 130/68 (!) 145/88  Pulse: 96 99 94 97  Resp: 18 18 16 16   Temp: 98.6 F (37 C) 99.1 F (37.3 C) 98.8 F (37.1 C) 98.7 F (37.1 C)  TempSrc: Oral  Oral Oral  SpO2: 98% 96% 92% 98%  Weight:      Height:       Physical Exam General: Well developed male, alert and in NAD Heart: RRR, no murmurs Lungs: CTA bilaterally without wheezing, rhonchi or rales Abdomen: Soft, non-tender, non-distended, +BS. PD cath in place draining clear straw-colored fluid Extremities: No edema bilateral lower extremities  Additional Objective Labs: Basic Metabolic Panel: Recent Labs  Lab 01/21/20 0808 01/22/20 0729 01/23/20 0407  NA 130* 130* 130*  K 3.5 3.4* 3.2*  CL 91* 90* 90*  CO2 19* 19* 20*  GLUCOSE 129* 128* 121*  BUN 77* 77* 79*  CREATININE 16.52* 16.30* 16.14*  CALCIUM 7.7* 8.0* 8.2*  PHOS  --  6.0* 6.9*   Liver Function Tests: Recent Labs  Lab 01/20/20 0956 01/20/20 0956 01/21/20 0808 01/22/20 0729 01/23/20 0407  AST 28  --  24  --   --   ALT 44  --  42  --   --   ALKPHOS 99  --  95  --   --   BILITOT 0.5  --  0.5  --   --   PROT 6.7  --  6.6  --   --   ALBUMIN 2.4*   < > 2.5* 2.7* 2.5*   < > = values in this interval not displayed.   CBC: Recent Labs  Lab 01/20/20 0956 01/20/20 0956 01/21/20 0808 01/22/20 0729 01/23/20 0407  WBC 18.1*   < > 17.2* 15.1* 15.1*  NEUTROABS 13.4*  --   --  11.0* 11.2*  HGB 9.9*   < > 9.5* 9.5* 9.4*  HCT 30.9*   < > 29.3* 29.8* 28.9*  MCV 88.0  --  87.2 88.7 89.2  PLT 44*   < > 52* 70* 81*   < > = values in this interval not displayed.   Blood Culture    Component Value Date/Time   SDES BLOOD LEFT HAND 01/22/2020 0729    SPECREQUEST  01/22/2020 0729    BOTTLES DRAWN AEROBIC ONLY Blood Culture results may not be optimal due to an inadequate volume of blood received in culture bottles   CULT  01/22/2020 0729    NO GROWTH 1 DAY Performed at [facility], [place] [address] [place], [place] [address]    REPTSTATUS PENDING 01/22/2020 6045   Studies/Results: IR Removal Tun Cv Cath W/O FL  Result Date: 01/21/2020 INDICATION: Patient history of ESRD on HD via right IJ tunneled HD catheter placed in OR 10/2017. Patient now tolerating PD without difficulties. Request is made for removal of tunneled HD catheter. EXAM: REMOVAL OF TUNNELED HEMODIALYSIS CATHETER MEDICATIONS: 15 mL 1% lidocaine COMPLICATIONS: None immediate. PROCEDURE: Informed written consent was obtained from the patient following an explanation of the procedure, risks, benefits and alternatives to treatment. A time out was performed prior to the initiation of the procedure. Maximal barrier sterile technique was utilized including caps, mask, sterile gowns, sterile gloves, large sterile drape, hand hygiene,  and Hibiclens. 1% lidocaine was injected under sterile conditions along the subcutaneous tunnel. Utilizing a combination of blunt dissection and gentle traction, the catheter was removed intact. Hemostasis was obtained with manual compression. A dressing was placed. The patient tolerated the procedure well without immediate post procedural complication. IMPRESSION: Successful removal of tunneled dialysis catheter. Read by: Earley Abide, PA-C Electronically Signed   By: Corrie Mckusick D.O.   On: 01/21/2020 16:28   ECHO TEE  Result Date: 01/22/2020    TRANSESOPHOGEAL ECHO REPORT   Patient Name:   Adrian Frye Date of Exam: 01/22/2020 Medical Rec #:  [id number]             Height:       67.0 in Accession #:    [id number]            Weight:       286.6 lb Date of Birth:  1970/08/23             BSA:          2.356 m Patient Age:    49 years              BP:            162/89 mmHg Patient Gender: M                     HR:           95 bpm. Exam Location:  Inpatient Procedure: Transesophageal Echo, Color Doppler and Cardiac Doppler Indications:     Bacteremia 790.7 / R78.81  History:         Patient has prior history of Echocardiogram examinations. Risk                  Factors:Hypertension and Sleep Apnea. End stage renal diseae,                  Anemia, thrombocytopenia.  Sonographer:     Darlina Sicilian RDCS Referring Phys:  [id number] [place] Diagnosing Phys: Lyman Bishop MD  Sonographer Comments: Technically difficult study due to poor echo windows. Image acquisition challenging due to patient body habitus. PROCEDURE: The transesophogeal probe was passed without difficulty through the esophogus of the patient. Sedation performed by different physician. The patient was monitored while under deep sedation. Anesthestetic sedation was provided intravenously by Anesthesiology: 217mg  of Propofol. The patient developed no complications during the procedure. IMPRESSIONS  1. Left ventricular ejection fraction, by estimation, is 50 to 55%. The left ventricle has low normal function. There is mild left ventricular hypertrophy.  2. Right ventricular systolic function is normal. The right ventricular size is normal.  3. No left atrial/left atrial appendage thrombus was detected.  4. The mitral valve is grossly normal. Trivial mitral valve regurgitation.  5. The aortic valve is tricuspid. Aortic valve regurgitation is not visualized.  6. 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 The pulmonic valve was abnormal. Pulmonic valve regurgitation is moderate. Conclusion(s)/Recommendation(s): Findings are concerning for vegetation/infective endocarditis as detailed above. FINDINGS  Left Ventricle: Left ventricular ejection fraction, by estimation, is 50 to 55%. The left ventricle has low normal function. The left ventricular internal cavity size was normal in size. There is  mild left ventricular hypertrophy. Right Ventricle: The right ventricular size is normal. No increase in right ventricular wall thickness. Right ventricular systolic function is normal. Left Atrium: Left atrial size was normal in size. No left atrial/left atrial appendage  thrombus was detected. Right Atrium: Right atrial size was normal in size. Pericardium: There is no evidence of pericardial effusion. Mitral Valve: The mitral valve is grossly normal. Trivial mitral valve regurgitation. Tricuspid Valve: The tricuspid valve is grossly normal. Tricuspid valve regurgitation is trivial. Aortic Valve: The aortic valve is tricuspid. Aortic valve regurgitation is not visualized. Pulmonic Valve: Large mobile 1 x 1.5 cm mass consistent with vegetation. The pulmonic valve was abnormal. Pulmonic valve regurgitation is moderate. Aorta: The aortic root and ascending aorta are structurally normal, with no evidence of dilitation. IAS/Shunts: No atrial level shunt detected by color flow Doppler. Lyman Bishop MD Electronically signed by Lyman Bishop MD Signature Date/Time: 01/22/2020/3:16:54 PM    Final    Medications: . dialysis solution 1.5% low-MG/low-CA     . amLODipine  2.5 mg Oral Daily  . calcitRIOL  0.5 mcg Oral Daily  . Chlorhexidine Gluconate Cloth  6 each Topical Daily  . ferric citrate  840 mg Oral TID WC  . gentamicin cream  1 application Topical Daily  . senna-docusate  1 tablet Oral BID  . vancomycin variable dose per unstable renal function (pharmacist dosing)   Does not apply See admin instructions    Dialysis Orders: CCPD 7x week 6 exchanges, all 1.5% dextrose, fill volume 3000 ml Day dwell 1.5% dextrose, 2000 mL EDW 128kg  01/17/20 labs Hgb 10.8, tsat 25%, WBC 13.07, Plt 82, Na 132, K+ 3.5, Bicarbonate 20, Alb 3.7, Ca 8.0, Phos 6.4   Assessment/Plan: 1. Bacteremia: Outpatient blood cultures growing staph. Patient started on IV vancomycin. Suspect TDC is likely source of infection given  recent drainage from exit site(no longer in use for dialysis but patient left in place for blood draws but outpatient HD unit confirms they have an RN who can draw his labs peripherally). 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 Plan for 6 week cefazolin per ID 2. ESRD:On PD, continue 6 exchanges overnight (3L) and daytime dwell (2L) with all 1.5% dextrose.Outpatient clearances not to goal but patient does not want to return to HD, hoping for eventual transplant after weight loss. 3. Hypokalemia: K+ running low, 3.2 today. Receiving KCl 69mEq PO. Continue daily BMP, replete as needed.  4. Hypertension/volume:BP controlled, does not appear volume overloaded. Continue all 1.5% exchanges. Hyponatremia noted with Na 128-improved to 130.Continue home BP meds (amlodipine). 5. Anemia:Hgb 9.4-slowly trending down. Patient declines aranesp. Resume mircera at discharge.  6. Metabolic bone disease:Corrected calcium 9.4. Continue calcitriol 0.73mcg daily and auryxia 4 tabs TID with meals. 7. Thrombocytopenia:Plt trending up, 81 today, anticoagulation on hold. Per primary.  Anice Paganini, PA-C 01/23/2020, 11:01 AM  [facility] Pager: [phone]

## 2020-01-23 NOTE — Progress Notes (Addendum)
Pharmacy Antibiotic Note  Adrian Frye is a 49 y.o. male admitted on 01/20/2020 with bacteremia.  Pharmacy has been consulted for vancomycin dosing. He presented to outpatient PD clinic on 9/3 reporting he was feeling poorly. Blood cultures were taken and patient was started on cephalexin. On 9/5, blood cultures reported GPC's. Patient was instructed to come to ED for IV abx and infectious workup.   Pt is now on D4 of vancomycin. He did get one dose of oritavancin on 9/7 for possibility that the patient might leave. Supposedly, his outpt culture grew staph but we don't have the result yet. His blood cultures here are growing staph lugdenensis with sensitivities pending. F/u with sensitivity to optimize abx. He should have vanc and oritavancin in his system.   TEE>>positive for pulmonic valve endocarditis.   Plan:  F/u with abx plan  Height: 5\' 7"  (170.2 cm) Weight: 130 kg (286 lb 9.6 oz) IBW/kg (Calculated) : 66.1  Temp (24hrs), Avg:98.5 F (36.9 C), Min:98 F (36.7 C), Max:99.1 F (37.3 C)  Recent Labs  Lab 01/20/20 0956 01/20/20 1931 01/21/20 0808 01/22/20 0729 01/23/20 0407  WBC 18.1*  --  17.2* 15.1* 15.1*  CREATININE 16.98*  --  16.52* 16.30* 16.14*  LATICACIDVEN 1.3 2.3* 1.1  --   --     Estimated Creatinine Clearance: 7.2 mL/min (A) (by C-G formula based on SCr of 16.14 mg/dL (H)).    Allergies  Allergen Reactions  . Lisinopril Swelling and Anaphylaxis    Angioedema  Other reaction(s): Angioedema (ALLERGY/intolerance)  . Losartan Swelling and Anaphylaxis    Antimicrobials this admission: Vancomycin 9/6 >> Oritavancin 9/7 x1 Dose adjustments this admission: N/A  Microbiology results: 9/6 BCx: staph lugdenensis>>pan sensitive 9/8 blood>>ngtd   Onnie Boer, PharmD, BCIDP, AAHIVP, CPP Infectious Disease Pharmacist 01/23/2020 8:47 AM

## 2020-01-23 NOTE — Plan of Care (Signed)
  Problem: Education: Goal: Knowledge of General Education information will improve Description Including pain rating scale, medication(s)/side effects and non-pharmacologic comfort measures Outcome: Progressing   

## 2020-01-24 ENCOUNTER — Other Ambulatory Visit: Payer: Self-pay | Admitting: Internal Medicine

## 2020-01-24 ENCOUNTER — Inpatient Hospital Stay (HOSPITAL_COMMUNITY): Payer: Federal, State, Local not specified - PPO

## 2020-01-24 DIAGNOSIS — R7881 Bacteremia: Secondary | ICD-10-CM

## 2020-01-24 DIAGNOSIS — Z992 Dependence on renal dialysis: Secondary | ICD-10-CM | POA: Diagnosis not present

## 2020-01-24 DIAGNOSIS — N2581 Secondary hyperparathyroidism of renal origin: Secondary | ICD-10-CM | POA: Diagnosis not present

## 2020-01-24 DIAGNOSIS — N186 End stage renal disease: Secondary | ICD-10-CM | POA: Diagnosis not present

## 2020-01-24 HISTORY — PX: IR US GUIDE VASC ACCESS LEFT: IMG2389

## 2020-01-24 HISTORY — PX: IR FLUORO GUIDE CV LINE LEFT: IMG2282

## 2020-01-24 HISTORY — PX: IR PERC TUN PERIT CATH WO PORT S&I /IMAG: IMG2327

## 2020-01-24 LAB — CBC WITH DIFFERENTIAL/PLATELET
Abs Immature Granulocytes: 0.63 10*3/uL — ABNORMAL HIGH (ref 0.00–0.07)
Basophils Absolute: 0.1 10*3/uL (ref 0.0–0.1)
Basophils Relative: 1 %
Eosinophils Absolute: 0.2 10*3/uL (ref 0.0–0.5)
Eosinophils Relative: 1 %
HCT: 29.7 % — ABNORMAL LOW (ref 39.0–52.0)
Hemoglobin: 9.6 g/dL — ABNORMAL LOW (ref 13.0–17.0)
Immature Granulocytes: 4 %
Lymphocytes Relative: 11 %
Lymphs Abs: 1.6 10*3/uL (ref 0.7–4.0)
MCH: 28.9 pg (ref 26.0–34.0)
MCHC: 32.3 g/dL (ref 30.0–36.0)
MCV: 89.5 fL (ref 80.0–100.0)
Monocytes Absolute: 1.2 10*3/uL — ABNORMAL HIGH (ref 0.1–1.0)
Monocytes Relative: 9 %
Neutro Abs: 10.6 10*3/uL — ABNORMAL HIGH (ref 1.7–7.7)
Neutrophils Relative %: 74 %
Platelets: 124 10*3/uL — ABNORMAL LOW (ref 150–400)
RBC: 3.32 MIL/uL — ABNORMAL LOW (ref 4.22–5.81)
RDW: 17.8 % — ABNORMAL HIGH (ref 11.5–15.5)
WBC: 14.3 10*3/uL — ABNORMAL HIGH (ref 4.0–10.5)
nRBC: 0 % (ref 0.0–0.2)

## 2020-01-24 LAB — RENAL FUNCTION PANEL
Albumin: 2.9 g/dL — ABNORMAL LOW (ref 3.5–5.0)
Anion gap: 21 — ABNORMAL HIGH (ref 5–15)
BUN: 79 mg/dL — ABNORMAL HIGH (ref 6–20)
CO2: 20 mmol/L — ABNORMAL LOW (ref 22–32)
Calcium: 8.5 mg/dL — ABNORMAL LOW (ref 8.9–10.3)
Chloride: 92 mmol/L — ABNORMAL LOW (ref 98–111)
Creatinine, Ser: 16.3 mg/dL — ABNORMAL HIGH (ref 0.61–1.24)
GFR calc Af Amer: 3 mL/min — ABNORMAL LOW (ref >60)
GFR calc non Af Amer: 3 mL/min — ABNORMAL LOW (ref >60)
Glucose, Bld: 117 mg/dL — ABNORMAL HIGH (ref 70–99)
Phosphorus: 6.2 mg/dL — ABNORMAL HIGH (ref 2.5–4.6)
Potassium: 3.6 mmol/L (ref 3.5–5.1)
Sodium: 133 mmol/L — ABNORMAL LOW (ref 135–145)

## 2020-01-24 LAB — BODY FLUID CULTURE: Culture: NO GROWTH

## 2020-01-24 MED ORDER — SENNOSIDES-DOCUSATE SODIUM 8.6-50 MG PO TABS: 1.0000 | ORAL_TABLET | Freq: Two times a day (BID) | ORAL | 0 refills | Status: DC

## 2020-01-24 MED ORDER — CEFAZOLIN IV (FOR PTA / DISCHARGE USE ONLY): 1.0000 g | INTRAVENOUS | 0 refills | Status: AC

## 2020-01-24 MED ORDER — CHLORHEXIDINE GLUCONATE 4 % EX LIQD
CUTANEOUS | Status: AC
  Filled 2020-01-24: qty 15

## 2020-01-24 MED ORDER — LIDOCAINE HCL 1 % IJ SOLN
INTRAMUSCULAR | Status: AC
  Filled 2020-01-24: qty 20

## 2020-01-24 NOTE — Progress Notes (Signed)
[facility] Infect[facility]   Reason for visit: Follow up on pulmonic valve endocarditis   Interval History: on his way to get his line placed.  Remains afebrile.  No acute events.   Physical Exam: Constitutional:  Vitals:   01/24/20 0435 01/24/20 0903  BP: (!) 152/85 138/82  Pulse: 94 89  Resp: 18 18  Temp: 98.8 F (37.1 C) 98.2 F (36.8 C)  SpO2: 97% 96%   patient appears in NAD Respiratory: Normal respiratory effort; CTA B Cardiovascular: RRR GI: soft, nt, nd  Review of Systems: Constitutional: negative for fevers and chills  Lab Results  Component Value Date   WBC 15.1 (H) 01/23/2020   HGB 9.4 (L) 01/23/2020   HCT 28.9 (L) 01/23/2020   MCV 89.2 01/23/2020   PLT 81 (L) 01/23/2020    Lab Results  Component Value Date   CREATININE 16.14 (H) 01/23/2020   BUN 79 (H) 01/23/2020   NA 130 (L) 01/23/2020   K 3.2 (L) 01/23/2020   CL 90 (L) 01/23/2020   CO2 20 (L) 01/23/2020    Lab Results  Component Value Date   ALT 42 01/21/2020   AST 24 01/21/2020   ALKPHOS 95 01/21/2020     Microbiology: Recent Results (from the past 240 hour(s))  Blood culture (routine x 2)     Status: Abnormal   Collection Time: 01/20/20  1:07 PM   Specimen: BLOOD LEFT HAND  Result Value Ref Range Status   Specimen Description BLOOD LEFT HAND  Final   Special Requests   Final    BOTTLES DRAWN AEROBIC ONLY Blood Culture results may not be optimal due to an inadequate volume of blood received in culture bottles   Culture  Setup Time   Final    GRAM POSITIVE COCCI IN CLUSTERS AEROBIC BOTTLE ONLY CRITICAL RESULT CALLED TO, READ BACK BY AND VERIFIED WITH: Foley PHARMD _0  01/21/20 EB Performed at [facility], [address] [place], [place] [address]    Culture STAPHYLOCOCCUS LUGDUNENSIS (A)  Final   Report Status 01/23/2020 FINAL  Final   Organism ID, Bacteria STAPHYLOCOCCUS LUGDUNENSIS  Final      Susceptibility   Staphylococcus lugdunensis - MIC*     CIPROFLOXACIN <=0.5 SENSITIVE Sensitive     ERYTHROMYCIN <=0.25 SENSITIVE Sensitive     GENTAMICIN <=0.5 SENSITIVE Sensitive     OXACILLIN 1 SENSITIVE Sensitive     TETRACYCLINE <=1 SENSITIVE Sensitive     VANCOMYCIN <=0.5 SENSITIVE Sensitive     TRIMETH/SULFA <=10 SENSITIVE Sensitive     CLINDAMYCIN <=0.25 SENSITIVE Sensitive     RIFAMPIN <=0.5 SENSITIVE Sensitive     Inducible Clindamycin NEGATIVE Sensitive     * STAPHYLOCOCCUS LUGDUNENSIS  Blood Culture ID Panel (Reflexed)     Status: Abnormal   Collection Time: 01/20/20  1:07 PM  Result Value Ref Range Status   Enterococcus faecalis NOT DETECTED NOT DETECTED Final   Enterococcus Faecium NOT DETECTED NOT DETECTED Final   Listeria monocytogenes NOT DETECTED NOT DETECTED Final   Staphylococcus species DETECTED (A) NOT DETECTED Final    Comment: CRITICAL RESULT CALLED TO, READ BACK BY AND VERIFIED WITH: S,MILLS PHARMD _1  01/21/20 EB    Staphylococcus aureus (BCID) NOT DETECTED NOT DETECTED Final   Staphylococcus epidermidis NOT DETECTED NOT DETECTED Final   Staphylococcus lugdunensis DETECTED (A) NOT DETECTED Final    Comment: CRITICAL RESULT CALLED TO, READ BACK BY AND VERIFIED WITH: S,MILLS PHARMD _2  01/21/20 EB  Streptococcus species NOT DETECTED NOT DETECTED Final   Streptococcus agalactiae NOT DETECTED NOT DETECTED Final   Streptococcus pneumoniae NOT DETECTED NOT DETECTED Final   Streptococcus pyogenes NOT DETECTED NOT DETECTED Final   A.calcoaceticus-baumannii NOT DETECTED NOT DETECTED Final   Bacteroides fragilis NOT DETECTED NOT DETECTED Final   Enterobacterales NOT DETECTED NOT DETECTED Final   Enterobacter cloacae complex NOT DETECTED NOT DETECTED Final   Escherichia coli NOT DETECTED NOT DETECTED Final   Klebsiella aerogenes NOT DETECTED NOT DETECTED Final   Klebsiella oxytoca NOT DETECTED NOT DETECTED Final   Klebsiella pneumoniae NOT DETECTED NOT DETECTED Final   Proteus species NOT DETECTED NOT DETECTED Final    Salmonella species NOT DETECTED NOT DETECTED Final   Serratia marcescens NOT DETECTED NOT DETECTED Final   Haemophilus influenzae NOT DETECTED NOT DETECTED Final   Neisseria meningitidis NOT DETECTED NOT DETECTED Final   Pseudomonas aeruginosa NOT DETECTED NOT DETECTED Final   Stenotrophomonas maltophilia NOT DETECTED NOT DETECTED Final   Candida albicans NOT DETECTED NOT DETECTED Final   Candida auris NOT DETECTED NOT DETECTED Final   Candida glabrata NOT DETECTED NOT DETECTED Final   Candida krusei NOT DETECTED NOT DETECTED Final   Candida parapsilosis NOT DETECTED NOT DETECTED Final   Candida tropicalis NOT DETECTED NOT DETECTED Final   Cryptococcus neoformans/gattii NOT DETECTED NOT DETECTED Final   Methicillin resistance mecA/C NOT DETECTED NOT DETECTED Final    Comment: Performed at [facility], [address], [place], [place] [address]  SARS Coronavirus 2 by RT PCR (hospital order, performed in [facility] hospital lab) Nasopharyngeal Nasopharyngeal Swab     Status: None   Collection Time: 01/20/20  1:12 PM   Specimen: Nasopharyngeal Swab  Result Value Ref Range Status   SARS Coronavirus 2 NEGATIVE NEGATIVE Final    Comment: (NOTE) SARS-CoV-2 target nucleic acids are NOT DETECTED.  The SARS-CoV-2 RNA is generally detectable in upper and lower respiratory specimens during the acute phase of infection. The lowest concentration of SARS-CoV-2 viral copies this assay can detect is 250 copies / mL. A negative result does not preclude SARS-CoV-2 infection and should not be used as the sole basis for treatment or other patient management decisions.  A negative result may occur with improper specimen collection / handling, submission of specimen other than nasopharyngeal swab, presence of viral mutation(s) within the areas targeted by this assay, and inadequate number of viral copies (<250 copies / mL). A negative result must be combined with clinical observations, patient  history, and epidemiological information.  Fact Sheet for Patients:   StrictlyIdeas.no  Fact Sheet for Healthcare Providers: BankingDealers.co.za  This test is not yet approved or  cleared by the [place] FDA and has been authorized for detection and/or diagnosis of SARS-CoV-2 by FDA under an Emergency Use Authorization (EUA).  This EUA will remain in effect (meaning this test can be used) for the duration of the COVID-19 declaration under Section 564(b)(1) of the Act, 21 U.S.C. section 360bbb-3(b)(1), unless the authorization is terminated or revoked sooner.  Performed at [facility], [place] [address] [place], [place] [address]   Body fluid culture     Status: None   Collection Time: 01/20/20  6:15 PM   Specimen: Peritoneal Washings; Body Fluid  Result Value Ref Range Status   Specimen Description PERITONEAL FLUID  Final   Special Requests PERITONEAL DIALYSIS  Final   Gram Stain   Final    WBC PRESENT,BOTH PMN AND MONONUCLEAR NO  ORGANISMS SEEN CYTOSPIN SMEAR    Culture   Final    NO GROWTH 3 DAYS Performed at [facility], [place] [address] [place], [place] [address]    Report Status 01/24/2020 FINAL  Final  Blood culture (routine x 2)     Status: Abnormal   Collection Time: 01/20/20  7:31 PM   Specimen: BLOOD LEFT HAND  Result Value Ref Range Status   Specimen Description BLOOD LEFT HAND  Final   Special Requests   Final    BOTTLES DRAWN AEROBIC ONLY Blood Culture results may not be optimal due to an inadequate volume of blood received in culture bottles   Culture  Setup Time   Final    AEROBIC BOTTLE ONLY GRAM POSITIVE COCCI IN CLUSTERS CRITICAL RESULT CALLED TO, READ BACK BY AND VERIFIED WITH: L SEAY PHARMD 01/22/20 0223 JDW    Culture (A)  Final    STAPHYLOCOCCUS LUGDUNENSIS SUSCEPTIBILITIES PERFORMED ON PREVIOUS CULTURE WITHIN THE LAST 5 DAYS. Performed at [facility], [place] [address]  [place], [place] [address]    Report Status 01/23/2020 FINAL  Final  Culture, blood (Routine X 2) w Reflex to ID Panel     Status: None (Preliminary result)   Collection Time: 01/22/20  7:28 AM   Specimen: BLOOD LEFT ARM  Result Value Ref Range Status   Specimen Description BLOOD LEFT ARM  Final   Special Requests   Final    BOTTLES DRAWN AEROBIC ONLY Blood Culture results may not be optimal due to an inadequate volume of blood received in culture bottles   Culture   Final    NO GROWTH 2 DAYS Performed at [facility], [place] [address] [place], [place] [address]    Report Status PENDING  Incomplete  Culture, blood (Routine X 2) w Reflex to ID Panel     Status: None (Preliminary result)   Collection Time: 01/22/20  7:29 AM   Specimen: BLOOD LEFT HAND  Result Value Ref Range Status   Specimen Description BLOOD LEFT HAND  Final   Special Requests   Final    BOTTLES DRAWN AEROBIC ONLY Blood Culture results may not be optimal due to an inadequate volume of blood received in culture bottles   Culture   Final    NO GROWTH 2 DAYS Performed at [facility], [place] [address] [place], [place] [address]    Report Status PENDING  Incomplete    Impression/Plan:  1. Pulmonic valve endocarditis - Staph lugundensis, methicillin sensitive on blood cultures at dialysis and inpatient as well.  Repeat blood cultures ngtd.    2. Medication monitoring - will get cefazolin 1 gram daily via line, adjusted for paritoneal dialysis.    3.  Access - line being placed today.  I have order an IR removal of the catheter for October 11th.    Diagnosis: Pulmonic valve endocarditis  Culture Result: Staph lugdunensis, methicillin sensitive  Allergies  Allergen Reactions  . Lisinopril Swelling and Anaphylaxis    Angioedema  Other reaction(s): Angioedema (ALLERGY/intolerance)  . Losartan Swelling and Anaphylaxis    OPAT Orders Discharge antibiotics to be given via PICC line Discharge antibiotics:  cefazolin Per pharmacy protocol yes Duration: 6 weeks End Date: 10/20  [facility] Care Per Protocol: yes (central line)  Home health RN for IV administration and teaching; PICC line care and labs.    Labs weekly while on IV antibiotics: _x_ CBC with differential _x_ BMP __ CMP __  CRP __ ESR __ Vancomycin trough __ CK  _x_ Please pull PIC at completion of IV antibiotics - will need to arrange IR removal __ Please leave PIC in place until doctor has seen patient or been notified  Fax weekly labs to [phone]  Clinic Follow Up Appt: 10/11  @ 3 PM with Dr. Linus Salmons

## 2020-01-24 NOTE — Progress Notes (Signed)
DISCHARGE NOTE HOME Adrian Frye to be discharged Home per MD order. Discussed prescriptions and follow up appointments with the patient. Prescriptions given to patient; medication list explained in detail. Patient verbalized understanding.  Skin clean, dry and intact without evidence of skin break down, no evidence of skin tears noted. IV catheter discontinued intact. Site without signs and symptoms of complications. Dressing and pressure applied. Pt denies pain at the site currently. No complaints noted.  Patient free of lines, drains, and wounds.   An After Visit Summary (AVS) was printed and given to the patient. Patient escorted via wheelchair, and discharged home via private auto.  Aneta Mins BSN, RN3

## 2020-01-24 NOTE — TOC Transition Note (Signed)
Transition of Care [facility]) - CM/SW Discharge Note   Patient Details  Name: Adrian Frye MRN: [id number] Date of Birth: [birth year]  Transition of [facility] CM/SW Contact:  Bartholomew Crews, RN Phone Number: [phone] 01/24/2020, 3:41 PM   Clinical Narrative:     Patient ready to transition home. Training for IV antibiotics completed with [employer] liaison. [place] accepted referral to for [facility] RN for start of care on Monday 9/13. Patient agreeable to start of care date. [place] RN orders placed by MD. No further TOC needs identifed.   Final next level of care: Home w Home Health Services Barriers to Discharge: No Barriers Identified   Patient Goals and CMS Choice Patient states their goals for this hospitalization and ongoing recovery are:: return home with wife CMS Medicare.gov Compare Post Acute Care list provided to:: Patient Choice offered to / list presented to : Patient, Spouse  Discharge Placement                       Discharge Plan and Services In-house Referral: NA Discharge Planning Services: CM Consult Post Acute Care Choice: Home Health          DME Arranged: N/A DME Agency: NA       HH Arranged: RN [place] Agency: [place] ([place]) Date [place]: 01/24/20 Time [place]: [place] Representative spoke with at [place]: [place] ([place]) Interventions     Readmission Risk Interventions No flowsheet data found.

## 2020-01-24 NOTE — Discharge Summary (Signed)
Physician Discharge Summary  Jameire Kouba EXB:[id number] DOB: [birth year] DOA: 01/20/2020  PCP: Girtha Rm, NP-C  Admit date: 01/20/2020 Discharge date: 01/24/2020  Admitted From: Home  Disposition: Home   Recommendations for Outpatient Follow-up:  1. Follow up with PCP in 1-2 weeks 2. Please obtain BMP/CBC in one week 3. Follow up with ID for further treatment and evaluation of Endocarditis.   Home Health: Yes.   Discharge Condition: Stable.  CODE STATUS: Full code Diet recommendation: Heart Healthy   Brief/Interim Summary: 49 year old with past medical history significant for ESRD on PD, hypertension, morbid obesity, sleep apnea, TDC for blood draws, presented to the ED not feeling well and positive blood cultures.  Patient report feeling unwell for 5 days prior to admission, report fever, nausea and chills. His blood cultures came back positive for staph aureus, therefore patient was recommended to present to the ER for further evaluation and management.  Noticed some drainage from [facility].   -Patient admitted with staph Aureus  bacteremia, secondary to [facility] infection.  He underwent removal of right IJ tunnel hemodialysis catheter on 9 11/2019.  -Patient underwent TEE 9/8; pulmonic valve: There was a suggestion of mobile density on TEE that was confirmed with surface imaging, measuring 1x1,5 centimeter consistent with vegetation.  There is associated moderate regurgitation.  There is not a stenosis.  1-Staph  Bacteremia, Pulmonic valve endocarditis -Blood cultures from 9/06 grew staph lugdunensis. Pan sensitive. Similar culture result form out patient HD per ID notes.  -Okay for central line placement on Friday if repeated blood cultures from 9/08  remain negative per ID recommendation. -He will need 6 weeks of IV antibiotic Ancef. Last dose antibiotics 03-04-2020. -Removal of right IJ tunnel hemodialysis catheter on 9 11/2019.  -Blood culture 9/08: No growth to date.   -Underwent placement of left IJ tunneled power line 9/10 for Home IV antibiotics, and after completion of IV antibiotics and follow up with ID, line will need to be removed.  -WBC trending down. Stable for discharge.   2-Sepsis secondary to staph bacteremia in the setting of TDC; Presented with leukocytosis, tachycardia, Tachypnea, lactic acidosis. sourice of infection TDC infection, bacteremia.  Continue with IV antibiotics, needs 6 weeks IV ancef.  Sepsis present on admission.   ESRD on PD, anion gap metabolic acidosis Management per nephrologist  Hypokalemia: Management per nephrology Resolved/   Hypertension: Continue with Norvasc  Anemia: hb stable at 9.6  Thrombocytopenia acute on chronic: improved.  Obstructive sleep apnea: Continue with CPAP\ Morbid obesity will need lifestyle modifications  Discharge Diagnoses:  Principal Problem:   Bacteremia due to Gram-positive bacteria Active Problems:   Morbid obesity ([place])   Hypertension   ESRD on peritoneal dialysis ([place])   Hypokalemia   Hyponatremia   Thrombocytopenia ([place])   Anemia of chronic disease   Bacteremia    Discharge Instructions  Discharge Instructions    [facility] pharmacist to adjust dose for Vancomycin, Aminoglycosides and other anti-infective therapies as requested by physician.   Complete by: As directed    [facility] to provide Cath Flo $Remove'2mg'iyTEBeX$    Complete by: As directed    Administer for PICC line occlusion and as ordered by physician for other access device issues.   Anaphylaxis Kit: Provided to treat any anaphylactic reaction to the medication being provided to the patient if First Dose or when requested by physician   Complete by: As directed    Epinephrine $RemoveBefo'1mg'QaAeWNMBlAB$ /ml vial / amp: Administer 0.$RemoveBeforeDEI'3mg'OGWWaqoJjhaKQxeU$  (0.64ml) subcutaneously once for  moderate to severe anaphylaxis, nurse to call physician and pharmacy when reaction occurs and call 911 if needed for immediate care    Diphenhydramine 50mg /ml IV vial: Administer 25-50mg  IV/IM PRN for first dose reaction, rash, itching, mild reaction, nurse to call physician and pharmacy when reaction occurs   Sodium Chloride 0.9% NS 544ml IV: Administer if needed for hypovolemic blood pressure drop or as ordered by physician after call to physician with anaphylactic reaction   Change dressing on IV access line weekly and PRN   Complete by: As directed    Flush IV access with Sodium Chloride 0.9% and Heparin 10 units/ml or 100 units/ml   Complete by: As directed    Home infusion instructions - [facility]   Complete by: As directed    Instructions: Flush IV access with Sodium Chloride 0.9% and Heparin 10units/ml or 100units/ml   Change dressing on IV access line: Weekly and PRN   Instructions Cath Flo 2mg : Administer for PICC Line occlusion and as ordered by physician for other access device   [facility] pharmacist to adjust dose for: Vancomycin, Aminoglycosides and other anti-infective therapies as requested by physician   Method of administration may be changed at the discretion of home infusion pharmacist based upon assessment of the patient and/or caregiver's ability to self-administer the medication ordered   Complete by: As directed    Outpatient Parenteral Antibiotic Therapy Information Antibiotic: Cefazolin (Ancef) IVPB; Indications for use: bacteremia; End Date: 03/04/2020   Complete by: As directed    Antibiotic: Cefazolin (Ancef) IVPB   Indications for use: bacteremia   End Date: 03/04/2020     Allergies as of 01/24/2020      Reactions   Lisinopril Swelling, Anaphylaxis   Angioedema Other reaction(s): Angioedema (ALLERGY/intolerance)   Losartan Swelling, Anaphylaxis      Medication List    STOP taking these medications   cephALEXin 250 MG capsule Commonly known as: KEFLEX     TAKE these medications   amLODipine 2.5 MG tablet Commonly known as: NORVASC Take 2.5 mg by mouth  daily.   Auryxia 1 GM 210 MG(Fe) tablet Generic drug: ferric citrate Take 840 mg by mouth 3 (three) times daily with meals.   calcitRIOL 0.25 MCG capsule Commonly known as: ROCALTROL Take 0.5 mcg by mouth daily.   ceFAZolin  IVPB Commonly known as: ANCEF Inject 1 g into the vein daily. Indication:  Endocarditis First Dose: Yes Last Day of Therapy:  03/04/20 Labs - Once weekly:  CBC/D and BMP, Labs - Every other week:  ESR and CRP Method of administration: IV Push Method of administration may be changed at the discretion of home infusion pharmacist based upon assessment of the patient and/or caregiver's ability to self-administer the medication ordered.   DIALYVITE 800 PO Take 1 tablet by mouth daily.   Elderberry 575 MG/5ML Syrp Take 3,450 mg by mouth every 4 (four) hours.   gentamicin cream 0.1 % Commonly known as: GARAMYCIN Apply 1 application topically as directed. Port site   Mapap 325 MG tablet Generic drug: acetaminophen Take 650 mg by mouth every 4 (four) hours as needed for mild pain.   senna-docusate 8.6-50 MG tablet Commonly known as: Senokot-S Take 1 tablet by mouth 2 (two) times daily.   SLEEPINAL MAXIMUM STRENGTH PO Take 1 tablet by mouth at bedtime.   triamcinolone cream 0.1 % Commonly known as: KENALOG Apply 1 application topically daily as needed (rash).  Discharge Care Instructions  (From admission, onward)         Start     Ordered   01/24/20 0000  Change dressing on IV access line weekly and PRN  (Home infusion instructions - [facility] )        01/24/20 0947          Allergies  Allergen Reactions  . Lisinopril Swelling and Anaphylaxis    Angioedema  Other reaction(s): Angioedema (ALLERGY/intolerance)  . Losartan Swelling and Anaphylaxis    Consultations:  Nephrology  ID   Procedures/Studies: IR Removal Tun Cv Cath W/O FL  Result Date: 01/21/2020 INDICATION: Patient history of ESRD on HD via  right IJ tunneled HD catheter placed in OR 10/2017. Patient now tolerating PD without difficulties. Request is made for removal of tunneled HD catheter. EXAM: REMOVAL OF TUNNELED HEMODIALYSIS CATHETER MEDICATIONS: 15 mL 1% lidocaine COMPLICATIONS: None immediate. PROCEDURE: Informed written consent was obtained from the patient following an explanation of the procedure, risks, benefits and alternatives to treatment. A time out was performed prior to the initiation of the procedure. Maximal barrier sterile technique was utilized including caps, mask, sterile gowns, sterile gloves, large sterile drape, hand hygiene, and Hibiclens. 1% lidocaine was injected under sterile conditions along the subcutaneous tunnel. Utilizing a combination of blunt dissection and gentle traction, the catheter was removed intact. Hemostasis was obtained with manual compression. A dressing was placed. The patient tolerated the procedure well without immediate post procedural complication. IMPRESSION: Successful removal of tunneled dialysis catheter. Read by: Earley Abide, PA-C Electronically Signed   By: Corrie Mckusick D.O.   On: 01/21/2020 16:28   ECHOCARDIOGRAM COMPLETE  Result Date: 01/20/2020    ECHOCARDIOGRAM REPORT   Patient Name:   DELDRICK LINCH Date of Exam: 01/20/2020 Medical Rec #:  [id number]             Height:       67.0 in Accession #:    [id number]            Weight:       274.0 lb Date of Birth:  [birth year]             BSA:          2.312 m Patient Age:    49 years              BP:           112/61 mmHg Patient Gender: M                     HR:           90 bpm. Exam Location:  Inpatient Procedure: 2D Echo and Intracardiac Opacification Agent Indications:    Bacteremia  History:        Patient has prior history of Echocardiogram examinations, most                 recent 06/19/2013. Risk Factors:Hypertension and Dyslipidemia.  Sonographer:    Mikki Santee RDCS (AE) Referring Phys: [id number] Michell Heinrich PAHWANI IMPRESSIONS   1. Abnormal septal motion . Left ventricular ejection fraction, by estimation, is 50 to 55%. The left ventricle has low normal function. The left ventricle has no regional wall motion abnormalities. There is mild left ventricular hypertrophy. Left ventricular diastolic parameters were normal.  2. Right ventricular systolic function is normal. The right ventricular size is normal.  3. The mitral valve is normal in structure. No evidence of mitral  valve regurgitation. No evidence of mitral stenosis.  4. The aortic valve is normal in structure. Aortic valve regurgitation is not visualized. No aortic stenosis is present.  5. The inferior vena cava is normal in size with greater than 50% respiratory variability, suggesting right atrial pressure of 3 mmHg. FINDINGS  Left Ventricle: Abnormal septal motion. Left ventricular ejection fraction, by estimation, is 50 to 55%. The left ventricle has low normal function. The left ventricle has no regional wall motion abnormalities. Definity contrast agent was given IV to delineate the left ventricular endocardial borders. The left ventricular internal cavity size was normal in size. There is mild left ventricular hypertrophy. Left ventricular diastolic parameters were normal. Right Ventricle: The right ventricular size is normal. No increase in right ventricular wall thickness. Right ventricular systolic function is normal. Left Atrium: Left atrial size was normal in size. Right Atrium: Right atrial size was normal in size. Pericardium: There is no evidence of pericardial effusion. Mitral Valve: The mitral valve is normal in structure. Normal mobility of the mitral valve leaflets. No evidence of mitral valve regurgitation. No evidence of mitral valve stenosis. Tricuspid Valve: The tricuspid valve is normal in structure. Tricuspid valve regurgitation is trivial. No evidence of tricuspid stenosis. Aortic Valve: The aortic valve is normal in structure. Aortic valve regurgitation is  not visualized. No aortic stenosis is present. Pulmonic Valve: The pulmonic valve was not well visualized. Pulmonic valve regurgitation is not visualized. No evidence of pulmonic stenosis. Aorta: The aortic root is normal in size and structure. Venous: The inferior vena cava is normal in size with greater than 50% respiratory variability, suggesting right atrial pressure of 3 mmHg. IAS/Shunts: No atrial level shunt detected by color flow Doppler.  LEFT VENTRICLE PLAX 2D LVIDd:         4.40 cm  Diastology LVIDs:         3.00 cm  LV e' lateral:   11.50 cm/s LV PW:         1.30 cm  LV E/e' lateral: 6.5 LV IVS:        1.30 cm  LV e' medial:    10.20 cm/s LVOT diam:     2.50 cm  LV E/e' medial:  7.3 LV SV:         86 LV SV Index:   37 LVOT Area:     4.91 cm  RIGHT VENTRICLE RV S prime:     13.30 cm/s TAPSE (M-mode): 2.1 cm LEFT ATRIUM             Index       RIGHT ATRIUM           Index LA diam:        3.60 cm 1.56 cm/m  RA Area:     17.00 cm LA Vol (A2C):   41.1 ml 17.78 ml/m RA Volume:   46.00 ml  19.90 ml/m LA Vol (A4C):   34.6 ml 14.97 ml/m LA Biplane Vol: 38.2 ml 16.52 ml/m  AORTIC VALVE LVOT Vmax:   94.50 cm/s LVOT Vmean:  64.400 cm/s LVOT VTI:    0.176 m  AORTA Ao Root diam: 3.70 cm MITRAL VALVE MV Area (PHT): 4.86 cm    SHUNTS MV Decel Time: 156 msec    Systemic VTI:  0.18 m MV E velocity: 74.90 cm/s  Systemic Diam: 2.50 cm MV A velocity: 67.30 cm/s MV E/A ratio:  1.11 Jenkins Rouge MD Electronically signed by Jenkins Rouge MD Signature Date/Time: 01/20/2020/3:47:50 PM  Final    ECHO TEE  Result Date: 01/22/2020    TRANSESOPHOGEAL ECHO REPORT   Patient Name:   YISROEL MULLENDORE Date of Exam: 01/22/2020 Medical Rec #:  [id number]             Height:       67.0 in Accession #:    [id number]            Weight:       286.6 lb Date of Birth:  [birth year]             BSA:          2.356 m Patient Age:    51 years              BP:           162/89 mmHg Patient Gender: M                     HR:           95 bpm.  Exam Location:  Inpatient Procedure: Transesophageal Echo, Color Doppler and Cardiac Doppler Indications:     Bacteremia 790.7 / R78.81  History:         Patient has prior history of Echocardiogram examinations. Risk                  Factors:Hypertension and Sleep Apnea. End stage renal diseae,                  Anemia, thrombocytopenia.  Sonographer:     Darlina Sicilian RDCS Referring Phys:  [id number] [place] Diagnosing Phys: Lyman Bishop MD  Sonographer Comments: Technically difficult study due to poor echo windows. Image acquisition challenging due to patient body habitus. PROCEDURE: The transesophogeal probe was passed without difficulty through the esophogus of the patient. Sedation performed by different physician. The patient was monitored while under deep sedation. Anesthestetic sedation was provided intravenously by Anesthesiology: 217mg  of Propofol. The patient developed no complications during the procedure. IMPRESSIONS  1. Left ventricular ejection fraction, by estimation, is 50 to 55%. The left ventricle has low normal function. There is mild left ventricular hypertrophy.  2. Right ventricular systolic function is normal. The right ventricular size is normal.  3. No left atrial/left atrial appendage thrombus was detected.  4. The mitral valve is grossly normal. Trivial mitral valve regurgitation.  5. The aortic valve is tricuspid. Aortic valve regurgitation is not visualized.  6. 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 The pulmonic valve was abnormal. Pulmonic valve regurgitation is moderate. Conclusion(s)/Recommendation(s): Findings are concerning for vegetation/infective endocarditis as detailed above. FINDINGS  Left Ventricle: Left ventricular ejection fraction, by estimation, is 50 to 55%. The left ventricle has low normal function. The left ventricular internal cavity size was normal in size. There is mild left ventricular hypertrophy. Right Ventricle: The right ventricular size is  normal. No increase in right ventricular wall thickness. Right ventricular systolic function is normal. Left Atrium: Left atrial size was normal in size. No left atrial/left atrial appendage thrombus was detected. Right Atrium: Right atrial size was normal in size. Pericardium: There is no evidence of pericardial effusion. Mitral Valve: The mitral valve is grossly normal. Trivial mitral valve regurgitation. Tricuspid Valve: The tricuspid valve is grossly normal. Tricuspid valve regurgitation is trivial. Aortic Valve: The aortic valve is tricuspid. Aortic valve regurgitation is not visualized. Pulmonic Valve: Large mobile 1 x 1.5 cm mass consistent with vegetation. The pulmonic  valve was abnormal. Pulmonic valve regurgitation is moderate. Aorta: The aortic root and ascending aorta are structurally normal, with no evidence of dilitation. IAS/Shunts: No atrial level shunt detected by color flow Doppler. Lyman Bishop MD Electronically signed by Lyman Bishop MD Signature Date/Time: 01/22/2020/3:16:54 PM    Final       Subjective: He denies abdominal pain. He is ready to go home. He had some complaints about timing for procedure, he needs to be disconnected from PD machine, he has been waiting for HD nurse.  Discharge Exam: Vitals:   01/24/20 0435 01/24/20 0903  BP: (!) 152/85 138/82  Pulse: 94 89  Resp: 18 18  Temp: 98.8 F (37.1 C) 98.2 F (36.8 C)  SpO2: 97% 96%     General: Pt is alert, awake, not in acute distress Cardiovascular: RRR, S1/S2 +, no rubs, no gallops Respiratory: CTA bilaterally, no wheezing, no rhonchi Abdominal: Soft, NT, ND, bowel sounds + Extremities: no edema, no cyanosis    The results of significant diagnostics from this hospitalization (including imaging, microbiology, ancillary and laboratory) are listed below for reference.     Microbiology: Recent Results (from the past 240 hour(s))  Blood culture (routine x 2)     Status: Abnormal   Collection Time: 01/20/20   1:07 PM   Specimen: BLOOD LEFT HAND  Result Value Ref Range Status   Specimen Description BLOOD LEFT HAND  Final   Special Requests   Final    BOTTLES DRAWN AEROBIC ONLY Blood Culture results may not be optimal due to an inadequate volume of blood received in culture bottles   Culture  Setup Time   Final    GRAM POSITIVE COCCI IN CLUSTERS AEROBIC BOTTLE ONLY CRITICAL RESULT CALLED TO, READ BACK BY AND VERIFIED WITH: SARAH MILLS PHARMD $RemoveBefo'@1804'[id number]$  01/21/20 EB Performed at [facility], [place] [address] [place], [place] [address]    Culture STAPHYLOCOCCUS LUGDUNENSIS (A)  Final   Report Status 01/23/2020 FINAL  Final   Organism ID, Bacteria STAPHYLOCOCCUS LUGDUNENSIS  Final      Susceptibility   Staphylococcus lugdunensis - MIC*    CIPROFLOXACIN <=0.5 SENSITIVE Sensitive     ERYTHROMYCIN <=0.25 SENSITIVE Sensitive     GENTAMICIN <=0.5 SENSITIVE Sensitive     OXACILLIN 1 SENSITIVE Sensitive     TETRACYCLINE <=1 SENSITIVE Sensitive     VANCOMYCIN <=0.5 SENSITIVE Sensitive     TRIMETH/SULFA <=10 SENSITIVE Sensitive     CLINDAMYCIN <=0.25 SENSITIVE Sensitive     RIFAMPIN <=0.5 SENSITIVE Sensitive     Inducible Clindamycin NEGATIVE Sensitive     * STAPHYLOCOCCUS LUGDUNENSIS  Blood Culture ID Panel (Reflexed)     Status: Abnormal   Collection Time: 01/20/20  1:07 PM  Result Value Ref Range Status   Enterococcus faecalis NOT DETECTED NOT DETECTED Final   Enterococcus Faecium NOT DETECTED NOT DETECTED Final   Listeria monocytogenes NOT DETECTED NOT DETECTED Final   Staphylococcus species DETECTED (A) NOT DETECTED Final    Comment: CRITICAL RESULT CALLED TO, READ BACK BY AND VERIFIED WITH: S,MILLS PHARMD $RemoveBefore'@1804'tNwlhHZiQZCgt$  01/21/20 EB    Staphylococcus aureus (BCID) NOT DETECTED NOT DETECTED Final   Staphylococcus epidermidis NOT DETECTED NOT DETECTED Final   Staphylococcus lugdunensis DETECTED (A) NOT DETECTED Final    Comment: CRITICAL RESULT CALLED TO, READ BACK BY AND VERIFIED WITH: S,MILLS PHARMD  $Remov'@1804'NoFuPF$  01/21/20 EB    Streptococcus species NOT DETECTED NOT DETECTED Final   Streptococcus agalactiae NOT DETECTED NOT DETECTED Final   Streptococcus  pneumoniae NOT DETECTED NOT DETECTED Final   Streptococcus pyogenes NOT DETECTED NOT DETECTED Final   A.calcoaceticus-baumannii NOT DETECTED NOT DETECTED Final   Bacteroides fragilis NOT DETECTED NOT DETECTED Final   Enterobacterales NOT DETECTED NOT DETECTED Final   Enterobacter cloacae complex NOT DETECTED NOT DETECTED Final   Escherichia coli NOT DETECTED NOT DETECTED Final   Klebsiella aerogenes NOT DETECTED NOT DETECTED Final   Klebsiella oxytoca NOT DETECTED NOT DETECTED Final   Klebsiella pneumoniae NOT DETECTED NOT DETECTED Final   Proteus species NOT DETECTED NOT DETECTED Final   Salmonella species NOT DETECTED NOT DETECTED Final   Serratia marcescens NOT DETECTED NOT DETECTED Final   Haemophilus influenzae NOT DETECTED NOT DETECTED Final   Neisseria meningitidis NOT DETECTED NOT DETECTED Final   Pseudomonas aeruginosa NOT DETECTED NOT DETECTED Final   Stenotrophomonas maltophilia NOT DETECTED NOT DETECTED Final   Candida albicans NOT DETECTED NOT DETECTED Final   Candida auris NOT DETECTED NOT DETECTED Final   Candida glabrata NOT DETECTED NOT DETECTED Final   Candida krusei NOT DETECTED NOT DETECTED Final   Candida parapsilosis NOT DETECTED NOT DETECTED Final   Candida tropicalis NOT DETECTED NOT DETECTED Final   Cryptococcus neoformans/gattii NOT DETECTED NOT DETECTED Final   Methicillin resistance mecA/C NOT DETECTED NOT DETECTED Final    Comment: Performed at [facility], [address], [place], [place] [address]  SARS Coronavirus 2 by RT PCR (hospital order, performed in [facility] hospital lab) Nasopharyngeal Nasopharyngeal Swab     Status: None   Collection Time: 01/20/20  1:12 PM   Specimen: Nasopharyngeal Swab  Result Value Ref Range Status   SARS Coronavirus 2 NEGATIVE NEGATIVE Final    Comment:  (NOTE) SARS-CoV-2 target nucleic acids are NOT DETECTED.  The SARS-CoV-2 RNA is generally detectable in upper and lower respiratory specimens during the acute phase of infection. The lowest concentration of SARS-CoV-2 viral copies this assay can detect is 250 copies / mL. A negative result does not preclude SARS-CoV-2 infection and should not be used as the sole basis for treatment or other patient management decisions.  A negative result may occur with improper specimen collection / handling, submission of specimen other than nasopharyngeal swab, presence of viral mutation(s) within the areas targeted by this assay, and inadequate number of viral copies (<250 copies / mL). A negative result must be combined with clinical observations, patient history, and epidemiological information.  Fact Sheet for Patients:   StrictlyIdeas.no  Fact Sheet for Healthcare Providers: BankingDealers.co.za  This test is not yet approved or  cleared by the [place] FDA and has been authorized for detection and/or diagnosis of SARS-CoV-2 by FDA under an Emergency Use Authorization (EUA).  This EUA will remain in effect (meaning this test can be used) for the duration of the COVID-19 declaration under Section 564(b)(1) of the Act, 21 U.S.C. section 360bbb-3(b)(1), unless the authorization is terminated or revoked sooner.  Performed at [facility], [place] [address], [place], [place] [address]   Body fluid culture     Status: None (Preliminary result)   Collection Time: 01/20/20  6:15 PM   Specimen: Peritoneal Washings; Body Fluid  Result Value Ref Range Status   Specimen Description PERITONEAL FLUID  Final   Special Requests PERITONEAL DIALYSIS  Final   Gram Stain   Final    WBC PRESENT,BOTH PMN AND MONONUCLEAR NO ORGANISMS SEEN CYTOSPIN SMEAR    Culture   Final    NO GROWTH 3 DAYS  Performed at [facility], [place] [address]  [place], [place] [address]    Report Status PENDING  Incomplete  Blood culture (routine x 2)     Status: Abnormal   Collection Time: 01/20/20  7:31 PM   Specimen: BLOOD LEFT HAND  Result Value Ref Range Status   Specimen Description BLOOD LEFT HAND  Final   Special Requests   Final    BOTTLES DRAWN AEROBIC ONLY Blood Culture results may not be optimal due to an inadequate volume of blood received in culture bottles   Culture  Setup Time   Final    AEROBIC BOTTLE ONLY GRAM POSITIVE COCCI IN CLUSTERS CRITICAL RESULT CALLED TO, READ BACK BY AND VERIFIED WITH: L SEAY PHARMD 01/22/20 0223 JDW    Culture (A)  Final    STAPHYLOCOCCUS LUGDUNENSIS SUSCEPTIBILITIES PERFORMED ON PREVIOUS CULTURE WITHIN THE LAST 5 DAYS. Performed at [facility], [place] [address], [place], [place] [address]    Report Status 01/23/2020 FINAL  Final  Culture, blood (Routine X 2) w Reflex to ID Panel     Status: None (Preliminary result)   Collection Time: 01/22/20  7:28 AM   Specimen: BLOOD LEFT ARM  Result Value Ref Range Status   Specimen Description BLOOD LEFT ARM  Final   Special Requests   Final    BOTTLES DRAWN AEROBIC ONLY Blood Culture results may not be optimal due to an inadequate volume of blood received in culture bottles   Culture   Final    NO GROWTH 2 DAYS Performed at [facility], [place] [address] [place], [place] [address]    Report Status PENDING  Incomplete  Culture, blood (Routine X 2) w Reflex to ID Panel     Status: None (Preliminary result)   Collection Time: 01/22/20  7:29 AM   Specimen: BLOOD LEFT HAND  Result Value Ref Range Status   Specimen Description BLOOD LEFT HAND  Final   Special Requests   Final    BOTTLES DRAWN AEROBIC ONLY Blood Culture results may not be optimal due to an inadequate volume of blood received in culture bottles   Culture   Final    NO GROWTH 2 DAYS Performed at [facility], [place] [address] [place],  [address]    Report Status PENDING   Incomplete     Labs: BNP (last 3 results) No results for input(s): BNP in the last 8760 hours. Basic Metabolic Panel: Recent Labs  Lab 01/20/20 0956 01/20/20 1935 01/21/20 0808 01/22/20 0729 01/23/20 0407  NA 128*  --  130* 130* 130*  K 3.0*  --  3.5 3.4* 3.2*  CL 89*  --  91* 90* 90*  CO2 18*  --  19* 19* 20*  GLUCOSE 141*  --  129* 128* 121*  BUN 78*  --  77* 77* 79*  CREATININE 16.98*  --  16.52* 16.30* 16.14*  CALCIUM 7.7*  --  7.7* 8.0* 8.2*  MG  --  2.0  --   --   --   PHOS  --   --   --  6.0* 6.9*   Liver Function Tests: Recent Labs  Lab 01/20/20 0956 01/21/20 0808 01/22/20 0729 01/23/20 0407  AST 28 24  --   --   ALT 44 42  --   --   ALKPHOS 99 95  --   --   BILITOT 0.5 0.5  --   --   PROT 6.7  6.6  --   --   ALBUMIN 2.4* 2.5* 2.7* 2.5*   No results for input(s): LIPASE, AMYLASE in the last 168 hours. No results for input(s): AMMONIA in the last 168 hours. CBC: Recent Labs  Lab 01/20/20 0956 01/21/20 0808 01/22/20 0729 01/23/20 0407  WBC 18.1* 17.2* 15.1* 15.1*  NEUTROABS 13.4*  --  11.0* 11.2*  HGB 9.9* 9.5* 9.5* 9.4*  HCT 30.9* 29.3* 29.8* 28.9*  MCV 88.0 87.2 88.7 89.2  PLT 44* 52* 70* 81*   Cardiac Enzymes: No results for input(s): CKTOTAL, CKMB, CKMBINDEX, TROPONINI in the last 168 hours. BNP: Invalid input(s): POCBNP CBG: No results for input(s): GLUCAP in the last 168 hours. D-Dimer No results for input(s): DDIMER in the last 72 hours. Hgb A1c No results for input(s): HGBA1C in the last 72 hours. Lipid Profile No results for input(s): CHOL, HDL, LDLCALC, TRIG, CHOLHDL, LDLDIRECT in the last 72 hours. Thyroid function studies No results for input(s): TSH, T4TOTAL, T3FREE, THYROIDAB in the last 72 hours.  Invalid input(s): FREET3 Anemia work up No results for input(s): VITAMINB12, FOLATE, FERRITIN, TIBC, IRON, RETICCTPCT in the last 72 hours. Urinalysis    Component Value Date/Time   COLORURINE STRAW (A) 11/02/2017 2208    APPEARANCEUR CLEAR 11/02/2017 2208   LABSPEC 1.011 11/02/2017 2208   PHURINE 5.0 11/02/2017 2208   GLUCOSEU 50 (A) 11/02/2017 2208   HGBUR NEGATIVE 11/02/2017 2208   BILIRUBINUR NEGATIVE 11/02/2017 2208   KETONESUR 5 (A) 11/02/2017 2208   PROTEINUR 100 (A) 11/02/2017 2208   UROBILINOGEN 0.2 09/15/2009 2240   NITRITE NEGATIVE 11/02/2017 2208   LEUKOCYTESUR NEGATIVE 11/02/2017 2208   Sepsis Labs Invalid input(s): PROCALCITONIN,  WBC,  LACTICIDVEN Microbiology Recent Results (from the past 240 hour(s))  Blood culture (routine x 2)     Status: Abnormal   Collection Time: 01/20/20  1:07 PM   Specimen: BLOOD LEFT HAND  Result Value Ref Range Status   Specimen Description BLOOD LEFT HAND  Final   Special Requests   Final    BOTTLES DRAWN AEROBIC ONLY Blood Culture results may not be optimal due to an inadequate volume of blood received in culture bottles   Culture  Setup Time   Final    GRAM POSITIVE COCCI IN CLUSTERS AEROBIC BOTTLE ONLY CRITICAL RESULT CALLED TO, READ BACK BY AND VERIFIED WITH: [place] PHARMD $RemoveBefore'@1804'[id number]$  01/21/20 EB Performed at [facility], [place] [address], [place], [place] [address]    Culture STAPHYLOCOCCUS LUGDUNENSIS (A)  Final   Report Status 01/23/2020 FINAL  Final   Organism ID, Bacteria STAPHYLOCOCCUS LUGDUNENSIS  Final      Susceptibility   Staphylococcus lugdunensis - MIC*    CIPROFLOXACIN <=0.5 SENSITIVE Sensitive     ERYTHROMYCIN <=0.25 SENSITIVE Sensitive     GENTAMICIN <=0.5 SENSITIVE Sensitive     OXACILLIN 1 SENSITIVE Sensitive     TETRACYCLINE <=1 SENSITIVE Sensitive     VANCOMYCIN <=0.5 SENSITIVE Sensitive     TRIMETH/SULFA <=10 SENSITIVE Sensitive     CLINDAMYCIN <=0.25 SENSITIVE Sensitive     RIFAMPIN <=0.5 SENSITIVE Sensitive     Inducible Clindamycin NEGATIVE Sensitive     * STAPHYLOCOCCUS LUGDUNENSIS  Blood Culture ID Panel (Reflexed)     Status: Abnormal   Collection Time: 01/20/20  1:07 PM  Result Value Ref Range Status    Enterococcus faecalis NOT DETECTED NOT DETECTED Final   Enterococcus Faecium NOT DETECTED NOT DETECTED Final   Listeria monocytogenes NOT DETECTED NOT DETECTED Final  Staphylococcus species DETECTED (A) NOT DETECTED Final    Comment: CRITICAL RESULT CALLED TO, READ BACK BY AND VERIFIED WITH: S,MILLS PHARMD $RemoveBefore'@1804'[id number]$  01/21/20 EB    Staphylococcus aureus (BCID) NOT DETECTED NOT DETECTED Final   Staphylococcus epidermidis NOT DETECTED NOT DETECTED Final   Staphylococcus lugdunensis DETECTED (A) NOT DETECTED Final    Comment: CRITICAL RESULT CALLED TO, READ BACK BY AND VERIFIED WITH: S,MILLS PHARMD $RemoveBefore'@1804'[id number]$  01/21/20 EB    Streptococcus species NOT DETECTED NOT DETECTED Final   Streptococcus agalactiae NOT DETECTED NOT DETECTED Final   Streptococcus pneumoniae NOT DETECTED NOT DETECTED Final   Streptococcus pyogenes NOT DETECTED NOT DETECTED Final   A.calcoaceticus-baumannii NOT DETECTED NOT DETECTED Final   Bacteroides fragilis NOT DETECTED NOT DETECTED Final   Enterobacterales NOT DETECTED NOT DETECTED Final   Enterobacter cloacae complex NOT DETECTED NOT DETECTED Final   Escherichia coli NOT DETECTED NOT DETECTED Final   Klebsiella aerogenes NOT DETECTED NOT DETECTED Final   Klebsiella oxytoca NOT DETECTED NOT DETECTED Final   Klebsiella pneumoniae NOT DETECTED NOT DETECTED Final   Proteus species NOT DETECTED NOT DETECTED Final   Salmonella species NOT DETECTED NOT DETECTED Final   Serratia marcescens NOT DETECTED NOT DETECTED Final   Haemophilus influenzae NOT DETECTED NOT DETECTED Final   Neisseria meningitidis NOT DETECTED NOT DETECTED Final   Pseudomonas aeruginosa NOT DETECTED NOT DETECTED Final   Stenotrophomonas maltophilia NOT DETECTED NOT DETECTED Final   Candida albicans NOT DETECTED NOT DETECTED Final   Candida auris NOT DETECTED NOT DETECTED Final   Candida glabrata NOT DETECTED NOT DETECTED Final   Candida krusei NOT DETECTED NOT DETECTED Final   Candida parapsilosis NOT  DETECTED NOT DETECTED Final   Candida tropicalis NOT DETECTED NOT DETECTED Final   Cryptococcus neoformans/gattii NOT DETECTED NOT DETECTED Final   Methicillin resistance mecA/C NOT DETECTED NOT DETECTED Final    Comment: Performed at [facility], [address], [place], [place] [address]  SARS Coronavirus 2 by RT PCR (hospital order, performed in [facility] hospital lab) Nasopharyngeal Nasopharyngeal Swab     Status: None   Collection Time: 01/20/20  1:12 PM   Specimen: Nasopharyngeal Swab  Result Value Ref Range Status   SARS Coronavirus 2 NEGATIVE NEGATIVE Final    Comment: (NOTE) SARS-CoV-2 target nucleic acids are NOT DETECTED.  The SARS-CoV-2 RNA is generally detectable in upper and lower respiratory specimens during the acute phase of infection. The lowest concentration of SARS-CoV-2 viral copies this assay can detect is 250 copies / mL. A negative result does not preclude SARS-CoV-2 infection and should not be used as the sole basis for treatment or other patient management decisions.  A negative result may occur with improper specimen collection / handling, submission of specimen other than nasopharyngeal swab, presence of viral mutation(s) within the areas targeted by this assay, and inadequate number of viral copies (<250 copies / mL). A negative result must be combined with clinical observations, patient history, and epidemiological information.  Fact Sheet for Patients:   StrictlyIdeas.no  Fact Sheet for Healthcare Providers: BankingDealers.co.za  This test is not yet approved or  cleared by the [place] FDA and has been authorized for detection and/or diagnosis of SARS-CoV-2 by FDA under an Emergency Use Authorization (EUA).  This EUA will remain in effect (meaning this test can be used) for the duration of the COVID-19 declaration under Section 564(b)(1) of the Act, 21 U.S.C. section 360bbb-3(b)(1), unless  the authorization is terminated or revoked sooner.  Performed at [facility], [place] [address], [place], [place] [address]   Body fluid culture     Status: None (Preliminary result)   Collection Time: 01/20/20  6:15 PM   Specimen: Peritoneal Washings; Body Fluid  Result Value Ref Range Status   Specimen Description PERITONEAL FLUID  Final   Special Requests PERITONEAL DIALYSIS  Final   Gram Stain   Final    WBC PRESENT,BOTH PMN AND MONONUCLEAR NO ORGANISMS SEEN CYTOSPIN SMEAR    Culture   Final    NO GROWTH 3 DAYS Performed at [facility], [address] [place], [place] [address]    Report Status PENDING  Incomplete  Blood culture (routine x 2)     Status: Abnormal   Collection Time: 01/20/20  7:31 PM   Specimen: BLOOD LEFT HAND  Result Value Ref Range Status   Specimen Description BLOOD LEFT HAND  Final   Special Requests   Final    BOTTLES DRAWN AEROBIC ONLY Blood Culture results may not be optimal due to an inadequate volume of blood received in culture bottles   Culture  Setup Time   Final    AEROBIC BOTTLE ONLY GRAM POSITIVE COCCI IN CLUSTERS CRITICAL RESULT CALLED TO, READ BACK BY AND VERIFIED WITH: L SEAY PHARMD 01/22/20 0223 JDW    Culture (A)  Final    STAPHYLOCOCCUS LUGDUNENSIS SUSCEPTIBILITIES PERFORMED ON PREVIOUS CULTURE WITHIN THE LAST 5 DAYS. Performed at [facility], [place] [address], [place], [place] [address]    Report Status 01/23/2020 FINAL  Final  Culture, blood (Routine X 2) w Reflex to ID Panel     Status: None (Preliminary result)   Collection Time: 01/22/20  7:28 AM   Specimen: BLOOD LEFT ARM  Result Value Ref Range Status   Specimen Description BLOOD LEFT ARM  Final   Special Requests   Final    BOTTLES DRAWN AEROBIC ONLY Blood Culture results may not be optimal due to an inadequate volume of blood received in culture bottles   Culture   Final    NO GROWTH 2 DAYS Performed at [facility], [place] [address],  [place], [place] [address]    Report Status PENDING  Incomplete  Culture, blood (Routine X 2) w Reflex to ID Panel     Status: None (Preliminary result)   Collection Time: 01/22/20  7:29 AM   Specimen: BLOOD LEFT HAND  Result Value Ref Range Status   Specimen Description BLOOD LEFT HAND  Final   Special Requests   Final    BOTTLES DRAWN AEROBIC ONLY Blood Culture results may not be optimal due to an inadequate volume of blood received in culture bottles   Culture   Final    NO GROWTH 2 DAYS Performed at [facility], [place] [address] [place], [place] [address]    Report Status PENDING  Incomplete     Time coordinating discharge: 40 minutes  SIGNED:   Elmarie Shiley, MD  [facility]

## 2020-01-24 NOTE — Procedures (Signed)
Interventional Radiology Procedure Note  Procedure: Left IJ tunneled PowerLine  Complications: None  Estimated Blood Loss: None  Recommendations:  - Routine line care   Signed,  Criselda Peaches, MD

## 2020-01-24 NOTE — TOC Initial Note (Signed)
Transition of Care [facility]) - Initial/Assessment Note    Patient Details  Name: Adrian Frye MRN: [id number] Date of Birth: 1970-07-31  Transition [facility] CM/SW Contact:    Bartholomew Crews, RN Phone Number:  [phone] 01/24/2020, 2:13 PM  Clinical Narrative:                  Spoke with patient and spouse at the bedside. Discussed plans to transition home with IV antibiotics. Referral sent to [facility] for IV antibiotics. Pam with [facility] to provide teaching at the bedside prior to discharge. Offered choice of [place] agency - referral pending with [facility]. TOC following for transition needs.   Expected Discharge Plan: [place] Barriers to Discharge: No Barriers Identified   Patient Goals and CMS Choice Patient states their goals for this hospitalization and ongoing recovery are:: return home with wife CMS Medicare.gov Compare Post Acute Care list provided to:: Patient Choice offered to / list presented to : Patient, Spouse  Expected Discharge Plan and Services Expected Discharge Plan: [place] In-house Referral: NA Discharge Planning Services: CM Consult Post Acute Care Choice: [place] arrangements for the past 2 months: Single Family Home Expected Discharge Date: 01/24/20               DME Arranged: N/A DME Agency: NA       HH Arranged: RN   Date HH Agency Contacted: 01/24/20 Time [place]: 1343 Representative spoke with at [place]: Butch Penny - referral pending  Prior Living Arrangements/Services Living arrangements for the past 2 months: Single Family Home Lives with:: Self, Spouse Patient language and need for interpreter reviewed:: Yes Do you feel safe going back to the place where you live?: Yes      Need for Family Participation in Patient Care: No (Comment)     Criminal Activity/Legal Involvement Pertinent to Current Situation/Hospitalization: No - Comment as needed  Activities of  Daily Living Home Assistive Devices/Equipment: Eyeglasses ADL Screening (condition at time of admission) Patient's cognitive ability adequate to safely complete daily activities?: Yes Is the patient deaf or have difficulty hearing?: No Does the patient have difficulty seeing, even when wearing glasses/contacts?: No Does the patient have difficulty concentrating, remembering, or making decisions?: No Patient able to express need for assistance with ADLs?: Yes Does the patient have difficulty dressing or bathing?: No Independently performs ADLs?: Yes (appropriate for developmental age) Does the patient have difficulty walking or climbing stairs?: Yes Weakness of Legs: None Weakness of Arms/Hands: None  Permission Sought/Granted Permission sought to share information with : Family Supports          Permission granted to share info w Relationship: spouse     Emotional Assessment Appearance:: Appears stated age Attitude/Demeanor/Rapport: Engaged Affect (typically observed): Accepting Orientation: : Oriented to Self, Oriented to  Time, Oriented to Place, Oriented to Situation Alcohol / Substance Use: Not Applicable Psych Involvement: No (comment)  Admission diagnosis:  Bacteremia [R78.81] Thrombocytopenia ([place]) [D69.6] ESRD on peritoneal dialysis ([place]) [N18.6, Z99.2] Decreased urine output [R34] Patient Active Problem List   Diagnosis Date Noted  . Bacteremia 01/20/2020  . Hyperlipidemia   . Hypokalemia   . Hyponatremia   . Thrombocytopenia ([place])   . Bacteremia due to Gram-positive bacteria   . Anemia of chronic disease   . History of asthma 11/19/2019  . CHF (congestive heart failure) ([place])   . Encounter for hemodialysis for ESRD ([place]) 11/08/2017  . ESRD  on peritoneal dialysis ([place]) 11/08/2017  . Acute prerenal azotemia 11/02/2017  . Chronic diastolic heart failure ([place]) 04/17/2013  . Edema   . Hypertension   . Dilated idiopathic cardiomyopathy ([place])   . Morbid obesity  ([place]) 08/05/2012  . ASTHMA 10/28/2007  . ALLERGIC RHINITIS 10/18/2007  . Obstructive sleep apnea 10/18/2007   PCP:  Girtha Rm, NP-C Pharmacy:   [id number] - [place], [place] [address] [place] [address] Phone: [phone] Fax: [phone]     Social Determinants of Health (SDOH) Interventions    Readmission Risk Interventions No flowsheet data found.

## 2020-01-24 NOTE — Progress Notes (Signed)
[facility] Progress Note   Subjective:  LIJ Tunneled central line placed in IR today. No issues with PD overnight. Wants to go home.   Objective Vitals:   01/23/20 1707 01/23/20 2040 01/24/20 0435 01/24/20 0903  BP: 134/76  (!) 152/85 138/82  Pulse: 68  94 89  Resp: 18  18 18   Temp: 98.7 F (37.1 C)  98.8 F (37.1 C) 98.2 F (36.8 C)  TempSrc: Oral     SpO2: 97%  97% 96%  Weight:  129.2 kg    Height:       Physical Exam General: obese male in NAD Heart: HS distant D/T body habitus. S1,S2 RRR Lungs: CTAB Abdomen: Obese, active BS. PD cath LLQ drsg intact.  Extremities: Trance RLE edema, no LLE Dialysis Access: LLQ PD catheter LIJ central tunneled single lumen central line drsg intact    Additional Objective Labs: Basic Metabolic Panel: Recent Labs  Lab 01/21/20 0808 01/22/20 0729 01/23/20 0407  NA 130* 130* 130*  K 3.5 3.4* 3.2*  CL 91* 90* 90*  CO2 19* 19* 20*  GLUCOSE 129* 128* 121*  BUN 77* 77* 79*  CREATININE 16.52* 16.30* 16.14*  CALCIUM 7.7* 8.0* 8.2*  PHOS  --  6.0* 6.9*   Liver Function Tests: Recent Labs  Lab 01/20/20 0956 01/20/20 0956 01/21/20 0808 01/22/20 0729 01/23/20 0407  AST 28  --  24  --   --   ALT 44  --  42  --   --   ALKPHOS 99  --  95  --   --   BILITOT 0.5  --  0.5  --   --   PROT 6.7  --  6.6  --   --   ALBUMIN 2.4*   < > 2.5* 2.7* 2.5*   < > = values in this interval not displayed.   No results for input(s): LIPASE, AMYLASE in the last 168 hours. CBC: Recent Labs  Lab 01/20/20 0956 01/20/20 0956 01/21/20 0808 01/22/20 0729 01/23/20 0407  WBC 18.1*   < > 17.2* 15.1* 15.1*  NEUTROABS 13.4*  --   --  11.0* 11.2*  HGB 9.9*   < > 9.5* 9.5* 9.4*  HCT 30.9*   < > 29.3* 29.8* 28.9*  MCV 88.0  --  87.2 88.7 89.2  PLT 44*   < > 52* 70* 81*   < > = values in this interval not displayed.   Blood Culture    Component Value Date/Time   SDES BLOOD LEFT HAND 01/22/2020 0729   SPECREQUEST  01/22/2020 0729     BOTTLES DRAWN AEROBIC ONLY Blood Culture results may not be optimal due to an inadequate volume of blood received in culture bottles   CULT  01/22/2020 0729    NO GROWTH 2 DAYS Performed at [facility], [place] [address], [place], [place] [address]    REPTSTATUS PENDING 01/22/2020 5361    Cardiac Enzymes: No results for input(s): CKTOTAL, CKMB, CKMBINDEX, TROPONINI in the last 168 hours. CBG: No results for input(s): GLUCAP in the last 168 hours. Iron Studies: No results for input(s): IRON, TIBC, TRANSFERRIN, FERRITIN in the last 72 hours. @lablastinr3 @ Studies/Results: ECHO TEE  Result Date: 01/22/2020    TRANSESOPHOGEAL ECHO REPORT   Patient Name:   Adrian Frye Date of Exam: 01/22/2020 Medical Rec #:  [id number]             Height:       67.0 in Accession #:  [id number]            Weight:       286.6 lb Date of Birth:  28-Sep-1970             BSA:          2.356 m Patient Age:    49 years              BP:           162/89 mmHg Patient Gender: M                     HR:           95 bpm. Exam Location:  Inpatient Procedure: Transesophageal Echo, Color Doppler and Cardiac Doppler Indications:     Bacteremia 790.7 / R78.81  History:         Patient has prior history of Echocardiogram examinations. Risk                  Factors:Hypertension and Sleep Apnea. End stage renal diseae,                  Anemia, thrombocytopenia.  Sonographer:     Darlina Sicilian RDCS Referring Phys:  [address] [place] Diagnosing Phys: Lyman Bishop MD  Sonographer Comments: Technically difficult study due to poor echo windows. Image acquisition challenging due to patient body habitus. PROCEDURE: The transesophogeal probe was passed without difficulty through the esophogus of the patient. Sedation performed by different physician. The patient was monitored while under deep sedation. Anesthestetic sedation was provided intravenously by Anesthesiology: 217mg  of Propofol. The patient developed no complications  during the procedure. IMPRESSIONS  1. Left ventricular ejection fraction, by estimation, is 50 to 55%. The left ventricle has low normal function. There is mild left ventricular hypertrophy.  2. Right ventricular systolic function is normal. The right ventricular size is normal.  3. No left atrial/left atrial appendage thrombus was detected.  4. The mitral valve is grossly normal. Trivial mitral valve regurgitation.  5. The aortic valve is tricuspid. Aortic valve regurgitation is not visualized.  6. 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 The pulmonic valve was abnormal. Pulmonic valve regurgitation is moderate. Conclusion(s)/Recommendation(s): Findings are concerning for vegetation/infective endocarditis as detailed above. FINDINGS  Left Ventricle: Left ventricular ejection fraction, by estimation, is 50 to 55%. The left ventricle has low normal function. The left ventricular internal cavity size was normal in size. There is mild left ventricular hypertrophy. Right Ventricle: The right ventricular size is normal. No increase in right ventricular wall thickness. Right ventricular systolic function is normal. Left Atrium: Left atrial size was normal in size. No left atrial/left atrial appendage thrombus was detected. Right Atrium: Right atrial size was normal in size. Pericardium: There is no evidence of pericardial effusion. Mitral Valve: The mitral valve is grossly normal. Trivial mitral valve regurgitation. Tricuspid Valve: The tricuspid valve is grossly normal. Tricuspid valve regurgitation is trivial. Aortic Valve: The aortic valve is tricuspid. Aortic valve regurgitation is not visualized. Pulmonic Valve: Large mobile 1 x 1.5 cm mass consistent with vegetation. The pulmonic valve was abnormal. Pulmonic valve regurgitation is moderate. Aorta: The aortic root and ascending aorta are structurally normal, with no evidence of dilitation. IAS/Shunts: No atrial level shunt detected by color flow  Doppler. Lyman Bishop MD Electronically signed by Lyman Bishop MD Signature Date/Time: 01/22/2020/3:16:54 PM    Final    Medications: .  ceFAZolin (ANCEF) IV    .  dialysis solution 1.5% low-MG/low-CA     . amLODipine  2.5 mg Oral Daily  . calcitRIOL  0.5 mcg Oral Daily  . chlorhexidine      . Chlorhexidine Gluconate Cloth  6 each Topical Daily  . ferric citrate  840 mg Oral TID WC  . gentamicin cream  1 application Topical Daily  . lidocaine      . senna-docusate  1 tablet Oral BID     Dialysis Orders: CCPD 7x week 6 exchanges, all 1.5% dextrose, fill volume 3000 ml Day dwell 1.5% dextrose, 2000 mL EDW 128kg  01/17/20 labs Hgb 10.8, tsat 25%, WBC 13.07, Plt 82, Na 132, K+ 3.5, Bicarbonate 20, Alb 3.7, Ca 8.0, Phos 6.4   Assessment/Plan: 1. Bacteremia: In patient/OP cultures positive for Staph lugundensis. Patient started on IV vancomycin now changed to Ancef. Suspect TDC is likely source of infection given recent drainage from exit site(no longer in use for dialysis but patient left in place for blood draws but outpatient HD unit confirms they have an RN who can draw his labs peripherally). 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 Plan for 6 week cefazolin per ID. LIJ tunneled central line placed today.  2. ESRD:On PD, continue 6 exchanges overnight (3L) and daytime dwell (2L) with all 1.5% dextrose.Outpatient clearances not to goal but patient does not want to return to HD, hoping for eventual transplant after weight loss. 3. Hypokalemia:K+ running low, 3.2 today. Receiving KCl 49mEq PO. Continue daily BMP, replete as needed.  4. Hypertension/volume:BP controlled, does not appear volume overloaded. Continue all 1.5% exchanges. Hyponatremia noted with Na 128-improved to 130.Continue home BP meds (amlodipine). 5. Anemia:Hgb 9.4-slowly trending down. Patient declines aranesp. Resume mircera at discharge. 6. Metabolic bone  disease:Corrected calcium 9.4. Continue calcitriol 0.43mcg daily and auryxia 4 tabs TID with meals. 7. Thrombocytopenia:Plt trending up, 81today, anticoagulation on hold. Per primary.  Disposition: DC home today. DC orders noted.   Jhordan Kinter H. Amaia Lavallie NP-C 01/24/2020, 11:21 AM  [employer] [phone]

## 2020-01-24 NOTE — Care Management Important Message (Signed)
Important Message  Patient Details  Name: Adrian Frye MRN: [id number] Date of Birth: 17-Feb-1971   Medicare Important Message Given:  Yes - Important Message mailed due to current National Emergency  Verbal consent obtained due to current National Emergency  Relationship to patient: Self Contact Name: Ariyan Brisendine Call Date: 01/24/20  Time: 0914 Phone: [phone] Outcome: Spoke with contact Important Message mailed to: Patient address on file    [place] 01/24/2020, 9:15 AM

## 2020-01-25 DIAGNOSIS — N186 End stage renal disease: Secondary | ICD-10-CM | POA: Diagnosis not present

## 2020-01-25 DIAGNOSIS — Z992 Dependence on renal dialysis: Secondary | ICD-10-CM | POA: Diagnosis not present

## 2020-01-25 DIAGNOSIS — N2581 Secondary hyperparathyroidism of renal origin: Secondary | ICD-10-CM | POA: Diagnosis not present

## 2020-01-26 DIAGNOSIS — N2581 Secondary hyperparathyroidism of renal origin: Secondary | ICD-10-CM | POA: Diagnosis not present

## 2020-01-26 DIAGNOSIS — Z992 Dependence on renal dialysis: Secondary | ICD-10-CM | POA: Diagnosis not present

## 2020-01-26 DIAGNOSIS — N186 End stage renal disease: Secondary | ICD-10-CM | POA: Diagnosis not present

## 2020-01-27 ENCOUNTER — Telehealth: Payer: Self-pay

## 2020-01-27 ENCOUNTER — Telehealth: Payer: Self-pay | Admitting: Internal Medicine

## 2020-01-27 DIAGNOSIS — E785 Hyperlipidemia, unspecified: Secondary | ICD-10-CM | POA: Diagnosis not present

## 2020-01-27 DIAGNOSIS — J45909 Unspecified asthma, uncomplicated: Secondary | ICD-10-CM | POA: Diagnosis not present

## 2020-01-27 DIAGNOSIS — N2581 Secondary hyperparathyroidism of renal origin: Secondary | ICD-10-CM | POA: Diagnosis not present

## 2020-01-27 DIAGNOSIS — G4733 Obstructive sleep apnea (adult) (pediatric): Secondary | ICD-10-CM | POA: Diagnosis not present

## 2020-01-27 DIAGNOSIS — N250 Renal osteodystrophy: Secondary | ICD-10-CM | POA: Diagnosis not present

## 2020-01-27 DIAGNOSIS — A4101 Sepsis due to Methicillin susceptible Staphylococcus aureus: Secondary | ICD-10-CM | POA: Diagnosis not present

## 2020-01-27 DIAGNOSIS — R7881 Bacteremia: Secondary | ICD-10-CM | POA: Diagnosis not present

## 2020-01-27 DIAGNOSIS — I132 Hypertensive heart and chronic kidney disease with heart failure and with stage 5 chronic kidney disease, or end stage renal disease: Secondary | ICD-10-CM | POA: Diagnosis not present

## 2020-01-27 DIAGNOSIS — D696 Thrombocytopenia, unspecified: Secondary | ICD-10-CM | POA: Diagnosis not present

## 2020-01-27 DIAGNOSIS — Z992 Dependence on renal dialysis: Secondary | ICD-10-CM | POA: Diagnosis not present

## 2020-01-27 DIAGNOSIS — N186 End stage renal disease: Secondary | ICD-10-CM | POA: Diagnosis not present

## 2020-01-27 DIAGNOSIS — G9341 Metabolic encephalopathy: Secondary | ICD-10-CM | POA: Diagnosis not present

## 2020-01-27 DIAGNOSIS — E871 Hypo-osmolality and hyponatremia: Secondary | ICD-10-CM | POA: Diagnosis not present

## 2020-01-27 DIAGNOSIS — D631 Anemia in chronic kidney disease: Secondary | ICD-10-CM | POA: Diagnosis not present

## 2020-01-27 DIAGNOSIS — I509 Heart failure, unspecified: Secondary | ICD-10-CM | POA: Diagnosis not present

## 2020-01-27 DIAGNOSIS — E876 Hypokalemia: Secondary | ICD-10-CM | POA: Diagnosis not present

## 2020-01-27 DIAGNOSIS — T827XXA Infection and inflammatory reaction due to other cardiac and vascular devices, implants and grafts, initial encounter: Secondary | ICD-10-CM | POA: Diagnosis not present

## 2020-01-27 NOTE — Telephone Encounter (Signed)
I called pt. LM to get him scheduled for a hospital f/u this week.

## 2020-01-27 NOTE — Telephone Encounter (Signed)
Adrian Frye with [facility] called for verbal orders for 1 time a week for 9 weeks for treatment of pic line.  Vickie oked verbal orders. Orders given to [facility]

## 2020-01-28 ENCOUNTER — Encounter (HOSPITAL_COMMUNITY): Payer: Self-pay

## 2020-01-28 ENCOUNTER — Other Ambulatory Visit (HOSPITAL_COMMUNITY): Payer: Self-pay | Admitting: Internal Medicine

## 2020-01-28 DIAGNOSIS — N186 End stage renal disease: Secondary | ICD-10-CM | POA: Diagnosis not present

## 2020-01-28 DIAGNOSIS — N2581 Secondary hyperparathyroidism of renal origin: Secondary | ICD-10-CM | POA: Diagnosis not present

## 2020-01-28 DIAGNOSIS — Z992 Dependence on renal dialysis: Secondary | ICD-10-CM | POA: Diagnosis not present

## 2020-01-28 DIAGNOSIS — I330 Acute and subacute infective endocarditis: Secondary | ICD-10-CM

## 2020-01-28 DIAGNOSIS — I38 Endocarditis, valve unspecified: Secondary | ICD-10-CM

## 2020-01-29 DIAGNOSIS — Z992 Dependence on renal dialysis: Secondary | ICD-10-CM | POA: Diagnosis not present

## 2020-01-29 DIAGNOSIS — N2581 Secondary hyperparathyroidism of renal origin: Secondary | ICD-10-CM | POA: Diagnosis not present

## 2020-01-29 DIAGNOSIS — N186 End stage renal disease: Secondary | ICD-10-CM | POA: Diagnosis not present

## 2020-01-29 LAB — CULTURE, BLOOD (ROUTINE X 2)
Culture: NO GROWTH
Culture: NO GROWTH

## 2020-01-30 DIAGNOSIS — Z992 Dependence on renal dialysis: Secondary | ICD-10-CM | POA: Diagnosis not present

## 2020-01-30 DIAGNOSIS — N2581 Secondary hyperparathyroidism of renal origin: Secondary | ICD-10-CM | POA: Diagnosis not present

## 2020-01-30 DIAGNOSIS — N186 End stage renal disease: Secondary | ICD-10-CM | POA: Diagnosis not present

## 2020-01-31 DIAGNOSIS — N186 End stage renal disease: Secondary | ICD-10-CM | POA: Diagnosis not present

## 2020-01-31 DIAGNOSIS — N2581 Secondary hyperparathyroidism of renal origin: Secondary | ICD-10-CM | POA: Diagnosis not present

## 2020-01-31 DIAGNOSIS — Z992 Dependence on renal dialysis: Secondary | ICD-10-CM | POA: Diagnosis not present

## 2020-02-01 DIAGNOSIS — N2581 Secondary hyperparathyroidism of renal origin: Secondary | ICD-10-CM | POA: Diagnosis not present

## 2020-02-01 DIAGNOSIS — Z992 Dependence on renal dialysis: Secondary | ICD-10-CM | POA: Diagnosis not present

## 2020-02-01 DIAGNOSIS — N186 End stage renal disease: Secondary | ICD-10-CM | POA: Diagnosis not present

## 2020-02-02 DIAGNOSIS — N186 End stage renal disease: Secondary | ICD-10-CM | POA: Diagnosis not present

## 2020-02-02 DIAGNOSIS — N2581 Secondary hyperparathyroidism of renal origin: Secondary | ICD-10-CM | POA: Diagnosis not present

## 2020-02-02 DIAGNOSIS — Z992 Dependence on renal dialysis: Secondary | ICD-10-CM | POA: Diagnosis not present

## 2020-02-03 ENCOUNTER — Encounter: Payer: Self-pay | Admitting: Internal Medicine

## 2020-02-03 DIAGNOSIS — Z992 Dependence on renal dialysis: Secondary | ICD-10-CM | POA: Diagnosis not present

## 2020-02-03 DIAGNOSIS — Z792 Long term (current) use of antibiotics: Secondary | ICD-10-CM | POA: Diagnosis not present

## 2020-02-03 DIAGNOSIS — N186 End stage renal disease: Secondary | ICD-10-CM | POA: Diagnosis not present

## 2020-02-03 DIAGNOSIS — R7881 Bacteremia: Secondary | ICD-10-CM | POA: Diagnosis not present

## 2020-02-03 DIAGNOSIS — Z452 Encounter for adjustment and management of vascular access device: Secondary | ICD-10-CM | POA: Diagnosis not present

## 2020-02-03 DIAGNOSIS — N2581 Secondary hyperparathyroidism of renal origin: Secondary | ICD-10-CM | POA: Diagnosis not present

## 2020-02-04 DIAGNOSIS — Z992 Dependence on renal dialysis: Secondary | ICD-10-CM | POA: Diagnosis not present

## 2020-02-04 DIAGNOSIS — N186 End stage renal disease: Secondary | ICD-10-CM | POA: Diagnosis not present

## 2020-02-04 DIAGNOSIS — N2581 Secondary hyperparathyroidism of renal origin: Secondary | ICD-10-CM | POA: Diagnosis not present

## 2020-02-05 DIAGNOSIS — N2581 Secondary hyperparathyroidism of renal origin: Secondary | ICD-10-CM | POA: Diagnosis not present

## 2020-02-05 DIAGNOSIS — Z992 Dependence on renal dialysis: Secondary | ICD-10-CM | POA: Diagnosis not present

## 2020-02-05 DIAGNOSIS — N186 End stage renal disease: Secondary | ICD-10-CM | POA: Diagnosis not present

## 2020-02-06 DIAGNOSIS — Z992 Dependence on renal dialysis: Secondary | ICD-10-CM | POA: Diagnosis not present

## 2020-02-06 DIAGNOSIS — N186 End stage renal disease: Secondary | ICD-10-CM | POA: Diagnosis not present

## 2020-02-06 DIAGNOSIS — N2581 Secondary hyperparathyroidism of renal origin: Secondary | ICD-10-CM | POA: Diagnosis not present

## 2020-02-06 NOTE — Progress Notes (Signed)
° °  Subjective:    Patient ID: Adrian Frye, male    DOB: 1970/09/06, 49 y.o.   MRN: [id number]  HPI Chief Complaint  Patient presents with   hospital follow-up    hospital follow-up, has some questions about personal stuff   He is here for a hospital discharge follow up. He was admitted on 01/20/2020 and discharged on 01/24/2020.  Diagnosed with bacteremia.  He has been giving himself IV antibiotics daily. States he feels back to baseline.    He saw his nephrologist 3 days ago.  States he has labs drawn every Monday.  Since hospital discharge he has had labs drawn on 2 separate occasions including this past Monday.  States his nephrologist has been getting the results.  He keeps a close eye on his vital signs at home -BP at home this was 141/86 and weight was 279.5  Pulse 98 States his blood pressure is elevated here today because he gets nervous when he comes here.  He is being followed by Dr. Linus Salmons, ID and Dr. Debara Pickett, cardiology.     States he has been thinking about bariatric surgery and he is somewhat hesitant.  He would like to try in lose weight by improving diet and increasing exercise once the Port-A-Cath is out.  States he has been having issues with ED.  Difficulty maintaining an erection.  He is interested in medication.  Denies fever, chills, dizziness, chest pain, palpitations, shortness of breath, abdominal pain, N/V/D, urinary symptoms, LE edema.   Reviewed allergies, medications, past medical, surgical, family, and social history.    Review of Systems Pertinent positives and negatives in the history of present illness.     Objective:   Physical Exam BP (!) 150/100    Pulse 97    Temp 98.1 F (36.7 C)    Wt 292 lb 12.8 oz (132.8 kg)    SpO2 98%    BMI 45.86 kg/m   Alert and oriented and in no acute distress.  Respirations unlabored.  Normal speech, mood and thought process.      Assessment & Plan:  Hospital discharge follow-up -He followed up  with his nephrologist since being discharged from the hospital.  Reviewed hospital discharge summary as well as medications, labs.  [facility] is doing blood work every Monday and his nephrologist is reviewing these.  Bacteremia -He is currently administering IV antibiotics daily and doing well.  Followed by ID  ESRD on peritoneal dialysis ([place]) -Followed closely by nephrology  Chronic diastolic heart failure ([place]) -He is actually had some weight loss.  No sign of fluid overload.  Hypertension, unspecified type -He is aware that his blood pressure is elevated here today.  His blood pressure readings at home have been in goal range.  He will continue keeping a close eye on his blood pressure.  Morbid obesity ([place]) -He is quite motivated to lose weight.  We discussed the possibility of referral to [facility] health weight management and he will let me know if he would like to do this.  We also discussed the psychological component to eating and he could try Noom   Erectile dysfunction, unspecified erectile dysfunction type -Discussed that this is a medical condition, a blood flow issue and that there is medication to help him.  He will check with his cardiologist due to heart disease and I am happy to prescribe medication for him if his cardiologist is on board with this.

## 2020-02-07 ENCOUNTER — Other Ambulatory Visit: Payer: Self-pay

## 2020-02-07 ENCOUNTER — Encounter: Payer: Self-pay | Admitting: Family Medicine

## 2020-02-07 ENCOUNTER — Ambulatory Visit (INDEPENDENT_AMBULATORY_CARE_PROVIDER_SITE_OTHER): Payer: Federal, State, Local not specified - PPO | Admitting: Family Medicine

## 2020-02-07 VITALS — BP 150/100 | HR 97 | Temp 98.1°F | Wt 292.8 lb

## 2020-02-07 DIAGNOSIS — I5032 Chronic diastolic (congestive) heart failure: Secondary | ICD-10-CM

## 2020-02-07 DIAGNOSIS — I10 Essential (primary) hypertension: Secondary | ICD-10-CM | POA: Diagnosis not present

## 2020-02-07 DIAGNOSIS — N529 Male erectile dysfunction, unspecified: Secondary | ICD-10-CM

## 2020-02-07 DIAGNOSIS — Z992 Dependence on renal dialysis: Secondary | ICD-10-CM | POA: Diagnosis not present

## 2020-02-07 DIAGNOSIS — R7881 Bacteremia: Secondary | ICD-10-CM | POA: Diagnosis not present

## 2020-02-07 DIAGNOSIS — N186 End stage renal disease: Secondary | ICD-10-CM

## 2020-02-07 DIAGNOSIS — N2581 Secondary hyperparathyroidism of renal origin: Secondary | ICD-10-CM | POA: Diagnosis not present

## 2020-02-07 DIAGNOSIS — Z09 Encounter for follow-up examination after completed treatment for conditions other than malignant neoplasm: Secondary | ICD-10-CM

## 2020-02-08 DIAGNOSIS — Z992 Dependence on renal dialysis: Secondary | ICD-10-CM | POA: Diagnosis not present

## 2020-02-08 DIAGNOSIS — N2581 Secondary hyperparathyroidism of renal origin: Secondary | ICD-10-CM | POA: Diagnosis not present

## 2020-02-08 DIAGNOSIS — N186 End stage renal disease: Secondary | ICD-10-CM | POA: Diagnosis not present

## 2020-02-09 DIAGNOSIS — Z992 Dependence on renal dialysis: Secondary | ICD-10-CM | POA: Diagnosis not present

## 2020-02-09 DIAGNOSIS — N2581 Secondary hyperparathyroidism of renal origin: Secondary | ICD-10-CM | POA: Diagnosis not present

## 2020-02-09 DIAGNOSIS — N186 End stage renal disease: Secondary | ICD-10-CM | POA: Diagnosis not present

## 2020-02-10 DIAGNOSIS — Z452 Encounter for adjustment and management of vascular access device: Secondary | ICD-10-CM | POA: Diagnosis not present

## 2020-02-10 DIAGNOSIS — N186 End stage renal disease: Secondary | ICD-10-CM | POA: Diagnosis not present

## 2020-02-10 DIAGNOSIS — N2581 Secondary hyperparathyroidism of renal origin: Secondary | ICD-10-CM | POA: Diagnosis not present

## 2020-02-10 DIAGNOSIS — Z992 Dependence on renal dialysis: Secondary | ICD-10-CM | POA: Diagnosis not present

## 2020-02-10 DIAGNOSIS — Z792 Long term (current) use of antibiotics: Secondary | ICD-10-CM | POA: Diagnosis not present

## 2020-02-11 DIAGNOSIS — N186 End stage renal disease: Secondary | ICD-10-CM | POA: Diagnosis not present

## 2020-02-11 DIAGNOSIS — N2581 Secondary hyperparathyroidism of renal origin: Secondary | ICD-10-CM | POA: Diagnosis not present

## 2020-02-11 DIAGNOSIS — Z992 Dependence on renal dialysis: Secondary | ICD-10-CM | POA: Diagnosis not present

## 2020-02-12 DIAGNOSIS — N186 End stage renal disease: Secondary | ICD-10-CM | POA: Diagnosis not present

## 2020-02-12 DIAGNOSIS — Z992 Dependence on renal dialysis: Secondary | ICD-10-CM | POA: Diagnosis not present

## 2020-02-12 DIAGNOSIS — N2581 Secondary hyperparathyroidism of renal origin: Secondary | ICD-10-CM | POA: Diagnosis not present

## 2020-02-13 ENCOUNTER — Encounter: Payer: Self-pay | Admitting: Family Medicine

## 2020-02-13 DIAGNOSIS — N186 End stage renal disease: Secondary | ICD-10-CM | POA: Diagnosis not present

## 2020-02-13 DIAGNOSIS — Z992 Dependence on renal dialysis: Secondary | ICD-10-CM | POA: Diagnosis not present

## 2020-02-13 DIAGNOSIS — E1122 Type 2 diabetes mellitus with diabetic chronic kidney disease: Secondary | ICD-10-CM | POA: Diagnosis not present

## 2020-02-13 DIAGNOSIS — N2581 Secondary hyperparathyroidism of renal origin: Secondary | ICD-10-CM | POA: Diagnosis not present

## 2020-02-14 DIAGNOSIS — Z992 Dependence on renal dialysis: Secondary | ICD-10-CM | POA: Diagnosis not present

## 2020-02-14 DIAGNOSIS — N186 End stage renal disease: Secondary | ICD-10-CM | POA: Diagnosis not present

## 2020-02-14 DIAGNOSIS — N2581 Secondary hyperparathyroidism of renal origin: Secondary | ICD-10-CM | POA: Diagnosis not present

## 2020-02-15 DIAGNOSIS — Z992 Dependence on renal dialysis: Secondary | ICD-10-CM | POA: Diagnosis not present

## 2020-02-15 DIAGNOSIS — N186 End stage renal disease: Secondary | ICD-10-CM | POA: Diagnosis not present

## 2020-02-15 DIAGNOSIS — N2581 Secondary hyperparathyroidism of renal origin: Secondary | ICD-10-CM | POA: Diagnosis not present

## 2020-02-16 DIAGNOSIS — N186 End stage renal disease: Secondary | ICD-10-CM | POA: Diagnosis not present

## 2020-02-16 DIAGNOSIS — Z23 Encounter for immunization: Secondary | ICD-10-CM | POA: Diagnosis not present

## 2020-02-16 DIAGNOSIS — Z992 Dependence on renal dialysis: Secondary | ICD-10-CM | POA: Diagnosis not present

## 2020-02-16 DIAGNOSIS — N2581 Secondary hyperparathyroidism of renal origin: Secondary | ICD-10-CM | POA: Diagnosis not present

## 2020-02-17 DIAGNOSIS — N186 End stage renal disease: Secondary | ICD-10-CM | POA: Diagnosis not present

## 2020-02-17 DIAGNOSIS — A419 Sepsis, unspecified organism: Secondary | ICD-10-CM | POA: Diagnosis not present

## 2020-02-17 DIAGNOSIS — Z452 Encounter for adjustment and management of vascular access device: Secondary | ICD-10-CM | POA: Diagnosis not present

## 2020-02-17 DIAGNOSIS — Z792 Long term (current) use of antibiotics: Secondary | ICD-10-CM | POA: Diagnosis not present

## 2020-02-17 DIAGNOSIS — N2581 Secondary hyperparathyroidism of renal origin: Secondary | ICD-10-CM | POA: Diagnosis not present

## 2020-02-17 DIAGNOSIS — Z23 Encounter for immunization: Secondary | ICD-10-CM | POA: Diagnosis not present

## 2020-02-17 DIAGNOSIS — Z992 Dependence on renal dialysis: Secondary | ICD-10-CM | POA: Diagnosis not present

## 2020-02-18 DIAGNOSIS — Z23 Encounter for immunization: Secondary | ICD-10-CM | POA: Diagnosis not present

## 2020-02-18 DIAGNOSIS — N186 End stage renal disease: Secondary | ICD-10-CM | POA: Diagnosis not present

## 2020-02-18 DIAGNOSIS — N2581 Secondary hyperparathyroidism of renal origin: Secondary | ICD-10-CM | POA: Diagnosis not present

## 2020-02-18 DIAGNOSIS — Z992 Dependence on renal dialysis: Secondary | ICD-10-CM | POA: Diagnosis not present

## 2020-02-19 ENCOUNTER — Encounter: Payer: Self-pay | Admitting: Family Medicine

## 2020-02-19 DIAGNOSIS — N186 End stage renal disease: Secondary | ICD-10-CM | POA: Diagnosis not present

## 2020-02-19 DIAGNOSIS — N2581 Secondary hyperparathyroidism of renal origin: Secondary | ICD-10-CM | POA: Diagnosis not present

## 2020-02-19 DIAGNOSIS — Z23 Encounter for immunization: Secondary | ICD-10-CM | POA: Diagnosis not present

## 2020-02-19 DIAGNOSIS — Z992 Dependence on renal dialysis: Secondary | ICD-10-CM | POA: Diagnosis not present

## 2020-02-20 ENCOUNTER — Encounter: Payer: Self-pay | Admitting: Family Medicine

## 2020-02-20 DIAGNOSIS — N186 End stage renal disease: Secondary | ICD-10-CM | POA: Diagnosis not present

## 2020-02-20 DIAGNOSIS — Z23 Encounter for immunization: Secondary | ICD-10-CM | POA: Diagnosis not present

## 2020-02-20 DIAGNOSIS — N2581 Secondary hyperparathyroidism of renal origin: Secondary | ICD-10-CM | POA: Diagnosis not present

## 2020-02-20 DIAGNOSIS — Z992 Dependence on renal dialysis: Secondary | ICD-10-CM | POA: Diagnosis not present

## 2020-02-21 DIAGNOSIS — N2581 Secondary hyperparathyroidism of renal origin: Secondary | ICD-10-CM | POA: Diagnosis not present

## 2020-02-21 DIAGNOSIS — N186 End stage renal disease: Secondary | ICD-10-CM | POA: Diagnosis not present

## 2020-02-21 DIAGNOSIS — Z992 Dependence on renal dialysis: Secondary | ICD-10-CM | POA: Diagnosis not present

## 2020-02-21 DIAGNOSIS — Z23 Encounter for immunization: Secondary | ICD-10-CM | POA: Diagnosis not present

## 2020-02-22 DIAGNOSIS — N186 End stage renal disease: Secondary | ICD-10-CM | POA: Diagnosis not present

## 2020-02-22 DIAGNOSIS — Z23 Encounter for immunization: Secondary | ICD-10-CM | POA: Diagnosis not present

## 2020-02-22 DIAGNOSIS — Z992 Dependence on renal dialysis: Secondary | ICD-10-CM | POA: Diagnosis not present

## 2020-02-22 DIAGNOSIS — N2581 Secondary hyperparathyroidism of renal origin: Secondary | ICD-10-CM | POA: Diagnosis not present

## 2020-02-23 DIAGNOSIS — N186 End stage renal disease: Secondary | ICD-10-CM | POA: Diagnosis not present

## 2020-02-23 DIAGNOSIS — Z992 Dependence on renal dialysis: Secondary | ICD-10-CM | POA: Diagnosis not present

## 2020-02-23 DIAGNOSIS — N2581 Secondary hyperparathyroidism of renal origin: Secondary | ICD-10-CM | POA: Diagnosis not present

## 2020-02-24 ENCOUNTER — Encounter: Payer: Self-pay | Admitting: Internal Medicine

## 2020-02-24 ENCOUNTER — Other Ambulatory Visit: Payer: Self-pay

## 2020-02-24 ENCOUNTER — Ambulatory Visit: Payer: Medicare Other | Admitting: Internal Medicine

## 2020-02-24 DIAGNOSIS — D696 Thrombocytopenia, unspecified: Secondary | ICD-10-CM

## 2020-02-24 DIAGNOSIS — B958 Unspecified staphylococcus as the cause of diseases classified elsewhere: Secondary | ICD-10-CM

## 2020-02-24 DIAGNOSIS — Z992 Dependence on renal dialysis: Secondary | ICD-10-CM | POA: Diagnosis not present

## 2020-02-24 DIAGNOSIS — I330 Acute and subacute infective endocarditis: Secondary | ICD-10-CM

## 2020-02-24 DIAGNOSIS — Z452 Encounter for adjustment and management of vascular access device: Secondary | ICD-10-CM | POA: Diagnosis not present

## 2020-02-24 DIAGNOSIS — T827XXA Infection and inflammatory reaction due to other cardiac and vascular devices, implants and grafts, initial encounter: Secondary | ICD-10-CM | POA: Diagnosis not present

## 2020-02-24 DIAGNOSIS — N2581 Secondary hyperparathyroidism of renal origin: Secondary | ICD-10-CM | POA: Diagnosis not present

## 2020-02-24 DIAGNOSIS — E440 Moderate protein-calorie malnutrition: Secondary | ICD-10-CM | POA: Insufficient documentation

## 2020-02-24 DIAGNOSIS — N186 End stage renal disease: Secondary | ICD-10-CM | POA: Diagnosis not present

## 2020-02-24 NOTE — Assessment & Plan Note (Signed)
This has improved with near normal platelet levels most recently

## 2020-02-24 NOTE — Progress Notes (Signed)
   Subjective:    Patient ID: [name], male    DOB: 1971/03/01, 49 y.o.   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  He had a chronic line in that was previously used for HD and was reluctant to have it removed, despite nephrologies recommendation.  He has been on peritoneal dialysis so no line needed.  He had that line removed and now with a tunneled catheter for the IV antibiotics.  He has continued with cefazolin and plan to complete 6 weeks 10/20.  He is followed by nephrology and has established a PCP.  Feels well, no complaints.  Going on a cruise this Thursday and asking about getting the line removed a week early.   Review of Systems  Constitutional: Negative for chills, fatigue and fever.  Gastrointestinal: Negative for diarrhea and nausea.       Objective:   Physical Exam Constitutional:      Appearance: Normal appearance.  Eyes:     General: No scleral icterus. Pulmonary:     Effort: Pulmonary effort is normal.  Neurological:     General: No focal deficit present.     Mental Status: He is alert.  Psychiatric:        Mood and Affect: Mood normal.   SH: No tobacco       Assessment & Plan:

## 2020-02-24 NOTE — Assessment & Plan Note (Signed)
He is doing well with treatment and due to complete antibiotics 10/20.  I have emphasized that it is ideal to complete the 6 weeks and I do not recommend stopping early.  However, if he prefers to stop early, he will call to get the IR Tunneled cath removal for this week instead of next week.  If he does this, I discussed the risk of relapse with early completion of treatment and he voiced his understanding.

## 2020-02-24 NOTE — Assessment & Plan Note (Signed)
He is working on his weight loss to better position him for a transplant.

## 2020-02-25 DIAGNOSIS — N186 End stage renal disease: Secondary | ICD-10-CM | POA: Diagnosis not present

## 2020-02-25 DIAGNOSIS — Z992 Dependence on renal dialysis: Secondary | ICD-10-CM | POA: Diagnosis not present

## 2020-02-25 DIAGNOSIS — N2581 Secondary hyperparathyroidism of renal origin: Secondary | ICD-10-CM | POA: Diagnosis not present

## 2020-02-26 ENCOUNTER — Encounter: Payer: Self-pay | Admitting: Internal Medicine

## 2020-02-26 DIAGNOSIS — N2581 Secondary hyperparathyroidism of renal origin: Secondary | ICD-10-CM | POA: Diagnosis not present

## 2020-02-26 DIAGNOSIS — Z992 Dependence on renal dialysis: Secondary | ICD-10-CM | POA: Diagnosis not present

## 2020-02-26 DIAGNOSIS — N186 End stage renal disease: Secondary | ICD-10-CM | POA: Diagnosis not present

## 2020-02-27 DIAGNOSIS — N2581 Secondary hyperparathyroidism of renal origin: Secondary | ICD-10-CM | POA: Diagnosis not present

## 2020-02-27 DIAGNOSIS — N186 End stage renal disease: Secondary | ICD-10-CM | POA: Diagnosis not present

## 2020-02-27 DIAGNOSIS — Z992 Dependence on renal dialysis: Secondary | ICD-10-CM | POA: Diagnosis not present

## 2020-02-28 DIAGNOSIS — N2581 Secondary hyperparathyroidism of renal origin: Secondary | ICD-10-CM | POA: Diagnosis not present

## 2020-02-28 DIAGNOSIS — Z992 Dependence on renal dialysis: Secondary | ICD-10-CM | POA: Diagnosis not present

## 2020-02-28 DIAGNOSIS — N186 End stage renal disease: Secondary | ICD-10-CM | POA: Diagnosis not present

## 2020-02-29 DIAGNOSIS — N2581 Secondary hyperparathyroidism of renal origin: Secondary | ICD-10-CM | POA: Diagnosis not present

## 2020-02-29 DIAGNOSIS — N186 End stage renal disease: Secondary | ICD-10-CM | POA: Diagnosis not present

## 2020-02-29 DIAGNOSIS — Z992 Dependence on renal dialysis: Secondary | ICD-10-CM | POA: Diagnosis not present

## 2020-03-01 DIAGNOSIS — N186 End stage renal disease: Secondary | ICD-10-CM | POA: Diagnosis not present

## 2020-03-01 DIAGNOSIS — N2581 Secondary hyperparathyroidism of renal origin: Secondary | ICD-10-CM | POA: Diagnosis not present

## 2020-03-01 DIAGNOSIS — Z992 Dependence on renal dialysis: Secondary | ICD-10-CM | POA: Diagnosis not present

## 2020-03-02 DIAGNOSIS — N186 End stage renal disease: Secondary | ICD-10-CM | POA: Diagnosis not present

## 2020-03-02 DIAGNOSIS — N2581 Secondary hyperparathyroidism of renal origin: Secondary | ICD-10-CM | POA: Diagnosis not present

## 2020-03-02 DIAGNOSIS — Z992 Dependence on renal dialysis: Secondary | ICD-10-CM | POA: Diagnosis not present

## 2020-03-03 ENCOUNTER — Telehealth: Payer: Self-pay

## 2020-03-03 DIAGNOSIS — N2581 Secondary hyperparathyroidism of renal origin: Secondary | ICD-10-CM | POA: Diagnosis not present

## 2020-03-03 DIAGNOSIS — Z992 Dependence on renal dialysis: Secondary | ICD-10-CM | POA: Diagnosis not present

## 2020-03-03 DIAGNOSIS — N186 End stage renal disease: Secondary | ICD-10-CM | POA: Diagnosis not present

## 2020-03-03 NOTE — Telephone Encounter (Signed)
Patient called to follow up on picc removal. Patient completes IV therapy on 10/20. Noted that Dr. Linus Salmons has placed order in epic for tunneled  picc removal. [facility] will follow up with IR to coordinate Picc removal and make patient aware of date/time once scheduled.  Adrian Frye

## 2020-03-03 NOTE — Telephone Encounter (Signed)
Spoke with patient and gave him appointment day and time of 03/06/20 at 1:30 pm. Patient verbalizes understanding and has no further questions.  Beryle Flock, RN

## 2020-03-03 NOTE — Telephone Encounter (Signed)
Spoke with [name] in IR to schedule tunneled CVC removal for 03/06/20 at 1:30 pm. Called the patient to relay appointment details.Patient did not answer, RN left appointment date and time on patient's identified voicemail and let him know to call us if he has any questions or concerns.  Beryle Flock, RN

## 2020-03-04 ENCOUNTER — Other Ambulatory Visit: Payer: Self-pay | Admitting: Radiology

## 2020-03-04 DIAGNOSIS — Z992 Dependence on renal dialysis: Secondary | ICD-10-CM | POA: Diagnosis not present

## 2020-03-04 DIAGNOSIS — N2581 Secondary hyperparathyroidism of renal origin: Secondary | ICD-10-CM | POA: Diagnosis not present

## 2020-03-04 DIAGNOSIS — N186 End stage renal disease: Secondary | ICD-10-CM | POA: Diagnosis not present

## 2020-03-05 DIAGNOSIS — N2581 Secondary hyperparathyroidism of renal origin: Secondary | ICD-10-CM | POA: Diagnosis not present

## 2020-03-05 DIAGNOSIS — Z992 Dependence on renal dialysis: Secondary | ICD-10-CM | POA: Diagnosis not present

## 2020-03-05 DIAGNOSIS — N186 End stage renal disease: Secondary | ICD-10-CM | POA: Diagnosis not present

## 2020-03-06 ENCOUNTER — Ambulatory Visit (HOSPITAL_COMMUNITY)
Admission: RE | Admit: 2020-03-06 | Discharge: 2020-03-06 | Disposition: A | Discharge status: Home / Self Care | Payer: Federal, State, Local not specified - PPO | Source: Ambulatory Visit | Attending: Internal Medicine | Admitting: Internal Medicine

## 2020-03-06 ENCOUNTER — Other Ambulatory Visit: Payer: Self-pay

## 2020-03-06 DIAGNOSIS — R7881 Bacteremia: Secondary | ICD-10-CM | POA: Insufficient documentation

## 2020-03-06 DIAGNOSIS — Z452 Encounter for adjustment and management of vascular access device: Secondary | ICD-10-CM | POA: Insufficient documentation

## 2020-03-06 DIAGNOSIS — B958 Unspecified staphylococcus as the cause of diseases classified elsewhere: Secondary | ICD-10-CM

## 2020-03-06 DIAGNOSIS — I330 Acute and subacute infective endocarditis: Secondary | ICD-10-CM

## 2020-03-06 DIAGNOSIS — Z992 Dependence on renal dialysis: Secondary | ICD-10-CM | POA: Diagnosis not present

## 2020-03-06 DIAGNOSIS — N2581 Secondary hyperparathyroidism of renal origin: Secondary | ICD-10-CM | POA: Diagnosis not present

## 2020-03-06 DIAGNOSIS — N186 End stage renal disease: Secondary | ICD-10-CM | POA: Diagnosis not present

## 2020-03-06 HISTORY — PX: IR REMOVAL TUN CV CATH W/O FL: IMG2289

## 2020-03-06 MED ORDER — CHLORHEXIDINE GLUCONATE 4 % EX LIQD
CUTANEOUS | Status: AC
  Filled 2020-03-06: qty 15

## 2020-03-06 NOTE — Procedures (Signed)
Pre procedural Dx: Adrian Frye procedural Dx: Same  Successful removal of tunneled left IJ single lumen central venous catheter  EBL: None No immediate complications.  Please see imaging section of Epic for full dictation.  Joaquim Nam PA-C 03/06/2020 2:56 PM

## 2020-03-07 DIAGNOSIS — Z992 Dependence on renal dialysis: Secondary | ICD-10-CM | POA: Diagnosis not present

## 2020-03-07 DIAGNOSIS — N2581 Secondary hyperparathyroidism of renal origin: Secondary | ICD-10-CM | POA: Diagnosis not present

## 2020-03-07 DIAGNOSIS — N186 End stage renal disease: Secondary | ICD-10-CM | POA: Diagnosis not present

## 2020-03-08 DIAGNOSIS — N186 End stage renal disease: Secondary | ICD-10-CM | POA: Diagnosis not present

## 2020-03-08 DIAGNOSIS — Z992 Dependence on renal dialysis: Secondary | ICD-10-CM | POA: Diagnosis not present

## 2020-03-08 DIAGNOSIS — N2581 Secondary hyperparathyroidism of renal origin: Secondary | ICD-10-CM | POA: Diagnosis not present

## 2020-03-09 DIAGNOSIS — N2581 Secondary hyperparathyroidism of renal origin: Secondary | ICD-10-CM | POA: Diagnosis not present

## 2020-03-09 DIAGNOSIS — N186 End stage renal disease: Secondary | ICD-10-CM | POA: Diagnosis not present

## 2020-03-09 DIAGNOSIS — Z992 Dependence on renal dialysis: Secondary | ICD-10-CM | POA: Diagnosis not present

## 2020-03-10 ENCOUNTER — Ambulatory Visit: Payer: Federal, State, Local not specified - PPO | Admitting: Cardiology

## 2020-03-10 ENCOUNTER — Other Ambulatory Visit: Payer: Self-pay

## 2020-03-10 ENCOUNTER — Encounter: Payer: Self-pay | Admitting: Cardiology

## 2020-03-10 VITALS — BP 170/86 | HR 92 | Resp 15 | Ht 67.0 in | Wt 302.0 lb

## 2020-03-10 DIAGNOSIS — I10 Essential (primary) hypertension: Secondary | ICD-10-CM | POA: Diagnosis not present

## 2020-03-10 DIAGNOSIS — I5032 Chronic diastolic (congestive) heart failure: Secondary | ICD-10-CM | POA: Diagnosis not present

## 2020-03-10 DIAGNOSIS — Z9989 Dependence on other enabling machines and devices: Secondary | ICD-10-CM

## 2020-03-10 DIAGNOSIS — G4733 Obstructive sleep apnea (adult) (pediatric): Secondary | ICD-10-CM

## 2020-03-10 DIAGNOSIS — I420 Dilated cardiomyopathy: Secondary | ICD-10-CM | POA: Diagnosis not present

## 2020-03-10 DIAGNOSIS — I330 Acute and subacute infective endocarditis: Secondary | ICD-10-CM | POA: Diagnosis not present

## 2020-03-10 DIAGNOSIS — N186 End stage renal disease: Secondary | ICD-10-CM

## 2020-03-10 DIAGNOSIS — B958 Unspecified staphylococcus as the cause of diseases classified elsewhere: Secondary | ICD-10-CM

## 2020-03-10 DIAGNOSIS — Z992 Dependence on renal dialysis: Secondary | ICD-10-CM | POA: Diagnosis not present

## 2020-03-10 DIAGNOSIS — N2581 Secondary hyperparathyroidism of renal origin: Secondary | ICD-10-CM | POA: Diagnosis not present

## 2020-03-10 NOTE — Progress Notes (Signed)
Date:  03/10/2020   ID:  [name], DOB 1970-08-06, MRN [id number]  PCP:  Girtha Rm, NP-C  Cardiologist:  Adrian Kras, DO, [employer]  Former Cardiology Providers: Dr. Donzetta Matters (11/03/2019 consult), Dr. Lyman Bishop (TEE 01/2020)  REASON FOR CONSULT: Hypertension, congestive heart failure, dilated idiopathic cardiomyopathy  REQUESTING PHYSICIAN:  Girtha Rm, NP-C [place],  [place] [id number]  Chief Complaint  Patient presents with  . New Patient (Initial Visit)    Need ED medication and sleep medicine    HPI  Adrian Frye is a 49 y.o. male who presents to the office with a chief complaint of " establish care and medical management of his erectile dysfunction and sleep medications." Patient's past medical history and cardiovascular risk factors include: History of dilated cardiomyopathy, hypertension, end-stage renal disease on peritoneal dialysis, history of diastolic heart failure, OSA on CPAP, history of bacteremia status post pulmonic valve endocarditis, obesity due to excess calories, former smoker.   He is referred to the office at the request of Girtha Rm, NP-C for evaluation and management of hypertension, congestive heart failure, and history of dilated cardiomyopathy.  Patient was being followed by Dr. Golden Hurter at [facility] heart care and he is also seen multiple other cardiovascular providers was in their group.  He is here to reestablish care at the request of his PCP.   According to the referral received from his provider he is referred for the management of hypertension, history of congestive heart failure, and dilated cardiomyopathy.  However, patient states he would like to be evaluated for erectile dysfunction medication and to have a sleep medicine represcribed.  Review of his electronic medical records note that he has a history of dilated cardiomyopathy which since has recovered and his most recent LVEF is preserved.  He  is overall euvolemic on physical examination and denies symptoms of orthopnea, paroxysmal nocturnal dyspnea or lower extremity swelling.  He recently had bacteremia and was diagnosed with pulmonic valve endocarditis based on a transesophageal echocardiogram performed in September 2021 by Dr. Debara Pickett.  Since then he is also following up with infectious disease Dr.Comer and has received IV Ancef via PICC line for approximately 6 weeks according the patient, his last IV antibiotic dose was last Thursday.  He has not undergone a repeat transesophageal echocardiogram to see if pulmonic vegetation has resolved.  He currently is on peritoneal dialysis for end-stage renal disease and he is not anuric.  He follows up with nephrology regularly.  Patient states that his blood pressures are currently managed by his nephrologist and his home blood pressures are better controlled and per patient BP is usually around 130/80.  He checks it on a regular basis.  FUNCTIONAL STATUS: Restarted walking again since Saturday 03/07/2020 about 96mile on flat ground.   ALLERGIES: Allergies  Allergen Reactions  . Lisinopril Swelling and Anaphylaxis    Angioedema  Other reaction(s): Angioedema (ALLERGY/intolerance)  . Losartan Swelling and Anaphylaxis    MEDICATION LIST PRIOR TO VISIT: Current Meds  Medication Sig  . acetaminophen (MAPAP) 325 MG tablet Take 650 mg by mouth every 4 (four) hours as needed for mild pain.   Marland Kitchen amLODipine (NORVASC) 2.5 MG tablet Take 5 mg by mouth daily.   . B Complex-C-Folic Acid (DIALYVITE 191 PO) Take 1 tablet by mouth daily.  . calcitRIOL (ROCALTROL) 0.25 MCG capsule Take 0.5 mcg by mouth daily.   . diphenhydrAMINE HCl, Sleep, (SLEEPINAL MAXIMUM STRENGTH PO) Take 1 tablet by  mouth at bedtime.  . ferric citrate (AURYXIA) 1 GM 210 MG(Fe) tablet Take 840 mg by mouth 3 (three) times daily with meals.   Marland Kitchen gentamicin cream (GARAMYCIN) 0.1 % Apply 1 application topically as directed. Port site    . Methoxy PEG-Epoetin Beta (MIRCERA IJ) Inject into the skin.  Marland Kitchen senna-docusate (SENOKOT-S) 8.6-50 MG tablet Take 1 tablet by mouth 2 (two) times daily.     PAST MEDICAL HISTORY: Past Medical History:  Diagnosis Date  . Allergic rhinitis   . Allergy   . Anemia   . Asthma    as a child  . Benign essential hypertension   . Dilated idiopathic cardiomyopathy ([place])    now resoved with EF 55% by echo 2011  . Edema   . Encounter for blood transfusion   . ESRD (end stage renal disease) ([place])   . Heart disease   . Hyperlipidemia   . Hyperparathyroidism, secondary ([place])   . Hypertension   . Morbid obesity ([place])   . Sleep apnea    c-pap  . UGI bleed 2011   ASA    PAST SURGICAL HISTORY: Past Surgical History:  Procedure Laterality Date  . ACHILLES TENDON REPAIR     ruptured; right  . AV FISTULA PLACEMENT Left 11/03/2017   Procedure: ARTERIOVENOUS (AV) FISTULA CREATION  left upper arm;  Surgeon: Rosetta Posner, MD;  Location: [facility];  Service: Vascular;  Laterality: Left;  . DIALYSIS FISTULA CREATION    . INSERTION OF DIALYSIS CATHETER N/A 11/03/2017   Procedure: INSERTION OF DIALYSIS CATHETER - RIGHT INTERNAL JUGULAR PLACEMENT;  Surgeon: Rosetta Posner, MD;  Location: [facility];  Service: Vascular;  Laterality: N/A;  . IR FLUORO GUIDE CV LINE LEFT  01/24/2020  . IR REMOVAL TUN CV CATH W/O FL  01/21/2020  . IR REMOVAL TUN CV CATH W/O FL  03/06/2020  . IR US GUIDE VASC ACCESS LEFT  01/24/2020  . KIDNEY FAILURE    . LAPAROSCOPIC GASTROTOMY W/ REPAIR OF ULCER    . PLEURAL SCARIFICATION     left, football trauma-chest tube  . STOMACH SURGERY    . TEE WITHOUT CARDIOVERSION N/A 01/22/2020   Procedure: TRANSESOPHAGEAL ECHOCARDIOGRAM (TEE);  Surgeon: Pixie Casino, MD;  Location: [facility] ENDOSCOPY;  Service: Cardiovascular;  Laterality: N/A;  . TONSILLECTOMY    . TONSILLECTOMY    . UPPER GASTROINTESTINAL ENDOSCOPY      FAMILY HISTORY: The patient family history includes Diabetes in his mother;  Heart disease in his father; Heart failure in his mother; Hypertension in his brother, brother, and mother.  SOCIAL HISTORY:  The patient  reports that he quit smoking about 10 years ago. His smoking use included cigarettes. He quit after 6.00 years of use. He has never used smokeless tobacco. He reports previous alcohol use of about 2.0 standard drinks of alcohol per week. He reports that he does not use drugs.  REVIEW OF SYSTEMS: Review of Systems  Constitutional: Negative for chills and fever.  HENT: Negative for hoarse voice and nosebleeds.   Eyes: Negative for discharge, double vision and pain.  Cardiovascular: Negative for chest pain, claudication, dyspnea on exertion, leg swelling, near-syncope, orthopnea, palpitations, paroxysmal nocturnal dyspnea and syncope.  Respiratory: Negative for hemoptysis and shortness of breath.   Musculoskeletal: Negative for muscle cramps and myalgias.  Gastrointestinal: Negative for abdominal pain, constipation, diarrhea, hematemesis, hematochezia, melena, nausea and vomiting.  Neurological: Negative for dizziness and light-headedness.    PHYSICAL EXAM: Vitals with BMI 03/10/2020 03/10/2020  02/24/2020  Height - 5\' 7"  5\' 7"   Weight - 302 lbs 305 lbs  BMI - 27.78 24.23  Systolic 536 144 315  Diastolic 86 89 89  Pulse 92 96 93    CONSTITUTIONAL: Well-developed and well-nourished. No acute distress.  SKIN: Skin is warm and dry. No rash noted. No cyanosis. No pallor. No jaundice HEAD: Normocephalic and atraumatic.  EYES: No scleral icterus MOUTH/THROAT: Moist oral membranes.  NECK: No JVD present. No carotid bruits  LYMPHATIC: No visible cervical adenopathy.  CHEST Normal respiratory effort. No intercostal retractions  LUNGS: Clear to auscultation bilaterally.  No stridor. No wheezes. No rales.  CARDIOVASCULAR: Regular, positive Q0-G8, soft holosystolic murmur heard over the left sternal border, no gallops or rubs ABDOMINAL: Obese, soft,  nontender, nondistended, no apparent ascites.  EXTREMITIES: No peripheral edema  HEMATOLOGIC: No significant bruising NEUROLOGIC: Oriented to person, place, and time. Nonfocal. Normal muscle tone.  PSYCHIATRIC: Normal mood and affect. Normal behavior. Cooperative  CARDIAC DATABASE: EKG: 05/10/2020: Normal sinus rhythm, 94 bpm, poor R wave progression, consider old anterior infarct, without underlying injury pattern.    Echocardiogram: 01/20/2020:  1. Abnormal septal motion . Left ventricular ejection fraction, by estimation, is 50 to 55%. The left ventricle has low normal function. The left ventricle has no regional wall motion abnormalities. There is mild left ventricular hypertrophy. Left ventricular diastolic parameters were normal.  2. Right ventricular systolic function is normal. The right ventricular size is normal.  3. The mitral valve is normal in structure. No evidence of mitral valve regurgitation. No evidence of mitral stenosis.  4. The aortic valve is normal in structure. Aortic valve regurgitation is not visualized. No aortic stenosis is present.  5. The inferior vena cava is normal in size with greater than 50% respiratory variability, suggesting right atrial pressure of 3 mmHg.   TEE 01/22/2020 performed by Dr. Debara Pickett:  1. 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 No LAA thrombus 3. Negative for PFO by color doppler 4. LVEF 50-55%   Heart Catheterization: None  LABORATORY DATA: CBC Latest Ref Rng & Units 01/24/2020 01/23/2020 01/22/2020  WBC 4.0 - 10.5 K/uL 14.3(H) 15.1(H) 15.1(H)  Hemoglobin 13.0 - 17.0 g/dL 9.6(L) 9.4(L) 9.5(L)  Hematocrit 39 - 52 % 29.7(L) 28.9(L) 29.8(L)  Platelets 150 - 400 K/uL 124(L) 81(L) 70(L)    CMP Latest Ref Rng & Units 01/24/2020 01/23/2020 01/22/2020  Glucose 70 - 99 mg/dL 117(H) 121(H) 128(H)  BUN 6 - 20 mg/dL 79(H) 79(H) 77(H)  Creatinine 0.61 - 1.24 mg/dL 16.30(H) 16.14(H)  16.30(H)  Sodium 135 - 145 mmol/L 133(L) 130(L) 130(L)  Potassium 3.5 - 5.1 mmol/L 3.6 3.2(L) 3.4(L)  Chloride 98 - 111 mmol/L 92(L) 90(L) 90(L)  CO2 22 - 32 mmol/L 20(L) 20(L) 19(L)  Calcium 8.9 - 10.3 mg/dL 8.5(L) 8.2(L) 8.0(L)  Total Protein 6.5 - 8.1 g/dL - - -  Total Bilirubin 0.3 - 1.2 mg/dL - - -  Alkaline Phos 38 - 126 U/L - - -  AST 15 - 41 U/L - - -  ALT 0 - 44 U/L - - -    Lipid Panel  No results found for: CHOL, TRIG, HDL, CHOLHDL, VLDL, LDLCALC, LDLDIRECT, LABVLDL  No components found for: NTPROBNP No results for input(s): PROBNP in the last 8760 hours. No results for input(s): TSH in the last 8760 hours.  BMP Recent Labs    01/22/20 0729 01/23/20 0407 01/24/20 1303  NA  130* 130* 133*  K 3.4* 3.2* 3.6  CL 90* 90* 92*  CO2 19* 20* 20*  GLUCOSE 128* 121* 117*  BUN 77* 79* 79*  CREATININE 16.30* 16.14* 16.30*  CALCIUM 8.0* 8.2* 8.5*  GFRNONAA 3* 3* 3*  GFRAA 3* 4* 3*    HEMOGLOBIN A1C Lab Results  Component Value Date   HGBA1C 4.7 (L) 11/02/2017   MPG 88.19 11/02/2017    IMPRESSION:    ICD-10-CM   1. Essential hypertension  I10 EKG 12-Lead  2. Dilated idiopathic cardiomyopathy (HCC)  I42.0   3. Chronic diastolic heart failure (HCC)  I50.32   4. 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 ESRD on peritoneal dialysis ([place])  N18.6    Z99.2   6. Morbid obesity ([place])  E66.01   7. OSA on CPAP  G47.33    Z99.89      RECOMMENDATIONS: Theoren Palka is a 49 y.o. male whose past medical history and cardiac risk factors include: History of dilated cardiomyopathy, hypertension, diabetes, end-stage renal disease on peritoneal dialysis, history of diastolic heart failure, OSA on CPAP, history of bacteremia status post pulmonic valve endocarditis, obesity due to excess calories, former smoker.   Up until now patient's primary cardiologist has been Dr. Golden Hurter from [facility] and patient is here to reestablish care.  He  wants to initiate erectile dysfunction medication and wants to have his sleep medications refilled as well.  I informed him that I am not board-certified in sleep medicine and do not manage OSA and defer refills of such medications to either his PCP or he will need to establish care with sleep medicine.   With regards to initiating erectile dysfunction medication I first recommended that he needs to have an ischemic evaluation given his symptoms and cardiovascular risk factors.  I recommended to undergo a nuclear stress test to evaluate for reversible ischemia and if considered low risk he needs to still see urology to rule out other organic causes prior to starting ED medications.  Patient refuses to undergo stress test at this time and will call us back if he choose to proceed.   Patient recently had bacteremia and pulmonic valve vegetation.  He has been on IV antibiotics via PICC line and has completed its course.  He follows up with infectious disease.  I recommended to repeat a transesophageal echocardiogram to ensure that the vegetation is resolved and no additional IV antibiotics is needed at this time.  Patient refuses to undergo TEE to evaluate the absence of pulmonic valve vegetation.  I also offered to speak to his infectious disease provider to see if TEE is medically necessary.  Patient does not want me to contact his infectious disease provider on his behalf to discuss this further.  Patient's blood pressure is elevated at today's office visit.  However, he states that his home blood pressure is very well controlled.  Antihypertensive medications reconciled.  Patient states that he follows up with nephrologist for the management of hypertension.  Will defer further management to nephrology at this time at the patient's request.  Review of electronic medical records notes that he has history of dilated cardiomyopathy which has recovered he also has chronic diastolic heart failure.  We discussed  guideline directed medical therapy as he is currently not on ACE inhibitor/ARB, beta-blockers, spironolactone etc.  Patient does not want to uptitrate his medical therapy at this time.  I would like to see him back  in a month to see if he has any questions regarding what we have discussed today or sooner if he chooses.  FINAL MEDICATION LIST END OF ENCOUNTER: No orders of the defined types were placed in this encounter.   There are no discontinued medications.   Current Outpatient Medications:  .  acetaminophen (MAPAP) 325 MG tablet, Take 650 mg by mouth every 4 (four) hours as needed for mild pain. , Disp: , Rfl:  .  amLODipine (NORVASC) 2.5 MG tablet, Take 5 mg by mouth daily. , Disp: , Rfl:  .  B Complex-C-Folic Acid (DIALYVITE 742 PO), Take 1 tablet by mouth daily., Disp: , Rfl:  .  calcitRIOL (ROCALTROL) 0.25 MCG capsule, Take 0.5 mcg by mouth daily. , Disp: , Rfl:  .  diphenhydrAMINE HCl, Sleep, (SLEEPINAL MAXIMUM STRENGTH PO), Take 1 tablet by mouth at bedtime., Disp: , Rfl:  .  ferric citrate (AURYXIA) 1 GM 210 MG(Fe) tablet, Take 840 mg by mouth 3 (three) times daily with meals. , Disp: , Rfl:  .  gentamicin cream (GARAMYCIN) 0.1 %, Apply 1 application topically as directed. Port site, Disp: , Rfl:  .  Methoxy PEG-Epoetin Beta (MIRCERA IJ), Inject into the skin., Disp: , Rfl:  .  senna-docusate (SENOKOT-S) 8.6-50 MG tablet, Take 1 tablet by mouth 2 (two) times daily., Disp: 30 tablet, Rfl: 0 .  ceFAZolin (ANCEF) 10 g injection, , Disp: , Rfl:  .  Elderberry 575 MG/5ML SYRP, Take 3,450 mg by mouth every 4 (four) hours.  (Patient not taking: Reported on 02/07/2020), Disp: , Rfl:  .  triamcinolone cream (KENALOG) 0.1 %, Apply 1 application topically daily as needed (rash). , Disp: , Rfl:   Orders Placed This Encounter  Procedures  . EKG 12-Lead    There are no Patient Instructions on file for this visit.   --Continue cardiac medications as reconciled in final medication  list. --Return in about 4 weeks (around 04/07/2020) for Reevaluation of diastolic HF and hx of DCM.Marland Kitchen Or sooner if needed. --Continue follow-up with your primary care physician regarding the management of your other chronic comorbid conditions.  Patient's questions and concerns were addressed to his satisfaction. He voices understanding of the instructions provided during this encounter.   This note was created using a voice recognition software as a result there may be grammatical errors inadvertently enclosed that do not reflect the nature of this encounter. Every attempt is made to correct such errors.  Adrian Frye, [place], [facility]  Pager: [phone] Office: [phone]

## 2020-03-11 ENCOUNTER — Encounter: Payer: Self-pay | Admitting: Family Medicine

## 2020-03-11 DIAGNOSIS — N2581 Secondary hyperparathyroidism of renal origin: Secondary | ICD-10-CM | POA: Diagnosis not present

## 2020-03-11 DIAGNOSIS — I10 Essential (primary) hypertension: Secondary | ICD-10-CM

## 2020-03-11 DIAGNOSIS — I5032 Chronic diastolic (congestive) heart failure: Secondary | ICD-10-CM

## 2020-03-11 DIAGNOSIS — N529 Male erectile dysfunction, unspecified: Secondary | ICD-10-CM

## 2020-03-11 DIAGNOSIS — Z992 Dependence on renal dialysis: Secondary | ICD-10-CM | POA: Diagnosis not present

## 2020-03-11 DIAGNOSIS — N186 End stage renal disease: Secondary | ICD-10-CM | POA: Diagnosis not present

## 2020-03-11 DIAGNOSIS — Z789 Other specified health status: Secondary | ICD-10-CM

## 2020-03-12 DIAGNOSIS — N2581 Secondary hyperparathyroidism of renal origin: Secondary | ICD-10-CM | POA: Diagnosis not present

## 2020-03-12 DIAGNOSIS — N186 End stage renal disease: Secondary | ICD-10-CM | POA: Diagnosis not present

## 2020-03-12 DIAGNOSIS — Z992 Dependence on renal dialysis: Secondary | ICD-10-CM | POA: Diagnosis not present

## 2020-03-13 DIAGNOSIS — N186 End stage renal disease: Secondary | ICD-10-CM | POA: Diagnosis not present

## 2020-03-13 DIAGNOSIS — N2581 Secondary hyperparathyroidism of renal origin: Secondary | ICD-10-CM | POA: Diagnosis not present

## 2020-03-13 DIAGNOSIS — Z992 Dependence on renal dialysis: Secondary | ICD-10-CM | POA: Diagnosis not present

## 2020-03-14 DIAGNOSIS — Z992 Dependence on renal dialysis: Secondary | ICD-10-CM | POA: Diagnosis not present

## 2020-03-14 DIAGNOSIS — N2581 Secondary hyperparathyroidism of renal origin: Secondary | ICD-10-CM | POA: Diagnosis not present

## 2020-03-14 DIAGNOSIS — N186 End stage renal disease: Secondary | ICD-10-CM | POA: Diagnosis not present

## 2020-03-15 DIAGNOSIS — Z992 Dependence on renal dialysis: Secondary | ICD-10-CM | POA: Diagnosis not present

## 2020-03-15 DIAGNOSIS — N186 End stage renal disease: Secondary | ICD-10-CM | POA: Diagnosis not present

## 2020-03-15 DIAGNOSIS — N2581 Secondary hyperparathyroidism of renal origin: Secondary | ICD-10-CM | POA: Diagnosis not present

## 2020-03-15 DIAGNOSIS — E1122 Type 2 diabetes mellitus with diabetic chronic kidney disease: Secondary | ICD-10-CM | POA: Diagnosis not present

## 2020-03-16 DIAGNOSIS — Z992 Dependence on renal dialysis: Secondary | ICD-10-CM | POA: Diagnosis not present

## 2020-03-16 DIAGNOSIS — N186 End stage renal disease: Secondary | ICD-10-CM | POA: Diagnosis not present

## 2020-03-16 DIAGNOSIS — N2581 Secondary hyperparathyroidism of renal origin: Secondary | ICD-10-CM | POA: Diagnosis not present

## 2020-03-17 DIAGNOSIS — N186 End stage renal disease: Secondary | ICD-10-CM | POA: Diagnosis not present

## 2020-03-17 DIAGNOSIS — N2581 Secondary hyperparathyroidism of renal origin: Secondary | ICD-10-CM | POA: Diagnosis not present

## 2020-03-17 DIAGNOSIS — Z992 Dependence on renal dialysis: Secondary | ICD-10-CM | POA: Diagnosis not present

## 2020-03-18 DIAGNOSIS — N2581 Secondary hyperparathyroidism of renal origin: Secondary | ICD-10-CM | POA: Diagnosis not present

## 2020-03-18 DIAGNOSIS — N186 End stage renal disease: Secondary | ICD-10-CM | POA: Diagnosis not present

## 2020-03-18 DIAGNOSIS — Z992 Dependence on renal dialysis: Secondary | ICD-10-CM | POA: Diagnosis not present

## 2020-03-19 DIAGNOSIS — N2581 Secondary hyperparathyroidism of renal origin: Secondary | ICD-10-CM | POA: Diagnosis not present

## 2020-03-19 DIAGNOSIS — N186 End stage renal disease: Secondary | ICD-10-CM | POA: Diagnosis not present

## 2020-03-19 DIAGNOSIS — Z992 Dependence on renal dialysis: Secondary | ICD-10-CM | POA: Diagnosis not present

## 2020-03-20 DIAGNOSIS — N186 End stage renal disease: Secondary | ICD-10-CM | POA: Diagnosis not present

## 2020-03-20 DIAGNOSIS — Z992 Dependence on renal dialysis: Secondary | ICD-10-CM | POA: Diagnosis not present

## 2020-03-20 DIAGNOSIS — N2581 Secondary hyperparathyroidism of renal origin: Secondary | ICD-10-CM | POA: Diagnosis not present

## 2020-03-21 DIAGNOSIS — N2581 Secondary hyperparathyroidism of renal origin: Secondary | ICD-10-CM | POA: Diagnosis not present

## 2020-03-21 DIAGNOSIS — N186 End stage renal disease: Secondary | ICD-10-CM | POA: Diagnosis not present

## 2020-03-21 DIAGNOSIS — Z992 Dependence on renal dialysis: Secondary | ICD-10-CM | POA: Diagnosis not present

## 2020-03-22 DIAGNOSIS — Z992 Dependence on renal dialysis: Secondary | ICD-10-CM | POA: Diagnosis not present

## 2020-03-22 DIAGNOSIS — N186 End stage renal disease: Secondary | ICD-10-CM | POA: Diagnosis not present

## 2020-03-22 DIAGNOSIS — N2581 Secondary hyperparathyroidism of renal origin: Secondary | ICD-10-CM | POA: Diagnosis not present

## 2020-03-23 DIAGNOSIS — N2581 Secondary hyperparathyroidism of renal origin: Secondary | ICD-10-CM | POA: Diagnosis not present

## 2020-03-23 DIAGNOSIS — Z992 Dependence on renal dialysis: Secondary | ICD-10-CM | POA: Diagnosis not present

## 2020-03-23 DIAGNOSIS — N186 End stage renal disease: Secondary | ICD-10-CM | POA: Diagnosis not present

## 2020-03-24 DIAGNOSIS — N2581 Secondary hyperparathyroidism of renal origin: Secondary | ICD-10-CM | POA: Diagnosis not present

## 2020-03-24 DIAGNOSIS — N186 End stage renal disease: Secondary | ICD-10-CM | POA: Diagnosis not present

## 2020-03-24 DIAGNOSIS — Z992 Dependence on renal dialysis: Secondary | ICD-10-CM | POA: Diagnosis not present

## 2020-03-25 DIAGNOSIS — N2581 Secondary hyperparathyroidism of renal origin: Secondary | ICD-10-CM | POA: Diagnosis not present

## 2020-03-25 DIAGNOSIS — N186 End stage renal disease: Secondary | ICD-10-CM | POA: Diagnosis not present

## 2020-03-25 DIAGNOSIS — Z992 Dependence on renal dialysis: Secondary | ICD-10-CM | POA: Diagnosis not present

## 2020-03-26 DIAGNOSIS — Z992 Dependence on renal dialysis: Secondary | ICD-10-CM | POA: Diagnosis not present

## 2020-03-26 DIAGNOSIS — N186 End stage renal disease: Secondary | ICD-10-CM | POA: Diagnosis not present

## 2020-03-26 DIAGNOSIS — N2581 Secondary hyperparathyroidism of renal origin: Secondary | ICD-10-CM | POA: Diagnosis not present

## 2020-03-27 DIAGNOSIS — N186 End stage renal disease: Secondary | ICD-10-CM | POA: Diagnosis not present

## 2020-03-27 DIAGNOSIS — N2581 Secondary hyperparathyroidism of renal origin: Secondary | ICD-10-CM | POA: Diagnosis not present

## 2020-03-27 DIAGNOSIS — Z992 Dependence on renal dialysis: Secondary | ICD-10-CM | POA: Diagnosis not present

## 2020-03-28 DIAGNOSIS — Z992 Dependence on renal dialysis: Secondary | ICD-10-CM | POA: Diagnosis not present

## 2020-03-28 DIAGNOSIS — N186 End stage renal disease: Secondary | ICD-10-CM | POA: Diagnosis not present

## 2020-03-28 DIAGNOSIS — N2581 Secondary hyperparathyroidism of renal origin: Secondary | ICD-10-CM | POA: Diagnosis not present

## 2020-03-29 DIAGNOSIS — Z992 Dependence on renal dialysis: Secondary | ICD-10-CM | POA: Diagnosis not present

## 2020-03-29 DIAGNOSIS — N2581 Secondary hyperparathyroidism of renal origin: Secondary | ICD-10-CM | POA: Diagnosis not present

## 2020-03-29 DIAGNOSIS — N186 End stage renal disease: Secondary | ICD-10-CM | POA: Diagnosis not present

## 2020-03-30 DIAGNOSIS — N186 End stage renal disease: Secondary | ICD-10-CM | POA: Diagnosis not present

## 2020-03-30 DIAGNOSIS — Z992 Dependence on renal dialysis: Secondary | ICD-10-CM | POA: Diagnosis not present

## 2020-03-30 DIAGNOSIS — N2581 Secondary hyperparathyroidism of renal origin: Secondary | ICD-10-CM | POA: Diagnosis not present

## 2020-03-31 DIAGNOSIS — Z992 Dependence on renal dialysis: Secondary | ICD-10-CM | POA: Diagnosis not present

## 2020-03-31 DIAGNOSIS — N2581 Secondary hyperparathyroidism of renal origin: Secondary | ICD-10-CM | POA: Diagnosis not present

## 2020-03-31 DIAGNOSIS — N186 End stage renal disease: Secondary | ICD-10-CM | POA: Diagnosis not present

## 2020-04-01 DIAGNOSIS — Z992 Dependence on renal dialysis: Secondary | ICD-10-CM | POA: Diagnosis not present

## 2020-04-01 DIAGNOSIS — N186 End stage renal disease: Secondary | ICD-10-CM | POA: Diagnosis not present

## 2020-04-01 DIAGNOSIS — N2581 Secondary hyperparathyroidism of renal origin: Secondary | ICD-10-CM | POA: Diagnosis not present

## 2020-04-02 DIAGNOSIS — Z992 Dependence on renal dialysis: Secondary | ICD-10-CM | POA: Diagnosis not present

## 2020-04-02 DIAGNOSIS — N2581 Secondary hyperparathyroidism of renal origin: Secondary | ICD-10-CM | POA: Diagnosis not present

## 2020-04-02 DIAGNOSIS — N186 End stage renal disease: Secondary | ICD-10-CM | POA: Diagnosis not present

## 2020-04-03 DIAGNOSIS — N186 End stage renal disease: Secondary | ICD-10-CM | POA: Diagnosis not present

## 2020-04-03 DIAGNOSIS — Z992 Dependence on renal dialysis: Secondary | ICD-10-CM | POA: Diagnosis not present

## 2020-04-03 DIAGNOSIS — N2581 Secondary hyperparathyroidism of renal origin: Secondary | ICD-10-CM | POA: Diagnosis not present

## 2020-04-04 DIAGNOSIS — N2581 Secondary hyperparathyroidism of renal origin: Secondary | ICD-10-CM | POA: Diagnosis not present

## 2020-04-04 DIAGNOSIS — Z992 Dependence on renal dialysis: Secondary | ICD-10-CM | POA: Diagnosis not present

## 2020-04-04 DIAGNOSIS — N186 End stage renal disease: Secondary | ICD-10-CM | POA: Diagnosis not present

## 2020-04-05 DIAGNOSIS — N2581 Secondary hyperparathyroidism of renal origin: Secondary | ICD-10-CM | POA: Diagnosis not present

## 2020-04-05 DIAGNOSIS — Z992 Dependence on renal dialysis: Secondary | ICD-10-CM | POA: Diagnosis not present

## 2020-04-05 DIAGNOSIS — N186 End stage renal disease: Secondary | ICD-10-CM | POA: Diagnosis not present

## 2020-04-06 DIAGNOSIS — Z992 Dependence on renal dialysis: Secondary | ICD-10-CM | POA: Diagnosis not present

## 2020-04-06 DIAGNOSIS — N2581 Secondary hyperparathyroidism of renal origin: Secondary | ICD-10-CM | POA: Diagnosis not present

## 2020-04-06 DIAGNOSIS — N186 End stage renal disease: Secondary | ICD-10-CM | POA: Diagnosis not present

## 2020-04-07 DIAGNOSIS — N186 End stage renal disease: Secondary | ICD-10-CM | POA: Diagnosis not present

## 2020-04-07 DIAGNOSIS — Z992 Dependence on renal dialysis: Secondary | ICD-10-CM | POA: Diagnosis not present

## 2020-04-07 DIAGNOSIS — N2581 Secondary hyperparathyroidism of renal origin: Secondary | ICD-10-CM | POA: Diagnosis not present

## 2020-04-08 DIAGNOSIS — Z992 Dependence on renal dialysis: Secondary | ICD-10-CM | POA: Diagnosis not present

## 2020-04-08 DIAGNOSIS — N186 End stage renal disease: Secondary | ICD-10-CM | POA: Diagnosis not present

## 2020-04-08 DIAGNOSIS — N2581 Secondary hyperparathyroidism of renal origin: Secondary | ICD-10-CM | POA: Diagnosis not present

## 2020-04-09 DIAGNOSIS — N186 End stage renal disease: Secondary | ICD-10-CM | POA: Diagnosis not present

## 2020-04-09 DIAGNOSIS — Z992 Dependence on renal dialysis: Secondary | ICD-10-CM | POA: Diagnosis not present

## 2020-04-09 DIAGNOSIS — N2581 Secondary hyperparathyroidism of renal origin: Secondary | ICD-10-CM | POA: Diagnosis not present

## 2020-04-10 DIAGNOSIS — Z992 Dependence on renal dialysis: Secondary | ICD-10-CM | POA: Diagnosis not present

## 2020-04-10 DIAGNOSIS — N2581 Secondary hyperparathyroidism of renal origin: Secondary | ICD-10-CM | POA: Diagnosis not present

## 2020-04-10 DIAGNOSIS — N186 End stage renal disease: Secondary | ICD-10-CM | POA: Diagnosis not present

## 2020-04-11 DIAGNOSIS — Z992 Dependence on renal dialysis: Secondary | ICD-10-CM | POA: Diagnosis not present

## 2020-04-11 DIAGNOSIS — N2581 Secondary hyperparathyroidism of renal origin: Secondary | ICD-10-CM | POA: Diagnosis not present

## 2020-04-11 DIAGNOSIS — N186 End stage renal disease: Secondary | ICD-10-CM | POA: Diagnosis not present

## 2020-04-12 DIAGNOSIS — N186 End stage renal disease: Secondary | ICD-10-CM | POA: Diagnosis not present

## 2020-04-12 DIAGNOSIS — Z992 Dependence on renal dialysis: Secondary | ICD-10-CM | POA: Diagnosis not present

## 2020-04-12 DIAGNOSIS — N2581 Secondary hyperparathyroidism of renal origin: Secondary | ICD-10-CM | POA: Diagnosis not present

## 2020-04-13 DIAGNOSIS — N186 End stage renal disease: Secondary | ICD-10-CM | POA: Diagnosis not present

## 2020-04-13 DIAGNOSIS — N2581 Secondary hyperparathyroidism of renal origin: Secondary | ICD-10-CM | POA: Diagnosis not present

## 2020-04-13 DIAGNOSIS — Z992 Dependence on renal dialysis: Secondary | ICD-10-CM | POA: Diagnosis not present

## 2020-04-14 DIAGNOSIS — N186 End stage renal disease: Secondary | ICD-10-CM | POA: Diagnosis not present

## 2020-04-14 DIAGNOSIS — Z992 Dependence on renal dialysis: Secondary | ICD-10-CM | POA: Diagnosis not present

## 2020-04-14 DIAGNOSIS — E1122 Type 2 diabetes mellitus with diabetic chronic kidney disease: Secondary | ICD-10-CM | POA: Diagnosis not present

## 2020-04-14 DIAGNOSIS — N2581 Secondary hyperparathyroidism of renal origin: Secondary | ICD-10-CM | POA: Diagnosis not present

## 2020-04-15 DIAGNOSIS — N186 End stage renal disease: Secondary | ICD-10-CM | POA: Diagnosis not present

## 2020-04-15 DIAGNOSIS — Z992 Dependence on renal dialysis: Secondary | ICD-10-CM | POA: Diagnosis not present

## 2020-04-15 DIAGNOSIS — N2581 Secondary hyperparathyroidism of renal origin: Secondary | ICD-10-CM | POA: Diagnosis not present

## 2020-04-16 DIAGNOSIS — N186 End stage renal disease: Secondary | ICD-10-CM | POA: Diagnosis not present

## 2020-04-16 DIAGNOSIS — N2581 Secondary hyperparathyroidism of renal origin: Secondary | ICD-10-CM | POA: Diagnosis not present

## 2020-04-16 DIAGNOSIS — Z992 Dependence on renal dialysis: Secondary | ICD-10-CM | POA: Diagnosis not present

## 2020-04-17 DIAGNOSIS — N186 End stage renal disease: Secondary | ICD-10-CM | POA: Diagnosis not present

## 2020-04-17 DIAGNOSIS — N2581 Secondary hyperparathyroidism of renal origin: Secondary | ICD-10-CM | POA: Diagnosis not present

## 2020-04-17 DIAGNOSIS — Z992 Dependence on renal dialysis: Secondary | ICD-10-CM | POA: Diagnosis not present

## 2020-04-18 DIAGNOSIS — N186 End stage renal disease: Secondary | ICD-10-CM | POA: Diagnosis not present

## 2020-04-18 DIAGNOSIS — Z992 Dependence on renal dialysis: Secondary | ICD-10-CM | POA: Diagnosis not present

## 2020-04-18 DIAGNOSIS — N2581 Secondary hyperparathyroidism of renal origin: Secondary | ICD-10-CM | POA: Diagnosis not present

## 2020-04-19 DIAGNOSIS — N186 End stage renal disease: Secondary | ICD-10-CM | POA: Diagnosis not present

## 2020-04-19 DIAGNOSIS — N2581 Secondary hyperparathyroidism of renal origin: Secondary | ICD-10-CM | POA: Diagnosis not present

## 2020-04-19 DIAGNOSIS — Z992 Dependence on renal dialysis: Secondary | ICD-10-CM | POA: Diagnosis not present

## 2020-04-20 DIAGNOSIS — Z992 Dependence on renal dialysis: Secondary | ICD-10-CM | POA: Diagnosis not present

## 2020-04-20 DIAGNOSIS — G4700 Insomnia, unspecified: Secondary | ICD-10-CM | POA: Diagnosis not present

## 2020-04-20 DIAGNOSIS — N2581 Secondary hyperparathyroidism of renal origin: Secondary | ICD-10-CM | POA: Diagnosis not present

## 2020-04-20 DIAGNOSIS — G4733 Obstructive sleep apnea (adult) (pediatric): Secondary | ICD-10-CM | POA: Diagnosis not present

## 2020-04-20 DIAGNOSIS — N186 End stage renal disease: Secondary | ICD-10-CM | POA: Diagnosis not present

## 2020-04-21 DIAGNOSIS — N2581 Secondary hyperparathyroidism of renal origin: Secondary | ICD-10-CM | POA: Diagnosis not present

## 2020-04-21 DIAGNOSIS — N186 End stage renal disease: Secondary | ICD-10-CM | POA: Diagnosis not present

## 2020-04-21 DIAGNOSIS — Z992 Dependence on renal dialysis: Secondary | ICD-10-CM | POA: Diagnosis not present

## 2020-04-22 DIAGNOSIS — Z992 Dependence on renal dialysis: Secondary | ICD-10-CM | POA: Diagnosis not present

## 2020-04-22 DIAGNOSIS — N186 End stage renal disease: Secondary | ICD-10-CM | POA: Diagnosis not present

## 2020-04-22 DIAGNOSIS — N2581 Secondary hyperparathyroidism of renal origin: Secondary | ICD-10-CM | POA: Diagnosis not present

## 2020-04-23 DIAGNOSIS — N186 End stage renal disease: Secondary | ICD-10-CM | POA: Diagnosis not present

## 2020-04-23 DIAGNOSIS — Z992 Dependence on renal dialysis: Secondary | ICD-10-CM | POA: Diagnosis not present

## 2020-04-23 DIAGNOSIS — N2581 Secondary hyperparathyroidism of renal origin: Secondary | ICD-10-CM | POA: Diagnosis not present

## 2020-04-24 DIAGNOSIS — Z992 Dependence on renal dialysis: Secondary | ICD-10-CM | POA: Diagnosis not present

## 2020-04-24 DIAGNOSIS — N2581 Secondary hyperparathyroidism of renal origin: Secondary | ICD-10-CM | POA: Diagnosis not present

## 2020-04-24 DIAGNOSIS — N186 End stage renal disease: Secondary | ICD-10-CM | POA: Diagnosis not present

## 2020-04-25 DIAGNOSIS — Z992 Dependence on renal dialysis: Secondary | ICD-10-CM | POA: Diagnosis not present

## 2020-04-25 DIAGNOSIS — N186 End stage renal disease: Secondary | ICD-10-CM | POA: Diagnosis not present

## 2020-04-25 DIAGNOSIS — N2581 Secondary hyperparathyroidism of renal origin: Secondary | ICD-10-CM | POA: Diagnosis not present

## 2020-04-26 DIAGNOSIS — Z992 Dependence on renal dialysis: Secondary | ICD-10-CM | POA: Diagnosis not present

## 2020-04-26 DIAGNOSIS — N2581 Secondary hyperparathyroidism of renal origin: Secondary | ICD-10-CM | POA: Diagnosis not present

## 2020-04-26 DIAGNOSIS — N186 End stage renal disease: Secondary | ICD-10-CM | POA: Diagnosis not present

## 2020-04-27 DIAGNOSIS — Z992 Dependence on renal dialysis: Secondary | ICD-10-CM | POA: Diagnosis not present

## 2020-04-27 DIAGNOSIS — N186 End stage renal disease: Secondary | ICD-10-CM | POA: Diagnosis not present

## 2020-04-27 DIAGNOSIS — N2581 Secondary hyperparathyroidism of renal origin: Secondary | ICD-10-CM | POA: Diagnosis not present

## 2020-04-28 DIAGNOSIS — N186 End stage renal disease: Secondary | ICD-10-CM | POA: Diagnosis not present

## 2020-04-28 DIAGNOSIS — N2581 Secondary hyperparathyroidism of renal origin: Secondary | ICD-10-CM | POA: Diagnosis not present

## 2020-04-28 DIAGNOSIS — Z992 Dependence on renal dialysis: Secondary | ICD-10-CM | POA: Diagnosis not present

## 2020-04-29 DIAGNOSIS — Z992 Dependence on renal dialysis: Secondary | ICD-10-CM | POA: Diagnosis not present

## 2020-04-29 DIAGNOSIS — N186 End stage renal disease: Secondary | ICD-10-CM | POA: Diagnosis not present

## 2020-04-29 DIAGNOSIS — N2581 Secondary hyperparathyroidism of renal origin: Secondary | ICD-10-CM | POA: Diagnosis not present

## 2020-04-30 DIAGNOSIS — Z992 Dependence on renal dialysis: Secondary | ICD-10-CM | POA: Diagnosis not present

## 2020-04-30 DIAGNOSIS — N2581 Secondary hyperparathyroidism of renal origin: Secondary | ICD-10-CM | POA: Diagnosis not present

## 2020-04-30 DIAGNOSIS — N186 End stage renal disease: Secondary | ICD-10-CM | POA: Diagnosis not present

## 2020-05-01 ENCOUNTER — Encounter: Payer: Self-pay | Admitting: Family Medicine

## 2020-05-01 DIAGNOSIS — N2581 Secondary hyperparathyroidism of renal origin: Secondary | ICD-10-CM | POA: Diagnosis not present

## 2020-05-01 DIAGNOSIS — Z992 Dependence on renal dialysis: Secondary | ICD-10-CM | POA: Diagnosis not present

## 2020-05-01 DIAGNOSIS — N186 End stage renal disease: Secondary | ICD-10-CM | POA: Diagnosis not present

## 2020-05-02 DIAGNOSIS — Z992 Dependence on renal dialysis: Secondary | ICD-10-CM | POA: Diagnosis not present

## 2020-05-02 DIAGNOSIS — N2581 Secondary hyperparathyroidism of renal origin: Secondary | ICD-10-CM | POA: Diagnosis not present

## 2020-05-02 DIAGNOSIS — N186 End stage renal disease: Secondary | ICD-10-CM | POA: Diagnosis not present

## 2020-05-03 DIAGNOSIS — N2581 Secondary hyperparathyroidism of renal origin: Secondary | ICD-10-CM | POA: Diagnosis not present

## 2020-05-03 DIAGNOSIS — Z992 Dependence on renal dialysis: Secondary | ICD-10-CM | POA: Diagnosis not present

## 2020-05-03 DIAGNOSIS — N186 End stage renal disease: Secondary | ICD-10-CM | POA: Diagnosis not present

## 2020-05-04 DIAGNOSIS — N2581 Secondary hyperparathyroidism of renal origin: Secondary | ICD-10-CM | POA: Diagnosis not present

## 2020-05-04 DIAGNOSIS — N186 End stage renal disease: Secondary | ICD-10-CM | POA: Diagnosis not present

## 2020-05-04 DIAGNOSIS — Z992 Dependence on renal dialysis: Secondary | ICD-10-CM | POA: Diagnosis not present

## 2020-05-05 ENCOUNTER — Ambulatory Visit (INDEPENDENT_AMBULATORY_CARE_PROVIDER_SITE_OTHER): Payer: Federal, State, Local not specified - PPO | Admitting: Family Medicine

## 2020-05-05 ENCOUNTER — Other Ambulatory Visit: Payer: Self-pay

## 2020-05-05 ENCOUNTER — Encounter: Payer: Self-pay | Admitting: Family Medicine

## 2020-05-05 VITALS — BP 150/100 | HR 88 | Temp 98.7°F | Wt 307.2 lb

## 2020-05-05 DIAGNOSIS — Z992 Dependence on renal dialysis: Secondary | ICD-10-CM | POA: Diagnosis not present

## 2020-05-05 DIAGNOSIS — S8991XA Unspecified injury of right lower leg, initial encounter: Secondary | ICD-10-CM | POA: Diagnosis not present

## 2020-05-05 DIAGNOSIS — N2581 Secondary hyperparathyroidism of renal origin: Secondary | ICD-10-CM | POA: Diagnosis not present

## 2020-05-05 DIAGNOSIS — L03115 Cellulitis of right lower limb: Secondary | ICD-10-CM

## 2020-05-05 DIAGNOSIS — N186 End stage renal disease: Secondary | ICD-10-CM | POA: Diagnosis not present

## 2020-05-05 MED ORDER — DOXYCYCLINE HYCLATE 100 MG PO TABS: 100.0000 mg | ORAL_TABLET | Freq: Two times a day (BID) | ORAL | 0 refills | Status: DC
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
## 2020-05-06 DIAGNOSIS — N186 End stage renal disease: Secondary | ICD-10-CM | POA: Diagnosis not present

## 2020-05-06 DIAGNOSIS — N2581 Secondary hyperparathyroidism of renal origin: Secondary | ICD-10-CM | POA: Diagnosis not present

## 2020-05-06 DIAGNOSIS — Z992 Dependence on renal dialysis: Secondary | ICD-10-CM | POA: Diagnosis not present

## 2020-05-07 DIAGNOSIS — Z992 Dependence on renal dialysis: Secondary | ICD-10-CM | POA: Diagnosis not present

## 2020-05-07 DIAGNOSIS — N186 End stage renal disease: Secondary | ICD-10-CM | POA: Diagnosis not present

## 2020-05-07 DIAGNOSIS — N2581 Secondary hyperparathyroidism of renal origin: Secondary | ICD-10-CM | POA: Diagnosis not present

## 2020-05-08 DIAGNOSIS — N186 End stage renal disease: Secondary | ICD-10-CM | POA: Diagnosis not present

## 2020-05-08 DIAGNOSIS — Z992 Dependence on renal dialysis: Secondary | ICD-10-CM | POA: Diagnosis not present

## 2020-05-08 DIAGNOSIS — N2581 Secondary hyperparathyroidism of renal origin: Secondary | ICD-10-CM | POA: Diagnosis not present

## 2020-05-09 DIAGNOSIS — N186 End stage renal disease: Secondary | ICD-10-CM | POA: Diagnosis not present

## 2020-05-09 DIAGNOSIS — N2581 Secondary hyperparathyroidism of renal origin: Secondary | ICD-10-CM | POA: Diagnosis not present

## 2020-05-09 DIAGNOSIS — Z992 Dependence on renal dialysis: Secondary | ICD-10-CM | POA: Diagnosis not present

## 2020-05-10 DIAGNOSIS — N186 End stage renal disease: Secondary | ICD-10-CM | POA: Diagnosis not present

## 2020-05-10 DIAGNOSIS — Z992 Dependence on renal dialysis: Secondary | ICD-10-CM | POA: Diagnosis not present

## 2020-05-10 DIAGNOSIS — N2581 Secondary hyperparathyroidism of renal origin: Secondary | ICD-10-CM | POA: Diagnosis not present

## 2020-05-11 ENCOUNTER — Ambulatory Visit: Payer: Federal, State, Local not specified - PPO | Admitting: Family Medicine

## 2020-05-11 ENCOUNTER — Encounter: Payer: Self-pay | Admitting: Family Medicine

## 2020-05-11 VITALS — BP 128/84 | HR 68 | Temp 97.6°F | Wt 306.6 lb

## 2020-05-11 DIAGNOSIS — N186 End stage renal disease: Secondary | ICD-10-CM | POA: Diagnosis not present

## 2020-05-11 DIAGNOSIS — Z992 Dependence on renal dialysis: Secondary | ICD-10-CM | POA: Diagnosis not present

## 2020-05-11 DIAGNOSIS — N2581 Secondary hyperparathyroidism of renal origin: Secondary | ICD-10-CM | POA: Diagnosis not present

## 2020-05-11 DIAGNOSIS — Z09 Encounter for follow-up examination after completed treatment for conditions other than malignant neoplasm: Secondary | ICD-10-CM

## 2020-05-11 NOTE — Progress Notes (Signed)
   Subjective:    Patient ID: Adrian Frye, male    DOB: 1971/03/27, 49 y.o.   MRN: [id number]  HPI Chief Complaint  Patient presents with  . other    F/u on rt. Leg wound getting better now   He is here for a 1 week follow-up exam on right lower leg cellulitis.  He has been taking doxycycline and doing well.  His leg is improving.  Denies pain.  No fever, chills, nausea, vomiting   Review of Systems Pertinent positives and negatives in the history of present illness.     Objective:   Physical Exam BP 128/84   Pulse 68   Temp 97.6 F (36.4 C)   Wt (!) 306 lb 9.6 oz (139.1 kg)   BMI 48.02 kg/m   Right lower leg without erythema, edema or signs of infection.      Assessment & Plan:  Follow up  Significant improvement and he will complete the doxycycline.  Follow-up as needed

## 2020-05-12 DIAGNOSIS — Z992 Dependence on renal dialysis: Secondary | ICD-10-CM | POA: Diagnosis not present

## 2020-05-12 DIAGNOSIS — N186 End stage renal disease: Secondary | ICD-10-CM | POA: Diagnosis not present

## 2020-05-12 DIAGNOSIS — N2581 Secondary hyperparathyroidism of renal origin: Secondary | ICD-10-CM | POA: Diagnosis not present

## 2020-05-13 DIAGNOSIS — Z992 Dependence on renal dialysis: Secondary | ICD-10-CM | POA: Diagnosis not present

## 2020-05-13 DIAGNOSIS — N2581 Secondary hyperparathyroidism of renal origin: Secondary | ICD-10-CM | POA: Diagnosis not present

## 2020-05-13 DIAGNOSIS — N186 End stage renal disease: Secondary | ICD-10-CM | POA: Diagnosis not present

## 2020-05-14 DIAGNOSIS — Z992 Dependence on renal dialysis: Secondary | ICD-10-CM | POA: Diagnosis not present

## 2020-05-14 DIAGNOSIS — N2581 Secondary hyperparathyroidism of renal origin: Secondary | ICD-10-CM | POA: Diagnosis not present

## 2020-05-14 DIAGNOSIS — N186 End stage renal disease: Secondary | ICD-10-CM | POA: Diagnosis not present

## 2020-05-15 DIAGNOSIS — E1122 Type 2 diabetes mellitus with diabetic chronic kidney disease: Secondary | ICD-10-CM | POA: Diagnosis not present

## 2020-05-15 DIAGNOSIS — N2581 Secondary hyperparathyroidism of renal origin: Secondary | ICD-10-CM | POA: Diagnosis not present

## 2020-05-15 DIAGNOSIS — N186 End stage renal disease: Secondary | ICD-10-CM | POA: Diagnosis not present

## 2020-05-15 DIAGNOSIS — Z992 Dependence on renal dialysis: Secondary | ICD-10-CM | POA: Diagnosis not present

## 2020-05-16 DIAGNOSIS — N2581 Secondary hyperparathyroidism of renal origin: Secondary | ICD-10-CM | POA: Diagnosis not present

## 2020-05-16 DIAGNOSIS — N186 End stage renal disease: Secondary | ICD-10-CM | POA: Diagnosis not present

## 2020-05-16 DIAGNOSIS — Z992 Dependence on renal dialysis: Secondary | ICD-10-CM | POA: Diagnosis not present

## 2020-05-17 DIAGNOSIS — N186 End stage renal disease: Secondary | ICD-10-CM | POA: Diagnosis not present

## 2020-05-17 DIAGNOSIS — N2581 Secondary hyperparathyroidism of renal origin: Secondary | ICD-10-CM | POA: Diagnosis not present

## 2020-05-17 DIAGNOSIS — Z992 Dependence on renal dialysis: Secondary | ICD-10-CM | POA: Diagnosis not present

## 2020-05-18 ENCOUNTER — Encounter: Payer: Self-pay | Admitting: Family Medicine

## 2020-05-18 DIAGNOSIS — Z992 Dependence on renal dialysis: Secondary | ICD-10-CM | POA: Diagnosis not present

## 2020-05-18 DIAGNOSIS — N2581 Secondary hyperparathyroidism of renal origin: Secondary | ICD-10-CM | POA: Diagnosis not present

## 2020-05-18 DIAGNOSIS — N186 End stage renal disease: Secondary | ICD-10-CM | POA: Diagnosis not present

## 2020-05-19 ENCOUNTER — Encounter: Payer: Self-pay | Admitting: Internal Medicine

## 2020-05-19 DIAGNOSIS — N186 End stage renal disease: Secondary | ICD-10-CM | POA: Diagnosis not present

## 2020-05-19 DIAGNOSIS — Z992 Dependence on renal dialysis: Secondary | ICD-10-CM | POA: Diagnosis not present

## 2020-05-19 DIAGNOSIS — N2581 Secondary hyperparathyroidism of renal origin: Secondary | ICD-10-CM | POA: Diagnosis not present

## 2020-05-20 DIAGNOSIS — Z992 Dependence on renal dialysis: Secondary | ICD-10-CM | POA: Diagnosis not present

## 2020-05-20 DIAGNOSIS — N2581 Secondary hyperparathyroidism of renal origin: Secondary | ICD-10-CM | POA: Diagnosis not present

## 2020-05-20 DIAGNOSIS — N186 End stage renal disease: Secondary | ICD-10-CM | POA: Diagnosis not present

## 2020-05-21 ENCOUNTER — Ambulatory Visit: Payer: Medicare Other | Admitting: Family Medicine

## 2020-05-21 DIAGNOSIS — N2581 Secondary hyperparathyroidism of renal origin: Secondary | ICD-10-CM | POA: Diagnosis not present

## 2020-05-21 DIAGNOSIS — Z992 Dependence on renal dialysis: Secondary | ICD-10-CM | POA: Diagnosis not present

## 2020-05-21 DIAGNOSIS — N186 End stage renal disease: Secondary | ICD-10-CM | POA: Diagnosis not present

## 2020-05-22 DIAGNOSIS — N186 End stage renal disease: Secondary | ICD-10-CM | POA: Diagnosis not present

## 2020-05-22 DIAGNOSIS — N2581 Secondary hyperparathyroidism of renal origin: Secondary | ICD-10-CM | POA: Diagnosis not present

## 2020-05-22 DIAGNOSIS — Z992 Dependence on renal dialysis: Secondary | ICD-10-CM | POA: Diagnosis not present

## 2020-05-23 DIAGNOSIS — N2581 Secondary hyperparathyroidism of renal origin: Secondary | ICD-10-CM | POA: Diagnosis not present

## 2020-05-23 DIAGNOSIS — N186 End stage renal disease: Secondary | ICD-10-CM | POA: Diagnosis not present

## 2020-05-23 DIAGNOSIS — Z992 Dependence on renal dialysis: Secondary | ICD-10-CM | POA: Diagnosis not present

## 2020-05-24 DIAGNOSIS — Z992 Dependence on renal dialysis: Secondary | ICD-10-CM | POA: Diagnosis not present

## 2020-05-24 DIAGNOSIS — N2581 Secondary hyperparathyroidism of renal origin: Secondary | ICD-10-CM | POA: Diagnosis not present

## 2020-05-24 DIAGNOSIS — N186 End stage renal disease: Secondary | ICD-10-CM | POA: Diagnosis not present

## 2020-05-25 DIAGNOSIS — N186 End stage renal disease: Secondary | ICD-10-CM | POA: Diagnosis not present

## 2020-05-25 DIAGNOSIS — N2581 Secondary hyperparathyroidism of renal origin: Secondary | ICD-10-CM | POA: Diagnosis not present

## 2020-05-25 DIAGNOSIS — Z992 Dependence on renal dialysis: Secondary | ICD-10-CM | POA: Diagnosis not present

## 2020-05-26 DIAGNOSIS — N186 End stage renal disease: Secondary | ICD-10-CM | POA: Diagnosis not present

## 2020-05-26 DIAGNOSIS — N2581 Secondary hyperparathyroidism of renal origin: Secondary | ICD-10-CM | POA: Diagnosis not present

## 2020-05-26 DIAGNOSIS — Z992 Dependence on renal dialysis: Secondary | ICD-10-CM | POA: Diagnosis not present

## 2020-05-27 DIAGNOSIS — N2581 Secondary hyperparathyroidism of renal origin: Secondary | ICD-10-CM | POA: Diagnosis not present

## 2020-05-27 DIAGNOSIS — N186 End stage renal disease: Secondary | ICD-10-CM | POA: Diagnosis not present

## 2020-05-27 DIAGNOSIS — Z992 Dependence on renal dialysis: Secondary | ICD-10-CM | POA: Diagnosis not present

## 2020-05-28 DIAGNOSIS — N186 End stage renal disease: Secondary | ICD-10-CM | POA: Diagnosis not present

## 2020-05-28 DIAGNOSIS — N2581 Secondary hyperparathyroidism of renal origin: Secondary | ICD-10-CM | POA: Diagnosis not present

## 2020-05-28 DIAGNOSIS — Z992 Dependence on renal dialysis: Secondary | ICD-10-CM | POA: Diagnosis not present

## 2020-05-29 DIAGNOSIS — N186 End stage renal disease: Secondary | ICD-10-CM | POA: Diagnosis not present

## 2020-05-29 DIAGNOSIS — Z992 Dependence on renal dialysis: Secondary | ICD-10-CM | POA: Diagnosis not present

## 2020-05-29 DIAGNOSIS — N2581 Secondary hyperparathyroidism of renal origin: Secondary | ICD-10-CM | POA: Diagnosis not present

## 2020-05-30 DIAGNOSIS — N2581 Secondary hyperparathyroidism of renal origin: Secondary | ICD-10-CM | POA: Diagnosis not present

## 2020-05-30 DIAGNOSIS — N186 End stage renal disease: Secondary | ICD-10-CM | POA: Diagnosis not present

## 2020-05-30 DIAGNOSIS — Z992 Dependence on renal dialysis: Secondary | ICD-10-CM | POA: Diagnosis not present

## 2020-05-31 DIAGNOSIS — N186 End stage renal disease: Secondary | ICD-10-CM | POA: Diagnosis not present

## 2020-05-31 DIAGNOSIS — N2581 Secondary hyperparathyroidism of renal origin: Secondary | ICD-10-CM | POA: Diagnosis not present

## 2020-05-31 DIAGNOSIS — Z992 Dependence on renal dialysis: Secondary | ICD-10-CM | POA: Diagnosis not present

## 2020-06-01 DIAGNOSIS — N186 End stage renal disease: Secondary | ICD-10-CM | POA: Diagnosis not present

## 2020-06-01 DIAGNOSIS — Z992 Dependence on renal dialysis: Secondary | ICD-10-CM | POA: Diagnosis not present

## 2020-06-01 DIAGNOSIS — N2581 Secondary hyperparathyroidism of renal origin: Secondary | ICD-10-CM | POA: Diagnosis not present

## 2020-06-02 DIAGNOSIS — N186 End stage renal disease: Secondary | ICD-10-CM | POA: Diagnosis not present

## 2020-06-02 DIAGNOSIS — N2581 Secondary hyperparathyroidism of renal origin: Secondary | ICD-10-CM | POA: Diagnosis not present

## 2020-06-02 DIAGNOSIS — Z992 Dependence on renal dialysis: Secondary | ICD-10-CM | POA: Diagnosis not present

## 2020-06-03 DIAGNOSIS — Z992 Dependence on renal dialysis: Secondary | ICD-10-CM | POA: Diagnosis not present

## 2020-06-03 DIAGNOSIS — N186 End stage renal disease: Secondary | ICD-10-CM | POA: Diagnosis not present

## 2020-06-03 DIAGNOSIS — N2581 Secondary hyperparathyroidism of renal origin: Secondary | ICD-10-CM | POA: Diagnosis not present

## 2020-06-04 DIAGNOSIS — N2581 Secondary hyperparathyroidism of renal origin: Secondary | ICD-10-CM | POA: Diagnosis not present

## 2020-06-04 DIAGNOSIS — N186 End stage renal disease: Secondary | ICD-10-CM | POA: Diagnosis not present

## 2020-06-04 DIAGNOSIS — Z992 Dependence on renal dialysis: Secondary | ICD-10-CM | POA: Diagnosis not present

## 2020-06-05 DIAGNOSIS — N186 End stage renal disease: Secondary | ICD-10-CM | POA: Diagnosis not present

## 2020-06-05 DIAGNOSIS — Z992 Dependence on renal dialysis: Secondary | ICD-10-CM | POA: Diagnosis not present

## 2020-06-05 DIAGNOSIS — N2581 Secondary hyperparathyroidism of renal origin: Secondary | ICD-10-CM | POA: Diagnosis not present

## 2020-06-06 DIAGNOSIS — N186 End stage renal disease: Secondary | ICD-10-CM | POA: Diagnosis not present

## 2020-06-06 DIAGNOSIS — N2581 Secondary hyperparathyroidism of renal origin: Secondary | ICD-10-CM | POA: Diagnosis not present

## 2020-06-06 DIAGNOSIS — Z992 Dependence on renal dialysis: Secondary | ICD-10-CM | POA: Diagnosis not present

## 2020-06-07 DIAGNOSIS — N186 End stage renal disease: Secondary | ICD-10-CM | POA: Diagnosis not present

## 2020-06-07 DIAGNOSIS — N2581 Secondary hyperparathyroidism of renal origin: Secondary | ICD-10-CM | POA: Diagnosis not present

## 2020-06-07 DIAGNOSIS — Z992 Dependence on renal dialysis: Secondary | ICD-10-CM | POA: Diagnosis not present

## 2020-06-08 DIAGNOSIS — N2581 Secondary hyperparathyroidism of renal origin: Secondary | ICD-10-CM | POA: Diagnosis not present

## 2020-06-08 DIAGNOSIS — N186 End stage renal disease: Secondary | ICD-10-CM | POA: Diagnosis not present

## 2020-06-08 DIAGNOSIS — Z992 Dependence on renal dialysis: Secondary | ICD-10-CM | POA: Diagnosis not present

## 2020-06-09 DIAGNOSIS — N186 End stage renal disease: Secondary | ICD-10-CM | POA: Diagnosis not present

## 2020-06-09 DIAGNOSIS — N2581 Secondary hyperparathyroidism of renal origin: Secondary | ICD-10-CM | POA: Diagnosis not present

## 2020-06-09 DIAGNOSIS — Z992 Dependence on renal dialysis: Secondary | ICD-10-CM | POA: Diagnosis not present

## 2020-06-10 DIAGNOSIS — N2581 Secondary hyperparathyroidism of renal origin: Secondary | ICD-10-CM | POA: Diagnosis not present

## 2020-06-10 DIAGNOSIS — Z992 Dependence on renal dialysis: Secondary | ICD-10-CM | POA: Diagnosis not present

## 2020-06-10 DIAGNOSIS — N186 End stage renal disease: Secondary | ICD-10-CM | POA: Diagnosis not present

## 2020-06-11 DIAGNOSIS — Z992 Dependence on renal dialysis: Secondary | ICD-10-CM | POA: Diagnosis not present

## 2020-06-11 DIAGNOSIS — N2581 Secondary hyperparathyroidism of renal origin: Secondary | ICD-10-CM | POA: Diagnosis not present

## 2020-06-11 DIAGNOSIS — N186 End stage renal disease: Secondary | ICD-10-CM | POA: Diagnosis not present

## 2020-06-12 DIAGNOSIS — N2581 Secondary hyperparathyroidism of renal origin: Secondary | ICD-10-CM | POA: Diagnosis not present

## 2020-06-12 DIAGNOSIS — Z992 Dependence on renal dialysis: Secondary | ICD-10-CM | POA: Diagnosis not present

## 2020-06-12 DIAGNOSIS — N186 End stage renal disease: Secondary | ICD-10-CM | POA: Diagnosis not present

## 2020-06-13 DIAGNOSIS — N2581 Secondary hyperparathyroidism of renal origin: Secondary | ICD-10-CM | POA: Diagnosis not present

## 2020-06-13 DIAGNOSIS — N186 End stage renal disease: Secondary | ICD-10-CM | POA: Diagnosis not present

## 2020-06-13 DIAGNOSIS — Z992 Dependence on renal dialysis: Secondary | ICD-10-CM | POA: Diagnosis not present

## 2020-06-14 DIAGNOSIS — N2581 Secondary hyperparathyroidism of renal origin: Secondary | ICD-10-CM | POA: Diagnosis not present

## 2020-06-14 DIAGNOSIS — Z992 Dependence on renal dialysis: Secondary | ICD-10-CM | POA: Diagnosis not present

## 2020-06-14 DIAGNOSIS — N186 End stage renal disease: Secondary | ICD-10-CM | POA: Diagnosis not present

## 2020-06-15 DIAGNOSIS — N186 End stage renal disease: Secondary | ICD-10-CM | POA: Diagnosis not present

## 2020-06-15 DIAGNOSIS — N2581 Secondary hyperparathyroidism of renal origin: Secondary | ICD-10-CM | POA: Diagnosis not present

## 2020-06-15 DIAGNOSIS — Z992 Dependence on renal dialysis: Secondary | ICD-10-CM | POA: Diagnosis not present

## 2020-06-15 DIAGNOSIS — E1122 Type 2 diabetes mellitus with diabetic chronic kidney disease: Secondary | ICD-10-CM | POA: Diagnosis not present

## 2020-06-16 DIAGNOSIS — N186 End stage renal disease: Secondary | ICD-10-CM | POA: Diagnosis not present

## 2020-06-16 DIAGNOSIS — N2581 Secondary hyperparathyroidism of renal origin: Secondary | ICD-10-CM | POA: Diagnosis not present

## 2020-06-16 DIAGNOSIS — Z992 Dependence on renal dialysis: Secondary | ICD-10-CM | POA: Diagnosis not present

## 2020-06-17 DIAGNOSIS — N186 End stage renal disease: Secondary | ICD-10-CM | POA: Diagnosis not present

## 2020-06-17 DIAGNOSIS — Z992 Dependence on renal dialysis: Secondary | ICD-10-CM | POA: Diagnosis not present

## 2020-06-17 DIAGNOSIS — N2581 Secondary hyperparathyroidism of renal origin: Secondary | ICD-10-CM | POA: Diagnosis not present

## 2020-06-18 DIAGNOSIS — N186 End stage renal disease: Secondary | ICD-10-CM | POA: Diagnosis not present

## 2020-06-18 DIAGNOSIS — N2581 Secondary hyperparathyroidism of renal origin: Secondary | ICD-10-CM | POA: Diagnosis not present

## 2020-06-18 DIAGNOSIS — N5201 Erectile dysfunction due to arterial insufficiency: Secondary | ICD-10-CM | POA: Diagnosis not present

## 2020-06-18 DIAGNOSIS — Z992 Dependence on renal dialysis: Secondary | ICD-10-CM | POA: Diagnosis not present

## 2020-06-19 DIAGNOSIS — N2581 Secondary hyperparathyroidism of renal origin: Secondary | ICD-10-CM | POA: Diagnosis not present

## 2020-06-19 DIAGNOSIS — Z992 Dependence on renal dialysis: Secondary | ICD-10-CM | POA: Diagnosis not present

## 2020-06-19 DIAGNOSIS — N186 End stage renal disease: Secondary | ICD-10-CM | POA: Diagnosis not present

## 2020-06-20 DIAGNOSIS — Z992 Dependence on renal dialysis: Secondary | ICD-10-CM | POA: Diagnosis not present

## 2020-06-20 DIAGNOSIS — N2581 Secondary hyperparathyroidism of renal origin: Secondary | ICD-10-CM | POA: Diagnosis not present

## 2020-06-20 DIAGNOSIS — N186 End stage renal disease: Secondary | ICD-10-CM | POA: Diagnosis not present

## 2020-06-21 DIAGNOSIS — N2581 Secondary hyperparathyroidism of renal origin: Secondary | ICD-10-CM | POA: Diagnosis not present

## 2020-06-21 DIAGNOSIS — N186 End stage renal disease: Secondary | ICD-10-CM | POA: Diagnosis not present

## 2020-06-21 DIAGNOSIS — Z992 Dependence on renal dialysis: Secondary | ICD-10-CM | POA: Diagnosis not present

## 2020-06-22 DIAGNOSIS — N186 End stage renal disease: Secondary | ICD-10-CM | POA: Diagnosis not present

## 2020-06-22 DIAGNOSIS — N2581 Secondary hyperparathyroidism of renal origin: Secondary | ICD-10-CM | POA: Diagnosis not present

## 2020-06-22 DIAGNOSIS — Z992 Dependence on renal dialysis: Secondary | ICD-10-CM | POA: Diagnosis not present

## 2020-06-23 DIAGNOSIS — N2581 Secondary hyperparathyroidism of renal origin: Secondary | ICD-10-CM | POA: Diagnosis not present

## 2020-06-23 DIAGNOSIS — Z992 Dependence on renal dialysis: Secondary | ICD-10-CM | POA: Diagnosis not present

## 2020-06-23 DIAGNOSIS — N186 End stage renal disease: Secondary | ICD-10-CM | POA: Diagnosis not present

## 2020-06-24 DIAGNOSIS — N2581 Secondary hyperparathyroidism of renal origin: Secondary | ICD-10-CM | POA: Diagnosis not present

## 2020-06-24 DIAGNOSIS — Z992 Dependence on renal dialysis: Secondary | ICD-10-CM | POA: Diagnosis not present

## 2020-06-24 DIAGNOSIS — N186 End stage renal disease: Secondary | ICD-10-CM | POA: Diagnosis not present

## 2020-06-25 DIAGNOSIS — N2581 Secondary hyperparathyroidism of renal origin: Secondary | ICD-10-CM | POA: Diagnosis not present

## 2020-06-25 DIAGNOSIS — Z992 Dependence on renal dialysis: Secondary | ICD-10-CM | POA: Diagnosis not present

## 2020-06-25 DIAGNOSIS — N186 End stage renal disease: Secondary | ICD-10-CM | POA: Diagnosis not present

## 2020-06-26 DIAGNOSIS — N2581 Secondary hyperparathyroidism of renal origin: Secondary | ICD-10-CM | POA: Diagnosis not present

## 2020-06-26 DIAGNOSIS — N186 End stage renal disease: Secondary | ICD-10-CM | POA: Diagnosis not present

## 2020-06-26 DIAGNOSIS — Z992 Dependence on renal dialysis: Secondary | ICD-10-CM | POA: Diagnosis not present

## 2020-06-27 DIAGNOSIS — Z992 Dependence on renal dialysis: Secondary | ICD-10-CM | POA: Diagnosis not present

## 2020-06-27 DIAGNOSIS — N2581 Secondary hyperparathyroidism of renal origin: Secondary | ICD-10-CM | POA: Diagnosis not present

## 2020-06-27 DIAGNOSIS — N186 End stage renal disease: Secondary | ICD-10-CM | POA: Diagnosis not present

## 2020-06-28 DIAGNOSIS — N186 End stage renal disease: Secondary | ICD-10-CM | POA: Diagnosis not present

## 2020-06-28 DIAGNOSIS — Z992 Dependence on renal dialysis: Secondary | ICD-10-CM | POA: Diagnosis not present

## 2020-06-28 DIAGNOSIS — N2581 Secondary hyperparathyroidism of renal origin: Secondary | ICD-10-CM | POA: Diagnosis not present

## 2020-06-29 DIAGNOSIS — N2581 Secondary hyperparathyroidism of renal origin: Secondary | ICD-10-CM | POA: Diagnosis not present

## 2020-06-29 DIAGNOSIS — Z992 Dependence on renal dialysis: Secondary | ICD-10-CM | POA: Diagnosis not present

## 2020-06-29 DIAGNOSIS — N186 End stage renal disease: Secondary | ICD-10-CM | POA: Diagnosis not present

## 2020-06-30 DIAGNOSIS — Z992 Dependence on renal dialysis: Secondary | ICD-10-CM | POA: Diagnosis not present

## 2020-06-30 DIAGNOSIS — N2581 Secondary hyperparathyroidism of renal origin: Secondary | ICD-10-CM | POA: Diagnosis not present

## 2020-06-30 DIAGNOSIS — N186 End stage renal disease: Secondary | ICD-10-CM | POA: Diagnosis not present

## 2020-07-01 DIAGNOSIS — N186 End stage renal disease: Secondary | ICD-10-CM | POA: Diagnosis not present

## 2020-07-01 DIAGNOSIS — N2581 Secondary hyperparathyroidism of renal origin: Secondary | ICD-10-CM | POA: Diagnosis not present

## 2020-07-01 DIAGNOSIS — Z992 Dependence on renal dialysis: Secondary | ICD-10-CM | POA: Diagnosis not present

## 2020-07-02 ENCOUNTER — Encounter: Payer: Self-pay | Admitting: Cardiology

## 2020-07-02 ENCOUNTER — Other Ambulatory Visit: Payer: Self-pay

## 2020-07-02 ENCOUNTER — Ambulatory Visit (INDEPENDENT_AMBULATORY_CARE_PROVIDER_SITE_OTHER): Payer: Federal, State, Local not specified - PPO | Admitting: Cardiology

## 2020-07-02 VITALS — BP 160/100 | HR 84 | Ht 67.0 in | Wt 300.6 lb

## 2020-07-02 DIAGNOSIS — B958 Unspecified staphylococcus as the cause of diseases classified elsewhere: Secondary | ICD-10-CM

## 2020-07-02 DIAGNOSIS — G4733 Obstructive sleep apnea (adult) (pediatric): Secondary | ICD-10-CM | POA: Diagnosis not present

## 2020-07-02 DIAGNOSIS — R079 Chest pain, unspecified: Secondary | ICD-10-CM | POA: Diagnosis not present

## 2020-07-02 DIAGNOSIS — I5032 Chronic diastolic (congestive) heart failure: Secondary | ICD-10-CM

## 2020-07-02 DIAGNOSIS — Z9989 Dependence on other enabling machines and devices: Secondary | ICD-10-CM

## 2020-07-02 DIAGNOSIS — G4709 Other insomnia: Secondary | ICD-10-CM

## 2020-07-02 DIAGNOSIS — I10 Essential (primary) hypertension: Secondary | ICD-10-CM | POA: Diagnosis not present

## 2020-07-02 DIAGNOSIS — Z992 Dependence on renal dialysis: Secondary | ICD-10-CM | POA: Diagnosis not present

## 2020-07-02 DIAGNOSIS — N186 End stage renal disease: Secondary | ICD-10-CM | POA: Diagnosis not present

## 2020-07-02 DIAGNOSIS — N2581 Secondary hyperparathyroidism of renal origin: Secondary | ICD-10-CM | POA: Diagnosis not present

## 2020-07-02 DIAGNOSIS — I330 Acute and subacute infective endocarditis: Secondary | ICD-10-CM

## 2020-07-02 MED ORDER — AMLODIPINE BESYLATE 5 MG PO TABS: 7.5000 mg | ORAL_TABLET | Freq: Every day | ORAL | 3 refills | Status: DC

## 2020-07-02 MED ORDER — ZOLPIDEM TARTRATE ER 12.5 MG PO TBCR: 12.5000 mg | EXTENDED_RELEASE_TABLET | Freq: Every evening | ORAL | 5 refills | Status: DC | PRN

## 2020-07-02 NOTE — Addendum Note (Signed)
Addended by: Antonieta Iba on: 07/02/2020 01:20 PM   Modules accepted: Orders

## 2020-07-02 NOTE — Patient Instructions (Addendum)
Medication Instructions:  Your physician has recommended you make the following change in your medication:  1) INCREASE amlodipine to 7.5 mg daily  2) CHANGE ambien to extended release 12.5 mg to take at night as needed  *If you need a refill on your cardiac medications before your next appointment, please call your pharmacy*  Testing/Procedures: Your physician has requested that you have an echocardiogram. Echocardiography is a painless test that uses sound waves to create images of your heart. It provides your doctor with information about the size and shape of your heart and how well your heart's chambers and valves are working. This procedure takes approximately one hour. There are no restrictions for this procedure.  Your physician has requested that you have a lexiscan myoview. For further information please visit [email or web address]. Please follow instruction sheet, as given.  Follow-Up: At [facility], you and your health needs are our priority.  As part of our continuing mission to provide you with exceptional heart care, we have created designated Provider Care Teams.  These Care Teams include your primary Cardiologist (physician) and Advanced Practice Providers (APPs -  Physician Assistants and Nurse Practitioners) who all work together to provide you with the care you need, when you need it.  Your next appointment:   1 year(s)  The format for your next appointment:   In Person  Provider:   [name], MD   Other Instructions You have been referred to see our PharmD in the Hypertension Clinic in two weeks.   You have been referred to the [facility].

## 2020-07-02 NOTE — Progress Notes (Addendum)
Cardiology Consult Note:    Date:  07/02/2020   ID:  Adrian Frye, DOB 1971/01/04, MRN [id number]  + PCP:  Girtha Rm, NP-C  Cardiologist:  [name], MD   Referring MD: Girtha Rm, NP-C   Chief Complaint  Patient presents with  . Sleep Apnea  . Hypertension  . New Patient (Initial Visit)    OSA, morbid obesity, HTN    History of Present Illness:    Adrian Frye is a 50 y.o. male with a hx of DCM, HTN, OSA on CPAP and obesity. I have not seen him in over 4 years.  He has established cardiac care with Dr. Terri Skains and is referred here to reestablish sleep medicine care.   He is doing well with his CPAP device and thinks that he has gotten used to it.  He tolerates the mask and feels the pressure is adequate.  Since going on CPAP he feels rested in the am and has no significant daytime sleepiness.  He denies any significant mouth or nasal dryness or nasal congestion.  He does not think that he snores.     Past Medical History:  Diagnosis Date  . Allergic rhinitis   . Allergy   . Anemia   . Asthma    as a child  . Bacteremia   . Benign essential hypertension   . Dilated idiopathic cardiomyopathy ([place])    now resoved with EF 55% by echo 2011  . Edema   . Encounter for blood transfusion   . Endocarditis   . ESRD (end stage renal disease) ([place])   . ESRD on peritoneal dialysis ([place])   . Heart disease   . Hyperlipidemia   . Hyperparathyroidism, secondary ([place])   . Hypertension   . Morbid obesity ([place])   . Sleep apnea    c-pap  . UGI bleed 2011   ASA    Past Surgical History:  Procedure Laterality Date  . ACHILLES TENDON REPAIR     ruptured; right  . AV FISTULA PLACEMENT Left 11/03/2017   Procedure: ARTERIOVENOUS (AV) FISTULA CREATION  left upper arm;  Surgeon: Rosetta Posner, MD;  Location: [facility];  Service: Vascular;  Laterality: Left;  . DIALYSIS FISTULA CREATION    . INSERTION OF DIALYSIS CATHETER N/A 11/03/2017   Procedure: INSERTION  OF DIALYSIS CATHETER - RIGHT INTERNAL JUGULAR PLACEMENT;  Surgeon: Rosetta Posner, MD;  Location: [facility];  Service: Vascular;  Laterality: N/A;  . IR FLUORO GUIDE CV LINE LEFT  01/24/2020  . IR REMOVAL TUN CV CATH W/O FL  01/21/2020  . IR REMOVAL TUN CV CATH W/O FL  03/06/2020  . IR US GUIDE VASC ACCESS LEFT  01/24/2020  . KIDNEY FAILURE    . LAPAROSCOPIC GASTROTOMY W/ REPAIR OF ULCER    . PLEURAL SCARIFICATION     left, football trauma-chest tube  . STOMACH SURGERY    . TEE WITHOUT CARDIOVERSION N/A 01/22/2020   Procedure: TRANSESOPHAGEAL ECHOCARDIOGRAM (TEE);  Surgeon: Pixie Casino, MD;  Location: [facility];  Service: Cardiovascular;  Laterality: N/A;  . TONSILLECTOMY    . TONSILLECTOMY    . UPPER GASTROINTESTINAL ENDOSCOPY      Current Medications: Current Meds  Medication Sig  . amLODipine (NORVASC) 2.5 MG tablet Take 5 mg by mouth daily.  . B Complex-C-Folic Acid (DIALYVITE 798 PO) Take 1 tablet by mouth daily.  . calcitRIOL (ROCALTROL) 0.25 MCG capsule Take 0.5 mcg by mouth daily.   . calcitRIOL (  ROCALTROL) 0.5 MCG capsule Take 0.5 mcg by mouth daily.  . ferric citrate (AURYXIA) 1 GM 210 MG(Fe) tablet Take 840 mg by mouth 3 (three) times daily with meals.   Marland Kitchen gentamicin cream (GARAMYCIN) 0.1 % Apply 1 application topically as directed. Port site  . Methoxy PEG-Epoetin Beta (MIRCERA IJ) Inject into the skin.  . sildenafil (REVATIO) 20 MG tablet Take 20 mg by mouth 3 (three) times daily.  Marland Kitchen triamcinolone cream (KENALOG) 0.1 % Apply 1 application topically daily as needed (rash).   . VELPHORO 500 MG chewable tablet Chew 500 mg by mouth 3 (three) times daily.  Marland Kitchen zolpidem (AMBIEN) 10 MG tablet Take 10 mg by mouth at bedtime as needed.     Allergies:   Lisinopril and Losartan   Social History   Socioeconomic History  . Marital status: Married    Spouse name: Not on file  . Number of children: 2  . Years of education: Not on file  . Highest education level: Not on file   Occupational History  . Occupation: Clinical cytogeneticist  Tobacco Use  . Smoking status: Former Smoker    Years: 6.00    Types: Cigarettes    Quit date: 05/16/2009    Years since quitting: 11.1  . Smokeless tobacco: Never Used  . Tobacco comment: smoked 1 pack per week   Vaping Use  . Vaping Use: Never used  Substance and Sexual Activity  . Alcohol use: Not Currently    Alcohol/week: 2.0 standard drinks    Types: 2 Cans of beer per week    Comment: occasionally  . Drug use: No  . Sexual activity: Yes  Other Topics Concern  . Not on file  Social History Narrative  . Not on file   Social Determinants of Health   Financial Resource Strain: Not on file  Food Insecurity: Not on file  Transportation Needs: Not on file  Physical Activity: Not on file  Stress: Not on file  Social Connections: Not on file     Family History: The patient's family history includes Diabetes in his mother; Heart disease in his father; Heart failure in his mother; Hypertension in his brother, brother, and mother. There is no history of Colon cancer, Esophageal cancer, Rectal cancer, or Stomach cancer.  ROS:   Please see the history of present illness.     All other systems reviewed and are negative.  EKGs/Labs/Other Studies Reviewed:    The following studies were reviewed today: none  EKG:  EKG is not ordered today.    Recent Labs: 01/20/2020: Magnesium 2.0 01/21/2020: ALT 42 01/24/2020: BUN 79; Creatinine, Ser 16.30; Hemoglobin 9.6; Platelets 124; Potassium 3.6; Sodium 133   Recent Lipid Panel No results found for: CHOL, TRIG, HDL, CHOLHDL, VLDL, LDLCALC, LDLDIRECT  Physical Exam:    VS:  BP (!) 160/100   Pulse 84   Ht 5\' 7"  (1.702 m)   Wt (!) 300 lb 9.6 oz (136.4 kg)   SpO2 97%   BMI 47.08 kg/m     Wt Readings from Last 3 Encounters:  07/02/20 (!) 300 lb 9.6 oz (136.4 kg)  05/11/20 (!) 306 lb 9.6 oz (139.1 kg)  05/05/20 (!) 307 lb 3.2 oz (139.3 kg)     GEN: Well nourished, well  developed in no acute distress HEENT: Normal NECK: No JVD; No carotid bruits LYMPHATICS: No lymphadenopathy CARDIAC:RRR, no rubs, gallops.  2/6 SM at LUSB RESPIRATORY:  Clear to auscultation without rales, wheezing or rhonchi  ABDOMEN: Soft, non-tender, non-distended MUSCULOSKELETAL:  No edema; No deformity  SKIN: Warm and dry NEUROLOGIC:  Alert and oriented x 3 PSYCHIATRIC:  Normal affect   ASSESSMENT:    1. OSA on CPAP   2. Primary hypertension   3. Morbid obesity ([place])   4. Chest pain of uncertain etiology   5. Other insomnia   6. 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 OSA -  The patient is tolerating PAP therapy well without any problems. The PAP download was reviewed today and showed an AHI of 3.4/hr on 20 cm H2O with 100% compliance in using more than 4 hours nightly.  The patient has been using and benefiting from PAP use and will continue to benefit from therapy.   2.  HTN  -BP poorly controlled this am and at home running 140/84mmHg -increase amlodipine to 7.5mg  daily -PharmD HTN clinic appt in 2 weeks  3.  Morbid Obesity -He needs to get the weight off as he is being worked up for renal transplant -refer to [facility] [place]  4.  Chest pain -this is atypical but he has ESRD which places him at high risk for CAD as well as morbid obesity, HTN and HLD -recommend Lexiscan myoview to rule out ischemia -he wants to take Viagra but I recommended that we get the stress test first -Shared Decision Making/Informed Consent The risks [chest pain, shortness of breath, cardiac arrhythmias, dizziness, blood pressure fluctuations, myocardial infarction, stroke/transient ischemic attack, nausea, vomiting, allergic reaction, radiation exposure, metallic taste sensation and life-threatening complications (estimated to be 1 in 10,000)], benefits (risk stratification,  diagnosing coronary artery disease, treatment guidance) and alternatives of a nuclear stress test were discussed in detail with Mr. Mitchum and he agrees to proceed.  5.  Insomnia  -He is taking Ambien to help with sleep maintenance insomnia -he is still waking up some at night but his OSA is well controlled -he is on short acting Ambien so will change to Ambien XL 12.5mg  nightly PRN  6.  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  Concerns regarding medicines are outlined above.  No orders of the defined types were placed in this encounter.  No orders of the defined types were placed in this encounter.   Signed, [name], MD  07/02/2020 1:06 PM    [place]

## 2020-07-03 DIAGNOSIS — N2581 Secondary hyperparathyroidism of renal origin: Secondary | ICD-10-CM | POA: Diagnosis not present

## 2020-07-03 DIAGNOSIS — N186 End stage renal disease: Secondary | ICD-10-CM | POA: Diagnosis not present

## 2020-07-03 DIAGNOSIS — Z992 Dependence on renal dialysis: Secondary | ICD-10-CM | POA: Diagnosis not present

## 2020-07-04 DIAGNOSIS — Z992 Dependence on renal dialysis: Secondary | ICD-10-CM | POA: Diagnosis not present

## 2020-07-04 DIAGNOSIS — N2581 Secondary hyperparathyroidism of renal origin: Secondary | ICD-10-CM | POA: Diagnosis not present

## 2020-07-04 DIAGNOSIS — N186 End stage renal disease: Secondary | ICD-10-CM | POA: Diagnosis not present

## 2020-07-05 DIAGNOSIS — N2581 Secondary hyperparathyroidism of renal origin: Secondary | ICD-10-CM | POA: Diagnosis not present

## 2020-07-05 DIAGNOSIS — N186 End stage renal disease: Secondary | ICD-10-CM | POA: Diagnosis not present

## 2020-07-05 DIAGNOSIS — Z992 Dependence on renal dialysis: Secondary | ICD-10-CM | POA: Diagnosis not present

## 2020-07-06 DIAGNOSIS — Z992 Dependence on renal dialysis: Secondary | ICD-10-CM | POA: Diagnosis not present

## 2020-07-06 DIAGNOSIS — N186 End stage renal disease: Secondary | ICD-10-CM | POA: Diagnosis not present

## 2020-07-06 DIAGNOSIS — N2581 Secondary hyperparathyroidism of renal origin: Secondary | ICD-10-CM | POA: Diagnosis not present

## 2020-07-07 DIAGNOSIS — Z992 Dependence on renal dialysis: Secondary | ICD-10-CM | POA: Diagnosis not present

## 2020-07-07 DIAGNOSIS — N2581 Secondary hyperparathyroidism of renal origin: Secondary | ICD-10-CM | POA: Diagnosis not present

## 2020-07-07 DIAGNOSIS — N186 End stage renal disease: Secondary | ICD-10-CM | POA: Diagnosis not present

## 2020-07-08 DIAGNOSIS — Z992 Dependence on renal dialysis: Secondary | ICD-10-CM | POA: Diagnosis not present

## 2020-07-08 DIAGNOSIS — N2581 Secondary hyperparathyroidism of renal origin: Secondary | ICD-10-CM | POA: Diagnosis not present

## 2020-07-08 DIAGNOSIS — N186 End stage renal disease: Secondary | ICD-10-CM | POA: Diagnosis not present

## 2020-07-09 ENCOUNTER — Telehealth (HOSPITAL_COMMUNITY): Payer: Self-pay | Admitting: *Deleted

## 2020-07-09 DIAGNOSIS — N2581 Secondary hyperparathyroidism of renal origin: Secondary | ICD-10-CM | POA: Diagnosis not present

## 2020-07-09 DIAGNOSIS — N186 End stage renal disease: Secondary | ICD-10-CM | POA: Diagnosis not present

## 2020-07-09 DIAGNOSIS — Z992 Dependence on renal dialysis: Secondary | ICD-10-CM | POA: Diagnosis not present

## 2020-07-09 NOTE — Telephone Encounter (Signed)
Patient given detailed instructions per Myocardial Perfusion Study Information Sheet for the test on 07/13/20. Patient notified to arrive 15 minutes early and that it is imperative to arrive on time for appointment to keep from having the test rescheduled.  If you need to cancel or reschedule your appointment, please call the office within 24 hours of your appointment. . Patient verbalized understanding. Cozetta Seif Jacqueline    

## 2020-07-09 NOTE — Addendum Note (Signed)
Addended by: Sueanne Margarita on: 07/09/2020 07:57 AM   Modules accepted: Orders

## 2020-07-10 DIAGNOSIS — Z992 Dependence on renal dialysis: Secondary | ICD-10-CM | POA: Diagnosis not present

## 2020-07-10 DIAGNOSIS — N186 End stage renal disease: Secondary | ICD-10-CM | POA: Diagnosis not present

## 2020-07-10 DIAGNOSIS — N2581 Secondary hyperparathyroidism of renal origin: Secondary | ICD-10-CM | POA: Diagnosis not present

## 2020-07-11 DIAGNOSIS — N186 End stage renal disease: Secondary | ICD-10-CM | POA: Diagnosis not present

## 2020-07-11 DIAGNOSIS — N2581 Secondary hyperparathyroidism of renal origin: Secondary | ICD-10-CM | POA: Diagnosis not present

## 2020-07-11 DIAGNOSIS — Z992 Dependence on renal dialysis: Secondary | ICD-10-CM | POA: Diagnosis not present

## 2020-07-12 DIAGNOSIS — N2581 Secondary hyperparathyroidism of renal origin: Secondary | ICD-10-CM | POA: Diagnosis not present

## 2020-07-12 DIAGNOSIS — N186 End stage renal disease: Secondary | ICD-10-CM | POA: Diagnosis not present

## 2020-07-12 DIAGNOSIS — Z992 Dependence on renal dialysis: Secondary | ICD-10-CM | POA: Diagnosis not present

## 2020-07-13 ENCOUNTER — Ambulatory Visit (HOSPITAL_COMMUNITY): Payer: Federal, State, Local not specified - PPO | Attending: Cardiology

## 2020-07-13 ENCOUNTER — Other Ambulatory Visit: Payer: Self-pay

## 2020-07-13 DIAGNOSIS — R079 Chest pain, unspecified: Secondary | ICD-10-CM | POA: Insufficient documentation

## 2020-07-13 DIAGNOSIS — Z992 Dependence on renal dialysis: Secondary | ICD-10-CM | POA: Diagnosis not present

## 2020-07-13 DIAGNOSIS — N186 End stage renal disease: Secondary | ICD-10-CM | POA: Diagnosis not present

## 2020-07-13 DIAGNOSIS — N2581 Secondary hyperparathyroidism of renal origin: Secondary | ICD-10-CM | POA: Diagnosis not present

## 2020-07-13 MED ORDER — TECHNETIUM TC 99M TETROFOSMIN IV KIT
29.6000 | PACK | Freq: Once | INTRAVENOUS | Status: AC | PRN
  Administered 2020-07-13: 29.6 via INTRAVENOUS
  Filled 2020-07-13: qty 30

## 2020-07-13 MED ORDER — REGADENOSON 0.4 MG/5ML IV SOLN
0.4000 mg | Freq: Once | INTRAVENOUS | Status: AC
  Administered 2020-07-13: 0.4 mg via INTRAVENOUS

## 2020-07-14 ENCOUNTER — Ambulatory Visit (HOSPITAL_COMMUNITY): Payer: Federal, State, Local not specified - PPO | Attending: Internal Medicine

## 2020-07-14 DIAGNOSIS — Z4932 Encounter for adequacy testing for peritoneal dialysis: Secondary | ICD-10-CM | POA: Diagnosis not present

## 2020-07-14 DIAGNOSIS — Z992 Dependence on renal dialysis: Secondary | ICD-10-CM | POA: Diagnosis not present

## 2020-07-14 DIAGNOSIS — N186 End stage renal disease: Secondary | ICD-10-CM | POA: Diagnosis not present

## 2020-07-14 DIAGNOSIS — E785 Hyperlipidemia, unspecified: Secondary | ICD-10-CM | POA: Diagnosis not present

## 2020-07-14 DIAGNOSIS — N2581 Secondary hyperparathyroidism of renal origin: Secondary | ICD-10-CM | POA: Diagnosis not present

## 2020-07-14 DIAGNOSIS — D631 Anemia in chronic kidney disease: Secondary | ICD-10-CM | POA: Diagnosis not present

## 2020-07-14 LAB — MYOCARDIAL PERFUSION IMAGING
LV dias vol: 191 mL (ref 62–150)
LV sys vol: 102 mL
Peak HR: 95 {beats}/min
Rest HR: 84 {beats}/min
SDS: 1
SRS: 1
SSS: 2
TID: 0.99

## 2020-07-14 MED ORDER — TECHNETIUM TC 99M TETROFOSMIN IV KIT
31.4000 | PACK | Freq: Once | INTRAVENOUS | Status: AC | PRN
  Administered 2020-07-14: 31.4 via INTRAVENOUS
  Filled 2020-07-14: qty 32

## 2020-07-15 DIAGNOSIS — E785 Hyperlipidemia, unspecified: Secondary | ICD-10-CM | POA: Diagnosis not present

## 2020-07-15 DIAGNOSIS — N186 End stage renal disease: Secondary | ICD-10-CM | POA: Diagnosis not present

## 2020-07-15 DIAGNOSIS — Z4932 Encounter for adequacy testing for peritoneal dialysis: Secondary | ICD-10-CM | POA: Diagnosis not present

## 2020-07-15 DIAGNOSIS — Z992 Dependence on renal dialysis: Secondary | ICD-10-CM | POA: Diagnosis not present

## 2020-07-15 DIAGNOSIS — D631 Anemia in chronic kidney disease: Secondary | ICD-10-CM | POA: Diagnosis not present

## 2020-07-15 DIAGNOSIS — N2581 Secondary hyperparathyroidism of renal origin: Secondary | ICD-10-CM | POA: Diagnosis not present

## 2020-07-16 DIAGNOSIS — N2581 Secondary hyperparathyroidism of renal origin: Secondary | ICD-10-CM | POA: Diagnosis not present

## 2020-07-16 DIAGNOSIS — E785 Hyperlipidemia, unspecified: Secondary | ICD-10-CM | POA: Diagnosis not present

## 2020-07-16 DIAGNOSIS — D631 Anemia in chronic kidney disease: Secondary | ICD-10-CM | POA: Diagnosis not present

## 2020-07-16 DIAGNOSIS — Z4932 Encounter for adequacy testing for peritoneal dialysis: Secondary | ICD-10-CM | POA: Diagnosis not present

## 2020-07-16 DIAGNOSIS — Z992 Dependence on renal dialysis: Secondary | ICD-10-CM | POA: Diagnosis not present

## 2020-07-16 DIAGNOSIS — N186 End stage renal disease: Secondary | ICD-10-CM | POA: Diagnosis not present

## 2020-07-17 DIAGNOSIS — D631 Anemia in chronic kidney disease: Secondary | ICD-10-CM | POA: Diagnosis not present

## 2020-07-17 DIAGNOSIS — Z4932 Encounter for adequacy testing for peritoneal dialysis: Secondary | ICD-10-CM | POA: Diagnosis not present

## 2020-07-17 DIAGNOSIS — N2581 Secondary hyperparathyroidism of renal origin: Secondary | ICD-10-CM | POA: Diagnosis not present

## 2020-07-17 DIAGNOSIS — E785 Hyperlipidemia, unspecified: Secondary | ICD-10-CM | POA: Diagnosis not present

## 2020-07-17 DIAGNOSIS — N186 End stage renal disease: Secondary | ICD-10-CM | POA: Diagnosis not present

## 2020-07-17 DIAGNOSIS — Z992 Dependence on renal dialysis: Secondary | ICD-10-CM | POA: Diagnosis not present

## 2020-07-18 DIAGNOSIS — E785 Hyperlipidemia, unspecified: Secondary | ICD-10-CM | POA: Diagnosis not present

## 2020-07-18 DIAGNOSIS — N2581 Secondary hyperparathyroidism of renal origin: Secondary | ICD-10-CM | POA: Diagnosis not present

## 2020-07-18 DIAGNOSIS — N186 End stage renal disease: Secondary | ICD-10-CM | POA: Diagnosis not present

## 2020-07-18 DIAGNOSIS — D631 Anemia in chronic kidney disease: Secondary | ICD-10-CM | POA: Diagnosis not present

## 2020-07-18 DIAGNOSIS — Z992 Dependence on renal dialysis: Secondary | ICD-10-CM | POA: Diagnosis not present

## 2020-07-18 DIAGNOSIS — Z4932 Encounter for adequacy testing for peritoneal dialysis: Secondary | ICD-10-CM | POA: Diagnosis not present

## 2020-07-19 DIAGNOSIS — N186 End stage renal disease: Secondary | ICD-10-CM | POA: Diagnosis not present

## 2020-07-19 DIAGNOSIS — E785 Hyperlipidemia, unspecified: Secondary | ICD-10-CM | POA: Diagnosis not present

## 2020-07-19 DIAGNOSIS — N2581 Secondary hyperparathyroidism of renal origin: Secondary | ICD-10-CM | POA: Diagnosis not present

## 2020-07-19 DIAGNOSIS — D631 Anemia in chronic kidney disease: Secondary | ICD-10-CM | POA: Diagnosis not present

## 2020-07-19 DIAGNOSIS — Z992 Dependence on renal dialysis: Secondary | ICD-10-CM | POA: Diagnosis not present

## 2020-07-19 DIAGNOSIS — Z4932 Encounter for adequacy testing for peritoneal dialysis: Secondary | ICD-10-CM | POA: Diagnosis not present

## 2020-07-20 DIAGNOSIS — N2581 Secondary hyperparathyroidism of renal origin: Secondary | ICD-10-CM | POA: Diagnosis not present

## 2020-07-20 DIAGNOSIS — D631 Anemia in chronic kidney disease: Secondary | ICD-10-CM | POA: Diagnosis not present

## 2020-07-20 DIAGNOSIS — E785 Hyperlipidemia, unspecified: Secondary | ICD-10-CM | POA: Diagnosis not present

## 2020-07-20 DIAGNOSIS — N186 End stage renal disease: Secondary | ICD-10-CM | POA: Diagnosis not present

## 2020-07-20 DIAGNOSIS — Z4932 Encounter for adequacy testing for peritoneal dialysis: Secondary | ICD-10-CM | POA: Diagnosis not present

## 2020-07-20 DIAGNOSIS — Z992 Dependence on renal dialysis: Secondary | ICD-10-CM | POA: Diagnosis not present

## 2020-07-21 DIAGNOSIS — E785 Hyperlipidemia, unspecified: Secondary | ICD-10-CM | POA: Diagnosis not present

## 2020-07-21 DIAGNOSIS — N186 End stage renal disease: Secondary | ICD-10-CM | POA: Diagnosis not present

## 2020-07-21 DIAGNOSIS — Z4932 Encounter for adequacy testing for peritoneal dialysis: Secondary | ICD-10-CM | POA: Diagnosis not present

## 2020-07-21 DIAGNOSIS — Z992 Dependence on renal dialysis: Secondary | ICD-10-CM | POA: Diagnosis not present

## 2020-07-21 DIAGNOSIS — D631 Anemia in chronic kidney disease: Secondary | ICD-10-CM | POA: Diagnosis not present

## 2020-07-21 DIAGNOSIS — N2581 Secondary hyperparathyroidism of renal origin: Secondary | ICD-10-CM | POA: Diagnosis not present

## 2020-07-22 DIAGNOSIS — N2581 Secondary hyperparathyroidism of renal origin: Secondary | ICD-10-CM | POA: Diagnosis not present

## 2020-07-22 DIAGNOSIS — E785 Hyperlipidemia, unspecified: Secondary | ICD-10-CM | POA: Diagnosis not present

## 2020-07-22 DIAGNOSIS — Z992 Dependence on renal dialysis: Secondary | ICD-10-CM | POA: Diagnosis not present

## 2020-07-22 DIAGNOSIS — Z4932 Encounter for adequacy testing for peritoneal dialysis: Secondary | ICD-10-CM | POA: Diagnosis not present

## 2020-07-22 DIAGNOSIS — N186 End stage renal disease: Secondary | ICD-10-CM | POA: Diagnosis not present

## 2020-07-22 DIAGNOSIS — D631 Anemia in chronic kidney disease: Secondary | ICD-10-CM | POA: Diagnosis not present

## 2020-07-23 DIAGNOSIS — N186 End stage renal disease: Secondary | ICD-10-CM | POA: Diagnosis not present

## 2020-07-23 DIAGNOSIS — E785 Hyperlipidemia, unspecified: Secondary | ICD-10-CM | POA: Diagnosis not present

## 2020-07-23 DIAGNOSIS — Z4932 Encounter for adequacy testing for peritoneal dialysis: Secondary | ICD-10-CM | POA: Diagnosis not present

## 2020-07-23 DIAGNOSIS — D631 Anemia in chronic kidney disease: Secondary | ICD-10-CM | POA: Diagnosis not present

## 2020-07-23 DIAGNOSIS — N2581 Secondary hyperparathyroidism of renal origin: Secondary | ICD-10-CM | POA: Diagnosis not present

## 2020-07-23 DIAGNOSIS — Z992 Dependence on renal dialysis: Secondary | ICD-10-CM | POA: Diagnosis not present

## 2020-07-24 DIAGNOSIS — N2581 Secondary hyperparathyroidism of renal origin: Secondary | ICD-10-CM | POA: Diagnosis not present

## 2020-07-24 DIAGNOSIS — Z992 Dependence on renal dialysis: Secondary | ICD-10-CM | POA: Diagnosis not present

## 2020-07-24 DIAGNOSIS — E785 Hyperlipidemia, unspecified: Secondary | ICD-10-CM | POA: Diagnosis not present

## 2020-07-24 DIAGNOSIS — D631 Anemia in chronic kidney disease: Secondary | ICD-10-CM | POA: Diagnosis not present

## 2020-07-24 DIAGNOSIS — Z4932 Encounter for adequacy testing for peritoneal dialysis: Secondary | ICD-10-CM | POA: Diagnosis not present

## 2020-07-24 DIAGNOSIS — N186 End stage renal disease: Secondary | ICD-10-CM | POA: Diagnosis not present

## 2020-07-25 DIAGNOSIS — D631 Anemia in chronic kidney disease: Secondary | ICD-10-CM | POA: Diagnosis not present

## 2020-07-25 DIAGNOSIS — Z4932 Encounter for adequacy testing for peritoneal dialysis: Secondary | ICD-10-CM | POA: Diagnosis not present

## 2020-07-25 DIAGNOSIS — Z992 Dependence on renal dialysis: Secondary | ICD-10-CM | POA: Diagnosis not present

## 2020-07-25 DIAGNOSIS — E785 Hyperlipidemia, unspecified: Secondary | ICD-10-CM | POA: Diagnosis not present

## 2020-07-25 DIAGNOSIS — N2581 Secondary hyperparathyroidism of renal origin: Secondary | ICD-10-CM | POA: Diagnosis not present

## 2020-07-25 DIAGNOSIS — N186 End stage renal disease: Secondary | ICD-10-CM | POA: Diagnosis not present

## 2020-07-26 DIAGNOSIS — D631 Anemia in chronic kidney disease: Secondary | ICD-10-CM | POA: Diagnosis not present

## 2020-07-26 DIAGNOSIS — Z4932 Encounter for adequacy testing for peritoneal dialysis: Secondary | ICD-10-CM | POA: Diagnosis not present

## 2020-07-26 DIAGNOSIS — Z992 Dependence on renal dialysis: Secondary | ICD-10-CM | POA: Diagnosis not present

## 2020-07-26 DIAGNOSIS — E785 Hyperlipidemia, unspecified: Secondary | ICD-10-CM | POA: Diagnosis not present

## 2020-07-26 DIAGNOSIS — N186 End stage renal disease: Secondary | ICD-10-CM | POA: Diagnosis not present

## 2020-07-26 DIAGNOSIS — N2581 Secondary hyperparathyroidism of renal origin: Secondary | ICD-10-CM | POA: Diagnosis not present

## 2020-07-27 DIAGNOSIS — Z4932 Encounter for adequacy testing for peritoneal dialysis: Secondary | ICD-10-CM | POA: Diagnosis not present

## 2020-07-27 DIAGNOSIS — Z992 Dependence on renal dialysis: Secondary | ICD-10-CM | POA: Diagnosis not present

## 2020-07-27 DIAGNOSIS — D631 Anemia in chronic kidney disease: Secondary | ICD-10-CM | POA: Diagnosis not present

## 2020-07-27 DIAGNOSIS — N186 End stage renal disease: Secondary | ICD-10-CM | POA: Diagnosis not present

## 2020-07-27 DIAGNOSIS — E785 Hyperlipidemia, unspecified: Secondary | ICD-10-CM | POA: Diagnosis not present

## 2020-07-27 DIAGNOSIS — N2581 Secondary hyperparathyroidism of renal origin: Secondary | ICD-10-CM | POA: Diagnosis not present

## 2020-07-28 DIAGNOSIS — Z4932 Encounter for adequacy testing for peritoneal dialysis: Secondary | ICD-10-CM | POA: Diagnosis not present

## 2020-07-28 DIAGNOSIS — E785 Hyperlipidemia, unspecified: Secondary | ICD-10-CM | POA: Diagnosis not present

## 2020-07-28 DIAGNOSIS — N186 End stage renal disease: Secondary | ICD-10-CM | POA: Diagnosis not present

## 2020-07-28 DIAGNOSIS — N2581 Secondary hyperparathyroidism of renal origin: Secondary | ICD-10-CM | POA: Diagnosis not present

## 2020-07-28 DIAGNOSIS — Z992 Dependence on renal dialysis: Secondary | ICD-10-CM | POA: Diagnosis not present

## 2020-07-28 DIAGNOSIS — D631 Anemia in chronic kidney disease: Secondary | ICD-10-CM | POA: Diagnosis not present

## 2020-07-29 DIAGNOSIS — E785 Hyperlipidemia, unspecified: Secondary | ICD-10-CM | POA: Diagnosis not present

## 2020-07-29 DIAGNOSIS — Z992 Dependence on renal dialysis: Secondary | ICD-10-CM | POA: Diagnosis not present

## 2020-07-29 DIAGNOSIS — N186 End stage renal disease: Secondary | ICD-10-CM | POA: Diagnosis not present

## 2020-07-29 DIAGNOSIS — D631 Anemia in chronic kidney disease: Secondary | ICD-10-CM | POA: Diagnosis not present

## 2020-07-29 DIAGNOSIS — Z4932 Encounter for adequacy testing for peritoneal dialysis: Secondary | ICD-10-CM | POA: Diagnosis not present

## 2020-07-29 DIAGNOSIS — N2581 Secondary hyperparathyroidism of renal origin: Secondary | ICD-10-CM | POA: Diagnosis not present

## 2020-07-30 DIAGNOSIS — D631 Anemia in chronic kidney disease: Secondary | ICD-10-CM | POA: Diagnosis not present

## 2020-07-30 DIAGNOSIS — E785 Hyperlipidemia, unspecified: Secondary | ICD-10-CM | POA: Diagnosis not present

## 2020-07-30 DIAGNOSIS — Z4932 Encounter for adequacy testing for peritoneal dialysis: Secondary | ICD-10-CM | POA: Diagnosis not present

## 2020-07-30 DIAGNOSIS — Z992 Dependence on renal dialysis: Secondary | ICD-10-CM | POA: Diagnosis not present

## 2020-07-30 DIAGNOSIS — N186 End stage renal disease: Secondary | ICD-10-CM | POA: Diagnosis not present

## 2020-07-30 DIAGNOSIS — N2581 Secondary hyperparathyroidism of renal origin: Secondary | ICD-10-CM | POA: Diagnosis not present

## 2020-07-31 DIAGNOSIS — Z4932 Encounter for adequacy testing for peritoneal dialysis: Secondary | ICD-10-CM | POA: Diagnosis not present

## 2020-07-31 DIAGNOSIS — N186 End stage renal disease: Secondary | ICD-10-CM | POA: Diagnosis not present

## 2020-07-31 DIAGNOSIS — N2581 Secondary hyperparathyroidism of renal origin: Secondary | ICD-10-CM | POA: Diagnosis not present

## 2020-07-31 DIAGNOSIS — E785 Hyperlipidemia, unspecified: Secondary | ICD-10-CM | POA: Diagnosis not present

## 2020-07-31 DIAGNOSIS — D631 Anemia in chronic kidney disease: Secondary | ICD-10-CM | POA: Diagnosis not present

## 2020-07-31 DIAGNOSIS — Z992 Dependence on renal dialysis: Secondary | ICD-10-CM | POA: Diagnosis not present

## 2020-08-01 DIAGNOSIS — N2581 Secondary hyperparathyroidism of renal origin: Secondary | ICD-10-CM | POA: Diagnosis not present

## 2020-08-01 DIAGNOSIS — N186 End stage renal disease: Secondary | ICD-10-CM | POA: Diagnosis not present

## 2020-08-01 DIAGNOSIS — E785 Hyperlipidemia, unspecified: Secondary | ICD-10-CM | POA: Diagnosis not present

## 2020-08-01 DIAGNOSIS — D631 Anemia in chronic kidney disease: Secondary | ICD-10-CM | POA: Diagnosis not present

## 2020-08-01 DIAGNOSIS — Z4932 Encounter for adequacy testing for peritoneal dialysis: Secondary | ICD-10-CM | POA: Diagnosis not present

## 2020-08-01 DIAGNOSIS — Z992 Dependence on renal dialysis: Secondary | ICD-10-CM | POA: Diagnosis not present

## 2020-08-02 DIAGNOSIS — Z4932 Encounter for adequacy testing for peritoneal dialysis: Secondary | ICD-10-CM | POA: Diagnosis not present

## 2020-08-02 DIAGNOSIS — D631 Anemia in chronic kidney disease: Secondary | ICD-10-CM | POA: Diagnosis not present

## 2020-08-02 DIAGNOSIS — N2581 Secondary hyperparathyroidism of renal origin: Secondary | ICD-10-CM | POA: Diagnosis not present

## 2020-08-02 DIAGNOSIS — E785 Hyperlipidemia, unspecified: Secondary | ICD-10-CM | POA: Diagnosis not present

## 2020-08-02 DIAGNOSIS — N186 End stage renal disease: Secondary | ICD-10-CM | POA: Diagnosis not present

## 2020-08-02 DIAGNOSIS — Z992 Dependence on renal dialysis: Secondary | ICD-10-CM | POA: Diagnosis not present

## 2020-08-03 DIAGNOSIS — Z992 Dependence on renal dialysis: Secondary | ICD-10-CM | POA: Diagnosis not present

## 2020-08-03 DIAGNOSIS — N2581 Secondary hyperparathyroidism of renal origin: Secondary | ICD-10-CM | POA: Diagnosis not present

## 2020-08-03 DIAGNOSIS — N186 End stage renal disease: Secondary | ICD-10-CM | POA: Diagnosis not present

## 2020-08-03 DIAGNOSIS — E785 Hyperlipidemia, unspecified: Secondary | ICD-10-CM | POA: Diagnosis not present

## 2020-08-03 DIAGNOSIS — Z4932 Encounter for adequacy testing for peritoneal dialysis: Secondary | ICD-10-CM | POA: Diagnosis not present

## 2020-08-03 DIAGNOSIS — D631 Anemia in chronic kidney disease: Secondary | ICD-10-CM | POA: Diagnosis not present

## 2020-08-04 DIAGNOSIS — N186 End stage renal disease: Secondary | ICD-10-CM | POA: Diagnosis not present

## 2020-08-04 DIAGNOSIS — Z4932 Encounter for adequacy testing for peritoneal dialysis: Secondary | ICD-10-CM | POA: Diagnosis not present

## 2020-08-04 DIAGNOSIS — N2581 Secondary hyperparathyroidism of renal origin: Secondary | ICD-10-CM | POA: Diagnosis not present

## 2020-08-04 DIAGNOSIS — Z992 Dependence on renal dialysis: Secondary | ICD-10-CM | POA: Diagnosis not present

## 2020-08-04 DIAGNOSIS — E785 Hyperlipidemia, unspecified: Secondary | ICD-10-CM | POA: Diagnosis not present

## 2020-08-04 DIAGNOSIS — D631 Anemia in chronic kidney disease: Secondary | ICD-10-CM | POA: Diagnosis not present

## 2020-08-04 NOTE — Progress Notes (Signed)
Patient ID: Adrian Frye                 DOB: 04/22/1971                      MRN: [id number]     HPI: Adrian Frye is a 50 y.o. male referred by Dr. Radford Pax to HTN clinic. Patient follows with Dr. Radford Pax for sleep apnea. Primary cardiologist is Dr. Terri Skains with [facility]. He is also followed by nephrology for BP management. PMH is significant for DCM, HTN, OSA on CPAP, obesity, HLD, pulmonic valve endocarditis (completed abx 02/2020), and ESRD on peritoneal dialysis. EF normalized to 50-55% on echo 01/2020, updated echo being checked today.  At visit with Dr. Radford Pax on 07/02/20, BP was 160/100. Reported home BP 140/85. Amlodipine was increased to 7.5 mg daily. He was referred to the pharmacy HTN clinic as well as the [facility]. Due to atypical chest pain, performed [facility] which showed no ischemia. Of note, at visit with Dr. Terri Skains 02/2020, BP was elevated to 170/86 (on amlodipine 5 mg daily) but patient reported home BP well controlled and management was deferred to nephrology. At PCP visit 04/2020, BP was 128/84. Per chart review, BP appears to be variable.   Today, patient arrives in good spirits. Reports he accidentally was taking 2 tabs (10 mg) of amlodipine after last visit with Dr. Radford Pax and was experiencing dizziness. He realized he should have been taking 1.5 tablets (7.5 mg), and once he decreased to this dose his dizziness resolved and he has been tolerating it well since. Denies dizziness, headache, blurred vision, or swelling. Uses a wrist cuff to check his BP at home, reports it was 125/82 this morning, with most readings in the 130s-140s/80s. When his BP is high, he states he can always point to the cause. For example, the highest reading he saw was 152/89 which occurred after eating at a hibachi restaurant the night before. He has done his own research regarding which foods he should avoid, and after making these changes (eating  at home instead of eating out, not adding salt to food, reducing intake of high sodium foods) he has seen his BP come down 10-15 points. States he is not willing to change or add any medications at this time.   Current HTN meds: amlodipine 7.5 mg daily   Previously tried: Lisinopril, losartan (angioedema with both)  BP goal: <130/56mmHg  Family History: Diabetes in his mother; Heart disease in his father; Heart failure in his mother; Hypertension in his brother, brother, and mother.   Social History: Former smoker (quit 2011)  Diet: eats out less, cooks at home more; fruits/vegetables; hasn't added salt to food in 2 years (since found out about kidneys in 2019)  Exercise: walks twice a week for 20 minutes  Home BP readings: Wrist cuff  125/82, HR 84; most readings are 130s-140s/80s; highest 152/89; lowest 111/69   Labs:  02/24/2020: Scr 16.04 (ESRD), Na 138, K 3.3  Wt Readings from Last 3 Encounters:  07/02/20 (!) 300 lb 9.6 oz (136.4 kg)  05/11/20 (!) 306 lb 9.6 oz (139.1 kg)  05/05/20 (!) 307 lb 3.2 oz (139.3 kg)   BP Readings from Last 3 Encounters:  07/02/20 (!) 160/100  05/11/20 128/84  05/05/20 (!) 150/100   Pulse Readings from Last 3 Encounters:  07/02/20 84  05/11/20 68  05/05/20 88    Renal function: CrCl cannot be calculated (Patient's  most recent lab result is older than the maximum 21 days allowed.).  Past Medical History:  Diagnosis Date  . Allergic rhinitis   . Allergy   . Anemia   . Asthma    as a child  . Bacteremia   . Benign essential hypertension   . Dilated idiopathic cardiomyopathy ([place])    now resoved with EF 55% by echo 2011  . Edema   . Encounter for blood transfusion   . Endocarditis   . ESRD (end stage renal disease) ([place])   . ESRD on peritoneal dialysis ([place])   . Heart disease   . Hyperlipidemia   . Hyperparathyroidism, secondary ([place])   . Hypertension   . Morbid obesity ([place])   . Sleep apnea    c-pap  . UGI bleed 2011   ASA     Current Outpatient Medications on File Prior to Visit  Medication Sig Dispense Refill  . amLODipine (NORVASC) 5 MG tablet Take 1.5 tablets (7.5 mg total) by mouth daily. 135 tablet 3  . B Complex-C-Folic Acid (DIALYVITE 242 PO) Take 1 tablet by mouth daily.    . calcitRIOL (ROCALTROL) 0.25 MCG capsule Take 0.5 mcg by mouth daily.     . calcitRIOL (ROCALTROL) 0.5 MCG capsule Take 0.5 mcg by mouth daily.    . ferric citrate (AURYXIA) 1 GM 210 MG(Fe) tablet Take 840 mg by mouth 3 (three) times daily with meals.     Marland Kitchen gentamicin cream (GARAMYCIN) 0.1 % Apply 1 application topically as directed. Port site    . Methoxy PEG-Epoetin Beta (MIRCERA IJ) Inject into the skin.    . sildenafil (REVATIO) 20 MG tablet Take 20 mg by mouth 3 (three) times daily.    Marland Kitchen triamcinolone cream (KENALOG) 0.1 % Apply 1 application topically daily as needed (rash).     . VELPHORO 500 MG chewable tablet Chew 500 mg by mouth 3 (three) times daily.    Marland Kitchen zolpidem (AMBIEN CR) 12.5 MG CR tablet Take 1 tablet (12.5 mg total) by mouth at bedtime as needed for sleep. 30 tablet 5   No current facility-administered medications on file prior to visit.    Allergies  Allergen Reactions  . Lisinopril Swelling and Anaphylaxis    Angioedema  Other reaction(s): Angioedema (ALLERGY/intolerance)  . Losartan Swelling and Anaphylaxis     Assessment/Plan:  1. Hypertension - Blood pressure today in office is 178/96 which is elevated above goal goal <130/80 mmHg. However patient reports lower blood pressure readings at home, with today's home reading 125/82. Patient thinks this is related to feeling nervous at the doctor's office and does not want to add to or change any of his BP medications at this time, states if we did change anything he would not do it. Will respect patient's wishes and continue current medications--amlodipine 7.5 mg daily. Of note, he did not tolerate the 10 mg dose of amlodipine secondary to dizziness. Asked  if he would be willing to come back in for a follow up visit so we could compare his home cuff to our cuff to make sure his home readings are accurate since we were not making any medication changes today. He says he sees a lot of doctors and would rather follow up with nephrology for his BP management. Counseled patient to continue checking his BP at home and making positive lifestyle changes such as increasing exercise and continuing to limit sodium intake. Encouraged him to bring his BP cuff to his nephrologist appointment next week  and to ask them to compare it to their office cuff. Encouraged patient to follow up with [place] as needed.   Rebbeca Paul, PharmD PGY1 Pharmacy Resident 08/05/2020 10:27 AM

## 2020-08-05 ENCOUNTER — Ambulatory Visit (HOSPITAL_COMMUNITY): Payer: Medicare Other | Attending: Internal Medicine

## 2020-08-05 ENCOUNTER — Ambulatory Visit (INDEPENDENT_AMBULATORY_CARE_PROVIDER_SITE_OTHER): Payer: Medicare Other | Admitting: Student-PharmD

## 2020-08-05 ENCOUNTER — Other Ambulatory Visit: Payer: Self-pay

## 2020-08-05 VITALS — BP 178/96 | HR 83

## 2020-08-05 DIAGNOSIS — Z4932 Encounter for adequacy testing for peritoneal dialysis: Secondary | ICD-10-CM | POA: Diagnosis not present

## 2020-08-05 DIAGNOSIS — I330 Acute and subacute infective endocarditis: Secondary | ICD-10-CM | POA: Insufficient documentation

## 2020-08-05 DIAGNOSIS — N186 End stage renal disease: Secondary | ICD-10-CM | POA: Diagnosis not present

## 2020-08-05 DIAGNOSIS — I10 Essential (primary) hypertension: Secondary | ICD-10-CM | POA: Diagnosis not present

## 2020-08-05 DIAGNOSIS — E785 Hyperlipidemia, unspecified: Secondary | ICD-10-CM | POA: Diagnosis not present

## 2020-08-05 DIAGNOSIS — N2581 Secondary hyperparathyroidism of renal origin: Secondary | ICD-10-CM | POA: Diagnosis not present

## 2020-08-05 DIAGNOSIS — B958 Unspecified staphylococcus as the cause of diseases classified elsewhere: Secondary | ICD-10-CM | POA: Diagnosis not present

## 2020-08-05 DIAGNOSIS — D631 Anemia in chronic kidney disease: Secondary | ICD-10-CM | POA: Diagnosis not present

## 2020-08-05 DIAGNOSIS — Z992 Dependence on renal dialysis: Secondary | ICD-10-CM | POA: Diagnosis not present

## 2020-08-05 LAB — ECHOCARDIOGRAM COMPLETE
Area-P 1/2: 2.99 cm2
S' Lateral: 3.9 cm

## 2020-08-05 NOTE — Patient Instructions (Signed)
It was nice to see you today!  Your goal blood pressure is less than 130/80 mmHg. In clinic, your blood pressure was 178/96 mmHg.  Medication Changes: Continue your current BP medications: amlodipine 7.5 mg daily  Bring your blood pressure cuff with you to your nephrologist appointment so they can compare it to their office cuff.   Keep up the good work with diet and exercise. Aim for a diet full of vegetables, fruit and lean meats (chicken, [place], fish). Try to limit salt intake by eating fresh or frozen vegetables (instead of canned), rinse canned vegetables prior to cooking and do not add any additional salt to meals.   Please give [place] a call at [phone] with any questions or concerns.

## 2020-08-06 DIAGNOSIS — Z992 Dependence on renal dialysis: Secondary | ICD-10-CM | POA: Diagnosis not present

## 2020-08-06 DIAGNOSIS — N186 End stage renal disease: Secondary | ICD-10-CM | POA: Diagnosis not present

## 2020-08-06 DIAGNOSIS — N2581 Secondary hyperparathyroidism of renal origin: Secondary | ICD-10-CM | POA: Diagnosis not present

## 2020-08-06 DIAGNOSIS — D631 Anemia in chronic kidney disease: Secondary | ICD-10-CM | POA: Diagnosis not present

## 2020-08-06 DIAGNOSIS — E785 Hyperlipidemia, unspecified: Secondary | ICD-10-CM | POA: Diagnosis not present

## 2020-08-06 DIAGNOSIS — Z4932 Encounter for adequacy testing for peritoneal dialysis: Secondary | ICD-10-CM | POA: Diagnosis not present

## 2020-08-07 DIAGNOSIS — Z992 Dependence on renal dialysis: Secondary | ICD-10-CM | POA: Diagnosis not present

## 2020-08-07 DIAGNOSIS — N186 End stage renal disease: Secondary | ICD-10-CM | POA: Diagnosis not present

## 2020-08-07 DIAGNOSIS — E785 Hyperlipidemia, unspecified: Secondary | ICD-10-CM | POA: Diagnosis not present

## 2020-08-07 DIAGNOSIS — D631 Anemia in chronic kidney disease: Secondary | ICD-10-CM | POA: Diagnosis not present

## 2020-08-07 DIAGNOSIS — N2581 Secondary hyperparathyroidism of renal origin: Secondary | ICD-10-CM | POA: Diagnosis not present

## 2020-08-07 DIAGNOSIS — Z4932 Encounter for adequacy testing for peritoneal dialysis: Secondary | ICD-10-CM | POA: Diagnosis not present

## 2020-08-08 DIAGNOSIS — E785 Hyperlipidemia, unspecified: Secondary | ICD-10-CM | POA: Diagnosis not present

## 2020-08-08 DIAGNOSIS — D631 Anemia in chronic kidney disease: Secondary | ICD-10-CM | POA: Diagnosis not present

## 2020-08-08 DIAGNOSIS — Z4932 Encounter for adequacy testing for peritoneal dialysis: Secondary | ICD-10-CM | POA: Diagnosis not present

## 2020-08-08 DIAGNOSIS — N186 End stage renal disease: Secondary | ICD-10-CM | POA: Diagnosis not present

## 2020-08-08 DIAGNOSIS — N2581 Secondary hyperparathyroidism of renal origin: Secondary | ICD-10-CM | POA: Diagnosis not present

## 2020-08-08 DIAGNOSIS — Z992 Dependence on renal dialysis: Secondary | ICD-10-CM | POA: Diagnosis not present

## 2020-08-09 DIAGNOSIS — E785 Hyperlipidemia, unspecified: Secondary | ICD-10-CM | POA: Diagnosis not present

## 2020-08-09 DIAGNOSIS — Z4932 Encounter for adequacy testing for peritoneal dialysis: Secondary | ICD-10-CM | POA: Diagnosis not present

## 2020-08-09 DIAGNOSIS — N186 End stage renal disease: Secondary | ICD-10-CM | POA: Diagnosis not present

## 2020-08-09 DIAGNOSIS — N2581 Secondary hyperparathyroidism of renal origin: Secondary | ICD-10-CM | POA: Diagnosis not present

## 2020-08-09 DIAGNOSIS — D631 Anemia in chronic kidney disease: Secondary | ICD-10-CM | POA: Diagnosis not present

## 2020-08-09 DIAGNOSIS — Z992 Dependence on renal dialysis: Secondary | ICD-10-CM | POA: Diagnosis not present

## 2020-08-10 DIAGNOSIS — N186 End stage renal disease: Secondary | ICD-10-CM | POA: Diagnosis not present

## 2020-08-10 DIAGNOSIS — Z4932 Encounter for adequacy testing for peritoneal dialysis: Secondary | ICD-10-CM | POA: Diagnosis not present

## 2020-08-10 DIAGNOSIS — Z992 Dependence on renal dialysis: Secondary | ICD-10-CM | POA: Diagnosis not present

## 2020-08-10 DIAGNOSIS — E785 Hyperlipidemia, unspecified: Secondary | ICD-10-CM | POA: Diagnosis not present

## 2020-08-10 DIAGNOSIS — N2581 Secondary hyperparathyroidism of renal origin: Secondary | ICD-10-CM | POA: Diagnosis not present

## 2020-08-10 DIAGNOSIS — D631 Anemia in chronic kidney disease: Secondary | ICD-10-CM | POA: Diagnosis not present

## 2020-08-11 DIAGNOSIS — D631 Anemia in chronic kidney disease: Secondary | ICD-10-CM | POA: Diagnosis not present

## 2020-08-11 DIAGNOSIS — Z992 Dependence on renal dialysis: Secondary | ICD-10-CM | POA: Diagnosis not present

## 2020-08-11 DIAGNOSIS — N2581 Secondary hyperparathyroidism of renal origin: Secondary | ICD-10-CM | POA: Diagnosis not present

## 2020-08-11 DIAGNOSIS — Z4932 Encounter for adequacy testing for peritoneal dialysis: Secondary | ICD-10-CM | POA: Diagnosis not present

## 2020-08-11 DIAGNOSIS — N186 End stage renal disease: Secondary | ICD-10-CM | POA: Diagnosis not present

## 2020-08-11 DIAGNOSIS — E785 Hyperlipidemia, unspecified: Secondary | ICD-10-CM | POA: Diagnosis not present

## 2020-08-12 DIAGNOSIS — E785 Hyperlipidemia, unspecified: Secondary | ICD-10-CM | POA: Diagnosis not present

## 2020-08-12 DIAGNOSIS — N2581 Secondary hyperparathyroidism of renal origin: Secondary | ICD-10-CM | POA: Diagnosis not present

## 2020-08-12 DIAGNOSIS — Z992 Dependence on renal dialysis: Secondary | ICD-10-CM | POA: Diagnosis not present

## 2020-08-12 DIAGNOSIS — Z4932 Encounter for adequacy testing for peritoneal dialysis: Secondary | ICD-10-CM | POA: Diagnosis not present

## 2020-08-12 DIAGNOSIS — N186 End stage renal disease: Secondary | ICD-10-CM | POA: Diagnosis not present

## 2020-08-12 DIAGNOSIS — D631 Anemia in chronic kidney disease: Secondary | ICD-10-CM | POA: Diagnosis not present

## 2020-08-13 DIAGNOSIS — N186 End stage renal disease: Secondary | ICD-10-CM | POA: Diagnosis not present

## 2020-08-13 DIAGNOSIS — E785 Hyperlipidemia, unspecified: Secondary | ICD-10-CM | POA: Diagnosis not present

## 2020-08-13 DIAGNOSIS — N2581 Secondary hyperparathyroidism of renal origin: Secondary | ICD-10-CM | POA: Diagnosis not present

## 2020-08-13 DIAGNOSIS — E1122 Type 2 diabetes mellitus with diabetic chronic kidney disease: Secondary | ICD-10-CM | POA: Diagnosis not present

## 2020-08-13 DIAGNOSIS — Z992 Dependence on renal dialysis: Secondary | ICD-10-CM | POA: Diagnosis not present

## 2020-08-13 DIAGNOSIS — Z4932 Encounter for adequacy testing for peritoneal dialysis: Secondary | ICD-10-CM | POA: Diagnosis not present

## 2020-08-13 DIAGNOSIS — D631 Anemia in chronic kidney disease: Secondary | ICD-10-CM | POA: Diagnosis not present

## 2020-08-14 DIAGNOSIS — Z992 Dependence on renal dialysis: Secondary | ICD-10-CM | POA: Diagnosis not present

## 2020-08-14 DIAGNOSIS — Z4932 Encounter for adequacy testing for peritoneal dialysis: Secondary | ICD-10-CM | POA: Diagnosis not present

## 2020-08-14 DIAGNOSIS — N2581 Secondary hyperparathyroidism of renal origin: Secondary | ICD-10-CM | POA: Diagnosis not present

## 2020-08-14 DIAGNOSIS — D631 Anemia in chronic kidney disease: Secondary | ICD-10-CM | POA: Diagnosis not present

## 2020-08-14 DIAGNOSIS — E1129 Type 2 diabetes mellitus with other diabetic kidney complication: Secondary | ICD-10-CM | POA: Diagnosis not present

## 2020-08-14 DIAGNOSIS — E785 Hyperlipidemia, unspecified: Secondary | ICD-10-CM | POA: Diagnosis not present

## 2020-08-14 DIAGNOSIS — N186 End stage renal disease: Secondary | ICD-10-CM | POA: Diagnosis not present

## 2020-08-15 DIAGNOSIS — N186 End stage renal disease: Secondary | ICD-10-CM | POA: Diagnosis not present

## 2020-08-15 DIAGNOSIS — E785 Hyperlipidemia, unspecified: Secondary | ICD-10-CM | POA: Diagnosis not present

## 2020-08-15 DIAGNOSIS — E1129 Type 2 diabetes mellitus with other diabetic kidney complication: Secondary | ICD-10-CM | POA: Diagnosis not present

## 2020-08-15 DIAGNOSIS — N2581 Secondary hyperparathyroidism of renal origin: Secondary | ICD-10-CM | POA: Diagnosis not present

## 2020-08-15 DIAGNOSIS — D631 Anemia in chronic kidney disease: Secondary | ICD-10-CM | POA: Diagnosis not present

## 2020-08-15 DIAGNOSIS — Z4932 Encounter for adequacy testing for peritoneal dialysis: Secondary | ICD-10-CM | POA: Diagnosis not present

## 2020-08-15 DIAGNOSIS — Z992 Dependence on renal dialysis: Secondary | ICD-10-CM | POA: Diagnosis not present

## 2020-08-16 DIAGNOSIS — N186 End stage renal disease: Secondary | ICD-10-CM | POA: Diagnosis not present

## 2020-08-16 DIAGNOSIS — E785 Hyperlipidemia, unspecified: Secondary | ICD-10-CM | POA: Diagnosis not present

## 2020-08-16 DIAGNOSIS — Z992 Dependence on renal dialysis: Secondary | ICD-10-CM | POA: Diagnosis not present

## 2020-08-16 DIAGNOSIS — E1129 Type 2 diabetes mellitus with other diabetic kidney complication: Secondary | ICD-10-CM | POA: Diagnosis not present

## 2020-08-16 DIAGNOSIS — Z4932 Encounter for adequacy testing for peritoneal dialysis: Secondary | ICD-10-CM | POA: Diagnosis not present

## 2020-08-16 DIAGNOSIS — D631 Anemia in chronic kidney disease: Secondary | ICD-10-CM | POA: Diagnosis not present

## 2020-08-16 DIAGNOSIS — N2581 Secondary hyperparathyroidism of renal origin: Secondary | ICD-10-CM | POA: Diagnosis not present

## 2020-08-17 ENCOUNTER — Encounter: Payer: Self-pay | Admitting: Family Medicine

## 2020-08-17 DIAGNOSIS — D631 Anemia in chronic kidney disease: Secondary | ICD-10-CM | POA: Diagnosis not present

## 2020-08-17 DIAGNOSIS — N186 End stage renal disease: Secondary | ICD-10-CM | POA: Diagnosis not present

## 2020-08-17 DIAGNOSIS — Z4932 Encounter for adequacy testing for peritoneal dialysis: Secondary | ICD-10-CM | POA: Diagnosis not present

## 2020-08-17 DIAGNOSIS — E1129 Type 2 diabetes mellitus with other diabetic kidney complication: Secondary | ICD-10-CM | POA: Diagnosis not present

## 2020-08-17 DIAGNOSIS — E785 Hyperlipidemia, unspecified: Secondary | ICD-10-CM | POA: Diagnosis not present

## 2020-08-17 DIAGNOSIS — N2581 Secondary hyperparathyroidism of renal origin: Secondary | ICD-10-CM | POA: Diagnosis not present

## 2020-08-17 DIAGNOSIS — Z992 Dependence on renal dialysis: Secondary | ICD-10-CM | POA: Diagnosis not present

## 2020-08-18 DIAGNOSIS — Z992 Dependence on renal dialysis: Secondary | ICD-10-CM | POA: Diagnosis not present

## 2020-08-18 DIAGNOSIS — E785 Hyperlipidemia, unspecified: Secondary | ICD-10-CM | POA: Diagnosis not present

## 2020-08-18 DIAGNOSIS — N186 End stage renal disease: Secondary | ICD-10-CM | POA: Diagnosis not present

## 2020-08-18 DIAGNOSIS — N2581 Secondary hyperparathyroidism of renal origin: Secondary | ICD-10-CM | POA: Diagnosis not present

## 2020-08-18 DIAGNOSIS — Z4932 Encounter for adequacy testing for peritoneal dialysis: Secondary | ICD-10-CM | POA: Diagnosis not present

## 2020-08-18 DIAGNOSIS — D631 Anemia in chronic kidney disease: Secondary | ICD-10-CM | POA: Diagnosis not present

## 2020-08-18 DIAGNOSIS — E1129 Type 2 diabetes mellitus with other diabetic kidney complication: Secondary | ICD-10-CM | POA: Diagnosis not present

## 2020-08-19 ENCOUNTER — Other Ambulatory Visit: Payer: Self-pay

## 2020-08-19 ENCOUNTER — Encounter: Payer: Self-pay | Admitting: Family Medicine

## 2020-08-19 ENCOUNTER — Telehealth (INDEPENDENT_AMBULATORY_CARE_PROVIDER_SITE_OTHER): Payer: Medicare Other | Admitting: Family Medicine

## 2020-08-19 VITALS — BP 128/73 | HR 82 | Temp 97.7°F | Wt 302.0 lb

## 2020-08-19 DIAGNOSIS — R062 Wheezing: Secondary | ICD-10-CM | POA: Diagnosis not present

## 2020-08-19 DIAGNOSIS — Z4932 Encounter for adequacy testing for peritoneal dialysis: Secondary | ICD-10-CM | POA: Diagnosis not present

## 2020-08-19 DIAGNOSIS — E785 Hyperlipidemia, unspecified: Secondary | ICD-10-CM | POA: Diagnosis not present

## 2020-08-19 DIAGNOSIS — Z8709 Personal history of other diseases of the respiratory system: Secondary | ICD-10-CM | POA: Diagnosis not present

## 2020-08-19 DIAGNOSIS — R140 Abdominal distension (gaseous): Secondary | ICD-10-CM

## 2020-08-19 DIAGNOSIS — D631 Anemia in chronic kidney disease: Secondary | ICD-10-CM | POA: Diagnosis not present

## 2020-08-19 DIAGNOSIS — N2581 Secondary hyperparathyroidism of renal origin: Secondary | ICD-10-CM | POA: Diagnosis not present

## 2020-08-19 DIAGNOSIS — N186 End stage renal disease: Secondary | ICD-10-CM | POA: Diagnosis not present

## 2020-08-19 DIAGNOSIS — E1129 Type 2 diabetes mellitus with other diabetic kidney complication: Secondary | ICD-10-CM | POA: Diagnosis not present

## 2020-08-19 DIAGNOSIS — Z992 Dependence on renal dialysis: Secondary | ICD-10-CM | POA: Diagnosis not present

## 2020-08-19 MED ORDER — ALBUTEROL SULFATE HFA 108 (90 BASE) MCG/ACT IN AERS
2.0000 | INHALATION_SPRAY | Freq: Four times a day (QID) | RESPIRATORY_TRACT | 0 refills | Status: DC | PRN
Start: 2020-08-19 — End: 2020-09-14

## 2020-08-19 NOTE — Progress Notes (Signed)
   Subjective:  Documentation for virtual audio and video telecommunications through [facility] encounter:  The patient was located at home. 2 patient identifiers used.  The provider was located in the office. The patient did consent to this visit and is aware of possible charges through their insurance for this visit.  The other persons participating in this telemedicine service were none. Time spent on call was 24 minutes and in review of previous records 30 minutes total.  This virtual service is not related to other E/M service within previous 7 days.   Patient ID: Adrian Frye, male    DOB: [birth year], 50 y.o.   MRN: [id number]  HPI Chief Complaint  Patient presents with  . stomach    Stomach gas x 1 month, upset stomach, had diarrhea a week ago. If drinking or eating the uncomfortable goes a while for a little bit. Burping and Farting will relief some of it. Some wheezing in chest    Complains of abdominal gas pain x 1 month. Pain is relieved with burping or passing gas States pain is also improved with eating or drinking.   States he has been eating out 1-2 times per day and his wife cooks supper. Eats sushi some times.  No carbonated beverages.   Initially he had some loose stools.  Stools are light brown and normal appearing now.   Colonsocopy last year.   Reports normal echocardiogram which was fine.   Complains of intermittent wheezing for the past 2 months. Occurs when going up steps and at times with sitting.  Feels like he has phlegm in his chest. No issues with walking.  History of asthma but no problems for years. Does not have an inhaler.  Is not on allergy medication.   Denies fever, chills, dizziness, chest pain, palpitations, shortness of breath,  N/V/D, urinary symptoms, LE edema.    Reviewed allergies, medications, past medical, surgical, family, and social history.    Review of Systems Pertinent positives and negatives in the history of  present illness.     Objective:   Physical Exam BP 128/73   Pulse 82   Temp 97.7 F (36.5 C)   Wt (!) 302 lb (137 kg)   BMI 47.30 kg/m   Alert and oriented in no acute distress.  Respirations unlabored.  Normal speech, mood and thought process.      Assessment & Plan:  Flatulence/gas pain/belching  Wheezing - Plan: albuterol (VENTOLIN HFA) 108 (90 Base) MCG/ACT inhaler  History of asthma - Plan: albuterol (VENTOLIN HFA) 108 (90 Base) MCG/ACT inhaler  Discussed that there are no signs of infection.  No need for stool studies at this time. Recommend trying over-the-counter Gas-X or IBgard for the next couple of weeks to see if this helps with the gas.  We also discussed eating more gas producing foods.  Avoid gum chewing and drinking through straws. History of asthma that has not been an issue in years but he has been wheezing more recently.  I will prescribe an albuterol inhaler and recommend he take plain Mucinex to help with the phlegm. He will follow-up if this is not improving or if he is needing the albuterol inhaler more than twice weekly.  Consider premedicating before exercise.

## 2020-08-19 NOTE — Patient Instructions (Signed)
Try eating foods that are less gas producing.  Raw fruits and vegetables and foods that are hard to digest increase gas production in your gut. You can try over-the-counter Gas-X and or IBgard. I also recommend stop gum chewing and avoid drinking through straws.  Continue avoiding carbonated beverages.  Follow-up if this is not improving in the next 2 to 4 weeks or if your symptoms are worsening.  Try plain Mucinex for mucus and albuterol inhaler as needed.  If you are needing this more than twice weekly then, let me know.    Low-FODMAP Eating Plan  FODMAP stands for fermentable oligosaccharides, disaccharides, monosaccharides, and polyols. These are sugars that are hard for some people to digest. A low-FODMAP eating plan may help some people who have irritable bowel syndrome (IBS) and certain other bowel (intestinal) diseases to manage their symptoms. This meal plan can be complicated to follow. Work with a diet and nutrition specialist (dietitian) to make a low-FODMAP eating plan that is right for you. A dietitian can help make sure that you get enough nutrition from this diet. What are tips for following this plan? Reading food labels  Check labels for hidden FODMAPs such as: ? High-fructose syrup. ? Honey. ? Agave. ? Natural fruit flavors. ? Onion or garlic powder.  Choose low-FODMAP foods that contain 3-4 grams of fiber per serving.  Check food labels for serving sizes. Eat only one serving at a time to make sure FODMAP levels stay low. Shopping  Shop with a list of foods that are recommended on this diet and make a meal plan. Meal planning  Follow a low-FODMAP eating plan for up to 6 weeks, or as told by your health care provider or dietitian.  To follow the eating plan: 1. Eliminate high-FODMAP foods from your diet completely. Choose only low-FODMAP foods to eat. You will do this for 2-6 weeks. 2. Gradually reintroduce high-FODMAP foods into your diet one at a time. Most  people should wait a few days before introducing the next new high-FODMAP food into their meal plan. Your dietitian can recommend how quickly you may reintroduce foods. 3. Keep a daily record of what and how much you eat and drink. Make note of any symptoms that you have after eating. 4. Review your daily record with a dietitian regularly to identify which foods you can eat and which foods you should avoid. General tips  Drink enough fluid each day to keep your urine pale yellow.  Avoid processed foods. These often have added sugar and may be high in FODMAPs.  Avoid most dairy products, whole grains, and sweeteners.  Work with a dietitian to make sure you get enough fiber in your diet.  Avoid high FODMAP foods at meals to manage symptoms. Recommended foods Fruits Bananas, oranges, tangerines, lemons, limes, blueberries, raspberries, strawberries, grapes, cantaloupe, honeydew melon, kiwi, papaya, passion fruit, and pineapple. Limited amounts of dried cranberries, banana chips, and shredded coconut. Vegetables Eggplant, zucchini, cucumber, peppers, green beans, bean sprouts, lettuce, arugula, kale, Swiss chard, spinach, collard greens, bok choy, summer squash, potato, and tomato. Limited amounts of corn, carrot, and sweet potato. Green parts of scallions. Grains Gluten-free grains, such as rice, oats, buckwheat, quinoa, corn, polenta, and millet. Gluten-free pasta, bread, or cereal. Rice noodles. Corn tortillas. Meats and other proteins Unseasoned beef, pork, poultry, or fish. Eggs. Berniece Salines. Tofu (firm) and tempeh. Limited amounts of nuts and seeds, such as almonds, walnuts, [place] nuts, pecans, peanuts, nut butters, pumpkin seeds, chia seeds, and sunflower  seeds. Dairy Lactose-free milk, yogurt, and kefir. Lactose-free cottage cheese and ice cream. Non-dairy milks, such as almond, coconut, hemp, and rice milk. Non-dairy yogurt. Limited amounts of goat cheese, brie, mozzarella, parmesan, swiss,  and other hard cheeses. Fats and oils Butter-free spreads. Vegetable oils, such as olive, canola, and sunflower oil. Seasoning and other foods Artificial sweeteners with names that do not end in "ol," such as aspartame, saccharine, and stevia. Maple syrup, white table sugar, raw sugar, brown sugar, and molasses. Mayonnaise, soy sauce, and tamari. Fresh basil, coriander, parsley, rosemary, and thyme. Beverages Water and mineral water. Sugar-sweetened soft drinks. Small amounts of orange juice or cranberry juice. Black and green tea. Most dry wines. Coffee. The items listed above may not be a complete list of foods and beverages you can eat. Contact a dietitian for more information. Foods to avoid Fruits Fresh, dried, and juiced forms of apple, pear, watermelon, peach, plum, cherries, apricots, blackberries, boysenberries, figs, nectarines, and mango. Avocado. Vegetables Chicory root, artichoke, asparagus, cabbage, snow peas, Brussels sprouts, broccoli, sugar snap peas, mushrooms, celery, and cauliflower. Onions, garlic, leeks, and the white part of scallions. Grains Wheat, including kamut, durum, and semolina. Barley and bulgur. Couscous. Wheat-based cereals. Wheat noodles, bread, crackers, and pastries. Meats and other proteins Fried or fatty meat. Sausage. Cashews and pistachios. Soybeans, baked beans, black beans, chickpeas, kidney beans, fava beans, navy beans, lentils, black-eyed peas, and split peas. Dairy Milk, yogurt, ice cream, and soft cheese. Cream and sour cream. Milk-based sauces. Custard. Buttermilk. Soy milk. Seasoning and other foods Any sugar-free gum or candy. Foods that contain artificial sweeteners such as sorbitol, mannitol, isomalt, or xylitol. Foods that contain honey, high-fructose corn syrup, or agave. Bouillon, vegetable stock, beef stock, and chicken stock. Garlic and onion powder. Condiments made with onion, such as hummus, chutney, pickles, relish, salad dressing, and  salsa. Tomato paste. Beverages Chicory-based drinks. Coffee substitutes. Chamomile tea. Fennel tea. Sweet or fortified wines such as port or sherry. Diet soft drinks made with isomalt, mannitol, maltitol, sorbitol, or xylitol. Apple, pear, and mango juice. Juices with high-fructose corn syrup. The items listed above may not be a complete list of foods and beverages you should avoid. Contact a dietitian for more information. Summary  FODMAP stands for fermentable oligosaccharides, disaccharides, monosaccharides, and polyols. These are sugars that are hard for some people to digest.  A low-FODMAP eating plan is a short-term diet that helps to ease symptoms of certain bowel diseases.  The eating plan usually lasts up to 6 weeks. After that, high-FODMAP foods are reintroduced gradually and one at a time. This can help you find out which foods may be causing symptoms.  A low-FODMAP eating plan can be complicated. It is best to work with a dietitian who has experience with this type of plan. This information is not intended to replace advice given to you by your health care provider. Make sure you discuss any questions you have with your health care provider. Document Revised: 09/19/2019 Document Reviewed: 09/19/2019 Elsevier Patient Education  [place].

## 2020-08-20 DIAGNOSIS — Z4932 Encounter for adequacy testing for peritoneal dialysis: Secondary | ICD-10-CM | POA: Diagnosis not present

## 2020-08-20 DIAGNOSIS — Z992 Dependence on renal dialysis: Secondary | ICD-10-CM | POA: Diagnosis not present

## 2020-08-20 DIAGNOSIS — E1129 Type 2 diabetes mellitus with other diabetic kidney complication: Secondary | ICD-10-CM | POA: Diagnosis not present

## 2020-08-20 DIAGNOSIS — N2581 Secondary hyperparathyroidism of renal origin: Secondary | ICD-10-CM | POA: Diagnosis not present

## 2020-08-20 DIAGNOSIS — N186 End stage renal disease: Secondary | ICD-10-CM | POA: Diagnosis not present

## 2020-08-20 DIAGNOSIS — D631 Anemia in chronic kidney disease: Secondary | ICD-10-CM | POA: Diagnosis not present

## 2020-08-20 DIAGNOSIS — E785 Hyperlipidemia, unspecified: Secondary | ICD-10-CM | POA: Diagnosis not present

## 2020-08-21 DIAGNOSIS — E1129 Type 2 diabetes mellitus with other diabetic kidney complication: Secondary | ICD-10-CM | POA: Diagnosis not present

## 2020-08-21 DIAGNOSIS — Z992 Dependence on renal dialysis: Secondary | ICD-10-CM | POA: Diagnosis not present

## 2020-08-21 DIAGNOSIS — N186 End stage renal disease: Secondary | ICD-10-CM | POA: Diagnosis not present

## 2020-08-21 DIAGNOSIS — E785 Hyperlipidemia, unspecified: Secondary | ICD-10-CM | POA: Diagnosis not present

## 2020-08-21 DIAGNOSIS — N2581 Secondary hyperparathyroidism of renal origin: Secondary | ICD-10-CM | POA: Diagnosis not present

## 2020-08-21 DIAGNOSIS — D631 Anemia in chronic kidney disease: Secondary | ICD-10-CM | POA: Diagnosis not present

## 2020-08-21 DIAGNOSIS — Z4932 Encounter for adequacy testing for peritoneal dialysis: Secondary | ICD-10-CM | POA: Diagnosis not present

## 2020-08-22 DIAGNOSIS — E1129 Type 2 diabetes mellitus with other diabetic kidney complication: Secondary | ICD-10-CM | POA: Diagnosis not present

## 2020-08-22 DIAGNOSIS — E785 Hyperlipidemia, unspecified: Secondary | ICD-10-CM | POA: Diagnosis not present

## 2020-08-22 DIAGNOSIS — N186 End stage renal disease: Secondary | ICD-10-CM | POA: Diagnosis not present

## 2020-08-22 DIAGNOSIS — Z992 Dependence on renal dialysis: Secondary | ICD-10-CM | POA: Diagnosis not present

## 2020-08-22 DIAGNOSIS — N2581 Secondary hyperparathyroidism of renal origin: Secondary | ICD-10-CM | POA: Diagnosis not present

## 2020-08-22 DIAGNOSIS — D631 Anemia in chronic kidney disease: Secondary | ICD-10-CM | POA: Diagnosis not present

## 2020-08-22 DIAGNOSIS — Z4932 Encounter for adequacy testing for peritoneal dialysis: Secondary | ICD-10-CM | POA: Diagnosis not present

## 2020-08-23 DIAGNOSIS — E785 Hyperlipidemia, unspecified: Secondary | ICD-10-CM | POA: Diagnosis not present

## 2020-08-23 DIAGNOSIS — E1129 Type 2 diabetes mellitus with other diabetic kidney complication: Secondary | ICD-10-CM | POA: Diagnosis not present

## 2020-08-23 DIAGNOSIS — N186 End stage renal disease: Secondary | ICD-10-CM | POA: Diagnosis not present

## 2020-08-23 DIAGNOSIS — Z992 Dependence on renal dialysis: Secondary | ICD-10-CM | POA: Diagnosis not present

## 2020-08-23 DIAGNOSIS — D631 Anemia in chronic kidney disease: Secondary | ICD-10-CM | POA: Diagnosis not present

## 2020-08-23 DIAGNOSIS — N2581 Secondary hyperparathyroidism of renal origin: Secondary | ICD-10-CM | POA: Diagnosis not present

## 2020-08-23 DIAGNOSIS — Z4932 Encounter for adequacy testing for peritoneal dialysis: Secondary | ICD-10-CM | POA: Diagnosis not present

## 2020-08-24 DIAGNOSIS — N2581 Secondary hyperparathyroidism of renal origin: Secondary | ICD-10-CM | POA: Diagnosis not present

## 2020-08-24 DIAGNOSIS — Z992 Dependence on renal dialysis: Secondary | ICD-10-CM | POA: Diagnosis not present

## 2020-08-24 DIAGNOSIS — N186 End stage renal disease: Secondary | ICD-10-CM | POA: Diagnosis not present

## 2020-08-24 DIAGNOSIS — D631 Anemia in chronic kidney disease: Secondary | ICD-10-CM | POA: Diagnosis not present

## 2020-08-24 DIAGNOSIS — E1129 Type 2 diabetes mellitus with other diabetic kidney complication: Secondary | ICD-10-CM | POA: Diagnosis not present

## 2020-08-24 DIAGNOSIS — Z4932 Encounter for adequacy testing for peritoneal dialysis: Secondary | ICD-10-CM | POA: Diagnosis not present

## 2020-08-24 DIAGNOSIS — E785 Hyperlipidemia, unspecified: Secondary | ICD-10-CM | POA: Diagnosis not present

## 2020-08-25 DIAGNOSIS — N186 End stage renal disease: Secondary | ICD-10-CM | POA: Diagnosis not present

## 2020-08-25 DIAGNOSIS — Z4932 Encounter for adequacy testing for peritoneal dialysis: Secondary | ICD-10-CM | POA: Diagnosis not present

## 2020-08-25 DIAGNOSIS — E785 Hyperlipidemia, unspecified: Secondary | ICD-10-CM | POA: Diagnosis not present

## 2020-08-25 DIAGNOSIS — Z992 Dependence on renal dialysis: Secondary | ICD-10-CM | POA: Diagnosis not present

## 2020-08-25 DIAGNOSIS — N2581 Secondary hyperparathyroidism of renal origin: Secondary | ICD-10-CM | POA: Diagnosis not present

## 2020-08-25 DIAGNOSIS — D631 Anemia in chronic kidney disease: Secondary | ICD-10-CM | POA: Diagnosis not present

## 2020-08-25 DIAGNOSIS — E1129 Type 2 diabetes mellitus with other diabetic kidney complication: Secondary | ICD-10-CM | POA: Diagnosis not present

## 2020-08-26 DIAGNOSIS — Z992 Dependence on renal dialysis: Secondary | ICD-10-CM | POA: Diagnosis not present

## 2020-08-26 DIAGNOSIS — N2581 Secondary hyperparathyroidism of renal origin: Secondary | ICD-10-CM | POA: Diagnosis not present

## 2020-08-26 DIAGNOSIS — Z4932 Encounter for adequacy testing for peritoneal dialysis: Secondary | ICD-10-CM | POA: Diagnosis not present

## 2020-08-26 DIAGNOSIS — E785 Hyperlipidemia, unspecified: Secondary | ICD-10-CM | POA: Diagnosis not present

## 2020-08-26 DIAGNOSIS — E1129 Type 2 diabetes mellitus with other diabetic kidney complication: Secondary | ICD-10-CM | POA: Diagnosis not present

## 2020-08-26 DIAGNOSIS — N186 End stage renal disease: Secondary | ICD-10-CM | POA: Diagnosis not present

## 2020-08-26 DIAGNOSIS — D631 Anemia in chronic kidney disease: Secondary | ICD-10-CM | POA: Diagnosis not present

## 2020-08-27 DIAGNOSIS — E1129 Type 2 diabetes mellitus with other diabetic kidney complication: Secondary | ICD-10-CM | POA: Diagnosis not present

## 2020-08-27 DIAGNOSIS — Z4932 Encounter for adequacy testing for peritoneal dialysis: Secondary | ICD-10-CM | POA: Diagnosis not present

## 2020-08-27 DIAGNOSIS — D631 Anemia in chronic kidney disease: Secondary | ICD-10-CM | POA: Diagnosis not present

## 2020-08-27 DIAGNOSIS — Z992 Dependence on renal dialysis: Secondary | ICD-10-CM | POA: Diagnosis not present

## 2020-08-27 DIAGNOSIS — E785 Hyperlipidemia, unspecified: Secondary | ICD-10-CM | POA: Diagnosis not present

## 2020-08-27 DIAGNOSIS — N2581 Secondary hyperparathyroidism of renal origin: Secondary | ICD-10-CM | POA: Diagnosis not present

## 2020-08-27 DIAGNOSIS — N186 End stage renal disease: Secondary | ICD-10-CM | POA: Diagnosis not present

## 2020-08-28 DIAGNOSIS — N186 End stage renal disease: Secondary | ICD-10-CM | POA: Diagnosis not present

## 2020-08-28 DIAGNOSIS — E785 Hyperlipidemia, unspecified: Secondary | ICD-10-CM | POA: Diagnosis not present

## 2020-08-28 DIAGNOSIS — E1129 Type 2 diabetes mellitus with other diabetic kidney complication: Secondary | ICD-10-CM | POA: Diagnosis not present

## 2020-08-28 DIAGNOSIS — Z4932 Encounter for adequacy testing for peritoneal dialysis: Secondary | ICD-10-CM | POA: Diagnosis not present

## 2020-08-28 DIAGNOSIS — N2581 Secondary hyperparathyroidism of renal origin: Secondary | ICD-10-CM | POA: Diagnosis not present

## 2020-08-28 DIAGNOSIS — Z992 Dependence on renal dialysis: Secondary | ICD-10-CM | POA: Diagnosis not present

## 2020-08-28 DIAGNOSIS — D631 Anemia in chronic kidney disease: Secondary | ICD-10-CM | POA: Diagnosis not present

## 2020-08-29 DIAGNOSIS — E1129 Type 2 diabetes mellitus with other diabetic kidney complication: Secondary | ICD-10-CM | POA: Diagnosis not present

## 2020-08-29 DIAGNOSIS — D631 Anemia in chronic kidney disease: Secondary | ICD-10-CM | POA: Diagnosis not present

## 2020-08-29 DIAGNOSIS — N2581 Secondary hyperparathyroidism of renal origin: Secondary | ICD-10-CM | POA: Diagnosis not present

## 2020-08-29 DIAGNOSIS — E785 Hyperlipidemia, unspecified: Secondary | ICD-10-CM | POA: Diagnosis not present

## 2020-08-29 DIAGNOSIS — Z4932 Encounter for adequacy testing for peritoneal dialysis: Secondary | ICD-10-CM | POA: Diagnosis not present

## 2020-08-29 DIAGNOSIS — N186 End stage renal disease: Secondary | ICD-10-CM | POA: Diagnosis not present

## 2020-08-29 DIAGNOSIS — Z992 Dependence on renal dialysis: Secondary | ICD-10-CM | POA: Diagnosis not present

## 2020-08-30 DIAGNOSIS — E785 Hyperlipidemia, unspecified: Secondary | ICD-10-CM | POA: Diagnosis not present

## 2020-08-30 DIAGNOSIS — Z4932 Encounter for adequacy testing for peritoneal dialysis: Secondary | ICD-10-CM | POA: Diagnosis not present

## 2020-08-30 DIAGNOSIS — E1129 Type 2 diabetes mellitus with other diabetic kidney complication: Secondary | ICD-10-CM | POA: Diagnosis not present

## 2020-08-30 DIAGNOSIS — Z992 Dependence on renal dialysis: Secondary | ICD-10-CM | POA: Diagnosis not present

## 2020-08-30 DIAGNOSIS — N2581 Secondary hyperparathyroidism of renal origin: Secondary | ICD-10-CM | POA: Diagnosis not present

## 2020-08-30 DIAGNOSIS — D631 Anemia in chronic kidney disease: Secondary | ICD-10-CM | POA: Diagnosis not present

## 2020-08-30 DIAGNOSIS — N186 End stage renal disease: Secondary | ICD-10-CM | POA: Diagnosis not present

## 2020-08-31 DIAGNOSIS — E1129 Type 2 diabetes mellitus with other diabetic kidney complication: Secondary | ICD-10-CM | POA: Diagnosis not present

## 2020-08-31 DIAGNOSIS — E785 Hyperlipidemia, unspecified: Secondary | ICD-10-CM | POA: Diagnosis not present

## 2020-08-31 DIAGNOSIS — D631 Anemia in chronic kidney disease: Secondary | ICD-10-CM | POA: Diagnosis not present

## 2020-08-31 DIAGNOSIS — N2581 Secondary hyperparathyroidism of renal origin: Secondary | ICD-10-CM | POA: Diagnosis not present

## 2020-08-31 DIAGNOSIS — Z4932 Encounter for adequacy testing for peritoneal dialysis: Secondary | ICD-10-CM | POA: Diagnosis not present

## 2020-08-31 DIAGNOSIS — N186 End stage renal disease: Secondary | ICD-10-CM | POA: Diagnosis not present

## 2020-08-31 DIAGNOSIS — Z992 Dependence on renal dialysis: Secondary | ICD-10-CM | POA: Diagnosis not present

## 2020-09-01 DIAGNOSIS — Z992 Dependence on renal dialysis: Secondary | ICD-10-CM | POA: Diagnosis not present

## 2020-09-01 DIAGNOSIS — E785 Hyperlipidemia, unspecified: Secondary | ICD-10-CM | POA: Diagnosis not present

## 2020-09-01 DIAGNOSIS — N186 End stage renal disease: Secondary | ICD-10-CM | POA: Diagnosis not present

## 2020-09-01 DIAGNOSIS — D631 Anemia in chronic kidney disease: Secondary | ICD-10-CM | POA: Diagnosis not present

## 2020-09-01 DIAGNOSIS — Z4932 Encounter for adequacy testing for peritoneal dialysis: Secondary | ICD-10-CM | POA: Diagnosis not present

## 2020-09-01 DIAGNOSIS — E1129 Type 2 diabetes mellitus with other diabetic kidney complication: Secondary | ICD-10-CM | POA: Diagnosis not present

## 2020-09-01 DIAGNOSIS — N2581 Secondary hyperparathyroidism of renal origin: Secondary | ICD-10-CM | POA: Diagnosis not present

## 2020-09-02 DIAGNOSIS — N2581 Secondary hyperparathyroidism of renal origin: Secondary | ICD-10-CM | POA: Diagnosis not present

## 2020-09-02 DIAGNOSIS — E785 Hyperlipidemia, unspecified: Secondary | ICD-10-CM | POA: Diagnosis not present

## 2020-09-02 DIAGNOSIS — D631 Anemia in chronic kidney disease: Secondary | ICD-10-CM | POA: Diagnosis not present

## 2020-09-02 DIAGNOSIS — Z992 Dependence on renal dialysis: Secondary | ICD-10-CM | POA: Diagnosis not present

## 2020-09-02 DIAGNOSIS — Z4932 Encounter for adequacy testing for peritoneal dialysis: Secondary | ICD-10-CM | POA: Diagnosis not present

## 2020-09-02 DIAGNOSIS — E1129 Type 2 diabetes mellitus with other diabetic kidney complication: Secondary | ICD-10-CM | POA: Diagnosis not present

## 2020-09-02 DIAGNOSIS — N186 End stage renal disease: Secondary | ICD-10-CM | POA: Diagnosis not present

## 2020-09-03 DIAGNOSIS — D631 Anemia in chronic kidney disease: Secondary | ICD-10-CM | POA: Diagnosis not present

## 2020-09-03 DIAGNOSIS — Z992 Dependence on renal dialysis: Secondary | ICD-10-CM | POA: Diagnosis not present

## 2020-09-03 DIAGNOSIS — Z4932 Encounter for adequacy testing for peritoneal dialysis: Secondary | ICD-10-CM | POA: Diagnosis not present

## 2020-09-03 DIAGNOSIS — N186 End stage renal disease: Secondary | ICD-10-CM | POA: Diagnosis not present

## 2020-09-03 DIAGNOSIS — E1129 Type 2 diabetes mellitus with other diabetic kidney complication: Secondary | ICD-10-CM | POA: Diagnosis not present

## 2020-09-03 DIAGNOSIS — E785 Hyperlipidemia, unspecified: Secondary | ICD-10-CM | POA: Diagnosis not present

## 2020-09-03 DIAGNOSIS — N2581 Secondary hyperparathyroidism of renal origin: Secondary | ICD-10-CM | POA: Diagnosis not present

## 2020-09-04 DIAGNOSIS — E785 Hyperlipidemia, unspecified: Secondary | ICD-10-CM | POA: Diagnosis not present

## 2020-09-04 DIAGNOSIS — Z992 Dependence on renal dialysis: Secondary | ICD-10-CM | POA: Diagnosis not present

## 2020-09-04 DIAGNOSIS — N2581 Secondary hyperparathyroidism of renal origin: Secondary | ICD-10-CM | POA: Diagnosis not present

## 2020-09-04 DIAGNOSIS — E1129 Type 2 diabetes mellitus with other diabetic kidney complication: Secondary | ICD-10-CM | POA: Diagnosis not present

## 2020-09-04 DIAGNOSIS — Z4932 Encounter for adequacy testing for peritoneal dialysis: Secondary | ICD-10-CM | POA: Diagnosis not present

## 2020-09-04 DIAGNOSIS — D631 Anemia in chronic kidney disease: Secondary | ICD-10-CM | POA: Diagnosis not present

## 2020-09-04 DIAGNOSIS — N186 End stage renal disease: Secondary | ICD-10-CM | POA: Diagnosis not present

## 2020-09-05 DIAGNOSIS — D631 Anemia in chronic kidney disease: Secondary | ICD-10-CM | POA: Diagnosis not present

## 2020-09-05 DIAGNOSIS — Z992 Dependence on renal dialysis: Secondary | ICD-10-CM | POA: Diagnosis not present

## 2020-09-05 DIAGNOSIS — E1129 Type 2 diabetes mellitus with other diabetic kidney complication: Secondary | ICD-10-CM | POA: Diagnosis not present

## 2020-09-05 DIAGNOSIS — E785 Hyperlipidemia, unspecified: Secondary | ICD-10-CM | POA: Diagnosis not present

## 2020-09-05 DIAGNOSIS — Z4932 Encounter for adequacy testing for peritoneal dialysis: Secondary | ICD-10-CM | POA: Diagnosis not present

## 2020-09-05 DIAGNOSIS — N2581 Secondary hyperparathyroidism of renal origin: Secondary | ICD-10-CM | POA: Diagnosis not present

## 2020-09-05 DIAGNOSIS — N186 End stage renal disease: Secondary | ICD-10-CM | POA: Diagnosis not present

## 2020-09-06 DIAGNOSIS — N2581 Secondary hyperparathyroidism of renal origin: Secondary | ICD-10-CM | POA: Diagnosis not present

## 2020-09-06 DIAGNOSIS — D631 Anemia in chronic kidney disease: Secondary | ICD-10-CM | POA: Diagnosis not present

## 2020-09-06 DIAGNOSIS — Z992 Dependence on renal dialysis: Secondary | ICD-10-CM | POA: Diagnosis not present

## 2020-09-06 DIAGNOSIS — E785 Hyperlipidemia, unspecified: Secondary | ICD-10-CM | POA: Diagnosis not present

## 2020-09-06 DIAGNOSIS — E1129 Type 2 diabetes mellitus with other diabetic kidney complication: Secondary | ICD-10-CM | POA: Diagnosis not present

## 2020-09-06 DIAGNOSIS — N186 End stage renal disease: Secondary | ICD-10-CM | POA: Diagnosis not present

## 2020-09-06 DIAGNOSIS — Z4932 Encounter for adequacy testing for peritoneal dialysis: Secondary | ICD-10-CM | POA: Diagnosis not present

## 2020-09-07 DIAGNOSIS — E1129 Type 2 diabetes mellitus with other diabetic kidney complication: Secondary | ICD-10-CM | POA: Diagnosis not present

## 2020-09-07 DIAGNOSIS — D631 Anemia in chronic kidney disease: Secondary | ICD-10-CM | POA: Diagnosis not present

## 2020-09-07 DIAGNOSIS — Z992 Dependence on renal dialysis: Secondary | ICD-10-CM | POA: Diagnosis not present

## 2020-09-07 DIAGNOSIS — N2581 Secondary hyperparathyroidism of renal origin: Secondary | ICD-10-CM | POA: Diagnosis not present

## 2020-09-07 DIAGNOSIS — E785 Hyperlipidemia, unspecified: Secondary | ICD-10-CM | POA: Diagnosis not present

## 2020-09-07 DIAGNOSIS — N186 End stage renal disease: Secondary | ICD-10-CM | POA: Diagnosis not present

## 2020-09-07 DIAGNOSIS — Z4932 Encounter for adequacy testing for peritoneal dialysis: Secondary | ICD-10-CM | POA: Diagnosis not present

## 2020-09-08 DIAGNOSIS — N2581 Secondary hyperparathyroidism of renal origin: Secondary | ICD-10-CM | POA: Diagnosis not present

## 2020-09-08 DIAGNOSIS — Z4932 Encounter for adequacy testing for peritoneal dialysis: Secondary | ICD-10-CM | POA: Diagnosis not present

## 2020-09-08 DIAGNOSIS — E1129 Type 2 diabetes mellitus with other diabetic kidney complication: Secondary | ICD-10-CM | POA: Diagnosis not present

## 2020-09-08 DIAGNOSIS — E785 Hyperlipidemia, unspecified: Secondary | ICD-10-CM | POA: Diagnosis not present

## 2020-09-08 DIAGNOSIS — D631 Anemia in chronic kidney disease: Secondary | ICD-10-CM | POA: Diagnosis not present

## 2020-09-08 DIAGNOSIS — Z992 Dependence on renal dialysis: Secondary | ICD-10-CM | POA: Diagnosis not present

## 2020-09-08 DIAGNOSIS — N186 End stage renal disease: Secondary | ICD-10-CM | POA: Diagnosis not present

## 2020-09-09 DIAGNOSIS — Z992 Dependence on renal dialysis: Secondary | ICD-10-CM | POA: Diagnosis not present

## 2020-09-09 DIAGNOSIS — E785 Hyperlipidemia, unspecified: Secondary | ICD-10-CM | POA: Diagnosis not present

## 2020-09-09 DIAGNOSIS — D631 Anemia in chronic kidney disease: Secondary | ICD-10-CM | POA: Diagnosis not present

## 2020-09-09 DIAGNOSIS — E1129 Type 2 diabetes mellitus with other diabetic kidney complication: Secondary | ICD-10-CM | POA: Diagnosis not present

## 2020-09-09 DIAGNOSIS — N2581 Secondary hyperparathyroidism of renal origin: Secondary | ICD-10-CM | POA: Diagnosis not present

## 2020-09-09 DIAGNOSIS — N186 End stage renal disease: Secondary | ICD-10-CM | POA: Diagnosis not present

## 2020-09-09 DIAGNOSIS — Z4932 Encounter for adequacy testing for peritoneal dialysis: Secondary | ICD-10-CM | POA: Diagnosis not present

## 2020-09-10 DIAGNOSIS — D631 Anemia in chronic kidney disease: Secondary | ICD-10-CM | POA: Diagnosis not present

## 2020-09-10 DIAGNOSIS — E785 Hyperlipidemia, unspecified: Secondary | ICD-10-CM | POA: Diagnosis not present

## 2020-09-10 DIAGNOSIS — N186 End stage renal disease: Secondary | ICD-10-CM | POA: Diagnosis not present

## 2020-09-10 DIAGNOSIS — Z992 Dependence on renal dialysis: Secondary | ICD-10-CM | POA: Diagnosis not present

## 2020-09-10 DIAGNOSIS — E1129 Type 2 diabetes mellitus with other diabetic kidney complication: Secondary | ICD-10-CM | POA: Diagnosis not present

## 2020-09-10 DIAGNOSIS — N2581 Secondary hyperparathyroidism of renal origin: Secondary | ICD-10-CM | POA: Diagnosis not present

## 2020-09-10 DIAGNOSIS — Z4932 Encounter for adequacy testing for peritoneal dialysis: Secondary | ICD-10-CM | POA: Diagnosis not present

## 2020-09-11 DIAGNOSIS — E785 Hyperlipidemia, unspecified: Secondary | ICD-10-CM | POA: Diagnosis not present

## 2020-09-11 DIAGNOSIS — E1129 Type 2 diabetes mellitus with other diabetic kidney complication: Secondary | ICD-10-CM | POA: Diagnosis not present

## 2020-09-11 DIAGNOSIS — Z4932 Encounter for adequacy testing for peritoneal dialysis: Secondary | ICD-10-CM | POA: Diagnosis not present

## 2020-09-11 DIAGNOSIS — D631 Anemia in chronic kidney disease: Secondary | ICD-10-CM | POA: Diagnosis not present

## 2020-09-11 DIAGNOSIS — N2581 Secondary hyperparathyroidism of renal origin: Secondary | ICD-10-CM | POA: Diagnosis not present

## 2020-09-11 DIAGNOSIS — N186 End stage renal disease: Secondary | ICD-10-CM | POA: Diagnosis not present

## 2020-09-11 DIAGNOSIS — Z992 Dependence on renal dialysis: Secondary | ICD-10-CM | POA: Diagnosis not present

## 2020-09-12 ENCOUNTER — Other Ambulatory Visit: Payer: Self-pay | Admitting: Family Medicine

## 2020-09-12 DIAGNOSIS — Z992 Dependence on renal dialysis: Secondary | ICD-10-CM | POA: Diagnosis not present

## 2020-09-12 DIAGNOSIS — E785 Hyperlipidemia, unspecified: Secondary | ICD-10-CM | POA: Diagnosis not present

## 2020-09-12 DIAGNOSIS — D631 Anemia in chronic kidney disease: Secondary | ICD-10-CM | POA: Diagnosis not present

## 2020-09-12 DIAGNOSIS — E1129 Type 2 diabetes mellitus with other diabetic kidney complication: Secondary | ICD-10-CM | POA: Diagnosis not present

## 2020-09-12 DIAGNOSIS — E1122 Type 2 diabetes mellitus with diabetic chronic kidney disease: Secondary | ICD-10-CM | POA: Diagnosis not present

## 2020-09-12 DIAGNOSIS — N2581 Secondary hyperparathyroidism of renal origin: Secondary | ICD-10-CM | POA: Diagnosis not present

## 2020-09-12 DIAGNOSIS — N186 End stage renal disease: Secondary | ICD-10-CM | POA: Diagnosis not present

## 2020-09-12 DIAGNOSIS — R062 Wheezing: Secondary | ICD-10-CM

## 2020-09-12 DIAGNOSIS — Z4932 Encounter for adequacy testing for peritoneal dialysis: Secondary | ICD-10-CM | POA: Diagnosis not present

## 2020-09-12 DIAGNOSIS — Z8709 Personal history of other diseases of the respiratory system: Secondary | ICD-10-CM

## 2020-09-13 DIAGNOSIS — E785 Hyperlipidemia, unspecified: Secondary | ICD-10-CM | POA: Diagnosis not present

## 2020-09-13 DIAGNOSIS — N2581 Secondary hyperparathyroidism of renal origin: Secondary | ICD-10-CM | POA: Diagnosis not present

## 2020-09-13 DIAGNOSIS — Z4932 Encounter for adequacy testing for peritoneal dialysis: Secondary | ICD-10-CM | POA: Diagnosis not present

## 2020-09-13 DIAGNOSIS — N186 End stage renal disease: Secondary | ICD-10-CM | POA: Diagnosis not present

## 2020-09-13 DIAGNOSIS — Z992 Dependence on renal dialysis: Secondary | ICD-10-CM | POA: Diagnosis not present

## 2020-09-13 DIAGNOSIS — D631 Anemia in chronic kidney disease: Secondary | ICD-10-CM | POA: Diagnosis not present

## 2020-09-14 DIAGNOSIS — N2581 Secondary hyperparathyroidism of renal origin: Secondary | ICD-10-CM | POA: Diagnosis not present

## 2020-09-14 DIAGNOSIS — N186 End stage renal disease: Secondary | ICD-10-CM | POA: Diagnosis not present

## 2020-09-14 DIAGNOSIS — E785 Hyperlipidemia, unspecified: Secondary | ICD-10-CM | POA: Diagnosis not present

## 2020-09-14 DIAGNOSIS — D631 Anemia in chronic kidney disease: Secondary | ICD-10-CM | POA: Diagnosis not present

## 2020-09-14 DIAGNOSIS — Z4932 Encounter for adequacy testing for peritoneal dialysis: Secondary | ICD-10-CM | POA: Diagnosis not present

## 2020-09-14 DIAGNOSIS — Z992 Dependence on renal dialysis: Secondary | ICD-10-CM | POA: Diagnosis not present

## 2020-09-14 NOTE — Telephone Encounter (Signed)
Ok to refill 

## 2020-09-15 DIAGNOSIS — D631 Anemia in chronic kidney disease: Secondary | ICD-10-CM | POA: Diagnosis not present

## 2020-09-15 DIAGNOSIS — N186 End stage renal disease: Secondary | ICD-10-CM | POA: Diagnosis not present

## 2020-09-15 DIAGNOSIS — E785 Hyperlipidemia, unspecified: Secondary | ICD-10-CM | POA: Diagnosis not present

## 2020-09-15 DIAGNOSIS — Z992 Dependence on renal dialysis: Secondary | ICD-10-CM | POA: Diagnosis not present

## 2020-09-15 DIAGNOSIS — Z4932 Encounter for adequacy testing for peritoneal dialysis: Secondary | ICD-10-CM | POA: Diagnosis not present

## 2020-09-15 DIAGNOSIS — N2581 Secondary hyperparathyroidism of renal origin: Secondary | ICD-10-CM | POA: Diagnosis not present

## 2020-09-16 DIAGNOSIS — E785 Hyperlipidemia, unspecified: Secondary | ICD-10-CM | POA: Diagnosis not present

## 2020-09-16 DIAGNOSIS — D631 Anemia in chronic kidney disease: Secondary | ICD-10-CM | POA: Diagnosis not present

## 2020-09-16 DIAGNOSIS — N2581 Secondary hyperparathyroidism of renal origin: Secondary | ICD-10-CM | POA: Diagnosis not present

## 2020-09-16 DIAGNOSIS — N186 End stage renal disease: Secondary | ICD-10-CM | POA: Diagnosis not present

## 2020-09-16 DIAGNOSIS — Z992 Dependence on renal dialysis: Secondary | ICD-10-CM | POA: Diagnosis not present

## 2020-09-16 DIAGNOSIS — Z4932 Encounter for adequacy testing for peritoneal dialysis: Secondary | ICD-10-CM | POA: Diagnosis not present

## 2020-09-17 DIAGNOSIS — N2581 Secondary hyperparathyroidism of renal origin: Secondary | ICD-10-CM | POA: Diagnosis not present

## 2020-09-17 DIAGNOSIS — D631 Anemia in chronic kidney disease: Secondary | ICD-10-CM | POA: Diagnosis not present

## 2020-09-17 DIAGNOSIS — Z4932 Encounter for adequacy testing for peritoneal dialysis: Secondary | ICD-10-CM | POA: Diagnosis not present

## 2020-09-17 DIAGNOSIS — E785 Hyperlipidemia, unspecified: Secondary | ICD-10-CM | POA: Diagnosis not present

## 2020-09-17 DIAGNOSIS — Z992 Dependence on renal dialysis: Secondary | ICD-10-CM | POA: Diagnosis not present

## 2020-09-17 DIAGNOSIS — N186 End stage renal disease: Secondary | ICD-10-CM | POA: Diagnosis not present

## 2020-09-18 DIAGNOSIS — Z992 Dependence on renal dialysis: Secondary | ICD-10-CM | POA: Diagnosis not present

## 2020-09-18 DIAGNOSIS — N2581 Secondary hyperparathyroidism of renal origin: Secondary | ICD-10-CM | POA: Diagnosis not present

## 2020-09-18 DIAGNOSIS — N186 End stage renal disease: Secondary | ICD-10-CM | POA: Diagnosis not present

## 2020-09-18 DIAGNOSIS — Z4932 Encounter for adequacy testing for peritoneal dialysis: Secondary | ICD-10-CM | POA: Diagnosis not present

## 2020-09-18 DIAGNOSIS — E785 Hyperlipidemia, unspecified: Secondary | ICD-10-CM | POA: Diagnosis not present

## 2020-09-18 DIAGNOSIS — D631 Anemia in chronic kidney disease: Secondary | ICD-10-CM | POA: Diagnosis not present

## 2020-09-19 DIAGNOSIS — N186 End stage renal disease: Secondary | ICD-10-CM | POA: Diagnosis not present

## 2020-09-19 DIAGNOSIS — Z4932 Encounter for adequacy testing for peritoneal dialysis: Secondary | ICD-10-CM | POA: Diagnosis not present

## 2020-09-19 DIAGNOSIS — N2581 Secondary hyperparathyroidism of renal origin: Secondary | ICD-10-CM | POA: Diagnosis not present

## 2020-09-19 DIAGNOSIS — Z992 Dependence on renal dialysis: Secondary | ICD-10-CM | POA: Diagnosis not present

## 2020-09-19 DIAGNOSIS — E785 Hyperlipidemia, unspecified: Secondary | ICD-10-CM | POA: Diagnosis not present

## 2020-09-19 DIAGNOSIS — D631 Anemia in chronic kidney disease: Secondary | ICD-10-CM | POA: Diagnosis not present

## 2020-09-20 DIAGNOSIS — E785 Hyperlipidemia, unspecified: Secondary | ICD-10-CM | POA: Diagnosis not present

## 2020-09-20 DIAGNOSIS — N2581 Secondary hyperparathyroidism of renal origin: Secondary | ICD-10-CM | POA: Diagnosis not present

## 2020-09-20 DIAGNOSIS — Z4932 Encounter for adequacy testing for peritoneal dialysis: Secondary | ICD-10-CM | POA: Diagnosis not present

## 2020-09-20 DIAGNOSIS — D631 Anemia in chronic kidney disease: Secondary | ICD-10-CM | POA: Diagnosis not present

## 2020-09-20 DIAGNOSIS — Z992 Dependence on renal dialysis: Secondary | ICD-10-CM | POA: Diagnosis not present

## 2020-09-20 DIAGNOSIS — N186 End stage renal disease: Secondary | ICD-10-CM | POA: Diagnosis not present

## 2020-09-21 DIAGNOSIS — N2581 Secondary hyperparathyroidism of renal origin: Secondary | ICD-10-CM | POA: Diagnosis not present

## 2020-09-21 DIAGNOSIS — N186 End stage renal disease: Secondary | ICD-10-CM | POA: Diagnosis not present

## 2020-09-21 DIAGNOSIS — E785 Hyperlipidemia, unspecified: Secondary | ICD-10-CM | POA: Diagnosis not present

## 2020-09-21 DIAGNOSIS — Z4932 Encounter for adequacy testing for peritoneal dialysis: Secondary | ICD-10-CM | POA: Diagnosis not present

## 2020-09-21 DIAGNOSIS — Z992 Dependence on renal dialysis: Secondary | ICD-10-CM | POA: Diagnosis not present

## 2020-09-21 DIAGNOSIS — D631 Anemia in chronic kidney disease: Secondary | ICD-10-CM | POA: Diagnosis not present

## 2020-09-22 DIAGNOSIS — Z992 Dependence on renal dialysis: Secondary | ICD-10-CM | POA: Diagnosis not present

## 2020-09-22 DIAGNOSIS — D631 Anemia in chronic kidney disease: Secondary | ICD-10-CM | POA: Diagnosis not present

## 2020-09-22 DIAGNOSIS — Z4932 Encounter for adequacy testing for peritoneal dialysis: Secondary | ICD-10-CM | POA: Diagnosis not present

## 2020-09-22 DIAGNOSIS — N186 End stage renal disease: Secondary | ICD-10-CM | POA: Diagnosis not present

## 2020-09-22 DIAGNOSIS — N2581 Secondary hyperparathyroidism of renal origin: Secondary | ICD-10-CM | POA: Diagnosis not present

## 2020-09-22 DIAGNOSIS — E785 Hyperlipidemia, unspecified: Secondary | ICD-10-CM | POA: Diagnosis not present

## 2020-09-23 DIAGNOSIS — Z992 Dependence on renal dialysis: Secondary | ICD-10-CM | POA: Diagnosis not present

## 2020-09-23 DIAGNOSIS — N186 End stage renal disease: Secondary | ICD-10-CM | POA: Diagnosis not present

## 2020-09-23 DIAGNOSIS — D631 Anemia in chronic kidney disease: Secondary | ICD-10-CM | POA: Diagnosis not present

## 2020-09-23 DIAGNOSIS — E785 Hyperlipidemia, unspecified: Secondary | ICD-10-CM | POA: Diagnosis not present

## 2020-09-23 DIAGNOSIS — N2581 Secondary hyperparathyroidism of renal origin: Secondary | ICD-10-CM | POA: Diagnosis not present

## 2020-09-23 DIAGNOSIS — Z4932 Encounter for adequacy testing for peritoneal dialysis: Secondary | ICD-10-CM | POA: Diagnosis not present

## 2020-09-24 DIAGNOSIS — Z992 Dependence on renal dialysis: Secondary | ICD-10-CM | POA: Diagnosis not present

## 2020-09-24 DIAGNOSIS — D631 Anemia in chronic kidney disease: Secondary | ICD-10-CM | POA: Diagnosis not present

## 2020-09-24 DIAGNOSIS — E785 Hyperlipidemia, unspecified: Secondary | ICD-10-CM | POA: Diagnosis not present

## 2020-09-24 DIAGNOSIS — Z4932 Encounter for adequacy testing for peritoneal dialysis: Secondary | ICD-10-CM | POA: Diagnosis not present

## 2020-09-24 DIAGNOSIS — N2581 Secondary hyperparathyroidism of renal origin: Secondary | ICD-10-CM | POA: Diagnosis not present

## 2020-09-24 DIAGNOSIS — N186 End stage renal disease: Secondary | ICD-10-CM | POA: Diagnosis not present

## 2020-09-25 DIAGNOSIS — E785 Hyperlipidemia, unspecified: Secondary | ICD-10-CM | POA: Diagnosis not present

## 2020-09-25 DIAGNOSIS — N2581 Secondary hyperparathyroidism of renal origin: Secondary | ICD-10-CM | POA: Diagnosis not present

## 2020-09-25 DIAGNOSIS — N186 End stage renal disease: Secondary | ICD-10-CM | POA: Diagnosis not present

## 2020-09-25 DIAGNOSIS — D631 Anemia in chronic kidney disease: Secondary | ICD-10-CM | POA: Diagnosis not present

## 2020-09-25 DIAGNOSIS — Z992 Dependence on renal dialysis: Secondary | ICD-10-CM | POA: Diagnosis not present

## 2020-09-25 DIAGNOSIS — Z4932 Encounter for adequacy testing for peritoneal dialysis: Secondary | ICD-10-CM | POA: Diagnosis not present

## 2020-09-26 DIAGNOSIS — D631 Anemia in chronic kidney disease: Secondary | ICD-10-CM | POA: Diagnosis not present

## 2020-09-26 DIAGNOSIS — N186 End stage renal disease: Secondary | ICD-10-CM | POA: Diagnosis not present

## 2020-09-26 DIAGNOSIS — Z4932 Encounter for adequacy testing for peritoneal dialysis: Secondary | ICD-10-CM | POA: Diagnosis not present

## 2020-09-26 DIAGNOSIS — Z992 Dependence on renal dialysis: Secondary | ICD-10-CM | POA: Diagnosis not present

## 2020-09-26 DIAGNOSIS — N2581 Secondary hyperparathyroidism of renal origin: Secondary | ICD-10-CM | POA: Diagnosis not present

## 2020-09-26 DIAGNOSIS — E785 Hyperlipidemia, unspecified: Secondary | ICD-10-CM | POA: Diagnosis not present

## 2020-09-27 DIAGNOSIS — N2581 Secondary hyperparathyroidism of renal origin: Secondary | ICD-10-CM | POA: Diagnosis not present

## 2020-09-27 DIAGNOSIS — E785 Hyperlipidemia, unspecified: Secondary | ICD-10-CM | POA: Diagnosis not present

## 2020-09-27 DIAGNOSIS — N186 End stage renal disease: Secondary | ICD-10-CM | POA: Diagnosis not present

## 2020-09-27 DIAGNOSIS — D631 Anemia in chronic kidney disease: Secondary | ICD-10-CM | POA: Diagnosis not present

## 2020-09-27 DIAGNOSIS — Z4932 Encounter for adequacy testing for peritoneal dialysis: Secondary | ICD-10-CM | POA: Diagnosis not present

## 2020-09-27 DIAGNOSIS — Z992 Dependence on renal dialysis: Secondary | ICD-10-CM | POA: Diagnosis not present

## 2020-09-28 DIAGNOSIS — D631 Anemia in chronic kidney disease: Secondary | ICD-10-CM | POA: Diagnosis not present

## 2020-09-28 DIAGNOSIS — N2581 Secondary hyperparathyroidism of renal origin: Secondary | ICD-10-CM | POA: Diagnosis not present

## 2020-09-28 DIAGNOSIS — Z992 Dependence on renal dialysis: Secondary | ICD-10-CM | POA: Diagnosis not present

## 2020-09-28 DIAGNOSIS — Z4932 Encounter for adequacy testing for peritoneal dialysis: Secondary | ICD-10-CM | POA: Diagnosis not present

## 2020-09-28 DIAGNOSIS — N186 End stage renal disease: Secondary | ICD-10-CM | POA: Diagnosis not present

## 2020-09-28 DIAGNOSIS — E785 Hyperlipidemia, unspecified: Secondary | ICD-10-CM | POA: Diagnosis not present

## 2020-09-29 DIAGNOSIS — E785 Hyperlipidemia, unspecified: Secondary | ICD-10-CM | POA: Diagnosis not present

## 2020-09-29 DIAGNOSIS — Z992 Dependence on renal dialysis: Secondary | ICD-10-CM | POA: Diagnosis not present

## 2020-09-29 DIAGNOSIS — N186 End stage renal disease: Secondary | ICD-10-CM | POA: Diagnosis not present

## 2020-09-29 DIAGNOSIS — D631 Anemia in chronic kidney disease: Secondary | ICD-10-CM | POA: Diagnosis not present

## 2020-09-29 DIAGNOSIS — N2581 Secondary hyperparathyroidism of renal origin: Secondary | ICD-10-CM | POA: Diagnosis not present

## 2020-09-29 DIAGNOSIS — Z4932 Encounter for adequacy testing for peritoneal dialysis: Secondary | ICD-10-CM | POA: Diagnosis not present

## 2020-09-30 DIAGNOSIS — Z4932 Encounter for adequacy testing for peritoneal dialysis: Secondary | ICD-10-CM | POA: Diagnosis not present

## 2020-09-30 DIAGNOSIS — D631 Anemia in chronic kidney disease: Secondary | ICD-10-CM | POA: Diagnosis not present

## 2020-09-30 DIAGNOSIS — N186 End stage renal disease: Secondary | ICD-10-CM | POA: Diagnosis not present

## 2020-09-30 DIAGNOSIS — E785 Hyperlipidemia, unspecified: Secondary | ICD-10-CM | POA: Diagnosis not present

## 2020-09-30 DIAGNOSIS — Z992 Dependence on renal dialysis: Secondary | ICD-10-CM | POA: Diagnosis not present

## 2020-09-30 DIAGNOSIS — N2581 Secondary hyperparathyroidism of renal origin: Secondary | ICD-10-CM | POA: Diagnosis not present

## 2020-10-01 DIAGNOSIS — Z4932 Encounter for adequacy testing for peritoneal dialysis: Secondary | ICD-10-CM | POA: Diagnosis not present

## 2020-10-01 DIAGNOSIS — N186 End stage renal disease: Secondary | ICD-10-CM | POA: Diagnosis not present

## 2020-10-01 DIAGNOSIS — D631 Anemia in chronic kidney disease: Secondary | ICD-10-CM | POA: Diagnosis not present

## 2020-10-01 DIAGNOSIS — N2581 Secondary hyperparathyroidism of renal origin: Secondary | ICD-10-CM | POA: Diagnosis not present

## 2020-10-01 DIAGNOSIS — E785 Hyperlipidemia, unspecified: Secondary | ICD-10-CM | POA: Diagnosis not present

## 2020-10-01 DIAGNOSIS — Z992 Dependence on renal dialysis: Secondary | ICD-10-CM | POA: Diagnosis not present

## 2020-10-02 DIAGNOSIS — Z4932 Encounter for adequacy testing for peritoneal dialysis: Secondary | ICD-10-CM | POA: Diagnosis not present

## 2020-10-02 DIAGNOSIS — D631 Anemia in chronic kidney disease: Secondary | ICD-10-CM | POA: Diagnosis not present

## 2020-10-02 DIAGNOSIS — Z992 Dependence on renal dialysis: Secondary | ICD-10-CM | POA: Diagnosis not present

## 2020-10-02 DIAGNOSIS — E785 Hyperlipidemia, unspecified: Secondary | ICD-10-CM | POA: Diagnosis not present

## 2020-10-02 DIAGNOSIS — N186 End stage renal disease: Secondary | ICD-10-CM | POA: Diagnosis not present

## 2020-10-02 DIAGNOSIS — N2581 Secondary hyperparathyroidism of renal origin: Secondary | ICD-10-CM | POA: Diagnosis not present

## 2020-10-03 DIAGNOSIS — Z992 Dependence on renal dialysis: Secondary | ICD-10-CM | POA: Diagnosis not present

## 2020-10-03 DIAGNOSIS — Z4932 Encounter for adequacy testing for peritoneal dialysis: Secondary | ICD-10-CM | POA: Diagnosis not present

## 2020-10-03 DIAGNOSIS — N2581 Secondary hyperparathyroidism of renal origin: Secondary | ICD-10-CM | POA: Diagnosis not present

## 2020-10-03 DIAGNOSIS — D631 Anemia in chronic kidney disease: Secondary | ICD-10-CM | POA: Diagnosis not present

## 2020-10-03 DIAGNOSIS — N186 End stage renal disease: Secondary | ICD-10-CM | POA: Diagnosis not present

## 2020-10-03 DIAGNOSIS — E785 Hyperlipidemia, unspecified: Secondary | ICD-10-CM | POA: Diagnosis not present

## 2020-10-04 DIAGNOSIS — N186 End stage renal disease: Secondary | ICD-10-CM | POA: Diagnosis not present

## 2020-10-04 DIAGNOSIS — Z4932 Encounter for adequacy testing for peritoneal dialysis: Secondary | ICD-10-CM | POA: Diagnosis not present

## 2020-10-04 DIAGNOSIS — D631 Anemia in chronic kidney disease: Secondary | ICD-10-CM | POA: Diagnosis not present

## 2020-10-04 DIAGNOSIS — E785 Hyperlipidemia, unspecified: Secondary | ICD-10-CM | POA: Diagnosis not present

## 2020-10-04 DIAGNOSIS — Z992 Dependence on renal dialysis: Secondary | ICD-10-CM | POA: Diagnosis not present

## 2020-10-04 DIAGNOSIS — N2581 Secondary hyperparathyroidism of renal origin: Secondary | ICD-10-CM | POA: Diagnosis not present

## 2020-10-05 DIAGNOSIS — D631 Anemia in chronic kidney disease: Secondary | ICD-10-CM | POA: Diagnosis not present

## 2020-10-05 DIAGNOSIS — Z992 Dependence on renal dialysis: Secondary | ICD-10-CM | POA: Diagnosis not present

## 2020-10-05 DIAGNOSIS — N2581 Secondary hyperparathyroidism of renal origin: Secondary | ICD-10-CM | POA: Diagnosis not present

## 2020-10-05 DIAGNOSIS — N186 End stage renal disease: Secondary | ICD-10-CM | POA: Diagnosis not present

## 2020-10-05 DIAGNOSIS — E785 Hyperlipidemia, unspecified: Secondary | ICD-10-CM | POA: Diagnosis not present

## 2020-10-05 DIAGNOSIS — Z4932 Encounter for adequacy testing for peritoneal dialysis: Secondary | ICD-10-CM | POA: Diagnosis not present

## 2020-10-06 DIAGNOSIS — Z4932 Encounter for adequacy testing for peritoneal dialysis: Secondary | ICD-10-CM | POA: Diagnosis not present

## 2020-10-06 DIAGNOSIS — N2581 Secondary hyperparathyroidism of renal origin: Secondary | ICD-10-CM | POA: Diagnosis not present

## 2020-10-06 DIAGNOSIS — E785 Hyperlipidemia, unspecified: Secondary | ICD-10-CM | POA: Diagnosis not present

## 2020-10-06 DIAGNOSIS — N186 End stage renal disease: Secondary | ICD-10-CM | POA: Diagnosis not present

## 2020-10-06 DIAGNOSIS — Z992 Dependence on renal dialysis: Secondary | ICD-10-CM | POA: Diagnosis not present

## 2020-10-06 DIAGNOSIS — D631 Anemia in chronic kidney disease: Secondary | ICD-10-CM | POA: Diagnosis not present

## 2020-10-07 DIAGNOSIS — Z992 Dependence on renal dialysis: Secondary | ICD-10-CM | POA: Insufficient documentation

## 2020-10-07 DIAGNOSIS — N2581 Secondary hyperparathyroidism of renal origin: Secondary | ICD-10-CM | POA: Diagnosis not present

## 2020-10-07 DIAGNOSIS — Z4932 Encounter for adequacy testing for peritoneal dialysis: Secondary | ICD-10-CM | POA: Diagnosis not present

## 2020-10-07 DIAGNOSIS — D631 Anemia in chronic kidney disease: Secondary | ICD-10-CM | POA: Diagnosis not present

## 2020-10-07 DIAGNOSIS — N186 End stage renal disease: Secondary | ICD-10-CM | POA: Diagnosis not present

## 2020-10-07 DIAGNOSIS — E785 Hyperlipidemia, unspecified: Secondary | ICD-10-CM | POA: Diagnosis not present

## 2020-10-08 DIAGNOSIS — Z4932 Encounter for adequacy testing for peritoneal dialysis: Secondary | ICD-10-CM | POA: Diagnosis not present

## 2020-10-08 DIAGNOSIS — E785 Hyperlipidemia, unspecified: Secondary | ICD-10-CM | POA: Diagnosis not present

## 2020-10-08 DIAGNOSIS — N2581 Secondary hyperparathyroidism of renal origin: Secondary | ICD-10-CM | POA: Diagnosis not present

## 2020-10-08 DIAGNOSIS — N186 End stage renal disease: Secondary | ICD-10-CM | POA: Diagnosis not present

## 2020-10-08 DIAGNOSIS — D631 Anemia in chronic kidney disease: Secondary | ICD-10-CM | POA: Diagnosis not present

## 2020-10-08 DIAGNOSIS — Z992 Dependence on renal dialysis: Secondary | ICD-10-CM | POA: Diagnosis not present

## 2020-10-09 DIAGNOSIS — N2581 Secondary hyperparathyroidism of renal origin: Secondary | ICD-10-CM | POA: Diagnosis not present

## 2020-10-09 DIAGNOSIS — D631 Anemia in chronic kidney disease: Secondary | ICD-10-CM | POA: Diagnosis not present

## 2020-10-09 DIAGNOSIS — N186 End stage renal disease: Secondary | ICD-10-CM | POA: Diagnosis not present

## 2020-10-09 DIAGNOSIS — E785 Hyperlipidemia, unspecified: Secondary | ICD-10-CM | POA: Diagnosis not present

## 2020-10-09 DIAGNOSIS — Z992 Dependence on renal dialysis: Secondary | ICD-10-CM | POA: Diagnosis not present

## 2020-10-09 DIAGNOSIS — Z4932 Encounter for adequacy testing for peritoneal dialysis: Secondary | ICD-10-CM | POA: Diagnosis not present

## 2020-10-10 DIAGNOSIS — Z992 Dependence on renal dialysis: Secondary | ICD-10-CM | POA: Diagnosis not present

## 2020-10-10 DIAGNOSIS — D631 Anemia in chronic kidney disease: Secondary | ICD-10-CM | POA: Diagnosis not present

## 2020-10-10 DIAGNOSIS — E785 Hyperlipidemia, unspecified: Secondary | ICD-10-CM | POA: Diagnosis not present

## 2020-10-10 DIAGNOSIS — N2581 Secondary hyperparathyroidism of renal origin: Secondary | ICD-10-CM | POA: Diagnosis not present

## 2020-10-10 DIAGNOSIS — Z4932 Encounter for adequacy testing for peritoneal dialysis: Secondary | ICD-10-CM | POA: Diagnosis not present

## 2020-10-10 DIAGNOSIS — N186 End stage renal disease: Secondary | ICD-10-CM | POA: Diagnosis not present

## 2020-10-11 DIAGNOSIS — N186 End stage renal disease: Secondary | ICD-10-CM | POA: Diagnosis not present

## 2020-10-11 DIAGNOSIS — N2581 Secondary hyperparathyroidism of renal origin: Secondary | ICD-10-CM | POA: Diagnosis not present

## 2020-10-11 DIAGNOSIS — D631 Anemia in chronic kidney disease: Secondary | ICD-10-CM | POA: Diagnosis not present

## 2020-10-11 DIAGNOSIS — E785 Hyperlipidemia, unspecified: Secondary | ICD-10-CM | POA: Diagnosis not present

## 2020-10-11 DIAGNOSIS — Z992 Dependence on renal dialysis: Secondary | ICD-10-CM | POA: Diagnosis not present

## 2020-10-11 DIAGNOSIS — Z4932 Encounter for adequacy testing for peritoneal dialysis: Secondary | ICD-10-CM | POA: Diagnosis not present

## 2020-10-12 DIAGNOSIS — Z992 Dependence on renal dialysis: Secondary | ICD-10-CM | POA: Diagnosis not present

## 2020-10-12 DIAGNOSIS — D631 Anemia in chronic kidney disease: Secondary | ICD-10-CM | POA: Diagnosis not present

## 2020-10-12 DIAGNOSIS — N186 End stage renal disease: Secondary | ICD-10-CM | POA: Diagnosis not present

## 2020-10-12 DIAGNOSIS — E785 Hyperlipidemia, unspecified: Secondary | ICD-10-CM | POA: Diagnosis not present

## 2020-10-12 DIAGNOSIS — Z4932 Encounter for adequacy testing for peritoneal dialysis: Secondary | ICD-10-CM | POA: Diagnosis not present

## 2020-10-12 DIAGNOSIS — N2581 Secondary hyperparathyroidism of renal origin: Secondary | ICD-10-CM | POA: Diagnosis not present

## 2020-10-13 DIAGNOSIS — Z4932 Encounter for adequacy testing for peritoneal dialysis: Secondary | ICD-10-CM | POA: Diagnosis not present

## 2020-10-13 DIAGNOSIS — N186 End stage renal disease: Secondary | ICD-10-CM | POA: Diagnosis not present

## 2020-10-13 DIAGNOSIS — Z992 Dependence on renal dialysis: Secondary | ICD-10-CM | POA: Diagnosis not present

## 2020-10-13 DIAGNOSIS — N2581 Secondary hyperparathyroidism of renal origin: Secondary | ICD-10-CM | POA: Diagnosis not present

## 2020-10-13 DIAGNOSIS — E1122 Type 2 diabetes mellitus with diabetic chronic kidney disease: Secondary | ICD-10-CM | POA: Diagnosis not present

## 2020-10-13 DIAGNOSIS — E785 Hyperlipidemia, unspecified: Secondary | ICD-10-CM | POA: Diagnosis not present

## 2020-10-13 DIAGNOSIS — D631 Anemia in chronic kidney disease: Secondary | ICD-10-CM | POA: Diagnosis not present

## 2020-10-14 ENCOUNTER — Encounter: Payer: Self-pay | Admitting: Family Medicine

## 2020-10-14 DIAGNOSIS — E785 Hyperlipidemia, unspecified: Secondary | ICD-10-CM | POA: Diagnosis not present

## 2020-10-14 DIAGNOSIS — D631 Anemia in chronic kidney disease: Secondary | ICD-10-CM | POA: Diagnosis not present

## 2020-10-14 DIAGNOSIS — Z992 Dependence on renal dialysis: Secondary | ICD-10-CM | POA: Diagnosis not present

## 2020-10-14 DIAGNOSIS — N186 End stage renal disease: Secondary | ICD-10-CM | POA: Diagnosis not present

## 2020-10-14 DIAGNOSIS — N2581 Secondary hyperparathyroidism of renal origin: Secondary | ICD-10-CM | POA: Diagnosis not present

## 2020-10-15 DIAGNOSIS — E785 Hyperlipidemia, unspecified: Secondary | ICD-10-CM | POA: Diagnosis not present

## 2020-10-15 DIAGNOSIS — D631 Anemia in chronic kidney disease: Secondary | ICD-10-CM | POA: Diagnosis not present

## 2020-10-15 DIAGNOSIS — Z992 Dependence on renal dialysis: Secondary | ICD-10-CM | POA: Diagnosis not present

## 2020-10-15 DIAGNOSIS — N2581 Secondary hyperparathyroidism of renal origin: Secondary | ICD-10-CM | POA: Diagnosis not present

## 2020-10-15 DIAGNOSIS — N186 End stage renal disease: Secondary | ICD-10-CM | POA: Diagnosis not present

## 2020-10-16 DIAGNOSIS — N186 End stage renal disease: Secondary | ICD-10-CM | POA: Diagnosis not present

## 2020-10-16 DIAGNOSIS — Z992 Dependence on renal dialysis: Secondary | ICD-10-CM | POA: Diagnosis not present

## 2020-10-16 DIAGNOSIS — E785 Hyperlipidemia, unspecified: Secondary | ICD-10-CM | POA: Diagnosis not present

## 2020-10-16 DIAGNOSIS — D631 Anemia in chronic kidney disease: Secondary | ICD-10-CM | POA: Diagnosis not present

## 2020-10-16 DIAGNOSIS — N2581 Secondary hyperparathyroidism of renal origin: Secondary | ICD-10-CM | POA: Diagnosis not present

## 2020-10-17 DIAGNOSIS — N2581 Secondary hyperparathyroidism of renal origin: Secondary | ICD-10-CM | POA: Diagnosis not present

## 2020-10-17 DIAGNOSIS — Z992 Dependence on renal dialysis: Secondary | ICD-10-CM | POA: Diagnosis not present

## 2020-10-17 DIAGNOSIS — E785 Hyperlipidemia, unspecified: Secondary | ICD-10-CM | POA: Diagnosis not present

## 2020-10-17 DIAGNOSIS — D631 Anemia in chronic kidney disease: Secondary | ICD-10-CM | POA: Diagnosis not present

## 2020-10-17 DIAGNOSIS — N186 End stage renal disease: Secondary | ICD-10-CM | POA: Diagnosis not present

## 2020-10-18 DIAGNOSIS — Z992 Dependence on renal dialysis: Secondary | ICD-10-CM | POA: Diagnosis not present

## 2020-10-18 DIAGNOSIS — E785 Hyperlipidemia, unspecified: Secondary | ICD-10-CM | POA: Diagnosis not present

## 2020-10-18 DIAGNOSIS — N2581 Secondary hyperparathyroidism of renal origin: Secondary | ICD-10-CM | POA: Diagnosis not present

## 2020-10-18 DIAGNOSIS — N186 End stage renal disease: Secondary | ICD-10-CM | POA: Diagnosis not present

## 2020-10-18 DIAGNOSIS — D631 Anemia in chronic kidney disease: Secondary | ICD-10-CM | POA: Diagnosis not present

## 2020-10-19 DIAGNOSIS — N186 End stage renal disease: Secondary | ICD-10-CM | POA: Diagnosis not present

## 2020-10-19 DIAGNOSIS — Z992 Dependence on renal dialysis: Secondary | ICD-10-CM | POA: Diagnosis not present

## 2020-10-19 DIAGNOSIS — D631 Anemia in chronic kidney disease: Secondary | ICD-10-CM | POA: Diagnosis not present

## 2020-10-19 DIAGNOSIS — N2581 Secondary hyperparathyroidism of renal origin: Secondary | ICD-10-CM | POA: Diagnosis not present

## 2020-10-19 DIAGNOSIS — E785 Hyperlipidemia, unspecified: Secondary | ICD-10-CM | POA: Diagnosis not present

## 2020-10-20 DIAGNOSIS — N2581 Secondary hyperparathyroidism of renal origin: Secondary | ICD-10-CM | POA: Diagnosis not present

## 2020-10-20 DIAGNOSIS — D631 Anemia in chronic kidney disease: Secondary | ICD-10-CM | POA: Diagnosis not present

## 2020-10-20 DIAGNOSIS — Z992 Dependence on renal dialysis: Secondary | ICD-10-CM | POA: Diagnosis not present

## 2020-10-20 DIAGNOSIS — N186 End stage renal disease: Secondary | ICD-10-CM | POA: Diagnosis not present

## 2020-10-20 DIAGNOSIS — E785 Hyperlipidemia, unspecified: Secondary | ICD-10-CM | POA: Diagnosis not present

## 2020-10-21 ENCOUNTER — Encounter: Payer: Self-pay | Admitting: Family Medicine

## 2020-10-21 ENCOUNTER — Telehealth (INDEPENDENT_AMBULATORY_CARE_PROVIDER_SITE_OTHER): Payer: Medicare Other | Admitting: Family Medicine

## 2020-10-21 ENCOUNTER — Other Ambulatory Visit: Payer: Self-pay

## 2020-10-21 VITALS — BP 127/82 | HR 84 | Temp 97.9°F | Ht 67.0 in | Wt 300.0 lb

## 2020-10-21 DIAGNOSIS — D631 Anemia in chronic kidney disease: Secondary | ICD-10-CM | POA: Diagnosis not present

## 2020-10-21 DIAGNOSIS — N2581 Secondary hyperparathyroidism of renal origin: Secondary | ICD-10-CM | POA: Diagnosis not present

## 2020-10-21 DIAGNOSIS — E785 Hyperlipidemia, unspecified: Secondary | ICD-10-CM | POA: Diagnosis not present

## 2020-10-21 DIAGNOSIS — I5032 Chronic diastolic (congestive) heart failure: Secondary | ICD-10-CM

## 2020-10-21 DIAGNOSIS — Z992 Dependence on renal dialysis: Secondary | ICD-10-CM | POA: Diagnosis not present

## 2020-10-21 DIAGNOSIS — J4520 Mild intermittent asthma, uncomplicated: Secondary | ICD-10-CM

## 2020-10-21 DIAGNOSIS — N186 End stage renal disease: Secondary | ICD-10-CM | POA: Diagnosis not present

## 2020-10-21 DIAGNOSIS — J309 Allergic rhinitis, unspecified: Secondary | ICD-10-CM

## 2020-10-21 NOTE — Patient Instructions (Signed)
I recommend using Flonase daily--2 sprays into each nostril once daily, gentle sniffs. I recommend taking zyrtec or claritin once daily, overlapping with the flonase for a week. If your symptoms are improved, you can try stopping the claritin, and continue the Flonase.  If symptoms remain well controlled, you can decrease the flonase dose to 1 spray into each nostril.  You can continue to albuterol inhaler as needed for wheezing.  You should be evaluated in person if you have worsening shortness of breath, any chest pain, swelling in your legs.  Heart failure can also cause shortness of breath.  If you develop fever, sinus pain or discolored mucus, then you may need an antibiotic.  Let us know if your symptoms don't improve, or if they worsen.

## 2020-10-21 NOTE — Progress Notes (Signed)
Start time: 3:31 End time: 3:47  Virtual Visit via Video Note  I connected with Adrian Frye on 10/21/20 by a video enabled telemedicine application and verified that I am speaking with the correct person using two identifiers.  Location: Patient: work Provider: office   I discussed the limitations of evaluation and management by telemedicine and the availability of in person appointments. The patient expressed understanding and agreed to proceed.  History of Present Illness:  Chief Complaint  Patient presents with  . Cough    VIRTUAL chest congestion, cough and runny nose. No fever, chills or body aches.    2 months ago he started with runny nose and chest congestion.  His wife noted some wheezing, intermittent. Vickie prescribed an inhaler, which helped.  It helped, using it when needed for wheezing over the last month, but the wheezing kept coming back.  Currently he feels fine, on the phone talking--but with exertion his airways "close down". He got an inhaler from [facility], which helps the wheezing go away, but still has some chest congestion. Symptoms are inconsistent.  He did a home COVID test last week, negative. Symptoms  x2 months, no significant changes.  Mucus is cloudy white, no yellow-green. Denies sinus pain, headaches, no ear pain. +cough, sometimes is dry, sometimes productive, nonpurulent.  Took zyrtec last week, which helped.  PMH, PSH, SH reviewed  Cardiomyopathy, CHF, HTN, on peritoneal dialysis, h/o asthma and allergies  Outpatient Encounter Medications as of 10/21/2020  Medication Sig  . amLODipine (NORVASC) 5 MG tablet Take 1.5 tablets (7.5 mg total) by mouth daily.  . B Complex-C-Folic Acid (DIALYVITE 166 PO) Take 1 tablet by mouth daily.  . calcitRIOL (ROCALTROL) 0.5 MCG capsule Take 0.5 mcg by mouth daily.  Marland Kitchen gentamicin cream (GARAMYCIN) 0.1 % Apply 1 application topically as directed. Port site  . Methoxy PEG-Epoetin Beta (MIRCERA IJ)  Inject into the skin.  Marland Kitchen sevelamer carbonate (RENVELA) 800 MG tablet Take by mouth.  . sildenafil (REVATIO) 20 MG tablet Take 20 mg by mouth 3 (three) times daily.  Marland Kitchen triamcinolone cream (KENALOG) 0.1 % Apply 1 application topically daily as needed (rash).  . zolpidem (AMBIEN CR) 12.5 MG CR tablet Take 1 tablet (12.5 mg total) by mouth at bedtime as needed for sleep.  . [DISCONTINUED] calcium acetate (PHOSLO) 667 MG capsule Take by mouth.  Marland Kitchen albuterol (VENTOLIN HFA) 108 (90 Base) MCG/ACT inhaler TAKE 2 PUFFS BY MOUTH EVERY 6 HOURS AS NEEDED FOR WHEEZE OR SHORTNESS OF BREATH (Patient not taking: Reported on 10/21/2020)  . [DISCONTINUED] Calcium Acetate 667 MG TABS Take by mouth.   No facility-administered encounter medications on file as of 10/21/2020.   Allergies  Allergen Reactions  . Lisinopril Swelling and Anaphylaxis    Angioedema  Other reaction(s): Angioedema (ALLERGY/intolerance)  . Losartan Swelling and Anaphylaxis   ROS: no fever, chills, chest pain, bleeding, rash. +respiratory symptoms per HPI.  No headaches, dizziness or other concerns ,except as noted in HPI    Observations/Objective:  BP 127/82   Pulse 84   Temp 97.9 F (36.6 C) (Temporal)   Ht 5\' 7"  (1.702 m)   Wt 300 lb (136.1 kg)   BMI 46.99 kg/m   Pleasant, well-appearing male, in no distress He is alert, oriented, cranial nerves are grossly intact. He is speaking easily, no coughing Exam is limited due to virtual nature of the visit.  Assessment and Plan:  Allergic rhinitis, unspecified seasonality, unspecified trigger - responded to zyrtec; recommended restart antihistamine.  Recommended Flonase daily, proper instructions reviewed  Mild intermittent asthma, unspecified whether complicated - responding to albuterol, cont prn  Chronic diastolic heart failure ([place]) - having DOE, though seems to respond to albuterol, so suspect asthma rather than CHF. f/u if worsening SOB, edema, other concerns  ESRD on  peritoneal dialysis ([facility]CC)   Antihistamines Nasal steroid sprays--proper directions and use reviewed in detail.  Follow Up Instructions:    I discussed the assessment and treatment plan with the patient. The patient was provided an opportunity to ask questions and all were answered. The patient agreed with the plan and demonstrated an understanding of the instructions.   The patient was advised to call back or seek an in-person evaluation if the symptoms worsen or if the condition fails to improve as anticipated.  I spent 20 minutes dedicated to the care of this patient, including pre-visit review of records, face to face time, post-visit ordering of testing and documentation.    Vikki Ports, MD

## 2020-10-22 DIAGNOSIS — N2581 Secondary hyperparathyroidism of renal origin: Secondary | ICD-10-CM | POA: Diagnosis not present

## 2020-10-22 DIAGNOSIS — E785 Hyperlipidemia, unspecified: Secondary | ICD-10-CM | POA: Diagnosis not present

## 2020-10-22 DIAGNOSIS — Z992 Dependence on renal dialysis: Secondary | ICD-10-CM | POA: Diagnosis not present

## 2020-10-22 DIAGNOSIS — N186 End stage renal disease: Secondary | ICD-10-CM | POA: Diagnosis not present

## 2020-10-22 DIAGNOSIS — D631 Anemia in chronic kidney disease: Secondary | ICD-10-CM | POA: Diagnosis not present

## 2020-10-23 DIAGNOSIS — N186 End stage renal disease: Secondary | ICD-10-CM | POA: Diagnosis not present

## 2020-10-23 DIAGNOSIS — E785 Hyperlipidemia, unspecified: Secondary | ICD-10-CM | POA: Diagnosis not present

## 2020-10-23 DIAGNOSIS — Z992 Dependence on renal dialysis: Secondary | ICD-10-CM | POA: Diagnosis not present

## 2020-10-23 DIAGNOSIS — D631 Anemia in chronic kidney disease: Secondary | ICD-10-CM | POA: Diagnosis not present

## 2020-10-23 DIAGNOSIS — N2581 Secondary hyperparathyroidism of renal origin: Secondary | ICD-10-CM | POA: Diagnosis not present

## 2020-10-24 DIAGNOSIS — N2581 Secondary hyperparathyroidism of renal origin: Secondary | ICD-10-CM | POA: Diagnosis not present

## 2020-10-24 DIAGNOSIS — E785 Hyperlipidemia, unspecified: Secondary | ICD-10-CM | POA: Diagnosis not present

## 2020-10-24 DIAGNOSIS — D631 Anemia in chronic kidney disease: Secondary | ICD-10-CM | POA: Diagnosis not present

## 2020-10-24 DIAGNOSIS — Z992 Dependence on renal dialysis: Secondary | ICD-10-CM | POA: Diagnosis not present

## 2020-10-24 DIAGNOSIS — N186 End stage renal disease: Secondary | ICD-10-CM | POA: Diagnosis not present

## 2020-10-25 DIAGNOSIS — E785 Hyperlipidemia, unspecified: Secondary | ICD-10-CM | POA: Diagnosis not present

## 2020-10-25 DIAGNOSIS — N2581 Secondary hyperparathyroidism of renal origin: Secondary | ICD-10-CM | POA: Diagnosis not present

## 2020-10-25 DIAGNOSIS — N186 End stage renal disease: Secondary | ICD-10-CM | POA: Diagnosis not present

## 2020-10-25 DIAGNOSIS — D631 Anemia in chronic kidney disease: Secondary | ICD-10-CM | POA: Diagnosis not present

## 2020-10-25 DIAGNOSIS — Z992 Dependence on renal dialysis: Secondary | ICD-10-CM | POA: Diagnosis not present

## 2020-10-26 DIAGNOSIS — E785 Hyperlipidemia, unspecified: Secondary | ICD-10-CM | POA: Diagnosis not present

## 2020-10-26 DIAGNOSIS — N2581 Secondary hyperparathyroidism of renal origin: Secondary | ICD-10-CM | POA: Diagnosis not present

## 2020-10-26 DIAGNOSIS — D631 Anemia in chronic kidney disease: Secondary | ICD-10-CM | POA: Diagnosis not present

## 2020-10-26 DIAGNOSIS — N186 End stage renal disease: Secondary | ICD-10-CM | POA: Diagnosis not present

## 2020-10-26 DIAGNOSIS — Z992 Dependence on renal dialysis: Secondary | ICD-10-CM | POA: Diagnosis not present

## 2020-10-27 DIAGNOSIS — D631 Anemia in chronic kidney disease: Secondary | ICD-10-CM | POA: Diagnosis not present

## 2020-10-27 DIAGNOSIS — N186 End stage renal disease: Secondary | ICD-10-CM | POA: Diagnosis not present

## 2020-10-27 DIAGNOSIS — N2581 Secondary hyperparathyroidism of renal origin: Secondary | ICD-10-CM | POA: Diagnosis not present

## 2020-10-27 DIAGNOSIS — E785 Hyperlipidemia, unspecified: Secondary | ICD-10-CM | POA: Diagnosis not present

## 2020-10-27 DIAGNOSIS — Z992 Dependence on renal dialysis: Secondary | ICD-10-CM | POA: Diagnosis not present

## 2020-10-28 DIAGNOSIS — N186 End stage renal disease: Secondary | ICD-10-CM | POA: Diagnosis not present

## 2020-10-28 DIAGNOSIS — Z992 Dependence on renal dialysis: Secondary | ICD-10-CM | POA: Diagnosis not present

## 2020-10-28 DIAGNOSIS — D631 Anemia in chronic kidney disease: Secondary | ICD-10-CM | POA: Diagnosis not present

## 2020-10-28 DIAGNOSIS — E785 Hyperlipidemia, unspecified: Secondary | ICD-10-CM | POA: Diagnosis not present

## 2020-10-28 DIAGNOSIS — N2581 Secondary hyperparathyroidism of renal origin: Secondary | ICD-10-CM | POA: Diagnosis not present

## 2020-10-29 DIAGNOSIS — N2581 Secondary hyperparathyroidism of renal origin: Secondary | ICD-10-CM | POA: Diagnosis not present

## 2020-10-29 DIAGNOSIS — N186 End stage renal disease: Secondary | ICD-10-CM | POA: Diagnosis not present

## 2020-10-29 DIAGNOSIS — E785 Hyperlipidemia, unspecified: Secondary | ICD-10-CM | POA: Diagnosis not present

## 2020-10-29 DIAGNOSIS — D631 Anemia in chronic kidney disease: Secondary | ICD-10-CM | POA: Diagnosis not present

## 2020-10-29 DIAGNOSIS — Z992 Dependence on renal dialysis: Secondary | ICD-10-CM | POA: Diagnosis not present

## 2020-10-30 DIAGNOSIS — E785 Hyperlipidemia, unspecified: Secondary | ICD-10-CM | POA: Diagnosis not present

## 2020-10-30 DIAGNOSIS — Z992 Dependence on renal dialysis: Secondary | ICD-10-CM | POA: Diagnosis not present

## 2020-10-30 DIAGNOSIS — N186 End stage renal disease: Secondary | ICD-10-CM | POA: Diagnosis not present

## 2020-10-30 DIAGNOSIS — N2581 Secondary hyperparathyroidism of renal origin: Secondary | ICD-10-CM | POA: Diagnosis not present

## 2020-10-30 DIAGNOSIS — D631 Anemia in chronic kidney disease: Secondary | ICD-10-CM | POA: Diagnosis not present

## 2020-10-31 DIAGNOSIS — N186 End stage renal disease: Secondary | ICD-10-CM | POA: Diagnosis not present

## 2020-10-31 DIAGNOSIS — D631 Anemia in chronic kidney disease: Secondary | ICD-10-CM | POA: Diagnosis not present

## 2020-10-31 DIAGNOSIS — E785 Hyperlipidemia, unspecified: Secondary | ICD-10-CM | POA: Diagnosis not present

## 2020-10-31 DIAGNOSIS — Z992 Dependence on renal dialysis: Secondary | ICD-10-CM | POA: Diagnosis not present

## 2020-10-31 DIAGNOSIS — N2581 Secondary hyperparathyroidism of renal origin: Secondary | ICD-10-CM | POA: Diagnosis not present

## 2020-11-01 DIAGNOSIS — D631 Anemia in chronic kidney disease: Secondary | ICD-10-CM | POA: Diagnosis not present

## 2020-11-01 DIAGNOSIS — Z992 Dependence on renal dialysis: Secondary | ICD-10-CM | POA: Diagnosis not present

## 2020-11-01 DIAGNOSIS — N186 End stage renal disease: Secondary | ICD-10-CM | POA: Diagnosis not present

## 2020-11-01 DIAGNOSIS — E785 Hyperlipidemia, unspecified: Secondary | ICD-10-CM | POA: Diagnosis not present

## 2020-11-01 DIAGNOSIS — N2581 Secondary hyperparathyroidism of renal origin: Secondary | ICD-10-CM | POA: Diagnosis not present

## 2020-11-02 DIAGNOSIS — Z992 Dependence on renal dialysis: Secondary | ICD-10-CM | POA: Diagnosis not present

## 2020-11-02 DIAGNOSIS — E785 Hyperlipidemia, unspecified: Secondary | ICD-10-CM | POA: Diagnosis not present

## 2020-11-02 DIAGNOSIS — N186 End stage renal disease: Secondary | ICD-10-CM | POA: Diagnosis not present

## 2020-11-02 DIAGNOSIS — D631 Anemia in chronic kidney disease: Secondary | ICD-10-CM | POA: Diagnosis not present

## 2020-11-02 DIAGNOSIS — N2581 Secondary hyperparathyroidism of renal origin: Secondary | ICD-10-CM | POA: Diagnosis not present

## 2020-11-03 DIAGNOSIS — E785 Hyperlipidemia, unspecified: Secondary | ICD-10-CM | POA: Diagnosis not present

## 2020-11-03 DIAGNOSIS — D631 Anemia in chronic kidney disease: Secondary | ICD-10-CM | POA: Diagnosis not present

## 2020-11-03 DIAGNOSIS — N186 End stage renal disease: Secondary | ICD-10-CM | POA: Diagnosis not present

## 2020-11-03 DIAGNOSIS — N2581 Secondary hyperparathyroidism of renal origin: Secondary | ICD-10-CM | POA: Diagnosis not present

## 2020-11-03 DIAGNOSIS — Z992 Dependence on renal dialysis: Secondary | ICD-10-CM | POA: Diagnosis not present

## 2020-11-04 ENCOUNTER — Telehealth: Payer: Self-pay | Admitting: *Deleted

## 2020-11-04 DIAGNOSIS — N2581 Secondary hyperparathyroidism of renal origin: Secondary | ICD-10-CM | POA: Diagnosis not present

## 2020-11-04 DIAGNOSIS — E785 Hyperlipidemia, unspecified: Secondary | ICD-10-CM | POA: Diagnosis not present

## 2020-11-04 DIAGNOSIS — N186 End stage renal disease: Secondary | ICD-10-CM | POA: Diagnosis not present

## 2020-11-04 DIAGNOSIS — D631 Anemia in chronic kidney disease: Secondary | ICD-10-CM | POA: Diagnosis not present

## 2020-11-04 DIAGNOSIS — Z992 Dependence on renal dialysis: Secondary | ICD-10-CM | POA: Diagnosis not present

## 2020-11-04 NOTE — Telephone Encounter (Signed)
Order placed to [facility] to Order ResMed CPAP at 20cm H2O with heated humidity and mask of choice

## 2020-11-04 NOTE — Telephone Encounter (Signed)
-----   Message from Sueanne Margarita, MD sent at 11/01/2020  2:11 PM EDT ----- Regarding: RE: Cpap Rx Order ResMed CPAP at 20cm H2O with heated humidity and mask of choice ----- Message ----- From: Freada Bergeron, CMA Sent: 10/30/2020   1:16 PM EDT To: Sueanne Margarita, MD Subject: Cpap Rx                                        Please write Rx. Please instruct me on how to upgrade my CPAP machine. The machine I have now is over 60 -50 years old.

## 2020-11-05 DIAGNOSIS — N186 End stage renal disease: Secondary | ICD-10-CM | POA: Diagnosis not present

## 2020-11-05 DIAGNOSIS — N2581 Secondary hyperparathyroidism of renal origin: Secondary | ICD-10-CM | POA: Diagnosis not present

## 2020-11-05 DIAGNOSIS — Z992 Dependence on renal dialysis: Secondary | ICD-10-CM | POA: Diagnosis not present

## 2020-11-05 DIAGNOSIS — E785 Hyperlipidemia, unspecified: Secondary | ICD-10-CM | POA: Diagnosis not present

## 2020-11-05 DIAGNOSIS — D631 Anemia in chronic kidney disease: Secondary | ICD-10-CM | POA: Diagnosis not present

## 2020-11-05 IMAGING — US IR TUNNELED CV CATH W/O PORT/PUMP >5YO
1 series · 3 of 3 positions shown · non-contrast
Comparison: none

INDICATION: 49-year-old male with a history of bacteremia. He requires durable
venous access for outpatient IV antibiotic therapy. Additionally, he
has chronic kidney disease and is not a candidate for upper
extremity PICC. He presents for placement of a tunneled IJ central
venous catheter.

[Series 1: ir tunneled cv cath w/o port/pump >5yo · 3 of 3 slices shown]
[im 1/3]
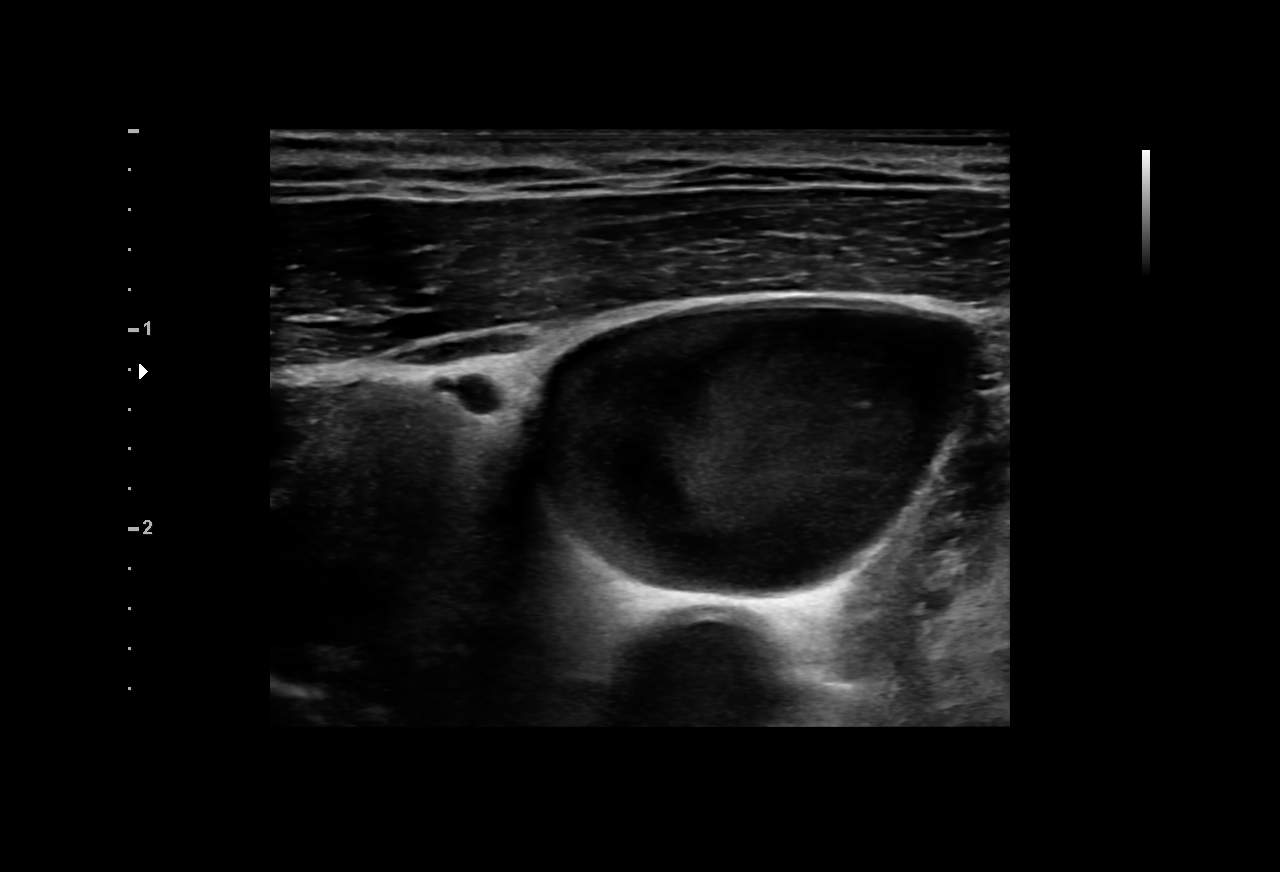
[im 2/3]
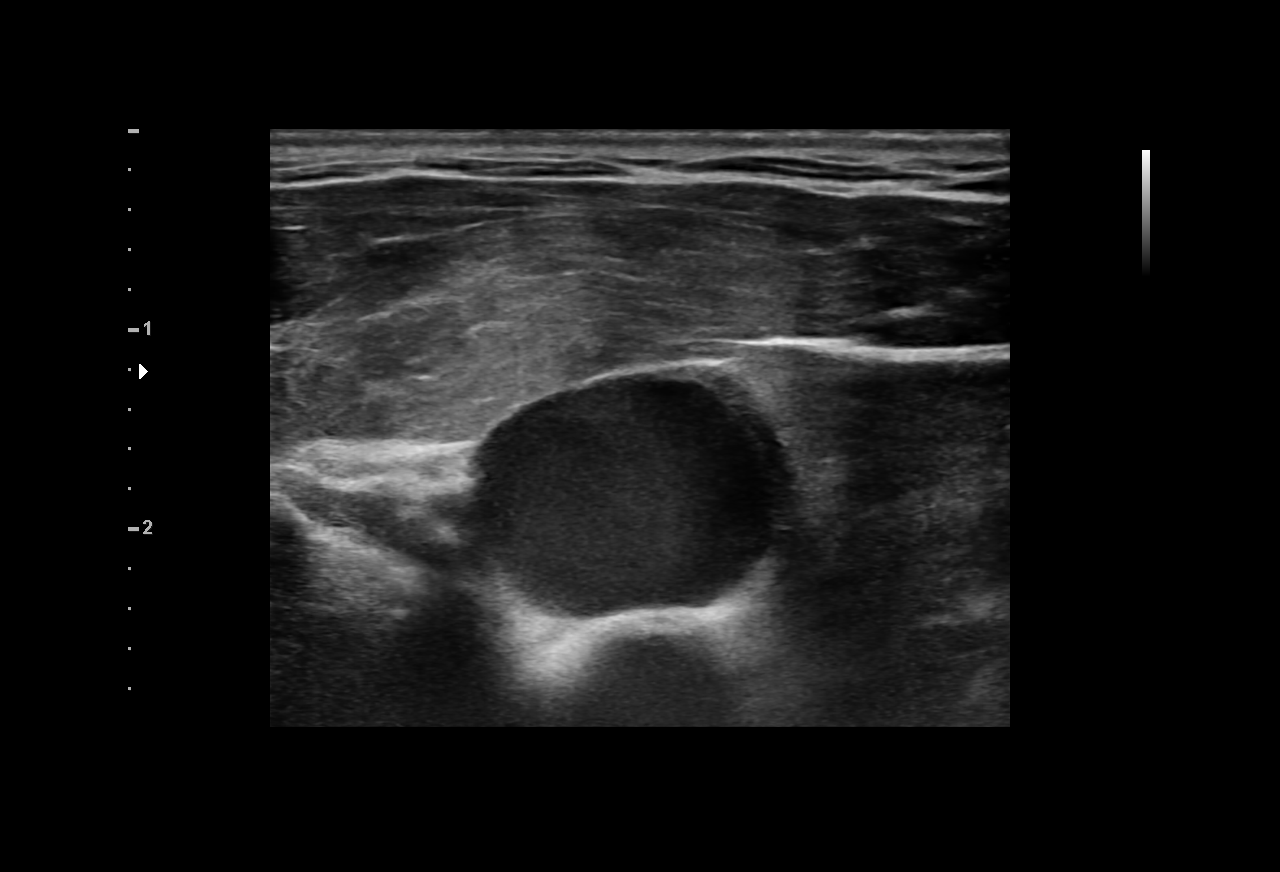
[im 3/3]
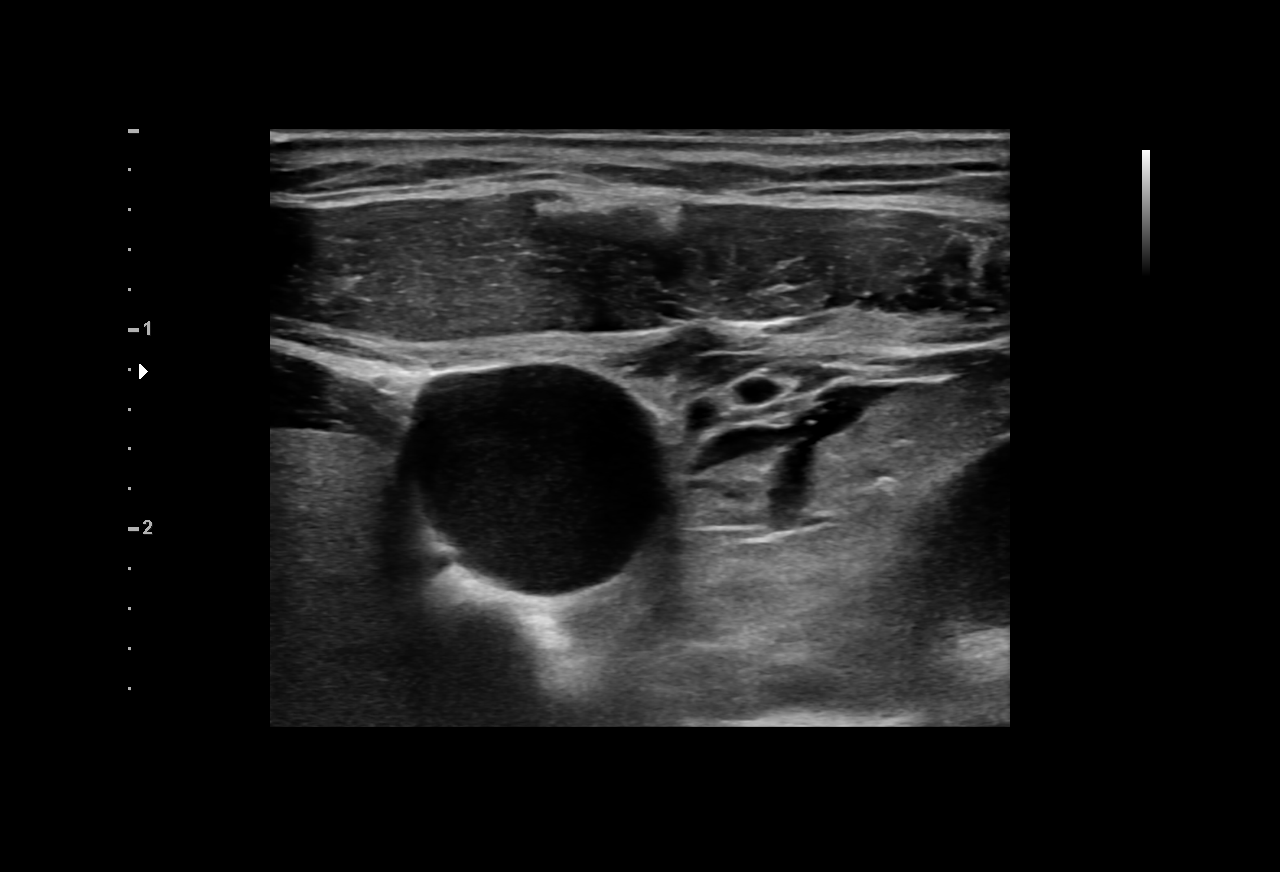

[3 of 3 positions shown; findings below may reference images not displayed]

EXAM:
TUNNELED CV CATH

MEDICATIONS:
None

ANESTHESIA/SEDATION:
None

FLUOROSCOPY TIME:  Fluoroscopy Time: 0 minutes 52 seconds (2 mGy).

COMPLICATIONS:
None immediate.

PROCEDURE:
Informed written consent was obtained from the patient after a
thorough discussion of the procedural risks, benefits and
alternatives. All questions were addressed. Maximal Sterile Barrier
Technique was utilized including caps, mask, sterile gowns, sterile
gloves, sterile drape, hand hygiene and skin antiseptic. A timeout
was performed prior to the initiation of the procedure.

The left internal jugular vein was interrogated with ultrasound and
found to be widely patent. An image was obtained and stored for the
medical record. Local anesthesia was attained by infiltration with
1% lidocaine. A small dermatotomy was made. Under real-time
sonographic guidance, the vessel was punctured with a 21 gauge
micropuncture needle. Using standard technique, the initial micro
needle was exchanged over a 0.018 micro wire for a peel-away sheath.

A suitable skin entry site inferior to the clavicle was selected and
anesthetized with 1% lidocaine. A small dermatotomy was made. A
single lumen power injectable tunneled central venous catheter was
then tunneled from the skin exit site to the dermatotomy overlying
the venous access site. The catheter was cut to length (26 cm) and
advanced through the peel-away sheath. The catheter tip was
positioned fluoroscopically at the superior cavoatrial junction an
image was obtained and stored for the medical record.

The peel-away sheath was removed and discarded. The catheter flushes
and aspirates easily. The catheter was flushed, capped and secured
to the skin with 0 Prolene suture. Sterile bandages were applied.
The dermatotomy at the venous access site was closed with Dermabond.
IMPRESSION: Successful placement of a tunneled left IJ single-lumen power
injectable central venous catheter.

Catheter tip is at the superior cavoatrial junction and the device
is ready for use.

## 2020-11-06 DIAGNOSIS — Z992 Dependence on renal dialysis: Secondary | ICD-10-CM | POA: Diagnosis not present

## 2020-11-06 DIAGNOSIS — N186 End stage renal disease: Secondary | ICD-10-CM | POA: Diagnosis not present

## 2020-11-06 DIAGNOSIS — D631 Anemia in chronic kidney disease: Secondary | ICD-10-CM | POA: Diagnosis not present

## 2020-11-06 DIAGNOSIS — N2581 Secondary hyperparathyroidism of renal origin: Secondary | ICD-10-CM | POA: Diagnosis not present

## 2020-11-06 DIAGNOSIS — E785 Hyperlipidemia, unspecified: Secondary | ICD-10-CM | POA: Diagnosis not present

## 2020-11-07 DIAGNOSIS — N2581 Secondary hyperparathyroidism of renal origin: Secondary | ICD-10-CM | POA: Diagnosis not present

## 2020-11-07 DIAGNOSIS — E785 Hyperlipidemia, unspecified: Secondary | ICD-10-CM | POA: Diagnosis not present

## 2020-11-07 DIAGNOSIS — N186 End stage renal disease: Secondary | ICD-10-CM | POA: Diagnosis not present

## 2020-11-07 DIAGNOSIS — D631 Anemia in chronic kidney disease: Secondary | ICD-10-CM | POA: Diagnosis not present

## 2020-11-07 DIAGNOSIS — Z992 Dependence on renal dialysis: Secondary | ICD-10-CM | POA: Diagnosis not present

## 2020-11-08 DIAGNOSIS — E785 Hyperlipidemia, unspecified: Secondary | ICD-10-CM | POA: Diagnosis not present

## 2020-11-08 DIAGNOSIS — Z992 Dependence on renal dialysis: Secondary | ICD-10-CM | POA: Diagnosis not present

## 2020-11-08 DIAGNOSIS — N2581 Secondary hyperparathyroidism of renal origin: Secondary | ICD-10-CM | POA: Diagnosis not present

## 2020-11-08 DIAGNOSIS — N186 End stage renal disease: Secondary | ICD-10-CM | POA: Diagnosis not present

## 2020-11-08 DIAGNOSIS — D631 Anemia in chronic kidney disease: Secondary | ICD-10-CM | POA: Diagnosis not present

## 2020-11-09 DIAGNOSIS — D631 Anemia in chronic kidney disease: Secondary | ICD-10-CM | POA: Diagnosis not present

## 2020-11-09 DIAGNOSIS — Z992 Dependence on renal dialysis: Secondary | ICD-10-CM | POA: Diagnosis not present

## 2020-11-09 DIAGNOSIS — N186 End stage renal disease: Secondary | ICD-10-CM | POA: Diagnosis not present

## 2020-11-09 DIAGNOSIS — E785 Hyperlipidemia, unspecified: Secondary | ICD-10-CM | POA: Diagnosis not present

## 2020-11-09 DIAGNOSIS — N2581 Secondary hyperparathyroidism of renal origin: Secondary | ICD-10-CM | POA: Diagnosis not present

## 2020-11-10 DIAGNOSIS — E785 Hyperlipidemia, unspecified: Secondary | ICD-10-CM | POA: Diagnosis not present

## 2020-11-10 DIAGNOSIS — N2581 Secondary hyperparathyroidism of renal origin: Secondary | ICD-10-CM | POA: Diagnosis not present

## 2020-11-10 DIAGNOSIS — Z992 Dependence on renal dialysis: Secondary | ICD-10-CM | POA: Diagnosis not present

## 2020-11-10 DIAGNOSIS — D631 Anemia in chronic kidney disease: Secondary | ICD-10-CM | POA: Diagnosis not present

## 2020-11-10 DIAGNOSIS — N186 End stage renal disease: Secondary | ICD-10-CM | POA: Diagnosis not present

## 2020-11-11 DIAGNOSIS — D631 Anemia in chronic kidney disease: Secondary | ICD-10-CM | POA: Diagnosis not present

## 2020-11-11 DIAGNOSIS — Z992 Dependence on renal dialysis: Secondary | ICD-10-CM | POA: Diagnosis not present

## 2020-11-11 DIAGNOSIS — N186 End stage renal disease: Secondary | ICD-10-CM | POA: Diagnosis not present

## 2020-11-11 DIAGNOSIS — N2581 Secondary hyperparathyroidism of renal origin: Secondary | ICD-10-CM | POA: Diagnosis not present

## 2020-11-11 DIAGNOSIS — E785 Hyperlipidemia, unspecified: Secondary | ICD-10-CM | POA: Diagnosis not present

## 2020-11-12 DIAGNOSIS — Z992 Dependence on renal dialysis: Secondary | ICD-10-CM | POA: Diagnosis not present

## 2020-11-12 DIAGNOSIS — D631 Anemia in chronic kidney disease: Secondary | ICD-10-CM | POA: Diagnosis not present

## 2020-11-12 DIAGNOSIS — N2581 Secondary hyperparathyroidism of renal origin: Secondary | ICD-10-CM | POA: Diagnosis not present

## 2020-11-12 DIAGNOSIS — N186 End stage renal disease: Secondary | ICD-10-CM | POA: Diagnosis not present

## 2020-11-12 DIAGNOSIS — E785 Hyperlipidemia, unspecified: Secondary | ICD-10-CM | POA: Diagnosis not present

## 2020-11-12 DIAGNOSIS — E1122 Type 2 diabetes mellitus with diabetic chronic kidney disease: Secondary | ICD-10-CM | POA: Diagnosis not present

## 2020-11-13 DIAGNOSIS — Z992 Dependence on renal dialysis: Secondary | ICD-10-CM | POA: Diagnosis not present

## 2020-11-13 DIAGNOSIS — E1129 Type 2 diabetes mellitus with other diabetic kidney complication: Secondary | ICD-10-CM | POA: Diagnosis not present

## 2020-11-13 DIAGNOSIS — N2581 Secondary hyperparathyroidism of renal origin: Secondary | ICD-10-CM | POA: Diagnosis not present

## 2020-11-13 DIAGNOSIS — E785 Hyperlipidemia, unspecified: Secondary | ICD-10-CM | POA: Diagnosis not present

## 2020-11-13 DIAGNOSIS — N186 End stage renal disease: Secondary | ICD-10-CM | POA: Diagnosis not present

## 2020-11-13 DIAGNOSIS — D631 Anemia in chronic kidney disease: Secondary | ICD-10-CM | POA: Diagnosis not present

## 2020-11-13 DIAGNOSIS — Z4932 Encounter for adequacy testing for peritoneal dialysis: Secondary | ICD-10-CM | POA: Diagnosis not present

## 2020-11-14 DIAGNOSIS — N186 End stage renal disease: Secondary | ICD-10-CM | POA: Diagnosis not present

## 2020-11-14 DIAGNOSIS — D631 Anemia in chronic kidney disease: Secondary | ICD-10-CM | POA: Diagnosis not present

## 2020-11-14 DIAGNOSIS — Z992 Dependence on renal dialysis: Secondary | ICD-10-CM | POA: Diagnosis not present

## 2020-11-14 DIAGNOSIS — E1129 Type 2 diabetes mellitus with other diabetic kidney complication: Secondary | ICD-10-CM | POA: Diagnosis not present

## 2020-11-14 DIAGNOSIS — E785 Hyperlipidemia, unspecified: Secondary | ICD-10-CM | POA: Diagnosis not present

## 2020-11-14 DIAGNOSIS — Z4932 Encounter for adequacy testing for peritoneal dialysis: Secondary | ICD-10-CM | POA: Diagnosis not present

## 2020-11-14 DIAGNOSIS — N2581 Secondary hyperparathyroidism of renal origin: Secondary | ICD-10-CM | POA: Diagnosis not present

## 2020-11-15 DIAGNOSIS — E1129 Type 2 diabetes mellitus with other diabetic kidney complication: Secondary | ICD-10-CM | POA: Diagnosis not present

## 2020-11-15 DIAGNOSIS — D631 Anemia in chronic kidney disease: Secondary | ICD-10-CM | POA: Diagnosis not present

## 2020-11-15 DIAGNOSIS — N2581 Secondary hyperparathyroidism of renal origin: Secondary | ICD-10-CM | POA: Diagnosis not present

## 2020-11-15 DIAGNOSIS — E785 Hyperlipidemia, unspecified: Secondary | ICD-10-CM | POA: Diagnosis not present

## 2020-11-15 DIAGNOSIS — Z992 Dependence on renal dialysis: Secondary | ICD-10-CM | POA: Diagnosis not present

## 2020-11-15 DIAGNOSIS — N186 End stage renal disease: Secondary | ICD-10-CM | POA: Diagnosis not present

## 2020-11-15 DIAGNOSIS — Z4932 Encounter for adequacy testing for peritoneal dialysis: Secondary | ICD-10-CM | POA: Diagnosis not present

## 2020-11-16 DIAGNOSIS — N186 End stage renal disease: Secondary | ICD-10-CM | POA: Diagnosis not present

## 2020-11-16 DIAGNOSIS — Z992 Dependence on renal dialysis: Secondary | ICD-10-CM | POA: Diagnosis not present

## 2020-11-16 DIAGNOSIS — Z4932 Encounter for adequacy testing for peritoneal dialysis: Secondary | ICD-10-CM | POA: Diagnosis not present

## 2020-11-16 DIAGNOSIS — N2581 Secondary hyperparathyroidism of renal origin: Secondary | ICD-10-CM | POA: Diagnosis not present

## 2020-11-16 DIAGNOSIS — E1129 Type 2 diabetes mellitus with other diabetic kidney complication: Secondary | ICD-10-CM | POA: Diagnosis not present

## 2020-11-16 DIAGNOSIS — E785 Hyperlipidemia, unspecified: Secondary | ICD-10-CM | POA: Diagnosis not present

## 2020-11-16 DIAGNOSIS — D631 Anemia in chronic kidney disease: Secondary | ICD-10-CM | POA: Diagnosis not present

## 2020-11-17 DIAGNOSIS — E1129 Type 2 diabetes mellitus with other diabetic kidney complication: Secondary | ICD-10-CM | POA: Diagnosis not present

## 2020-11-17 DIAGNOSIS — N186 End stage renal disease: Secondary | ICD-10-CM | POA: Diagnosis not present

## 2020-11-17 DIAGNOSIS — N2581 Secondary hyperparathyroidism of renal origin: Secondary | ICD-10-CM | POA: Diagnosis not present

## 2020-11-17 DIAGNOSIS — E785 Hyperlipidemia, unspecified: Secondary | ICD-10-CM | POA: Diagnosis not present

## 2020-11-17 DIAGNOSIS — D631 Anemia in chronic kidney disease: Secondary | ICD-10-CM | POA: Diagnosis not present

## 2020-11-17 DIAGNOSIS — Z992 Dependence on renal dialysis: Secondary | ICD-10-CM | POA: Diagnosis not present

## 2020-11-17 DIAGNOSIS — Z4932 Encounter for adequacy testing for peritoneal dialysis: Secondary | ICD-10-CM | POA: Diagnosis not present

## 2020-11-18 DIAGNOSIS — N2581 Secondary hyperparathyroidism of renal origin: Secondary | ICD-10-CM | POA: Diagnosis not present

## 2020-11-18 DIAGNOSIS — E785 Hyperlipidemia, unspecified: Secondary | ICD-10-CM | POA: Diagnosis not present

## 2020-11-18 DIAGNOSIS — D631 Anemia in chronic kidney disease: Secondary | ICD-10-CM | POA: Diagnosis not present

## 2020-11-18 DIAGNOSIS — E1129 Type 2 diabetes mellitus with other diabetic kidney complication: Secondary | ICD-10-CM | POA: Diagnosis not present

## 2020-11-18 DIAGNOSIS — N186 End stage renal disease: Secondary | ICD-10-CM | POA: Diagnosis not present

## 2020-11-18 DIAGNOSIS — Z992 Dependence on renal dialysis: Secondary | ICD-10-CM | POA: Diagnosis not present

## 2020-11-18 DIAGNOSIS — Z4932 Encounter for adequacy testing for peritoneal dialysis: Secondary | ICD-10-CM | POA: Diagnosis not present

## 2020-11-19 DIAGNOSIS — Z4932 Encounter for adequacy testing for peritoneal dialysis: Secondary | ICD-10-CM | POA: Diagnosis not present

## 2020-11-19 DIAGNOSIS — E1129 Type 2 diabetes mellitus with other diabetic kidney complication: Secondary | ICD-10-CM | POA: Diagnosis not present

## 2020-11-19 DIAGNOSIS — N2581 Secondary hyperparathyroidism of renal origin: Secondary | ICD-10-CM | POA: Diagnosis not present

## 2020-11-19 DIAGNOSIS — N186 End stage renal disease: Secondary | ICD-10-CM | POA: Diagnosis not present

## 2020-11-19 DIAGNOSIS — Z992 Dependence on renal dialysis: Secondary | ICD-10-CM | POA: Diagnosis not present

## 2020-11-19 DIAGNOSIS — E785 Hyperlipidemia, unspecified: Secondary | ICD-10-CM | POA: Diagnosis not present

## 2020-11-19 DIAGNOSIS — D631 Anemia in chronic kidney disease: Secondary | ICD-10-CM | POA: Diagnosis not present

## 2020-11-20 DIAGNOSIS — N186 End stage renal disease: Secondary | ICD-10-CM | POA: Diagnosis not present

## 2020-11-20 DIAGNOSIS — E1129 Type 2 diabetes mellitus with other diabetic kidney complication: Secondary | ICD-10-CM | POA: Diagnosis not present

## 2020-11-20 DIAGNOSIS — D631 Anemia in chronic kidney disease: Secondary | ICD-10-CM | POA: Diagnosis not present

## 2020-11-20 DIAGNOSIS — Z4932 Encounter for adequacy testing for peritoneal dialysis: Secondary | ICD-10-CM | POA: Diagnosis not present

## 2020-11-20 DIAGNOSIS — E785 Hyperlipidemia, unspecified: Secondary | ICD-10-CM | POA: Diagnosis not present

## 2020-11-20 DIAGNOSIS — N2581 Secondary hyperparathyroidism of renal origin: Secondary | ICD-10-CM | POA: Diagnosis not present

## 2020-11-20 DIAGNOSIS — Z992 Dependence on renal dialysis: Secondary | ICD-10-CM | POA: Diagnosis not present

## 2020-11-21 DIAGNOSIS — D631 Anemia in chronic kidney disease: Secondary | ICD-10-CM | POA: Diagnosis not present

## 2020-11-21 DIAGNOSIS — N2581 Secondary hyperparathyroidism of renal origin: Secondary | ICD-10-CM | POA: Diagnosis not present

## 2020-11-21 DIAGNOSIS — Z992 Dependence on renal dialysis: Secondary | ICD-10-CM | POA: Diagnosis not present

## 2020-11-21 DIAGNOSIS — Z4932 Encounter for adequacy testing for peritoneal dialysis: Secondary | ICD-10-CM | POA: Diagnosis not present

## 2020-11-21 DIAGNOSIS — E1129 Type 2 diabetes mellitus with other diabetic kidney complication: Secondary | ICD-10-CM | POA: Diagnosis not present

## 2020-11-21 DIAGNOSIS — N186 End stage renal disease: Secondary | ICD-10-CM | POA: Diagnosis not present

## 2020-11-21 DIAGNOSIS — E785 Hyperlipidemia, unspecified: Secondary | ICD-10-CM | POA: Diagnosis not present

## 2020-11-22 DIAGNOSIS — E1129 Type 2 diabetes mellitus with other diabetic kidney complication: Secondary | ICD-10-CM | POA: Diagnosis not present

## 2020-11-22 DIAGNOSIS — D631 Anemia in chronic kidney disease: Secondary | ICD-10-CM | POA: Diagnosis not present

## 2020-11-22 DIAGNOSIS — E785 Hyperlipidemia, unspecified: Secondary | ICD-10-CM | POA: Diagnosis not present

## 2020-11-22 DIAGNOSIS — N2581 Secondary hyperparathyroidism of renal origin: Secondary | ICD-10-CM | POA: Diagnosis not present

## 2020-11-22 DIAGNOSIS — Z992 Dependence on renal dialysis: Secondary | ICD-10-CM | POA: Diagnosis not present

## 2020-11-22 DIAGNOSIS — Z4932 Encounter for adequacy testing for peritoneal dialysis: Secondary | ICD-10-CM | POA: Diagnosis not present

## 2020-11-22 DIAGNOSIS — N186 End stage renal disease: Secondary | ICD-10-CM | POA: Diagnosis not present

## 2020-11-23 DIAGNOSIS — D631 Anemia in chronic kidney disease: Secondary | ICD-10-CM | POA: Diagnosis not present

## 2020-11-23 DIAGNOSIS — N186 End stage renal disease: Secondary | ICD-10-CM | POA: Diagnosis not present

## 2020-11-23 DIAGNOSIS — Z4932 Encounter for adequacy testing for peritoneal dialysis: Secondary | ICD-10-CM | POA: Diagnosis not present

## 2020-11-23 DIAGNOSIS — E785 Hyperlipidemia, unspecified: Secondary | ICD-10-CM | POA: Diagnosis not present

## 2020-11-23 DIAGNOSIS — N2581 Secondary hyperparathyroidism of renal origin: Secondary | ICD-10-CM | POA: Diagnosis not present

## 2020-11-23 DIAGNOSIS — Z992 Dependence on renal dialysis: Secondary | ICD-10-CM | POA: Diagnosis not present

## 2020-11-23 DIAGNOSIS — E1129 Type 2 diabetes mellitus with other diabetic kidney complication: Secondary | ICD-10-CM | POA: Diagnosis not present

## 2020-11-24 DIAGNOSIS — N2581 Secondary hyperparathyroidism of renal origin: Secondary | ICD-10-CM | POA: Diagnosis not present

## 2020-11-24 DIAGNOSIS — D631 Anemia in chronic kidney disease: Secondary | ICD-10-CM | POA: Diagnosis not present

## 2020-11-24 DIAGNOSIS — E785 Hyperlipidemia, unspecified: Secondary | ICD-10-CM | POA: Diagnosis not present

## 2020-11-24 DIAGNOSIS — Z992 Dependence on renal dialysis: Secondary | ICD-10-CM | POA: Diagnosis not present

## 2020-11-24 DIAGNOSIS — N186 End stage renal disease: Secondary | ICD-10-CM | POA: Diagnosis not present

## 2020-11-24 DIAGNOSIS — E1129 Type 2 diabetes mellitus with other diabetic kidney complication: Secondary | ICD-10-CM | POA: Diagnosis not present

## 2020-11-24 DIAGNOSIS — Z4932 Encounter for adequacy testing for peritoneal dialysis: Secondary | ICD-10-CM | POA: Diagnosis not present

## 2020-11-25 DIAGNOSIS — N2581 Secondary hyperparathyroidism of renal origin: Secondary | ICD-10-CM | POA: Diagnosis not present

## 2020-11-25 DIAGNOSIS — E1129 Type 2 diabetes mellitus with other diabetic kidney complication: Secondary | ICD-10-CM | POA: Diagnosis not present

## 2020-11-25 DIAGNOSIS — Z992 Dependence on renal dialysis: Secondary | ICD-10-CM | POA: Diagnosis not present

## 2020-11-25 DIAGNOSIS — N186 End stage renal disease: Secondary | ICD-10-CM | POA: Diagnosis not present

## 2020-11-25 DIAGNOSIS — D631 Anemia in chronic kidney disease: Secondary | ICD-10-CM | POA: Diagnosis not present

## 2020-11-25 DIAGNOSIS — Z4932 Encounter for adequacy testing for peritoneal dialysis: Secondary | ICD-10-CM | POA: Diagnosis not present

## 2020-11-25 DIAGNOSIS — E785 Hyperlipidemia, unspecified: Secondary | ICD-10-CM | POA: Diagnosis not present

## 2020-11-26 DIAGNOSIS — N186 End stage renal disease: Secondary | ICD-10-CM | POA: Diagnosis not present

## 2020-11-26 DIAGNOSIS — Z4932 Encounter for adequacy testing for peritoneal dialysis: Secondary | ICD-10-CM | POA: Diagnosis not present

## 2020-11-26 DIAGNOSIS — E1129 Type 2 diabetes mellitus with other diabetic kidney complication: Secondary | ICD-10-CM | POA: Diagnosis not present

## 2020-11-26 DIAGNOSIS — D631 Anemia in chronic kidney disease: Secondary | ICD-10-CM | POA: Diagnosis not present

## 2020-11-26 DIAGNOSIS — N2581 Secondary hyperparathyroidism of renal origin: Secondary | ICD-10-CM | POA: Diagnosis not present

## 2020-11-26 DIAGNOSIS — Z992 Dependence on renal dialysis: Secondary | ICD-10-CM | POA: Diagnosis not present

## 2020-11-26 DIAGNOSIS — E785 Hyperlipidemia, unspecified: Secondary | ICD-10-CM | POA: Diagnosis not present

## 2020-11-27 DIAGNOSIS — E1129 Type 2 diabetes mellitus with other diabetic kidney complication: Secondary | ICD-10-CM | POA: Diagnosis not present

## 2020-11-27 DIAGNOSIS — Z4932 Encounter for adequacy testing for peritoneal dialysis: Secondary | ICD-10-CM | POA: Diagnosis not present

## 2020-11-27 DIAGNOSIS — Z992 Dependence on renal dialysis: Secondary | ICD-10-CM | POA: Diagnosis not present

## 2020-11-27 DIAGNOSIS — N186 End stage renal disease: Secondary | ICD-10-CM | POA: Diagnosis not present

## 2020-11-27 DIAGNOSIS — N2581 Secondary hyperparathyroidism of renal origin: Secondary | ICD-10-CM | POA: Diagnosis not present

## 2020-11-27 DIAGNOSIS — D631 Anemia in chronic kidney disease: Secondary | ICD-10-CM | POA: Diagnosis not present

## 2020-11-27 DIAGNOSIS — E785 Hyperlipidemia, unspecified: Secondary | ICD-10-CM | POA: Diagnosis not present

## 2020-11-28 DIAGNOSIS — Z4932 Encounter for adequacy testing for peritoneal dialysis: Secondary | ICD-10-CM | POA: Diagnosis not present

## 2020-11-28 DIAGNOSIS — Z992 Dependence on renal dialysis: Secondary | ICD-10-CM | POA: Diagnosis not present

## 2020-11-28 DIAGNOSIS — N2581 Secondary hyperparathyroidism of renal origin: Secondary | ICD-10-CM | POA: Diagnosis not present

## 2020-11-28 DIAGNOSIS — D631 Anemia in chronic kidney disease: Secondary | ICD-10-CM | POA: Diagnosis not present

## 2020-11-28 DIAGNOSIS — N186 End stage renal disease: Secondary | ICD-10-CM | POA: Diagnosis not present

## 2020-11-28 DIAGNOSIS — E1129 Type 2 diabetes mellitus with other diabetic kidney complication: Secondary | ICD-10-CM | POA: Diagnosis not present

## 2020-11-28 DIAGNOSIS — E785 Hyperlipidemia, unspecified: Secondary | ICD-10-CM | POA: Diagnosis not present

## 2020-11-29 DIAGNOSIS — D631 Anemia in chronic kidney disease: Secondary | ICD-10-CM | POA: Diagnosis not present

## 2020-11-29 DIAGNOSIS — E1129 Type 2 diabetes mellitus with other diabetic kidney complication: Secondary | ICD-10-CM | POA: Diagnosis not present

## 2020-11-29 DIAGNOSIS — E785 Hyperlipidemia, unspecified: Secondary | ICD-10-CM | POA: Diagnosis not present

## 2020-11-29 DIAGNOSIS — N186 End stage renal disease: Secondary | ICD-10-CM | POA: Diagnosis not present

## 2020-11-29 DIAGNOSIS — Z992 Dependence on renal dialysis: Secondary | ICD-10-CM | POA: Diagnosis not present

## 2020-11-29 DIAGNOSIS — Z4932 Encounter for adequacy testing for peritoneal dialysis: Secondary | ICD-10-CM | POA: Diagnosis not present

## 2020-11-29 DIAGNOSIS — N2581 Secondary hyperparathyroidism of renal origin: Secondary | ICD-10-CM | POA: Diagnosis not present

## 2020-11-30 DIAGNOSIS — Z4932 Encounter for adequacy testing for peritoneal dialysis: Secondary | ICD-10-CM | POA: Diagnosis not present

## 2020-11-30 DIAGNOSIS — D631 Anemia in chronic kidney disease: Secondary | ICD-10-CM | POA: Diagnosis not present

## 2020-11-30 DIAGNOSIS — Z992 Dependence on renal dialysis: Secondary | ICD-10-CM | POA: Diagnosis not present

## 2020-11-30 DIAGNOSIS — E785 Hyperlipidemia, unspecified: Secondary | ICD-10-CM | POA: Diagnosis not present

## 2020-11-30 DIAGNOSIS — N2581 Secondary hyperparathyroidism of renal origin: Secondary | ICD-10-CM | POA: Diagnosis not present

## 2020-11-30 DIAGNOSIS — N186 End stage renal disease: Secondary | ICD-10-CM | POA: Diagnosis not present

## 2020-11-30 DIAGNOSIS — E1129 Type 2 diabetes mellitus with other diabetic kidney complication: Secondary | ICD-10-CM | POA: Diagnosis not present

## 2020-12-01 DIAGNOSIS — E1129 Type 2 diabetes mellitus with other diabetic kidney complication: Secondary | ICD-10-CM | POA: Diagnosis not present

## 2020-12-01 DIAGNOSIS — E785 Hyperlipidemia, unspecified: Secondary | ICD-10-CM | POA: Diagnosis not present

## 2020-12-01 DIAGNOSIS — D631 Anemia in chronic kidney disease: Secondary | ICD-10-CM | POA: Diagnosis not present

## 2020-12-01 DIAGNOSIS — Z4932 Encounter for adequacy testing for peritoneal dialysis: Secondary | ICD-10-CM | POA: Diagnosis not present

## 2020-12-01 DIAGNOSIS — Z992 Dependence on renal dialysis: Secondary | ICD-10-CM | POA: Diagnosis not present

## 2020-12-01 DIAGNOSIS — N186 End stage renal disease: Secondary | ICD-10-CM | POA: Diagnosis not present

## 2020-12-01 DIAGNOSIS — N2581 Secondary hyperparathyroidism of renal origin: Secondary | ICD-10-CM | POA: Diagnosis not present

## 2020-12-02 DIAGNOSIS — D631 Anemia in chronic kidney disease: Secondary | ICD-10-CM | POA: Diagnosis not present

## 2020-12-02 DIAGNOSIS — E785 Hyperlipidemia, unspecified: Secondary | ICD-10-CM | POA: Diagnosis not present

## 2020-12-02 DIAGNOSIS — E1129 Type 2 diabetes mellitus with other diabetic kidney complication: Secondary | ICD-10-CM | POA: Diagnosis not present

## 2020-12-02 DIAGNOSIS — N2581 Secondary hyperparathyroidism of renal origin: Secondary | ICD-10-CM | POA: Diagnosis not present

## 2020-12-02 DIAGNOSIS — Z992 Dependence on renal dialysis: Secondary | ICD-10-CM | POA: Diagnosis not present

## 2020-12-02 DIAGNOSIS — N186 End stage renal disease: Secondary | ICD-10-CM | POA: Diagnosis not present

## 2020-12-02 DIAGNOSIS — Z4932 Encounter for adequacy testing for peritoneal dialysis: Secondary | ICD-10-CM | POA: Diagnosis not present

## 2020-12-02 MED ORDER — ZOLPIDEM TARTRATE ER 12.5 MG PO TBCR: 12.5000 mg | EXTENDED_RELEASE_TABLET | Freq: Every evening | ORAL | 5 refills | Status: DC | PRN

## 2020-12-02 NOTE — Addendum Note (Signed)
Addended by: Antonieta Iba on: 12/02/2020 04:21 PM   Modules accepted: Orders

## 2020-12-03 DIAGNOSIS — N186 End stage renal disease: Secondary | ICD-10-CM | POA: Diagnosis not present

## 2020-12-03 DIAGNOSIS — N2581 Secondary hyperparathyroidism of renal origin: Secondary | ICD-10-CM | POA: Diagnosis not present

## 2020-12-03 DIAGNOSIS — Z4932 Encounter for adequacy testing for peritoneal dialysis: Secondary | ICD-10-CM | POA: Diagnosis not present

## 2020-12-03 DIAGNOSIS — Z992 Dependence on renal dialysis: Secondary | ICD-10-CM | POA: Diagnosis not present

## 2020-12-03 DIAGNOSIS — E1129 Type 2 diabetes mellitus with other diabetic kidney complication: Secondary | ICD-10-CM | POA: Diagnosis not present

## 2020-12-03 DIAGNOSIS — E785 Hyperlipidemia, unspecified: Secondary | ICD-10-CM | POA: Diagnosis not present

## 2020-12-03 DIAGNOSIS — D631 Anemia in chronic kidney disease: Secondary | ICD-10-CM | POA: Diagnosis not present

## 2020-12-04 DIAGNOSIS — D631 Anemia in chronic kidney disease: Secondary | ICD-10-CM | POA: Diagnosis not present

## 2020-12-04 DIAGNOSIS — E785 Hyperlipidemia, unspecified: Secondary | ICD-10-CM | POA: Diagnosis not present

## 2020-12-04 DIAGNOSIS — N186 End stage renal disease: Secondary | ICD-10-CM | POA: Diagnosis not present

## 2020-12-04 DIAGNOSIS — E1129 Type 2 diabetes mellitus with other diabetic kidney complication: Secondary | ICD-10-CM | POA: Diagnosis not present

## 2020-12-04 DIAGNOSIS — N2581 Secondary hyperparathyroidism of renal origin: Secondary | ICD-10-CM | POA: Diagnosis not present

## 2020-12-04 DIAGNOSIS — Z4932 Encounter for adequacy testing for peritoneal dialysis: Secondary | ICD-10-CM | POA: Diagnosis not present

## 2020-12-04 DIAGNOSIS — Z992 Dependence on renal dialysis: Secondary | ICD-10-CM | POA: Diagnosis not present

## 2020-12-05 DIAGNOSIS — E785 Hyperlipidemia, unspecified: Secondary | ICD-10-CM | POA: Diagnosis not present

## 2020-12-05 DIAGNOSIS — E1129 Type 2 diabetes mellitus with other diabetic kidney complication: Secondary | ICD-10-CM | POA: Diagnosis not present

## 2020-12-05 DIAGNOSIS — N2581 Secondary hyperparathyroidism of renal origin: Secondary | ICD-10-CM | POA: Diagnosis not present

## 2020-12-05 DIAGNOSIS — D631 Anemia in chronic kidney disease: Secondary | ICD-10-CM | POA: Diagnosis not present

## 2020-12-05 DIAGNOSIS — Z4932 Encounter for adequacy testing for peritoneal dialysis: Secondary | ICD-10-CM | POA: Diagnosis not present

## 2020-12-05 DIAGNOSIS — Z992 Dependence on renal dialysis: Secondary | ICD-10-CM | POA: Diagnosis not present

## 2020-12-05 DIAGNOSIS — N186 End stage renal disease: Secondary | ICD-10-CM | POA: Diagnosis not present

## 2020-12-06 DIAGNOSIS — N2581 Secondary hyperparathyroidism of renal origin: Secondary | ICD-10-CM | POA: Diagnosis not present

## 2020-12-06 DIAGNOSIS — E1129 Type 2 diabetes mellitus with other diabetic kidney complication: Secondary | ICD-10-CM | POA: Diagnosis not present

## 2020-12-06 DIAGNOSIS — N186 End stage renal disease: Secondary | ICD-10-CM | POA: Diagnosis not present

## 2020-12-06 DIAGNOSIS — D631 Anemia in chronic kidney disease: Secondary | ICD-10-CM | POA: Diagnosis not present

## 2020-12-06 DIAGNOSIS — Z4932 Encounter for adequacy testing for peritoneal dialysis: Secondary | ICD-10-CM | POA: Diagnosis not present

## 2020-12-06 DIAGNOSIS — E785 Hyperlipidemia, unspecified: Secondary | ICD-10-CM | POA: Diagnosis not present

## 2020-12-06 DIAGNOSIS — Z992 Dependence on renal dialysis: Secondary | ICD-10-CM | POA: Diagnosis not present

## 2020-12-07 DIAGNOSIS — E785 Hyperlipidemia, unspecified: Secondary | ICD-10-CM | POA: Diagnosis not present

## 2020-12-07 DIAGNOSIS — N2581 Secondary hyperparathyroidism of renal origin: Secondary | ICD-10-CM | POA: Diagnosis not present

## 2020-12-07 DIAGNOSIS — D631 Anemia in chronic kidney disease: Secondary | ICD-10-CM | POA: Diagnosis not present

## 2020-12-07 DIAGNOSIS — Z4932 Encounter for adequacy testing for peritoneal dialysis: Secondary | ICD-10-CM | POA: Diagnosis not present

## 2020-12-07 DIAGNOSIS — E1129 Type 2 diabetes mellitus with other diabetic kidney complication: Secondary | ICD-10-CM | POA: Diagnosis not present

## 2020-12-07 DIAGNOSIS — Z992 Dependence on renal dialysis: Secondary | ICD-10-CM | POA: Diagnosis not present

## 2020-12-07 DIAGNOSIS — N186 End stage renal disease: Secondary | ICD-10-CM | POA: Diagnosis not present

## 2020-12-08 DIAGNOSIS — D631 Anemia in chronic kidney disease: Secondary | ICD-10-CM | POA: Diagnosis not present

## 2020-12-08 DIAGNOSIS — E785 Hyperlipidemia, unspecified: Secondary | ICD-10-CM | POA: Diagnosis not present

## 2020-12-08 DIAGNOSIS — E1129 Type 2 diabetes mellitus with other diabetic kidney complication: Secondary | ICD-10-CM | POA: Diagnosis not present

## 2020-12-08 DIAGNOSIS — N2581 Secondary hyperparathyroidism of renal origin: Secondary | ICD-10-CM | POA: Diagnosis not present

## 2020-12-08 DIAGNOSIS — Z4932 Encounter for adequacy testing for peritoneal dialysis: Secondary | ICD-10-CM | POA: Diagnosis not present

## 2020-12-08 DIAGNOSIS — N186 End stage renal disease: Secondary | ICD-10-CM | POA: Diagnosis not present

## 2020-12-08 DIAGNOSIS — Z992 Dependence on renal dialysis: Secondary | ICD-10-CM | POA: Diagnosis not present

## 2020-12-09 DIAGNOSIS — N2581 Secondary hyperparathyroidism of renal origin: Secondary | ICD-10-CM | POA: Diagnosis not present

## 2020-12-09 DIAGNOSIS — E1129 Type 2 diabetes mellitus with other diabetic kidney complication: Secondary | ICD-10-CM | POA: Diagnosis not present

## 2020-12-09 DIAGNOSIS — Z4932 Encounter for adequacy testing for peritoneal dialysis: Secondary | ICD-10-CM | POA: Diagnosis not present

## 2020-12-09 DIAGNOSIS — D631 Anemia in chronic kidney disease: Secondary | ICD-10-CM | POA: Diagnosis not present

## 2020-12-09 DIAGNOSIS — Z992 Dependence on renal dialysis: Secondary | ICD-10-CM | POA: Diagnosis not present

## 2020-12-09 DIAGNOSIS — N186 End stage renal disease: Secondary | ICD-10-CM | POA: Diagnosis not present

## 2020-12-09 DIAGNOSIS — E785 Hyperlipidemia, unspecified: Secondary | ICD-10-CM | POA: Diagnosis not present

## 2020-12-10 DIAGNOSIS — N2581 Secondary hyperparathyroidism of renal origin: Secondary | ICD-10-CM | POA: Diagnosis not present

## 2020-12-10 DIAGNOSIS — Z4932 Encounter for adequacy testing for peritoneal dialysis: Secondary | ICD-10-CM | POA: Diagnosis not present

## 2020-12-10 DIAGNOSIS — E785 Hyperlipidemia, unspecified: Secondary | ICD-10-CM | POA: Diagnosis not present

## 2020-12-10 DIAGNOSIS — D631 Anemia in chronic kidney disease: Secondary | ICD-10-CM | POA: Diagnosis not present

## 2020-12-10 DIAGNOSIS — N186 End stage renal disease: Secondary | ICD-10-CM | POA: Diagnosis not present

## 2020-12-10 DIAGNOSIS — Z992 Dependence on renal dialysis: Secondary | ICD-10-CM | POA: Diagnosis not present

## 2020-12-10 DIAGNOSIS — E1129 Type 2 diabetes mellitus with other diabetic kidney complication: Secondary | ICD-10-CM | POA: Diagnosis not present

## 2020-12-11 DIAGNOSIS — E1129 Type 2 diabetes mellitus with other diabetic kidney complication: Secondary | ICD-10-CM | POA: Diagnosis not present

## 2020-12-11 DIAGNOSIS — D631 Anemia in chronic kidney disease: Secondary | ICD-10-CM | POA: Diagnosis not present

## 2020-12-11 DIAGNOSIS — N2581 Secondary hyperparathyroidism of renal origin: Secondary | ICD-10-CM | POA: Diagnosis not present

## 2020-12-11 DIAGNOSIS — E785 Hyperlipidemia, unspecified: Secondary | ICD-10-CM | POA: Diagnosis not present

## 2020-12-11 DIAGNOSIS — Z4932 Encounter for adequacy testing for peritoneal dialysis: Secondary | ICD-10-CM | POA: Diagnosis not present

## 2020-12-11 DIAGNOSIS — N186 End stage renal disease: Secondary | ICD-10-CM | POA: Diagnosis not present

## 2020-12-11 DIAGNOSIS — Z992 Dependence on renal dialysis: Secondary | ICD-10-CM | POA: Diagnosis not present

## 2020-12-12 DIAGNOSIS — Z4932 Encounter for adequacy testing for peritoneal dialysis: Secondary | ICD-10-CM | POA: Diagnosis not present

## 2020-12-12 DIAGNOSIS — E1129 Type 2 diabetes mellitus with other diabetic kidney complication: Secondary | ICD-10-CM | POA: Diagnosis not present

## 2020-12-12 DIAGNOSIS — D631 Anemia in chronic kidney disease: Secondary | ICD-10-CM | POA: Diagnosis not present

## 2020-12-12 DIAGNOSIS — Z992 Dependence on renal dialysis: Secondary | ICD-10-CM | POA: Diagnosis not present

## 2020-12-12 DIAGNOSIS — E785 Hyperlipidemia, unspecified: Secondary | ICD-10-CM | POA: Diagnosis not present

## 2020-12-12 DIAGNOSIS — N186 End stage renal disease: Secondary | ICD-10-CM | POA: Diagnosis not present

## 2020-12-12 DIAGNOSIS — N2581 Secondary hyperparathyroidism of renal origin: Secondary | ICD-10-CM | POA: Diagnosis not present

## 2020-12-13 DIAGNOSIS — E1129 Type 2 diabetes mellitus with other diabetic kidney complication: Secondary | ICD-10-CM | POA: Diagnosis not present

## 2020-12-13 DIAGNOSIS — N2581 Secondary hyperparathyroidism of renal origin: Secondary | ICD-10-CM | POA: Diagnosis not present

## 2020-12-13 DIAGNOSIS — Z4932 Encounter for adequacy testing for peritoneal dialysis: Secondary | ICD-10-CM | POA: Diagnosis not present

## 2020-12-13 DIAGNOSIS — D631 Anemia in chronic kidney disease: Secondary | ICD-10-CM | POA: Diagnosis not present

## 2020-12-13 DIAGNOSIS — Z992 Dependence on renal dialysis: Secondary | ICD-10-CM | POA: Diagnosis not present

## 2020-12-13 DIAGNOSIS — N186 End stage renal disease: Secondary | ICD-10-CM | POA: Diagnosis not present

## 2020-12-13 DIAGNOSIS — E785 Hyperlipidemia, unspecified: Secondary | ICD-10-CM | POA: Diagnosis not present

## 2020-12-13 DIAGNOSIS — E1122 Type 2 diabetes mellitus with diabetic chronic kidney disease: Secondary | ICD-10-CM | POA: Diagnosis not present

## 2020-12-14 DIAGNOSIS — Z992 Dependence on renal dialysis: Secondary | ICD-10-CM | POA: Diagnosis not present

## 2020-12-14 DIAGNOSIS — N2581 Secondary hyperparathyroidism of renal origin: Secondary | ICD-10-CM | POA: Diagnosis not present

## 2020-12-14 DIAGNOSIS — N186 End stage renal disease: Secondary | ICD-10-CM | POA: Diagnosis not present

## 2020-12-14 DIAGNOSIS — E785 Hyperlipidemia, unspecified: Secondary | ICD-10-CM | POA: Diagnosis not present

## 2020-12-14 DIAGNOSIS — D631 Anemia in chronic kidney disease: Secondary | ICD-10-CM | POA: Diagnosis not present

## 2020-12-15 DIAGNOSIS — D631 Anemia in chronic kidney disease: Secondary | ICD-10-CM | POA: Diagnosis not present

## 2020-12-15 DIAGNOSIS — N186 End stage renal disease: Secondary | ICD-10-CM | POA: Diagnosis not present

## 2020-12-15 DIAGNOSIS — Z992 Dependence on renal dialysis: Secondary | ICD-10-CM | POA: Diagnosis not present

## 2020-12-15 DIAGNOSIS — N2581 Secondary hyperparathyroidism of renal origin: Secondary | ICD-10-CM | POA: Diagnosis not present

## 2020-12-15 DIAGNOSIS — E785 Hyperlipidemia, unspecified: Secondary | ICD-10-CM | POA: Diagnosis not present

## 2020-12-16 DIAGNOSIS — E785 Hyperlipidemia, unspecified: Secondary | ICD-10-CM | POA: Diagnosis not present

## 2020-12-16 DIAGNOSIS — Z6841 Body Mass Index (BMI) 40.0 and over, adult: Secondary | ICD-10-CM | POA: Diagnosis not present

## 2020-12-16 DIAGNOSIS — G4733 Obstructive sleep apnea (adult) (pediatric): Secondary | ICD-10-CM | POA: Diagnosis not present

## 2020-12-16 DIAGNOSIS — N2581 Secondary hyperparathyroidism of renal origin: Secondary | ICD-10-CM | POA: Diagnosis not present

## 2020-12-16 DIAGNOSIS — N186 End stage renal disease: Secondary | ICD-10-CM | POA: Diagnosis not present

## 2020-12-16 DIAGNOSIS — D631 Anemia in chronic kidney disease: Secondary | ICD-10-CM | POA: Diagnosis not present

## 2020-12-16 DIAGNOSIS — Z992 Dependence on renal dialysis: Secondary | ICD-10-CM | POA: Diagnosis not present

## 2020-12-16 DIAGNOSIS — Z13228 Encounter for screening for other metabolic disorders: Secondary | ICD-10-CM | POA: Diagnosis not present

## 2020-12-16 DIAGNOSIS — E668 Other obesity: Secondary | ICD-10-CM | POA: Diagnosis not present

## 2020-12-17 DIAGNOSIS — N186 End stage renal disease: Secondary | ICD-10-CM | POA: Diagnosis not present

## 2020-12-17 DIAGNOSIS — D631 Anemia in chronic kidney disease: Secondary | ICD-10-CM | POA: Diagnosis not present

## 2020-12-17 DIAGNOSIS — Z992 Dependence on renal dialysis: Secondary | ICD-10-CM | POA: Diagnosis not present

## 2020-12-17 DIAGNOSIS — E785 Hyperlipidemia, unspecified: Secondary | ICD-10-CM | POA: Diagnosis not present

## 2020-12-17 DIAGNOSIS — N2581 Secondary hyperparathyroidism of renal origin: Secondary | ICD-10-CM | POA: Diagnosis not present

## 2020-12-18 DIAGNOSIS — E785 Hyperlipidemia, unspecified: Secondary | ICD-10-CM | POA: Diagnosis not present

## 2020-12-18 DIAGNOSIS — N2581 Secondary hyperparathyroidism of renal origin: Secondary | ICD-10-CM | POA: Diagnosis not present

## 2020-12-18 DIAGNOSIS — N186 End stage renal disease: Secondary | ICD-10-CM | POA: Diagnosis not present

## 2020-12-18 DIAGNOSIS — D631 Anemia in chronic kidney disease: Secondary | ICD-10-CM | POA: Diagnosis not present

## 2020-12-18 DIAGNOSIS — Z992 Dependence on renal dialysis: Secondary | ICD-10-CM | POA: Diagnosis not present

## 2020-12-19 DIAGNOSIS — N2581 Secondary hyperparathyroidism of renal origin: Secondary | ICD-10-CM | POA: Diagnosis not present

## 2020-12-19 DIAGNOSIS — N186 End stage renal disease: Secondary | ICD-10-CM | POA: Diagnosis not present

## 2020-12-19 DIAGNOSIS — Z992 Dependence on renal dialysis: Secondary | ICD-10-CM | POA: Diagnosis not present

## 2020-12-19 DIAGNOSIS — D631 Anemia in chronic kidney disease: Secondary | ICD-10-CM | POA: Diagnosis not present

## 2020-12-19 DIAGNOSIS — E785 Hyperlipidemia, unspecified: Secondary | ICD-10-CM | POA: Diagnosis not present

## 2020-12-20 DIAGNOSIS — D631 Anemia in chronic kidney disease: Secondary | ICD-10-CM | POA: Diagnosis not present

## 2020-12-20 DIAGNOSIS — N186 End stage renal disease: Secondary | ICD-10-CM | POA: Diagnosis not present

## 2020-12-20 DIAGNOSIS — E785 Hyperlipidemia, unspecified: Secondary | ICD-10-CM | POA: Diagnosis not present

## 2020-12-20 DIAGNOSIS — N2581 Secondary hyperparathyroidism of renal origin: Secondary | ICD-10-CM | POA: Diagnosis not present

## 2020-12-20 DIAGNOSIS — Z992 Dependence on renal dialysis: Secondary | ICD-10-CM | POA: Diagnosis not present

## 2020-12-21 DIAGNOSIS — N186 End stage renal disease: Secondary | ICD-10-CM | POA: Diagnosis not present

## 2020-12-21 DIAGNOSIS — N2581 Secondary hyperparathyroidism of renal origin: Secondary | ICD-10-CM | POA: Diagnosis not present

## 2020-12-21 DIAGNOSIS — Z992 Dependence on renal dialysis: Secondary | ICD-10-CM | POA: Diagnosis not present

## 2020-12-21 DIAGNOSIS — E785 Hyperlipidemia, unspecified: Secondary | ICD-10-CM | POA: Diagnosis not present

## 2020-12-21 DIAGNOSIS — D631 Anemia in chronic kidney disease: Secondary | ICD-10-CM | POA: Diagnosis not present

## 2020-12-22 DIAGNOSIS — D631 Anemia in chronic kidney disease: Secondary | ICD-10-CM | POA: Diagnosis not present

## 2020-12-22 DIAGNOSIS — E785 Hyperlipidemia, unspecified: Secondary | ICD-10-CM | POA: Diagnosis not present

## 2020-12-22 DIAGNOSIS — Z992 Dependence on renal dialysis: Secondary | ICD-10-CM | POA: Diagnosis not present

## 2020-12-22 DIAGNOSIS — N186 End stage renal disease: Secondary | ICD-10-CM | POA: Diagnosis not present

## 2020-12-22 DIAGNOSIS — N2581 Secondary hyperparathyroidism of renal origin: Secondary | ICD-10-CM | POA: Diagnosis not present

## 2020-12-23 DIAGNOSIS — E785 Hyperlipidemia, unspecified: Secondary | ICD-10-CM | POA: Diagnosis not present

## 2020-12-23 DIAGNOSIS — N2581 Secondary hyperparathyroidism of renal origin: Secondary | ICD-10-CM | POA: Diagnosis not present

## 2020-12-23 DIAGNOSIS — N186 End stage renal disease: Secondary | ICD-10-CM | POA: Diagnosis not present

## 2020-12-23 DIAGNOSIS — D631 Anemia in chronic kidney disease: Secondary | ICD-10-CM | POA: Diagnosis not present

## 2020-12-23 DIAGNOSIS — Z992 Dependence on renal dialysis: Secondary | ICD-10-CM | POA: Diagnosis not present

## 2020-12-24 DIAGNOSIS — E785 Hyperlipidemia, unspecified: Secondary | ICD-10-CM | POA: Diagnosis not present

## 2020-12-24 DIAGNOSIS — N2581 Secondary hyperparathyroidism of renal origin: Secondary | ICD-10-CM | POA: Diagnosis not present

## 2020-12-24 DIAGNOSIS — N186 End stage renal disease: Secondary | ICD-10-CM | POA: Diagnosis not present

## 2020-12-24 DIAGNOSIS — Z992 Dependence on renal dialysis: Secondary | ICD-10-CM | POA: Diagnosis not present

## 2020-12-24 DIAGNOSIS — D631 Anemia in chronic kidney disease: Secondary | ICD-10-CM | POA: Diagnosis not present

## 2020-12-25 DIAGNOSIS — Z992 Dependence on renal dialysis: Secondary | ICD-10-CM | POA: Diagnosis not present

## 2020-12-25 DIAGNOSIS — N186 End stage renal disease: Secondary | ICD-10-CM | POA: Diagnosis not present

## 2020-12-25 DIAGNOSIS — Z20822 Contact with and (suspected) exposure to covid-19: Secondary | ICD-10-CM | POA: Diagnosis not present

## 2020-12-25 DIAGNOSIS — D631 Anemia in chronic kidney disease: Secondary | ICD-10-CM | POA: Diagnosis not present

## 2020-12-25 DIAGNOSIS — E785 Hyperlipidemia, unspecified: Secondary | ICD-10-CM | POA: Diagnosis not present

## 2020-12-25 DIAGNOSIS — N2581 Secondary hyperparathyroidism of renal origin: Secondary | ICD-10-CM | POA: Diagnosis not present

## 2020-12-26 DIAGNOSIS — Z992 Dependence on renal dialysis: Secondary | ICD-10-CM | POA: Diagnosis not present

## 2020-12-26 DIAGNOSIS — D631 Anemia in chronic kidney disease: Secondary | ICD-10-CM | POA: Diagnosis not present

## 2020-12-26 DIAGNOSIS — E785 Hyperlipidemia, unspecified: Secondary | ICD-10-CM | POA: Diagnosis not present

## 2020-12-26 DIAGNOSIS — N186 End stage renal disease: Secondary | ICD-10-CM | POA: Diagnosis not present

## 2020-12-26 DIAGNOSIS — N2581 Secondary hyperparathyroidism of renal origin: Secondary | ICD-10-CM | POA: Diagnosis not present

## 2020-12-27 DIAGNOSIS — D631 Anemia in chronic kidney disease: Secondary | ICD-10-CM | POA: Diagnosis not present

## 2020-12-27 DIAGNOSIS — N186 End stage renal disease: Secondary | ICD-10-CM | POA: Diagnosis not present

## 2020-12-27 DIAGNOSIS — N2581 Secondary hyperparathyroidism of renal origin: Secondary | ICD-10-CM | POA: Diagnosis not present

## 2020-12-27 DIAGNOSIS — E785 Hyperlipidemia, unspecified: Secondary | ICD-10-CM | POA: Diagnosis not present

## 2020-12-27 DIAGNOSIS — Z992 Dependence on renal dialysis: Secondary | ICD-10-CM | POA: Diagnosis not present

## 2020-12-28 DIAGNOSIS — N186 End stage renal disease: Secondary | ICD-10-CM | POA: Diagnosis not present

## 2020-12-28 DIAGNOSIS — Z992 Dependence on renal dialysis: Secondary | ICD-10-CM | POA: Diagnosis not present

## 2020-12-28 DIAGNOSIS — E785 Hyperlipidemia, unspecified: Secondary | ICD-10-CM | POA: Diagnosis not present

## 2020-12-28 DIAGNOSIS — D631 Anemia in chronic kidney disease: Secondary | ICD-10-CM | POA: Diagnosis not present

## 2020-12-28 DIAGNOSIS — N5201 Erectile dysfunction due to arterial insufficiency: Secondary | ICD-10-CM | POA: Diagnosis not present

## 2020-12-28 DIAGNOSIS — N2581 Secondary hyperparathyroidism of renal origin: Secondary | ICD-10-CM | POA: Diagnosis not present

## 2020-12-29 DIAGNOSIS — D631 Anemia in chronic kidney disease: Secondary | ICD-10-CM | POA: Diagnosis not present

## 2020-12-29 DIAGNOSIS — N186 End stage renal disease: Secondary | ICD-10-CM | POA: Diagnosis not present

## 2020-12-29 DIAGNOSIS — Z992 Dependence on renal dialysis: Secondary | ICD-10-CM | POA: Diagnosis not present

## 2020-12-29 DIAGNOSIS — E785 Hyperlipidemia, unspecified: Secondary | ICD-10-CM | POA: Diagnosis not present

## 2020-12-29 DIAGNOSIS — N2581 Secondary hyperparathyroidism of renal origin: Secondary | ICD-10-CM | POA: Diagnosis not present

## 2020-12-30 DIAGNOSIS — E785 Hyperlipidemia, unspecified: Secondary | ICD-10-CM | POA: Diagnosis not present

## 2020-12-30 DIAGNOSIS — N186 End stage renal disease: Secondary | ICD-10-CM | POA: Diagnosis not present

## 2020-12-30 DIAGNOSIS — N2581 Secondary hyperparathyroidism of renal origin: Secondary | ICD-10-CM | POA: Diagnosis not present

## 2020-12-30 DIAGNOSIS — Z992 Dependence on renal dialysis: Secondary | ICD-10-CM | POA: Diagnosis not present

## 2020-12-30 DIAGNOSIS — D631 Anemia in chronic kidney disease: Secondary | ICD-10-CM | POA: Diagnosis not present

## 2020-12-31 DIAGNOSIS — D631 Anemia in chronic kidney disease: Secondary | ICD-10-CM | POA: Diagnosis not present

## 2020-12-31 DIAGNOSIS — E668 Other obesity: Secondary | ICD-10-CM | POA: Diagnosis not present

## 2020-12-31 DIAGNOSIS — E1122 Type 2 diabetes mellitus with diabetic chronic kidney disease: Secondary | ICD-10-CM | POA: Diagnosis not present

## 2020-12-31 DIAGNOSIS — Z13228 Encounter for screening for other metabolic disorders: Secondary | ICD-10-CM | POA: Diagnosis not present

## 2020-12-31 DIAGNOSIS — E785 Hyperlipidemia, unspecified: Secondary | ICD-10-CM | POA: Diagnosis not present

## 2020-12-31 DIAGNOSIS — Z992 Dependence on renal dialysis: Secondary | ICD-10-CM | POA: Diagnosis not present

## 2020-12-31 DIAGNOSIS — Z6841 Body Mass Index (BMI) 40.0 and over, adult: Secondary | ICD-10-CM | POA: Diagnosis not present

## 2020-12-31 DIAGNOSIS — N186 End stage renal disease: Secondary | ICD-10-CM | POA: Diagnosis not present

## 2020-12-31 DIAGNOSIS — I120 Hypertensive chronic kidney disease with stage 5 chronic kidney disease or end stage renal disease: Secondary | ICD-10-CM | POA: Diagnosis not present

## 2020-12-31 DIAGNOSIS — N2581 Secondary hyperparathyroidism of renal origin: Secondary | ICD-10-CM | POA: Diagnosis not present

## 2021-01-01 DIAGNOSIS — D631 Anemia in chronic kidney disease: Secondary | ICD-10-CM | POA: Diagnosis not present

## 2021-01-01 DIAGNOSIS — Z992 Dependence on renal dialysis: Secondary | ICD-10-CM | POA: Diagnosis not present

## 2021-01-01 DIAGNOSIS — E785 Hyperlipidemia, unspecified: Secondary | ICD-10-CM | POA: Diagnosis not present

## 2021-01-01 DIAGNOSIS — N2581 Secondary hyperparathyroidism of renal origin: Secondary | ICD-10-CM | POA: Diagnosis not present

## 2021-01-01 DIAGNOSIS — N186 End stage renal disease: Secondary | ICD-10-CM | POA: Diagnosis not present

## 2021-01-02 DIAGNOSIS — N2581 Secondary hyperparathyroidism of renal origin: Secondary | ICD-10-CM | POA: Diagnosis not present

## 2021-01-02 DIAGNOSIS — D631 Anemia in chronic kidney disease: Secondary | ICD-10-CM | POA: Diagnosis not present

## 2021-01-02 DIAGNOSIS — E785 Hyperlipidemia, unspecified: Secondary | ICD-10-CM | POA: Diagnosis not present

## 2021-01-02 DIAGNOSIS — N186 End stage renal disease: Secondary | ICD-10-CM | POA: Diagnosis not present

## 2021-01-02 DIAGNOSIS — Z992 Dependence on renal dialysis: Secondary | ICD-10-CM | POA: Diagnosis not present

## 2021-01-03 DIAGNOSIS — Z992 Dependence on renal dialysis: Secondary | ICD-10-CM | POA: Diagnosis not present

## 2021-01-03 DIAGNOSIS — D631 Anemia in chronic kidney disease: Secondary | ICD-10-CM | POA: Diagnosis not present

## 2021-01-03 DIAGNOSIS — E785 Hyperlipidemia, unspecified: Secondary | ICD-10-CM | POA: Diagnosis not present

## 2021-01-03 DIAGNOSIS — N186 End stage renal disease: Secondary | ICD-10-CM | POA: Diagnosis not present

## 2021-01-03 DIAGNOSIS — N2581 Secondary hyperparathyroidism of renal origin: Secondary | ICD-10-CM | POA: Diagnosis not present

## 2021-01-04 DIAGNOSIS — N2581 Secondary hyperparathyroidism of renal origin: Secondary | ICD-10-CM | POA: Diagnosis not present

## 2021-01-04 DIAGNOSIS — D631 Anemia in chronic kidney disease: Secondary | ICD-10-CM | POA: Diagnosis not present

## 2021-01-04 DIAGNOSIS — E785 Hyperlipidemia, unspecified: Secondary | ICD-10-CM | POA: Diagnosis not present

## 2021-01-04 DIAGNOSIS — N186 End stage renal disease: Secondary | ICD-10-CM | POA: Diagnosis not present

## 2021-01-04 DIAGNOSIS — Z992 Dependence on renal dialysis: Secondary | ICD-10-CM | POA: Diagnosis not present

## 2021-01-05 DIAGNOSIS — Z992 Dependence on renal dialysis: Secondary | ICD-10-CM | POA: Diagnosis not present

## 2021-01-05 DIAGNOSIS — E785 Hyperlipidemia, unspecified: Secondary | ICD-10-CM | POA: Diagnosis not present

## 2021-01-05 DIAGNOSIS — D631 Anemia in chronic kidney disease: Secondary | ICD-10-CM | POA: Diagnosis not present

## 2021-01-05 DIAGNOSIS — N186 End stage renal disease: Secondary | ICD-10-CM | POA: Diagnosis not present

## 2021-01-05 DIAGNOSIS — N2581 Secondary hyperparathyroidism of renal origin: Secondary | ICD-10-CM | POA: Diagnosis not present

## 2021-01-06 DIAGNOSIS — N2581 Secondary hyperparathyroidism of renal origin: Secondary | ICD-10-CM | POA: Diagnosis not present

## 2021-01-06 DIAGNOSIS — Z992 Dependence on renal dialysis: Secondary | ICD-10-CM | POA: Diagnosis not present

## 2021-01-06 DIAGNOSIS — N186 End stage renal disease: Secondary | ICD-10-CM | POA: Diagnosis not present

## 2021-01-06 DIAGNOSIS — E785 Hyperlipidemia, unspecified: Secondary | ICD-10-CM | POA: Diagnosis not present

## 2021-01-06 DIAGNOSIS — D631 Anemia in chronic kidney disease: Secondary | ICD-10-CM | POA: Diagnosis not present

## 2021-01-07 DIAGNOSIS — D631 Anemia in chronic kidney disease: Secondary | ICD-10-CM | POA: Diagnosis not present

## 2021-01-07 DIAGNOSIS — N2581 Secondary hyperparathyroidism of renal origin: Secondary | ICD-10-CM | POA: Diagnosis not present

## 2021-01-07 DIAGNOSIS — N186 End stage renal disease: Secondary | ICD-10-CM | POA: Diagnosis not present

## 2021-01-07 DIAGNOSIS — Z992 Dependence on renal dialysis: Secondary | ICD-10-CM | POA: Diagnosis not present

## 2021-01-07 DIAGNOSIS — E785 Hyperlipidemia, unspecified: Secondary | ICD-10-CM | POA: Diagnosis not present

## 2021-01-08 DIAGNOSIS — D631 Anemia in chronic kidney disease: Secondary | ICD-10-CM | POA: Diagnosis not present

## 2021-01-08 DIAGNOSIS — N2581 Secondary hyperparathyroidism of renal origin: Secondary | ICD-10-CM | POA: Diagnosis not present

## 2021-01-08 DIAGNOSIS — N186 End stage renal disease: Secondary | ICD-10-CM | POA: Diagnosis not present

## 2021-01-08 DIAGNOSIS — Z992 Dependence on renal dialysis: Secondary | ICD-10-CM | POA: Diagnosis not present

## 2021-01-08 DIAGNOSIS — E785 Hyperlipidemia, unspecified: Secondary | ICD-10-CM | POA: Diagnosis not present

## 2021-01-09 DIAGNOSIS — E785 Hyperlipidemia, unspecified: Secondary | ICD-10-CM | POA: Diagnosis not present

## 2021-01-09 DIAGNOSIS — N186 End stage renal disease: Secondary | ICD-10-CM | POA: Diagnosis not present

## 2021-01-09 DIAGNOSIS — D631 Anemia in chronic kidney disease: Secondary | ICD-10-CM | POA: Diagnosis not present

## 2021-01-09 DIAGNOSIS — Z992 Dependence on renal dialysis: Secondary | ICD-10-CM | POA: Diagnosis not present

## 2021-01-09 DIAGNOSIS — N2581 Secondary hyperparathyroidism of renal origin: Secondary | ICD-10-CM | POA: Diagnosis not present

## 2021-01-10 DIAGNOSIS — Z992 Dependence on renal dialysis: Secondary | ICD-10-CM | POA: Diagnosis not present

## 2021-01-10 DIAGNOSIS — E785 Hyperlipidemia, unspecified: Secondary | ICD-10-CM | POA: Diagnosis not present

## 2021-01-10 DIAGNOSIS — N2581 Secondary hyperparathyroidism of renal origin: Secondary | ICD-10-CM | POA: Diagnosis not present

## 2021-01-10 DIAGNOSIS — N186 End stage renal disease: Secondary | ICD-10-CM | POA: Diagnosis not present

## 2021-01-10 DIAGNOSIS — D631 Anemia in chronic kidney disease: Secondary | ICD-10-CM | POA: Diagnosis not present

## 2021-01-11 DIAGNOSIS — N2581 Secondary hyperparathyroidism of renal origin: Secondary | ICD-10-CM | POA: Diagnosis not present

## 2021-01-11 DIAGNOSIS — N186 End stage renal disease: Secondary | ICD-10-CM | POA: Diagnosis not present

## 2021-01-11 DIAGNOSIS — Z992 Dependence on renal dialysis: Secondary | ICD-10-CM | POA: Diagnosis not present

## 2021-01-11 DIAGNOSIS — E785 Hyperlipidemia, unspecified: Secondary | ICD-10-CM | POA: Diagnosis not present

## 2021-01-11 DIAGNOSIS — D631 Anemia in chronic kidney disease: Secondary | ICD-10-CM | POA: Diagnosis not present

## 2021-01-12 DIAGNOSIS — E785 Hyperlipidemia, unspecified: Secondary | ICD-10-CM | POA: Diagnosis not present

## 2021-01-12 DIAGNOSIS — N186 End stage renal disease: Secondary | ICD-10-CM | POA: Diagnosis not present

## 2021-01-12 DIAGNOSIS — D631 Anemia in chronic kidney disease: Secondary | ICD-10-CM | POA: Diagnosis not present

## 2021-01-12 DIAGNOSIS — N2581 Secondary hyperparathyroidism of renal origin: Secondary | ICD-10-CM | POA: Diagnosis not present

## 2021-01-12 DIAGNOSIS — Z992 Dependence on renal dialysis: Secondary | ICD-10-CM | POA: Diagnosis not present

## 2021-01-13 DIAGNOSIS — E785 Hyperlipidemia, unspecified: Secondary | ICD-10-CM | POA: Diagnosis not present

## 2021-01-13 DIAGNOSIS — N2581 Secondary hyperparathyroidism of renal origin: Secondary | ICD-10-CM | POA: Diagnosis not present

## 2021-01-13 DIAGNOSIS — N186 End stage renal disease: Secondary | ICD-10-CM | POA: Diagnosis not present

## 2021-01-13 DIAGNOSIS — Z992 Dependence on renal dialysis: Secondary | ICD-10-CM | POA: Diagnosis not present

## 2021-01-13 DIAGNOSIS — E1122 Type 2 diabetes mellitus with diabetic chronic kidney disease: Secondary | ICD-10-CM | POA: Diagnosis not present

## 2021-01-13 DIAGNOSIS — D631 Anemia in chronic kidney disease: Secondary | ICD-10-CM | POA: Diagnosis not present

## 2021-01-14 DIAGNOSIS — D631 Anemia in chronic kidney disease: Secondary | ICD-10-CM | POA: Diagnosis not present

## 2021-01-14 DIAGNOSIS — N2581 Secondary hyperparathyroidism of renal origin: Secondary | ICD-10-CM | POA: Diagnosis not present

## 2021-01-14 DIAGNOSIS — Z992 Dependence on renal dialysis: Secondary | ICD-10-CM | POA: Diagnosis not present

## 2021-01-14 DIAGNOSIS — N186 End stage renal disease: Secondary | ICD-10-CM | POA: Diagnosis not present

## 2021-01-14 DIAGNOSIS — E785 Hyperlipidemia, unspecified: Secondary | ICD-10-CM | POA: Diagnosis not present

## 2021-01-15 DIAGNOSIS — N2581 Secondary hyperparathyroidism of renal origin: Secondary | ICD-10-CM | POA: Diagnosis not present

## 2021-01-15 DIAGNOSIS — D631 Anemia in chronic kidney disease: Secondary | ICD-10-CM | POA: Diagnosis not present

## 2021-01-15 DIAGNOSIS — N186 End stage renal disease: Secondary | ICD-10-CM | POA: Diagnosis not present

## 2021-01-15 DIAGNOSIS — E785 Hyperlipidemia, unspecified: Secondary | ICD-10-CM | POA: Diagnosis not present

## 2021-01-15 DIAGNOSIS — Z992 Dependence on renal dialysis: Secondary | ICD-10-CM | POA: Diagnosis not present

## 2021-01-16 DIAGNOSIS — Z992 Dependence on renal dialysis: Secondary | ICD-10-CM | POA: Diagnosis not present

## 2021-01-16 DIAGNOSIS — N186 End stage renal disease: Secondary | ICD-10-CM | POA: Diagnosis not present

## 2021-01-16 DIAGNOSIS — E785 Hyperlipidemia, unspecified: Secondary | ICD-10-CM | POA: Diagnosis not present

## 2021-01-16 DIAGNOSIS — D631 Anemia in chronic kidney disease: Secondary | ICD-10-CM | POA: Diagnosis not present

## 2021-01-16 DIAGNOSIS — N2581 Secondary hyperparathyroidism of renal origin: Secondary | ICD-10-CM | POA: Diagnosis not present

## 2021-01-17 DIAGNOSIS — N186 End stage renal disease: Secondary | ICD-10-CM | POA: Diagnosis not present

## 2021-01-17 DIAGNOSIS — E785 Hyperlipidemia, unspecified: Secondary | ICD-10-CM | POA: Diagnosis not present

## 2021-01-17 DIAGNOSIS — D631 Anemia in chronic kidney disease: Secondary | ICD-10-CM | POA: Diagnosis not present

## 2021-01-17 DIAGNOSIS — Z992 Dependence on renal dialysis: Secondary | ICD-10-CM | POA: Diagnosis not present

## 2021-01-17 DIAGNOSIS — N2581 Secondary hyperparathyroidism of renal origin: Secondary | ICD-10-CM | POA: Diagnosis not present

## 2021-01-18 DIAGNOSIS — Z992 Dependence on renal dialysis: Secondary | ICD-10-CM | POA: Diagnosis not present

## 2021-01-18 DIAGNOSIS — N2581 Secondary hyperparathyroidism of renal origin: Secondary | ICD-10-CM | POA: Diagnosis not present

## 2021-01-18 DIAGNOSIS — D631 Anemia in chronic kidney disease: Secondary | ICD-10-CM | POA: Diagnosis not present

## 2021-01-18 DIAGNOSIS — E785 Hyperlipidemia, unspecified: Secondary | ICD-10-CM | POA: Diagnosis not present

## 2021-01-18 DIAGNOSIS — N186 End stage renal disease: Secondary | ICD-10-CM | POA: Diagnosis not present

## 2021-01-19 DIAGNOSIS — Z992 Dependence on renal dialysis: Secondary | ICD-10-CM | POA: Diagnosis not present

## 2021-01-19 DIAGNOSIS — N2581 Secondary hyperparathyroidism of renal origin: Secondary | ICD-10-CM | POA: Diagnosis not present

## 2021-01-19 DIAGNOSIS — N186 End stage renal disease: Secondary | ICD-10-CM | POA: Diagnosis not present

## 2021-01-19 DIAGNOSIS — D631 Anemia in chronic kidney disease: Secondary | ICD-10-CM | POA: Diagnosis not present

## 2021-01-19 DIAGNOSIS — E785 Hyperlipidemia, unspecified: Secondary | ICD-10-CM | POA: Diagnosis not present

## 2021-01-20 DIAGNOSIS — N186 End stage renal disease: Secondary | ICD-10-CM | POA: Diagnosis not present

## 2021-01-20 DIAGNOSIS — E785 Hyperlipidemia, unspecified: Secondary | ICD-10-CM | POA: Diagnosis not present

## 2021-01-20 DIAGNOSIS — N2581 Secondary hyperparathyroidism of renal origin: Secondary | ICD-10-CM | POA: Diagnosis not present

## 2021-01-20 DIAGNOSIS — D631 Anemia in chronic kidney disease: Secondary | ICD-10-CM | POA: Diagnosis not present

## 2021-01-20 DIAGNOSIS — Z992 Dependence on renal dialysis: Secondary | ICD-10-CM | POA: Diagnosis not present

## 2021-01-21 DIAGNOSIS — D631 Anemia in chronic kidney disease: Secondary | ICD-10-CM | POA: Diagnosis not present

## 2021-01-21 DIAGNOSIS — Z992 Dependence on renal dialysis: Secondary | ICD-10-CM | POA: Diagnosis not present

## 2021-01-21 DIAGNOSIS — N186 End stage renal disease: Secondary | ICD-10-CM | POA: Diagnosis not present

## 2021-01-21 DIAGNOSIS — N2581 Secondary hyperparathyroidism of renal origin: Secondary | ICD-10-CM | POA: Diagnosis not present

## 2021-01-21 DIAGNOSIS — E785 Hyperlipidemia, unspecified: Secondary | ICD-10-CM | POA: Diagnosis not present

## 2021-01-22 DIAGNOSIS — Z992 Dependence on renal dialysis: Secondary | ICD-10-CM | POA: Diagnosis not present

## 2021-01-22 DIAGNOSIS — E785 Hyperlipidemia, unspecified: Secondary | ICD-10-CM | POA: Diagnosis not present

## 2021-01-22 DIAGNOSIS — N186 End stage renal disease: Secondary | ICD-10-CM | POA: Diagnosis not present

## 2021-01-22 DIAGNOSIS — D631 Anemia in chronic kidney disease: Secondary | ICD-10-CM | POA: Diagnosis not present

## 2021-01-22 DIAGNOSIS — N2581 Secondary hyperparathyroidism of renal origin: Secondary | ICD-10-CM | POA: Diagnosis not present

## 2021-01-23 DIAGNOSIS — E785 Hyperlipidemia, unspecified: Secondary | ICD-10-CM | POA: Diagnosis not present

## 2021-01-23 DIAGNOSIS — N186 End stage renal disease: Secondary | ICD-10-CM | POA: Diagnosis not present

## 2021-01-23 DIAGNOSIS — D631 Anemia in chronic kidney disease: Secondary | ICD-10-CM | POA: Diagnosis not present

## 2021-01-23 DIAGNOSIS — Z992 Dependence on renal dialysis: Secondary | ICD-10-CM | POA: Diagnosis not present

## 2021-01-23 DIAGNOSIS — N2581 Secondary hyperparathyroidism of renal origin: Secondary | ICD-10-CM | POA: Diagnosis not present

## 2021-01-24 DIAGNOSIS — Z992 Dependence on renal dialysis: Secondary | ICD-10-CM | POA: Diagnosis not present

## 2021-01-24 DIAGNOSIS — E785 Hyperlipidemia, unspecified: Secondary | ICD-10-CM | POA: Diagnosis not present

## 2021-01-24 DIAGNOSIS — N186 End stage renal disease: Secondary | ICD-10-CM | POA: Diagnosis not present

## 2021-01-24 DIAGNOSIS — D631 Anemia in chronic kidney disease: Secondary | ICD-10-CM | POA: Diagnosis not present

## 2021-01-24 DIAGNOSIS — N2581 Secondary hyperparathyroidism of renal origin: Secondary | ICD-10-CM | POA: Diagnosis not present

## 2021-01-25 DIAGNOSIS — D631 Anemia in chronic kidney disease: Secondary | ICD-10-CM | POA: Diagnosis not present

## 2021-01-25 DIAGNOSIS — E785 Hyperlipidemia, unspecified: Secondary | ICD-10-CM | POA: Diagnosis not present

## 2021-01-25 DIAGNOSIS — N2581 Secondary hyperparathyroidism of renal origin: Secondary | ICD-10-CM | POA: Diagnosis not present

## 2021-01-25 DIAGNOSIS — N186 End stage renal disease: Secondary | ICD-10-CM | POA: Diagnosis not present

## 2021-01-25 DIAGNOSIS — Z992 Dependence on renal dialysis: Secondary | ICD-10-CM | POA: Diagnosis not present

## 2021-01-26 DIAGNOSIS — E785 Hyperlipidemia, unspecified: Secondary | ICD-10-CM | POA: Diagnosis not present

## 2021-01-26 DIAGNOSIS — Z992 Dependence on renal dialysis: Secondary | ICD-10-CM | POA: Diagnosis not present

## 2021-01-26 DIAGNOSIS — N186 End stage renal disease: Secondary | ICD-10-CM | POA: Diagnosis not present

## 2021-01-26 DIAGNOSIS — N2581 Secondary hyperparathyroidism of renal origin: Secondary | ICD-10-CM | POA: Diagnosis not present

## 2021-01-26 DIAGNOSIS — D631 Anemia in chronic kidney disease: Secondary | ICD-10-CM | POA: Diagnosis not present

## 2021-01-27 DIAGNOSIS — N186 End stage renal disease: Secondary | ICD-10-CM | POA: Diagnosis not present

## 2021-01-27 DIAGNOSIS — E785 Hyperlipidemia, unspecified: Secondary | ICD-10-CM | POA: Diagnosis not present

## 2021-01-27 DIAGNOSIS — N2581 Secondary hyperparathyroidism of renal origin: Secondary | ICD-10-CM | POA: Diagnosis not present

## 2021-01-27 DIAGNOSIS — Z992 Dependence on renal dialysis: Secondary | ICD-10-CM | POA: Diagnosis not present

## 2021-01-27 DIAGNOSIS — D631 Anemia in chronic kidney disease: Secondary | ICD-10-CM | POA: Diagnosis not present

## 2021-01-28 DIAGNOSIS — Z992 Dependence on renal dialysis: Secondary | ICD-10-CM | POA: Diagnosis not present

## 2021-01-28 DIAGNOSIS — D631 Anemia in chronic kidney disease: Secondary | ICD-10-CM | POA: Diagnosis not present

## 2021-01-28 DIAGNOSIS — N186 End stage renal disease: Secondary | ICD-10-CM | POA: Diagnosis not present

## 2021-01-28 DIAGNOSIS — N2581 Secondary hyperparathyroidism of renal origin: Secondary | ICD-10-CM | POA: Diagnosis not present

## 2021-01-28 DIAGNOSIS — E785 Hyperlipidemia, unspecified: Secondary | ICD-10-CM | POA: Diagnosis not present

## 2021-01-29 DIAGNOSIS — N2581 Secondary hyperparathyroidism of renal origin: Secondary | ICD-10-CM | POA: Diagnosis not present

## 2021-01-29 DIAGNOSIS — E785 Hyperlipidemia, unspecified: Secondary | ICD-10-CM | POA: Diagnosis not present

## 2021-01-29 DIAGNOSIS — D631 Anemia in chronic kidney disease: Secondary | ICD-10-CM | POA: Diagnosis not present

## 2021-01-29 DIAGNOSIS — Z992 Dependence on renal dialysis: Secondary | ICD-10-CM | POA: Diagnosis not present

## 2021-01-29 DIAGNOSIS — N186 End stage renal disease: Secondary | ICD-10-CM | POA: Diagnosis not present

## 2021-01-30 DIAGNOSIS — Z992 Dependence on renal dialysis: Secondary | ICD-10-CM | POA: Diagnosis not present

## 2021-01-30 DIAGNOSIS — E785 Hyperlipidemia, unspecified: Secondary | ICD-10-CM | POA: Diagnosis not present

## 2021-01-30 DIAGNOSIS — N186 End stage renal disease: Secondary | ICD-10-CM | POA: Diagnosis not present

## 2021-01-30 DIAGNOSIS — D631 Anemia in chronic kidney disease: Secondary | ICD-10-CM | POA: Diagnosis not present

## 2021-01-30 DIAGNOSIS — N2581 Secondary hyperparathyroidism of renal origin: Secondary | ICD-10-CM | POA: Diagnosis not present

## 2021-01-31 DIAGNOSIS — Z992 Dependence on renal dialysis: Secondary | ICD-10-CM | POA: Diagnosis not present

## 2021-01-31 DIAGNOSIS — N2581 Secondary hyperparathyroidism of renal origin: Secondary | ICD-10-CM | POA: Diagnosis not present

## 2021-01-31 DIAGNOSIS — N186 End stage renal disease: Secondary | ICD-10-CM | POA: Diagnosis not present

## 2021-01-31 DIAGNOSIS — E785 Hyperlipidemia, unspecified: Secondary | ICD-10-CM | POA: Diagnosis not present

## 2021-01-31 DIAGNOSIS — D631 Anemia in chronic kidney disease: Secondary | ICD-10-CM | POA: Diagnosis not present

## 2021-02-01 DIAGNOSIS — I120 Hypertensive chronic kidney disease with stage 5 chronic kidney disease or end stage renal disease: Secondary | ICD-10-CM | POA: Diagnosis not present

## 2021-02-01 DIAGNOSIS — E785 Hyperlipidemia, unspecified: Secondary | ICD-10-CM | POA: Diagnosis not present

## 2021-02-01 DIAGNOSIS — E668 Other obesity: Secondary | ICD-10-CM | POA: Diagnosis not present

## 2021-02-01 DIAGNOSIS — N185 Chronic kidney disease, stage 5: Secondary | ICD-10-CM | POA: Diagnosis not present

## 2021-02-01 DIAGNOSIS — Z992 Dependence on renal dialysis: Secondary | ICD-10-CM | POA: Diagnosis not present

## 2021-02-01 DIAGNOSIS — N2581 Secondary hyperparathyroidism of renal origin: Secondary | ICD-10-CM | POA: Diagnosis not present

## 2021-02-01 DIAGNOSIS — M5431 Sciatica, right side: Secondary | ICD-10-CM | POA: Diagnosis not present

## 2021-02-01 DIAGNOSIS — D631 Anemia in chronic kidney disease: Secondary | ICD-10-CM | POA: Diagnosis not present

## 2021-02-01 DIAGNOSIS — N186 End stage renal disease: Secondary | ICD-10-CM | POA: Diagnosis not present

## 2021-02-02 DIAGNOSIS — E785 Hyperlipidemia, unspecified: Secondary | ICD-10-CM | POA: Diagnosis not present

## 2021-02-02 DIAGNOSIS — N186 End stage renal disease: Secondary | ICD-10-CM | POA: Diagnosis not present

## 2021-02-02 DIAGNOSIS — Z992 Dependence on renal dialysis: Secondary | ICD-10-CM | POA: Diagnosis not present

## 2021-02-02 DIAGNOSIS — D631 Anemia in chronic kidney disease: Secondary | ICD-10-CM | POA: Diagnosis not present

## 2021-02-02 DIAGNOSIS — N2581 Secondary hyperparathyroidism of renal origin: Secondary | ICD-10-CM | POA: Diagnosis not present

## 2021-02-03 DIAGNOSIS — Z992 Dependence on renal dialysis: Secondary | ICD-10-CM | POA: Diagnosis not present

## 2021-02-03 DIAGNOSIS — N186 End stage renal disease: Secondary | ICD-10-CM | POA: Diagnosis not present

## 2021-02-03 DIAGNOSIS — D631 Anemia in chronic kidney disease: Secondary | ICD-10-CM | POA: Diagnosis not present

## 2021-02-03 DIAGNOSIS — E785 Hyperlipidemia, unspecified: Secondary | ICD-10-CM | POA: Diagnosis not present

## 2021-02-03 DIAGNOSIS — N2581 Secondary hyperparathyroidism of renal origin: Secondary | ICD-10-CM | POA: Diagnosis not present

## 2021-02-04 DIAGNOSIS — E785 Hyperlipidemia, unspecified: Secondary | ICD-10-CM | POA: Diagnosis not present

## 2021-02-04 DIAGNOSIS — D631 Anemia in chronic kidney disease: Secondary | ICD-10-CM | POA: Diagnosis not present

## 2021-02-04 DIAGNOSIS — N186 End stage renal disease: Secondary | ICD-10-CM | POA: Diagnosis not present

## 2021-02-04 DIAGNOSIS — Z992 Dependence on renal dialysis: Secondary | ICD-10-CM | POA: Diagnosis not present

## 2021-02-04 DIAGNOSIS — N2581 Secondary hyperparathyroidism of renal origin: Secondary | ICD-10-CM | POA: Diagnosis not present

## 2021-02-05 DIAGNOSIS — D631 Anemia in chronic kidney disease: Secondary | ICD-10-CM | POA: Diagnosis not present

## 2021-02-05 DIAGNOSIS — N186 End stage renal disease: Secondary | ICD-10-CM | POA: Diagnosis not present

## 2021-02-05 DIAGNOSIS — E785 Hyperlipidemia, unspecified: Secondary | ICD-10-CM | POA: Diagnosis not present

## 2021-02-05 DIAGNOSIS — N2581 Secondary hyperparathyroidism of renal origin: Secondary | ICD-10-CM | POA: Diagnosis not present

## 2021-02-05 DIAGNOSIS — Z992 Dependence on renal dialysis: Secondary | ICD-10-CM | POA: Diagnosis not present

## 2021-02-06 DIAGNOSIS — N186 End stage renal disease: Secondary | ICD-10-CM | POA: Diagnosis not present

## 2021-02-06 DIAGNOSIS — N2581 Secondary hyperparathyroidism of renal origin: Secondary | ICD-10-CM | POA: Diagnosis not present

## 2021-02-06 DIAGNOSIS — D631 Anemia in chronic kidney disease: Secondary | ICD-10-CM | POA: Diagnosis not present

## 2021-02-06 DIAGNOSIS — E785 Hyperlipidemia, unspecified: Secondary | ICD-10-CM | POA: Diagnosis not present

## 2021-02-06 DIAGNOSIS — Z992 Dependence on renal dialysis: Secondary | ICD-10-CM | POA: Diagnosis not present

## 2021-02-07 DIAGNOSIS — E785 Hyperlipidemia, unspecified: Secondary | ICD-10-CM | POA: Diagnosis not present

## 2021-02-07 DIAGNOSIS — Z992 Dependence on renal dialysis: Secondary | ICD-10-CM | POA: Diagnosis not present

## 2021-02-07 DIAGNOSIS — N186 End stage renal disease: Secondary | ICD-10-CM | POA: Diagnosis not present

## 2021-02-07 DIAGNOSIS — D631 Anemia in chronic kidney disease: Secondary | ICD-10-CM | POA: Diagnosis not present

## 2021-02-07 DIAGNOSIS — N2581 Secondary hyperparathyroidism of renal origin: Secondary | ICD-10-CM | POA: Diagnosis not present

## 2021-02-08 DIAGNOSIS — N2581 Secondary hyperparathyroidism of renal origin: Secondary | ICD-10-CM | POA: Diagnosis not present

## 2021-02-08 DIAGNOSIS — E785 Hyperlipidemia, unspecified: Secondary | ICD-10-CM | POA: Diagnosis not present

## 2021-02-08 DIAGNOSIS — N186 End stage renal disease: Secondary | ICD-10-CM | POA: Diagnosis not present

## 2021-02-08 DIAGNOSIS — D631 Anemia in chronic kidney disease: Secondary | ICD-10-CM | POA: Diagnosis not present

## 2021-02-08 DIAGNOSIS — Z992 Dependence on renal dialysis: Secondary | ICD-10-CM | POA: Diagnosis not present

## 2021-02-09 DIAGNOSIS — N2581 Secondary hyperparathyroidism of renal origin: Secondary | ICD-10-CM | POA: Diagnosis not present

## 2021-02-09 DIAGNOSIS — N186 End stage renal disease: Secondary | ICD-10-CM | POA: Diagnosis not present

## 2021-02-09 DIAGNOSIS — Z992 Dependence on renal dialysis: Secondary | ICD-10-CM | POA: Diagnosis not present

## 2021-02-09 DIAGNOSIS — D631 Anemia in chronic kidney disease: Secondary | ICD-10-CM | POA: Diagnosis not present

## 2021-02-09 DIAGNOSIS — E785 Hyperlipidemia, unspecified: Secondary | ICD-10-CM | POA: Diagnosis not present

## 2021-02-09 DIAGNOSIS — G4733 Obstructive sleep apnea (adult) (pediatric): Secondary | ICD-10-CM | POA: Diagnosis not present

## 2021-02-10 DIAGNOSIS — D631 Anemia in chronic kidney disease: Secondary | ICD-10-CM | POA: Diagnosis not present

## 2021-02-10 DIAGNOSIS — N2581 Secondary hyperparathyroidism of renal origin: Secondary | ICD-10-CM | POA: Diagnosis not present

## 2021-02-10 DIAGNOSIS — N186 End stage renal disease: Secondary | ICD-10-CM | POA: Diagnosis not present

## 2021-02-10 DIAGNOSIS — E785 Hyperlipidemia, unspecified: Secondary | ICD-10-CM | POA: Diagnosis not present

## 2021-02-10 DIAGNOSIS — Z992 Dependence on renal dialysis: Secondary | ICD-10-CM | POA: Diagnosis not present

## 2021-02-11 DIAGNOSIS — N2581 Secondary hyperparathyroidism of renal origin: Secondary | ICD-10-CM | POA: Diagnosis not present

## 2021-02-11 DIAGNOSIS — Z992 Dependence on renal dialysis: Secondary | ICD-10-CM | POA: Diagnosis not present

## 2021-02-11 DIAGNOSIS — E785 Hyperlipidemia, unspecified: Secondary | ICD-10-CM | POA: Diagnosis not present

## 2021-02-11 DIAGNOSIS — N186 End stage renal disease: Secondary | ICD-10-CM | POA: Diagnosis not present

## 2021-02-11 DIAGNOSIS — D631 Anemia in chronic kidney disease: Secondary | ICD-10-CM | POA: Diagnosis not present

## 2021-02-12 DIAGNOSIS — Z992 Dependence on renal dialysis: Secondary | ICD-10-CM | POA: Diagnosis not present

## 2021-02-12 DIAGNOSIS — N2581 Secondary hyperparathyroidism of renal origin: Secondary | ICD-10-CM | POA: Diagnosis not present

## 2021-02-12 DIAGNOSIS — N186 End stage renal disease: Secondary | ICD-10-CM | POA: Diagnosis not present

## 2021-02-12 DIAGNOSIS — D631 Anemia in chronic kidney disease: Secondary | ICD-10-CM | POA: Diagnosis not present

## 2021-02-12 DIAGNOSIS — E785 Hyperlipidemia, unspecified: Secondary | ICD-10-CM | POA: Diagnosis not present

## 2021-02-12 DIAGNOSIS — E1122 Type 2 diabetes mellitus with diabetic chronic kidney disease: Secondary | ICD-10-CM | POA: Diagnosis not present

## 2021-02-13 DIAGNOSIS — E785 Hyperlipidemia, unspecified: Secondary | ICD-10-CM | POA: Diagnosis not present

## 2021-02-13 DIAGNOSIS — D509 Iron deficiency anemia, unspecified: Secondary | ICD-10-CM | POA: Diagnosis not present

## 2021-02-13 DIAGNOSIS — N2581 Secondary hyperparathyroidism of renal origin: Secondary | ICD-10-CM | POA: Diagnosis not present

## 2021-02-13 DIAGNOSIS — D631 Anemia in chronic kidney disease: Secondary | ICD-10-CM | POA: Diagnosis not present

## 2021-02-13 DIAGNOSIS — Z992 Dependence on renal dialysis: Secondary | ICD-10-CM | POA: Diagnosis not present

## 2021-02-13 DIAGNOSIS — E1129 Type 2 diabetes mellitus with other diabetic kidney complication: Secondary | ICD-10-CM | POA: Diagnosis not present

## 2021-02-13 DIAGNOSIS — Z4932 Encounter for adequacy testing for peritoneal dialysis: Secondary | ICD-10-CM | POA: Diagnosis not present

## 2021-02-13 DIAGNOSIS — N186 End stage renal disease: Secondary | ICD-10-CM | POA: Diagnosis not present

## 2021-02-14 DIAGNOSIS — N2581 Secondary hyperparathyroidism of renal origin: Secondary | ICD-10-CM | POA: Diagnosis not present

## 2021-02-14 DIAGNOSIS — Z4932 Encounter for adequacy testing for peritoneal dialysis: Secondary | ICD-10-CM | POA: Diagnosis not present

## 2021-02-14 DIAGNOSIS — Z992 Dependence on renal dialysis: Secondary | ICD-10-CM | POA: Diagnosis not present

## 2021-02-14 DIAGNOSIS — E785 Hyperlipidemia, unspecified: Secondary | ICD-10-CM | POA: Diagnosis not present

## 2021-02-14 DIAGNOSIS — N186 End stage renal disease: Secondary | ICD-10-CM | POA: Diagnosis not present

## 2021-02-14 DIAGNOSIS — D509 Iron deficiency anemia, unspecified: Secondary | ICD-10-CM | POA: Diagnosis not present

## 2021-02-14 DIAGNOSIS — D631 Anemia in chronic kidney disease: Secondary | ICD-10-CM | POA: Diagnosis not present

## 2021-02-14 DIAGNOSIS — E1129 Type 2 diabetes mellitus with other diabetic kidney complication: Secondary | ICD-10-CM | POA: Diagnosis not present

## 2021-02-15 DIAGNOSIS — D631 Anemia in chronic kidney disease: Secondary | ICD-10-CM | POA: Diagnosis not present

## 2021-02-15 DIAGNOSIS — D509 Iron deficiency anemia, unspecified: Secondary | ICD-10-CM | POA: Diagnosis not present

## 2021-02-15 DIAGNOSIS — N186 End stage renal disease: Secondary | ICD-10-CM | POA: Diagnosis not present

## 2021-02-15 DIAGNOSIS — N2581 Secondary hyperparathyroidism of renal origin: Secondary | ICD-10-CM | POA: Diagnosis not present

## 2021-02-15 DIAGNOSIS — Z4932 Encounter for adequacy testing for peritoneal dialysis: Secondary | ICD-10-CM | POA: Diagnosis not present

## 2021-02-15 DIAGNOSIS — E1129 Type 2 diabetes mellitus with other diabetic kidney complication: Secondary | ICD-10-CM | POA: Diagnosis not present

## 2021-02-15 DIAGNOSIS — Z992 Dependence on renal dialysis: Secondary | ICD-10-CM | POA: Diagnosis not present

## 2021-02-15 DIAGNOSIS — E785 Hyperlipidemia, unspecified: Secondary | ICD-10-CM | POA: Diagnosis not present

## 2021-02-16 DIAGNOSIS — E1129 Type 2 diabetes mellitus with other diabetic kidney complication: Secondary | ICD-10-CM | POA: Diagnosis not present

## 2021-02-16 DIAGNOSIS — N186 End stage renal disease: Secondary | ICD-10-CM | POA: Diagnosis not present

## 2021-02-16 DIAGNOSIS — N2581 Secondary hyperparathyroidism of renal origin: Secondary | ICD-10-CM | POA: Diagnosis not present

## 2021-02-16 DIAGNOSIS — E785 Hyperlipidemia, unspecified: Secondary | ICD-10-CM | POA: Diagnosis not present

## 2021-02-16 DIAGNOSIS — D631 Anemia in chronic kidney disease: Secondary | ICD-10-CM | POA: Diagnosis not present

## 2021-02-16 DIAGNOSIS — Z4932 Encounter for adequacy testing for peritoneal dialysis: Secondary | ICD-10-CM | POA: Diagnosis not present

## 2021-02-16 DIAGNOSIS — Z992 Dependence on renal dialysis: Secondary | ICD-10-CM | POA: Diagnosis not present

## 2021-02-16 DIAGNOSIS — D509 Iron deficiency anemia, unspecified: Secondary | ICD-10-CM | POA: Diagnosis not present

## 2021-02-17 DIAGNOSIS — N186 End stage renal disease: Secondary | ICD-10-CM | POA: Diagnosis not present

## 2021-02-17 DIAGNOSIS — Z992 Dependence on renal dialysis: Secondary | ICD-10-CM | POA: Diagnosis not present

## 2021-02-17 DIAGNOSIS — N2581 Secondary hyperparathyroidism of renal origin: Secondary | ICD-10-CM | POA: Diagnosis not present

## 2021-02-17 DIAGNOSIS — E785 Hyperlipidemia, unspecified: Secondary | ICD-10-CM | POA: Diagnosis not present

## 2021-02-17 DIAGNOSIS — Z4932 Encounter for adequacy testing for peritoneal dialysis: Secondary | ICD-10-CM | POA: Diagnosis not present

## 2021-02-17 DIAGNOSIS — D509 Iron deficiency anemia, unspecified: Secondary | ICD-10-CM | POA: Diagnosis not present

## 2021-02-17 DIAGNOSIS — E1129 Type 2 diabetes mellitus with other diabetic kidney complication: Secondary | ICD-10-CM | POA: Diagnosis not present

## 2021-02-17 DIAGNOSIS — D631 Anemia in chronic kidney disease: Secondary | ICD-10-CM | POA: Diagnosis not present

## 2021-02-18 DIAGNOSIS — D509 Iron deficiency anemia, unspecified: Secondary | ICD-10-CM | POA: Diagnosis not present

## 2021-02-18 DIAGNOSIS — D631 Anemia in chronic kidney disease: Secondary | ICD-10-CM | POA: Diagnosis not present

## 2021-02-18 DIAGNOSIS — Z992 Dependence on renal dialysis: Secondary | ICD-10-CM | POA: Diagnosis not present

## 2021-02-18 DIAGNOSIS — N186 End stage renal disease: Secondary | ICD-10-CM | POA: Diagnosis not present

## 2021-02-18 DIAGNOSIS — Z4932 Encounter for adequacy testing for peritoneal dialysis: Secondary | ICD-10-CM | POA: Diagnosis not present

## 2021-02-18 DIAGNOSIS — N2581 Secondary hyperparathyroidism of renal origin: Secondary | ICD-10-CM | POA: Diagnosis not present

## 2021-02-18 DIAGNOSIS — E785 Hyperlipidemia, unspecified: Secondary | ICD-10-CM | POA: Diagnosis not present

## 2021-02-18 DIAGNOSIS — E1129 Type 2 diabetes mellitus with other diabetic kidney complication: Secondary | ICD-10-CM | POA: Diagnosis not present

## 2021-02-19 DIAGNOSIS — E785 Hyperlipidemia, unspecified: Secondary | ICD-10-CM | POA: Diagnosis not present

## 2021-02-19 DIAGNOSIS — E1129 Type 2 diabetes mellitus with other diabetic kidney complication: Secondary | ICD-10-CM | POA: Diagnosis not present

## 2021-02-19 DIAGNOSIS — N186 End stage renal disease: Secondary | ICD-10-CM | POA: Diagnosis not present

## 2021-02-19 DIAGNOSIS — Z992 Dependence on renal dialysis: Secondary | ICD-10-CM | POA: Diagnosis not present

## 2021-02-19 DIAGNOSIS — Z4932 Encounter for adequacy testing for peritoneal dialysis: Secondary | ICD-10-CM | POA: Diagnosis not present

## 2021-02-19 DIAGNOSIS — D509 Iron deficiency anemia, unspecified: Secondary | ICD-10-CM | POA: Diagnosis not present

## 2021-02-19 DIAGNOSIS — D631 Anemia in chronic kidney disease: Secondary | ICD-10-CM | POA: Diagnosis not present

## 2021-02-19 DIAGNOSIS — N2581 Secondary hyperparathyroidism of renal origin: Secondary | ICD-10-CM | POA: Diagnosis not present

## 2021-02-20 DIAGNOSIS — Z4932 Encounter for adequacy testing for peritoneal dialysis: Secondary | ICD-10-CM | POA: Diagnosis not present

## 2021-02-20 DIAGNOSIS — D509 Iron deficiency anemia, unspecified: Secondary | ICD-10-CM | POA: Diagnosis not present

## 2021-02-20 DIAGNOSIS — N2581 Secondary hyperparathyroidism of renal origin: Secondary | ICD-10-CM | POA: Diagnosis not present

## 2021-02-20 DIAGNOSIS — E1129 Type 2 diabetes mellitus with other diabetic kidney complication: Secondary | ICD-10-CM | POA: Diagnosis not present

## 2021-02-20 DIAGNOSIS — Z992 Dependence on renal dialysis: Secondary | ICD-10-CM | POA: Diagnosis not present

## 2021-02-20 DIAGNOSIS — E785 Hyperlipidemia, unspecified: Secondary | ICD-10-CM | POA: Diagnosis not present

## 2021-02-20 DIAGNOSIS — D631 Anemia in chronic kidney disease: Secondary | ICD-10-CM | POA: Diagnosis not present

## 2021-02-20 DIAGNOSIS — N186 End stage renal disease: Secondary | ICD-10-CM | POA: Diagnosis not present

## 2021-02-21 DIAGNOSIS — E785 Hyperlipidemia, unspecified: Secondary | ICD-10-CM | POA: Diagnosis not present

## 2021-02-21 DIAGNOSIS — E1129 Type 2 diabetes mellitus with other diabetic kidney complication: Secondary | ICD-10-CM | POA: Diagnosis not present

## 2021-02-21 DIAGNOSIS — D509 Iron deficiency anemia, unspecified: Secondary | ICD-10-CM | POA: Diagnosis not present

## 2021-02-21 DIAGNOSIS — D631 Anemia in chronic kidney disease: Secondary | ICD-10-CM | POA: Diagnosis not present

## 2021-02-21 DIAGNOSIS — Z4932 Encounter for adequacy testing for peritoneal dialysis: Secondary | ICD-10-CM | POA: Diagnosis not present

## 2021-02-21 DIAGNOSIS — Z992 Dependence on renal dialysis: Secondary | ICD-10-CM | POA: Diagnosis not present

## 2021-02-21 DIAGNOSIS — N2581 Secondary hyperparathyroidism of renal origin: Secondary | ICD-10-CM | POA: Diagnosis not present

## 2021-02-21 DIAGNOSIS — N186 End stage renal disease: Secondary | ICD-10-CM | POA: Diagnosis not present

## 2021-02-22 DIAGNOSIS — E785 Hyperlipidemia, unspecified: Secondary | ICD-10-CM | POA: Diagnosis not present

## 2021-02-22 DIAGNOSIS — D631 Anemia in chronic kidney disease: Secondary | ICD-10-CM | POA: Diagnosis not present

## 2021-02-22 DIAGNOSIS — Z992 Dependence on renal dialysis: Secondary | ICD-10-CM | POA: Diagnosis not present

## 2021-02-22 DIAGNOSIS — N2581 Secondary hyperparathyroidism of renal origin: Secondary | ICD-10-CM | POA: Diagnosis not present

## 2021-02-22 DIAGNOSIS — D509 Iron deficiency anemia, unspecified: Secondary | ICD-10-CM | POA: Diagnosis not present

## 2021-02-22 DIAGNOSIS — Z4932 Encounter for adequacy testing for peritoneal dialysis: Secondary | ICD-10-CM | POA: Diagnosis not present

## 2021-02-22 DIAGNOSIS — N186 End stage renal disease: Secondary | ICD-10-CM | POA: Diagnosis not present

## 2021-02-22 DIAGNOSIS — E1129 Type 2 diabetes mellitus with other diabetic kidney complication: Secondary | ICD-10-CM | POA: Diagnosis not present

## 2021-02-23 DIAGNOSIS — E1129 Type 2 diabetes mellitus with other diabetic kidney complication: Secondary | ICD-10-CM | POA: Diagnosis not present

## 2021-02-23 DIAGNOSIS — D631 Anemia in chronic kidney disease: Secondary | ICD-10-CM | POA: Diagnosis not present

## 2021-02-23 DIAGNOSIS — N2581 Secondary hyperparathyroidism of renal origin: Secondary | ICD-10-CM | POA: Diagnosis not present

## 2021-02-23 DIAGNOSIS — N186 End stage renal disease: Secondary | ICD-10-CM | POA: Diagnosis not present

## 2021-02-23 DIAGNOSIS — Z4932 Encounter for adequacy testing for peritoneal dialysis: Secondary | ICD-10-CM | POA: Diagnosis not present

## 2021-02-23 DIAGNOSIS — Z992 Dependence on renal dialysis: Secondary | ICD-10-CM | POA: Diagnosis not present

## 2021-02-23 DIAGNOSIS — D509 Iron deficiency anemia, unspecified: Secondary | ICD-10-CM | POA: Diagnosis not present

## 2021-02-23 DIAGNOSIS — E785 Hyperlipidemia, unspecified: Secondary | ICD-10-CM | POA: Diagnosis not present

## 2021-02-24 DIAGNOSIS — D509 Iron deficiency anemia, unspecified: Secondary | ICD-10-CM | POA: Diagnosis not present

## 2021-02-24 DIAGNOSIS — E1129 Type 2 diabetes mellitus with other diabetic kidney complication: Secondary | ICD-10-CM | POA: Diagnosis not present

## 2021-02-24 DIAGNOSIS — N186 End stage renal disease: Secondary | ICD-10-CM | POA: Diagnosis not present

## 2021-02-24 DIAGNOSIS — Z992 Dependence on renal dialysis: Secondary | ICD-10-CM | POA: Diagnosis not present

## 2021-02-24 DIAGNOSIS — Z4932 Encounter for adequacy testing for peritoneal dialysis: Secondary | ICD-10-CM | POA: Diagnosis not present

## 2021-02-24 DIAGNOSIS — D631 Anemia in chronic kidney disease: Secondary | ICD-10-CM | POA: Diagnosis not present

## 2021-02-24 DIAGNOSIS — E785 Hyperlipidemia, unspecified: Secondary | ICD-10-CM | POA: Diagnosis not present

## 2021-02-24 DIAGNOSIS — N2581 Secondary hyperparathyroidism of renal origin: Secondary | ICD-10-CM | POA: Diagnosis not present

## 2021-02-25 DIAGNOSIS — Z125 Encounter for screening for malignant neoplasm of prostate: Secondary | ICD-10-CM | POA: Diagnosis not present

## 2021-02-25 DIAGNOSIS — Z992 Dependence on renal dialysis: Secondary | ICD-10-CM | POA: Diagnosis not present

## 2021-02-25 DIAGNOSIS — D509 Iron deficiency anemia, unspecified: Secondary | ICD-10-CM | POA: Diagnosis not present

## 2021-02-25 DIAGNOSIS — Z4932 Encounter for adequacy testing for peritoneal dialysis: Secondary | ICD-10-CM | POA: Diagnosis not present

## 2021-02-25 DIAGNOSIS — E1129 Type 2 diabetes mellitus with other diabetic kidney complication: Secondary | ICD-10-CM | POA: Diagnosis not present

## 2021-02-25 DIAGNOSIS — E785 Hyperlipidemia, unspecified: Secondary | ICD-10-CM | POA: Diagnosis not present

## 2021-02-25 DIAGNOSIS — D631 Anemia in chronic kidney disease: Secondary | ICD-10-CM | POA: Diagnosis not present

## 2021-02-25 DIAGNOSIS — N186 End stage renal disease: Secondary | ICD-10-CM | POA: Diagnosis not present

## 2021-02-25 DIAGNOSIS — E291 Testicular hypofunction: Secondary | ICD-10-CM | POA: Diagnosis not present

## 2021-02-25 DIAGNOSIS — N2581 Secondary hyperparathyroidism of renal origin: Secondary | ICD-10-CM | POA: Diagnosis not present

## 2021-02-26 DIAGNOSIS — N2581 Secondary hyperparathyroidism of renal origin: Secondary | ICD-10-CM | POA: Diagnosis not present

## 2021-02-26 DIAGNOSIS — D631 Anemia in chronic kidney disease: Secondary | ICD-10-CM | POA: Diagnosis not present

## 2021-02-26 DIAGNOSIS — H532 Diplopia: Secondary | ICD-10-CM | POA: Diagnosis not present

## 2021-02-26 DIAGNOSIS — Z992 Dependence on renal dialysis: Secondary | ICD-10-CM | POA: Diagnosis not present

## 2021-02-26 DIAGNOSIS — Z4932 Encounter for adequacy testing for peritoneal dialysis: Secondary | ICD-10-CM | POA: Diagnosis not present

## 2021-02-26 DIAGNOSIS — N186 End stage renal disease: Secondary | ICD-10-CM | POA: Diagnosis not present

## 2021-02-26 DIAGNOSIS — H35371 Puckering of macula, right eye: Secondary | ICD-10-CM | POA: Diagnosis not present

## 2021-02-26 DIAGNOSIS — E785 Hyperlipidemia, unspecified: Secondary | ICD-10-CM | POA: Diagnosis not present

## 2021-02-26 DIAGNOSIS — D509 Iron deficiency anemia, unspecified: Secondary | ICD-10-CM | POA: Diagnosis not present

## 2021-02-26 DIAGNOSIS — E1129 Type 2 diabetes mellitus with other diabetic kidney complication: Secondary | ICD-10-CM | POA: Diagnosis not present

## 2021-02-27 DIAGNOSIS — E785 Hyperlipidemia, unspecified: Secondary | ICD-10-CM | POA: Diagnosis not present

## 2021-02-27 DIAGNOSIS — D631 Anemia in chronic kidney disease: Secondary | ICD-10-CM | POA: Diagnosis not present

## 2021-02-27 DIAGNOSIS — D509 Iron deficiency anemia, unspecified: Secondary | ICD-10-CM | POA: Diagnosis not present

## 2021-02-27 DIAGNOSIS — Z992 Dependence on renal dialysis: Secondary | ICD-10-CM | POA: Diagnosis not present

## 2021-02-27 DIAGNOSIS — N186 End stage renal disease: Secondary | ICD-10-CM | POA: Diagnosis not present

## 2021-02-27 DIAGNOSIS — Z4932 Encounter for adequacy testing for peritoneal dialysis: Secondary | ICD-10-CM | POA: Diagnosis not present

## 2021-02-27 DIAGNOSIS — N2581 Secondary hyperparathyroidism of renal origin: Secondary | ICD-10-CM | POA: Diagnosis not present

## 2021-02-27 DIAGNOSIS — E1129 Type 2 diabetes mellitus with other diabetic kidney complication: Secondary | ICD-10-CM | POA: Diagnosis not present

## 2021-02-28 DIAGNOSIS — D509 Iron deficiency anemia, unspecified: Secondary | ICD-10-CM | POA: Diagnosis not present

## 2021-02-28 DIAGNOSIS — D631 Anemia in chronic kidney disease: Secondary | ICD-10-CM | POA: Diagnosis not present

## 2021-02-28 DIAGNOSIS — E1129 Type 2 diabetes mellitus with other diabetic kidney complication: Secondary | ICD-10-CM | POA: Diagnosis not present

## 2021-02-28 DIAGNOSIS — Z4932 Encounter for adequacy testing for peritoneal dialysis: Secondary | ICD-10-CM | POA: Diagnosis not present

## 2021-02-28 DIAGNOSIS — N2581 Secondary hyperparathyroidism of renal origin: Secondary | ICD-10-CM | POA: Diagnosis not present

## 2021-02-28 DIAGNOSIS — Z992 Dependence on renal dialysis: Secondary | ICD-10-CM | POA: Diagnosis not present

## 2021-02-28 DIAGNOSIS — E785 Hyperlipidemia, unspecified: Secondary | ICD-10-CM | POA: Diagnosis not present

## 2021-02-28 DIAGNOSIS — N186 End stage renal disease: Secondary | ICD-10-CM | POA: Diagnosis not present

## 2021-03-01 DIAGNOSIS — E785 Hyperlipidemia, unspecified: Secondary | ICD-10-CM | POA: Diagnosis not present

## 2021-03-01 DIAGNOSIS — D509 Iron deficiency anemia, unspecified: Secondary | ICD-10-CM | POA: Diagnosis not present

## 2021-03-01 DIAGNOSIS — E1129 Type 2 diabetes mellitus with other diabetic kidney complication: Secondary | ICD-10-CM | POA: Diagnosis not present

## 2021-03-01 DIAGNOSIS — N186 End stage renal disease: Secondary | ICD-10-CM | POA: Diagnosis not present

## 2021-03-01 DIAGNOSIS — Z992 Dependence on renal dialysis: Secondary | ICD-10-CM | POA: Diagnosis not present

## 2021-03-01 DIAGNOSIS — D631 Anemia in chronic kidney disease: Secondary | ICD-10-CM | POA: Diagnosis not present

## 2021-03-01 DIAGNOSIS — N2581 Secondary hyperparathyroidism of renal origin: Secondary | ICD-10-CM | POA: Diagnosis not present

## 2021-03-01 DIAGNOSIS — Z4932 Encounter for adequacy testing for peritoneal dialysis: Secondary | ICD-10-CM | POA: Diagnosis not present

## 2021-03-02 DIAGNOSIS — E1129 Type 2 diabetes mellitus with other diabetic kidney complication: Secondary | ICD-10-CM | POA: Diagnosis not present

## 2021-03-02 DIAGNOSIS — E291 Testicular hypofunction: Secondary | ICD-10-CM | POA: Diagnosis not present

## 2021-03-02 DIAGNOSIS — D631 Anemia in chronic kidney disease: Secondary | ICD-10-CM | POA: Diagnosis not present

## 2021-03-02 DIAGNOSIS — N186 End stage renal disease: Secondary | ICD-10-CM | POA: Diagnosis not present

## 2021-03-02 DIAGNOSIS — E785 Hyperlipidemia, unspecified: Secondary | ICD-10-CM | POA: Diagnosis not present

## 2021-03-02 DIAGNOSIS — Z4932 Encounter for adequacy testing for peritoneal dialysis: Secondary | ICD-10-CM | POA: Diagnosis not present

## 2021-03-02 DIAGNOSIS — Z6841 Body Mass Index (BMI) 40.0 and over, adult: Secondary | ICD-10-CM | POA: Diagnosis not present

## 2021-03-02 DIAGNOSIS — N2581 Secondary hyperparathyroidism of renal origin: Secondary | ICD-10-CM | POA: Diagnosis not present

## 2021-03-02 DIAGNOSIS — D509 Iron deficiency anemia, unspecified: Secondary | ICD-10-CM | POA: Diagnosis not present

## 2021-03-02 DIAGNOSIS — Z992 Dependence on renal dialysis: Secondary | ICD-10-CM | POA: Diagnosis not present

## 2021-03-03 DIAGNOSIS — E785 Hyperlipidemia, unspecified: Secondary | ICD-10-CM | POA: Diagnosis not present

## 2021-03-03 DIAGNOSIS — N186 End stage renal disease: Secondary | ICD-10-CM | POA: Diagnosis not present

## 2021-03-03 DIAGNOSIS — Z4932 Encounter for adequacy testing for peritoneal dialysis: Secondary | ICD-10-CM | POA: Diagnosis not present

## 2021-03-03 DIAGNOSIS — E1129 Type 2 diabetes mellitus with other diabetic kidney complication: Secondary | ICD-10-CM | POA: Diagnosis not present

## 2021-03-03 DIAGNOSIS — N2581 Secondary hyperparathyroidism of renal origin: Secondary | ICD-10-CM | POA: Diagnosis not present

## 2021-03-03 DIAGNOSIS — Z992 Dependence on renal dialysis: Secondary | ICD-10-CM | POA: Diagnosis not present

## 2021-03-03 DIAGNOSIS — D509 Iron deficiency anemia, unspecified: Secondary | ICD-10-CM | POA: Diagnosis not present

## 2021-03-03 DIAGNOSIS — D631 Anemia in chronic kidney disease: Secondary | ICD-10-CM | POA: Diagnosis not present

## 2021-03-04 DIAGNOSIS — Z4932 Encounter for adequacy testing for peritoneal dialysis: Secondary | ICD-10-CM | POA: Diagnosis not present

## 2021-03-04 DIAGNOSIS — Z992 Dependence on renal dialysis: Secondary | ICD-10-CM | POA: Diagnosis not present

## 2021-03-04 DIAGNOSIS — E785 Hyperlipidemia, unspecified: Secondary | ICD-10-CM | POA: Diagnosis not present

## 2021-03-04 DIAGNOSIS — N186 End stage renal disease: Secondary | ICD-10-CM | POA: Diagnosis not present

## 2021-03-04 DIAGNOSIS — D631 Anemia in chronic kidney disease: Secondary | ICD-10-CM | POA: Diagnosis not present

## 2021-03-04 DIAGNOSIS — N2581 Secondary hyperparathyroidism of renal origin: Secondary | ICD-10-CM | POA: Diagnosis not present

## 2021-03-04 DIAGNOSIS — D509 Iron deficiency anemia, unspecified: Secondary | ICD-10-CM | POA: Diagnosis not present

## 2021-03-04 DIAGNOSIS — E1129 Type 2 diabetes mellitus with other diabetic kidney complication: Secondary | ICD-10-CM | POA: Diagnosis not present

## 2021-03-05 DIAGNOSIS — Z4932 Encounter for adequacy testing for peritoneal dialysis: Secondary | ICD-10-CM | POA: Diagnosis not present

## 2021-03-05 DIAGNOSIS — N2581 Secondary hyperparathyroidism of renal origin: Secondary | ICD-10-CM | POA: Diagnosis not present

## 2021-03-05 DIAGNOSIS — E1129 Type 2 diabetes mellitus with other diabetic kidney complication: Secondary | ICD-10-CM | POA: Diagnosis not present

## 2021-03-05 DIAGNOSIS — E785 Hyperlipidemia, unspecified: Secondary | ICD-10-CM | POA: Diagnosis not present

## 2021-03-05 DIAGNOSIS — D631 Anemia in chronic kidney disease: Secondary | ICD-10-CM | POA: Diagnosis not present

## 2021-03-05 DIAGNOSIS — N186 End stage renal disease: Secondary | ICD-10-CM | POA: Diagnosis not present

## 2021-03-05 DIAGNOSIS — D509 Iron deficiency anemia, unspecified: Secondary | ICD-10-CM | POA: Diagnosis not present

## 2021-03-05 DIAGNOSIS — Z992 Dependence on renal dialysis: Secondary | ICD-10-CM | POA: Diagnosis not present

## 2021-03-05 DIAGNOSIS — H532 Diplopia: Secondary | ICD-10-CM | POA: Diagnosis not present

## 2021-03-06 DIAGNOSIS — E1129 Type 2 diabetes mellitus with other diabetic kidney complication: Secondary | ICD-10-CM | POA: Diagnosis not present

## 2021-03-06 DIAGNOSIS — E785 Hyperlipidemia, unspecified: Secondary | ICD-10-CM | POA: Diagnosis not present

## 2021-03-06 DIAGNOSIS — N2581 Secondary hyperparathyroidism of renal origin: Secondary | ICD-10-CM | POA: Diagnosis not present

## 2021-03-06 DIAGNOSIS — N186 End stage renal disease: Secondary | ICD-10-CM | POA: Diagnosis not present

## 2021-03-06 DIAGNOSIS — Z992 Dependence on renal dialysis: Secondary | ICD-10-CM | POA: Diagnosis not present

## 2021-03-06 DIAGNOSIS — Z4932 Encounter for adequacy testing for peritoneal dialysis: Secondary | ICD-10-CM | POA: Diagnosis not present

## 2021-03-06 DIAGNOSIS — D509 Iron deficiency anemia, unspecified: Secondary | ICD-10-CM | POA: Diagnosis not present

## 2021-03-06 DIAGNOSIS — D631 Anemia in chronic kidney disease: Secondary | ICD-10-CM | POA: Diagnosis not present

## 2021-03-07 DIAGNOSIS — Z4932 Encounter for adequacy testing for peritoneal dialysis: Secondary | ICD-10-CM | POA: Diagnosis not present

## 2021-03-07 DIAGNOSIS — D509 Iron deficiency anemia, unspecified: Secondary | ICD-10-CM | POA: Diagnosis not present

## 2021-03-07 DIAGNOSIS — N186 End stage renal disease: Secondary | ICD-10-CM | POA: Diagnosis not present

## 2021-03-07 DIAGNOSIS — E785 Hyperlipidemia, unspecified: Secondary | ICD-10-CM | POA: Diagnosis not present

## 2021-03-07 DIAGNOSIS — Z992 Dependence on renal dialysis: Secondary | ICD-10-CM | POA: Diagnosis not present

## 2021-03-07 DIAGNOSIS — E1129 Type 2 diabetes mellitus with other diabetic kidney complication: Secondary | ICD-10-CM | POA: Diagnosis not present

## 2021-03-07 DIAGNOSIS — N2581 Secondary hyperparathyroidism of renal origin: Secondary | ICD-10-CM | POA: Diagnosis not present

## 2021-03-07 DIAGNOSIS — D631 Anemia in chronic kidney disease: Secondary | ICD-10-CM | POA: Diagnosis not present

## 2021-03-08 DIAGNOSIS — N2581 Secondary hyperparathyroidism of renal origin: Secondary | ICD-10-CM | POA: Diagnosis not present

## 2021-03-08 DIAGNOSIS — D631 Anemia in chronic kidney disease: Secondary | ICD-10-CM | POA: Diagnosis not present

## 2021-03-08 DIAGNOSIS — Z992 Dependence on renal dialysis: Secondary | ICD-10-CM | POA: Diagnosis not present

## 2021-03-08 DIAGNOSIS — N186 End stage renal disease: Secondary | ICD-10-CM | POA: Diagnosis not present

## 2021-03-08 DIAGNOSIS — Z4932 Encounter for adequacy testing for peritoneal dialysis: Secondary | ICD-10-CM | POA: Diagnosis not present

## 2021-03-08 DIAGNOSIS — E785 Hyperlipidemia, unspecified: Secondary | ICD-10-CM | POA: Diagnosis not present

## 2021-03-08 DIAGNOSIS — E1129 Type 2 diabetes mellitus with other diabetic kidney complication: Secondary | ICD-10-CM | POA: Diagnosis not present

## 2021-03-08 DIAGNOSIS — D509 Iron deficiency anemia, unspecified: Secondary | ICD-10-CM | POA: Diagnosis not present

## 2021-03-09 DIAGNOSIS — N186 End stage renal disease: Secondary | ICD-10-CM | POA: Diagnosis not present

## 2021-03-09 DIAGNOSIS — E1129 Type 2 diabetes mellitus with other diabetic kidney complication: Secondary | ICD-10-CM | POA: Diagnosis not present

## 2021-03-09 DIAGNOSIS — Z4932 Encounter for adequacy testing for peritoneal dialysis: Secondary | ICD-10-CM | POA: Diagnosis not present

## 2021-03-09 DIAGNOSIS — Z992 Dependence on renal dialysis: Secondary | ICD-10-CM | POA: Diagnosis not present

## 2021-03-09 DIAGNOSIS — D509 Iron deficiency anemia, unspecified: Secondary | ICD-10-CM | POA: Diagnosis not present

## 2021-03-09 DIAGNOSIS — N2581 Secondary hyperparathyroidism of renal origin: Secondary | ICD-10-CM | POA: Diagnosis not present

## 2021-03-09 DIAGNOSIS — E785 Hyperlipidemia, unspecified: Secondary | ICD-10-CM | POA: Diagnosis not present

## 2021-03-09 DIAGNOSIS — D631 Anemia in chronic kidney disease: Secondary | ICD-10-CM | POA: Diagnosis not present

## 2021-03-09 DIAGNOSIS — E291 Testicular hypofunction: Secondary | ICD-10-CM | POA: Diagnosis not present

## 2021-03-10 DIAGNOSIS — E785 Hyperlipidemia, unspecified: Secondary | ICD-10-CM | POA: Diagnosis not present

## 2021-03-10 DIAGNOSIS — N186 End stage renal disease: Secondary | ICD-10-CM | POA: Diagnosis not present

## 2021-03-10 DIAGNOSIS — Z4932 Encounter for adequacy testing for peritoneal dialysis: Secondary | ICD-10-CM | POA: Diagnosis not present

## 2021-03-10 DIAGNOSIS — Z992 Dependence on renal dialysis: Secondary | ICD-10-CM | POA: Diagnosis not present

## 2021-03-10 DIAGNOSIS — N2581 Secondary hyperparathyroidism of renal origin: Secondary | ICD-10-CM | POA: Diagnosis not present

## 2021-03-10 DIAGNOSIS — D509 Iron deficiency anemia, unspecified: Secondary | ICD-10-CM | POA: Diagnosis not present

## 2021-03-10 DIAGNOSIS — D631 Anemia in chronic kidney disease: Secondary | ICD-10-CM | POA: Diagnosis not present

## 2021-03-10 DIAGNOSIS — E1129 Type 2 diabetes mellitus with other diabetic kidney complication: Secondary | ICD-10-CM | POA: Diagnosis not present

## 2021-03-11 DIAGNOSIS — Z992 Dependence on renal dialysis: Secondary | ICD-10-CM | POA: Diagnosis not present

## 2021-03-11 DIAGNOSIS — D631 Anemia in chronic kidney disease: Secondary | ICD-10-CM | POA: Diagnosis not present

## 2021-03-11 DIAGNOSIS — N186 End stage renal disease: Secondary | ICD-10-CM | POA: Diagnosis not present

## 2021-03-11 DIAGNOSIS — E1129 Type 2 diabetes mellitus with other diabetic kidney complication: Secondary | ICD-10-CM | POA: Diagnosis not present

## 2021-03-11 DIAGNOSIS — Z4932 Encounter for adequacy testing for peritoneal dialysis: Secondary | ICD-10-CM | POA: Diagnosis not present

## 2021-03-11 DIAGNOSIS — E785 Hyperlipidemia, unspecified: Secondary | ICD-10-CM | POA: Diagnosis not present

## 2021-03-11 DIAGNOSIS — G4733 Obstructive sleep apnea (adult) (pediatric): Secondary | ICD-10-CM | POA: Diagnosis not present

## 2021-03-11 DIAGNOSIS — N2581 Secondary hyperparathyroidism of renal origin: Secondary | ICD-10-CM | POA: Diagnosis not present

## 2021-03-11 DIAGNOSIS — D509 Iron deficiency anemia, unspecified: Secondary | ICD-10-CM | POA: Diagnosis not present

## 2021-03-12 DIAGNOSIS — E1129 Type 2 diabetes mellitus with other diabetic kidney complication: Secondary | ICD-10-CM | POA: Diagnosis not present

## 2021-03-12 DIAGNOSIS — Z992 Dependence on renal dialysis: Secondary | ICD-10-CM | POA: Diagnosis not present

## 2021-03-12 DIAGNOSIS — N2581 Secondary hyperparathyroidism of renal origin: Secondary | ICD-10-CM | POA: Diagnosis not present

## 2021-03-12 DIAGNOSIS — E785 Hyperlipidemia, unspecified: Secondary | ICD-10-CM | POA: Diagnosis not present

## 2021-03-12 DIAGNOSIS — D509 Iron deficiency anemia, unspecified: Secondary | ICD-10-CM | POA: Diagnosis not present

## 2021-03-12 DIAGNOSIS — Z4932 Encounter for adequacy testing for peritoneal dialysis: Secondary | ICD-10-CM | POA: Diagnosis not present

## 2021-03-12 DIAGNOSIS — D631 Anemia in chronic kidney disease: Secondary | ICD-10-CM | POA: Diagnosis not present

## 2021-03-12 DIAGNOSIS — N186 End stage renal disease: Secondary | ICD-10-CM | POA: Diagnosis not present

## 2021-03-13 DIAGNOSIS — E1129 Type 2 diabetes mellitus with other diabetic kidney complication: Secondary | ICD-10-CM | POA: Diagnosis not present

## 2021-03-13 DIAGNOSIS — N2581 Secondary hyperparathyroidism of renal origin: Secondary | ICD-10-CM | POA: Diagnosis not present

## 2021-03-13 DIAGNOSIS — Z992 Dependence on renal dialysis: Secondary | ICD-10-CM | POA: Diagnosis not present

## 2021-03-13 DIAGNOSIS — E785 Hyperlipidemia, unspecified: Secondary | ICD-10-CM | POA: Diagnosis not present

## 2021-03-13 DIAGNOSIS — D631 Anemia in chronic kidney disease: Secondary | ICD-10-CM | POA: Diagnosis not present

## 2021-03-13 DIAGNOSIS — D509 Iron deficiency anemia, unspecified: Secondary | ICD-10-CM | POA: Diagnosis not present

## 2021-03-13 DIAGNOSIS — Z4932 Encounter for adequacy testing for peritoneal dialysis: Secondary | ICD-10-CM | POA: Diagnosis not present

## 2021-03-13 DIAGNOSIS — N186 End stage renal disease: Secondary | ICD-10-CM | POA: Diagnosis not present

## 2021-03-14 DIAGNOSIS — Z4932 Encounter for adequacy testing for peritoneal dialysis: Secondary | ICD-10-CM | POA: Diagnosis not present

## 2021-03-14 DIAGNOSIS — N2581 Secondary hyperparathyroidism of renal origin: Secondary | ICD-10-CM | POA: Diagnosis not present

## 2021-03-14 DIAGNOSIS — E785 Hyperlipidemia, unspecified: Secondary | ICD-10-CM | POA: Diagnosis not present

## 2021-03-14 DIAGNOSIS — D631 Anemia in chronic kidney disease: Secondary | ICD-10-CM | POA: Diagnosis not present

## 2021-03-14 DIAGNOSIS — E1129 Type 2 diabetes mellitus with other diabetic kidney complication: Secondary | ICD-10-CM | POA: Diagnosis not present

## 2021-03-14 DIAGNOSIS — D509 Iron deficiency anemia, unspecified: Secondary | ICD-10-CM | POA: Diagnosis not present

## 2021-03-14 DIAGNOSIS — N186 End stage renal disease: Secondary | ICD-10-CM | POA: Diagnosis not present

## 2021-03-14 DIAGNOSIS — Z992 Dependence on renal dialysis: Secondary | ICD-10-CM | POA: Diagnosis not present

## 2021-03-15 DIAGNOSIS — E785 Hyperlipidemia, unspecified: Secondary | ICD-10-CM | POA: Diagnosis not present

## 2021-03-15 DIAGNOSIS — Z992 Dependence on renal dialysis: Secondary | ICD-10-CM | POA: Diagnosis not present

## 2021-03-15 DIAGNOSIS — Z4932 Encounter for adequacy testing for peritoneal dialysis: Secondary | ICD-10-CM | POA: Diagnosis not present

## 2021-03-15 DIAGNOSIS — E1122 Type 2 diabetes mellitus with diabetic chronic kidney disease: Secondary | ICD-10-CM | POA: Diagnosis not present

## 2021-03-15 DIAGNOSIS — N186 End stage renal disease: Secondary | ICD-10-CM | POA: Diagnosis not present

## 2021-03-15 DIAGNOSIS — D509 Iron deficiency anemia, unspecified: Secondary | ICD-10-CM | POA: Diagnosis not present

## 2021-03-15 DIAGNOSIS — D631 Anemia in chronic kidney disease: Secondary | ICD-10-CM | POA: Diagnosis not present

## 2021-03-15 DIAGNOSIS — N2581 Secondary hyperparathyroidism of renal origin: Secondary | ICD-10-CM | POA: Diagnosis not present

## 2021-03-15 DIAGNOSIS — E1129 Type 2 diabetes mellitus with other diabetic kidney complication: Secondary | ICD-10-CM | POA: Diagnosis not present

## 2021-03-16 DIAGNOSIS — N2581 Secondary hyperparathyroidism of renal origin: Secondary | ICD-10-CM | POA: Diagnosis not present

## 2021-03-16 DIAGNOSIS — N186 End stage renal disease: Secondary | ICD-10-CM | POA: Diagnosis not present

## 2021-03-16 DIAGNOSIS — E291 Testicular hypofunction: Secondary | ICD-10-CM | POA: Diagnosis not present

## 2021-03-16 DIAGNOSIS — Z992 Dependence on renal dialysis: Secondary | ICD-10-CM | POA: Diagnosis not present

## 2021-03-16 DIAGNOSIS — D631 Anemia in chronic kidney disease: Secondary | ICD-10-CM | POA: Diagnosis not present

## 2021-03-16 DIAGNOSIS — E785 Hyperlipidemia, unspecified: Secondary | ICD-10-CM | POA: Diagnosis not present

## 2021-03-17 DIAGNOSIS — N186 End stage renal disease: Secondary | ICD-10-CM | POA: Diagnosis not present

## 2021-03-17 DIAGNOSIS — D631 Anemia in chronic kidney disease: Secondary | ICD-10-CM | POA: Diagnosis not present

## 2021-03-17 DIAGNOSIS — N2581 Secondary hyperparathyroidism of renal origin: Secondary | ICD-10-CM | POA: Diagnosis not present

## 2021-03-17 DIAGNOSIS — E785 Hyperlipidemia, unspecified: Secondary | ICD-10-CM | POA: Diagnosis not present

## 2021-03-17 DIAGNOSIS — H524 Presbyopia: Secondary | ICD-10-CM | POA: Diagnosis not present

## 2021-03-17 DIAGNOSIS — Z992 Dependence on renal dialysis: Secondary | ICD-10-CM | POA: Diagnosis not present

## 2021-03-18 DIAGNOSIS — D631 Anemia in chronic kidney disease: Secondary | ICD-10-CM | POA: Diagnosis not present

## 2021-03-18 DIAGNOSIS — E785 Hyperlipidemia, unspecified: Secondary | ICD-10-CM | POA: Diagnosis not present

## 2021-03-18 DIAGNOSIS — Z992 Dependence on renal dialysis: Secondary | ICD-10-CM | POA: Diagnosis not present

## 2021-03-18 DIAGNOSIS — N2581 Secondary hyperparathyroidism of renal origin: Secondary | ICD-10-CM | POA: Diagnosis not present

## 2021-03-18 DIAGNOSIS — N186 End stage renal disease: Secondary | ICD-10-CM | POA: Diagnosis not present

## 2021-03-19 DIAGNOSIS — Z992 Dependence on renal dialysis: Secondary | ICD-10-CM | POA: Diagnosis not present

## 2021-03-19 DIAGNOSIS — N2581 Secondary hyperparathyroidism of renal origin: Secondary | ICD-10-CM | POA: Diagnosis not present

## 2021-03-19 DIAGNOSIS — E785 Hyperlipidemia, unspecified: Secondary | ICD-10-CM | POA: Diagnosis not present

## 2021-03-19 DIAGNOSIS — N186 End stage renal disease: Secondary | ICD-10-CM | POA: Diagnosis not present

## 2021-03-19 DIAGNOSIS — D631 Anemia in chronic kidney disease: Secondary | ICD-10-CM | POA: Diagnosis not present

## 2021-03-20 DIAGNOSIS — N2581 Secondary hyperparathyroidism of renal origin: Secondary | ICD-10-CM | POA: Diagnosis not present

## 2021-03-20 DIAGNOSIS — E785 Hyperlipidemia, unspecified: Secondary | ICD-10-CM | POA: Diagnosis not present

## 2021-03-20 DIAGNOSIS — D631 Anemia in chronic kidney disease: Secondary | ICD-10-CM | POA: Diagnosis not present

## 2021-03-20 DIAGNOSIS — N186 End stage renal disease: Secondary | ICD-10-CM | POA: Diagnosis not present

## 2021-03-20 DIAGNOSIS — Z992 Dependence on renal dialysis: Secondary | ICD-10-CM | POA: Diagnosis not present

## 2021-03-21 DIAGNOSIS — E785 Hyperlipidemia, unspecified: Secondary | ICD-10-CM | POA: Diagnosis not present

## 2021-03-21 DIAGNOSIS — N2581 Secondary hyperparathyroidism of renal origin: Secondary | ICD-10-CM | POA: Diagnosis not present

## 2021-03-21 DIAGNOSIS — Z992 Dependence on renal dialysis: Secondary | ICD-10-CM | POA: Diagnosis not present

## 2021-03-21 DIAGNOSIS — N186 End stage renal disease: Secondary | ICD-10-CM | POA: Diagnosis not present

## 2021-03-21 DIAGNOSIS — D631 Anemia in chronic kidney disease: Secondary | ICD-10-CM | POA: Diagnosis not present

## 2021-03-22 DIAGNOSIS — D631 Anemia in chronic kidney disease: Secondary | ICD-10-CM | POA: Diagnosis not present

## 2021-03-22 DIAGNOSIS — Z992 Dependence on renal dialysis: Secondary | ICD-10-CM | POA: Diagnosis not present

## 2021-03-22 DIAGNOSIS — E785 Hyperlipidemia, unspecified: Secondary | ICD-10-CM | POA: Diagnosis not present

## 2021-03-22 DIAGNOSIS — N186 End stage renal disease: Secondary | ICD-10-CM | POA: Diagnosis not present

## 2021-03-22 DIAGNOSIS — N2581 Secondary hyperparathyroidism of renal origin: Secondary | ICD-10-CM | POA: Diagnosis not present

## 2021-03-23 DIAGNOSIS — N186 End stage renal disease: Secondary | ICD-10-CM | POA: Diagnosis not present

## 2021-03-23 DIAGNOSIS — Z992 Dependence on renal dialysis: Secondary | ICD-10-CM | POA: Diagnosis not present

## 2021-03-23 DIAGNOSIS — D631 Anemia in chronic kidney disease: Secondary | ICD-10-CM | POA: Diagnosis not present

## 2021-03-23 DIAGNOSIS — E785 Hyperlipidemia, unspecified: Secondary | ICD-10-CM | POA: Diagnosis not present

## 2021-03-23 DIAGNOSIS — N2581 Secondary hyperparathyroidism of renal origin: Secondary | ICD-10-CM | POA: Diagnosis not present

## 2021-03-24 DIAGNOSIS — N186 End stage renal disease: Secondary | ICD-10-CM | POA: Diagnosis not present

## 2021-03-24 DIAGNOSIS — D631 Anemia in chronic kidney disease: Secondary | ICD-10-CM | POA: Diagnosis not present

## 2021-03-24 DIAGNOSIS — N2581 Secondary hyperparathyroidism of renal origin: Secondary | ICD-10-CM | POA: Diagnosis not present

## 2021-03-24 DIAGNOSIS — Z992 Dependence on renal dialysis: Secondary | ICD-10-CM | POA: Diagnosis not present

## 2021-03-24 DIAGNOSIS — E785 Hyperlipidemia, unspecified: Secondary | ICD-10-CM | POA: Diagnosis not present

## 2021-03-25 DIAGNOSIS — N2581 Secondary hyperparathyroidism of renal origin: Secondary | ICD-10-CM | POA: Diagnosis not present

## 2021-03-25 DIAGNOSIS — Z992 Dependence on renal dialysis: Secondary | ICD-10-CM | POA: Diagnosis not present

## 2021-03-25 DIAGNOSIS — N186 End stage renal disease: Secondary | ICD-10-CM | POA: Diagnosis not present

## 2021-03-25 DIAGNOSIS — E785 Hyperlipidemia, unspecified: Secondary | ICD-10-CM | POA: Diagnosis not present

## 2021-03-25 DIAGNOSIS — D631 Anemia in chronic kidney disease: Secondary | ICD-10-CM | POA: Diagnosis not present

## 2021-03-26 DIAGNOSIS — N2581 Secondary hyperparathyroidism of renal origin: Secondary | ICD-10-CM | POA: Diagnosis not present

## 2021-03-26 DIAGNOSIS — N186 End stage renal disease: Secondary | ICD-10-CM | POA: Diagnosis not present

## 2021-03-26 DIAGNOSIS — E785 Hyperlipidemia, unspecified: Secondary | ICD-10-CM | POA: Diagnosis not present

## 2021-03-26 DIAGNOSIS — D631 Anemia in chronic kidney disease: Secondary | ICD-10-CM | POA: Diagnosis not present

## 2021-03-26 DIAGNOSIS — Z992 Dependence on renal dialysis: Secondary | ICD-10-CM | POA: Diagnosis not present

## 2021-03-27 DIAGNOSIS — E785 Hyperlipidemia, unspecified: Secondary | ICD-10-CM | POA: Diagnosis not present

## 2021-03-27 DIAGNOSIS — D631 Anemia in chronic kidney disease: Secondary | ICD-10-CM | POA: Diagnosis not present

## 2021-03-27 DIAGNOSIS — N186 End stage renal disease: Secondary | ICD-10-CM | POA: Diagnosis not present

## 2021-03-27 DIAGNOSIS — N2581 Secondary hyperparathyroidism of renal origin: Secondary | ICD-10-CM | POA: Diagnosis not present

## 2021-03-27 DIAGNOSIS — Z992 Dependence on renal dialysis: Secondary | ICD-10-CM | POA: Diagnosis not present

## 2021-03-28 DIAGNOSIS — N2581 Secondary hyperparathyroidism of renal origin: Secondary | ICD-10-CM | POA: Diagnosis not present

## 2021-03-28 DIAGNOSIS — E785 Hyperlipidemia, unspecified: Secondary | ICD-10-CM | POA: Diagnosis not present

## 2021-03-28 DIAGNOSIS — D631 Anemia in chronic kidney disease: Secondary | ICD-10-CM | POA: Diagnosis not present

## 2021-03-28 DIAGNOSIS — N186 End stage renal disease: Secondary | ICD-10-CM | POA: Diagnosis not present

## 2021-03-28 DIAGNOSIS — Z992 Dependence on renal dialysis: Secondary | ICD-10-CM | POA: Diagnosis not present

## 2021-03-29 DIAGNOSIS — D631 Anemia in chronic kidney disease: Secondary | ICD-10-CM | POA: Diagnosis not present

## 2021-03-29 DIAGNOSIS — N2581 Secondary hyperparathyroidism of renal origin: Secondary | ICD-10-CM | POA: Diagnosis not present

## 2021-03-29 DIAGNOSIS — Z992 Dependence on renal dialysis: Secondary | ICD-10-CM | POA: Diagnosis not present

## 2021-03-29 DIAGNOSIS — E785 Hyperlipidemia, unspecified: Secondary | ICD-10-CM | POA: Diagnosis not present

## 2021-03-29 DIAGNOSIS — N186 End stage renal disease: Secondary | ICD-10-CM | POA: Diagnosis not present

## 2021-03-30 DIAGNOSIS — N2581 Secondary hyperparathyroidism of renal origin: Secondary | ICD-10-CM | POA: Diagnosis not present

## 2021-03-30 DIAGNOSIS — E785 Hyperlipidemia, unspecified: Secondary | ICD-10-CM | POA: Diagnosis not present

## 2021-03-30 DIAGNOSIS — Z992 Dependence on renal dialysis: Secondary | ICD-10-CM | POA: Diagnosis not present

## 2021-03-30 DIAGNOSIS — N186 End stage renal disease: Secondary | ICD-10-CM | POA: Diagnosis not present

## 2021-03-30 DIAGNOSIS — D631 Anemia in chronic kidney disease: Secondary | ICD-10-CM | POA: Diagnosis not present

## 2021-03-31 DIAGNOSIS — E785 Hyperlipidemia, unspecified: Secondary | ICD-10-CM | POA: Diagnosis not present

## 2021-03-31 DIAGNOSIS — D631 Anemia in chronic kidney disease: Secondary | ICD-10-CM | POA: Diagnosis not present

## 2021-03-31 DIAGNOSIS — Z992 Dependence on renal dialysis: Secondary | ICD-10-CM | POA: Diagnosis not present

## 2021-03-31 DIAGNOSIS — N186 End stage renal disease: Secondary | ICD-10-CM | POA: Diagnosis not present

## 2021-03-31 DIAGNOSIS — N2581 Secondary hyperparathyroidism of renal origin: Secondary | ICD-10-CM | POA: Diagnosis not present

## 2021-04-01 DIAGNOSIS — Z992 Dependence on renal dialysis: Secondary | ICD-10-CM | POA: Diagnosis not present

## 2021-04-01 DIAGNOSIS — N2581 Secondary hyperparathyroidism of renal origin: Secondary | ICD-10-CM | POA: Diagnosis not present

## 2021-04-01 DIAGNOSIS — D631 Anemia in chronic kidney disease: Secondary | ICD-10-CM | POA: Diagnosis not present

## 2021-04-01 DIAGNOSIS — E785 Hyperlipidemia, unspecified: Secondary | ICD-10-CM | POA: Diagnosis not present

## 2021-04-01 DIAGNOSIS — N186 End stage renal disease: Secondary | ICD-10-CM | POA: Diagnosis not present

## 2021-04-02 DIAGNOSIS — D631 Anemia in chronic kidney disease: Secondary | ICD-10-CM | POA: Diagnosis not present

## 2021-04-02 DIAGNOSIS — E785 Hyperlipidemia, unspecified: Secondary | ICD-10-CM | POA: Diagnosis not present

## 2021-04-02 DIAGNOSIS — N2581 Secondary hyperparathyroidism of renal origin: Secondary | ICD-10-CM | POA: Diagnosis not present

## 2021-04-02 DIAGNOSIS — N186 End stage renal disease: Secondary | ICD-10-CM | POA: Diagnosis not present

## 2021-04-02 DIAGNOSIS — Z992 Dependence on renal dialysis: Secondary | ICD-10-CM | POA: Diagnosis not present

## 2021-04-03 DIAGNOSIS — Z992 Dependence on renal dialysis: Secondary | ICD-10-CM | POA: Diagnosis not present

## 2021-04-03 DIAGNOSIS — D631 Anemia in chronic kidney disease: Secondary | ICD-10-CM | POA: Diagnosis not present

## 2021-04-03 DIAGNOSIS — E785 Hyperlipidemia, unspecified: Secondary | ICD-10-CM | POA: Diagnosis not present

## 2021-04-03 DIAGNOSIS — N186 End stage renal disease: Secondary | ICD-10-CM | POA: Diagnosis not present

## 2021-04-03 DIAGNOSIS — N2581 Secondary hyperparathyroidism of renal origin: Secondary | ICD-10-CM | POA: Diagnosis not present

## 2021-04-04 DIAGNOSIS — E785 Hyperlipidemia, unspecified: Secondary | ICD-10-CM | POA: Diagnosis not present

## 2021-04-04 DIAGNOSIS — N2581 Secondary hyperparathyroidism of renal origin: Secondary | ICD-10-CM | POA: Diagnosis not present

## 2021-04-04 DIAGNOSIS — D631 Anemia in chronic kidney disease: Secondary | ICD-10-CM | POA: Diagnosis not present

## 2021-04-04 DIAGNOSIS — N186 End stage renal disease: Secondary | ICD-10-CM | POA: Diagnosis not present

## 2021-04-04 DIAGNOSIS — Z992 Dependence on renal dialysis: Secondary | ICD-10-CM | POA: Diagnosis not present

## 2021-04-05 DIAGNOSIS — D631 Anemia in chronic kidney disease: Secondary | ICD-10-CM | POA: Diagnosis not present

## 2021-04-05 DIAGNOSIS — N2581 Secondary hyperparathyroidism of renal origin: Secondary | ICD-10-CM | POA: Diagnosis not present

## 2021-04-05 DIAGNOSIS — Z992 Dependence on renal dialysis: Secondary | ICD-10-CM | POA: Diagnosis not present

## 2021-04-05 DIAGNOSIS — N186 End stage renal disease: Secondary | ICD-10-CM | POA: Diagnosis not present

## 2021-04-05 DIAGNOSIS — E785 Hyperlipidemia, unspecified: Secondary | ICD-10-CM | POA: Diagnosis not present

## 2021-04-06 DIAGNOSIS — N2581 Secondary hyperparathyroidism of renal origin: Secondary | ICD-10-CM | POA: Diagnosis not present

## 2021-04-06 DIAGNOSIS — E785 Hyperlipidemia, unspecified: Secondary | ICD-10-CM | POA: Diagnosis not present

## 2021-04-06 DIAGNOSIS — N186 End stage renal disease: Secondary | ICD-10-CM | POA: Diagnosis not present

## 2021-04-06 DIAGNOSIS — Z992 Dependence on renal dialysis: Secondary | ICD-10-CM | POA: Diagnosis not present

## 2021-04-06 DIAGNOSIS — D631 Anemia in chronic kidney disease: Secondary | ICD-10-CM | POA: Diagnosis not present

## 2021-04-07 DIAGNOSIS — Z992 Dependence on renal dialysis: Secondary | ICD-10-CM | POA: Diagnosis not present

## 2021-04-07 DIAGNOSIS — D631 Anemia in chronic kidney disease: Secondary | ICD-10-CM | POA: Diagnosis not present

## 2021-04-07 DIAGNOSIS — N186 End stage renal disease: Secondary | ICD-10-CM | POA: Diagnosis not present

## 2021-04-07 DIAGNOSIS — E785 Hyperlipidemia, unspecified: Secondary | ICD-10-CM | POA: Diagnosis not present

## 2021-04-07 DIAGNOSIS — N2581 Secondary hyperparathyroidism of renal origin: Secondary | ICD-10-CM | POA: Diagnosis not present

## 2021-04-08 DIAGNOSIS — E785 Hyperlipidemia, unspecified: Secondary | ICD-10-CM | POA: Diagnosis not present

## 2021-04-08 DIAGNOSIS — Z992 Dependence on renal dialysis: Secondary | ICD-10-CM | POA: Diagnosis not present

## 2021-04-08 DIAGNOSIS — N2581 Secondary hyperparathyroidism of renal origin: Secondary | ICD-10-CM | POA: Diagnosis not present

## 2021-04-08 DIAGNOSIS — N186 End stage renal disease: Secondary | ICD-10-CM | POA: Diagnosis not present

## 2021-04-08 DIAGNOSIS — D631 Anemia in chronic kidney disease: Secondary | ICD-10-CM | POA: Diagnosis not present

## 2021-04-09 DIAGNOSIS — Z992 Dependence on renal dialysis: Secondary | ICD-10-CM | POA: Diagnosis not present

## 2021-04-09 DIAGNOSIS — E785 Hyperlipidemia, unspecified: Secondary | ICD-10-CM | POA: Diagnosis not present

## 2021-04-09 DIAGNOSIS — N2581 Secondary hyperparathyroidism of renal origin: Secondary | ICD-10-CM | POA: Diagnosis not present

## 2021-04-09 DIAGNOSIS — D631 Anemia in chronic kidney disease: Secondary | ICD-10-CM | POA: Diagnosis not present

## 2021-04-09 DIAGNOSIS — N186 End stage renal disease: Secondary | ICD-10-CM | POA: Diagnosis not present

## 2021-04-10 DIAGNOSIS — Z992 Dependence on renal dialysis: Secondary | ICD-10-CM | POA: Diagnosis not present

## 2021-04-10 DIAGNOSIS — N186 End stage renal disease: Secondary | ICD-10-CM | POA: Diagnosis not present

## 2021-04-10 DIAGNOSIS — N2581 Secondary hyperparathyroidism of renal origin: Secondary | ICD-10-CM | POA: Diagnosis not present

## 2021-04-10 DIAGNOSIS — D631 Anemia in chronic kidney disease: Secondary | ICD-10-CM | POA: Diagnosis not present

## 2021-04-10 DIAGNOSIS — E785 Hyperlipidemia, unspecified: Secondary | ICD-10-CM | POA: Diagnosis not present

## 2021-04-11 DIAGNOSIS — Z992 Dependence on renal dialysis: Secondary | ICD-10-CM | POA: Diagnosis not present

## 2021-04-11 DIAGNOSIS — N186 End stage renal disease: Secondary | ICD-10-CM | POA: Diagnosis not present

## 2021-04-11 DIAGNOSIS — G4733 Obstructive sleep apnea (adult) (pediatric): Secondary | ICD-10-CM | POA: Diagnosis not present

## 2021-04-11 DIAGNOSIS — D631 Anemia in chronic kidney disease: Secondary | ICD-10-CM | POA: Diagnosis not present

## 2021-04-11 DIAGNOSIS — E785 Hyperlipidemia, unspecified: Secondary | ICD-10-CM | POA: Diagnosis not present

## 2021-04-11 DIAGNOSIS — N2581 Secondary hyperparathyroidism of renal origin: Secondary | ICD-10-CM | POA: Diagnosis not present

## 2021-04-12 DIAGNOSIS — N2581 Secondary hyperparathyroidism of renal origin: Secondary | ICD-10-CM | POA: Diagnosis not present

## 2021-04-12 DIAGNOSIS — E785 Hyperlipidemia, unspecified: Secondary | ICD-10-CM | POA: Diagnosis not present

## 2021-04-12 DIAGNOSIS — Z992 Dependence on renal dialysis: Secondary | ICD-10-CM | POA: Diagnosis not present

## 2021-04-12 DIAGNOSIS — D631 Anemia in chronic kidney disease: Secondary | ICD-10-CM | POA: Diagnosis not present

## 2021-04-12 DIAGNOSIS — N186 End stage renal disease: Secondary | ICD-10-CM | POA: Diagnosis not present

## 2021-04-13 DIAGNOSIS — Z992 Dependence on renal dialysis: Secondary | ICD-10-CM | POA: Diagnosis not present

## 2021-04-13 DIAGNOSIS — E785 Hyperlipidemia, unspecified: Secondary | ICD-10-CM | POA: Diagnosis not present

## 2021-04-13 DIAGNOSIS — N2581 Secondary hyperparathyroidism of renal origin: Secondary | ICD-10-CM | POA: Diagnosis not present

## 2021-04-13 DIAGNOSIS — D631 Anemia in chronic kidney disease: Secondary | ICD-10-CM | POA: Diagnosis not present

## 2021-04-13 DIAGNOSIS — H532 Diplopia: Secondary | ICD-10-CM | POA: Diagnosis not present

## 2021-04-13 DIAGNOSIS — N186 End stage renal disease: Secondary | ICD-10-CM | POA: Diagnosis not present

## 2021-04-14 DIAGNOSIS — N2581 Secondary hyperparathyroidism of renal origin: Secondary | ICD-10-CM | POA: Diagnosis not present

## 2021-04-14 DIAGNOSIS — N186 End stage renal disease: Secondary | ICD-10-CM | POA: Diagnosis not present

## 2021-04-14 DIAGNOSIS — Z992 Dependence on renal dialysis: Secondary | ICD-10-CM | POA: Diagnosis not present

## 2021-04-14 DIAGNOSIS — E1122 Type 2 diabetes mellitus with diabetic chronic kidney disease: Secondary | ICD-10-CM | POA: Diagnosis not present

## 2021-04-14 DIAGNOSIS — D631 Anemia in chronic kidney disease: Secondary | ICD-10-CM | POA: Diagnosis not present

## 2021-04-14 DIAGNOSIS — E785 Hyperlipidemia, unspecified: Secondary | ICD-10-CM | POA: Diagnosis not present

## 2021-04-15 DIAGNOSIS — Z992 Dependence on renal dialysis: Secondary | ICD-10-CM | POA: Diagnosis not present

## 2021-04-15 DIAGNOSIS — N2581 Secondary hyperparathyroidism of renal origin: Secondary | ICD-10-CM | POA: Diagnosis not present

## 2021-04-15 DIAGNOSIS — D631 Anemia in chronic kidney disease: Secondary | ICD-10-CM | POA: Diagnosis not present

## 2021-04-15 DIAGNOSIS — E785 Hyperlipidemia, unspecified: Secondary | ICD-10-CM | POA: Diagnosis not present

## 2021-04-15 DIAGNOSIS — N186 End stage renal disease: Secondary | ICD-10-CM | POA: Diagnosis not present

## 2021-04-16 DIAGNOSIS — Z992 Dependence on renal dialysis: Secondary | ICD-10-CM | POA: Diagnosis not present

## 2021-04-16 DIAGNOSIS — N186 End stage renal disease: Secondary | ICD-10-CM | POA: Diagnosis not present

## 2021-04-16 DIAGNOSIS — E785 Hyperlipidemia, unspecified: Secondary | ICD-10-CM | POA: Diagnosis not present

## 2021-04-16 DIAGNOSIS — N2581 Secondary hyperparathyroidism of renal origin: Secondary | ICD-10-CM | POA: Diagnosis not present

## 2021-04-16 DIAGNOSIS — D631 Anemia in chronic kidney disease: Secondary | ICD-10-CM | POA: Diagnosis not present

## 2021-04-17 DIAGNOSIS — D631 Anemia in chronic kidney disease: Secondary | ICD-10-CM | POA: Diagnosis not present

## 2021-04-17 DIAGNOSIS — Z992 Dependence on renal dialysis: Secondary | ICD-10-CM | POA: Diagnosis not present

## 2021-04-17 DIAGNOSIS — N2581 Secondary hyperparathyroidism of renal origin: Secondary | ICD-10-CM | POA: Diagnosis not present

## 2021-04-17 DIAGNOSIS — N186 End stage renal disease: Secondary | ICD-10-CM | POA: Diagnosis not present

## 2021-04-17 DIAGNOSIS — E785 Hyperlipidemia, unspecified: Secondary | ICD-10-CM | POA: Diagnosis not present

## 2021-04-18 DIAGNOSIS — N2581 Secondary hyperparathyroidism of renal origin: Secondary | ICD-10-CM | POA: Diagnosis not present

## 2021-04-18 DIAGNOSIS — N186 End stage renal disease: Secondary | ICD-10-CM | POA: Diagnosis not present

## 2021-04-18 DIAGNOSIS — Z992 Dependence on renal dialysis: Secondary | ICD-10-CM | POA: Diagnosis not present

## 2021-04-18 DIAGNOSIS — D631 Anemia in chronic kidney disease: Secondary | ICD-10-CM | POA: Diagnosis not present

## 2021-04-18 DIAGNOSIS — E785 Hyperlipidemia, unspecified: Secondary | ICD-10-CM | POA: Diagnosis not present

## 2021-04-19 DIAGNOSIS — D631 Anemia in chronic kidney disease: Secondary | ICD-10-CM | POA: Diagnosis not present

## 2021-04-19 DIAGNOSIS — E785 Hyperlipidemia, unspecified: Secondary | ICD-10-CM | POA: Diagnosis not present

## 2021-04-19 DIAGNOSIS — Z992 Dependence on renal dialysis: Secondary | ICD-10-CM | POA: Diagnosis not present

## 2021-04-19 DIAGNOSIS — N186 End stage renal disease: Secondary | ICD-10-CM | POA: Diagnosis not present

## 2021-04-19 DIAGNOSIS — N2581 Secondary hyperparathyroidism of renal origin: Secondary | ICD-10-CM | POA: Diagnosis not present

## 2021-04-20 DIAGNOSIS — E785 Hyperlipidemia, unspecified: Secondary | ICD-10-CM | POA: Diagnosis not present

## 2021-04-20 DIAGNOSIS — Z992 Dependence on renal dialysis: Secondary | ICD-10-CM | POA: Diagnosis not present

## 2021-04-20 DIAGNOSIS — N186 End stage renal disease: Secondary | ICD-10-CM | POA: Diagnosis not present

## 2021-04-20 DIAGNOSIS — D631 Anemia in chronic kidney disease: Secondary | ICD-10-CM | POA: Diagnosis not present

## 2021-04-20 DIAGNOSIS — N2581 Secondary hyperparathyroidism of renal origin: Secondary | ICD-10-CM | POA: Diagnosis not present

## 2021-04-21 DIAGNOSIS — Z992 Dependence on renal dialysis: Secondary | ICD-10-CM | POA: Diagnosis not present

## 2021-04-21 DIAGNOSIS — N186 End stage renal disease: Secondary | ICD-10-CM | POA: Diagnosis not present

## 2021-04-21 DIAGNOSIS — E785 Hyperlipidemia, unspecified: Secondary | ICD-10-CM | POA: Diagnosis not present

## 2021-04-21 DIAGNOSIS — D631 Anemia in chronic kidney disease: Secondary | ICD-10-CM | POA: Diagnosis not present

## 2021-04-21 DIAGNOSIS — N2581 Secondary hyperparathyroidism of renal origin: Secondary | ICD-10-CM | POA: Diagnosis not present

## 2021-04-22 DIAGNOSIS — Z992 Dependence on renal dialysis: Secondary | ICD-10-CM | POA: Diagnosis not present

## 2021-04-22 DIAGNOSIS — N2581 Secondary hyperparathyroidism of renal origin: Secondary | ICD-10-CM | POA: Diagnosis not present

## 2021-04-22 DIAGNOSIS — E785 Hyperlipidemia, unspecified: Secondary | ICD-10-CM | POA: Diagnosis not present

## 2021-04-22 DIAGNOSIS — D631 Anemia in chronic kidney disease: Secondary | ICD-10-CM | POA: Diagnosis not present

## 2021-04-22 DIAGNOSIS — N186 End stage renal disease: Secondary | ICD-10-CM | POA: Diagnosis not present

## 2021-04-23 DIAGNOSIS — D631 Anemia in chronic kidney disease: Secondary | ICD-10-CM | POA: Diagnosis not present

## 2021-04-23 DIAGNOSIS — N2581 Secondary hyperparathyroidism of renal origin: Secondary | ICD-10-CM | POA: Diagnosis not present

## 2021-04-23 DIAGNOSIS — N186 End stage renal disease: Secondary | ICD-10-CM | POA: Diagnosis not present

## 2021-04-23 DIAGNOSIS — E785 Hyperlipidemia, unspecified: Secondary | ICD-10-CM | POA: Diagnosis not present

## 2021-04-23 DIAGNOSIS — Z992 Dependence on renal dialysis: Secondary | ICD-10-CM | POA: Diagnosis not present

## 2021-04-24 DIAGNOSIS — N2581 Secondary hyperparathyroidism of renal origin: Secondary | ICD-10-CM | POA: Diagnosis not present

## 2021-04-24 DIAGNOSIS — N186 End stage renal disease: Secondary | ICD-10-CM | POA: Diagnosis not present

## 2021-04-24 DIAGNOSIS — E785 Hyperlipidemia, unspecified: Secondary | ICD-10-CM | POA: Diagnosis not present

## 2021-04-24 DIAGNOSIS — D631 Anemia in chronic kidney disease: Secondary | ICD-10-CM | POA: Diagnosis not present

## 2021-04-24 DIAGNOSIS — Z992 Dependence on renal dialysis: Secondary | ICD-10-CM | POA: Diagnosis not present

## 2021-04-25 DIAGNOSIS — N2581 Secondary hyperparathyroidism of renal origin: Secondary | ICD-10-CM | POA: Diagnosis not present

## 2021-04-25 DIAGNOSIS — E785 Hyperlipidemia, unspecified: Secondary | ICD-10-CM | POA: Diagnosis not present

## 2021-04-25 DIAGNOSIS — D631 Anemia in chronic kidney disease: Secondary | ICD-10-CM | POA: Diagnosis not present

## 2021-04-25 DIAGNOSIS — Z992 Dependence on renal dialysis: Secondary | ICD-10-CM | POA: Diagnosis not present

## 2021-04-25 DIAGNOSIS — N186 End stage renal disease: Secondary | ICD-10-CM | POA: Diagnosis not present

## 2021-04-26 DIAGNOSIS — N186 End stage renal disease: Secondary | ICD-10-CM | POA: Diagnosis not present

## 2021-04-26 DIAGNOSIS — Z992 Dependence on renal dialysis: Secondary | ICD-10-CM | POA: Diagnosis not present

## 2021-04-26 DIAGNOSIS — D631 Anemia in chronic kidney disease: Secondary | ICD-10-CM | POA: Diagnosis not present

## 2021-04-26 DIAGNOSIS — E785 Hyperlipidemia, unspecified: Secondary | ICD-10-CM | POA: Diagnosis not present

## 2021-04-26 DIAGNOSIS — N2581 Secondary hyperparathyroidism of renal origin: Secondary | ICD-10-CM | POA: Diagnosis not present

## 2021-04-27 DIAGNOSIS — Z992 Dependence on renal dialysis: Secondary | ICD-10-CM | POA: Diagnosis not present

## 2021-04-27 DIAGNOSIS — N2581 Secondary hyperparathyroidism of renal origin: Secondary | ICD-10-CM | POA: Diagnosis not present

## 2021-04-27 DIAGNOSIS — N186 End stage renal disease: Secondary | ICD-10-CM | POA: Diagnosis not present

## 2021-04-27 DIAGNOSIS — E785 Hyperlipidemia, unspecified: Secondary | ICD-10-CM | POA: Diagnosis not present

## 2021-04-27 DIAGNOSIS — D631 Anemia in chronic kidney disease: Secondary | ICD-10-CM | POA: Diagnosis not present

## 2021-04-28 DIAGNOSIS — N2581 Secondary hyperparathyroidism of renal origin: Secondary | ICD-10-CM | POA: Diagnosis not present

## 2021-04-28 DIAGNOSIS — D631 Anemia in chronic kidney disease: Secondary | ICD-10-CM | POA: Diagnosis not present

## 2021-04-28 DIAGNOSIS — E785 Hyperlipidemia, unspecified: Secondary | ICD-10-CM | POA: Diagnosis not present

## 2021-04-28 DIAGNOSIS — N186 End stage renal disease: Secondary | ICD-10-CM | POA: Diagnosis not present

## 2021-04-28 DIAGNOSIS — Z992 Dependence on renal dialysis: Secondary | ICD-10-CM | POA: Diagnosis not present

## 2021-04-29 DIAGNOSIS — N186 End stage renal disease: Secondary | ICD-10-CM | POA: Diagnosis not present

## 2021-04-29 DIAGNOSIS — D631 Anemia in chronic kidney disease: Secondary | ICD-10-CM | POA: Diagnosis not present

## 2021-04-29 DIAGNOSIS — Z992 Dependence on renal dialysis: Secondary | ICD-10-CM | POA: Diagnosis not present

## 2021-04-29 DIAGNOSIS — N2581 Secondary hyperparathyroidism of renal origin: Secondary | ICD-10-CM | POA: Diagnosis not present

## 2021-04-29 DIAGNOSIS — E785 Hyperlipidemia, unspecified: Secondary | ICD-10-CM | POA: Diagnosis not present

## 2021-04-30 DIAGNOSIS — Z992 Dependence on renal dialysis: Secondary | ICD-10-CM | POA: Diagnosis not present

## 2021-04-30 DIAGNOSIS — E785 Hyperlipidemia, unspecified: Secondary | ICD-10-CM | POA: Diagnosis not present

## 2021-04-30 DIAGNOSIS — D631 Anemia in chronic kidney disease: Secondary | ICD-10-CM | POA: Diagnosis not present

## 2021-04-30 DIAGNOSIS — N2581 Secondary hyperparathyroidism of renal origin: Secondary | ICD-10-CM | POA: Diagnosis not present

## 2021-04-30 DIAGNOSIS — N186 End stage renal disease: Secondary | ICD-10-CM | POA: Diagnosis not present

## 2021-05-01 DIAGNOSIS — D631 Anemia in chronic kidney disease: Secondary | ICD-10-CM | POA: Diagnosis not present

## 2021-05-01 DIAGNOSIS — Z992 Dependence on renal dialysis: Secondary | ICD-10-CM | POA: Diagnosis not present

## 2021-05-01 DIAGNOSIS — E785 Hyperlipidemia, unspecified: Secondary | ICD-10-CM | POA: Diagnosis not present

## 2021-05-01 DIAGNOSIS — N186 End stage renal disease: Secondary | ICD-10-CM | POA: Diagnosis not present

## 2021-05-01 DIAGNOSIS — N2581 Secondary hyperparathyroidism of renal origin: Secondary | ICD-10-CM | POA: Diagnosis not present

## 2021-05-02 DIAGNOSIS — N2581 Secondary hyperparathyroidism of renal origin: Secondary | ICD-10-CM | POA: Diagnosis not present

## 2021-05-02 DIAGNOSIS — D631 Anemia in chronic kidney disease: Secondary | ICD-10-CM | POA: Diagnosis not present

## 2021-05-02 DIAGNOSIS — Z992 Dependence on renal dialysis: Secondary | ICD-10-CM | POA: Diagnosis not present

## 2021-05-02 DIAGNOSIS — N186 End stage renal disease: Secondary | ICD-10-CM | POA: Diagnosis not present

## 2021-05-02 DIAGNOSIS — E785 Hyperlipidemia, unspecified: Secondary | ICD-10-CM | POA: Diagnosis not present

## 2021-05-03 DIAGNOSIS — D631 Anemia in chronic kidney disease: Secondary | ICD-10-CM | POA: Diagnosis not present

## 2021-05-03 DIAGNOSIS — Z992 Dependence on renal dialysis: Secondary | ICD-10-CM | POA: Diagnosis not present

## 2021-05-03 DIAGNOSIS — N2581 Secondary hyperparathyroidism of renal origin: Secondary | ICD-10-CM | POA: Diagnosis not present

## 2021-05-03 DIAGNOSIS — E785 Hyperlipidemia, unspecified: Secondary | ICD-10-CM | POA: Diagnosis not present

## 2021-05-03 DIAGNOSIS — N186 End stage renal disease: Secondary | ICD-10-CM | POA: Diagnosis not present

## 2021-05-04 DIAGNOSIS — D631 Anemia in chronic kidney disease: Secondary | ICD-10-CM | POA: Diagnosis not present

## 2021-05-04 DIAGNOSIS — E785 Hyperlipidemia, unspecified: Secondary | ICD-10-CM | POA: Diagnosis not present

## 2021-05-04 DIAGNOSIS — N2581 Secondary hyperparathyroidism of renal origin: Secondary | ICD-10-CM | POA: Diagnosis not present

## 2021-05-04 DIAGNOSIS — N186 End stage renal disease: Secondary | ICD-10-CM | POA: Diagnosis not present

## 2021-05-04 DIAGNOSIS — Z992 Dependence on renal dialysis: Secondary | ICD-10-CM | POA: Diagnosis not present

## 2021-05-05 DIAGNOSIS — D631 Anemia in chronic kidney disease: Secondary | ICD-10-CM | POA: Diagnosis not present

## 2021-05-05 DIAGNOSIS — R1013 Epigastric pain: Secondary | ICD-10-CM | POA: Diagnosis not present

## 2021-05-05 DIAGNOSIS — Z992 Dependence on renal dialysis: Secondary | ICD-10-CM | POA: Diagnosis not present

## 2021-05-05 DIAGNOSIS — N186 End stage renal disease: Secondary | ICD-10-CM | POA: Diagnosis not present

## 2021-05-05 DIAGNOSIS — E785 Hyperlipidemia, unspecified: Secondary | ICD-10-CM | POA: Diagnosis not present

## 2021-05-05 DIAGNOSIS — N2581 Secondary hyperparathyroidism of renal origin: Secondary | ICD-10-CM | POA: Diagnosis not present

## 2021-05-06 DIAGNOSIS — N186 End stage renal disease: Secondary | ICD-10-CM | POA: Diagnosis not present

## 2021-05-06 DIAGNOSIS — Z992 Dependence on renal dialysis: Secondary | ICD-10-CM | POA: Diagnosis not present

## 2021-05-06 DIAGNOSIS — N2581 Secondary hyperparathyroidism of renal origin: Secondary | ICD-10-CM | POA: Diagnosis not present

## 2021-05-06 DIAGNOSIS — E785 Hyperlipidemia, unspecified: Secondary | ICD-10-CM | POA: Diagnosis not present

## 2021-05-06 DIAGNOSIS — D631 Anemia in chronic kidney disease: Secondary | ICD-10-CM | POA: Diagnosis not present

## 2021-05-07 DIAGNOSIS — E785 Hyperlipidemia, unspecified: Secondary | ICD-10-CM | POA: Diagnosis not present

## 2021-05-07 DIAGNOSIS — N186 End stage renal disease: Secondary | ICD-10-CM | POA: Diagnosis not present

## 2021-05-07 DIAGNOSIS — D631 Anemia in chronic kidney disease: Secondary | ICD-10-CM | POA: Diagnosis not present

## 2021-05-07 DIAGNOSIS — Z992 Dependence on renal dialysis: Secondary | ICD-10-CM | POA: Diagnosis not present

## 2021-05-07 DIAGNOSIS — N2581 Secondary hyperparathyroidism of renal origin: Secondary | ICD-10-CM | POA: Diagnosis not present

## 2021-05-08 DIAGNOSIS — N186 End stage renal disease: Secondary | ICD-10-CM | POA: Diagnosis not present

## 2021-05-08 DIAGNOSIS — N2581 Secondary hyperparathyroidism of renal origin: Secondary | ICD-10-CM | POA: Diagnosis not present

## 2021-05-08 DIAGNOSIS — Z992 Dependence on renal dialysis: Secondary | ICD-10-CM | POA: Diagnosis not present

## 2021-05-08 DIAGNOSIS — D631 Anemia in chronic kidney disease: Secondary | ICD-10-CM | POA: Diagnosis not present

## 2021-05-08 DIAGNOSIS — E785 Hyperlipidemia, unspecified: Secondary | ICD-10-CM | POA: Diagnosis not present

## 2021-05-09 DIAGNOSIS — Z992 Dependence on renal dialysis: Secondary | ICD-10-CM | POA: Diagnosis not present

## 2021-05-09 DIAGNOSIS — D631 Anemia in chronic kidney disease: Secondary | ICD-10-CM | POA: Diagnosis not present

## 2021-05-09 DIAGNOSIS — N2581 Secondary hyperparathyroidism of renal origin: Secondary | ICD-10-CM | POA: Diagnosis not present

## 2021-05-09 DIAGNOSIS — E785 Hyperlipidemia, unspecified: Secondary | ICD-10-CM | POA: Diagnosis not present

## 2021-05-09 DIAGNOSIS — N186 End stage renal disease: Secondary | ICD-10-CM | POA: Diagnosis not present

## 2021-05-10 DIAGNOSIS — D631 Anemia in chronic kidney disease: Secondary | ICD-10-CM | POA: Diagnosis not present

## 2021-05-10 DIAGNOSIS — Z992 Dependence on renal dialysis: Secondary | ICD-10-CM | POA: Diagnosis not present

## 2021-05-10 DIAGNOSIS — N2581 Secondary hyperparathyroidism of renal origin: Secondary | ICD-10-CM | POA: Diagnosis not present

## 2021-05-10 DIAGNOSIS — N186 End stage renal disease: Secondary | ICD-10-CM | POA: Diagnosis not present

## 2021-05-10 DIAGNOSIS — E785 Hyperlipidemia, unspecified: Secondary | ICD-10-CM | POA: Diagnosis not present

## 2021-05-11 DIAGNOSIS — N186 End stage renal disease: Secondary | ICD-10-CM | POA: Diagnosis not present

## 2021-05-11 DIAGNOSIS — Z992 Dependence on renal dialysis: Secondary | ICD-10-CM | POA: Diagnosis not present

## 2021-05-11 DIAGNOSIS — N2581 Secondary hyperparathyroidism of renal origin: Secondary | ICD-10-CM | POA: Diagnosis not present

## 2021-05-11 DIAGNOSIS — G4733 Obstructive sleep apnea (adult) (pediatric): Secondary | ICD-10-CM | POA: Diagnosis not present

## 2021-05-11 DIAGNOSIS — E785 Hyperlipidemia, unspecified: Secondary | ICD-10-CM | POA: Diagnosis not present

## 2021-05-11 DIAGNOSIS — D631 Anemia in chronic kidney disease: Secondary | ICD-10-CM | POA: Diagnosis not present

## 2021-05-12 DIAGNOSIS — N186 End stage renal disease: Secondary | ICD-10-CM | POA: Diagnosis not present

## 2021-05-12 DIAGNOSIS — D631 Anemia in chronic kidney disease: Secondary | ICD-10-CM | POA: Diagnosis not present

## 2021-05-12 DIAGNOSIS — Z992 Dependence on renal dialysis: Secondary | ICD-10-CM | POA: Diagnosis not present

## 2021-05-12 DIAGNOSIS — N2581 Secondary hyperparathyroidism of renal origin: Secondary | ICD-10-CM | POA: Diagnosis not present

## 2021-05-12 DIAGNOSIS — E785 Hyperlipidemia, unspecified: Secondary | ICD-10-CM | POA: Diagnosis not present

## 2021-05-13 DIAGNOSIS — Z992 Dependence on renal dialysis: Secondary | ICD-10-CM | POA: Diagnosis not present

## 2021-05-13 DIAGNOSIS — E785 Hyperlipidemia, unspecified: Secondary | ICD-10-CM | POA: Diagnosis not present

## 2021-05-13 DIAGNOSIS — N186 End stage renal disease: Secondary | ICD-10-CM | POA: Diagnosis not present

## 2021-05-13 DIAGNOSIS — D631 Anemia in chronic kidney disease: Secondary | ICD-10-CM | POA: Diagnosis not present

## 2021-05-13 DIAGNOSIS — N2581 Secondary hyperparathyroidism of renal origin: Secondary | ICD-10-CM | POA: Diagnosis not present

## 2021-05-14 DIAGNOSIS — D631 Anemia in chronic kidney disease: Secondary | ICD-10-CM | POA: Diagnosis not present

## 2021-05-14 DIAGNOSIS — N2581 Secondary hyperparathyroidism of renal origin: Secondary | ICD-10-CM | POA: Diagnosis not present

## 2021-05-14 DIAGNOSIS — N186 End stage renal disease: Secondary | ICD-10-CM | POA: Diagnosis not present

## 2021-05-14 DIAGNOSIS — E785 Hyperlipidemia, unspecified: Secondary | ICD-10-CM | POA: Diagnosis not present

## 2021-05-14 DIAGNOSIS — Z992 Dependence on renal dialysis: Secondary | ICD-10-CM | POA: Diagnosis not present

## 2021-05-15 DIAGNOSIS — E1122 Type 2 diabetes mellitus with diabetic chronic kidney disease: Secondary | ICD-10-CM | POA: Diagnosis not present

## 2021-05-15 DIAGNOSIS — N2581 Secondary hyperparathyroidism of renal origin: Secondary | ICD-10-CM | POA: Diagnosis not present

## 2021-05-15 DIAGNOSIS — N186 End stage renal disease: Secondary | ICD-10-CM | POA: Diagnosis not present

## 2021-05-15 DIAGNOSIS — Z992 Dependence on renal dialysis: Secondary | ICD-10-CM | POA: Diagnosis not present

## 2021-05-15 DIAGNOSIS — E785 Hyperlipidemia, unspecified: Secondary | ICD-10-CM | POA: Diagnosis not present

## 2021-05-15 DIAGNOSIS — D631 Anemia in chronic kidney disease: Secondary | ICD-10-CM | POA: Diagnosis not present

## 2021-05-16 DIAGNOSIS — N186 End stage renal disease: Secondary | ICD-10-CM | POA: Diagnosis not present

## 2021-05-16 DIAGNOSIS — Z992 Dependence on renal dialysis: Secondary | ICD-10-CM | POA: Diagnosis not present

## 2021-05-16 DIAGNOSIS — D631 Anemia in chronic kidney disease: Secondary | ICD-10-CM | POA: Diagnosis not present

## 2021-05-16 DIAGNOSIS — Z4932 Encounter for adequacy testing for peritoneal dialysis: Secondary | ICD-10-CM | POA: Diagnosis not present

## 2021-05-16 DIAGNOSIS — N2581 Secondary hyperparathyroidism of renal origin: Secondary | ICD-10-CM | POA: Diagnosis not present

## 2021-05-17 DIAGNOSIS — N186 End stage renal disease: Secondary | ICD-10-CM | POA: Diagnosis not present

## 2021-05-17 DIAGNOSIS — Z4932 Encounter for adequacy testing for peritoneal dialysis: Secondary | ICD-10-CM | POA: Diagnosis not present

## 2021-05-17 DIAGNOSIS — N2581 Secondary hyperparathyroidism of renal origin: Secondary | ICD-10-CM | POA: Diagnosis not present

## 2021-05-17 DIAGNOSIS — Z992 Dependence on renal dialysis: Secondary | ICD-10-CM | POA: Diagnosis not present

## 2021-05-17 DIAGNOSIS — D631 Anemia in chronic kidney disease: Secondary | ICD-10-CM | POA: Diagnosis not present

## 2021-05-18 DIAGNOSIS — Z6841 Body Mass Index (BMI) 40.0 and over, adult: Secondary | ICD-10-CM | POA: Diagnosis not present

## 2021-05-18 DIAGNOSIS — N185 Chronic kidney disease, stage 5: Secondary | ICD-10-CM | POA: Diagnosis not present

## 2021-05-18 DIAGNOSIS — N186 End stage renal disease: Secondary | ICD-10-CM | POA: Diagnosis not present

## 2021-05-18 DIAGNOSIS — Z4932 Encounter for adequacy testing for peritoneal dialysis: Secondary | ICD-10-CM | POA: Diagnosis not present

## 2021-05-18 DIAGNOSIS — Z992 Dependence on renal dialysis: Secondary | ICD-10-CM | POA: Diagnosis not present

## 2021-05-18 DIAGNOSIS — I10 Essential (primary) hypertension: Secondary | ICD-10-CM | POA: Diagnosis not present

## 2021-05-18 DIAGNOSIS — R030 Elevated blood-pressure reading, without diagnosis of hypertension: Secondary | ICD-10-CM | POA: Diagnosis not present

## 2021-05-18 DIAGNOSIS — N2581 Secondary hyperparathyroidism of renal origin: Secondary | ICD-10-CM | POA: Diagnosis not present

## 2021-05-18 DIAGNOSIS — D631 Anemia in chronic kidney disease: Secondary | ICD-10-CM | POA: Diagnosis not present

## 2021-05-19 ENCOUNTER — Telehealth: Payer: Self-pay | Admitting: Family Medicine

## 2021-05-19 DIAGNOSIS — D631 Anemia in chronic kidney disease: Secondary | ICD-10-CM | POA: Diagnosis not present

## 2021-05-19 DIAGNOSIS — N186 End stage renal disease: Secondary | ICD-10-CM | POA: Diagnosis not present

## 2021-05-19 DIAGNOSIS — Z4932 Encounter for adequacy testing for peritoneal dialysis: Secondary | ICD-10-CM | POA: Diagnosis not present

## 2021-05-19 DIAGNOSIS — N2581 Secondary hyperparathyroidism of renal origin: Secondary | ICD-10-CM | POA: Diagnosis not present

## 2021-05-19 DIAGNOSIS — Z992 Dependence on renal dialysis: Secondary | ICD-10-CM | POA: Diagnosis not present

## 2021-05-19 NOTE — Telephone Encounter (Signed)
Tried calling patient to schedule Medicare Annual Wellness Visit (AWV) either virtually or in office.  No answer   awvi 07/15/19 per [facility]   please schedule at anytime with health coach  This should be a 45 minute visit.

## 2021-05-20 DIAGNOSIS — Z992 Dependence on renal dialysis: Secondary | ICD-10-CM | POA: Diagnosis not present

## 2021-05-20 DIAGNOSIS — N2581 Secondary hyperparathyroidism of renal origin: Secondary | ICD-10-CM | POA: Diagnosis not present

## 2021-05-20 DIAGNOSIS — Z4932 Encounter for adequacy testing for peritoneal dialysis: Secondary | ICD-10-CM | POA: Diagnosis not present

## 2021-05-20 DIAGNOSIS — N186 End stage renal disease: Secondary | ICD-10-CM | POA: Diagnosis not present

## 2021-05-20 DIAGNOSIS — D631 Anemia in chronic kidney disease: Secondary | ICD-10-CM | POA: Diagnosis not present

## 2021-05-21 DIAGNOSIS — D631 Anemia in chronic kidney disease: Secondary | ICD-10-CM | POA: Diagnosis not present

## 2021-05-21 DIAGNOSIS — Z4932 Encounter for adequacy testing for peritoneal dialysis: Secondary | ICD-10-CM | POA: Diagnosis not present

## 2021-05-21 DIAGNOSIS — N186 End stage renal disease: Secondary | ICD-10-CM | POA: Diagnosis not present

## 2021-05-21 DIAGNOSIS — N2581 Secondary hyperparathyroidism of renal origin: Secondary | ICD-10-CM | POA: Diagnosis not present

## 2021-05-21 DIAGNOSIS — Z992 Dependence on renal dialysis: Secondary | ICD-10-CM | POA: Diagnosis not present

## 2021-05-22 DIAGNOSIS — N186 End stage renal disease: Secondary | ICD-10-CM | POA: Diagnosis not present

## 2021-05-22 DIAGNOSIS — Z992 Dependence on renal dialysis: Secondary | ICD-10-CM | POA: Diagnosis not present

## 2021-05-22 DIAGNOSIS — Z4932 Encounter for adequacy testing for peritoneal dialysis: Secondary | ICD-10-CM | POA: Diagnosis not present

## 2021-05-22 DIAGNOSIS — N2581 Secondary hyperparathyroidism of renal origin: Secondary | ICD-10-CM | POA: Diagnosis not present

## 2021-05-22 DIAGNOSIS — D631 Anemia in chronic kidney disease: Secondary | ICD-10-CM | POA: Diagnosis not present

## 2021-05-23 DIAGNOSIS — Z992 Dependence on renal dialysis: Secondary | ICD-10-CM | POA: Diagnosis not present

## 2021-05-23 DIAGNOSIS — N2581 Secondary hyperparathyroidism of renal origin: Secondary | ICD-10-CM | POA: Diagnosis not present

## 2021-05-23 DIAGNOSIS — D631 Anemia in chronic kidney disease: Secondary | ICD-10-CM | POA: Diagnosis not present

## 2021-05-23 DIAGNOSIS — N186 End stage renal disease: Secondary | ICD-10-CM | POA: Diagnosis not present

## 2021-05-23 DIAGNOSIS — Z4932 Encounter for adequacy testing for peritoneal dialysis: Secondary | ICD-10-CM | POA: Diagnosis not present

## 2021-05-24 DIAGNOSIS — D631 Anemia in chronic kidney disease: Secondary | ICD-10-CM | POA: Diagnosis not present

## 2021-05-24 DIAGNOSIS — Z992 Dependence on renal dialysis: Secondary | ICD-10-CM | POA: Diagnosis not present

## 2021-05-24 DIAGNOSIS — N2581 Secondary hyperparathyroidism of renal origin: Secondary | ICD-10-CM | POA: Diagnosis not present

## 2021-05-24 DIAGNOSIS — Z4932 Encounter for adequacy testing for peritoneal dialysis: Secondary | ICD-10-CM | POA: Diagnosis not present

## 2021-05-24 DIAGNOSIS — N186 End stage renal disease: Secondary | ICD-10-CM | POA: Diagnosis not present

## 2021-05-25 DIAGNOSIS — D631 Anemia in chronic kidney disease: Secondary | ICD-10-CM | POA: Diagnosis not present

## 2021-05-25 DIAGNOSIS — Z4932 Encounter for adequacy testing for peritoneal dialysis: Secondary | ICD-10-CM | POA: Diagnosis not present

## 2021-05-25 DIAGNOSIS — N2581 Secondary hyperparathyroidism of renal origin: Secondary | ICD-10-CM | POA: Diagnosis not present

## 2021-05-25 DIAGNOSIS — N186 End stage renal disease: Secondary | ICD-10-CM | POA: Diagnosis not present

## 2021-05-25 DIAGNOSIS — Z992 Dependence on renal dialysis: Secondary | ICD-10-CM | POA: Diagnosis not present

## 2021-05-26 DIAGNOSIS — Z992 Dependence on renal dialysis: Secondary | ICD-10-CM | POA: Diagnosis not present

## 2021-05-26 DIAGNOSIS — N186 End stage renal disease: Secondary | ICD-10-CM | POA: Diagnosis not present

## 2021-05-26 DIAGNOSIS — Z4932 Encounter for adequacy testing for peritoneal dialysis: Secondary | ICD-10-CM | POA: Diagnosis not present

## 2021-05-26 DIAGNOSIS — D631 Anemia in chronic kidney disease: Secondary | ICD-10-CM | POA: Diagnosis not present

## 2021-05-26 DIAGNOSIS — N2581 Secondary hyperparathyroidism of renal origin: Secondary | ICD-10-CM | POA: Diagnosis not present

## 2021-05-27 DIAGNOSIS — Z4932 Encounter for adequacy testing for peritoneal dialysis: Secondary | ICD-10-CM | POA: Diagnosis not present

## 2021-05-27 DIAGNOSIS — N2581 Secondary hyperparathyroidism of renal origin: Secondary | ICD-10-CM | POA: Diagnosis not present

## 2021-05-27 DIAGNOSIS — N185 Chronic kidney disease, stage 5: Secondary | ICD-10-CM | POA: Diagnosis not present

## 2021-05-27 DIAGNOSIS — Z992 Dependence on renal dialysis: Secondary | ICD-10-CM | POA: Diagnosis not present

## 2021-05-27 DIAGNOSIS — R112 Nausea with vomiting, unspecified: Secondary | ICD-10-CM | POA: Diagnosis not present

## 2021-05-27 DIAGNOSIS — N186 End stage renal disease: Secondary | ICD-10-CM | POA: Diagnosis not present

## 2021-05-27 DIAGNOSIS — D631 Anemia in chronic kidney disease: Secondary | ICD-10-CM | POA: Diagnosis not present

## 2021-05-28 DIAGNOSIS — D631 Anemia in chronic kidney disease: Secondary | ICD-10-CM | POA: Diagnosis not present

## 2021-05-28 DIAGNOSIS — N2581 Secondary hyperparathyroidism of renal origin: Secondary | ICD-10-CM | POA: Diagnosis not present

## 2021-05-28 DIAGNOSIS — N186 End stage renal disease: Secondary | ICD-10-CM | POA: Diagnosis not present

## 2021-05-28 DIAGNOSIS — Z4932 Encounter for adequacy testing for peritoneal dialysis: Secondary | ICD-10-CM | POA: Diagnosis not present

## 2021-05-28 DIAGNOSIS — Z992 Dependence on renal dialysis: Secondary | ICD-10-CM | POA: Diagnosis not present

## 2021-05-29 DIAGNOSIS — Z992 Dependence on renal dialysis: Secondary | ICD-10-CM | POA: Diagnosis not present

## 2021-05-29 DIAGNOSIS — D631 Anemia in chronic kidney disease: Secondary | ICD-10-CM | POA: Diagnosis not present

## 2021-05-29 DIAGNOSIS — N186 End stage renal disease: Secondary | ICD-10-CM | POA: Diagnosis not present

## 2021-05-29 DIAGNOSIS — N2581 Secondary hyperparathyroidism of renal origin: Secondary | ICD-10-CM | POA: Diagnosis not present

## 2021-05-29 DIAGNOSIS — Z4932 Encounter for adequacy testing for peritoneal dialysis: Secondary | ICD-10-CM | POA: Diagnosis not present

## 2021-05-30 DIAGNOSIS — Z4932 Encounter for adequacy testing for peritoneal dialysis: Secondary | ICD-10-CM | POA: Diagnosis not present

## 2021-05-30 DIAGNOSIS — N186 End stage renal disease: Secondary | ICD-10-CM | POA: Diagnosis not present

## 2021-05-30 DIAGNOSIS — D631 Anemia in chronic kidney disease: Secondary | ICD-10-CM | POA: Diagnosis not present

## 2021-05-30 DIAGNOSIS — N2581 Secondary hyperparathyroidism of renal origin: Secondary | ICD-10-CM | POA: Diagnosis not present

## 2021-05-30 DIAGNOSIS — Z992 Dependence on renal dialysis: Secondary | ICD-10-CM | POA: Diagnosis not present

## 2021-05-31 DIAGNOSIS — Z4932 Encounter for adequacy testing for peritoneal dialysis: Secondary | ICD-10-CM | POA: Diagnosis not present

## 2021-05-31 DIAGNOSIS — N186 End stage renal disease: Secondary | ICD-10-CM | POA: Diagnosis not present

## 2021-05-31 DIAGNOSIS — D631 Anemia in chronic kidney disease: Secondary | ICD-10-CM | POA: Diagnosis not present

## 2021-05-31 DIAGNOSIS — Z992 Dependence on renal dialysis: Secondary | ICD-10-CM | POA: Diagnosis not present

## 2021-05-31 DIAGNOSIS — N2581 Secondary hyperparathyroidism of renal origin: Secondary | ICD-10-CM | POA: Diagnosis not present

## 2021-06-01 DIAGNOSIS — N186 End stage renal disease: Secondary | ICD-10-CM | POA: Diagnosis not present

## 2021-06-01 DIAGNOSIS — Z4932 Encounter for adequacy testing for peritoneal dialysis: Secondary | ICD-10-CM | POA: Diagnosis not present

## 2021-06-01 DIAGNOSIS — Z992 Dependence on renal dialysis: Secondary | ICD-10-CM | POA: Diagnosis not present

## 2021-06-01 DIAGNOSIS — N2581 Secondary hyperparathyroidism of renal origin: Secondary | ICD-10-CM | POA: Diagnosis not present

## 2021-06-01 DIAGNOSIS — D631 Anemia in chronic kidney disease: Secondary | ICD-10-CM | POA: Diagnosis not present

## 2021-06-02 ENCOUNTER — Encounter: Payer: Self-pay | Admitting: Physician Assistant

## 2021-06-02 DIAGNOSIS — N186 End stage renal disease: Secondary | ICD-10-CM | POA: Diagnosis not present

## 2021-06-02 DIAGNOSIS — N2581 Secondary hyperparathyroidism of renal origin: Secondary | ICD-10-CM | POA: Diagnosis not present

## 2021-06-02 DIAGNOSIS — D631 Anemia in chronic kidney disease: Secondary | ICD-10-CM | POA: Diagnosis not present

## 2021-06-02 DIAGNOSIS — Z4932 Encounter for adequacy testing for peritoneal dialysis: Secondary | ICD-10-CM | POA: Diagnosis not present

## 2021-06-02 DIAGNOSIS — Z992 Dependence on renal dialysis: Secondary | ICD-10-CM | POA: Diagnosis not present

## 2021-06-03 ENCOUNTER — Other Ambulatory Visit: Payer: Self-pay

## 2021-06-03 ENCOUNTER — Other Ambulatory Visit: Payer: Self-pay | Admitting: Cardiology

## 2021-06-03 DIAGNOSIS — Z992 Dependence on renal dialysis: Secondary | ICD-10-CM | POA: Diagnosis not present

## 2021-06-03 DIAGNOSIS — N2581 Secondary hyperparathyroidism of renal origin: Secondary | ICD-10-CM | POA: Diagnosis not present

## 2021-06-03 DIAGNOSIS — N186 End stage renal disease: Secondary | ICD-10-CM | POA: Diagnosis not present

## 2021-06-03 DIAGNOSIS — Z4932 Encounter for adequacy testing for peritoneal dialysis: Secondary | ICD-10-CM | POA: Diagnosis not present

## 2021-06-03 DIAGNOSIS — D631 Anemia in chronic kidney disease: Secondary | ICD-10-CM | POA: Diagnosis not present

## 2021-06-03 MED ORDER — ZOLPIDEM TARTRATE ER 12.5 MG PO TBCR: 12.5000 mg | EXTENDED_RELEASE_TABLET | Freq: Every evening | ORAL | 0 refills | Status: DC | PRN

## 2021-06-03 NOTE — Telephone Encounter (Signed)
[facility] patient is requiring a refill on [place]. Could you see if Dr. Radford Pax would like to refill this.  Please advised. Thanks

## 2021-06-04 DIAGNOSIS — Z4932 Encounter for adequacy testing for peritoneal dialysis: Secondary | ICD-10-CM | POA: Diagnosis not present

## 2021-06-04 DIAGNOSIS — N2581 Secondary hyperparathyroidism of renal origin: Secondary | ICD-10-CM | POA: Diagnosis not present

## 2021-06-04 DIAGNOSIS — D631 Anemia in chronic kidney disease: Secondary | ICD-10-CM | POA: Diagnosis not present

## 2021-06-04 DIAGNOSIS — Z992 Dependence on renal dialysis: Secondary | ICD-10-CM | POA: Diagnosis not present

## 2021-06-04 DIAGNOSIS — N186 End stage renal disease: Secondary | ICD-10-CM | POA: Diagnosis not present

## 2021-06-05 DIAGNOSIS — Z4932 Encounter for adequacy testing for peritoneal dialysis: Secondary | ICD-10-CM | POA: Diagnosis not present

## 2021-06-05 DIAGNOSIS — N186 End stage renal disease: Secondary | ICD-10-CM | POA: Diagnosis not present

## 2021-06-05 DIAGNOSIS — Z992 Dependence on renal dialysis: Secondary | ICD-10-CM | POA: Diagnosis not present

## 2021-06-05 DIAGNOSIS — N2581 Secondary hyperparathyroidism of renal origin: Secondary | ICD-10-CM | POA: Diagnosis not present

## 2021-06-05 DIAGNOSIS — D631 Anemia in chronic kidney disease: Secondary | ICD-10-CM | POA: Diagnosis not present

## 2021-06-06 DIAGNOSIS — N2581 Secondary hyperparathyroidism of renal origin: Secondary | ICD-10-CM | POA: Diagnosis not present

## 2021-06-06 DIAGNOSIS — Z4932 Encounter for adequacy testing for peritoneal dialysis: Secondary | ICD-10-CM | POA: Diagnosis not present

## 2021-06-06 DIAGNOSIS — Z992 Dependence on renal dialysis: Secondary | ICD-10-CM | POA: Diagnosis not present

## 2021-06-06 DIAGNOSIS — N186 End stage renal disease: Secondary | ICD-10-CM | POA: Diagnosis not present

## 2021-06-06 DIAGNOSIS — D631 Anemia in chronic kidney disease: Secondary | ICD-10-CM | POA: Diagnosis not present

## 2021-06-07 DIAGNOSIS — Z4932 Encounter for adequacy testing for peritoneal dialysis: Secondary | ICD-10-CM | POA: Diagnosis not present

## 2021-06-07 DIAGNOSIS — D631 Anemia in chronic kidney disease: Secondary | ICD-10-CM | POA: Diagnosis not present

## 2021-06-07 DIAGNOSIS — N2581 Secondary hyperparathyroidism of renal origin: Secondary | ICD-10-CM | POA: Diagnosis not present

## 2021-06-07 DIAGNOSIS — Z992 Dependence on renal dialysis: Secondary | ICD-10-CM | POA: Diagnosis not present

## 2021-06-07 DIAGNOSIS — N186 End stage renal disease: Secondary | ICD-10-CM | POA: Diagnosis not present

## 2021-06-08 DIAGNOSIS — N2581 Secondary hyperparathyroidism of renal origin: Secondary | ICD-10-CM | POA: Diagnosis not present

## 2021-06-08 DIAGNOSIS — N186 End stage renal disease: Secondary | ICD-10-CM | POA: Diagnosis not present

## 2021-06-08 DIAGNOSIS — Z992 Dependence on renal dialysis: Secondary | ICD-10-CM | POA: Diagnosis not present

## 2021-06-08 DIAGNOSIS — Z4932 Encounter for adequacy testing for peritoneal dialysis: Secondary | ICD-10-CM | POA: Diagnosis not present

## 2021-06-08 DIAGNOSIS — D631 Anemia in chronic kidney disease: Secondary | ICD-10-CM | POA: Diagnosis not present

## 2021-06-09 DIAGNOSIS — Z4932 Encounter for adequacy testing for peritoneal dialysis: Secondary | ICD-10-CM | POA: Diagnosis not present

## 2021-06-09 DIAGNOSIS — D631 Anemia in chronic kidney disease: Secondary | ICD-10-CM | POA: Diagnosis not present

## 2021-06-09 DIAGNOSIS — N186 End stage renal disease: Secondary | ICD-10-CM | POA: Diagnosis not present

## 2021-06-09 DIAGNOSIS — Z992 Dependence on renal dialysis: Secondary | ICD-10-CM | POA: Diagnosis not present

## 2021-06-09 DIAGNOSIS — N2581 Secondary hyperparathyroidism of renal origin: Secondary | ICD-10-CM | POA: Diagnosis not present

## 2021-06-10 DIAGNOSIS — D631 Anemia in chronic kidney disease: Secondary | ICD-10-CM | POA: Diagnosis not present

## 2021-06-10 DIAGNOSIS — Z4932 Encounter for adequacy testing for peritoneal dialysis: Secondary | ICD-10-CM | POA: Diagnosis not present

## 2021-06-10 DIAGNOSIS — N2581 Secondary hyperparathyroidism of renal origin: Secondary | ICD-10-CM | POA: Diagnosis not present

## 2021-06-10 DIAGNOSIS — N186 End stage renal disease: Secondary | ICD-10-CM | POA: Diagnosis not present

## 2021-06-10 DIAGNOSIS — Z992 Dependence on renal dialysis: Secondary | ICD-10-CM | POA: Diagnosis not present

## 2021-06-11 ENCOUNTER — Other Ambulatory Visit: Payer: Self-pay | Admitting: *Deleted

## 2021-06-11 DIAGNOSIS — Z4932 Encounter for adequacy testing for peritoneal dialysis: Secondary | ICD-10-CM | POA: Diagnosis not present

## 2021-06-11 DIAGNOSIS — N2581 Secondary hyperparathyroidism of renal origin: Secondary | ICD-10-CM | POA: Diagnosis not present

## 2021-06-11 DIAGNOSIS — N186 End stage renal disease: Secondary | ICD-10-CM | POA: Diagnosis not present

## 2021-06-11 DIAGNOSIS — D631 Anemia in chronic kidney disease: Secondary | ICD-10-CM | POA: Diagnosis not present

## 2021-06-11 DIAGNOSIS — Z992 Dependence on renal dialysis: Secondary | ICD-10-CM | POA: Diagnosis not present

## 2021-06-11 MED ORDER — ZOLPIDEM TARTRATE ER 12.5 MG PO TBCR: 12.5000 mg | EXTENDED_RELEASE_TABLET | Freq: Every evening | ORAL | 0 refills | Status: DC | PRN

## 2021-06-12 DIAGNOSIS — Z4932 Encounter for adequacy testing for peritoneal dialysis: Secondary | ICD-10-CM | POA: Diagnosis not present

## 2021-06-12 DIAGNOSIS — D631 Anemia in chronic kidney disease: Secondary | ICD-10-CM | POA: Diagnosis not present

## 2021-06-12 DIAGNOSIS — Z992 Dependence on renal dialysis: Secondary | ICD-10-CM | POA: Diagnosis not present

## 2021-06-12 DIAGNOSIS — N186 End stage renal disease: Secondary | ICD-10-CM | POA: Diagnosis not present

## 2021-06-12 DIAGNOSIS — N2581 Secondary hyperparathyroidism of renal origin: Secondary | ICD-10-CM | POA: Diagnosis not present

## 2021-06-13 DIAGNOSIS — N186 End stage renal disease: Secondary | ICD-10-CM | POA: Diagnosis not present

## 2021-06-13 DIAGNOSIS — Z992 Dependence on renal dialysis: Secondary | ICD-10-CM | POA: Diagnosis not present

## 2021-06-13 DIAGNOSIS — D631 Anemia in chronic kidney disease: Secondary | ICD-10-CM | POA: Diagnosis not present

## 2021-06-13 DIAGNOSIS — N2581 Secondary hyperparathyroidism of renal origin: Secondary | ICD-10-CM | POA: Diagnosis not present

## 2021-06-13 DIAGNOSIS — Z4932 Encounter for adequacy testing for peritoneal dialysis: Secondary | ICD-10-CM | POA: Diagnosis not present

## 2021-06-14 DIAGNOSIS — D631 Anemia in chronic kidney disease: Secondary | ICD-10-CM | POA: Diagnosis not present

## 2021-06-14 DIAGNOSIS — N186 End stage renal disease: Secondary | ICD-10-CM | POA: Diagnosis not present

## 2021-06-14 DIAGNOSIS — Z992 Dependence on renal dialysis: Secondary | ICD-10-CM | POA: Diagnosis not present

## 2021-06-14 DIAGNOSIS — N2581 Secondary hyperparathyroidism of renal origin: Secondary | ICD-10-CM | POA: Diagnosis not present

## 2021-06-14 DIAGNOSIS — Z4932 Encounter for adequacy testing for peritoneal dialysis: Secondary | ICD-10-CM | POA: Diagnosis not present

## 2021-06-15 ENCOUNTER — Encounter: Payer: Self-pay | Admitting: Cardiology

## 2021-06-15 DIAGNOSIS — N186 End stage renal disease: Secondary | ICD-10-CM | POA: Diagnosis not present

## 2021-06-15 DIAGNOSIS — Z4932 Encounter for adequacy testing for peritoneal dialysis: Secondary | ICD-10-CM | POA: Diagnosis not present

## 2021-06-15 DIAGNOSIS — D631 Anemia in chronic kidney disease: Secondary | ICD-10-CM | POA: Diagnosis not present

## 2021-06-15 DIAGNOSIS — Z992 Dependence on renal dialysis: Secondary | ICD-10-CM | POA: Diagnosis not present

## 2021-06-15 DIAGNOSIS — E1122 Type 2 diabetes mellitus with diabetic chronic kidney disease: Secondary | ICD-10-CM | POA: Diagnosis not present

## 2021-06-15 DIAGNOSIS — N2581 Secondary hyperparathyroidism of renal origin: Secondary | ICD-10-CM | POA: Diagnosis not present

## 2021-06-16 DIAGNOSIS — N2581 Secondary hyperparathyroidism of renal origin: Secondary | ICD-10-CM | POA: Diagnosis not present

## 2021-06-16 DIAGNOSIS — N186 End stage renal disease: Secondary | ICD-10-CM | POA: Diagnosis not present

## 2021-06-16 DIAGNOSIS — D631 Anemia in chronic kidney disease: Secondary | ICD-10-CM | POA: Diagnosis not present

## 2021-06-16 DIAGNOSIS — Z4932 Encounter for adequacy testing for peritoneal dialysis: Secondary | ICD-10-CM | POA: Diagnosis not present

## 2021-06-16 DIAGNOSIS — Z992 Dependence on renal dialysis: Secondary | ICD-10-CM | POA: Diagnosis not present

## 2021-06-16 MED ORDER — ZOLPIDEM TARTRATE ER 12.5 MG PO TBCR: 12.5000 mg | EXTENDED_RELEASE_TABLET | Freq: Every evening | ORAL | 2 refills | Status: DC | PRN

## 2021-06-16 NOTE — Telephone Encounter (Signed)
Pt's medication was phone into pt's pharmacy, dispensing 30 tablets with 2 refills, to get pt to his appt in April 2023 with Dr. Radford Pax. Pharmacist verbalized understanding and getting pt's medication ready for pt to pick up. Pt notified of this. Pt verbalized understanding as well.

## 2021-06-16 NOTE — Telephone Encounter (Signed)
Pt calling requesting a refill on zolpidem again. Would Dr. Radford Pax like to refill this medication, because is has to be called into the pharmacy. Please address

## 2021-06-17 DIAGNOSIS — D631 Anemia in chronic kidney disease: Secondary | ICD-10-CM | POA: Diagnosis not present

## 2021-06-17 DIAGNOSIS — N186 End stage renal disease: Secondary | ICD-10-CM | POA: Diagnosis not present

## 2021-06-17 DIAGNOSIS — Z4932 Encounter for adequacy testing for peritoneal dialysis: Secondary | ICD-10-CM | POA: Diagnosis not present

## 2021-06-17 DIAGNOSIS — Z992 Dependence on renal dialysis: Secondary | ICD-10-CM | POA: Diagnosis not present

## 2021-06-17 DIAGNOSIS — N2581 Secondary hyperparathyroidism of renal origin: Secondary | ICD-10-CM | POA: Diagnosis not present

## 2021-06-18 DIAGNOSIS — Z4932 Encounter for adequacy testing for peritoneal dialysis: Secondary | ICD-10-CM | POA: Diagnosis not present

## 2021-06-18 DIAGNOSIS — N185 Chronic kidney disease, stage 5: Secondary | ICD-10-CM | POA: Diagnosis not present

## 2021-06-18 DIAGNOSIS — N2581 Secondary hyperparathyroidism of renal origin: Secondary | ICD-10-CM | POA: Diagnosis not present

## 2021-06-18 DIAGNOSIS — Z992 Dependence on renal dialysis: Secondary | ICD-10-CM | POA: Diagnosis not present

## 2021-06-18 DIAGNOSIS — N186 End stage renal disease: Secondary | ICD-10-CM | POA: Diagnosis not present

## 2021-06-18 DIAGNOSIS — D631 Anemia in chronic kidney disease: Secondary | ICD-10-CM | POA: Diagnosis not present

## 2021-06-18 DIAGNOSIS — I10 Essential (primary) hypertension: Secondary | ICD-10-CM | POA: Diagnosis not present

## 2021-06-19 DIAGNOSIS — N186 End stage renal disease: Secondary | ICD-10-CM | POA: Diagnosis not present

## 2021-06-19 DIAGNOSIS — Z992 Dependence on renal dialysis: Secondary | ICD-10-CM | POA: Diagnosis not present

## 2021-06-19 DIAGNOSIS — M546 Pain in thoracic spine: Secondary | ICD-10-CM | POA: Diagnosis not present

## 2021-06-19 DIAGNOSIS — D631 Anemia in chronic kidney disease: Secondary | ICD-10-CM | POA: Diagnosis not present

## 2021-06-19 DIAGNOSIS — N2581 Secondary hyperparathyroidism of renal origin: Secondary | ICD-10-CM | POA: Diagnosis not present

## 2021-06-19 DIAGNOSIS — Z4932 Encounter for adequacy testing for peritoneal dialysis: Secondary | ICD-10-CM | POA: Diagnosis not present

## 2021-06-20 DIAGNOSIS — Z4932 Encounter for adequacy testing for peritoneal dialysis: Secondary | ICD-10-CM | POA: Diagnosis not present

## 2021-06-20 DIAGNOSIS — Z992 Dependence on renal dialysis: Secondary | ICD-10-CM | POA: Diagnosis not present

## 2021-06-20 DIAGNOSIS — N2581 Secondary hyperparathyroidism of renal origin: Secondary | ICD-10-CM | POA: Diagnosis not present

## 2021-06-20 DIAGNOSIS — D631 Anemia in chronic kidney disease: Secondary | ICD-10-CM | POA: Diagnosis not present

## 2021-06-20 DIAGNOSIS — N186 End stage renal disease: Secondary | ICD-10-CM | POA: Diagnosis not present

## 2021-06-21 ENCOUNTER — Encounter: Payer: Self-pay | Admitting: Cardiology

## 2021-06-21 DIAGNOSIS — N186 End stage renal disease: Secondary | ICD-10-CM | POA: Diagnosis not present

## 2021-06-21 DIAGNOSIS — N2581 Secondary hyperparathyroidism of renal origin: Secondary | ICD-10-CM | POA: Diagnosis not present

## 2021-06-21 DIAGNOSIS — Z992 Dependence on renal dialysis: Secondary | ICD-10-CM | POA: Diagnosis not present

## 2021-06-21 DIAGNOSIS — Z4932 Encounter for adequacy testing for peritoneal dialysis: Secondary | ICD-10-CM | POA: Diagnosis not present

## 2021-06-21 DIAGNOSIS — D631 Anemia in chronic kidney disease: Secondary | ICD-10-CM | POA: Diagnosis not present

## 2021-06-22 DIAGNOSIS — N2581 Secondary hyperparathyroidism of renal origin: Secondary | ICD-10-CM | POA: Diagnosis not present

## 2021-06-22 DIAGNOSIS — D631 Anemia in chronic kidney disease: Secondary | ICD-10-CM | POA: Diagnosis not present

## 2021-06-22 DIAGNOSIS — Z4932 Encounter for adequacy testing for peritoneal dialysis: Secondary | ICD-10-CM | POA: Diagnosis not present

## 2021-06-22 DIAGNOSIS — N186 End stage renal disease: Secondary | ICD-10-CM | POA: Diagnosis not present

## 2021-06-22 DIAGNOSIS — Z992 Dependence on renal dialysis: Secondary | ICD-10-CM | POA: Diagnosis not present

## 2021-06-23 ENCOUNTER — Encounter: Payer: Self-pay | Admitting: Cardiology

## 2021-06-23 DIAGNOSIS — N186 End stage renal disease: Secondary | ICD-10-CM | POA: Diagnosis not present

## 2021-06-23 DIAGNOSIS — Z992 Dependence on renal dialysis: Secondary | ICD-10-CM | POA: Diagnosis not present

## 2021-06-23 DIAGNOSIS — D631 Anemia in chronic kidney disease: Secondary | ICD-10-CM | POA: Diagnosis not present

## 2021-06-23 DIAGNOSIS — N2581 Secondary hyperparathyroidism of renal origin: Secondary | ICD-10-CM | POA: Diagnosis not present

## 2021-06-23 DIAGNOSIS — Z4932 Encounter for adequacy testing for peritoneal dialysis: Secondary | ICD-10-CM | POA: Diagnosis not present

## 2021-06-24 DIAGNOSIS — Z4932 Encounter for adequacy testing for peritoneal dialysis: Secondary | ICD-10-CM | POA: Diagnosis not present

## 2021-06-24 DIAGNOSIS — N186 End stage renal disease: Secondary | ICD-10-CM | POA: Diagnosis not present

## 2021-06-24 DIAGNOSIS — D631 Anemia in chronic kidney disease: Secondary | ICD-10-CM | POA: Diagnosis not present

## 2021-06-24 DIAGNOSIS — N2581 Secondary hyperparathyroidism of renal origin: Secondary | ICD-10-CM | POA: Diagnosis not present

## 2021-06-24 DIAGNOSIS — Z992 Dependence on renal dialysis: Secondary | ICD-10-CM | POA: Diagnosis not present

## 2021-06-25 DIAGNOSIS — N186 End stage renal disease: Secondary | ICD-10-CM | POA: Diagnosis not present

## 2021-06-25 DIAGNOSIS — Z4932 Encounter for adequacy testing for peritoneal dialysis: Secondary | ICD-10-CM | POA: Diagnosis not present

## 2021-06-25 DIAGNOSIS — Z992 Dependence on renal dialysis: Secondary | ICD-10-CM | POA: Diagnosis not present

## 2021-06-25 DIAGNOSIS — N2581 Secondary hyperparathyroidism of renal origin: Secondary | ICD-10-CM | POA: Diagnosis not present

## 2021-06-25 DIAGNOSIS — D631 Anemia in chronic kidney disease: Secondary | ICD-10-CM | POA: Diagnosis not present

## 2021-06-26 DIAGNOSIS — N186 End stage renal disease: Secondary | ICD-10-CM | POA: Diagnosis not present

## 2021-06-26 DIAGNOSIS — Z4932 Encounter for adequacy testing for peritoneal dialysis: Secondary | ICD-10-CM | POA: Diagnosis not present

## 2021-06-26 DIAGNOSIS — N2581 Secondary hyperparathyroidism of renal origin: Secondary | ICD-10-CM | POA: Diagnosis not present

## 2021-06-26 DIAGNOSIS — Z992 Dependence on renal dialysis: Secondary | ICD-10-CM | POA: Diagnosis not present

## 2021-06-26 DIAGNOSIS — D631 Anemia in chronic kidney disease: Secondary | ICD-10-CM | POA: Diagnosis not present

## 2021-06-27 DIAGNOSIS — Z4932 Encounter for adequacy testing for peritoneal dialysis: Secondary | ICD-10-CM | POA: Diagnosis not present

## 2021-06-27 DIAGNOSIS — D631 Anemia in chronic kidney disease: Secondary | ICD-10-CM | POA: Diagnosis not present

## 2021-06-27 DIAGNOSIS — N186 End stage renal disease: Secondary | ICD-10-CM | POA: Diagnosis not present

## 2021-06-27 DIAGNOSIS — Z992 Dependence on renal dialysis: Secondary | ICD-10-CM | POA: Diagnosis not present

## 2021-06-27 DIAGNOSIS — N2581 Secondary hyperparathyroidism of renal origin: Secondary | ICD-10-CM | POA: Diagnosis not present

## 2021-06-28 DIAGNOSIS — N186 End stage renal disease: Secondary | ICD-10-CM | POA: Diagnosis not present

## 2021-06-28 DIAGNOSIS — Z992 Dependence on renal dialysis: Secondary | ICD-10-CM | POA: Diagnosis not present

## 2021-06-28 DIAGNOSIS — Z4932 Encounter for adequacy testing for peritoneal dialysis: Secondary | ICD-10-CM | POA: Diagnosis not present

## 2021-06-28 DIAGNOSIS — N2581 Secondary hyperparathyroidism of renal origin: Secondary | ICD-10-CM | POA: Diagnosis not present

## 2021-06-28 DIAGNOSIS — D631 Anemia in chronic kidney disease: Secondary | ICD-10-CM | POA: Diagnosis not present

## 2021-06-29 ENCOUNTER — Other Ambulatory Visit: Payer: Self-pay | Admitting: Cardiology

## 2021-06-29 DIAGNOSIS — Z992 Dependence on renal dialysis: Secondary | ICD-10-CM | POA: Diagnosis not present

## 2021-06-29 DIAGNOSIS — N186 End stage renal disease: Secondary | ICD-10-CM | POA: Diagnosis not present

## 2021-06-29 DIAGNOSIS — D631 Anemia in chronic kidney disease: Secondary | ICD-10-CM | POA: Diagnosis not present

## 2021-06-29 DIAGNOSIS — N2581 Secondary hyperparathyroidism of renal origin: Secondary | ICD-10-CM | POA: Diagnosis not present

## 2021-06-29 DIAGNOSIS — Z4932 Encounter for adequacy testing for peritoneal dialysis: Secondary | ICD-10-CM | POA: Diagnosis not present

## 2021-06-30 DIAGNOSIS — Z992 Dependence on renal dialysis: Secondary | ICD-10-CM | POA: Diagnosis not present

## 2021-06-30 DIAGNOSIS — R143 Flatulence: Secondary | ICD-10-CM | POA: Diagnosis not present

## 2021-06-30 DIAGNOSIS — Z86010 Personal history of colonic polyps: Secondary | ICD-10-CM | POA: Diagnosis not present

## 2021-06-30 DIAGNOSIS — Z4932 Encounter for adequacy testing for peritoneal dialysis: Secondary | ICD-10-CM | POA: Diagnosis not present

## 2021-06-30 DIAGNOSIS — N186 End stage renal disease: Secondary | ICD-10-CM | POA: Diagnosis not present

## 2021-06-30 DIAGNOSIS — R194 Change in bowel habit: Secondary | ICD-10-CM | POA: Diagnosis not present

## 2021-06-30 DIAGNOSIS — D631 Anemia in chronic kidney disease: Secondary | ICD-10-CM | POA: Diagnosis not present

## 2021-06-30 DIAGNOSIS — N2581 Secondary hyperparathyroidism of renal origin: Secondary | ICD-10-CM | POA: Diagnosis not present

## 2021-06-30 DIAGNOSIS — R140 Abdominal distension (gaseous): Secondary | ICD-10-CM | POA: Diagnosis not present

## 2021-07-01 DIAGNOSIS — N2581 Secondary hyperparathyroidism of renal origin: Secondary | ICD-10-CM | POA: Diagnosis not present

## 2021-07-01 DIAGNOSIS — D631 Anemia in chronic kidney disease: Secondary | ICD-10-CM | POA: Diagnosis not present

## 2021-07-01 DIAGNOSIS — N186 End stage renal disease: Secondary | ICD-10-CM | POA: Diagnosis not present

## 2021-07-01 DIAGNOSIS — Z4932 Encounter for adequacy testing for peritoneal dialysis: Secondary | ICD-10-CM | POA: Diagnosis not present

## 2021-07-01 DIAGNOSIS — Z992 Dependence on renal dialysis: Secondary | ICD-10-CM | POA: Diagnosis not present

## 2021-07-02 DIAGNOSIS — D631 Anemia in chronic kidney disease: Secondary | ICD-10-CM | POA: Diagnosis not present

## 2021-07-02 DIAGNOSIS — N2581 Secondary hyperparathyroidism of renal origin: Secondary | ICD-10-CM | POA: Diagnosis not present

## 2021-07-02 DIAGNOSIS — Z4932 Encounter for adequacy testing for peritoneal dialysis: Secondary | ICD-10-CM | POA: Diagnosis not present

## 2021-07-02 DIAGNOSIS — Z992 Dependence on renal dialysis: Secondary | ICD-10-CM | POA: Diagnosis not present

## 2021-07-02 DIAGNOSIS — N186 End stage renal disease: Secondary | ICD-10-CM | POA: Diagnosis not present

## 2021-07-03 DIAGNOSIS — Z4932 Encounter for adequacy testing for peritoneal dialysis: Secondary | ICD-10-CM | POA: Diagnosis not present

## 2021-07-03 DIAGNOSIS — D631 Anemia in chronic kidney disease: Secondary | ICD-10-CM | POA: Diagnosis not present

## 2021-07-03 DIAGNOSIS — Z992 Dependence on renal dialysis: Secondary | ICD-10-CM | POA: Diagnosis not present

## 2021-07-03 DIAGNOSIS — N2581 Secondary hyperparathyroidism of renal origin: Secondary | ICD-10-CM | POA: Diagnosis not present

## 2021-07-03 DIAGNOSIS — N186 End stage renal disease: Secondary | ICD-10-CM | POA: Diagnosis not present

## 2021-07-04 DIAGNOSIS — N186 End stage renal disease: Secondary | ICD-10-CM | POA: Diagnosis not present

## 2021-07-04 DIAGNOSIS — Z4932 Encounter for adequacy testing for peritoneal dialysis: Secondary | ICD-10-CM | POA: Diagnosis not present

## 2021-07-04 DIAGNOSIS — N2581 Secondary hyperparathyroidism of renal origin: Secondary | ICD-10-CM | POA: Diagnosis not present

## 2021-07-04 DIAGNOSIS — Z992 Dependence on renal dialysis: Secondary | ICD-10-CM | POA: Diagnosis not present

## 2021-07-04 DIAGNOSIS — D631 Anemia in chronic kidney disease: Secondary | ICD-10-CM | POA: Diagnosis not present

## 2021-07-05 DIAGNOSIS — N186 End stage renal disease: Secondary | ICD-10-CM | POA: Diagnosis not present

## 2021-07-05 DIAGNOSIS — D631 Anemia in chronic kidney disease: Secondary | ICD-10-CM | POA: Diagnosis not present

## 2021-07-05 DIAGNOSIS — Z992 Dependence on renal dialysis: Secondary | ICD-10-CM | POA: Diagnosis not present

## 2021-07-05 DIAGNOSIS — Z4932 Encounter for adequacy testing for peritoneal dialysis: Secondary | ICD-10-CM | POA: Diagnosis not present

## 2021-07-05 DIAGNOSIS — N2581 Secondary hyperparathyroidism of renal origin: Secondary | ICD-10-CM | POA: Diagnosis not present

## 2021-07-06 DIAGNOSIS — Z4932 Encounter for adequacy testing for peritoneal dialysis: Secondary | ICD-10-CM | POA: Diagnosis not present

## 2021-07-06 DIAGNOSIS — N2581 Secondary hyperparathyroidism of renal origin: Secondary | ICD-10-CM | POA: Diagnosis not present

## 2021-07-06 DIAGNOSIS — N186 End stage renal disease: Secondary | ICD-10-CM | POA: Diagnosis not present

## 2021-07-06 DIAGNOSIS — D631 Anemia in chronic kidney disease: Secondary | ICD-10-CM | POA: Diagnosis not present

## 2021-07-06 DIAGNOSIS — Z992 Dependence on renal dialysis: Secondary | ICD-10-CM | POA: Diagnosis not present

## 2021-07-07 DIAGNOSIS — N2581 Secondary hyperparathyroidism of renal origin: Secondary | ICD-10-CM | POA: Diagnosis not present

## 2021-07-07 DIAGNOSIS — Z992 Dependence on renal dialysis: Secondary | ICD-10-CM | POA: Diagnosis not present

## 2021-07-07 DIAGNOSIS — N186 End stage renal disease: Secondary | ICD-10-CM | POA: Diagnosis not present

## 2021-07-07 DIAGNOSIS — Z4932 Encounter for adequacy testing for peritoneal dialysis: Secondary | ICD-10-CM | POA: Diagnosis not present

## 2021-07-07 DIAGNOSIS — D631 Anemia in chronic kidney disease: Secondary | ICD-10-CM | POA: Diagnosis not present

## 2021-07-08 ENCOUNTER — Telehealth: Payer: Self-pay | Admitting: Family Medicine

## 2021-07-08 DIAGNOSIS — N186 End stage renal disease: Secondary | ICD-10-CM | POA: Diagnosis not present

## 2021-07-08 DIAGNOSIS — Z992 Dependence on renal dialysis: Secondary | ICD-10-CM | POA: Diagnosis not present

## 2021-07-08 DIAGNOSIS — N2581 Secondary hyperparathyroidism of renal origin: Secondary | ICD-10-CM | POA: Diagnosis not present

## 2021-07-08 DIAGNOSIS — Z4932 Encounter for adequacy testing for peritoneal dialysis: Secondary | ICD-10-CM | POA: Diagnosis not present

## 2021-07-08 DIAGNOSIS — D631 Anemia in chronic kidney disease: Secondary | ICD-10-CM | POA: Diagnosis not present

## 2021-07-08 NOTE — Telephone Encounter (Signed)
Left message for patient to call back and schedule Medicare Annual Wellness Visit (AWV) either virtually or in office. I left my number for patient to call [phone].  BEFORE SCHEDULING PLEASE CONFIRM BCBS MEDICARE IS PRIMARY AND FED BCBS IS 2ND   awvi 07/15/19 per [facility] please schedule at anytime with health coach  This should be a 45 minute visit.

## 2021-07-09 DIAGNOSIS — N186 End stage renal disease: Secondary | ICD-10-CM | POA: Diagnosis not present

## 2021-07-09 DIAGNOSIS — N2581 Secondary hyperparathyroidism of renal origin: Secondary | ICD-10-CM | POA: Diagnosis not present

## 2021-07-09 DIAGNOSIS — Z992 Dependence on renal dialysis: Secondary | ICD-10-CM | POA: Diagnosis not present

## 2021-07-09 DIAGNOSIS — Z4932 Encounter for adequacy testing for peritoneal dialysis: Secondary | ICD-10-CM | POA: Diagnosis not present

## 2021-07-09 DIAGNOSIS — D631 Anemia in chronic kidney disease: Secondary | ICD-10-CM | POA: Diagnosis not present

## 2021-07-10 DIAGNOSIS — N186 End stage renal disease: Secondary | ICD-10-CM | POA: Diagnosis not present

## 2021-07-10 DIAGNOSIS — Z992 Dependence on renal dialysis: Secondary | ICD-10-CM | POA: Diagnosis not present

## 2021-07-10 DIAGNOSIS — Z4932 Encounter for adequacy testing for peritoneal dialysis: Secondary | ICD-10-CM | POA: Diagnosis not present

## 2021-07-10 DIAGNOSIS — N2581 Secondary hyperparathyroidism of renal origin: Secondary | ICD-10-CM | POA: Diagnosis not present

## 2021-07-10 DIAGNOSIS — D631 Anemia in chronic kidney disease: Secondary | ICD-10-CM | POA: Diagnosis not present

## 2021-07-11 DIAGNOSIS — N186 End stage renal disease: Secondary | ICD-10-CM | POA: Diagnosis not present

## 2021-07-11 DIAGNOSIS — N2581 Secondary hyperparathyroidism of renal origin: Secondary | ICD-10-CM | POA: Diagnosis not present

## 2021-07-11 DIAGNOSIS — D631 Anemia in chronic kidney disease: Secondary | ICD-10-CM | POA: Diagnosis not present

## 2021-07-11 DIAGNOSIS — Z992 Dependence on renal dialysis: Secondary | ICD-10-CM | POA: Diagnosis not present

## 2021-07-11 DIAGNOSIS — Z4932 Encounter for adequacy testing for peritoneal dialysis: Secondary | ICD-10-CM | POA: Diagnosis not present

## 2021-07-12 DIAGNOSIS — Z992 Dependence on renal dialysis: Secondary | ICD-10-CM | POA: Diagnosis not present

## 2021-07-12 DIAGNOSIS — N2581 Secondary hyperparathyroidism of renal origin: Secondary | ICD-10-CM | POA: Diagnosis not present

## 2021-07-12 DIAGNOSIS — D631 Anemia in chronic kidney disease: Secondary | ICD-10-CM | POA: Diagnosis not present

## 2021-07-12 DIAGNOSIS — Z4932 Encounter for adequacy testing for peritoneal dialysis: Secondary | ICD-10-CM | POA: Diagnosis not present

## 2021-07-12 DIAGNOSIS — N186 End stage renal disease: Secondary | ICD-10-CM | POA: Diagnosis not present

## 2021-07-13 DIAGNOSIS — D631 Anemia in chronic kidney disease: Secondary | ICD-10-CM | POA: Diagnosis not present

## 2021-07-13 DIAGNOSIS — Z4932 Encounter for adequacy testing for peritoneal dialysis: Secondary | ICD-10-CM | POA: Diagnosis not present

## 2021-07-13 DIAGNOSIS — Z992 Dependence on renal dialysis: Secondary | ICD-10-CM | POA: Diagnosis not present

## 2021-07-13 DIAGNOSIS — E1122 Type 2 diabetes mellitus with diabetic chronic kidney disease: Secondary | ICD-10-CM | POA: Diagnosis not present

## 2021-07-13 DIAGNOSIS — N186 End stage renal disease: Secondary | ICD-10-CM | POA: Diagnosis not present

## 2021-07-13 DIAGNOSIS — N2581 Secondary hyperparathyroidism of renal origin: Secondary | ICD-10-CM | POA: Diagnosis not present

## 2021-07-14 DIAGNOSIS — N2581 Secondary hyperparathyroidism of renal origin: Secondary | ICD-10-CM | POA: Diagnosis not present

## 2021-07-14 DIAGNOSIS — D631 Anemia in chronic kidney disease: Secondary | ICD-10-CM | POA: Diagnosis not present

## 2021-07-14 DIAGNOSIS — Z992 Dependence on renal dialysis: Secondary | ICD-10-CM | POA: Diagnosis not present

## 2021-07-14 DIAGNOSIS — N186 End stage renal disease: Secondary | ICD-10-CM | POA: Diagnosis not present

## 2021-07-14 DIAGNOSIS — Z4932 Encounter for adequacy testing for peritoneal dialysis: Secondary | ICD-10-CM | POA: Diagnosis not present

## 2021-07-15 DIAGNOSIS — Z4932 Encounter for adequacy testing for peritoneal dialysis: Secondary | ICD-10-CM | POA: Diagnosis not present

## 2021-07-15 DIAGNOSIS — N2581 Secondary hyperparathyroidism of renal origin: Secondary | ICD-10-CM | POA: Diagnosis not present

## 2021-07-15 DIAGNOSIS — D631 Anemia in chronic kidney disease: Secondary | ICD-10-CM | POA: Diagnosis not present

## 2021-07-15 DIAGNOSIS — N186 End stage renal disease: Secondary | ICD-10-CM | POA: Diagnosis not present

## 2021-07-15 DIAGNOSIS — Z992 Dependence on renal dialysis: Secondary | ICD-10-CM | POA: Diagnosis not present

## 2021-07-16 DIAGNOSIS — N186 End stage renal disease: Secondary | ICD-10-CM | POA: Diagnosis not present

## 2021-07-16 DIAGNOSIS — D631 Anemia in chronic kidney disease: Secondary | ICD-10-CM | POA: Diagnosis not present

## 2021-07-16 DIAGNOSIS — N2581 Secondary hyperparathyroidism of renal origin: Secondary | ICD-10-CM | POA: Diagnosis not present

## 2021-07-16 DIAGNOSIS — Z4932 Encounter for adequacy testing for peritoneal dialysis: Secondary | ICD-10-CM | POA: Diagnosis not present

## 2021-07-16 DIAGNOSIS — Z992 Dependence on renal dialysis: Secondary | ICD-10-CM | POA: Diagnosis not present

## 2021-07-17 DIAGNOSIS — Z4932 Encounter for adequacy testing for peritoneal dialysis: Secondary | ICD-10-CM | POA: Diagnosis not present

## 2021-07-17 DIAGNOSIS — D631 Anemia in chronic kidney disease: Secondary | ICD-10-CM | POA: Diagnosis not present

## 2021-07-17 DIAGNOSIS — N2581 Secondary hyperparathyroidism of renal origin: Secondary | ICD-10-CM | POA: Diagnosis not present

## 2021-07-17 DIAGNOSIS — Z992 Dependence on renal dialysis: Secondary | ICD-10-CM | POA: Diagnosis not present

## 2021-07-17 DIAGNOSIS — N186 End stage renal disease: Secondary | ICD-10-CM | POA: Diagnosis not present

## 2021-07-18 DIAGNOSIS — N2581 Secondary hyperparathyroidism of renal origin: Secondary | ICD-10-CM | POA: Diagnosis not present

## 2021-07-18 DIAGNOSIS — Z992 Dependence on renal dialysis: Secondary | ICD-10-CM | POA: Diagnosis not present

## 2021-07-18 DIAGNOSIS — N186 End stage renal disease: Secondary | ICD-10-CM | POA: Diagnosis not present

## 2021-07-18 DIAGNOSIS — Z4932 Encounter for adequacy testing for peritoneal dialysis: Secondary | ICD-10-CM | POA: Diagnosis not present

## 2021-07-18 DIAGNOSIS — D631 Anemia in chronic kidney disease: Secondary | ICD-10-CM | POA: Diagnosis not present

## 2021-07-19 DIAGNOSIS — Z992 Dependence on renal dialysis: Secondary | ICD-10-CM | POA: Diagnosis not present

## 2021-07-19 DIAGNOSIS — Z4932 Encounter for adequacy testing for peritoneal dialysis: Secondary | ICD-10-CM | POA: Diagnosis not present

## 2021-07-19 DIAGNOSIS — N186 End stage renal disease: Secondary | ICD-10-CM | POA: Diagnosis not present

## 2021-07-19 DIAGNOSIS — N2581 Secondary hyperparathyroidism of renal origin: Secondary | ICD-10-CM | POA: Diagnosis not present

## 2021-07-19 DIAGNOSIS — D631 Anemia in chronic kidney disease: Secondary | ICD-10-CM | POA: Diagnosis not present

## 2021-07-20 DIAGNOSIS — N186 End stage renal disease: Secondary | ICD-10-CM | POA: Diagnosis not present

## 2021-07-20 DIAGNOSIS — D631 Anemia in chronic kidney disease: Secondary | ICD-10-CM | POA: Diagnosis not present

## 2021-07-20 DIAGNOSIS — Z4932 Encounter for adequacy testing for peritoneal dialysis: Secondary | ICD-10-CM | POA: Diagnosis not present

## 2021-07-20 DIAGNOSIS — N2581 Secondary hyperparathyroidism of renal origin: Secondary | ICD-10-CM | POA: Diagnosis not present

## 2021-07-20 DIAGNOSIS — Z992 Dependence on renal dialysis: Secondary | ICD-10-CM | POA: Diagnosis not present

## 2021-07-21 DIAGNOSIS — Z4932 Encounter for adequacy testing for peritoneal dialysis: Secondary | ICD-10-CM | POA: Diagnosis not present

## 2021-07-21 DIAGNOSIS — N186 End stage renal disease: Secondary | ICD-10-CM | POA: Diagnosis not present

## 2021-07-21 DIAGNOSIS — D631 Anemia in chronic kidney disease: Secondary | ICD-10-CM | POA: Diagnosis not present

## 2021-07-21 DIAGNOSIS — Z992 Dependence on renal dialysis: Secondary | ICD-10-CM | POA: Diagnosis not present

## 2021-07-21 DIAGNOSIS — N2581 Secondary hyperparathyroidism of renal origin: Secondary | ICD-10-CM | POA: Diagnosis not present

## 2021-07-22 DIAGNOSIS — N186 End stage renal disease: Secondary | ICD-10-CM | POA: Diagnosis not present

## 2021-07-22 DIAGNOSIS — Z992 Dependence on renal dialysis: Secondary | ICD-10-CM | POA: Diagnosis not present

## 2021-07-22 DIAGNOSIS — Z4932 Encounter for adequacy testing for peritoneal dialysis: Secondary | ICD-10-CM | POA: Diagnosis not present

## 2021-07-22 DIAGNOSIS — D631 Anemia in chronic kidney disease: Secondary | ICD-10-CM | POA: Diagnosis not present

## 2021-07-22 DIAGNOSIS — N2581 Secondary hyperparathyroidism of renal origin: Secondary | ICD-10-CM | POA: Diagnosis not present

## 2021-07-23 DIAGNOSIS — N186 End stage renal disease: Secondary | ICD-10-CM | POA: Diagnosis not present

## 2021-07-23 DIAGNOSIS — N2581 Secondary hyperparathyroidism of renal origin: Secondary | ICD-10-CM | POA: Diagnosis not present

## 2021-07-23 DIAGNOSIS — Z4932 Encounter for adequacy testing for peritoneal dialysis: Secondary | ICD-10-CM | POA: Diagnosis not present

## 2021-07-23 DIAGNOSIS — D631 Anemia in chronic kidney disease: Secondary | ICD-10-CM | POA: Diagnosis not present

## 2021-07-23 DIAGNOSIS — Z992 Dependence on renal dialysis: Secondary | ICD-10-CM | POA: Diagnosis not present

## 2021-07-24 DIAGNOSIS — N2581 Secondary hyperparathyroidism of renal origin: Secondary | ICD-10-CM | POA: Diagnosis not present

## 2021-07-24 DIAGNOSIS — Z4932 Encounter for adequacy testing for peritoneal dialysis: Secondary | ICD-10-CM | POA: Diagnosis not present

## 2021-07-24 DIAGNOSIS — N186 End stage renal disease: Secondary | ICD-10-CM | POA: Diagnosis not present

## 2021-07-24 DIAGNOSIS — Z992 Dependence on renal dialysis: Secondary | ICD-10-CM | POA: Diagnosis not present

## 2021-07-24 DIAGNOSIS — D631 Anemia in chronic kidney disease: Secondary | ICD-10-CM | POA: Diagnosis not present

## 2021-07-25 DIAGNOSIS — Z992 Dependence on renal dialysis: Secondary | ICD-10-CM | POA: Diagnosis not present

## 2021-07-25 DIAGNOSIS — N186 End stage renal disease: Secondary | ICD-10-CM | POA: Diagnosis not present

## 2021-07-25 DIAGNOSIS — Z4932 Encounter for adequacy testing for peritoneal dialysis: Secondary | ICD-10-CM | POA: Diagnosis not present

## 2021-07-25 DIAGNOSIS — N2581 Secondary hyperparathyroidism of renal origin: Secondary | ICD-10-CM | POA: Diagnosis not present

## 2021-07-25 DIAGNOSIS — D631 Anemia in chronic kidney disease: Secondary | ICD-10-CM | POA: Diagnosis not present

## 2021-07-26 DIAGNOSIS — Z4932 Encounter for adequacy testing for peritoneal dialysis: Secondary | ICD-10-CM | POA: Diagnosis not present

## 2021-07-26 DIAGNOSIS — D631 Anemia in chronic kidney disease: Secondary | ICD-10-CM | POA: Diagnosis not present

## 2021-07-26 DIAGNOSIS — N2581 Secondary hyperparathyroidism of renal origin: Secondary | ICD-10-CM | POA: Diagnosis not present

## 2021-07-26 DIAGNOSIS — N186 End stage renal disease: Secondary | ICD-10-CM | POA: Diagnosis not present

## 2021-07-26 DIAGNOSIS — Z992 Dependence on renal dialysis: Secondary | ICD-10-CM | POA: Diagnosis not present

## 2021-07-27 ENCOUNTER — Telehealth: Payer: Self-pay | Admitting: Family Medicine

## 2021-07-27 DIAGNOSIS — N2581 Secondary hyperparathyroidism of renal origin: Secondary | ICD-10-CM | POA: Diagnosis not present

## 2021-07-27 DIAGNOSIS — D631 Anemia in chronic kidney disease: Secondary | ICD-10-CM | POA: Diagnosis not present

## 2021-07-27 DIAGNOSIS — N186 End stage renal disease: Secondary | ICD-10-CM | POA: Diagnosis not present

## 2021-07-27 DIAGNOSIS — Z4932 Encounter for adequacy testing for peritoneal dialysis: Secondary | ICD-10-CM | POA: Diagnosis not present

## 2021-07-27 DIAGNOSIS — Z992 Dependence on renal dialysis: Secondary | ICD-10-CM | POA: Diagnosis not present

## 2021-07-27 NOTE — Chronic Care Management (AMB) (Signed)
?  Chronic Care Management  ? ?Outreach Note ? ?07/27/2021 ?Name: Kyren Knick MRN: [id number] DOB: Feb 14, 1971 ? ?Referred by: Girtha Rm, NP-C ?Reason for referral : No chief complaint on file. ? ? ?An unsuccessful telephone outreach was attempted today. The patient was referred to the pharmacist for assistance with care management and care coordination.  ? ?Follow Up Plan:  ? ?Tatjana Dellinger ?Upstream Scheduler  ?

## 2021-07-27 NOTE — Telephone Encounter (Signed)
Left message for patient to call back and schedule Medicare Annual Wellness Visit (AWV) either virtually or in office. I left my number for patient to call [phone].   awvi 07/15/19 per [facility]   please schedule at anytime with health coach  This should be a 45 minute visit.   

## 2021-07-28 DIAGNOSIS — N186 End stage renal disease: Secondary | ICD-10-CM | POA: Diagnosis not present

## 2021-07-28 DIAGNOSIS — Z992 Dependence on renal dialysis: Secondary | ICD-10-CM | POA: Diagnosis not present

## 2021-07-28 DIAGNOSIS — D631 Anemia in chronic kidney disease: Secondary | ICD-10-CM | POA: Diagnosis not present

## 2021-07-28 DIAGNOSIS — Z4932 Encounter for adequacy testing for peritoneal dialysis: Secondary | ICD-10-CM | POA: Diagnosis not present

## 2021-07-28 DIAGNOSIS — N2581 Secondary hyperparathyroidism of renal origin: Secondary | ICD-10-CM | POA: Diagnosis not present

## 2021-07-29 DIAGNOSIS — Z4932 Encounter for adequacy testing for peritoneal dialysis: Secondary | ICD-10-CM | POA: Diagnosis not present

## 2021-07-29 DIAGNOSIS — N2581 Secondary hyperparathyroidism of renal origin: Secondary | ICD-10-CM | POA: Diagnosis not present

## 2021-07-29 DIAGNOSIS — N186 End stage renal disease: Secondary | ICD-10-CM | POA: Diagnosis not present

## 2021-07-29 DIAGNOSIS — D631 Anemia in chronic kidney disease: Secondary | ICD-10-CM | POA: Diagnosis not present

## 2021-07-29 DIAGNOSIS — Z992 Dependence on renal dialysis: Secondary | ICD-10-CM | POA: Diagnosis not present

## 2021-07-30 DIAGNOSIS — N186 End stage renal disease: Secondary | ICD-10-CM | POA: Diagnosis not present

## 2021-07-30 DIAGNOSIS — D631 Anemia in chronic kidney disease: Secondary | ICD-10-CM | POA: Diagnosis not present

## 2021-07-30 DIAGNOSIS — Z4932 Encounter for adequacy testing for peritoneal dialysis: Secondary | ICD-10-CM | POA: Diagnosis not present

## 2021-07-30 DIAGNOSIS — N2581 Secondary hyperparathyroidism of renal origin: Secondary | ICD-10-CM | POA: Diagnosis not present

## 2021-07-30 DIAGNOSIS — Z992 Dependence on renal dialysis: Secondary | ICD-10-CM | POA: Diagnosis not present

## 2021-07-31 DIAGNOSIS — Z992 Dependence on renal dialysis: Secondary | ICD-10-CM | POA: Diagnosis not present

## 2021-07-31 DIAGNOSIS — Z4932 Encounter for adequacy testing for peritoneal dialysis: Secondary | ICD-10-CM | POA: Diagnosis not present

## 2021-07-31 DIAGNOSIS — N186 End stage renal disease: Secondary | ICD-10-CM | POA: Diagnosis not present

## 2021-07-31 DIAGNOSIS — N2581 Secondary hyperparathyroidism of renal origin: Secondary | ICD-10-CM | POA: Diagnosis not present

## 2021-07-31 DIAGNOSIS — D631 Anemia in chronic kidney disease: Secondary | ICD-10-CM | POA: Diagnosis not present

## 2021-08-01 DIAGNOSIS — Z4932 Encounter for adequacy testing for peritoneal dialysis: Secondary | ICD-10-CM | POA: Diagnosis not present

## 2021-08-01 DIAGNOSIS — D631 Anemia in chronic kidney disease: Secondary | ICD-10-CM | POA: Diagnosis not present

## 2021-08-01 DIAGNOSIS — N2581 Secondary hyperparathyroidism of renal origin: Secondary | ICD-10-CM | POA: Diagnosis not present

## 2021-08-01 DIAGNOSIS — N186 End stage renal disease: Secondary | ICD-10-CM | POA: Diagnosis not present

## 2021-08-01 DIAGNOSIS — Z992 Dependence on renal dialysis: Secondary | ICD-10-CM | POA: Diagnosis not present

## 2021-08-02 DIAGNOSIS — Z4932 Encounter for adequacy testing for peritoneal dialysis: Secondary | ICD-10-CM | POA: Diagnosis not present

## 2021-08-02 DIAGNOSIS — N186 End stage renal disease: Secondary | ICD-10-CM | POA: Diagnosis not present

## 2021-08-02 DIAGNOSIS — Z992 Dependence on renal dialysis: Secondary | ICD-10-CM | POA: Diagnosis not present

## 2021-08-02 DIAGNOSIS — N2581 Secondary hyperparathyroidism of renal origin: Secondary | ICD-10-CM | POA: Diagnosis not present

## 2021-08-02 DIAGNOSIS — D631 Anemia in chronic kidney disease: Secondary | ICD-10-CM | POA: Diagnosis not present

## 2021-08-03 DIAGNOSIS — Z4932 Encounter for adequacy testing for peritoneal dialysis: Secondary | ICD-10-CM | POA: Diagnosis not present

## 2021-08-03 DIAGNOSIS — Z992 Dependence on renal dialysis: Secondary | ICD-10-CM | POA: Diagnosis not present

## 2021-08-03 DIAGNOSIS — N2581 Secondary hyperparathyroidism of renal origin: Secondary | ICD-10-CM | POA: Diagnosis not present

## 2021-08-03 DIAGNOSIS — N186 End stage renal disease: Secondary | ICD-10-CM | POA: Diagnosis not present

## 2021-08-03 DIAGNOSIS — D631 Anemia in chronic kidney disease: Secondary | ICD-10-CM | POA: Diagnosis not present

## 2021-08-04 DIAGNOSIS — N186 End stage renal disease: Secondary | ICD-10-CM | POA: Diagnosis not present

## 2021-08-04 DIAGNOSIS — Z4932 Encounter for adequacy testing for peritoneal dialysis: Secondary | ICD-10-CM | POA: Diagnosis not present

## 2021-08-04 DIAGNOSIS — N2581 Secondary hyperparathyroidism of renal origin: Secondary | ICD-10-CM | POA: Diagnosis not present

## 2021-08-04 DIAGNOSIS — D631 Anemia in chronic kidney disease: Secondary | ICD-10-CM | POA: Diagnosis not present

## 2021-08-04 DIAGNOSIS — Z992 Dependence on renal dialysis: Secondary | ICD-10-CM | POA: Diagnosis not present

## 2021-08-05 DIAGNOSIS — N186 End stage renal disease: Secondary | ICD-10-CM | POA: Diagnosis not present

## 2021-08-05 DIAGNOSIS — Z4932 Encounter for adequacy testing for peritoneal dialysis: Secondary | ICD-10-CM | POA: Diagnosis not present

## 2021-08-05 DIAGNOSIS — D631 Anemia in chronic kidney disease: Secondary | ICD-10-CM | POA: Diagnosis not present

## 2021-08-05 DIAGNOSIS — Z992 Dependence on renal dialysis: Secondary | ICD-10-CM | POA: Diagnosis not present

## 2021-08-05 DIAGNOSIS — N2581 Secondary hyperparathyroidism of renal origin: Secondary | ICD-10-CM | POA: Diagnosis not present

## 2021-08-06 DIAGNOSIS — N2581 Secondary hyperparathyroidism of renal origin: Secondary | ICD-10-CM | POA: Diagnosis not present

## 2021-08-06 DIAGNOSIS — Z992 Dependence on renal dialysis: Secondary | ICD-10-CM | POA: Diagnosis not present

## 2021-08-06 DIAGNOSIS — D631 Anemia in chronic kidney disease: Secondary | ICD-10-CM | POA: Diagnosis not present

## 2021-08-06 DIAGNOSIS — Z4932 Encounter for adequacy testing for peritoneal dialysis: Secondary | ICD-10-CM | POA: Diagnosis not present

## 2021-08-06 DIAGNOSIS — N186 End stage renal disease: Secondary | ICD-10-CM | POA: Diagnosis not present

## 2021-08-07 DIAGNOSIS — N2581 Secondary hyperparathyroidism of renal origin: Secondary | ICD-10-CM | POA: Diagnosis not present

## 2021-08-07 DIAGNOSIS — D631 Anemia in chronic kidney disease: Secondary | ICD-10-CM | POA: Diagnosis not present

## 2021-08-07 DIAGNOSIS — N186 End stage renal disease: Secondary | ICD-10-CM | POA: Diagnosis not present

## 2021-08-07 DIAGNOSIS — Z992 Dependence on renal dialysis: Secondary | ICD-10-CM | POA: Diagnosis not present

## 2021-08-07 DIAGNOSIS — Z4932 Encounter for adequacy testing for peritoneal dialysis: Secondary | ICD-10-CM | POA: Diagnosis not present

## 2021-08-08 DIAGNOSIS — Z992 Dependence on renal dialysis: Secondary | ICD-10-CM | POA: Diagnosis not present

## 2021-08-08 DIAGNOSIS — D631 Anemia in chronic kidney disease: Secondary | ICD-10-CM | POA: Diagnosis not present

## 2021-08-08 DIAGNOSIS — N2581 Secondary hyperparathyroidism of renal origin: Secondary | ICD-10-CM | POA: Diagnosis not present

## 2021-08-08 DIAGNOSIS — Z4932 Encounter for adequacy testing for peritoneal dialysis: Secondary | ICD-10-CM | POA: Diagnosis not present

## 2021-08-08 DIAGNOSIS — N186 End stage renal disease: Secondary | ICD-10-CM | POA: Diagnosis not present

## 2021-08-09 DIAGNOSIS — Z992 Dependence on renal dialysis: Secondary | ICD-10-CM | POA: Diagnosis not present

## 2021-08-09 DIAGNOSIS — N2581 Secondary hyperparathyroidism of renal origin: Secondary | ICD-10-CM | POA: Diagnosis not present

## 2021-08-09 DIAGNOSIS — Z4932 Encounter for adequacy testing for peritoneal dialysis: Secondary | ICD-10-CM | POA: Diagnosis not present

## 2021-08-09 DIAGNOSIS — D631 Anemia in chronic kidney disease: Secondary | ICD-10-CM | POA: Diagnosis not present

## 2021-08-09 DIAGNOSIS — N186 End stage renal disease: Secondary | ICD-10-CM | POA: Diagnosis not present

## 2021-08-10 DIAGNOSIS — Z992 Dependence on renal dialysis: Secondary | ICD-10-CM | POA: Diagnosis not present

## 2021-08-10 DIAGNOSIS — N2581 Secondary hyperparathyroidism of renal origin: Secondary | ICD-10-CM | POA: Diagnosis not present

## 2021-08-10 DIAGNOSIS — Z4932 Encounter for adequacy testing for peritoneal dialysis: Secondary | ICD-10-CM | POA: Diagnosis not present

## 2021-08-10 DIAGNOSIS — G4733 Obstructive sleep apnea (adult) (pediatric): Secondary | ICD-10-CM | POA: Diagnosis not present

## 2021-08-10 DIAGNOSIS — N186 End stage renal disease: Secondary | ICD-10-CM | POA: Diagnosis not present

## 2021-08-10 DIAGNOSIS — D631 Anemia in chronic kidney disease: Secondary | ICD-10-CM | POA: Diagnosis not present

## 2021-08-11 DIAGNOSIS — Z4932 Encounter for adequacy testing for peritoneal dialysis: Secondary | ICD-10-CM | POA: Diagnosis not present

## 2021-08-11 DIAGNOSIS — N186 End stage renal disease: Secondary | ICD-10-CM | POA: Diagnosis not present

## 2021-08-11 DIAGNOSIS — D631 Anemia in chronic kidney disease: Secondary | ICD-10-CM | POA: Diagnosis not present

## 2021-08-11 DIAGNOSIS — N2581 Secondary hyperparathyroidism of renal origin: Secondary | ICD-10-CM | POA: Diagnosis not present

## 2021-08-11 DIAGNOSIS — Z992 Dependence on renal dialysis: Secondary | ICD-10-CM | POA: Diagnosis not present

## 2021-08-12 DIAGNOSIS — N2581 Secondary hyperparathyroidism of renal origin: Secondary | ICD-10-CM | POA: Diagnosis not present

## 2021-08-12 DIAGNOSIS — D631 Anemia in chronic kidney disease: Secondary | ICD-10-CM | POA: Diagnosis not present

## 2021-08-12 DIAGNOSIS — N186 End stage renal disease: Secondary | ICD-10-CM | POA: Diagnosis not present

## 2021-08-12 DIAGNOSIS — Z992 Dependence on renal dialysis: Secondary | ICD-10-CM | POA: Diagnosis not present

## 2021-08-12 DIAGNOSIS — Z4932 Encounter for adequacy testing for peritoneal dialysis: Secondary | ICD-10-CM | POA: Diagnosis not present

## 2021-08-13 DIAGNOSIS — D631 Anemia in chronic kidney disease: Secondary | ICD-10-CM | POA: Diagnosis not present

## 2021-08-13 DIAGNOSIS — N2581 Secondary hyperparathyroidism of renal origin: Secondary | ICD-10-CM | POA: Diagnosis not present

## 2021-08-13 DIAGNOSIS — Z992 Dependence on renal dialysis: Secondary | ICD-10-CM | POA: Diagnosis not present

## 2021-08-13 DIAGNOSIS — Z4932 Encounter for adequacy testing for peritoneal dialysis: Secondary | ICD-10-CM | POA: Diagnosis not present

## 2021-08-13 DIAGNOSIS — N186 End stage renal disease: Secondary | ICD-10-CM | POA: Diagnosis not present

## 2021-08-13 DIAGNOSIS — E1122 Type 2 diabetes mellitus with diabetic chronic kidney disease: Secondary | ICD-10-CM | POA: Diagnosis not present

## 2021-08-14 DIAGNOSIS — N186 End stage renal disease: Secondary | ICD-10-CM | POA: Diagnosis not present

## 2021-08-14 DIAGNOSIS — N2581 Secondary hyperparathyroidism of renal origin: Secondary | ICD-10-CM | POA: Diagnosis not present

## 2021-08-14 DIAGNOSIS — D631 Anemia in chronic kidney disease: Secondary | ICD-10-CM | POA: Diagnosis not present

## 2021-08-14 DIAGNOSIS — Z992 Dependence on renal dialysis: Secondary | ICD-10-CM | POA: Diagnosis not present

## 2021-08-14 DIAGNOSIS — Z4932 Encounter for adequacy testing for peritoneal dialysis: Secondary | ICD-10-CM | POA: Diagnosis not present

## 2021-08-15 DIAGNOSIS — Z992 Dependence on renal dialysis: Secondary | ICD-10-CM | POA: Diagnosis not present

## 2021-08-15 DIAGNOSIS — N2581 Secondary hyperparathyroidism of renal origin: Secondary | ICD-10-CM | POA: Diagnosis not present

## 2021-08-15 DIAGNOSIS — N186 End stage renal disease: Secondary | ICD-10-CM | POA: Diagnosis not present

## 2021-08-15 DIAGNOSIS — D631 Anemia in chronic kidney disease: Secondary | ICD-10-CM | POA: Diagnosis not present

## 2021-08-15 DIAGNOSIS — Z4932 Encounter for adequacy testing for peritoneal dialysis: Secondary | ICD-10-CM | POA: Diagnosis not present

## 2021-08-16 DIAGNOSIS — N2581 Secondary hyperparathyroidism of renal origin: Secondary | ICD-10-CM | POA: Diagnosis not present

## 2021-08-16 DIAGNOSIS — Z992 Dependence on renal dialysis: Secondary | ICD-10-CM | POA: Diagnosis not present

## 2021-08-16 DIAGNOSIS — Z4932 Encounter for adequacy testing for peritoneal dialysis: Secondary | ICD-10-CM | POA: Diagnosis not present

## 2021-08-16 DIAGNOSIS — N186 End stage renal disease: Secondary | ICD-10-CM | POA: Diagnosis not present

## 2021-08-16 DIAGNOSIS — D631 Anemia in chronic kidney disease: Secondary | ICD-10-CM | POA: Diagnosis not present

## 2021-08-17 DIAGNOSIS — D631 Anemia in chronic kidney disease: Secondary | ICD-10-CM | POA: Diagnosis not present

## 2021-08-17 DIAGNOSIS — N2581 Secondary hyperparathyroidism of renal origin: Secondary | ICD-10-CM | POA: Diagnosis not present

## 2021-08-17 DIAGNOSIS — Z4932 Encounter for adequacy testing for peritoneal dialysis: Secondary | ICD-10-CM | POA: Diagnosis not present

## 2021-08-17 DIAGNOSIS — N186 End stage renal disease: Secondary | ICD-10-CM | POA: Diagnosis not present

## 2021-08-17 DIAGNOSIS — Z992 Dependence on renal dialysis: Secondary | ICD-10-CM | POA: Diagnosis not present

## 2021-08-18 DIAGNOSIS — Z992 Dependence on renal dialysis: Secondary | ICD-10-CM | POA: Diagnosis not present

## 2021-08-18 DIAGNOSIS — N2581 Secondary hyperparathyroidism of renal origin: Secondary | ICD-10-CM | POA: Diagnosis not present

## 2021-08-18 DIAGNOSIS — N186 End stage renal disease: Secondary | ICD-10-CM | POA: Diagnosis not present

## 2021-08-18 DIAGNOSIS — D631 Anemia in chronic kidney disease: Secondary | ICD-10-CM | POA: Diagnosis not present

## 2021-08-18 DIAGNOSIS — Z4932 Encounter for adequacy testing for peritoneal dialysis: Secondary | ICD-10-CM | POA: Diagnosis not present

## 2021-08-19 DIAGNOSIS — N186 End stage renal disease: Secondary | ICD-10-CM | POA: Diagnosis not present

## 2021-08-19 DIAGNOSIS — N2581 Secondary hyperparathyroidism of renal origin: Secondary | ICD-10-CM | POA: Diagnosis not present

## 2021-08-19 DIAGNOSIS — D631 Anemia in chronic kidney disease: Secondary | ICD-10-CM | POA: Diagnosis not present

## 2021-08-19 DIAGNOSIS — Z4932 Encounter for adequacy testing for peritoneal dialysis: Secondary | ICD-10-CM | POA: Diagnosis not present

## 2021-08-19 DIAGNOSIS — Z992 Dependence on renal dialysis: Secondary | ICD-10-CM | POA: Diagnosis not present

## 2021-08-19 DIAGNOSIS — N185 Chronic kidney disease, stage 5: Secondary | ICD-10-CM | POA: Diagnosis not present

## 2021-08-20 DIAGNOSIS — N186 End stage renal disease: Secondary | ICD-10-CM | POA: Diagnosis not present

## 2021-08-20 DIAGNOSIS — Z992 Dependence on renal dialysis: Secondary | ICD-10-CM | POA: Diagnosis not present

## 2021-08-20 DIAGNOSIS — D631 Anemia in chronic kidney disease: Secondary | ICD-10-CM | POA: Diagnosis not present

## 2021-08-20 DIAGNOSIS — Z4932 Encounter for adequacy testing for peritoneal dialysis: Secondary | ICD-10-CM | POA: Diagnosis not present

## 2021-08-20 DIAGNOSIS — N2581 Secondary hyperparathyroidism of renal origin: Secondary | ICD-10-CM | POA: Diagnosis not present

## 2021-08-21 DIAGNOSIS — D631 Anemia in chronic kidney disease: Secondary | ICD-10-CM | POA: Diagnosis not present

## 2021-08-21 DIAGNOSIS — Z4932 Encounter for adequacy testing for peritoneal dialysis: Secondary | ICD-10-CM | POA: Diagnosis not present

## 2021-08-21 DIAGNOSIS — Z992 Dependence on renal dialysis: Secondary | ICD-10-CM | POA: Diagnosis not present

## 2021-08-21 DIAGNOSIS — N2581 Secondary hyperparathyroidism of renal origin: Secondary | ICD-10-CM | POA: Diagnosis not present

## 2021-08-21 DIAGNOSIS — N186 End stage renal disease: Secondary | ICD-10-CM | POA: Diagnosis not present

## 2021-08-22 DIAGNOSIS — Z4932 Encounter for adequacy testing for peritoneal dialysis: Secondary | ICD-10-CM | POA: Diagnosis not present

## 2021-08-22 DIAGNOSIS — D631 Anemia in chronic kidney disease: Secondary | ICD-10-CM | POA: Diagnosis not present

## 2021-08-22 DIAGNOSIS — Z992 Dependence on renal dialysis: Secondary | ICD-10-CM | POA: Diagnosis not present

## 2021-08-22 DIAGNOSIS — N186 End stage renal disease: Secondary | ICD-10-CM | POA: Diagnosis not present

## 2021-08-22 DIAGNOSIS — N2581 Secondary hyperparathyroidism of renal origin: Secondary | ICD-10-CM | POA: Diagnosis not present

## 2021-08-23 ENCOUNTER — Telehealth: Payer: Federal, State, Local not specified - PPO | Admitting: Cardiology

## 2021-08-23 DIAGNOSIS — Z4932 Encounter for adequacy testing for peritoneal dialysis: Secondary | ICD-10-CM | POA: Diagnosis not present

## 2021-08-23 DIAGNOSIS — Z992 Dependence on renal dialysis: Secondary | ICD-10-CM | POA: Diagnosis not present

## 2021-08-23 DIAGNOSIS — N2581 Secondary hyperparathyroidism of renal origin: Secondary | ICD-10-CM | POA: Diagnosis not present

## 2021-08-23 DIAGNOSIS — N186 End stage renal disease: Secondary | ICD-10-CM | POA: Diagnosis not present

## 2021-08-23 DIAGNOSIS — D631 Anemia in chronic kidney disease: Secondary | ICD-10-CM | POA: Diagnosis not present

## 2021-08-24 DIAGNOSIS — Z992 Dependence on renal dialysis: Secondary | ICD-10-CM | POA: Diagnosis not present

## 2021-08-24 DIAGNOSIS — D631 Anemia in chronic kidney disease: Secondary | ICD-10-CM | POA: Diagnosis not present

## 2021-08-24 DIAGNOSIS — N186 End stage renal disease: Secondary | ICD-10-CM | POA: Diagnosis not present

## 2021-08-24 DIAGNOSIS — Z4932 Encounter for adequacy testing for peritoneal dialysis: Secondary | ICD-10-CM | POA: Diagnosis not present

## 2021-08-24 DIAGNOSIS — N2581 Secondary hyperparathyroidism of renal origin: Secondary | ICD-10-CM | POA: Diagnosis not present

## 2021-08-25 DIAGNOSIS — N186 End stage renal disease: Secondary | ICD-10-CM | POA: Diagnosis not present

## 2021-08-25 DIAGNOSIS — Z4932 Encounter for adequacy testing for peritoneal dialysis: Secondary | ICD-10-CM | POA: Diagnosis not present

## 2021-08-25 DIAGNOSIS — D631 Anemia in chronic kidney disease: Secondary | ICD-10-CM | POA: Diagnosis not present

## 2021-08-25 DIAGNOSIS — N2581 Secondary hyperparathyroidism of renal origin: Secondary | ICD-10-CM | POA: Diagnosis not present

## 2021-08-25 DIAGNOSIS — Z992 Dependence on renal dialysis: Secondary | ICD-10-CM | POA: Diagnosis not present

## 2021-08-26 DIAGNOSIS — Z4932 Encounter for adequacy testing for peritoneal dialysis: Secondary | ICD-10-CM | POA: Diagnosis not present

## 2021-08-26 DIAGNOSIS — N186 End stage renal disease: Secondary | ICD-10-CM | POA: Diagnosis not present

## 2021-08-26 DIAGNOSIS — Z992 Dependence on renal dialysis: Secondary | ICD-10-CM | POA: Diagnosis not present

## 2021-08-26 DIAGNOSIS — N2581 Secondary hyperparathyroidism of renal origin: Secondary | ICD-10-CM | POA: Diagnosis not present

## 2021-08-26 DIAGNOSIS — D631 Anemia in chronic kidney disease: Secondary | ICD-10-CM | POA: Diagnosis not present

## 2021-08-27 DIAGNOSIS — Z4932 Encounter for adequacy testing for peritoneal dialysis: Secondary | ICD-10-CM | POA: Diagnosis not present

## 2021-08-27 DIAGNOSIS — Z992 Dependence on renal dialysis: Secondary | ICD-10-CM | POA: Diagnosis not present

## 2021-08-27 DIAGNOSIS — N2581 Secondary hyperparathyroidism of renal origin: Secondary | ICD-10-CM | POA: Diagnosis not present

## 2021-08-27 DIAGNOSIS — D631 Anemia in chronic kidney disease: Secondary | ICD-10-CM | POA: Diagnosis not present

## 2021-08-27 DIAGNOSIS — N186 End stage renal disease: Secondary | ICD-10-CM | POA: Diagnosis not present

## 2021-08-28 DIAGNOSIS — Z992 Dependence on renal dialysis: Secondary | ICD-10-CM | POA: Diagnosis not present

## 2021-08-28 DIAGNOSIS — D631 Anemia in chronic kidney disease: Secondary | ICD-10-CM | POA: Diagnosis not present

## 2021-08-28 DIAGNOSIS — N2581 Secondary hyperparathyroidism of renal origin: Secondary | ICD-10-CM | POA: Diagnosis not present

## 2021-08-28 DIAGNOSIS — N186 End stage renal disease: Secondary | ICD-10-CM | POA: Diagnosis not present

## 2021-08-28 DIAGNOSIS — Z4932 Encounter for adequacy testing for peritoneal dialysis: Secondary | ICD-10-CM | POA: Diagnosis not present

## 2021-08-29 DIAGNOSIS — Z4932 Encounter for adequacy testing for peritoneal dialysis: Secondary | ICD-10-CM | POA: Diagnosis not present

## 2021-08-29 DIAGNOSIS — D631 Anemia in chronic kidney disease: Secondary | ICD-10-CM | POA: Diagnosis not present

## 2021-08-29 DIAGNOSIS — N2581 Secondary hyperparathyroidism of renal origin: Secondary | ICD-10-CM | POA: Diagnosis not present

## 2021-08-29 DIAGNOSIS — Z992 Dependence on renal dialysis: Secondary | ICD-10-CM | POA: Diagnosis not present

## 2021-08-29 DIAGNOSIS — N186 End stage renal disease: Secondary | ICD-10-CM | POA: Diagnosis not present

## 2021-08-30 ENCOUNTER — Telehealth: Payer: Self-pay | Admitting: Physician Assistant

## 2021-08-30 DIAGNOSIS — N186 End stage renal disease: Secondary | ICD-10-CM | POA: Diagnosis not present

## 2021-08-30 DIAGNOSIS — Z4932 Encounter for adequacy testing for peritoneal dialysis: Secondary | ICD-10-CM | POA: Diagnosis not present

## 2021-08-30 DIAGNOSIS — Z992 Dependence on renal dialysis: Secondary | ICD-10-CM | POA: Diagnosis not present

## 2021-08-30 DIAGNOSIS — D631 Anemia in chronic kidney disease: Secondary | ICD-10-CM | POA: Diagnosis not present

## 2021-08-30 DIAGNOSIS — N2581 Secondary hyperparathyroidism of renal origin: Secondary | ICD-10-CM | POA: Diagnosis not present

## 2021-08-30 NOTE — Telephone Encounter (Signed)
Patient returned my call. ? ?He stated he has not decided if he is going back to [facility] or change pcp ? ?He stated he would call back ? ?

## 2021-08-30 NOTE — Telephone Encounter (Signed)
Left message for patient to call back and schedule Medicare Annual Wellness Visit (AWV) either virtually or in office. I left my number for patient to call [phone].   awvi 07/15/19 per [facility]   please schedule at anytime with health coach  This should be a 45 minute visit.   

## 2021-08-31 DIAGNOSIS — D631 Anemia in chronic kidney disease: Secondary | ICD-10-CM | POA: Diagnosis not present

## 2021-08-31 DIAGNOSIS — N186 End stage renal disease: Secondary | ICD-10-CM | POA: Diagnosis not present

## 2021-08-31 DIAGNOSIS — Z992 Dependence on renal dialysis: Secondary | ICD-10-CM | POA: Diagnosis not present

## 2021-08-31 DIAGNOSIS — Z4932 Encounter for adequacy testing for peritoneal dialysis: Secondary | ICD-10-CM | POA: Diagnosis not present

## 2021-08-31 DIAGNOSIS — N2581 Secondary hyperparathyroidism of renal origin: Secondary | ICD-10-CM | POA: Diagnosis not present

## 2021-09-01 DIAGNOSIS — Z4932 Encounter for adequacy testing for peritoneal dialysis: Secondary | ICD-10-CM | POA: Diagnosis not present

## 2021-09-01 DIAGNOSIS — D631 Anemia in chronic kidney disease: Secondary | ICD-10-CM | POA: Diagnosis not present

## 2021-09-01 DIAGNOSIS — N186 End stage renal disease: Secondary | ICD-10-CM | POA: Diagnosis not present

## 2021-09-01 DIAGNOSIS — Z992 Dependence on renal dialysis: Secondary | ICD-10-CM | POA: Diagnosis not present

## 2021-09-01 DIAGNOSIS — N2581 Secondary hyperparathyroidism of renal origin: Secondary | ICD-10-CM | POA: Diagnosis not present

## 2021-09-02 DIAGNOSIS — Z4932 Encounter for adequacy testing for peritoneal dialysis: Secondary | ICD-10-CM | POA: Diagnosis not present

## 2021-09-02 DIAGNOSIS — N2581 Secondary hyperparathyroidism of renal origin: Secondary | ICD-10-CM | POA: Diagnosis not present

## 2021-09-02 DIAGNOSIS — D631 Anemia in chronic kidney disease: Secondary | ICD-10-CM | POA: Diagnosis not present

## 2021-09-02 DIAGNOSIS — Z992 Dependence on renal dialysis: Secondary | ICD-10-CM | POA: Diagnosis not present

## 2021-09-02 DIAGNOSIS — N186 End stage renal disease: Secondary | ICD-10-CM | POA: Diagnosis not present

## 2021-09-03 DIAGNOSIS — N2581 Secondary hyperparathyroidism of renal origin: Secondary | ICD-10-CM | POA: Diagnosis not present

## 2021-09-03 DIAGNOSIS — Z4932 Encounter for adequacy testing for peritoneal dialysis: Secondary | ICD-10-CM | POA: Diagnosis not present

## 2021-09-03 DIAGNOSIS — N186 End stage renal disease: Secondary | ICD-10-CM | POA: Diagnosis not present

## 2021-09-03 DIAGNOSIS — Z992 Dependence on renal dialysis: Secondary | ICD-10-CM | POA: Diagnosis not present

## 2021-09-03 DIAGNOSIS — D631 Anemia in chronic kidney disease: Secondary | ICD-10-CM | POA: Diagnosis not present

## 2021-09-03 DIAGNOSIS — H524 Presbyopia: Secondary | ICD-10-CM | POA: Diagnosis not present

## 2021-09-04 DIAGNOSIS — Z992 Dependence on renal dialysis: Secondary | ICD-10-CM | POA: Diagnosis not present

## 2021-09-04 DIAGNOSIS — D631 Anemia in chronic kidney disease: Secondary | ICD-10-CM | POA: Diagnosis not present

## 2021-09-04 DIAGNOSIS — N186 End stage renal disease: Secondary | ICD-10-CM | POA: Diagnosis not present

## 2021-09-04 DIAGNOSIS — Z4932 Encounter for adequacy testing for peritoneal dialysis: Secondary | ICD-10-CM | POA: Diagnosis not present

## 2021-09-04 DIAGNOSIS — N2581 Secondary hyperparathyroidism of renal origin: Secondary | ICD-10-CM | POA: Diagnosis not present

## 2021-09-05 DIAGNOSIS — Z4932 Encounter for adequacy testing for peritoneal dialysis: Secondary | ICD-10-CM | POA: Diagnosis not present

## 2021-09-05 DIAGNOSIS — N186 End stage renal disease: Secondary | ICD-10-CM | POA: Diagnosis not present

## 2021-09-05 DIAGNOSIS — D631 Anemia in chronic kidney disease: Secondary | ICD-10-CM | POA: Diagnosis not present

## 2021-09-05 DIAGNOSIS — N2581 Secondary hyperparathyroidism of renal origin: Secondary | ICD-10-CM | POA: Diagnosis not present

## 2021-09-05 DIAGNOSIS — Z992 Dependence on renal dialysis: Secondary | ICD-10-CM | POA: Diagnosis not present

## 2021-09-06 DIAGNOSIS — Z4932 Encounter for adequacy testing for peritoneal dialysis: Secondary | ICD-10-CM | POA: Diagnosis not present

## 2021-09-06 DIAGNOSIS — N2581 Secondary hyperparathyroidism of renal origin: Secondary | ICD-10-CM | POA: Diagnosis not present

## 2021-09-06 DIAGNOSIS — N186 End stage renal disease: Secondary | ICD-10-CM | POA: Diagnosis not present

## 2021-09-06 DIAGNOSIS — D631 Anemia in chronic kidney disease: Secondary | ICD-10-CM | POA: Diagnosis not present

## 2021-09-06 DIAGNOSIS — Z992 Dependence on renal dialysis: Secondary | ICD-10-CM | POA: Diagnosis not present

## 2021-09-07 DIAGNOSIS — N186 End stage renal disease: Secondary | ICD-10-CM | POA: Diagnosis not present

## 2021-09-07 DIAGNOSIS — N2581 Secondary hyperparathyroidism of renal origin: Secondary | ICD-10-CM | POA: Diagnosis not present

## 2021-09-07 DIAGNOSIS — D631 Anemia in chronic kidney disease: Secondary | ICD-10-CM | POA: Diagnosis not present

## 2021-09-07 DIAGNOSIS — Z992 Dependence on renal dialysis: Secondary | ICD-10-CM | POA: Diagnosis not present

## 2021-09-07 DIAGNOSIS — Z4932 Encounter for adequacy testing for peritoneal dialysis: Secondary | ICD-10-CM | POA: Diagnosis not present

## 2021-09-08 DIAGNOSIS — Z992 Dependence on renal dialysis: Secondary | ICD-10-CM | POA: Diagnosis not present

## 2021-09-08 DIAGNOSIS — N2581 Secondary hyperparathyroidism of renal origin: Secondary | ICD-10-CM | POA: Diagnosis not present

## 2021-09-08 DIAGNOSIS — Z4932 Encounter for adequacy testing for peritoneal dialysis: Secondary | ICD-10-CM | POA: Diagnosis not present

## 2021-09-08 DIAGNOSIS — D631 Anemia in chronic kidney disease: Secondary | ICD-10-CM | POA: Diagnosis not present

## 2021-09-08 DIAGNOSIS — N186 End stage renal disease: Secondary | ICD-10-CM | POA: Diagnosis not present

## 2021-09-09 DIAGNOSIS — N2581 Secondary hyperparathyroidism of renal origin: Secondary | ICD-10-CM | POA: Diagnosis not present

## 2021-09-09 DIAGNOSIS — Z992 Dependence on renal dialysis: Secondary | ICD-10-CM | POA: Diagnosis not present

## 2021-09-09 DIAGNOSIS — N186 End stage renal disease: Secondary | ICD-10-CM | POA: Diagnosis not present

## 2021-09-09 DIAGNOSIS — Z4932 Encounter for adequacy testing for peritoneal dialysis: Secondary | ICD-10-CM | POA: Diagnosis not present

## 2021-09-09 DIAGNOSIS — D631 Anemia in chronic kidney disease: Secondary | ICD-10-CM | POA: Diagnosis not present

## 2021-09-10 DIAGNOSIS — Z992 Dependence on renal dialysis: Secondary | ICD-10-CM | POA: Diagnosis not present

## 2021-09-10 DIAGNOSIS — N186 End stage renal disease: Secondary | ICD-10-CM | POA: Diagnosis not present

## 2021-09-10 DIAGNOSIS — Z4932 Encounter for adequacy testing for peritoneal dialysis: Secondary | ICD-10-CM | POA: Diagnosis not present

## 2021-09-10 DIAGNOSIS — D631 Anemia in chronic kidney disease: Secondary | ICD-10-CM | POA: Diagnosis not present

## 2021-09-10 DIAGNOSIS — N2581 Secondary hyperparathyroidism of renal origin: Secondary | ICD-10-CM | POA: Diagnosis not present

## 2021-09-11 DIAGNOSIS — D631 Anemia in chronic kidney disease: Secondary | ICD-10-CM | POA: Diagnosis not present

## 2021-09-11 DIAGNOSIS — N186 End stage renal disease: Secondary | ICD-10-CM | POA: Diagnosis not present

## 2021-09-11 DIAGNOSIS — Z992 Dependence on renal dialysis: Secondary | ICD-10-CM | POA: Diagnosis not present

## 2021-09-11 DIAGNOSIS — Z4932 Encounter for adequacy testing for peritoneal dialysis: Secondary | ICD-10-CM | POA: Diagnosis not present

## 2021-09-11 DIAGNOSIS — N2581 Secondary hyperparathyroidism of renal origin: Secondary | ICD-10-CM | POA: Diagnosis not present

## 2021-09-12 DIAGNOSIS — N2581 Secondary hyperparathyroidism of renal origin: Secondary | ICD-10-CM | POA: Diagnosis not present

## 2021-09-12 DIAGNOSIS — D631 Anemia in chronic kidney disease: Secondary | ICD-10-CM | POA: Diagnosis not present

## 2021-09-12 DIAGNOSIS — N186 End stage renal disease: Secondary | ICD-10-CM | POA: Diagnosis not present

## 2021-09-12 DIAGNOSIS — Z4932 Encounter for adequacy testing for peritoneal dialysis: Secondary | ICD-10-CM | POA: Diagnosis not present

## 2021-09-12 DIAGNOSIS — Z992 Dependence on renal dialysis: Secondary | ICD-10-CM | POA: Diagnosis not present

## 2021-09-13 ENCOUNTER — Encounter: Payer: Self-pay | Admitting: Cardiology

## 2021-09-13 DIAGNOSIS — Z992 Dependence on renal dialysis: Secondary | ICD-10-CM | POA: Diagnosis not present

## 2021-09-13 DIAGNOSIS — N186 End stage renal disease: Secondary | ICD-10-CM | POA: Diagnosis not present

## 2021-09-13 DIAGNOSIS — D631 Anemia in chronic kidney disease: Secondary | ICD-10-CM | POA: Diagnosis not present

## 2021-09-13 DIAGNOSIS — N2581 Secondary hyperparathyroidism of renal origin: Secondary | ICD-10-CM | POA: Diagnosis not present

## 2021-09-14 DIAGNOSIS — D631 Anemia in chronic kidney disease: Secondary | ICD-10-CM | POA: Diagnosis not present

## 2021-09-14 DIAGNOSIS — Z992 Dependence on renal dialysis: Secondary | ICD-10-CM | POA: Diagnosis not present

## 2021-09-14 DIAGNOSIS — N2581 Secondary hyperparathyroidism of renal origin: Secondary | ICD-10-CM | POA: Diagnosis not present

## 2021-09-14 DIAGNOSIS — N186 End stage renal disease: Secondary | ICD-10-CM | POA: Diagnosis not present

## 2021-09-15 DIAGNOSIS — N186 End stage renal disease: Secondary | ICD-10-CM | POA: Diagnosis not present

## 2021-09-15 DIAGNOSIS — Z992 Dependence on renal dialysis: Secondary | ICD-10-CM | POA: Diagnosis not present

## 2021-09-15 DIAGNOSIS — D631 Anemia in chronic kidney disease: Secondary | ICD-10-CM | POA: Diagnosis not present

## 2021-09-15 DIAGNOSIS — N2581 Secondary hyperparathyroidism of renal origin: Secondary | ICD-10-CM | POA: Diagnosis not present

## 2021-09-15 NOTE — Progress Notes (Signed)
? ?  ? ?Virtual Visit via Video Note  ? ?This visit type was conducted due to national recommendations for restrictions regarding the COVID-19 Pandemic (e.g. social distancing) in an effort to limit this patient's exposure and mitigate transmission in our community.  Due to his co-morbid illnesses, this patient is at least at moderate risk for complications without adequate follow up.  This format is felt to be most appropriate for this patient at this time.  All issues noted in this document were discussed and addressed.  A limited physical exam was performed with this format.  Please refer to the patient's chart for his consent to telehealth for [facility]. ? ?  ?Date:  09/15/2021  ? ?ID:  Adrian Frye, DOB June 20, 1970, MRN [id number] ?The patient was identified using 2 identifiers. ? ?Patient Location: Home ?Provider Location: Office/Clinic ? ?Cardiology Office Note:   ? ?Date:  09/16/2021  ? ?ID:  Adrian Frye, DOB 1970-12-11, MRN [id number] ? ?PCP:  Irene Pap, PA-C  ?Cardiologist:  None   ? ?Referring MD: Girtha Rm, NP-C  ? ?Chief Complaint  ?Patient presents with  ? Sleep Apnea  ? Hypertension  ? ? ?History of Present Illness:   ? ?Adrian Frye is a 51 y.o. male with a hx of  DCM, HTN, OSA on CPAP and obesity.  He is doing well with his CPAP device and thinks that he has gotten used to it.  He tolerates the mask and feels the pressure is adequate.  Since going on CPAP he feels rested in the am and has no significant daytime sleepiness.  He denies any significant mouth or nasal dryness or nasal congestion.  He does not think that he snores.    ? ?Past Medical History:  ?Diagnosis Date  ? Allergic rhinitis   ? Allergy   ? Anemia   ? Asthma   ? as a child  ? Bacteremia   ? Benign essential hypertension   ? Dilated idiopathic cardiomyopathy ([place])   ? now resoved with EF 55% by echo 2011  ? Edema   ? Encounter for blood transfusion   ? Endocarditis   ? ESRD (end stage renal  disease) ([place])   ? ESRD on peritoneal dialysis [facility])   ? Heart disease   ? Hyperlipidemia   ? Hyperparathyroidism, secondary ([place])   ? Hypertension   ? Morbid obesity ([place])   ? Sleep apnea   ? c-pap  ? UGI bleed 2011  ? ASA  ? ? ?Past Surgical History:  ?Procedure Laterality Date  ? ACHILLES TENDON REPAIR    ? ruptured; right  ? AV FISTULA PLACEMENT Left 11/03/2017  ? Procedure: ARTERIOVENOUS (AV) FISTULA CREATION  left upper arm;  Surgeon: Rosetta Posner, MD;  Location: [place];  Service: Vascular;  Laterality: Left;  ? DIALYSIS FISTULA CREATION    ? INSERTION OF DIALYSIS CATHETER N/A 11/03/2017  ? Procedure: INSERTION OF DIALYSIS CATHETER - RIGHT INTERNAL JUGULAR PLACEMENT;  Surgeon: Rosetta Posner, MD;  Location: [place];  Service: Vascular;  Laterality: N/A;  ? IR FLUORO GUIDE CV LINE LEFT  01/24/2020  ? IR REMOVAL TUN CV CATH W/O FL  01/21/2020  ? IR REMOVAL TUN CV CATH W/O FL  03/06/2020  ? IR US GUIDE VASC ACCESS LEFT  01/24/2020  ? KIDNEY FAILURE    ? LAPAROSCOPIC GASTROTOMY W/ REPAIR OF ULCER    ? PLEURAL SCARIFICATION    ? left, football trauma-chest tube  ?  STOMACH SURGERY    ? TEE WITHOUT CARDIOVERSION N/A 01/22/2020  ? Procedure: TRANSESOPHAGEAL ECHOCARDIOGRAM (TEE);  Surgeon: Pixie Casino, MD;  Location: [facility] ENDOSCOPY;  Service: Cardiovascular;  Laterality: N/A;  ? TONSILLECTOMY    ? TONSILLECTOMY    ? UPPER GASTROINTESTINAL ENDOSCOPY    ? ? ?Current Medications: ?Current Meds  ?Medication Sig  ? amLODipine (NORVASC) 5 MG tablet TAKE 1.5 TABLETS BY MOUTH DAILY.  ? B Complex-C-Folic Acid (DIALYVITE 211 PO) Take 1 tablet by mouth daily.  ? calcitRIOL (ROCALTROL) 0.5 MCG capsule Take 0.5 mcg by mouth daily.  ? gentamicin cream (GARAMYCIN) 0.1 % Apply 1 application topically as directed. Port site  ? Methoxy PEG-Epoetin Beta (MIRCERA IJ) Inject into the skin.  ? Semaglutide,0.25 or 0.'5MG'$ /DOS, (OZEMPIC, 0.25 OR 0.5 MG/DOSE,) 2 MG/3ML SOPN   ? sildenafil (REVATIO) 20 MG tablet Take 20 mg by mouth 3 (three) times daily.   ? zolpidem (AMBIEN CR) 12.5 MG CR tablet Take 1 tablet (12.5 mg total) by mouth at bedtime as needed for sleep. Please keep upcoming appt in April 2023 with Dr. Radford Pax before anymore refills. Thank you  ?  ? ?Allergies:   Lisinopril and Losartan  ? ?Social History  ? ?Socioeconomic History  ? Marital status: Married  ?  Spouse name: Not on file  ? Number of children: 2  ? Years of education: Not on file  ? Highest education level: Not on file  ?Occupational History  ? Occupation: Clinical cytogeneticist  ?Tobacco Use  ? Smoking status: Former  ?  Years: 6.00  ?  Types: Cigarettes  ?  Quit date: 05/16/2009  ?  Years since quitting: 12.3  ? Smokeless tobacco: Never  ? Tobacco comments:  ?  smoked 1 pack per week   ?Vaping Use  ? Vaping Use: Never used  ?Substance and Sexual Activity  ? Alcohol use: Not Currently  ?  Alcohol/week: 2.0 standard drinks  ?  Types: 2 Cans of beer per week  ?  Comment: occasionally  ? Drug use: No  ? Sexual activity: Yes  ?Other Topics Concern  ? Not on file  ?Social History Narrative  ? Not on file  ? ?Social Determinants of Health  ? ?Financial Resource Strain: Not on file  ?Food Insecurity: Not on file  ?Transportation Needs: Not on file  ?Physical Activity: Not on file  ?Stress: Not on file  ?Social Connections: Not on file  ?  ? ?Family History: ?The patient's family history includes Diabetes in his mother; Heart disease in his father; Heart failure in his mother; Hypertension in his brother, brother, and mother. There is no history of Colon cancer, Esophageal cancer, Rectal cancer, or Stomach cancer. ? ?ROS:   ?Please see the history of present illness.    ?ROS  ?All other systems reviewed and negative.  ? ?EKGs/Labs/Other Studies Reviewed:   ? ?The following studies were reviewed today: ?PAP compliance download ? ?EKG:  EKG is not ordered today.  ? ?Recent Labs: ?No results found for requested labs within last 8760 hours.  ? ?Recent Lipid Panel ?No results found for: CHOL, TRIG, HDL,  CHOLHDL, VLDL, LDLCALC, LDLDIRECT ? ?CHA2DS2-VASc Score =   '[ ]'$ .  Therefore, the patient's annual risk of stroke is   %.    ?  ? ? ?Physical Exam:   ? ?VS:  BP 120/76   Pulse 90   Ht '5\' 7"'$  (1.702 m)   Wt 279 lb 14.4 oz (127 kg)  BMI 43.84 kg/m?    ? ?Wt Readings from Last 3 Encounters:  ?09/16/21 279 lb 14.4 oz (127 kg)  ?10/21/20 300 lb (136.1 kg)  ?08/19/20 (!) 302 lb (137 kg)  ?  ? ?GEN:  Well nourished, well developed in no acute distress ?HEENT: Normal ?NECK: No JVD; No carotid bruits ?LYMPHATICS: No lymphadenopathy ?CARDIAC: RRR, no murmurs, rubs, gallops ?RESPIRATORY:  Clear to auscultation without rales, wheezing or rhonchi  ?ABDOMEN: Soft, non-tender, non-distended ?MUSCULOSKELETAL:  No edema; No deformity  ?SKIN: Warm and dry ?NEUROLOGIC:  Alert and oriented x 3 ?PSYCHIATRIC:  Normal affect  ? ?ASSESSMENT:   ? ?1. OSA on CPAP   ?2. Primary hypertension   ? ?PLAN:   ? ?In order of problems listed above: ? ?OSA - The patient is tolerating PAP therapy well without any problems. The PAP download performed by his DME was personally reviewed and interpreted by me today and showed an AHI of 1.2/hr on 20 cm H2O with 100% compliance in using more than 4 hours nightly.  The patient has been using and benefiting from PAP use and will continue to benefit from therapy.  ? ?HTN ?-BP controlled on exam ?-continue prescription drug management with Amlodipine '10mg'$  daily with PRN refills ? ?Insomnia ?-He takes Zolpidem for sleep maintenance ?-he needs a refill for the long acting form  ? ? ?Time Spent: ?10 minutes total time of encounter, including 15 minutes spent in face-to-face patient care on the date of this encounter.  ? ?Medication Adjustments/Labs and Tests Ordered: ?Current medicines are reviewed at length with the patient today.  Concerns regarding medicines are outlined above.  ?No orders of the defined types were placed in this encounter. ? ?No orders of the defined types were placed in this  encounter. ? ? ?Signed, ?[name], MD  ?09/16/2021 8:52 AM    ?[place] ? ?

## 2021-09-16 ENCOUNTER — Telehealth (INDEPENDENT_AMBULATORY_CARE_PROVIDER_SITE_OTHER): Payer: Medicare Other | Admitting: Cardiology

## 2021-09-16 ENCOUNTER — Encounter: Payer: Self-pay | Admitting: Cardiology

## 2021-09-16 VITALS — BP 120/76 | HR 90 | Ht 67.0 in | Wt 279.9 lb

## 2021-09-16 DIAGNOSIS — Z9989 Dependence on other enabling machines and devices: Secondary | ICD-10-CM | POA: Diagnosis not present

## 2021-09-16 DIAGNOSIS — N2581 Secondary hyperparathyroidism of renal origin: Secondary | ICD-10-CM | POA: Diagnosis not present

## 2021-09-16 DIAGNOSIS — I10 Essential (primary) hypertension: Secondary | ICD-10-CM

## 2021-09-16 DIAGNOSIS — N186 End stage renal disease: Secondary | ICD-10-CM | POA: Diagnosis not present

## 2021-09-16 DIAGNOSIS — G4733 Obstructive sleep apnea (adult) (pediatric): Secondary | ICD-10-CM | POA: Diagnosis not present

## 2021-09-16 DIAGNOSIS — Z992 Dependence on renal dialysis: Secondary | ICD-10-CM | POA: Diagnosis not present

## 2021-09-16 DIAGNOSIS — D631 Anemia in chronic kidney disease: Secondary | ICD-10-CM | POA: Diagnosis not present

## 2021-09-16 MED ORDER — ZOLPIDEM TARTRATE ER 12.5 MG PO TBCR: 12.5000 mg | EXTENDED_RELEASE_TABLET | Freq: Every evening | ORAL | 3 refills | Status: DC | PRN

## 2021-09-16 NOTE — Patient Instructions (Signed)

## 2021-09-16 NOTE — Addendum Note (Signed)
Addended by: Antonieta Iba on: 09/16/2021 09:21 AM ? ? Modules accepted: Orders ? ?

## 2021-09-17 ENCOUNTER — Encounter: Payer: Self-pay | Admitting: Cardiology

## 2021-09-17 DIAGNOSIS — D631 Anemia in chronic kidney disease: Secondary | ICD-10-CM | POA: Diagnosis not present

## 2021-09-17 DIAGNOSIS — N186 End stage renal disease: Secondary | ICD-10-CM | POA: Diagnosis not present

## 2021-09-17 DIAGNOSIS — Z992 Dependence on renal dialysis: Secondary | ICD-10-CM | POA: Diagnosis not present

## 2021-09-17 DIAGNOSIS — N2581 Secondary hyperparathyroidism of renal origin: Secondary | ICD-10-CM | POA: Diagnosis not present

## 2021-09-18 DIAGNOSIS — N2581 Secondary hyperparathyroidism of renal origin: Secondary | ICD-10-CM | POA: Diagnosis not present

## 2021-09-18 DIAGNOSIS — N186 End stage renal disease: Secondary | ICD-10-CM | POA: Diagnosis not present

## 2021-09-18 DIAGNOSIS — Z992 Dependence on renal dialysis: Secondary | ICD-10-CM | POA: Diagnosis not present

## 2021-09-18 DIAGNOSIS — D631 Anemia in chronic kidney disease: Secondary | ICD-10-CM | POA: Diagnosis not present

## 2021-09-19 DIAGNOSIS — N186 End stage renal disease: Secondary | ICD-10-CM | POA: Diagnosis not present

## 2021-09-19 DIAGNOSIS — N2581 Secondary hyperparathyroidism of renal origin: Secondary | ICD-10-CM | POA: Diagnosis not present

## 2021-09-19 DIAGNOSIS — Z992 Dependence on renal dialysis: Secondary | ICD-10-CM | POA: Diagnosis not present

## 2021-09-19 DIAGNOSIS — D631 Anemia in chronic kidney disease: Secondary | ICD-10-CM | POA: Diagnosis not present

## 2021-09-20 DIAGNOSIS — D631 Anemia in chronic kidney disease: Secondary | ICD-10-CM | POA: Diagnosis not present

## 2021-09-20 DIAGNOSIS — N186 End stage renal disease: Secondary | ICD-10-CM | POA: Diagnosis not present

## 2021-09-20 DIAGNOSIS — Z992 Dependence on renal dialysis: Secondary | ICD-10-CM | POA: Diagnosis not present

## 2021-09-20 DIAGNOSIS — N2581 Secondary hyperparathyroidism of renal origin: Secondary | ICD-10-CM | POA: Diagnosis not present

## 2021-09-21 DIAGNOSIS — D631 Anemia in chronic kidney disease: Secondary | ICD-10-CM | POA: Diagnosis not present

## 2021-09-21 DIAGNOSIS — N2581 Secondary hyperparathyroidism of renal origin: Secondary | ICD-10-CM | POA: Diagnosis not present

## 2021-09-21 DIAGNOSIS — N186 End stage renal disease: Secondary | ICD-10-CM | POA: Diagnosis not present

## 2021-09-21 DIAGNOSIS — Z992 Dependence on renal dialysis: Secondary | ICD-10-CM | POA: Diagnosis not present

## 2021-09-22 DIAGNOSIS — N186 End stage renal disease: Secondary | ICD-10-CM | POA: Diagnosis not present

## 2021-09-22 DIAGNOSIS — Z992 Dependence on renal dialysis: Secondary | ICD-10-CM | POA: Diagnosis not present

## 2021-09-22 DIAGNOSIS — D631 Anemia in chronic kidney disease: Secondary | ICD-10-CM | POA: Diagnosis not present

## 2021-09-22 DIAGNOSIS — N2581 Secondary hyperparathyroidism of renal origin: Secondary | ICD-10-CM | POA: Diagnosis not present

## 2021-09-23 DIAGNOSIS — D631 Anemia in chronic kidney disease: Secondary | ICD-10-CM | POA: Diagnosis not present

## 2021-09-23 DIAGNOSIS — N186 End stage renal disease: Secondary | ICD-10-CM | POA: Diagnosis not present

## 2021-09-23 DIAGNOSIS — Z992 Dependence on renal dialysis: Secondary | ICD-10-CM | POA: Diagnosis not present

## 2021-09-23 DIAGNOSIS — N2581 Secondary hyperparathyroidism of renal origin: Secondary | ICD-10-CM | POA: Diagnosis not present

## 2021-09-24 DIAGNOSIS — Z992 Dependence on renal dialysis: Secondary | ICD-10-CM | POA: Diagnosis not present

## 2021-09-24 DIAGNOSIS — D631 Anemia in chronic kidney disease: Secondary | ICD-10-CM | POA: Diagnosis not present

## 2021-09-24 DIAGNOSIS — N2581 Secondary hyperparathyroidism of renal origin: Secondary | ICD-10-CM | POA: Diagnosis not present

## 2021-09-24 DIAGNOSIS — N186 End stage renal disease: Secondary | ICD-10-CM | POA: Diagnosis not present

## 2021-09-25 DIAGNOSIS — D631 Anemia in chronic kidney disease: Secondary | ICD-10-CM | POA: Diagnosis not present

## 2021-09-25 DIAGNOSIS — Z992 Dependence on renal dialysis: Secondary | ICD-10-CM | POA: Diagnosis not present

## 2021-09-25 DIAGNOSIS — N2581 Secondary hyperparathyroidism of renal origin: Secondary | ICD-10-CM | POA: Diagnosis not present

## 2021-09-25 DIAGNOSIS — N186 End stage renal disease: Secondary | ICD-10-CM | POA: Diagnosis not present

## 2021-09-26 DIAGNOSIS — N186 End stage renal disease: Secondary | ICD-10-CM | POA: Diagnosis not present

## 2021-09-26 DIAGNOSIS — N2581 Secondary hyperparathyroidism of renal origin: Secondary | ICD-10-CM | POA: Diagnosis not present

## 2021-09-26 DIAGNOSIS — Z992 Dependence on renal dialysis: Secondary | ICD-10-CM | POA: Diagnosis not present

## 2021-09-26 DIAGNOSIS — D631 Anemia in chronic kidney disease: Secondary | ICD-10-CM | POA: Diagnosis not present

## 2021-09-27 DIAGNOSIS — Z992 Dependence on renal dialysis: Secondary | ICD-10-CM | POA: Diagnosis not present

## 2021-09-27 DIAGNOSIS — D631 Anemia in chronic kidney disease: Secondary | ICD-10-CM | POA: Diagnosis not present

## 2021-09-27 DIAGNOSIS — N2581 Secondary hyperparathyroidism of renal origin: Secondary | ICD-10-CM | POA: Diagnosis not present

## 2021-09-27 DIAGNOSIS — N186 End stage renal disease: Secondary | ICD-10-CM | POA: Diagnosis not present

## 2021-09-28 DIAGNOSIS — N2581 Secondary hyperparathyroidism of renal origin: Secondary | ICD-10-CM | POA: Diagnosis not present

## 2021-09-28 DIAGNOSIS — H43823 Vitreomacular adhesion, bilateral: Secondary | ICD-10-CM | POA: Diagnosis not present

## 2021-09-28 DIAGNOSIS — H35033 Hypertensive retinopathy, bilateral: Secondary | ICD-10-CM | POA: Diagnosis not present

## 2021-09-28 DIAGNOSIS — H348332 Tributary (branch) retinal vein occlusion, bilateral, stable: Secondary | ICD-10-CM | POA: Diagnosis not present

## 2021-09-28 DIAGNOSIS — N186 End stage renal disease: Secondary | ICD-10-CM | POA: Diagnosis not present

## 2021-09-28 DIAGNOSIS — Z992 Dependence on renal dialysis: Secondary | ICD-10-CM | POA: Diagnosis not present

## 2021-09-28 DIAGNOSIS — D631 Anemia in chronic kidney disease: Secondary | ICD-10-CM | POA: Diagnosis not present

## 2021-09-28 DIAGNOSIS — H31092 Other chorioretinal scars, left eye: Secondary | ICD-10-CM | POA: Diagnosis not present

## 2021-09-29 DIAGNOSIS — N186 End stage renal disease: Secondary | ICD-10-CM | POA: Diagnosis not present

## 2021-09-29 DIAGNOSIS — D631 Anemia in chronic kidney disease: Secondary | ICD-10-CM | POA: Diagnosis not present

## 2021-09-29 DIAGNOSIS — Z992 Dependence on renal dialysis: Secondary | ICD-10-CM | POA: Diagnosis not present

## 2021-09-29 DIAGNOSIS — N2581 Secondary hyperparathyroidism of renal origin: Secondary | ICD-10-CM | POA: Diagnosis not present

## 2021-09-30 DIAGNOSIS — Z992 Dependence on renal dialysis: Secondary | ICD-10-CM | POA: Diagnosis not present

## 2021-09-30 DIAGNOSIS — N186 End stage renal disease: Secondary | ICD-10-CM | POA: Diagnosis not present

## 2021-09-30 DIAGNOSIS — N2581 Secondary hyperparathyroidism of renal origin: Secondary | ICD-10-CM | POA: Diagnosis not present

## 2021-09-30 DIAGNOSIS — D631 Anemia in chronic kidney disease: Secondary | ICD-10-CM | POA: Diagnosis not present

## 2021-10-01 ENCOUNTER — Other Ambulatory Visit: Payer: Self-pay | Admitting: Cardiology

## 2021-10-01 ENCOUNTER — Encounter: Payer: Self-pay | Admitting: Cardiology

## 2021-10-01 DIAGNOSIS — Z992 Dependence on renal dialysis: Secondary | ICD-10-CM | POA: Diagnosis not present

## 2021-10-01 DIAGNOSIS — D631 Anemia in chronic kidney disease: Secondary | ICD-10-CM | POA: Diagnosis not present

## 2021-10-01 DIAGNOSIS — N2581 Secondary hyperparathyroidism of renal origin: Secondary | ICD-10-CM | POA: Diagnosis not present

## 2021-10-01 DIAGNOSIS — N186 End stage renal disease: Secondary | ICD-10-CM | POA: Diagnosis not present

## 2021-10-02 DIAGNOSIS — N2581 Secondary hyperparathyroidism of renal origin: Secondary | ICD-10-CM | POA: Diagnosis not present

## 2021-10-02 DIAGNOSIS — N186 End stage renal disease: Secondary | ICD-10-CM | POA: Diagnosis not present

## 2021-10-02 DIAGNOSIS — Z992 Dependence on renal dialysis: Secondary | ICD-10-CM | POA: Diagnosis not present

## 2021-10-02 DIAGNOSIS — D631 Anemia in chronic kidney disease: Secondary | ICD-10-CM | POA: Diagnosis not present

## 2021-10-03 DIAGNOSIS — N2581 Secondary hyperparathyroidism of renal origin: Secondary | ICD-10-CM | POA: Diagnosis not present

## 2021-10-03 DIAGNOSIS — N186 End stage renal disease: Secondary | ICD-10-CM | POA: Diagnosis not present

## 2021-10-03 DIAGNOSIS — Z992 Dependence on renal dialysis: Secondary | ICD-10-CM | POA: Diagnosis not present

## 2021-10-03 DIAGNOSIS — D631 Anemia in chronic kidney disease: Secondary | ICD-10-CM | POA: Diagnosis not present

## 2021-10-04 DIAGNOSIS — Z992 Dependence on renal dialysis: Secondary | ICD-10-CM | POA: Diagnosis not present

## 2021-10-04 DIAGNOSIS — D631 Anemia in chronic kidney disease: Secondary | ICD-10-CM | POA: Diagnosis not present

## 2021-10-04 DIAGNOSIS — N186 End stage renal disease: Secondary | ICD-10-CM | POA: Diagnosis not present

## 2021-10-04 DIAGNOSIS — N2581 Secondary hyperparathyroidism of renal origin: Secondary | ICD-10-CM | POA: Diagnosis not present

## 2021-10-05 DIAGNOSIS — N186 End stage renal disease: Secondary | ICD-10-CM | POA: Diagnosis not present

## 2021-10-05 DIAGNOSIS — D631 Anemia in chronic kidney disease: Secondary | ICD-10-CM | POA: Diagnosis not present

## 2021-10-05 DIAGNOSIS — Z992 Dependence on renal dialysis: Secondary | ICD-10-CM | POA: Diagnosis not present

## 2021-10-05 DIAGNOSIS — N2581 Secondary hyperparathyroidism of renal origin: Secondary | ICD-10-CM | POA: Diagnosis not present

## 2021-10-06 DIAGNOSIS — D631 Anemia in chronic kidney disease: Secondary | ICD-10-CM | POA: Diagnosis not present

## 2021-10-06 DIAGNOSIS — N2581 Secondary hyperparathyroidism of renal origin: Secondary | ICD-10-CM | POA: Diagnosis not present

## 2021-10-06 DIAGNOSIS — Z992 Dependence on renal dialysis: Secondary | ICD-10-CM | POA: Diagnosis not present

## 2021-10-06 DIAGNOSIS — N186 End stage renal disease: Secondary | ICD-10-CM | POA: Diagnosis not present

## 2021-10-07 DIAGNOSIS — N2581 Secondary hyperparathyroidism of renal origin: Secondary | ICD-10-CM | POA: Diagnosis not present

## 2021-10-07 DIAGNOSIS — Z992 Dependence on renal dialysis: Secondary | ICD-10-CM | POA: Diagnosis not present

## 2021-10-07 DIAGNOSIS — D631 Anemia in chronic kidney disease: Secondary | ICD-10-CM | POA: Diagnosis not present

## 2021-10-07 DIAGNOSIS — N186 End stage renal disease: Secondary | ICD-10-CM | POA: Diagnosis not present

## 2021-10-08 DIAGNOSIS — D631 Anemia in chronic kidney disease: Secondary | ICD-10-CM | POA: Diagnosis not present

## 2021-10-08 DIAGNOSIS — Z992 Dependence on renal dialysis: Secondary | ICD-10-CM | POA: Diagnosis not present

## 2021-10-08 DIAGNOSIS — N186 End stage renal disease: Secondary | ICD-10-CM | POA: Diagnosis not present

## 2021-10-08 DIAGNOSIS — N2581 Secondary hyperparathyroidism of renal origin: Secondary | ICD-10-CM | POA: Diagnosis not present

## 2021-10-09 DIAGNOSIS — N186 End stage renal disease: Secondary | ICD-10-CM | POA: Diagnosis not present

## 2021-10-09 DIAGNOSIS — N2581 Secondary hyperparathyroidism of renal origin: Secondary | ICD-10-CM | POA: Diagnosis not present

## 2021-10-09 DIAGNOSIS — Z992 Dependence on renal dialysis: Secondary | ICD-10-CM | POA: Diagnosis not present

## 2021-10-09 DIAGNOSIS — D631 Anemia in chronic kidney disease: Secondary | ICD-10-CM | POA: Diagnosis not present

## 2021-10-10 DIAGNOSIS — N186 End stage renal disease: Secondary | ICD-10-CM | POA: Diagnosis not present

## 2021-10-10 DIAGNOSIS — N2581 Secondary hyperparathyroidism of renal origin: Secondary | ICD-10-CM | POA: Diagnosis not present

## 2021-10-10 DIAGNOSIS — Z992 Dependence on renal dialysis: Secondary | ICD-10-CM | POA: Diagnosis not present

## 2021-10-10 DIAGNOSIS — D631 Anemia in chronic kidney disease: Secondary | ICD-10-CM | POA: Diagnosis not present

## 2021-10-11 DIAGNOSIS — D631 Anemia in chronic kidney disease: Secondary | ICD-10-CM | POA: Diagnosis not present

## 2021-10-11 DIAGNOSIS — N186 End stage renal disease: Secondary | ICD-10-CM | POA: Diagnosis not present

## 2021-10-11 DIAGNOSIS — N2581 Secondary hyperparathyroidism of renal origin: Secondary | ICD-10-CM | POA: Diagnosis not present

## 2021-10-11 DIAGNOSIS — Z992 Dependence on renal dialysis: Secondary | ICD-10-CM | POA: Diagnosis not present

## 2021-10-12 DIAGNOSIS — D631 Anemia in chronic kidney disease: Secondary | ICD-10-CM | POA: Diagnosis not present

## 2021-10-12 DIAGNOSIS — N186 End stage renal disease: Secondary | ICD-10-CM | POA: Diagnosis not present

## 2021-10-12 DIAGNOSIS — Z992 Dependence on renal dialysis: Secondary | ICD-10-CM | POA: Diagnosis not present

## 2021-10-12 DIAGNOSIS — N2581 Secondary hyperparathyroidism of renal origin: Secondary | ICD-10-CM | POA: Diagnosis not present

## 2021-10-13 ENCOUNTER — Other Ambulatory Visit: Payer: Self-pay | Admitting: Internal Medicine

## 2021-10-13 ENCOUNTER — Ambulatory Visit
Admission: RE | Admit: 2021-10-13 | Discharge: 2021-10-13 | Disposition: A | Discharge status: Home / Self Care | Payer: Federal, State, Local not specified - PPO | Source: Ambulatory Visit | Attending: Internal Medicine | Admitting: Internal Medicine

## 2021-10-13 DIAGNOSIS — Z992 Dependence on renal dialysis: Secondary | ICD-10-CM | POA: Diagnosis not present

## 2021-10-13 DIAGNOSIS — R051 Acute cough: Secondary | ICD-10-CM

## 2021-10-13 DIAGNOSIS — N186 End stage renal disease: Secondary | ICD-10-CM | POA: Diagnosis not present

## 2021-10-13 DIAGNOSIS — R059 Cough, unspecified: Secondary | ICD-10-CM | POA: Diagnosis not present

## 2021-10-13 DIAGNOSIS — E7800 Pure hypercholesterolemia, unspecified: Secondary | ICD-10-CM | POA: Diagnosis not present

## 2021-10-13 DIAGNOSIS — N2581 Secondary hyperparathyroidism of renal origin: Secondary | ICD-10-CM | POA: Diagnosis not present

## 2021-10-13 DIAGNOSIS — I10 Essential (primary) hypertension: Secondary | ICD-10-CM | POA: Diagnosis not present

## 2021-10-13 DIAGNOSIS — D631 Anemia in chronic kidney disease: Secondary | ICD-10-CM | POA: Diagnosis not present

## 2021-10-14 DIAGNOSIS — Z992 Dependence on renal dialysis: Secondary | ICD-10-CM | POA: Diagnosis not present

## 2021-10-14 DIAGNOSIS — N2581 Secondary hyperparathyroidism of renal origin: Secondary | ICD-10-CM | POA: Diagnosis not present

## 2021-10-14 DIAGNOSIS — D631 Anemia in chronic kidney disease: Secondary | ICD-10-CM | POA: Diagnosis not present

## 2021-10-14 DIAGNOSIS — N186 End stage renal disease: Secondary | ICD-10-CM | POA: Diagnosis not present

## 2021-10-15 DIAGNOSIS — D631 Anemia in chronic kidney disease: Secondary | ICD-10-CM | POA: Diagnosis not present

## 2021-10-15 DIAGNOSIS — N186 End stage renal disease: Secondary | ICD-10-CM | POA: Diagnosis not present

## 2021-10-15 DIAGNOSIS — N2581 Secondary hyperparathyroidism of renal origin: Secondary | ICD-10-CM | POA: Diagnosis not present

## 2021-10-15 DIAGNOSIS — Z992 Dependence on renal dialysis: Secondary | ICD-10-CM | POA: Diagnosis not present

## 2021-10-16 DIAGNOSIS — N2581 Secondary hyperparathyroidism of renal origin: Secondary | ICD-10-CM | POA: Diagnosis not present

## 2021-10-16 DIAGNOSIS — D631 Anemia in chronic kidney disease: Secondary | ICD-10-CM | POA: Diagnosis not present

## 2021-10-16 DIAGNOSIS — Z992 Dependence on renal dialysis: Secondary | ICD-10-CM | POA: Diagnosis not present

## 2021-10-16 DIAGNOSIS — N186 End stage renal disease: Secondary | ICD-10-CM | POA: Diagnosis not present

## 2021-10-17 DIAGNOSIS — N186 End stage renal disease: Secondary | ICD-10-CM | POA: Diagnosis not present

## 2021-10-17 DIAGNOSIS — N2581 Secondary hyperparathyroidism of renal origin: Secondary | ICD-10-CM | POA: Diagnosis not present

## 2021-10-17 DIAGNOSIS — Z992 Dependence on renal dialysis: Secondary | ICD-10-CM | POA: Diagnosis not present

## 2021-10-17 DIAGNOSIS — D631 Anemia in chronic kidney disease: Secondary | ICD-10-CM | POA: Diagnosis not present

## 2021-10-18 ENCOUNTER — Ambulatory Visit: Payer: Federal, State, Local not specified - PPO | Admitting: Critical Care Medicine

## 2021-10-18 DIAGNOSIS — N186 End stage renal disease: Secondary | ICD-10-CM | POA: Diagnosis not present

## 2021-10-18 DIAGNOSIS — D631 Anemia in chronic kidney disease: Secondary | ICD-10-CM | POA: Diagnosis not present

## 2021-10-18 DIAGNOSIS — Z992 Dependence on renal dialysis: Secondary | ICD-10-CM | POA: Diagnosis not present

## 2021-10-18 DIAGNOSIS — N2581 Secondary hyperparathyroidism of renal origin: Secondary | ICD-10-CM | POA: Diagnosis not present

## 2021-10-19 DIAGNOSIS — D631 Anemia in chronic kidney disease: Secondary | ICD-10-CM | POA: Diagnosis not present

## 2021-10-19 DIAGNOSIS — N2581 Secondary hyperparathyroidism of renal origin: Secondary | ICD-10-CM | POA: Diagnosis not present

## 2021-10-19 DIAGNOSIS — Z992 Dependence on renal dialysis: Secondary | ICD-10-CM | POA: Diagnosis not present

## 2021-10-19 DIAGNOSIS — N186 End stage renal disease: Secondary | ICD-10-CM | POA: Diagnosis not present

## 2021-10-20 DIAGNOSIS — I420 Dilated cardiomyopathy: Secondary | ICD-10-CM | POA: Diagnosis not present

## 2021-10-20 DIAGNOSIS — N186 End stage renal disease: Secondary | ICD-10-CM | POA: Diagnosis not present

## 2021-10-20 DIAGNOSIS — D631 Anemia in chronic kidney disease: Secondary | ICD-10-CM | POA: Diagnosis not present

## 2021-10-20 DIAGNOSIS — N2581 Secondary hyperparathyroidism of renal origin: Secondary | ICD-10-CM | POA: Diagnosis not present

## 2021-10-20 DIAGNOSIS — H6123 Impacted cerumen, bilateral: Secondary | ICD-10-CM | POA: Diagnosis not present

## 2021-10-20 DIAGNOSIS — R053 Chronic cough: Secondary | ICD-10-CM | POA: Diagnosis not present

## 2021-10-20 DIAGNOSIS — Z992 Dependence on renal dialysis: Secondary | ICD-10-CM | POA: Diagnosis not present

## 2021-10-21 DIAGNOSIS — N2581 Secondary hyperparathyroidism of renal origin: Secondary | ICD-10-CM | POA: Diagnosis not present

## 2021-10-21 DIAGNOSIS — Z992 Dependence on renal dialysis: Secondary | ICD-10-CM | POA: Diagnosis not present

## 2021-10-21 DIAGNOSIS — N186 End stage renal disease: Secondary | ICD-10-CM | POA: Diagnosis not present

## 2021-10-21 DIAGNOSIS — D631 Anemia in chronic kidney disease: Secondary | ICD-10-CM | POA: Diagnosis not present

## 2021-10-22 DIAGNOSIS — Z992 Dependence on renal dialysis: Secondary | ICD-10-CM | POA: Diagnosis not present

## 2021-10-22 DIAGNOSIS — D631 Anemia in chronic kidney disease: Secondary | ICD-10-CM | POA: Diagnosis not present

## 2021-10-22 DIAGNOSIS — N2581 Secondary hyperparathyroidism of renal origin: Secondary | ICD-10-CM | POA: Diagnosis not present

## 2021-10-22 DIAGNOSIS — N186 End stage renal disease: Secondary | ICD-10-CM | POA: Diagnosis not present

## 2021-10-23 DIAGNOSIS — D631 Anemia in chronic kidney disease: Secondary | ICD-10-CM | POA: Diagnosis not present

## 2021-10-23 DIAGNOSIS — N2581 Secondary hyperparathyroidism of renal origin: Secondary | ICD-10-CM | POA: Diagnosis not present

## 2021-10-23 DIAGNOSIS — Z992 Dependence on renal dialysis: Secondary | ICD-10-CM | POA: Diagnosis not present

## 2021-10-23 DIAGNOSIS — N186 End stage renal disease: Secondary | ICD-10-CM | POA: Diagnosis not present

## 2021-10-24 DIAGNOSIS — Z992 Dependence on renal dialysis: Secondary | ICD-10-CM | POA: Diagnosis not present

## 2021-10-24 DIAGNOSIS — N2581 Secondary hyperparathyroidism of renal origin: Secondary | ICD-10-CM | POA: Diagnosis not present

## 2021-10-24 DIAGNOSIS — N186 End stage renal disease: Secondary | ICD-10-CM | POA: Diagnosis not present

## 2021-10-24 DIAGNOSIS — D631 Anemia in chronic kidney disease: Secondary | ICD-10-CM | POA: Diagnosis not present

## 2021-10-25 DIAGNOSIS — D631 Anemia in chronic kidney disease: Secondary | ICD-10-CM | POA: Diagnosis not present

## 2021-10-25 DIAGNOSIS — Z992 Dependence on renal dialysis: Secondary | ICD-10-CM | POA: Diagnosis not present

## 2021-10-25 DIAGNOSIS — N186 End stage renal disease: Secondary | ICD-10-CM | POA: Diagnosis not present

## 2021-10-25 DIAGNOSIS — N2581 Secondary hyperparathyroidism of renal origin: Secondary | ICD-10-CM | POA: Diagnosis not present

## 2021-10-26 DIAGNOSIS — D631 Anemia in chronic kidney disease: Secondary | ICD-10-CM | POA: Diagnosis not present

## 2021-10-26 DIAGNOSIS — N2581 Secondary hyperparathyroidism of renal origin: Secondary | ICD-10-CM | POA: Diagnosis not present

## 2021-10-26 DIAGNOSIS — N186 End stage renal disease: Secondary | ICD-10-CM | POA: Diagnosis not present

## 2021-10-26 DIAGNOSIS — Z992 Dependence on renal dialysis: Secondary | ICD-10-CM | POA: Diagnosis not present

## 2021-10-27 DIAGNOSIS — R0602 Shortness of breath: Secondary | ICD-10-CM | POA: Diagnosis not present

## 2021-10-27 DIAGNOSIS — N186 End stage renal disease: Secondary | ICD-10-CM | POA: Diagnosis not present

## 2021-10-27 DIAGNOSIS — Z992 Dependence on renal dialysis: Secondary | ICD-10-CM | POA: Diagnosis not present

## 2021-10-27 DIAGNOSIS — N2581 Secondary hyperparathyroidism of renal origin: Secondary | ICD-10-CM | POA: Diagnosis not present

## 2021-10-27 DIAGNOSIS — D631 Anemia in chronic kidney disease: Secondary | ICD-10-CM | POA: Diagnosis not present

## 2021-10-28 DIAGNOSIS — N186 End stage renal disease: Secondary | ICD-10-CM | POA: Diagnosis not present

## 2021-10-28 DIAGNOSIS — N2581 Secondary hyperparathyroidism of renal origin: Secondary | ICD-10-CM | POA: Diagnosis not present

## 2021-10-28 DIAGNOSIS — Z992 Dependence on renal dialysis: Secondary | ICD-10-CM | POA: Diagnosis not present

## 2021-10-28 DIAGNOSIS — D631 Anemia in chronic kidney disease: Secondary | ICD-10-CM | POA: Diagnosis not present

## 2021-10-29 DIAGNOSIS — N186 End stage renal disease: Secondary | ICD-10-CM | POA: Diagnosis not present

## 2021-10-29 DIAGNOSIS — N2581 Secondary hyperparathyroidism of renal origin: Secondary | ICD-10-CM | POA: Diagnosis not present

## 2021-10-29 DIAGNOSIS — Z992 Dependence on renal dialysis: Secondary | ICD-10-CM | POA: Diagnosis not present

## 2021-10-29 DIAGNOSIS — D631 Anemia in chronic kidney disease: Secondary | ICD-10-CM | POA: Diagnosis not present

## 2021-10-30 DIAGNOSIS — N2581 Secondary hyperparathyroidism of renal origin: Secondary | ICD-10-CM | POA: Diagnosis not present

## 2021-10-30 DIAGNOSIS — Z992 Dependence on renal dialysis: Secondary | ICD-10-CM | POA: Diagnosis not present

## 2021-10-30 DIAGNOSIS — D631 Anemia in chronic kidney disease: Secondary | ICD-10-CM | POA: Diagnosis not present

## 2021-10-30 DIAGNOSIS — N186 End stage renal disease: Secondary | ICD-10-CM | POA: Diagnosis not present

## 2021-10-31 DIAGNOSIS — D631 Anemia in chronic kidney disease: Secondary | ICD-10-CM | POA: Diagnosis not present

## 2021-10-31 DIAGNOSIS — N186 End stage renal disease: Secondary | ICD-10-CM | POA: Diagnosis not present

## 2021-10-31 DIAGNOSIS — N2581 Secondary hyperparathyroidism of renal origin: Secondary | ICD-10-CM | POA: Diagnosis not present

## 2021-10-31 DIAGNOSIS — Z992 Dependence on renal dialysis: Secondary | ICD-10-CM | POA: Diagnosis not present

## 2021-11-01 DIAGNOSIS — Z992 Dependence on renal dialysis: Secondary | ICD-10-CM | POA: Diagnosis not present

## 2021-11-01 DIAGNOSIS — N186 End stage renal disease: Secondary | ICD-10-CM | POA: Diagnosis not present

## 2021-11-01 DIAGNOSIS — D631 Anemia in chronic kidney disease: Secondary | ICD-10-CM | POA: Diagnosis not present

## 2021-11-01 DIAGNOSIS — N2581 Secondary hyperparathyroidism of renal origin: Secondary | ICD-10-CM | POA: Diagnosis not present

## 2021-11-02 DIAGNOSIS — N186 End stage renal disease: Secondary | ICD-10-CM | POA: Diagnosis not present

## 2021-11-02 DIAGNOSIS — Z992 Dependence on renal dialysis: Secondary | ICD-10-CM | POA: Diagnosis not present

## 2021-11-02 DIAGNOSIS — N2581 Secondary hyperparathyroidism of renal origin: Secondary | ICD-10-CM | POA: Diagnosis not present

## 2021-11-02 DIAGNOSIS — D631 Anemia in chronic kidney disease: Secondary | ICD-10-CM | POA: Diagnosis not present

## 2021-11-03 DIAGNOSIS — Z992 Dependence on renal dialysis: Secondary | ICD-10-CM | POA: Diagnosis not present

## 2021-11-03 DIAGNOSIS — N2581 Secondary hyperparathyroidism of renal origin: Secondary | ICD-10-CM | POA: Diagnosis not present

## 2021-11-03 DIAGNOSIS — D631 Anemia in chronic kidney disease: Secondary | ICD-10-CM | POA: Diagnosis not present

## 2021-11-03 DIAGNOSIS — N186 End stage renal disease: Secondary | ICD-10-CM | POA: Diagnosis not present

## 2021-11-04 ENCOUNTER — Other Ambulatory Visit: Payer: Self-pay | Admitting: Physician Assistant

## 2021-11-04 DIAGNOSIS — D631 Anemia in chronic kidney disease: Secondary | ICD-10-CM | POA: Diagnosis not present

## 2021-11-04 DIAGNOSIS — N186 End stage renal disease: Secondary | ICD-10-CM | POA: Diagnosis not present

## 2021-11-04 DIAGNOSIS — Z992 Dependence on renal dialysis: Secondary | ICD-10-CM | POA: Diagnosis not present

## 2021-11-04 DIAGNOSIS — N2581 Secondary hyperparathyroidism of renal origin: Secondary | ICD-10-CM | POA: Diagnosis not present

## 2021-11-05 DIAGNOSIS — N2581 Secondary hyperparathyroidism of renal origin: Secondary | ICD-10-CM | POA: Diagnosis not present

## 2021-11-05 DIAGNOSIS — D631 Anemia in chronic kidney disease: Secondary | ICD-10-CM | POA: Diagnosis not present

## 2021-11-05 DIAGNOSIS — N186 End stage renal disease: Secondary | ICD-10-CM | POA: Diagnosis not present

## 2021-11-05 DIAGNOSIS — Z992 Dependence on renal dialysis: Secondary | ICD-10-CM | POA: Diagnosis not present

## 2021-11-05 MED ORDER — SILDENAFIL CITRATE 20 MG PO TABS: 20.0000 mg | ORAL_TABLET | Freq: Three times a day (TID) | ORAL | 1 refills | Status: DC

## 2021-11-06 DIAGNOSIS — N2581 Secondary hyperparathyroidism of renal origin: Secondary | ICD-10-CM | POA: Diagnosis not present

## 2021-11-06 DIAGNOSIS — Z992 Dependence on renal dialysis: Secondary | ICD-10-CM | POA: Diagnosis not present

## 2021-11-06 DIAGNOSIS — N186 End stage renal disease: Secondary | ICD-10-CM | POA: Diagnosis not present

## 2021-11-06 DIAGNOSIS — D631 Anemia in chronic kidney disease: Secondary | ICD-10-CM | POA: Diagnosis not present

## 2021-11-07 DIAGNOSIS — Z992 Dependence on renal dialysis: Secondary | ICD-10-CM | POA: Diagnosis not present

## 2021-11-07 DIAGNOSIS — N2581 Secondary hyperparathyroidism of renal origin: Secondary | ICD-10-CM | POA: Diagnosis not present

## 2021-11-07 DIAGNOSIS — N186 End stage renal disease: Secondary | ICD-10-CM | POA: Diagnosis not present

## 2021-11-07 DIAGNOSIS — D631 Anemia in chronic kidney disease: Secondary | ICD-10-CM | POA: Diagnosis not present

## 2021-11-08 DIAGNOSIS — N186 End stage renal disease: Secondary | ICD-10-CM | POA: Diagnosis not present

## 2021-11-08 DIAGNOSIS — D631 Anemia in chronic kidney disease: Secondary | ICD-10-CM | POA: Diagnosis not present

## 2021-11-08 DIAGNOSIS — N2581 Secondary hyperparathyroidism of renal origin: Secondary | ICD-10-CM | POA: Diagnosis not present

## 2021-11-08 DIAGNOSIS — Z992 Dependence on renal dialysis: Secondary | ICD-10-CM | POA: Diagnosis not present

## 2021-11-09 DIAGNOSIS — N2581 Secondary hyperparathyroidism of renal origin: Secondary | ICD-10-CM | POA: Diagnosis not present

## 2021-11-09 DIAGNOSIS — Z992 Dependence on renal dialysis: Secondary | ICD-10-CM | POA: Diagnosis not present

## 2021-11-09 DIAGNOSIS — D631 Anemia in chronic kidney disease: Secondary | ICD-10-CM | POA: Diagnosis not present

## 2021-11-09 DIAGNOSIS — N186 End stage renal disease: Secondary | ICD-10-CM | POA: Diagnosis not present

## 2021-11-10 DIAGNOSIS — G4733 Obstructive sleep apnea (adult) (pediatric): Secondary | ICD-10-CM | POA: Diagnosis not present

## 2021-11-10 DIAGNOSIS — N186 End stage renal disease: Secondary | ICD-10-CM | POA: Diagnosis not present

## 2021-11-10 DIAGNOSIS — N2581 Secondary hyperparathyroidism of renal origin: Secondary | ICD-10-CM | POA: Diagnosis not present

## 2021-11-10 DIAGNOSIS — Z992 Dependence on renal dialysis: Secondary | ICD-10-CM | POA: Diagnosis not present

## 2021-11-10 DIAGNOSIS — D631 Anemia in chronic kidney disease: Secondary | ICD-10-CM | POA: Diagnosis not present

## 2021-11-11 DIAGNOSIS — Z992 Dependence on renal dialysis: Secondary | ICD-10-CM | POA: Diagnosis not present

## 2021-11-11 DIAGNOSIS — D631 Anemia in chronic kidney disease: Secondary | ICD-10-CM | POA: Diagnosis not present

## 2021-11-11 DIAGNOSIS — N2581 Secondary hyperparathyroidism of renal origin: Secondary | ICD-10-CM | POA: Diagnosis not present

## 2021-11-11 DIAGNOSIS — N186 End stage renal disease: Secondary | ICD-10-CM | POA: Diagnosis not present

## 2021-11-12 ENCOUNTER — Encounter: Payer: Self-pay | Admitting: Internal Medicine

## 2021-11-12 DIAGNOSIS — Z992 Dependence on renal dialysis: Secondary | ICD-10-CM | POA: Diagnosis not present

## 2021-11-12 DIAGNOSIS — E1122 Type 2 diabetes mellitus with diabetic chronic kidney disease: Secondary | ICD-10-CM | POA: Diagnosis not present

## 2021-11-12 DIAGNOSIS — N2581 Secondary hyperparathyroidism of renal origin: Secondary | ICD-10-CM | POA: Diagnosis not present

## 2021-11-12 DIAGNOSIS — D631 Anemia in chronic kidney disease: Secondary | ICD-10-CM | POA: Diagnosis not present

## 2021-11-12 DIAGNOSIS — N186 End stage renal disease: Secondary | ICD-10-CM | POA: Diagnosis not present

## 2021-11-13 DIAGNOSIS — N2581 Secondary hyperparathyroidism of renal origin: Secondary | ICD-10-CM | POA: Diagnosis not present

## 2021-11-13 DIAGNOSIS — Z992 Dependence on renal dialysis: Secondary | ICD-10-CM | POA: Diagnosis not present

## 2021-11-13 DIAGNOSIS — D631 Anemia in chronic kidney disease: Secondary | ICD-10-CM | POA: Diagnosis not present

## 2021-11-13 DIAGNOSIS — Z4932 Encounter for adequacy testing for peritoneal dialysis: Secondary | ICD-10-CM | POA: Diagnosis not present

## 2021-11-13 DIAGNOSIS — N186 End stage renal disease: Secondary | ICD-10-CM | POA: Diagnosis not present

## 2021-11-14 DIAGNOSIS — Z992 Dependence on renal dialysis: Secondary | ICD-10-CM | POA: Diagnosis not present

## 2021-11-14 DIAGNOSIS — N186 End stage renal disease: Secondary | ICD-10-CM | POA: Diagnosis not present

## 2021-11-14 DIAGNOSIS — N2581 Secondary hyperparathyroidism of renal origin: Secondary | ICD-10-CM | POA: Diagnosis not present

## 2021-11-14 DIAGNOSIS — Z4932 Encounter for adequacy testing for peritoneal dialysis: Secondary | ICD-10-CM | POA: Diagnosis not present

## 2021-11-14 DIAGNOSIS — D631 Anemia in chronic kidney disease: Secondary | ICD-10-CM | POA: Diagnosis not present

## 2021-11-15 DIAGNOSIS — N2581 Secondary hyperparathyroidism of renal origin: Secondary | ICD-10-CM | POA: Diagnosis not present

## 2021-11-15 DIAGNOSIS — D631 Anemia in chronic kidney disease: Secondary | ICD-10-CM | POA: Diagnosis not present

## 2021-11-15 DIAGNOSIS — N186 End stage renal disease: Secondary | ICD-10-CM | POA: Diagnosis not present

## 2021-11-15 DIAGNOSIS — Z4932 Encounter for adequacy testing for peritoneal dialysis: Secondary | ICD-10-CM | POA: Diagnosis not present

## 2021-11-15 DIAGNOSIS — Z992 Dependence on renal dialysis: Secondary | ICD-10-CM | POA: Diagnosis not present

## 2021-11-16 DIAGNOSIS — Z992 Dependence on renal dialysis: Secondary | ICD-10-CM | POA: Diagnosis not present

## 2021-11-16 DIAGNOSIS — D631 Anemia in chronic kidney disease: Secondary | ICD-10-CM | POA: Diagnosis not present

## 2021-11-16 DIAGNOSIS — N186 End stage renal disease: Secondary | ICD-10-CM | POA: Diagnosis not present

## 2021-11-16 DIAGNOSIS — N2581 Secondary hyperparathyroidism of renal origin: Secondary | ICD-10-CM | POA: Diagnosis not present

## 2021-11-16 DIAGNOSIS — Z4932 Encounter for adequacy testing for peritoneal dialysis: Secondary | ICD-10-CM | POA: Diagnosis not present

## 2021-11-17 DIAGNOSIS — N2581 Secondary hyperparathyroidism of renal origin: Secondary | ICD-10-CM | POA: Diagnosis not present

## 2021-11-17 DIAGNOSIS — D631 Anemia in chronic kidney disease: Secondary | ICD-10-CM | POA: Diagnosis not present

## 2021-11-17 DIAGNOSIS — Z4932 Encounter for adequacy testing for peritoneal dialysis: Secondary | ICD-10-CM | POA: Diagnosis not present

## 2021-11-17 DIAGNOSIS — Z992 Dependence on renal dialysis: Secondary | ICD-10-CM | POA: Diagnosis not present

## 2021-11-17 DIAGNOSIS — N186 End stage renal disease: Secondary | ICD-10-CM | POA: Diagnosis not present

## 2021-11-18 DIAGNOSIS — Z992 Dependence on renal dialysis: Secondary | ICD-10-CM | POA: Diagnosis not present

## 2021-11-18 DIAGNOSIS — N186 End stage renal disease: Secondary | ICD-10-CM | POA: Diagnosis not present

## 2021-11-18 DIAGNOSIS — N2581 Secondary hyperparathyroidism of renal origin: Secondary | ICD-10-CM | POA: Diagnosis not present

## 2021-11-18 DIAGNOSIS — Z4932 Encounter for adequacy testing for peritoneal dialysis: Secondary | ICD-10-CM | POA: Diagnosis not present

## 2021-11-18 DIAGNOSIS — D631 Anemia in chronic kidney disease: Secondary | ICD-10-CM | POA: Diagnosis not present

## 2021-11-19 DIAGNOSIS — Z992 Dependence on renal dialysis: Secondary | ICD-10-CM | POA: Diagnosis not present

## 2021-11-19 DIAGNOSIS — D631 Anemia in chronic kidney disease: Secondary | ICD-10-CM | POA: Diagnosis not present

## 2021-11-19 DIAGNOSIS — N186 End stage renal disease: Secondary | ICD-10-CM | POA: Diagnosis not present

## 2021-11-19 DIAGNOSIS — Z4932 Encounter for adequacy testing for peritoneal dialysis: Secondary | ICD-10-CM | POA: Diagnosis not present

## 2021-11-19 DIAGNOSIS — N2581 Secondary hyperparathyroidism of renal origin: Secondary | ICD-10-CM | POA: Diagnosis not present

## 2021-11-20 DIAGNOSIS — Z4932 Encounter for adequacy testing for peritoneal dialysis: Secondary | ICD-10-CM | POA: Diagnosis not present

## 2021-11-20 DIAGNOSIS — N186 End stage renal disease: Secondary | ICD-10-CM | POA: Diagnosis not present

## 2021-11-20 DIAGNOSIS — N2581 Secondary hyperparathyroidism of renal origin: Secondary | ICD-10-CM | POA: Diagnosis not present

## 2021-11-20 DIAGNOSIS — D631 Anemia in chronic kidney disease: Secondary | ICD-10-CM | POA: Diagnosis not present

## 2021-11-20 DIAGNOSIS — Z992 Dependence on renal dialysis: Secondary | ICD-10-CM | POA: Diagnosis not present

## 2021-11-21 DIAGNOSIS — Z4932 Encounter for adequacy testing for peritoneal dialysis: Secondary | ICD-10-CM | POA: Diagnosis not present

## 2021-11-21 DIAGNOSIS — N186 End stage renal disease: Secondary | ICD-10-CM | POA: Diagnosis not present

## 2021-11-21 DIAGNOSIS — N2581 Secondary hyperparathyroidism of renal origin: Secondary | ICD-10-CM | POA: Diagnosis not present

## 2021-11-21 DIAGNOSIS — D631 Anemia in chronic kidney disease: Secondary | ICD-10-CM | POA: Diagnosis not present

## 2021-11-21 DIAGNOSIS — Z992 Dependence on renal dialysis: Secondary | ICD-10-CM | POA: Diagnosis not present

## 2021-11-22 DIAGNOSIS — Z4932 Encounter for adequacy testing for peritoneal dialysis: Secondary | ICD-10-CM | POA: Diagnosis not present

## 2021-11-22 DIAGNOSIS — Z992 Dependence on renal dialysis: Secondary | ICD-10-CM | POA: Diagnosis not present

## 2021-11-22 DIAGNOSIS — N2581 Secondary hyperparathyroidism of renal origin: Secondary | ICD-10-CM | POA: Diagnosis not present

## 2021-11-22 DIAGNOSIS — D631 Anemia in chronic kidney disease: Secondary | ICD-10-CM | POA: Diagnosis not present

## 2021-11-22 DIAGNOSIS — N186 End stage renal disease: Secondary | ICD-10-CM | POA: Diagnosis not present

## 2021-11-23 DIAGNOSIS — Z4932 Encounter for adequacy testing for peritoneal dialysis: Secondary | ICD-10-CM | POA: Diagnosis not present

## 2021-11-23 DIAGNOSIS — D631 Anemia in chronic kidney disease: Secondary | ICD-10-CM | POA: Diagnosis not present

## 2021-11-23 DIAGNOSIS — N2581 Secondary hyperparathyroidism of renal origin: Secondary | ICD-10-CM | POA: Diagnosis not present

## 2021-11-23 DIAGNOSIS — N186 End stage renal disease: Secondary | ICD-10-CM | POA: Diagnosis not present

## 2021-11-23 DIAGNOSIS — Z992 Dependence on renal dialysis: Secondary | ICD-10-CM | POA: Diagnosis not present

## 2021-11-24 DIAGNOSIS — N186 End stage renal disease: Secondary | ICD-10-CM | POA: Diagnosis not present

## 2021-11-24 DIAGNOSIS — N2581 Secondary hyperparathyroidism of renal origin: Secondary | ICD-10-CM | POA: Diagnosis not present

## 2021-11-24 DIAGNOSIS — D631 Anemia in chronic kidney disease: Secondary | ICD-10-CM | POA: Diagnosis not present

## 2021-11-24 DIAGNOSIS — Z4932 Encounter for adequacy testing for peritoneal dialysis: Secondary | ICD-10-CM | POA: Diagnosis not present

## 2021-11-24 DIAGNOSIS — Z992 Dependence on renal dialysis: Secondary | ICD-10-CM | POA: Diagnosis not present

## 2021-11-25 DIAGNOSIS — D631 Anemia in chronic kidney disease: Secondary | ICD-10-CM | POA: Diagnosis not present

## 2021-11-25 DIAGNOSIS — Z4932 Encounter for adequacy testing for peritoneal dialysis: Secondary | ICD-10-CM | POA: Diagnosis not present

## 2021-11-25 DIAGNOSIS — N2581 Secondary hyperparathyroidism of renal origin: Secondary | ICD-10-CM | POA: Diagnosis not present

## 2021-11-25 DIAGNOSIS — Z992 Dependence on renal dialysis: Secondary | ICD-10-CM | POA: Diagnosis not present

## 2021-11-25 DIAGNOSIS — N186 End stage renal disease: Secondary | ICD-10-CM | POA: Diagnosis not present

## 2021-11-26 ENCOUNTER — Encounter: Payer: Self-pay | Admitting: Cardiology

## 2021-11-26 DIAGNOSIS — N186 End stage renal disease: Secondary | ICD-10-CM | POA: Diagnosis not present

## 2021-11-26 DIAGNOSIS — N2581 Secondary hyperparathyroidism of renal origin: Secondary | ICD-10-CM | POA: Diagnosis not present

## 2021-11-26 DIAGNOSIS — D631 Anemia in chronic kidney disease: Secondary | ICD-10-CM | POA: Diagnosis not present

## 2021-11-26 DIAGNOSIS — Z992 Dependence on renal dialysis: Secondary | ICD-10-CM | POA: Diagnosis not present

## 2021-11-26 DIAGNOSIS — Z4932 Encounter for adequacy testing for peritoneal dialysis: Secondary | ICD-10-CM | POA: Diagnosis not present

## 2021-11-26 MED ORDER — AMLODIPINE BESYLATE 2.5 MG PO TABS: 2.5000 mg | ORAL_TABLET | Freq: Every day | ORAL | Status: DC

## 2021-11-27 DIAGNOSIS — Z992 Dependence on renal dialysis: Secondary | ICD-10-CM | POA: Diagnosis not present

## 2021-11-27 DIAGNOSIS — N186 End stage renal disease: Secondary | ICD-10-CM | POA: Diagnosis not present

## 2021-11-27 DIAGNOSIS — N2581 Secondary hyperparathyroidism of renal origin: Secondary | ICD-10-CM | POA: Diagnosis not present

## 2021-11-27 DIAGNOSIS — Z4932 Encounter for adequacy testing for peritoneal dialysis: Secondary | ICD-10-CM | POA: Diagnosis not present

## 2021-11-27 DIAGNOSIS — D631 Anemia in chronic kidney disease: Secondary | ICD-10-CM | POA: Diagnosis not present

## 2021-11-28 DIAGNOSIS — N186 End stage renal disease: Secondary | ICD-10-CM | POA: Diagnosis not present

## 2021-11-28 DIAGNOSIS — Z4932 Encounter for adequacy testing for peritoneal dialysis: Secondary | ICD-10-CM | POA: Diagnosis not present

## 2021-11-28 DIAGNOSIS — D631 Anemia in chronic kidney disease: Secondary | ICD-10-CM | POA: Diagnosis not present

## 2021-11-28 DIAGNOSIS — N2581 Secondary hyperparathyroidism of renal origin: Secondary | ICD-10-CM | POA: Diagnosis not present

## 2021-11-28 DIAGNOSIS — Z992 Dependence on renal dialysis: Secondary | ICD-10-CM | POA: Diagnosis not present

## 2021-11-29 DIAGNOSIS — Z4932 Encounter for adequacy testing for peritoneal dialysis: Secondary | ICD-10-CM | POA: Diagnosis not present

## 2021-11-29 DIAGNOSIS — N186 End stage renal disease: Secondary | ICD-10-CM | POA: Diagnosis not present

## 2021-11-29 DIAGNOSIS — D631 Anemia in chronic kidney disease: Secondary | ICD-10-CM | POA: Diagnosis not present

## 2021-11-29 DIAGNOSIS — N2581 Secondary hyperparathyroidism of renal origin: Secondary | ICD-10-CM | POA: Diagnosis not present

## 2021-11-29 DIAGNOSIS — Z992 Dependence on renal dialysis: Secondary | ICD-10-CM | POA: Diagnosis not present

## 2021-11-30 DIAGNOSIS — N2581 Secondary hyperparathyroidism of renal origin: Secondary | ICD-10-CM | POA: Diagnosis not present

## 2021-11-30 DIAGNOSIS — Z992 Dependence on renal dialysis: Secondary | ICD-10-CM | POA: Diagnosis not present

## 2021-11-30 DIAGNOSIS — Z4932 Encounter for adequacy testing for peritoneal dialysis: Secondary | ICD-10-CM | POA: Diagnosis not present

## 2021-11-30 DIAGNOSIS — D631 Anemia in chronic kidney disease: Secondary | ICD-10-CM | POA: Diagnosis not present

## 2021-11-30 DIAGNOSIS — N186 End stage renal disease: Secondary | ICD-10-CM | POA: Diagnosis not present

## 2021-12-01 DIAGNOSIS — D631 Anemia in chronic kidney disease: Secondary | ICD-10-CM | POA: Diagnosis not present

## 2021-12-01 DIAGNOSIS — N186 End stage renal disease: Secondary | ICD-10-CM | POA: Diagnosis not present

## 2021-12-01 DIAGNOSIS — Z4932 Encounter for adequacy testing for peritoneal dialysis: Secondary | ICD-10-CM | POA: Diagnosis not present

## 2021-12-01 DIAGNOSIS — N2581 Secondary hyperparathyroidism of renal origin: Secondary | ICD-10-CM | POA: Diagnosis not present

## 2021-12-01 DIAGNOSIS — Z992 Dependence on renal dialysis: Secondary | ICD-10-CM | POA: Diagnosis not present

## 2021-12-02 DIAGNOSIS — D631 Anemia in chronic kidney disease: Secondary | ICD-10-CM | POA: Diagnosis not present

## 2021-12-02 DIAGNOSIS — Z992 Dependence on renal dialysis: Secondary | ICD-10-CM | POA: Diagnosis not present

## 2021-12-02 DIAGNOSIS — Z4932 Encounter for adequacy testing for peritoneal dialysis: Secondary | ICD-10-CM | POA: Diagnosis not present

## 2021-12-02 DIAGNOSIS — N2581 Secondary hyperparathyroidism of renal origin: Secondary | ICD-10-CM | POA: Diagnosis not present

## 2021-12-02 DIAGNOSIS — N186 End stage renal disease: Secondary | ICD-10-CM | POA: Diagnosis not present

## 2021-12-03 DIAGNOSIS — N2581 Secondary hyperparathyroidism of renal origin: Secondary | ICD-10-CM | POA: Diagnosis not present

## 2021-12-03 DIAGNOSIS — N186 End stage renal disease: Secondary | ICD-10-CM | POA: Diagnosis not present

## 2021-12-03 DIAGNOSIS — Z992 Dependence on renal dialysis: Secondary | ICD-10-CM | POA: Diagnosis not present

## 2021-12-03 DIAGNOSIS — D631 Anemia in chronic kidney disease: Secondary | ICD-10-CM | POA: Diagnosis not present

## 2021-12-03 DIAGNOSIS — Z4932 Encounter for adequacy testing for peritoneal dialysis: Secondary | ICD-10-CM | POA: Diagnosis not present

## 2021-12-04 DIAGNOSIS — N186 End stage renal disease: Secondary | ICD-10-CM | POA: Diagnosis not present

## 2021-12-04 DIAGNOSIS — N2581 Secondary hyperparathyroidism of renal origin: Secondary | ICD-10-CM | POA: Diagnosis not present

## 2021-12-04 DIAGNOSIS — Z4932 Encounter for adequacy testing for peritoneal dialysis: Secondary | ICD-10-CM | POA: Diagnosis not present

## 2021-12-04 DIAGNOSIS — Z992 Dependence on renal dialysis: Secondary | ICD-10-CM | POA: Diagnosis not present

## 2021-12-04 DIAGNOSIS — D631 Anemia in chronic kidney disease: Secondary | ICD-10-CM | POA: Diagnosis not present

## 2021-12-05 DIAGNOSIS — Z4932 Encounter for adequacy testing for peritoneal dialysis: Secondary | ICD-10-CM | POA: Diagnosis not present

## 2021-12-05 DIAGNOSIS — Z992 Dependence on renal dialysis: Secondary | ICD-10-CM | POA: Diagnosis not present

## 2021-12-05 DIAGNOSIS — N186 End stage renal disease: Secondary | ICD-10-CM | POA: Diagnosis not present

## 2021-12-05 DIAGNOSIS — N2581 Secondary hyperparathyroidism of renal origin: Secondary | ICD-10-CM | POA: Diagnosis not present

## 2021-12-05 DIAGNOSIS — D631 Anemia in chronic kidney disease: Secondary | ICD-10-CM | POA: Diagnosis not present

## 2021-12-06 DIAGNOSIS — Z0000 Encounter for general adult medical examination without abnormal findings: Secondary | ICD-10-CM | POA: Diagnosis not present

## 2021-12-06 DIAGNOSIS — E1122 Type 2 diabetes mellitus with diabetic chronic kidney disease: Secondary | ICD-10-CM | POA: Diagnosis not present

## 2021-12-06 DIAGNOSIS — N186 End stage renal disease: Secondary | ICD-10-CM | POA: Diagnosis not present

## 2021-12-06 DIAGNOSIS — N2581 Secondary hyperparathyroidism of renal origin: Secondary | ICD-10-CM | POA: Diagnosis not present

## 2021-12-06 DIAGNOSIS — Z4932 Encounter for adequacy testing for peritoneal dialysis: Secondary | ICD-10-CM | POA: Diagnosis not present

## 2021-12-06 DIAGNOSIS — D509 Iron deficiency anemia, unspecified: Secondary | ICD-10-CM | POA: Diagnosis not present

## 2021-12-06 DIAGNOSIS — D631 Anemia in chronic kidney disease: Secondary | ICD-10-CM | POA: Diagnosis not present

## 2021-12-06 DIAGNOSIS — I10 Essential (primary) hypertension: Secondary | ICD-10-CM | POA: Diagnosis not present

## 2021-12-06 DIAGNOSIS — Z992 Dependence on renal dialysis: Secondary | ICD-10-CM | POA: Diagnosis not present

## 2021-12-07 DIAGNOSIS — N2581 Secondary hyperparathyroidism of renal origin: Secondary | ICD-10-CM | POA: Diagnosis not present

## 2021-12-07 DIAGNOSIS — Z4932 Encounter for adequacy testing for peritoneal dialysis: Secondary | ICD-10-CM | POA: Diagnosis not present

## 2021-12-07 DIAGNOSIS — Z992 Dependence on renal dialysis: Secondary | ICD-10-CM | POA: Diagnosis not present

## 2021-12-07 DIAGNOSIS — N186 End stage renal disease: Secondary | ICD-10-CM | POA: Diagnosis not present

## 2021-12-07 DIAGNOSIS — D631 Anemia in chronic kidney disease: Secondary | ICD-10-CM | POA: Diagnosis not present

## 2021-12-08 DIAGNOSIS — Z4932 Encounter for adequacy testing for peritoneal dialysis: Secondary | ICD-10-CM | POA: Diagnosis not present

## 2021-12-08 DIAGNOSIS — N186 End stage renal disease: Secondary | ICD-10-CM | POA: Diagnosis not present

## 2021-12-08 DIAGNOSIS — Z992 Dependence on renal dialysis: Secondary | ICD-10-CM | POA: Diagnosis not present

## 2021-12-08 DIAGNOSIS — D631 Anemia in chronic kidney disease: Secondary | ICD-10-CM | POA: Diagnosis not present

## 2021-12-08 DIAGNOSIS — N2581 Secondary hyperparathyroidism of renal origin: Secondary | ICD-10-CM | POA: Diagnosis not present

## 2021-12-09 DIAGNOSIS — D631 Anemia in chronic kidney disease: Secondary | ICD-10-CM | POA: Diagnosis not present

## 2021-12-09 DIAGNOSIS — N2581 Secondary hyperparathyroidism of renal origin: Secondary | ICD-10-CM | POA: Diagnosis not present

## 2021-12-09 DIAGNOSIS — N186 End stage renal disease: Secondary | ICD-10-CM | POA: Diagnosis not present

## 2021-12-09 DIAGNOSIS — Z4932 Encounter for adequacy testing for peritoneal dialysis: Secondary | ICD-10-CM | POA: Diagnosis not present

## 2021-12-09 DIAGNOSIS — Z992 Dependence on renal dialysis: Secondary | ICD-10-CM | POA: Diagnosis not present

## 2021-12-10 DIAGNOSIS — Z4932 Encounter for adequacy testing for peritoneal dialysis: Secondary | ICD-10-CM | POA: Diagnosis not present

## 2021-12-10 DIAGNOSIS — N186 End stage renal disease: Secondary | ICD-10-CM | POA: Diagnosis not present

## 2021-12-10 DIAGNOSIS — Z992 Dependence on renal dialysis: Secondary | ICD-10-CM | POA: Diagnosis not present

## 2021-12-10 DIAGNOSIS — D631 Anemia in chronic kidney disease: Secondary | ICD-10-CM | POA: Diagnosis not present

## 2021-12-10 DIAGNOSIS — N2581 Secondary hyperparathyroidism of renal origin: Secondary | ICD-10-CM | POA: Diagnosis not present

## 2021-12-11 DIAGNOSIS — N186 End stage renal disease: Secondary | ICD-10-CM | POA: Diagnosis not present

## 2021-12-11 DIAGNOSIS — N2581 Secondary hyperparathyroidism of renal origin: Secondary | ICD-10-CM | POA: Diagnosis not present

## 2021-12-11 DIAGNOSIS — Z4932 Encounter for adequacy testing for peritoneal dialysis: Secondary | ICD-10-CM | POA: Diagnosis not present

## 2021-12-11 DIAGNOSIS — D631 Anemia in chronic kidney disease: Secondary | ICD-10-CM | POA: Diagnosis not present

## 2021-12-11 DIAGNOSIS — Z992 Dependence on renal dialysis: Secondary | ICD-10-CM | POA: Diagnosis not present

## 2021-12-12 DIAGNOSIS — N186 End stage renal disease: Secondary | ICD-10-CM | POA: Diagnosis not present

## 2021-12-12 DIAGNOSIS — D631 Anemia in chronic kidney disease: Secondary | ICD-10-CM | POA: Diagnosis not present

## 2021-12-12 DIAGNOSIS — N2581 Secondary hyperparathyroidism of renal origin: Secondary | ICD-10-CM | POA: Diagnosis not present

## 2021-12-12 DIAGNOSIS — Z992 Dependence on renal dialysis: Secondary | ICD-10-CM | POA: Diagnosis not present

## 2021-12-12 DIAGNOSIS — Z4932 Encounter for adequacy testing for peritoneal dialysis: Secondary | ICD-10-CM | POA: Diagnosis not present

## 2021-12-13 DIAGNOSIS — Z4932 Encounter for adequacy testing for peritoneal dialysis: Secondary | ICD-10-CM | POA: Diagnosis not present

## 2021-12-13 DIAGNOSIS — E1122 Type 2 diabetes mellitus with diabetic chronic kidney disease: Secondary | ICD-10-CM | POA: Diagnosis not present

## 2021-12-13 DIAGNOSIS — N186 End stage renal disease: Secondary | ICD-10-CM | POA: Diagnosis not present

## 2021-12-13 DIAGNOSIS — Z992 Dependence on renal dialysis: Secondary | ICD-10-CM | POA: Diagnosis not present

## 2021-12-13 DIAGNOSIS — D631 Anemia in chronic kidney disease: Secondary | ICD-10-CM | POA: Diagnosis not present

## 2021-12-13 DIAGNOSIS — N2581 Secondary hyperparathyroidism of renal origin: Secondary | ICD-10-CM | POA: Diagnosis not present

## 2021-12-14 DIAGNOSIS — D631 Anemia in chronic kidney disease: Secondary | ICD-10-CM | POA: Diagnosis not present

## 2021-12-14 DIAGNOSIS — N186 End stage renal disease: Secondary | ICD-10-CM | POA: Diagnosis not present

## 2021-12-14 DIAGNOSIS — N2581 Secondary hyperparathyroidism of renal origin: Secondary | ICD-10-CM | POA: Diagnosis not present

## 2021-12-14 DIAGNOSIS — Z992 Dependence on renal dialysis: Secondary | ICD-10-CM | POA: Diagnosis not present

## 2021-12-15 DIAGNOSIS — D631 Anemia in chronic kidney disease: Secondary | ICD-10-CM | POA: Diagnosis not present

## 2021-12-15 DIAGNOSIS — N186 End stage renal disease: Secondary | ICD-10-CM | POA: Diagnosis not present

## 2021-12-15 DIAGNOSIS — Z992 Dependence on renal dialysis: Secondary | ICD-10-CM | POA: Diagnosis not present

## 2021-12-15 DIAGNOSIS — N2581 Secondary hyperparathyroidism of renal origin: Secondary | ICD-10-CM | POA: Diagnosis not present

## 2021-12-16 DIAGNOSIS — N186 End stage renal disease: Secondary | ICD-10-CM | POA: Diagnosis not present

## 2021-12-16 DIAGNOSIS — Z992 Dependence on renal dialysis: Secondary | ICD-10-CM | POA: Diagnosis not present

## 2021-12-16 DIAGNOSIS — D631 Anemia in chronic kidney disease: Secondary | ICD-10-CM | POA: Diagnosis not present

## 2021-12-16 DIAGNOSIS — N2581 Secondary hyperparathyroidism of renal origin: Secondary | ICD-10-CM | POA: Diagnosis not present

## 2021-12-17 DIAGNOSIS — Z992 Dependence on renal dialysis: Secondary | ICD-10-CM | POA: Diagnosis not present

## 2021-12-17 DIAGNOSIS — N2581 Secondary hyperparathyroidism of renal origin: Secondary | ICD-10-CM | POA: Diagnosis not present

## 2021-12-17 DIAGNOSIS — N186 End stage renal disease: Secondary | ICD-10-CM | POA: Diagnosis not present

## 2021-12-17 DIAGNOSIS — D631 Anemia in chronic kidney disease: Secondary | ICD-10-CM | POA: Diagnosis not present

## 2021-12-18 DIAGNOSIS — D631 Anemia in chronic kidney disease: Secondary | ICD-10-CM | POA: Diagnosis not present

## 2021-12-18 DIAGNOSIS — N2581 Secondary hyperparathyroidism of renal origin: Secondary | ICD-10-CM | POA: Diagnosis not present

## 2021-12-18 DIAGNOSIS — N186 End stage renal disease: Secondary | ICD-10-CM | POA: Diagnosis not present

## 2021-12-18 DIAGNOSIS — Z992 Dependence on renal dialysis: Secondary | ICD-10-CM | POA: Diagnosis not present

## 2021-12-19 DIAGNOSIS — N2581 Secondary hyperparathyroidism of renal origin: Secondary | ICD-10-CM | POA: Diagnosis not present

## 2021-12-19 DIAGNOSIS — N186 End stage renal disease: Secondary | ICD-10-CM | POA: Diagnosis not present

## 2021-12-19 DIAGNOSIS — D631 Anemia in chronic kidney disease: Secondary | ICD-10-CM | POA: Diagnosis not present

## 2021-12-19 DIAGNOSIS — Z992 Dependence on renal dialysis: Secondary | ICD-10-CM | POA: Diagnosis not present

## 2021-12-20 DIAGNOSIS — N2581 Secondary hyperparathyroidism of renal origin: Secondary | ICD-10-CM | POA: Diagnosis not present

## 2021-12-20 DIAGNOSIS — N186 End stage renal disease: Secondary | ICD-10-CM | POA: Diagnosis not present

## 2021-12-20 DIAGNOSIS — Z992 Dependence on renal dialysis: Secondary | ICD-10-CM | POA: Diagnosis not present

## 2021-12-20 DIAGNOSIS — D631 Anemia in chronic kidney disease: Secondary | ICD-10-CM | POA: Diagnosis not present

## 2021-12-21 DIAGNOSIS — N186 End stage renal disease: Secondary | ICD-10-CM | POA: Diagnosis not present

## 2021-12-21 DIAGNOSIS — Z992 Dependence on renal dialysis: Secondary | ICD-10-CM | POA: Diagnosis not present

## 2021-12-21 DIAGNOSIS — D631 Anemia in chronic kidney disease: Secondary | ICD-10-CM | POA: Diagnosis not present

## 2021-12-21 DIAGNOSIS — N2581 Secondary hyperparathyroidism of renal origin: Secondary | ICD-10-CM | POA: Diagnosis not present

## 2021-12-24 DIAGNOSIS — Z992 Dependence on renal dialysis: Secondary | ICD-10-CM | POA: Diagnosis not present

## 2021-12-24 DIAGNOSIS — N186 End stage renal disease: Secondary | ICD-10-CM | POA: Diagnosis not present

## 2021-12-24 DIAGNOSIS — D631 Anemia in chronic kidney disease: Secondary | ICD-10-CM | POA: Diagnosis not present

## 2021-12-24 DIAGNOSIS — N2581 Secondary hyperparathyroidism of renal origin: Secondary | ICD-10-CM | POA: Diagnosis not present

## 2021-12-25 DIAGNOSIS — N186 End stage renal disease: Secondary | ICD-10-CM | POA: Diagnosis not present

## 2021-12-25 DIAGNOSIS — N2581 Secondary hyperparathyroidism of renal origin: Secondary | ICD-10-CM | POA: Diagnosis not present

## 2021-12-25 DIAGNOSIS — D631 Anemia in chronic kidney disease: Secondary | ICD-10-CM | POA: Diagnosis not present

## 2021-12-25 DIAGNOSIS — Z992 Dependence on renal dialysis: Secondary | ICD-10-CM | POA: Diagnosis not present

## 2021-12-26 DIAGNOSIS — D631 Anemia in chronic kidney disease: Secondary | ICD-10-CM | POA: Diagnosis not present

## 2021-12-26 DIAGNOSIS — Z992 Dependence on renal dialysis: Secondary | ICD-10-CM | POA: Diagnosis not present

## 2021-12-26 DIAGNOSIS — N2581 Secondary hyperparathyroidism of renal origin: Secondary | ICD-10-CM | POA: Diagnosis not present

## 2021-12-26 DIAGNOSIS — N186 End stage renal disease: Secondary | ICD-10-CM | POA: Diagnosis not present

## 2021-12-27 ENCOUNTER — Other Ambulatory Visit: Payer: Self-pay | Admitting: Cardiology

## 2021-12-27 DIAGNOSIS — H9190 Unspecified hearing loss, unspecified ear: Secondary | ICD-10-CM | POA: Insufficient documentation

## 2021-12-27 DIAGNOSIS — E7800 Pure hypercholesterolemia, unspecified: Secondary | ICD-10-CM | POA: Insufficient documentation

## 2021-12-27 DIAGNOSIS — Z86010 Personal history of colonic polyps: Secondary | ICD-10-CM | POA: Insufficient documentation

## 2021-12-27 DIAGNOSIS — D631 Anemia in chronic kidney disease: Secondary | ICD-10-CM | POA: Diagnosis not present

## 2021-12-27 DIAGNOSIS — N2581 Secondary hyperparathyroidism of renal origin: Secondary | ICD-10-CM | POA: Diagnosis not present

## 2021-12-27 DIAGNOSIS — Z992 Dependence on renal dialysis: Secondary | ICD-10-CM | POA: Diagnosis not present

## 2021-12-27 DIAGNOSIS — K760 Fatty (change of) liver, not elsewhere classified: Secondary | ICD-10-CM | POA: Insufficient documentation

## 2021-12-27 DIAGNOSIS — G4700 Insomnia, unspecified: Secondary | ICD-10-CM | POA: Insufficient documentation

## 2021-12-27 DIAGNOSIS — N186 End stage renal disease: Secondary | ICD-10-CM | POA: Diagnosis not present

## 2021-12-27 DIAGNOSIS — E1121 Type 2 diabetes mellitus with diabetic nephropathy: Secondary | ICD-10-CM | POA: Insufficient documentation

## 2021-12-27 DIAGNOSIS — I5189 Other ill-defined heart diseases: Secondary | ICD-10-CM | POA: Insufficient documentation

## 2021-12-27 NOTE — Telephone Encounter (Signed)
Per MyChart message 11/26/21: Dr. Radford Pax recommended you cut your Amlodipine down to 2.'5mg'$  daily and check your blood pressure daily for a week and call with your results. You may also send a list of your blood pressure readings in a MyChart message if you prefer.  Sent message to patient asking how he is doing on current 2.'5mg'$  dose, as refill request is for '5mg'$ .  Awaiting response prior to addressing refill request.

## 2021-12-28 DIAGNOSIS — D631 Anemia in chronic kidney disease: Secondary | ICD-10-CM | POA: Diagnosis not present

## 2021-12-28 DIAGNOSIS — N186 End stage renal disease: Secondary | ICD-10-CM | POA: Diagnosis not present

## 2021-12-28 DIAGNOSIS — N2581 Secondary hyperparathyroidism of renal origin: Secondary | ICD-10-CM | POA: Diagnosis not present

## 2021-12-28 DIAGNOSIS — Z992 Dependence on renal dialysis: Secondary | ICD-10-CM | POA: Diagnosis not present

## 2021-12-29 DIAGNOSIS — Z992 Dependence on renal dialysis: Secondary | ICD-10-CM | POA: Diagnosis not present

## 2021-12-29 DIAGNOSIS — N2581 Secondary hyperparathyroidism of renal origin: Secondary | ICD-10-CM | POA: Diagnosis not present

## 2021-12-29 DIAGNOSIS — N186 End stage renal disease: Secondary | ICD-10-CM | POA: Diagnosis not present

## 2021-12-29 DIAGNOSIS — D631 Anemia in chronic kidney disease: Secondary | ICD-10-CM | POA: Diagnosis not present

## 2021-12-30 DIAGNOSIS — N2581 Secondary hyperparathyroidism of renal origin: Secondary | ICD-10-CM | POA: Diagnosis not present

## 2021-12-30 DIAGNOSIS — Z992 Dependence on renal dialysis: Secondary | ICD-10-CM | POA: Diagnosis not present

## 2021-12-30 DIAGNOSIS — N186 End stage renal disease: Secondary | ICD-10-CM | POA: Diagnosis not present

## 2021-12-30 DIAGNOSIS — D631 Anemia in chronic kidney disease: Secondary | ICD-10-CM | POA: Diagnosis not present

## 2021-12-31 DIAGNOSIS — N2581 Secondary hyperparathyroidism of renal origin: Secondary | ICD-10-CM | POA: Diagnosis not present

## 2021-12-31 DIAGNOSIS — D631 Anemia in chronic kidney disease: Secondary | ICD-10-CM | POA: Diagnosis not present

## 2021-12-31 DIAGNOSIS — N186 End stage renal disease: Secondary | ICD-10-CM | POA: Diagnosis not present

## 2021-12-31 DIAGNOSIS — Z992 Dependence on renal dialysis: Secondary | ICD-10-CM | POA: Diagnosis not present

## 2022-01-01 DIAGNOSIS — Z992 Dependence on renal dialysis: Secondary | ICD-10-CM | POA: Diagnosis not present

## 2022-01-01 DIAGNOSIS — N2581 Secondary hyperparathyroidism of renal origin: Secondary | ICD-10-CM | POA: Diagnosis not present

## 2022-01-01 DIAGNOSIS — D631 Anemia in chronic kidney disease: Secondary | ICD-10-CM | POA: Diagnosis not present

## 2022-01-01 DIAGNOSIS — N186 End stage renal disease: Secondary | ICD-10-CM | POA: Diagnosis not present

## 2022-01-02 DIAGNOSIS — D631 Anemia in chronic kidney disease: Secondary | ICD-10-CM | POA: Diagnosis not present

## 2022-01-02 DIAGNOSIS — Z992 Dependence on renal dialysis: Secondary | ICD-10-CM | POA: Diagnosis not present

## 2022-01-02 DIAGNOSIS — N2581 Secondary hyperparathyroidism of renal origin: Secondary | ICD-10-CM | POA: Diagnosis not present

## 2022-01-02 DIAGNOSIS — N186 End stage renal disease: Secondary | ICD-10-CM | POA: Diagnosis not present

## 2022-01-03 DIAGNOSIS — N186 End stage renal disease: Secondary | ICD-10-CM | POA: Diagnosis not present

## 2022-01-03 DIAGNOSIS — D631 Anemia in chronic kidney disease: Secondary | ICD-10-CM | POA: Diagnosis not present

## 2022-01-03 DIAGNOSIS — Z992 Dependence on renal dialysis: Secondary | ICD-10-CM | POA: Diagnosis not present

## 2022-01-03 DIAGNOSIS — N2581 Secondary hyperparathyroidism of renal origin: Secondary | ICD-10-CM | POA: Diagnosis not present

## 2022-01-04 DIAGNOSIS — N2581 Secondary hyperparathyroidism of renal origin: Secondary | ICD-10-CM | POA: Diagnosis not present

## 2022-01-04 DIAGNOSIS — Z992 Dependence on renal dialysis: Secondary | ICD-10-CM | POA: Diagnosis not present

## 2022-01-04 DIAGNOSIS — D631 Anemia in chronic kidney disease: Secondary | ICD-10-CM | POA: Diagnosis not present

## 2022-01-04 DIAGNOSIS — N186 End stage renal disease: Secondary | ICD-10-CM | POA: Diagnosis not present

## 2022-01-05 DIAGNOSIS — Z992 Dependence on renal dialysis: Secondary | ICD-10-CM | POA: Diagnosis not present

## 2022-01-05 DIAGNOSIS — N186 End stage renal disease: Secondary | ICD-10-CM | POA: Diagnosis not present

## 2022-01-05 DIAGNOSIS — D631 Anemia in chronic kidney disease: Secondary | ICD-10-CM | POA: Diagnosis not present

## 2022-01-05 DIAGNOSIS — N2581 Secondary hyperparathyroidism of renal origin: Secondary | ICD-10-CM | POA: Diagnosis not present

## 2022-01-06 DIAGNOSIS — D631 Anemia in chronic kidney disease: Secondary | ICD-10-CM | POA: Diagnosis not present

## 2022-01-06 DIAGNOSIS — N186 End stage renal disease: Secondary | ICD-10-CM | POA: Diagnosis not present

## 2022-01-06 DIAGNOSIS — Z992 Dependence on renal dialysis: Secondary | ICD-10-CM | POA: Diagnosis not present

## 2022-01-06 DIAGNOSIS — N2581 Secondary hyperparathyroidism of renal origin: Secondary | ICD-10-CM | POA: Diagnosis not present

## 2022-01-07 ENCOUNTER — Telehealth: Payer: Self-pay | Admitting: Physician Assistant

## 2022-01-07 DIAGNOSIS — N186 End stage renal disease: Secondary | ICD-10-CM | POA: Diagnosis not present

## 2022-01-07 DIAGNOSIS — Z992 Dependence on renal dialysis: Secondary | ICD-10-CM | POA: Diagnosis not present

## 2022-01-07 DIAGNOSIS — D631 Anemia in chronic kidney disease: Secondary | ICD-10-CM | POA: Diagnosis not present

## 2022-01-07 DIAGNOSIS — N2581 Secondary hyperparathyroidism of renal origin: Secondary | ICD-10-CM | POA: Diagnosis not present

## 2022-01-07 NOTE — Telephone Encounter (Signed)
Left message for patient to call back and schedule Medicare Annual Wellness Visit (AWV) either virtually or in office. I left my number for patient to call [phone].   awvi 07/15/19 per [facility]  ; please schedule at anytime with health coach  This should be a 45 minute visit.    Please confirmed fed bcbs is 2nd

## 2022-01-08 DIAGNOSIS — N186 End stage renal disease: Secondary | ICD-10-CM | POA: Diagnosis not present

## 2022-01-08 DIAGNOSIS — D631 Anemia in chronic kidney disease: Secondary | ICD-10-CM | POA: Diagnosis not present

## 2022-01-08 DIAGNOSIS — Z992 Dependence on renal dialysis: Secondary | ICD-10-CM | POA: Diagnosis not present

## 2022-01-08 DIAGNOSIS — N2581 Secondary hyperparathyroidism of renal origin: Secondary | ICD-10-CM | POA: Diagnosis not present

## 2022-01-09 DIAGNOSIS — D631 Anemia in chronic kidney disease: Secondary | ICD-10-CM | POA: Diagnosis not present

## 2022-01-09 DIAGNOSIS — N186 End stage renal disease: Secondary | ICD-10-CM | POA: Diagnosis not present

## 2022-01-09 DIAGNOSIS — N2581 Secondary hyperparathyroidism of renal origin: Secondary | ICD-10-CM | POA: Diagnosis not present

## 2022-01-09 DIAGNOSIS — Z992 Dependence on renal dialysis: Secondary | ICD-10-CM | POA: Diagnosis not present

## 2022-01-10 DIAGNOSIS — N186 End stage renal disease: Secondary | ICD-10-CM | POA: Diagnosis not present

## 2022-01-10 DIAGNOSIS — N2581 Secondary hyperparathyroidism of renal origin: Secondary | ICD-10-CM | POA: Diagnosis not present

## 2022-01-10 DIAGNOSIS — Z992 Dependence on renal dialysis: Secondary | ICD-10-CM | POA: Diagnosis not present

## 2022-01-10 DIAGNOSIS — D631 Anemia in chronic kidney disease: Secondary | ICD-10-CM | POA: Diagnosis not present

## 2022-01-11 DIAGNOSIS — D631 Anemia in chronic kidney disease: Secondary | ICD-10-CM | POA: Diagnosis not present

## 2022-01-11 DIAGNOSIS — N2581 Secondary hyperparathyroidism of renal origin: Secondary | ICD-10-CM | POA: Diagnosis not present

## 2022-01-11 DIAGNOSIS — N186 End stage renal disease: Secondary | ICD-10-CM | POA: Diagnosis not present

## 2022-01-11 DIAGNOSIS — Z992 Dependence on renal dialysis: Secondary | ICD-10-CM | POA: Diagnosis not present

## 2022-01-12 DIAGNOSIS — N2581 Secondary hyperparathyroidism of renal origin: Secondary | ICD-10-CM | POA: Diagnosis not present

## 2022-01-12 DIAGNOSIS — N186 End stage renal disease: Secondary | ICD-10-CM | POA: Diagnosis not present

## 2022-01-12 DIAGNOSIS — Z992 Dependence on renal dialysis: Secondary | ICD-10-CM | POA: Diagnosis not present

## 2022-01-12 DIAGNOSIS — D631 Anemia in chronic kidney disease: Secondary | ICD-10-CM | POA: Diagnosis not present

## 2022-01-13 ENCOUNTER — Other Ambulatory Visit: Payer: Self-pay | Admitting: Cardiology

## 2022-01-13 DIAGNOSIS — D631 Anemia in chronic kidney disease: Secondary | ICD-10-CM | POA: Diagnosis not present

## 2022-01-13 DIAGNOSIS — N2581 Secondary hyperparathyroidism of renal origin: Secondary | ICD-10-CM | POA: Diagnosis not present

## 2022-01-13 DIAGNOSIS — E1122 Type 2 diabetes mellitus with diabetic chronic kidney disease: Secondary | ICD-10-CM | POA: Diagnosis not present

## 2022-01-13 DIAGNOSIS — Z992 Dependence on renal dialysis: Secondary | ICD-10-CM | POA: Diagnosis not present

## 2022-01-13 DIAGNOSIS — N186 End stage renal disease: Secondary | ICD-10-CM | POA: Diagnosis not present

## 2022-01-14 ENCOUNTER — Encounter (HOSPITAL_BASED_OUTPATIENT_CLINIC_OR_DEPARTMENT_OTHER): Payer: Self-pay | Admitting: Cardiology

## 2022-01-14 DIAGNOSIS — N2581 Secondary hyperparathyroidism of renal origin: Secondary | ICD-10-CM | POA: Diagnosis not present

## 2022-01-14 DIAGNOSIS — Z992 Dependence on renal dialysis: Secondary | ICD-10-CM | POA: Diagnosis not present

## 2022-01-14 DIAGNOSIS — D631 Anemia in chronic kidney disease: Secondary | ICD-10-CM | POA: Diagnosis not present

## 2022-01-14 DIAGNOSIS — N186 End stage renal disease: Secondary | ICD-10-CM | POA: Diagnosis not present

## 2022-01-14 NOTE — Telephone Encounter (Signed)
Called pt's pharmacy [facility] and gave a verbal order over the phone for medication zolpidem 12.5 mg, dispensing 30 tablets, with 3 refills, per Dr. Theodosia Blender nurse, Percival Spanish, RN. Pharmacist verbalized understanding.

## 2022-01-14 NOTE — Telephone Encounter (Signed)
Pt's pharmacy is requesting a refill on zolpidem. Would Dr. Radford Pax like to refill this medication? Please address

## 2022-01-15 DIAGNOSIS — N186 End stage renal disease: Secondary | ICD-10-CM | POA: Diagnosis not present

## 2022-01-15 DIAGNOSIS — N2581 Secondary hyperparathyroidism of renal origin: Secondary | ICD-10-CM | POA: Diagnosis not present

## 2022-01-15 DIAGNOSIS — Z992 Dependence on renal dialysis: Secondary | ICD-10-CM | POA: Diagnosis not present

## 2022-01-15 DIAGNOSIS — D631 Anemia in chronic kidney disease: Secondary | ICD-10-CM | POA: Diagnosis not present

## 2022-01-16 DIAGNOSIS — Z992 Dependence on renal dialysis: Secondary | ICD-10-CM | POA: Diagnosis not present

## 2022-01-16 DIAGNOSIS — N2581 Secondary hyperparathyroidism of renal origin: Secondary | ICD-10-CM | POA: Diagnosis not present

## 2022-01-16 DIAGNOSIS — N186 End stage renal disease: Secondary | ICD-10-CM | POA: Diagnosis not present

## 2022-01-16 DIAGNOSIS — D631 Anemia in chronic kidney disease: Secondary | ICD-10-CM | POA: Diagnosis not present

## 2022-01-17 DIAGNOSIS — D631 Anemia in chronic kidney disease: Secondary | ICD-10-CM | POA: Diagnosis not present

## 2022-01-17 DIAGNOSIS — Z992 Dependence on renal dialysis: Secondary | ICD-10-CM | POA: Diagnosis not present

## 2022-01-17 DIAGNOSIS — N2581 Secondary hyperparathyroidism of renal origin: Secondary | ICD-10-CM | POA: Diagnosis not present

## 2022-01-17 DIAGNOSIS — N186 End stage renal disease: Secondary | ICD-10-CM | POA: Diagnosis not present

## 2022-01-18 DIAGNOSIS — N2581 Secondary hyperparathyroidism of renal origin: Secondary | ICD-10-CM | POA: Diagnosis not present

## 2022-01-18 DIAGNOSIS — N186 End stage renal disease: Secondary | ICD-10-CM | POA: Diagnosis not present

## 2022-01-18 DIAGNOSIS — Z992 Dependence on renal dialysis: Secondary | ICD-10-CM | POA: Diagnosis not present

## 2022-01-18 DIAGNOSIS — D631 Anemia in chronic kidney disease: Secondary | ICD-10-CM | POA: Diagnosis not present

## 2022-01-19 ENCOUNTER — Encounter: Payer: Self-pay | Admitting: Internal Medicine

## 2022-01-19 DIAGNOSIS — D631 Anemia in chronic kidney disease: Secondary | ICD-10-CM | POA: Diagnosis not present

## 2022-01-19 DIAGNOSIS — N186 End stage renal disease: Secondary | ICD-10-CM | POA: Diagnosis not present

## 2022-01-19 DIAGNOSIS — Z992 Dependence on renal dialysis: Secondary | ICD-10-CM | POA: Diagnosis not present

## 2022-01-19 DIAGNOSIS — N2581 Secondary hyperparathyroidism of renal origin: Secondary | ICD-10-CM | POA: Diagnosis not present

## 2022-01-20 DIAGNOSIS — D631 Anemia in chronic kidney disease: Secondary | ICD-10-CM | POA: Diagnosis not present

## 2022-01-20 DIAGNOSIS — Z992 Dependence on renal dialysis: Secondary | ICD-10-CM | POA: Diagnosis not present

## 2022-01-20 DIAGNOSIS — N186 End stage renal disease: Secondary | ICD-10-CM | POA: Diagnosis not present

## 2022-01-20 DIAGNOSIS — N2581 Secondary hyperparathyroidism of renal origin: Secondary | ICD-10-CM | POA: Diagnosis not present

## 2022-01-21 DIAGNOSIS — D631 Anemia in chronic kidney disease: Secondary | ICD-10-CM | POA: Diagnosis not present

## 2022-01-21 DIAGNOSIS — Z992 Dependence on renal dialysis: Secondary | ICD-10-CM | POA: Diagnosis not present

## 2022-01-21 DIAGNOSIS — N2581 Secondary hyperparathyroidism of renal origin: Secondary | ICD-10-CM | POA: Diagnosis not present

## 2022-01-21 DIAGNOSIS — N186 End stage renal disease: Secondary | ICD-10-CM | POA: Diagnosis not present

## 2022-01-22 DIAGNOSIS — N2581 Secondary hyperparathyroidism of renal origin: Secondary | ICD-10-CM | POA: Diagnosis not present

## 2022-01-22 DIAGNOSIS — Z992 Dependence on renal dialysis: Secondary | ICD-10-CM | POA: Diagnosis not present

## 2022-01-22 DIAGNOSIS — D631 Anemia in chronic kidney disease: Secondary | ICD-10-CM | POA: Diagnosis not present

## 2022-01-22 DIAGNOSIS — N186 End stage renal disease: Secondary | ICD-10-CM | POA: Diagnosis not present

## 2022-01-23 DIAGNOSIS — Z992 Dependence on renal dialysis: Secondary | ICD-10-CM | POA: Diagnosis not present

## 2022-01-23 DIAGNOSIS — N186 End stage renal disease: Secondary | ICD-10-CM | POA: Diagnosis not present

## 2022-01-23 DIAGNOSIS — D631 Anemia in chronic kidney disease: Secondary | ICD-10-CM | POA: Diagnosis not present

## 2022-01-23 DIAGNOSIS — N2581 Secondary hyperparathyroidism of renal origin: Secondary | ICD-10-CM | POA: Diagnosis not present

## 2022-01-24 DIAGNOSIS — D631 Anemia in chronic kidney disease: Secondary | ICD-10-CM | POA: Diagnosis not present

## 2022-01-24 DIAGNOSIS — N186 End stage renal disease: Secondary | ICD-10-CM | POA: Diagnosis not present

## 2022-01-24 DIAGNOSIS — N2581 Secondary hyperparathyroidism of renal origin: Secondary | ICD-10-CM | POA: Diagnosis not present

## 2022-01-24 DIAGNOSIS — Z992 Dependence on renal dialysis: Secondary | ICD-10-CM | POA: Diagnosis not present

## 2022-01-25 DIAGNOSIS — N186 End stage renal disease: Secondary | ICD-10-CM | POA: Diagnosis not present

## 2022-01-25 DIAGNOSIS — D631 Anemia in chronic kidney disease: Secondary | ICD-10-CM | POA: Diagnosis not present

## 2022-01-25 DIAGNOSIS — Z992 Dependence on renal dialysis: Secondary | ICD-10-CM | POA: Diagnosis not present

## 2022-01-25 DIAGNOSIS — N2581 Secondary hyperparathyroidism of renal origin: Secondary | ICD-10-CM | POA: Diagnosis not present

## 2022-01-26 DIAGNOSIS — Z992 Dependence on renal dialysis: Secondary | ICD-10-CM | POA: Diagnosis not present

## 2022-01-26 DIAGNOSIS — D631 Anemia in chronic kidney disease: Secondary | ICD-10-CM | POA: Diagnosis not present

## 2022-01-26 DIAGNOSIS — N186 End stage renal disease: Secondary | ICD-10-CM | POA: Diagnosis not present

## 2022-01-26 DIAGNOSIS — N2581 Secondary hyperparathyroidism of renal origin: Secondary | ICD-10-CM | POA: Diagnosis not present

## 2022-01-27 DIAGNOSIS — Z992 Dependence on renal dialysis: Secondary | ICD-10-CM | POA: Diagnosis not present

## 2022-01-27 DIAGNOSIS — N2581 Secondary hyperparathyroidism of renal origin: Secondary | ICD-10-CM | POA: Diagnosis not present

## 2022-01-27 DIAGNOSIS — N186 End stage renal disease: Secondary | ICD-10-CM | POA: Diagnosis not present

## 2022-01-27 DIAGNOSIS — D631 Anemia in chronic kidney disease: Secondary | ICD-10-CM | POA: Diagnosis not present

## 2022-01-28 DIAGNOSIS — D631 Anemia in chronic kidney disease: Secondary | ICD-10-CM | POA: Diagnosis not present

## 2022-01-28 DIAGNOSIS — N2581 Secondary hyperparathyroidism of renal origin: Secondary | ICD-10-CM | POA: Diagnosis not present

## 2022-01-28 DIAGNOSIS — Z992 Dependence on renal dialysis: Secondary | ICD-10-CM | POA: Diagnosis not present

## 2022-01-28 DIAGNOSIS — N186 End stage renal disease: Secondary | ICD-10-CM | POA: Diagnosis not present

## 2022-01-29 DIAGNOSIS — N186 End stage renal disease: Secondary | ICD-10-CM | POA: Diagnosis not present

## 2022-01-29 DIAGNOSIS — D631 Anemia in chronic kidney disease: Secondary | ICD-10-CM | POA: Diagnosis not present

## 2022-01-29 DIAGNOSIS — Z992 Dependence on renal dialysis: Secondary | ICD-10-CM | POA: Diagnosis not present

## 2022-01-29 DIAGNOSIS — N2581 Secondary hyperparathyroidism of renal origin: Secondary | ICD-10-CM | POA: Diagnosis not present

## 2022-01-30 DIAGNOSIS — Z992 Dependence on renal dialysis: Secondary | ICD-10-CM | POA: Diagnosis not present

## 2022-01-30 DIAGNOSIS — N186 End stage renal disease: Secondary | ICD-10-CM | POA: Diagnosis not present

## 2022-01-30 DIAGNOSIS — N2581 Secondary hyperparathyroidism of renal origin: Secondary | ICD-10-CM | POA: Diagnosis not present

## 2022-01-30 DIAGNOSIS — D631 Anemia in chronic kidney disease: Secondary | ICD-10-CM | POA: Diagnosis not present

## 2022-01-31 DIAGNOSIS — N186 End stage renal disease: Secondary | ICD-10-CM | POA: Diagnosis not present

## 2022-01-31 DIAGNOSIS — Z992 Dependence on renal dialysis: Secondary | ICD-10-CM | POA: Diagnosis not present

## 2022-01-31 DIAGNOSIS — D631 Anemia in chronic kidney disease: Secondary | ICD-10-CM | POA: Diagnosis not present

## 2022-01-31 DIAGNOSIS — N2581 Secondary hyperparathyroidism of renal origin: Secondary | ICD-10-CM | POA: Diagnosis not present

## 2022-02-01 ENCOUNTER — Telehealth: Payer: Self-pay | Admitting: Physician Assistant

## 2022-02-01 DIAGNOSIS — N2581 Secondary hyperparathyroidism of renal origin: Secondary | ICD-10-CM | POA: Diagnosis not present

## 2022-02-01 DIAGNOSIS — D631 Anemia in chronic kidney disease: Secondary | ICD-10-CM | POA: Diagnosis not present

## 2022-02-01 DIAGNOSIS — N186 End stage renal disease: Secondary | ICD-10-CM | POA: Diagnosis not present

## 2022-02-01 DIAGNOSIS — Z992 Dependence on renal dialysis: Secondary | ICD-10-CM | POA: Diagnosis not present

## 2022-02-01 NOTE — Telephone Encounter (Signed)
Left message for patient to call back and schedule Medicare Annual Wellness Visit (AWV) either virtually or in office. I left my number for patient to call [phone].   awvi 07/15/19 per [facility]   please schedule at anytime with health coach  This should be a 45 minute visit.

## 2022-02-02 ENCOUNTER — Ambulatory Visit: Payer: Federal, State, Local not specified - PPO | Admitting: Critical Care Medicine

## 2022-02-02 DIAGNOSIS — N186 End stage renal disease: Secondary | ICD-10-CM | POA: Diagnosis not present

## 2022-02-02 DIAGNOSIS — D631 Anemia in chronic kidney disease: Secondary | ICD-10-CM | POA: Diagnosis not present

## 2022-02-02 DIAGNOSIS — N2581 Secondary hyperparathyroidism of renal origin: Secondary | ICD-10-CM | POA: Diagnosis not present

## 2022-02-02 DIAGNOSIS — Z992 Dependence on renal dialysis: Secondary | ICD-10-CM | POA: Diagnosis not present

## 2022-02-03 DIAGNOSIS — Z992 Dependence on renal dialysis: Secondary | ICD-10-CM | POA: Diagnosis not present

## 2022-02-03 DIAGNOSIS — N2581 Secondary hyperparathyroidism of renal origin: Secondary | ICD-10-CM | POA: Diagnosis not present

## 2022-02-03 DIAGNOSIS — D631 Anemia in chronic kidney disease: Secondary | ICD-10-CM | POA: Diagnosis not present

## 2022-02-03 DIAGNOSIS — N186 End stage renal disease: Secondary | ICD-10-CM | POA: Diagnosis not present

## 2022-02-04 ENCOUNTER — Ambulatory Visit: Payer: Medicare Other

## 2022-02-04 DIAGNOSIS — D631 Anemia in chronic kidney disease: Secondary | ICD-10-CM | POA: Diagnosis not present

## 2022-02-04 DIAGNOSIS — Z992 Dependence on renal dialysis: Secondary | ICD-10-CM | POA: Diagnosis not present

## 2022-02-04 DIAGNOSIS — N186 End stage renal disease: Secondary | ICD-10-CM | POA: Diagnosis not present

## 2022-02-04 DIAGNOSIS — N2581 Secondary hyperparathyroidism of renal origin: Secondary | ICD-10-CM | POA: Diagnosis not present

## 2022-02-05 DIAGNOSIS — D631 Anemia in chronic kidney disease: Secondary | ICD-10-CM | POA: Diagnosis not present

## 2022-02-05 DIAGNOSIS — N2581 Secondary hyperparathyroidism of renal origin: Secondary | ICD-10-CM | POA: Diagnosis not present

## 2022-02-05 DIAGNOSIS — N186 End stage renal disease: Secondary | ICD-10-CM | POA: Diagnosis not present

## 2022-02-05 DIAGNOSIS — Z992 Dependence on renal dialysis: Secondary | ICD-10-CM | POA: Diagnosis not present

## 2022-02-06 DIAGNOSIS — N186 End stage renal disease: Secondary | ICD-10-CM | POA: Diagnosis not present

## 2022-02-06 DIAGNOSIS — N2581 Secondary hyperparathyroidism of renal origin: Secondary | ICD-10-CM | POA: Diagnosis not present

## 2022-02-06 DIAGNOSIS — D631 Anemia in chronic kidney disease: Secondary | ICD-10-CM | POA: Diagnosis not present

## 2022-02-06 DIAGNOSIS — Z992 Dependence on renal dialysis: Secondary | ICD-10-CM | POA: Diagnosis not present

## 2022-02-07 ENCOUNTER — Telehealth: Payer: Self-pay

## 2022-02-07 DIAGNOSIS — N186 End stage renal disease: Secondary | ICD-10-CM | POA: Diagnosis not present

## 2022-02-07 DIAGNOSIS — Z992 Dependence on renal dialysis: Secondary | ICD-10-CM | POA: Diagnosis not present

## 2022-02-07 DIAGNOSIS — D631 Anemia in chronic kidney disease: Secondary | ICD-10-CM | POA: Diagnosis not present

## 2022-02-07 DIAGNOSIS — N2581 Secondary hyperparathyroidism of renal origin: Secondary | ICD-10-CM | POA: Diagnosis not present

## 2022-02-07 NOTE — Telephone Encounter (Signed)
Spoke with pt. He verbalizes understanding that colonoscopies are now performed at [facility] for dialysis patients. He will call back when he has his calendar to schedule OV.  PV(10/4) and colonoscopy (10/18) cancelled.

## 2022-02-08 DIAGNOSIS — D631 Anemia in chronic kidney disease: Secondary | ICD-10-CM | POA: Diagnosis not present

## 2022-02-08 DIAGNOSIS — E211 Secondary hyperparathyroidism, not elsewhere classified: Secondary | ICD-10-CM | POA: Diagnosis not present

## 2022-02-08 DIAGNOSIS — Z992 Dependence on renal dialysis: Secondary | ICD-10-CM | POA: Diagnosis not present

## 2022-02-08 DIAGNOSIS — N186 End stage renal disease: Secondary | ICD-10-CM | POA: Diagnosis not present

## 2022-02-08 DIAGNOSIS — Z6841 Body Mass Index (BMI) 40.0 and over, adult: Secondary | ICD-10-CM | POA: Diagnosis not present

## 2022-02-08 DIAGNOSIS — N2581 Secondary hyperparathyroidism of renal origin: Secondary | ICD-10-CM | POA: Diagnosis not present

## 2022-02-09 DIAGNOSIS — N2581 Secondary hyperparathyroidism of renal origin: Secondary | ICD-10-CM | POA: Diagnosis not present

## 2022-02-09 DIAGNOSIS — D631 Anemia in chronic kidney disease: Secondary | ICD-10-CM | POA: Diagnosis not present

## 2022-02-09 DIAGNOSIS — Z992 Dependence on renal dialysis: Secondary | ICD-10-CM | POA: Diagnosis not present

## 2022-02-09 DIAGNOSIS — N186 End stage renal disease: Secondary | ICD-10-CM | POA: Diagnosis not present

## 2022-02-10 DIAGNOSIS — N186 End stage renal disease: Secondary | ICD-10-CM | POA: Diagnosis not present

## 2022-02-10 DIAGNOSIS — D631 Anemia in chronic kidney disease: Secondary | ICD-10-CM | POA: Diagnosis not present

## 2022-02-10 DIAGNOSIS — N2581 Secondary hyperparathyroidism of renal origin: Secondary | ICD-10-CM | POA: Diagnosis not present

## 2022-02-10 DIAGNOSIS — Z992 Dependence on renal dialysis: Secondary | ICD-10-CM | POA: Diagnosis not present

## 2022-02-11 DIAGNOSIS — Z992 Dependence on renal dialysis: Secondary | ICD-10-CM | POA: Diagnosis not present

## 2022-02-11 DIAGNOSIS — N186 End stage renal disease: Secondary | ICD-10-CM | POA: Diagnosis not present

## 2022-02-11 DIAGNOSIS — N2581 Secondary hyperparathyroidism of renal origin: Secondary | ICD-10-CM | POA: Diagnosis not present

## 2022-02-11 DIAGNOSIS — D631 Anemia in chronic kidney disease: Secondary | ICD-10-CM | POA: Diagnosis not present

## 2022-02-12 DIAGNOSIS — Z992 Dependence on renal dialysis: Secondary | ICD-10-CM | POA: Diagnosis not present

## 2022-02-12 DIAGNOSIS — D631 Anemia in chronic kidney disease: Secondary | ICD-10-CM | POA: Diagnosis not present

## 2022-02-12 DIAGNOSIS — N186 End stage renal disease: Secondary | ICD-10-CM | POA: Diagnosis not present

## 2022-02-12 DIAGNOSIS — N2581 Secondary hyperparathyroidism of renal origin: Secondary | ICD-10-CM | POA: Diagnosis not present

## 2022-02-12 DIAGNOSIS — E1122 Type 2 diabetes mellitus with diabetic chronic kidney disease: Secondary | ICD-10-CM | POA: Diagnosis not present

## 2022-02-13 DIAGNOSIS — N186 End stage renal disease: Secondary | ICD-10-CM | POA: Diagnosis not present

## 2022-02-13 DIAGNOSIS — Z4932 Encounter for adequacy testing for peritoneal dialysis: Secondary | ICD-10-CM | POA: Diagnosis not present

## 2022-02-13 DIAGNOSIS — Z992 Dependence on renal dialysis: Secondary | ICD-10-CM | POA: Diagnosis not present

## 2022-02-13 DIAGNOSIS — D631 Anemia in chronic kidney disease: Secondary | ICD-10-CM | POA: Diagnosis not present

## 2022-02-13 DIAGNOSIS — N2581 Secondary hyperparathyroidism of renal origin: Secondary | ICD-10-CM | POA: Diagnosis not present

## 2022-02-14 DIAGNOSIS — Z992 Dependence on renal dialysis: Secondary | ICD-10-CM | POA: Diagnosis not present

## 2022-02-14 DIAGNOSIS — N186 End stage renal disease: Secondary | ICD-10-CM | POA: Diagnosis not present

## 2022-02-14 DIAGNOSIS — D631 Anemia in chronic kidney disease: Secondary | ICD-10-CM | POA: Diagnosis not present

## 2022-02-14 DIAGNOSIS — Z4932 Encounter for adequacy testing for peritoneal dialysis: Secondary | ICD-10-CM | POA: Diagnosis not present

## 2022-02-14 DIAGNOSIS — N2581 Secondary hyperparathyroidism of renal origin: Secondary | ICD-10-CM | POA: Diagnosis not present

## 2022-02-15 DIAGNOSIS — N2581 Secondary hyperparathyroidism of renal origin: Secondary | ICD-10-CM | POA: Diagnosis not present

## 2022-02-15 DIAGNOSIS — Z4932 Encounter for adequacy testing for peritoneal dialysis: Secondary | ICD-10-CM | POA: Diagnosis not present

## 2022-02-15 DIAGNOSIS — D631 Anemia in chronic kidney disease: Secondary | ICD-10-CM | POA: Diagnosis not present

## 2022-02-15 DIAGNOSIS — Z992 Dependence on renal dialysis: Secondary | ICD-10-CM | POA: Diagnosis not present

## 2022-02-15 DIAGNOSIS — N186 End stage renal disease: Secondary | ICD-10-CM | POA: Diagnosis not present

## 2022-02-16 DIAGNOSIS — Z4932 Encounter for adequacy testing for peritoneal dialysis: Secondary | ICD-10-CM | POA: Diagnosis not present

## 2022-02-16 DIAGNOSIS — Z992 Dependence on renal dialysis: Secondary | ICD-10-CM | POA: Diagnosis not present

## 2022-02-16 DIAGNOSIS — D631 Anemia in chronic kidney disease: Secondary | ICD-10-CM | POA: Diagnosis not present

## 2022-02-16 DIAGNOSIS — N2581 Secondary hyperparathyroidism of renal origin: Secondary | ICD-10-CM | POA: Diagnosis not present

## 2022-02-16 DIAGNOSIS — N186 End stage renal disease: Secondary | ICD-10-CM | POA: Diagnosis not present

## 2022-02-17 DIAGNOSIS — N2581 Secondary hyperparathyroidism of renal origin: Secondary | ICD-10-CM | POA: Diagnosis not present

## 2022-02-17 DIAGNOSIS — Z4932 Encounter for adequacy testing for peritoneal dialysis: Secondary | ICD-10-CM | POA: Diagnosis not present

## 2022-02-17 DIAGNOSIS — N186 End stage renal disease: Secondary | ICD-10-CM | POA: Diagnosis not present

## 2022-02-17 DIAGNOSIS — D631 Anemia in chronic kidney disease: Secondary | ICD-10-CM | POA: Diagnosis not present

## 2022-02-17 DIAGNOSIS — Z992 Dependence on renal dialysis: Secondary | ICD-10-CM | POA: Diagnosis not present

## 2022-02-18 DIAGNOSIS — Z4932 Encounter for adequacy testing for peritoneal dialysis: Secondary | ICD-10-CM | POA: Diagnosis not present

## 2022-02-18 DIAGNOSIS — N186 End stage renal disease: Secondary | ICD-10-CM | POA: Diagnosis not present

## 2022-02-18 DIAGNOSIS — Z992 Dependence on renal dialysis: Secondary | ICD-10-CM | POA: Diagnosis not present

## 2022-02-18 DIAGNOSIS — N2581 Secondary hyperparathyroidism of renal origin: Secondary | ICD-10-CM | POA: Diagnosis not present

## 2022-02-18 DIAGNOSIS — D631 Anemia in chronic kidney disease: Secondary | ICD-10-CM | POA: Diagnosis not present

## 2022-02-19 DIAGNOSIS — Z992 Dependence on renal dialysis: Secondary | ICD-10-CM | POA: Diagnosis not present

## 2022-02-19 DIAGNOSIS — Z4932 Encounter for adequacy testing for peritoneal dialysis: Secondary | ICD-10-CM | POA: Diagnosis not present

## 2022-02-19 DIAGNOSIS — N186 End stage renal disease: Secondary | ICD-10-CM | POA: Diagnosis not present

## 2022-02-19 DIAGNOSIS — D631 Anemia in chronic kidney disease: Secondary | ICD-10-CM | POA: Diagnosis not present

## 2022-02-19 DIAGNOSIS — N2581 Secondary hyperparathyroidism of renal origin: Secondary | ICD-10-CM | POA: Diagnosis not present

## 2022-02-20 DIAGNOSIS — N2581 Secondary hyperparathyroidism of renal origin: Secondary | ICD-10-CM | POA: Diagnosis not present

## 2022-02-20 DIAGNOSIS — N186 End stage renal disease: Secondary | ICD-10-CM | POA: Diagnosis not present

## 2022-02-20 DIAGNOSIS — Z992 Dependence on renal dialysis: Secondary | ICD-10-CM | POA: Diagnosis not present

## 2022-02-20 DIAGNOSIS — D631 Anemia in chronic kidney disease: Secondary | ICD-10-CM | POA: Diagnosis not present

## 2022-02-20 DIAGNOSIS — Z4932 Encounter for adequacy testing for peritoneal dialysis: Secondary | ICD-10-CM | POA: Diagnosis not present

## 2022-02-21 ENCOUNTER — Encounter (HOSPITAL_BASED_OUTPATIENT_CLINIC_OR_DEPARTMENT_OTHER): Payer: Self-pay | Admitting: Cardiology

## 2022-02-21 DIAGNOSIS — Z992 Dependence on renal dialysis: Secondary | ICD-10-CM | POA: Diagnosis not present

## 2022-02-21 DIAGNOSIS — N2581 Secondary hyperparathyroidism of renal origin: Secondary | ICD-10-CM | POA: Diagnosis not present

## 2022-02-21 DIAGNOSIS — N186 End stage renal disease: Secondary | ICD-10-CM | POA: Diagnosis not present

## 2022-02-21 DIAGNOSIS — D631 Anemia in chronic kidney disease: Secondary | ICD-10-CM | POA: Diagnosis not present

## 2022-02-21 DIAGNOSIS — Z4932 Encounter for adequacy testing for peritoneal dialysis: Secondary | ICD-10-CM | POA: Diagnosis not present

## 2022-02-22 ENCOUNTER — Encounter: Payer: Self-pay | Admitting: Internal Medicine

## 2022-02-22 DIAGNOSIS — Z992 Dependence on renal dialysis: Secondary | ICD-10-CM | POA: Diagnosis not present

## 2022-02-22 DIAGNOSIS — N2581 Secondary hyperparathyroidism of renal origin: Secondary | ICD-10-CM | POA: Diagnosis not present

## 2022-02-22 DIAGNOSIS — Z4932 Encounter for adequacy testing for peritoneal dialysis: Secondary | ICD-10-CM | POA: Diagnosis not present

## 2022-02-22 DIAGNOSIS — D631 Anemia in chronic kidney disease: Secondary | ICD-10-CM | POA: Diagnosis not present

## 2022-02-22 DIAGNOSIS — N186 End stage renal disease: Secondary | ICD-10-CM | POA: Diagnosis not present

## 2022-02-23 DIAGNOSIS — N186 End stage renal disease: Secondary | ICD-10-CM | POA: Diagnosis not present

## 2022-02-23 DIAGNOSIS — N2581 Secondary hyperparathyroidism of renal origin: Secondary | ICD-10-CM | POA: Diagnosis not present

## 2022-02-23 DIAGNOSIS — D631 Anemia in chronic kidney disease: Secondary | ICD-10-CM | POA: Diagnosis not present

## 2022-02-23 DIAGNOSIS — Z4932 Encounter for adequacy testing for peritoneal dialysis: Secondary | ICD-10-CM | POA: Diagnosis not present

## 2022-02-23 DIAGNOSIS — Z992 Dependence on renal dialysis: Secondary | ICD-10-CM | POA: Diagnosis not present

## 2022-02-24 DIAGNOSIS — D631 Anemia in chronic kidney disease: Secondary | ICD-10-CM | POA: Diagnosis not present

## 2022-02-24 DIAGNOSIS — N186 End stage renal disease: Secondary | ICD-10-CM | POA: Diagnosis not present

## 2022-02-24 DIAGNOSIS — N2581 Secondary hyperparathyroidism of renal origin: Secondary | ICD-10-CM | POA: Diagnosis not present

## 2022-02-24 DIAGNOSIS — Z4932 Encounter for adequacy testing for peritoneal dialysis: Secondary | ICD-10-CM | POA: Diagnosis not present

## 2022-02-24 DIAGNOSIS — Z992 Dependence on renal dialysis: Secondary | ICD-10-CM | POA: Diagnosis not present

## 2022-02-25 DIAGNOSIS — D631 Anemia in chronic kidney disease: Secondary | ICD-10-CM | POA: Diagnosis not present

## 2022-02-25 DIAGNOSIS — Z992 Dependence on renal dialysis: Secondary | ICD-10-CM | POA: Diagnosis not present

## 2022-02-25 DIAGNOSIS — Z4932 Encounter for adequacy testing for peritoneal dialysis: Secondary | ICD-10-CM | POA: Diagnosis not present

## 2022-02-25 DIAGNOSIS — N2581 Secondary hyperparathyroidism of renal origin: Secondary | ICD-10-CM | POA: Diagnosis not present

## 2022-02-25 DIAGNOSIS — N186 End stage renal disease: Secondary | ICD-10-CM | POA: Diagnosis not present

## 2022-02-26 DIAGNOSIS — N2581 Secondary hyperparathyroidism of renal origin: Secondary | ICD-10-CM | POA: Diagnosis not present

## 2022-02-26 DIAGNOSIS — Z992 Dependence on renal dialysis: Secondary | ICD-10-CM | POA: Diagnosis not present

## 2022-02-26 DIAGNOSIS — D631 Anemia in chronic kidney disease: Secondary | ICD-10-CM | POA: Diagnosis not present

## 2022-02-26 DIAGNOSIS — N186 End stage renal disease: Secondary | ICD-10-CM | POA: Diagnosis not present

## 2022-02-26 DIAGNOSIS — Z4932 Encounter for adequacy testing for peritoneal dialysis: Secondary | ICD-10-CM | POA: Diagnosis not present

## 2022-02-27 DIAGNOSIS — N186 End stage renal disease: Secondary | ICD-10-CM | POA: Diagnosis not present

## 2022-02-27 DIAGNOSIS — N2581 Secondary hyperparathyroidism of renal origin: Secondary | ICD-10-CM | POA: Diagnosis not present

## 2022-02-27 DIAGNOSIS — Z4932 Encounter for adequacy testing for peritoneal dialysis: Secondary | ICD-10-CM | POA: Diagnosis not present

## 2022-02-27 DIAGNOSIS — Z992 Dependence on renal dialysis: Secondary | ICD-10-CM | POA: Diagnosis not present

## 2022-02-27 DIAGNOSIS — D631 Anemia in chronic kidney disease: Secondary | ICD-10-CM | POA: Diagnosis not present

## 2022-02-28 ENCOUNTER — Telehealth: Payer: Self-pay | Admitting: Physician Assistant

## 2022-02-28 DIAGNOSIS — D631 Anemia in chronic kidney disease: Secondary | ICD-10-CM | POA: Diagnosis not present

## 2022-02-28 DIAGNOSIS — N186 End stage renal disease: Secondary | ICD-10-CM | POA: Diagnosis not present

## 2022-02-28 DIAGNOSIS — N2581 Secondary hyperparathyroidism of renal origin: Secondary | ICD-10-CM | POA: Diagnosis not present

## 2022-02-28 DIAGNOSIS — Z4932 Encounter for adequacy testing for peritoneal dialysis: Secondary | ICD-10-CM | POA: Diagnosis not present

## 2022-02-28 DIAGNOSIS — Z992 Dependence on renal dialysis: Secondary | ICD-10-CM | POA: Diagnosis not present

## 2022-02-28 NOTE — Telephone Encounter (Signed)
Returned patients call  Patient left message week of 02/21/22 Left message for patient to call back and schedule Medicare Annual Wellness Visit (AWV) either virtually or in office. Left  my Herbie Drape number [phone]   Last AWV please schedule with Nurse Health Adviser   45 min for awv-i and in office appointments 30 min for awv-s  phone/virtual appointments

## 2022-03-01 DIAGNOSIS — Z992 Dependence on renal dialysis: Secondary | ICD-10-CM | POA: Diagnosis not present

## 2022-03-01 DIAGNOSIS — N2581 Secondary hyperparathyroidism of renal origin: Secondary | ICD-10-CM | POA: Diagnosis not present

## 2022-03-01 DIAGNOSIS — D631 Anemia in chronic kidney disease: Secondary | ICD-10-CM | POA: Diagnosis not present

## 2022-03-01 DIAGNOSIS — Z4932 Encounter for adequacy testing for peritoneal dialysis: Secondary | ICD-10-CM | POA: Diagnosis not present

## 2022-03-01 DIAGNOSIS — N186 End stage renal disease: Secondary | ICD-10-CM | POA: Diagnosis not present

## 2022-03-01 NOTE — Telephone Encounter (Signed)
Returned patients call  Patient left message 10/17 @ 1:33

## 2022-03-02 ENCOUNTER — Encounter: Payer: Medicare Other | Admitting: Internal Medicine

## 2022-03-02 ENCOUNTER — Encounter (HOSPITAL_BASED_OUTPATIENT_CLINIC_OR_DEPARTMENT_OTHER): Payer: Self-pay | Admitting: Cardiology

## 2022-03-02 DIAGNOSIS — N2581 Secondary hyperparathyroidism of renal origin: Secondary | ICD-10-CM | POA: Diagnosis not present

## 2022-03-02 DIAGNOSIS — N186 End stage renal disease: Secondary | ICD-10-CM | POA: Diagnosis not present

## 2022-03-02 DIAGNOSIS — D631 Anemia in chronic kidney disease: Secondary | ICD-10-CM | POA: Diagnosis not present

## 2022-03-02 DIAGNOSIS — Z4932 Encounter for adequacy testing for peritoneal dialysis: Secondary | ICD-10-CM | POA: Diagnosis not present

## 2022-03-02 DIAGNOSIS — Z992 Dependence on renal dialysis: Secondary | ICD-10-CM | POA: Diagnosis not present

## 2022-03-03 DIAGNOSIS — N186 End stage renal disease: Secondary | ICD-10-CM | POA: Diagnosis not present

## 2022-03-03 DIAGNOSIS — D631 Anemia in chronic kidney disease: Secondary | ICD-10-CM | POA: Diagnosis not present

## 2022-03-03 DIAGNOSIS — Z992 Dependence on renal dialysis: Secondary | ICD-10-CM | POA: Diagnosis not present

## 2022-03-03 DIAGNOSIS — N2581 Secondary hyperparathyroidism of renal origin: Secondary | ICD-10-CM | POA: Diagnosis not present

## 2022-03-03 DIAGNOSIS — Z4932 Encounter for adequacy testing for peritoneal dialysis: Secondary | ICD-10-CM | POA: Diagnosis not present

## 2022-03-04 ENCOUNTER — Telehealth: Payer: Self-pay | Admitting: *Deleted

## 2022-03-04 DIAGNOSIS — N186 End stage renal disease: Secondary | ICD-10-CM | POA: Diagnosis not present

## 2022-03-04 DIAGNOSIS — G4733 Obstructive sleep apnea (adult) (pediatric): Secondary | ICD-10-CM

## 2022-03-04 DIAGNOSIS — Z992 Dependence on renal dialysis: Secondary | ICD-10-CM | POA: Diagnosis not present

## 2022-03-04 DIAGNOSIS — Z4932 Encounter for adequacy testing for peritoneal dialysis: Secondary | ICD-10-CM | POA: Diagnosis not present

## 2022-03-04 DIAGNOSIS — D631 Anemia in chronic kidney disease: Secondary | ICD-10-CM | POA: Diagnosis not present

## 2022-03-04 DIAGNOSIS — N2581 Secondary hyperparathyroidism of renal origin: Secondary | ICD-10-CM | POA: Diagnosis not present

## 2022-03-04 NOTE — Telephone Encounter (Signed)
Supplies order sent to [facility] and patient records per patient request.

## 2022-03-05 DIAGNOSIS — D631 Anemia in chronic kidney disease: Secondary | ICD-10-CM | POA: Diagnosis not present

## 2022-03-05 DIAGNOSIS — N2581 Secondary hyperparathyroidism of renal origin: Secondary | ICD-10-CM | POA: Diagnosis not present

## 2022-03-05 DIAGNOSIS — N186 End stage renal disease: Secondary | ICD-10-CM | POA: Diagnosis not present

## 2022-03-05 DIAGNOSIS — Z4932 Encounter for adequacy testing for peritoneal dialysis: Secondary | ICD-10-CM | POA: Diagnosis not present

## 2022-03-05 DIAGNOSIS — Z992 Dependence on renal dialysis: Secondary | ICD-10-CM | POA: Diagnosis not present

## 2022-03-06 DIAGNOSIS — Z992 Dependence on renal dialysis: Secondary | ICD-10-CM | POA: Diagnosis not present

## 2022-03-06 DIAGNOSIS — Z4932 Encounter for adequacy testing for peritoneal dialysis: Secondary | ICD-10-CM | POA: Diagnosis not present

## 2022-03-06 DIAGNOSIS — N186 End stage renal disease: Secondary | ICD-10-CM | POA: Diagnosis not present

## 2022-03-06 DIAGNOSIS — D631 Anemia in chronic kidney disease: Secondary | ICD-10-CM | POA: Diagnosis not present

## 2022-03-06 DIAGNOSIS — N2581 Secondary hyperparathyroidism of renal origin: Secondary | ICD-10-CM | POA: Diagnosis not present

## 2022-03-07 DIAGNOSIS — N2581 Secondary hyperparathyroidism of renal origin: Secondary | ICD-10-CM | POA: Diagnosis not present

## 2022-03-07 DIAGNOSIS — D631 Anemia in chronic kidney disease: Secondary | ICD-10-CM | POA: Diagnosis not present

## 2022-03-07 DIAGNOSIS — Z4932 Encounter for adequacy testing for peritoneal dialysis: Secondary | ICD-10-CM | POA: Diagnosis not present

## 2022-03-07 DIAGNOSIS — N186 End stage renal disease: Secondary | ICD-10-CM | POA: Diagnosis not present

## 2022-03-07 DIAGNOSIS — Z992 Dependence on renal dialysis: Secondary | ICD-10-CM | POA: Diagnosis not present

## 2022-03-08 ENCOUNTER — Encounter: Payer: Medicare Other | Admitting: Family Medicine

## 2022-03-08 ENCOUNTER — Encounter: Payer: Self-pay | Admitting: Internal Medicine

## 2022-03-08 DIAGNOSIS — D631 Anemia in chronic kidney disease: Secondary | ICD-10-CM | POA: Diagnosis not present

## 2022-03-08 DIAGNOSIS — Z992 Dependence on renal dialysis: Secondary | ICD-10-CM | POA: Diagnosis not present

## 2022-03-08 DIAGNOSIS — N2581 Secondary hyperparathyroidism of renal origin: Secondary | ICD-10-CM | POA: Diagnosis not present

## 2022-03-08 DIAGNOSIS — N186 End stage renal disease: Secondary | ICD-10-CM | POA: Diagnosis not present

## 2022-03-08 DIAGNOSIS — Z4932 Encounter for adequacy testing for peritoneal dialysis: Secondary | ICD-10-CM | POA: Diagnosis not present

## 2022-03-09 DIAGNOSIS — Z992 Dependence on renal dialysis: Secondary | ICD-10-CM | POA: Diagnosis not present

## 2022-03-09 DIAGNOSIS — Z4932 Encounter for adequacy testing for peritoneal dialysis: Secondary | ICD-10-CM | POA: Diagnosis not present

## 2022-03-09 DIAGNOSIS — N186 End stage renal disease: Secondary | ICD-10-CM | POA: Diagnosis not present

## 2022-03-09 DIAGNOSIS — D631 Anemia in chronic kidney disease: Secondary | ICD-10-CM | POA: Diagnosis not present

## 2022-03-09 DIAGNOSIS — N2581 Secondary hyperparathyroidism of renal origin: Secondary | ICD-10-CM | POA: Diagnosis not present

## 2022-03-10 DIAGNOSIS — Z992 Dependence on renal dialysis: Secondary | ICD-10-CM | POA: Diagnosis not present

## 2022-03-10 DIAGNOSIS — N2581 Secondary hyperparathyroidism of renal origin: Secondary | ICD-10-CM | POA: Diagnosis not present

## 2022-03-10 DIAGNOSIS — Z4932 Encounter for adequacy testing for peritoneal dialysis: Secondary | ICD-10-CM | POA: Diagnosis not present

## 2022-03-10 DIAGNOSIS — N186 End stage renal disease: Secondary | ICD-10-CM | POA: Diagnosis not present

## 2022-03-10 DIAGNOSIS — G4733 Obstructive sleep apnea (adult) (pediatric): Secondary | ICD-10-CM | POA: Diagnosis not present

## 2022-03-10 DIAGNOSIS — D631 Anemia in chronic kidney disease: Secondary | ICD-10-CM | POA: Diagnosis not present

## 2022-03-11 ENCOUNTER — Telehealth: Payer: Self-pay

## 2022-03-11 ENCOUNTER — Ambulatory Visit (INDEPENDENT_AMBULATORY_CARE_PROVIDER_SITE_OTHER): Payer: Medicare Other

## 2022-03-11 VITALS — Ht 67.0 in | Wt 268.0 lb

## 2022-03-11 DIAGNOSIS — Z4932 Encounter for adequacy testing for peritoneal dialysis: Secondary | ICD-10-CM | POA: Diagnosis not present

## 2022-03-11 DIAGNOSIS — Z0000 Encounter for general adult medical examination without abnormal findings: Secondary | ICD-10-CM | POA: Diagnosis not present

## 2022-03-11 DIAGNOSIS — N186 End stage renal disease: Secondary | ICD-10-CM | POA: Diagnosis not present

## 2022-03-11 DIAGNOSIS — N2581 Secondary hyperparathyroidism of renal origin: Secondary | ICD-10-CM | POA: Diagnosis not present

## 2022-03-11 DIAGNOSIS — Z992 Dependence on renal dialysis: Secondary | ICD-10-CM | POA: Diagnosis not present

## 2022-03-11 DIAGNOSIS — D631 Anemia in chronic kidney disease: Secondary | ICD-10-CM | POA: Diagnosis not present

## 2022-03-11 NOTE — Telephone Encounter (Signed)
This nurse has attempted numerous times to call patient for AWV. It rings once and then goes to voicemail. He called the office and stated to the front desk that someone was calling him. When I attempted to call back call just continues to ring once then goes to voicemail. I left him a message with my jabber number.

## 2022-03-11 NOTE — Progress Notes (Signed)
I connected with Adrian Frye today by telephone and verified that I am speaking with the correct person using two identifiers. Location patient: home Location provider: work Persons participating in the virtual visit: Adrian Frye, Glenna Durand LPN.   I discussed the limitations, risks, security and privacy concerns of performing an evaluation and management service by telephone and the availability of in person appointments. I also discussed with the patient that there may be a patient responsible charge related to this service. The patient expressed understanding and verbally consented to this telephonic visit.    Interactive audio and video telecommunications were attempted between this provider and patient, however failed, due to patient having technical difficulties OR patient did not have access to video capability.  We continued and completed visit with audio only.     Vital signs may be patient reported or missing.  Subjective:   Adrian Frye is a 51 y.o. male who presents for an Initial Medicare Annual Wellness Visit.  Review of Systems     Cardiac Risk Factors include: diabetes mellitus;dyslipidemia;hypertension;male gender;obesity (BMI >30kg/m2)     Objective:    Today's Vitals   03/11/22 1111  Weight: 268 lb (121.6 kg)  Height: '5\' 7"'$  (1.702 m)   Body mass index is 41.97 kg/m.     03/11/2022   11:15 AM 01/22/2020   12:00 PM 11/02/2017    3:03 PM 07/01/2016    7:46 PM  Advanced Directives  Does Patient Have a Medical Advance Directive? Yes No No No  Type of Paramedic of [place];Living will     Copy of [place] in Chart? No - copy requested     Would patient like information on creating a medical advance directive?  No - Patient declined No - Patient declined No - Patient declined    Current Medications (verified) Outpatient Encounter Medications as of 03/11/2022  Medication Sig   amLODipine (NORVASC)  2.5 MG tablet Take 1 tablet (2.5 mg total) by mouth daily.   AURYXIA 1 GM 210 MG(Fe) tablet Take 420 mg by mouth 3 (three) times daily.   B Complex-C-Folic Acid (DIALYVITE 175 PO) Take 1 tablet by mouth daily.   calcitRIOL (ROCALTROL) 0.5 MCG capsule Take 0.5 mcg by mouth daily.   gentamicin cream (GARAMYCIN) 0.1 % Apply 1 application topically as directed. Port site   Methoxy PEG-Epoetin Beta (MIRCERA IJ) Inject into the skin.   albuterol (VENTOLIN HFA) 108 (90 Base) MCG/ACT inhaler TAKE 2 PUFFS BY MOUTH EVERY 6 HOURS AS NEEDED FOR WHEEZE OR SHORTNESS OF BREATH (Patient not taking: Reported on 10/21/2020)   Semaglutide,0.25 or 0.'5MG'$ /DOS, (OZEMPIC, 0.25 OR 0.5 MG/DOSE,) 2 MG/3ML SOPN    sildenafil (REVATIO) 20 MG tablet Take 1 tablet (20 mg total) by mouth 3 (three) times daily. (Patient not taking: Reported on 03/11/2022)   zolpidem (AMBIEN CR) 12.5 MG CR tablet TAKE 1 TABLET BY MOUTH AT BEDTIME AS NEEDED FOR SLEEP   No facility-administered encounter medications on file as of 03/11/2022.    Allergies (verified) Lisinopril and Losartan   History: Past Medical History:  Diagnosis Date   Allergic rhinitis    Allergy    Anemia    Asthma    as a child   Bacteremia    Benign essential hypertension    Dilated idiopathic cardiomyopathy ([place])    now resoved with EF 55% by echo 2011   Edema    Encounter for blood transfusion    Endocarditis  ESRD (end stage renal disease) ([place])    ESRD on peritoneal dialysis ([place])    Heart disease    Hyperlipidemia    Hyperparathyroidism, secondary ([place])    Hypertension    Morbid obesity ([place])    Sleep apnea    c-pap   UGI bleed 2011   ASA   Past Surgical History:  Procedure Laterality Date   ACHILLES TENDON REPAIR     ruptured; right   AV FISTULA PLACEMENT Left 11/03/2017   Procedure: ARTERIOVENOUS (AV) FISTULA CREATION  left upper arm;  Surgeon: Rosetta Posner, MD;  Location: [facility];  Service: Vascular;  Laterality: Left;   DIALYSIS FISTULA  CREATION     INSERTION OF DIALYSIS CATHETER N/A 11/03/2017   Procedure: INSERTION OF DIALYSIS CATHETER - RIGHT INTERNAL JUGULAR PLACEMENT;  Surgeon: Rosetta Posner, MD;  Location: [facility] OR;  Service: Vascular;  Laterality: N/A;   IR FLUORO GUIDE CV LINE LEFT  01/24/2020   IR REMOVAL TUN CV CATH W/O FL  01/21/2020   IR REMOVAL TUN CV CATH W/O FL  03/06/2020   IR US GUIDE VASC ACCESS LEFT  01/24/2020   KIDNEY FAILURE     LAPAROSCOPIC GASTROTOMY W/ REPAIR OF ULCER     PLEURAL SCARIFICATION     left, football trauma-chest tube   STOMACH SURGERY     TEE WITHOUT CARDIOVERSION N/A 01/22/2020   Procedure: TRANSESOPHAGEAL ECHOCARDIOGRAM (TEE);  Surgeon: Pixie Casino, MD;  Location: [facility] ENDOSCOPY;  Service: Cardiovascular;  Laterality: N/A;   TONSILLECTOMY     TONSILLECTOMY     UPPER GASTROINTESTINAL ENDOSCOPY     Family History  Problem Relation Age of Onset   Diabetes Mother    Heart failure Mother    Hypertension Mother    Heart disease Father    Hypertension Brother    Hypertension Brother    Colon cancer Neg Hx    Esophageal cancer Neg Hx    Rectal cancer Neg Hx    Stomach cancer Neg Hx    Social History   Socioeconomic History   Marital status: Married    Spouse name: Not on file   Number of children: 2   Years of education: Not on file   Highest education level: Not on file  Occupational History   Occupation: real Investment banker, corporate  Tobacco Use   Smoking status: Former    Years: 6.00    Types: Cigarettes    Quit date: 05/16/2009    Years since quitting: 12.8   Smokeless tobacco: Never   Tobacco comments:    smoked 1 pack per week   Vaping Use   Vaping Use: Never used  Substance and Sexual Activity   Alcohol use: Not Currently    Alcohol/week: 2.0 standard drinks of alcohol    Types: 2 Cans of beer per week    Comment: occasionally   Drug use: No   Sexual activity: Yes  Other Topics Concern   Not on file  Social History Narrative   Not on file   Social Determinants of  Health   Financial Resource Strain: Low Risk  (03/11/2022)   Overall Financial Resource Strain (CARDIA)    Difficulty of Paying Living Expenses: Not hard at all  Food Insecurity: No Food Insecurity (03/11/2022)   Hunger Vital Sign    Worried About Running Out of Food in the Last Year: Never true    Ran Out of Food in the Last Year: Never true  Transportation Needs: No Transportation Needs (  03/11/2022)   PRAPARE - Hydrologist (Medical): No    Lack of Transportation (Non-Medical): No  Physical Activity: Sufficiently Active (03/11/2022)   Exercise Vital Sign    Days of Exercise per Week: 3 days    Minutes of Exercise per Session: 60 min  Stress: No Stress Concern Present (03/11/2022)   [place]    Feeling of Stress : Not at all  Social Connections: Not on file    Tobacco Counseling Counseling given: Not Answered Tobacco comments: smoked 1 pack per week    Clinical Intake:  Pre-visit preparation completed: Yes  Pain : No/denies pain     Nutritional Status: BMI > 30  Obese Nutritional Risks: None Diabetes: Yes  How often do you need to have someone help you when you read instructions, pamphlets, or other written materials from your doctor or pharmacy?: 1 - Never What is the last grade level you completed in school?: BA degree  Diabetic? no    Interpreter Needed?: No  Information entered by :: NAllen LPN   Activities of Daily Living    03/11/2022   11:17 AM 03/07/2022    9:49 AM  In your present state of health, do you have any difficulty performing the following activities:  Hearing? 1 1  Comment decreased hearing in right ear   Vision? 1 0  Comment has some trouble with focus at times   Difficulty concentrating or making decisions? 0 0  Walking or climbing stairs? 0 0  Dressing or bathing? 0 0  Doing errands, shopping? 0 0  Preparing Food and eating ? N N   Using the Toilet? N N  In the past six months, have you accidently leaked urine? N N  Do you have problems with loss of bowel control? N N  Managing your Medications? N N  Managing your Finances? N N  Housekeeping or managing your Housekeeping? N N    Patient Care Team: Denita Lung, MD as PCP - General ([place] any recent Medical Services you may have received from other than [facility] providers in the past year (date may be approximate).     Assessment:   This is a routine wellness examination for [place].  Hearing/Vision screen Vision Screening - Comments:: Regular eye exams, [facility]  Dietary issues and exercise activities discussed: Current Exercise Habits: Home exercise routine, Type of exercise: treadmill;Other - see comments (rowing machine), Time (Minutes): 60, Frequency (Times/Week): 3, Weekly Exercise (Minutes/Week): 180   Goals Addressed             This Visit's Progress    Patient Stated       03/11/2022, wants to lose weight       Depression Screen    03/11/2022   11:16 AM 02/24/2020    3:15 PM 10/21/2019    2:02 PM  PHQ 2/9 Scores  PHQ - 2 Score 0 0 0  PHQ- 9 Score 0      Fall Risk    03/11/2022   11:16 AM 02/24/2020    3:15 PM 10/21/2019    2:02 PM  [place] in the past year? 0 0 0  Number falls in past yr: 0  0  Injury with Fall? 0  0  Risk for fall due to : Medication side effect No Fall Risks   Follow up Falls prevention discussed;Education provided;Falls evaluation completed  Falls evaluation completed     FALL RISK PREVENTION PERTAINING TO THE HOME:  Any stairs in or around the home? Yes  If so, are there any without handrails? No  Home free of loose throw rugs in walkways, pet beds, electrical cords, etc? Yes  Adequate lighting in your home to reduce risk of falls? Yes   ASSISTIVE DEVICES UTILIZED TO PREVENT FALLS:  Life alert? No  Use of a cane, walker or w/c? No   Grab bars in the bathroom? No  Shower chair or bench in shower? Yes  Elevated toilet seat or a handicapped toilet? Yes   TIMED UP AND GO:  Was the test performed? No .       Cognitive Function:        03/11/2022   11:18 AM  6CIT Screen  What Year? 0 points  What month? 0 points  What time? 0 points  Count back from 20 0 points  Months in reverse 0 points  Repeat phrase 2 points  Total Score 2 points    Immunizations Immunization History  Administered Date(s) Administered   Hepatitis B, adult 11/23/2017, 12/28/2017, 02/01/2018, 05/30/2018   Influenza Split 03/01/2009, 03/02/2010, 03/04/2011   Moderna Sars-Covid-2 Vaccination 06/28/2019, 07/26/2019   Pneumococcal Polysaccharide-23 12/02/2013   Td 09/29/2009    TDAP status: Up to date  Flu Vaccine status: Due, Education has been provided regarding the importance of this vaccine. Advised may receive this vaccine at local pharmacy or Health Dept. Aware to provide a copy of the vaccination record if obtained from local pharmacy or Health Dept. Verbalized acceptance and understanding.  Pneumococcal vaccine status: Up to date  Covid-19 vaccine status: Completed vaccines  Qualifies for Shingles Vaccine? Yes   Zostavax completed No   Shingrix Completed?: No.    Education has been provided regarding the importance of this vaccine. Patient has been advised to call insurance company to determine out of pocket expense if they have not yet received this vaccine. Advised may also receive vaccine at local pharmacy or Health Dept. Verbalized acceptance and understanding.  Screening Tests Health Maintenance  Topic Date Due   Medicare Annual Wellness (AWV)  Never done   FOOT EXAM  Never done   OPHTHALMOLOGY EXAM  Never done   HIV Screening  Never done   Hepatitis C Screening  Never done   HEMOGLOBIN A1C  05/04/2018   COVID-19 Vaccine (3 - Moderna series) 09/20/2019   TETANUS/TDAP  09/30/2019   Zoster Vaccines- Shingrix (1 of  2) Never done   INFLUENZA VACCINE  12/14/2021   COLONOSCOPY (Pts 45-15yr Insurance coverage will need to be confirmed)  03/11/2022   HPV VACCINES  Aged Out    Health Maintenance  Health Maintenance Due  Topic Date Due   Medicare Annual Wellness (AWV)  Never done   FOOT EXAM  Never done   OPHTHALMOLOGY EXAM  Never done   HIV Screening  Never done   Hepatitis C Screening  Never done   HEMOGLOBIN A1C  05/04/2018   COVID-19 Vaccine (3 - Moderna series) 09/20/2019   TETANUS/TDAP  09/30/2019   Zoster Vaccines- Shingrix (1 of 2) Never done   INFLUENZA VACCINE  12/14/2021   COLONOSCOPY (Pts 45-428yrInsurance coverage will need to be confirmed)  03/11/2022    Colorectal cancer screening: Type of screening: Colonoscopy. Completed 03/12/2019. Repeat every 3 years  Lung Cancer Screening: (Low Dose CT Chest recommended if Age 51-80ears, 30 pack-year currently smoking OR have quit w/in 15years.) does  not qualify.   Lung Cancer Screening Referral: no  Additional Screening:  Hepatitis C Screening: does qualify;   Vision Screening: Recommended annual ophthalmology exams for early detection of glaucoma and other disorders of the eye. Is the patient up to date with their annual eye exam?  Yes  Who is the provider or what is the name of the office in which the patient attends annual eye exams? [facility] If pt is not established with a provider, would they like to be referred to a provider to establish care? No .   Dental Screening: Recommended annual dental exams for proper oral hygiene  Community Resource Referral / Chronic Care Management: CRR required this visit?  No   CCM required this visit?  No      Plan:     I have personally reviewed and noted the following in the patient's chart:   Medical and social history Use of alcohol, tobacco or illicit drugs  Current medications and supplements including opioid prescriptions. Patient is not currently taking opioid  prescriptions. Functional ability and status Nutritional status Physical activity Advanced directives List of other physicians Hospitalizations, surgeries, and ER visits in previous 12 months Vitals Screenings to include cognitive, depression, and falls Referrals and appointments  In addition, I have reviewed and discussed with patient certain preventive protocols, quality metrics, and best practice recommendations. A written personalized care plan for preventive services as well as general preventive health recommendations were provided to patient.     Kellie Simmering, LPN   [id number]   Nurse Notes: none  Due to this being a virtual visit, the after visit summary with patients personalized plan was offered to patient via mail or my-chart.  Patient would like to access on my-chart

## 2022-03-11 NOTE — Patient Instructions (Signed)
Adrian Frye , Thank you for taking time to come for your Medicare Wellness Visit. I appreciate your ongoing commitment to your health goals. Please review the following plan we discussed and let me know if I can assist you in the future.   Screening recommendations/referrals: Colonoscopy: completed 03/12/2019, due Recommended yearly ophthalmology/optometry visit for glaucoma screening and checkup Recommended yearly dental visit for hygiene and checkup  Vaccinations: Influenza vaccine: due Pneumococcal vaccine: n/a Tdap vaccine: completed per patient Shingles vaccine: discussed   Covid-19:  07/26/2019, 06/28/2019  Advanced directives: Please bring a copy of your POA (Power of Attorney) and/or Living Will to your next appointment.   Conditions/risks identified: none  Next appointment: Follow up in one year for your annual wellness visit   Preventive Care 40-64 Years, Male Preventive care refers to lifestyle choices and visits with your health care provider that can promote health and wellness. What does preventive care include? A yearly physical exam. This is also called an annual well check. Dental exams once or twice a year. Routine eye exams. Ask your health care provider how often you should have your eyes checked. Personal lifestyle choices, including: Daily care of your teeth and gums. Regular physical activity. Eating a healthy diet. Avoiding tobacco and drug use. Limiting alcohol use. Practicing safe sex. Taking low-dose aspirin every day starting at age 21. What happens during an annual well check? The services and screenings done by your health care provider during your annual well check will depend on your age, overall health, lifestyle risk factors, and family history of disease. Counseling  Your health care provider may ask you questions about your: Alcohol use. Tobacco use. Drug use. Emotional well-being. Home and relationship well-being. Sexual activity. Eating  habits. Work and work Statistician. Screening  You may have the following tests or measurements: Height, weight, and BMI. Blood pressure. Lipid and cholesterol levels. These may be checked every 5 years, or more frequently if you are over 60 years old. Skin check. Lung cancer screening. You may have this screening every year starting at age 8 if you have a 30-pack-year history of smoking and currently smoke or have quit within the past 15 years. Fecal occult blood test (FOBT) of the stool. You may have this test every year starting at age 25. Flexible sigmoidoscopy or colonoscopy. You may have a sigmoidoscopy every 5 years or a colonoscopy every 10 years starting at age 20. Prostate cancer screening. Recommendations will vary depending on your family history and other risks. Hepatitis C blood test. Hepatitis B blood test. Sexually transmitted disease (STD) testing. Diabetes screening. This is done by checking your blood sugar (glucose) after you have not eaten for a while (fasting). You may have this done every 1-3 years. Discuss your test results, treatment options, and if necessary, the need for more tests with your health care provider. Vaccines  Your health care provider may recommend certain vaccines, such as: Influenza vaccine. This is recommended every year. Tetanus, diphtheria, and acellular pertussis (Tdap, Td) vaccine. You may need a Td booster every 10 years. Zoster vaccine. You may need this after age 54. Pneumococcal 13-valent conjugate (PCV13) vaccine. You may need this if you have certain conditions and have not been vaccinated. Pneumococcal polysaccharide (PPSV23) vaccine. You may need one or two doses if you smoke cigarettes or if you have certain conditions. Talk to your health care provider about which screenings and vaccines you need and how often you need them. This information is not intended to replace  advice given to you by your health care provider. Make sure you  discuss any questions you have with your health care provider. Document Released: 05/29/2015 Document Revised: 01/20/2016 Document Reviewed: 03/03/2015 Elsevier Interactive Patient Education  2017 [place] Prevention in the Home Falls can cause injuries. They can happen to people of all ages. There are many things you can do to make your home safe and to help prevent falls. What can I do on the outside of my home? Regularly fix the edges of walkways and driveways and fix any cracks. Remove anything that might make you trip as you walk through a door, such as a raised step or threshold. Trim any bushes or trees on the path to your home. Use bright outdoor lighting. Clear any walking paths of anything that might make someone trip, such as rocks or tools. Regularly check to see if handrails are loose or broken. Make sure that both sides of any steps have handrails. Any raised decks and porches should have guardrails on the edges. Have any leaves, snow, or ice cleared regularly. Use sand or salt on walking paths during winter. Clean up any spills in your garage right away. This includes oil or grease spills. What can I do in the bathroom? Use night lights. Install grab bars by the toilet and in the tub and shower. Do not use towel bars as grab bars. Use non-skid mats or decals in the tub or shower. If you need to sit down in the shower, use a plastic, non-slip stool. Keep the floor dry. Clean up any water that spills on the floor as soon as it happens. Remove soap buildup in the tub or shower regularly. Attach bath mats securely with double-sided non-slip rug tape. Do not have throw rugs and other things on the floor that can make you trip. What can I do in the bedroom? Use night lights. Make sure that you have a light by your bed that is easy to reach. Do not use any sheets or blankets that are too big for your bed. They should not hang down onto the floor. Have a firm chair  that has side arms. You can use this for support while you get dressed. Do not have throw rugs and other things on the floor that can make you trip. What can I do in the kitchen? Clean up any spills right away. Avoid walking on wet floors. Keep items that you use a lot in easy-to-reach places. If you need to reach something above you, use a strong step stool that has a grab bar. Keep electrical cords out of the way. Do not use floor polish or wax that makes floors slippery. If you must use wax, use non-skid floor wax. Do not have throw rugs and other things on the floor that can make you trip. What can I do with my stairs? Do not leave any items on the stairs. Make sure that there are handrails on both sides of the stairs and use them. Fix handrails that are broken or loose. Make sure that handrails are as long as the stairways. Check any carpeting to make sure that it is firmly attached to the stairs. Fix any carpet that is loose or worn. Avoid having throw rugs at the top or bottom of the stairs. If you do have throw rugs, attach them to the floor with carpet tape. Make sure that you have a light switch at the top of the stairs and the bottom  of the stairs. If you do not have them, ask someone to add them for you. What else can I do to help prevent falls? Wear shoes that: Do not have high heels. Have rubber bottoms. Are comfortable and fit you well. Are closed at the toe. Do not wear sandals. If you use a stepladder: Make sure that it is fully opened. Do not climb a closed stepladder. Make sure that both sides of the stepladder are locked into place. Ask someone to hold it for you, if possible. Clearly mark and make sure that you can see: Any grab bars or handrails. First and last steps. Where the edge of each step is. Use tools that help you move around (mobility aids) if they are needed. These include: Canes. Walkers. Scooters. Crutches. Turn on the lights when you go into a  dark area. Replace any light bulbs as soon as they burn out. Set up your furniture so you have a clear path. Avoid moving your furniture around. If any of your floors are uneven, fix them. If there are any pets around you, be aware of where they are. Review your medicines with your doctor. Some medicines can make you feel dizzy. This can increase your chance of falling. Ask your doctor what other things that you can do to help prevent falls. This information is not intended to replace advice given to you by your health care provider. Make sure you discuss any questions you have with your health care provider. Document Released: 02/26/2009 Document Revised: 10/08/2015 Document Reviewed: 06/06/2014 Elsevier Interactive Patient Education  2017 [employer].

## 2022-03-12 DIAGNOSIS — Z4932 Encounter for adequacy testing for peritoneal dialysis: Secondary | ICD-10-CM | POA: Diagnosis not present

## 2022-03-12 DIAGNOSIS — D631 Anemia in chronic kidney disease: Secondary | ICD-10-CM | POA: Diagnosis not present

## 2022-03-12 DIAGNOSIS — N186 End stage renal disease: Secondary | ICD-10-CM | POA: Diagnosis not present

## 2022-03-12 DIAGNOSIS — N2581 Secondary hyperparathyroidism of renal origin: Secondary | ICD-10-CM | POA: Diagnosis not present

## 2022-03-12 DIAGNOSIS — Z992 Dependence on renal dialysis: Secondary | ICD-10-CM | POA: Diagnosis not present

## 2022-03-13 DIAGNOSIS — N2581 Secondary hyperparathyroidism of renal origin: Secondary | ICD-10-CM | POA: Diagnosis not present

## 2022-03-13 DIAGNOSIS — Z992 Dependence on renal dialysis: Secondary | ICD-10-CM | POA: Diagnosis not present

## 2022-03-13 DIAGNOSIS — Z4932 Encounter for adequacy testing for peritoneal dialysis: Secondary | ICD-10-CM | POA: Diagnosis not present

## 2022-03-13 DIAGNOSIS — D631 Anemia in chronic kidney disease: Secondary | ICD-10-CM | POA: Diagnosis not present

## 2022-03-13 DIAGNOSIS — N186 End stage renal disease: Secondary | ICD-10-CM | POA: Diagnosis not present

## 2022-03-14 DIAGNOSIS — Z4932 Encounter for adequacy testing for peritoneal dialysis: Secondary | ICD-10-CM | POA: Diagnosis not present

## 2022-03-14 DIAGNOSIS — Z992 Dependence on renal dialysis: Secondary | ICD-10-CM | POA: Diagnosis not present

## 2022-03-14 DIAGNOSIS — N2581 Secondary hyperparathyroidism of renal origin: Secondary | ICD-10-CM | POA: Diagnosis not present

## 2022-03-14 DIAGNOSIS — N186 End stage renal disease: Secondary | ICD-10-CM | POA: Diagnosis not present

## 2022-03-14 DIAGNOSIS — D631 Anemia in chronic kidney disease: Secondary | ICD-10-CM | POA: Diagnosis not present

## 2022-03-15 DIAGNOSIS — Z4932 Encounter for adequacy testing for peritoneal dialysis: Secondary | ICD-10-CM | POA: Diagnosis not present

## 2022-03-15 DIAGNOSIS — N186 End stage renal disease: Secondary | ICD-10-CM | POA: Diagnosis not present

## 2022-03-15 DIAGNOSIS — E1122 Type 2 diabetes mellitus with diabetic chronic kidney disease: Secondary | ICD-10-CM | POA: Diagnosis not present

## 2022-03-15 DIAGNOSIS — Z992 Dependence on renal dialysis: Secondary | ICD-10-CM | POA: Diagnosis not present

## 2022-03-15 DIAGNOSIS — D631 Anemia in chronic kidney disease: Secondary | ICD-10-CM | POA: Diagnosis not present

## 2022-03-15 DIAGNOSIS — N2581 Secondary hyperparathyroidism of renal origin: Secondary | ICD-10-CM | POA: Diagnosis not present

## 2022-03-16 DIAGNOSIS — N2581 Secondary hyperparathyroidism of renal origin: Secondary | ICD-10-CM | POA: Diagnosis not present

## 2022-03-16 DIAGNOSIS — E1122 Type 2 diabetes mellitus with diabetic chronic kidney disease: Secondary | ICD-10-CM | POA: Diagnosis not present

## 2022-03-16 DIAGNOSIS — E785 Hyperlipidemia, unspecified: Secondary | ICD-10-CM | POA: Diagnosis not present

## 2022-03-16 DIAGNOSIS — N186 End stage renal disease: Secondary | ICD-10-CM | POA: Diagnosis not present

## 2022-03-16 DIAGNOSIS — D631 Anemia in chronic kidney disease: Secondary | ICD-10-CM | POA: Diagnosis not present

## 2022-03-16 DIAGNOSIS — Z992 Dependence on renal dialysis: Secondary | ICD-10-CM | POA: Diagnosis not present

## 2022-03-17 DIAGNOSIS — D631 Anemia in chronic kidney disease: Secondary | ICD-10-CM | POA: Diagnosis not present

## 2022-03-17 DIAGNOSIS — N2581 Secondary hyperparathyroidism of renal origin: Secondary | ICD-10-CM | POA: Diagnosis not present

## 2022-03-17 DIAGNOSIS — Z992 Dependence on renal dialysis: Secondary | ICD-10-CM | POA: Diagnosis not present

## 2022-03-17 DIAGNOSIS — N186 End stage renal disease: Secondary | ICD-10-CM | POA: Diagnosis not present

## 2022-03-18 DIAGNOSIS — N186 End stage renal disease: Secondary | ICD-10-CM | POA: Diagnosis not present

## 2022-03-18 DIAGNOSIS — Z992 Dependence on renal dialysis: Secondary | ICD-10-CM | POA: Diagnosis not present

## 2022-03-18 DIAGNOSIS — N2581 Secondary hyperparathyroidism of renal origin: Secondary | ICD-10-CM | POA: Diagnosis not present

## 2022-03-18 DIAGNOSIS — D631 Anemia in chronic kidney disease: Secondary | ICD-10-CM | POA: Diagnosis not present

## 2022-03-19 DIAGNOSIS — D631 Anemia in chronic kidney disease: Secondary | ICD-10-CM | POA: Diagnosis not present

## 2022-03-19 DIAGNOSIS — Z992 Dependence on renal dialysis: Secondary | ICD-10-CM | POA: Diagnosis not present

## 2022-03-19 DIAGNOSIS — N186 End stage renal disease: Secondary | ICD-10-CM | POA: Diagnosis not present

## 2022-03-19 DIAGNOSIS — N2581 Secondary hyperparathyroidism of renal origin: Secondary | ICD-10-CM | POA: Diagnosis not present

## 2022-03-20 DIAGNOSIS — N186 End stage renal disease: Secondary | ICD-10-CM | POA: Diagnosis not present

## 2022-03-20 DIAGNOSIS — D631 Anemia in chronic kidney disease: Secondary | ICD-10-CM | POA: Diagnosis not present

## 2022-03-20 DIAGNOSIS — Z992 Dependence on renal dialysis: Secondary | ICD-10-CM | POA: Diagnosis not present

## 2022-03-20 DIAGNOSIS — N2581 Secondary hyperparathyroidism of renal origin: Secondary | ICD-10-CM | POA: Diagnosis not present

## 2022-03-21 DIAGNOSIS — Z992 Dependence on renal dialysis: Secondary | ICD-10-CM | POA: Diagnosis not present

## 2022-03-21 DIAGNOSIS — D631 Anemia in chronic kidney disease: Secondary | ICD-10-CM | POA: Diagnosis not present

## 2022-03-21 DIAGNOSIS — N2581 Secondary hyperparathyroidism of renal origin: Secondary | ICD-10-CM | POA: Diagnosis not present

## 2022-03-21 DIAGNOSIS — N186 End stage renal disease: Secondary | ICD-10-CM | POA: Diagnosis not present

## 2022-03-22 DIAGNOSIS — E785 Hyperlipidemia, unspecified: Secondary | ICD-10-CM | POA: Diagnosis not present

## 2022-03-22 DIAGNOSIS — N2581 Secondary hyperparathyroidism of renal origin: Secondary | ICD-10-CM | POA: Diagnosis not present

## 2022-03-22 DIAGNOSIS — Z992 Dependence on renal dialysis: Secondary | ICD-10-CM | POA: Diagnosis not present

## 2022-03-22 DIAGNOSIS — D631 Anemia in chronic kidney disease: Secondary | ICD-10-CM | POA: Diagnosis not present

## 2022-03-22 DIAGNOSIS — N186 End stage renal disease: Secondary | ICD-10-CM | POA: Diagnosis not present

## 2022-03-23 DIAGNOSIS — N2581 Secondary hyperparathyroidism of renal origin: Secondary | ICD-10-CM | POA: Diagnosis not present

## 2022-03-23 DIAGNOSIS — D631 Anemia in chronic kidney disease: Secondary | ICD-10-CM | POA: Diagnosis not present

## 2022-03-23 DIAGNOSIS — Z992 Dependence on renal dialysis: Secondary | ICD-10-CM | POA: Diagnosis not present

## 2022-03-23 DIAGNOSIS — N186 End stage renal disease: Secondary | ICD-10-CM | POA: Diagnosis not present

## 2022-03-24 DIAGNOSIS — D631 Anemia in chronic kidney disease: Secondary | ICD-10-CM | POA: Diagnosis not present

## 2022-03-24 DIAGNOSIS — N186 End stage renal disease: Secondary | ICD-10-CM | POA: Diagnosis not present

## 2022-03-24 DIAGNOSIS — N2581 Secondary hyperparathyroidism of renal origin: Secondary | ICD-10-CM | POA: Diagnosis not present

## 2022-03-24 DIAGNOSIS — Z992 Dependence on renal dialysis: Secondary | ICD-10-CM | POA: Diagnosis not present

## 2022-03-25 DIAGNOSIS — N2581 Secondary hyperparathyroidism of renal origin: Secondary | ICD-10-CM | POA: Diagnosis not present

## 2022-03-25 DIAGNOSIS — Z992 Dependence on renal dialysis: Secondary | ICD-10-CM | POA: Diagnosis not present

## 2022-03-25 DIAGNOSIS — D631 Anemia in chronic kidney disease: Secondary | ICD-10-CM | POA: Diagnosis not present

## 2022-03-25 DIAGNOSIS — N186 End stage renal disease: Secondary | ICD-10-CM | POA: Diagnosis not present

## 2022-03-26 DIAGNOSIS — D631 Anemia in chronic kidney disease: Secondary | ICD-10-CM | POA: Diagnosis not present

## 2022-03-26 DIAGNOSIS — N2581 Secondary hyperparathyroidism of renal origin: Secondary | ICD-10-CM | POA: Diagnosis not present

## 2022-03-26 DIAGNOSIS — Z992 Dependence on renal dialysis: Secondary | ICD-10-CM | POA: Diagnosis not present

## 2022-03-26 DIAGNOSIS — N186 End stage renal disease: Secondary | ICD-10-CM | POA: Diagnosis not present

## 2022-03-27 DIAGNOSIS — D631 Anemia in chronic kidney disease: Secondary | ICD-10-CM | POA: Diagnosis not present

## 2022-03-27 DIAGNOSIS — Z992 Dependence on renal dialysis: Secondary | ICD-10-CM | POA: Diagnosis not present

## 2022-03-27 DIAGNOSIS — N186 End stage renal disease: Secondary | ICD-10-CM | POA: Diagnosis not present

## 2022-03-27 DIAGNOSIS — N2581 Secondary hyperparathyroidism of renal origin: Secondary | ICD-10-CM | POA: Diagnosis not present

## 2022-03-28 DIAGNOSIS — N2581 Secondary hyperparathyroidism of renal origin: Secondary | ICD-10-CM | POA: Diagnosis not present

## 2022-03-28 DIAGNOSIS — D631 Anemia in chronic kidney disease: Secondary | ICD-10-CM | POA: Diagnosis not present

## 2022-03-28 DIAGNOSIS — Z992 Dependence on renal dialysis: Secondary | ICD-10-CM | POA: Diagnosis not present

## 2022-03-28 DIAGNOSIS — N186 End stage renal disease: Secondary | ICD-10-CM | POA: Diagnosis not present

## 2022-03-29 DIAGNOSIS — N2581 Secondary hyperparathyroidism of renal origin: Secondary | ICD-10-CM | POA: Diagnosis not present

## 2022-03-29 DIAGNOSIS — N186 End stage renal disease: Secondary | ICD-10-CM | POA: Diagnosis not present

## 2022-03-29 DIAGNOSIS — Z992 Dependence on renal dialysis: Secondary | ICD-10-CM | POA: Diagnosis not present

## 2022-03-29 DIAGNOSIS — D631 Anemia in chronic kidney disease: Secondary | ICD-10-CM | POA: Diagnosis not present

## 2022-03-30 DIAGNOSIS — N186 End stage renal disease: Secondary | ICD-10-CM | POA: Diagnosis not present

## 2022-03-30 DIAGNOSIS — D631 Anemia in chronic kidney disease: Secondary | ICD-10-CM | POA: Diagnosis not present

## 2022-03-30 DIAGNOSIS — Z992 Dependence on renal dialysis: Secondary | ICD-10-CM | POA: Diagnosis not present

## 2022-03-30 DIAGNOSIS — N2581 Secondary hyperparathyroidism of renal origin: Secondary | ICD-10-CM | POA: Diagnosis not present

## 2022-03-31 DIAGNOSIS — N186 End stage renal disease: Secondary | ICD-10-CM | POA: Diagnosis not present

## 2022-03-31 DIAGNOSIS — D631 Anemia in chronic kidney disease: Secondary | ICD-10-CM | POA: Diagnosis not present

## 2022-03-31 DIAGNOSIS — Z992 Dependence on renal dialysis: Secondary | ICD-10-CM | POA: Diagnosis not present

## 2022-03-31 DIAGNOSIS — N2581 Secondary hyperparathyroidism of renal origin: Secondary | ICD-10-CM | POA: Diagnosis not present

## 2022-04-01 DIAGNOSIS — Z992 Dependence on renal dialysis: Secondary | ICD-10-CM | POA: Diagnosis not present

## 2022-04-01 DIAGNOSIS — D631 Anemia in chronic kidney disease: Secondary | ICD-10-CM | POA: Diagnosis not present

## 2022-04-01 DIAGNOSIS — N186 End stage renal disease: Secondary | ICD-10-CM | POA: Diagnosis not present

## 2022-04-01 DIAGNOSIS — N2581 Secondary hyperparathyroidism of renal origin: Secondary | ICD-10-CM | POA: Diagnosis not present

## 2022-04-02 DIAGNOSIS — N2581 Secondary hyperparathyroidism of renal origin: Secondary | ICD-10-CM | POA: Diagnosis not present

## 2022-04-02 DIAGNOSIS — D631 Anemia in chronic kidney disease: Secondary | ICD-10-CM | POA: Diagnosis not present

## 2022-04-02 DIAGNOSIS — Z992 Dependence on renal dialysis: Secondary | ICD-10-CM | POA: Diagnosis not present

## 2022-04-02 DIAGNOSIS — N186 End stage renal disease: Secondary | ICD-10-CM | POA: Diagnosis not present

## 2022-04-03 DIAGNOSIS — D631 Anemia in chronic kidney disease: Secondary | ICD-10-CM | POA: Diagnosis not present

## 2022-04-03 DIAGNOSIS — Z992 Dependence on renal dialysis: Secondary | ICD-10-CM | POA: Diagnosis not present

## 2022-04-03 DIAGNOSIS — N186 End stage renal disease: Secondary | ICD-10-CM | POA: Diagnosis not present

## 2022-04-03 DIAGNOSIS — N2581 Secondary hyperparathyroidism of renal origin: Secondary | ICD-10-CM | POA: Diagnosis not present

## 2022-04-04 DIAGNOSIS — N2581 Secondary hyperparathyroidism of renal origin: Secondary | ICD-10-CM | POA: Diagnosis not present

## 2022-04-04 DIAGNOSIS — N186 End stage renal disease: Secondary | ICD-10-CM | POA: Diagnosis not present

## 2022-04-04 DIAGNOSIS — D631 Anemia in chronic kidney disease: Secondary | ICD-10-CM | POA: Diagnosis not present

## 2022-04-04 DIAGNOSIS — Z992 Dependence on renal dialysis: Secondary | ICD-10-CM | POA: Diagnosis not present

## 2022-04-05 DIAGNOSIS — D631 Anemia in chronic kidney disease: Secondary | ICD-10-CM | POA: Diagnosis not present

## 2022-04-05 DIAGNOSIS — N2581 Secondary hyperparathyroidism of renal origin: Secondary | ICD-10-CM | POA: Diagnosis not present

## 2022-04-05 DIAGNOSIS — Z992 Dependence on renal dialysis: Secondary | ICD-10-CM | POA: Diagnosis not present

## 2022-04-05 DIAGNOSIS — N186 End stage renal disease: Secondary | ICD-10-CM | POA: Diagnosis not present

## 2022-04-05 DIAGNOSIS — Z6841 Body Mass Index (BMI) 40.0 and over, adult: Secondary | ICD-10-CM | POA: Diagnosis not present

## 2022-04-05 DIAGNOSIS — E1122 Type 2 diabetes mellitus with diabetic chronic kidney disease: Secondary | ICD-10-CM | POA: Diagnosis not present

## 2022-04-06 DIAGNOSIS — D631 Anemia in chronic kidney disease: Secondary | ICD-10-CM | POA: Diagnosis not present

## 2022-04-06 DIAGNOSIS — N186 End stage renal disease: Secondary | ICD-10-CM | POA: Diagnosis not present

## 2022-04-06 DIAGNOSIS — Z992 Dependence on renal dialysis: Secondary | ICD-10-CM | POA: Diagnosis not present

## 2022-04-06 DIAGNOSIS — N2581 Secondary hyperparathyroidism of renal origin: Secondary | ICD-10-CM | POA: Diagnosis not present

## 2022-04-07 DIAGNOSIS — Z992 Dependence on renal dialysis: Secondary | ICD-10-CM | POA: Diagnosis not present

## 2022-04-07 DIAGNOSIS — N186 End stage renal disease: Secondary | ICD-10-CM | POA: Diagnosis not present

## 2022-04-07 DIAGNOSIS — N2581 Secondary hyperparathyroidism of renal origin: Secondary | ICD-10-CM | POA: Diagnosis not present

## 2022-04-07 DIAGNOSIS — D631 Anemia in chronic kidney disease: Secondary | ICD-10-CM | POA: Diagnosis not present

## 2022-04-08 DIAGNOSIS — D631 Anemia in chronic kidney disease: Secondary | ICD-10-CM | POA: Diagnosis not present

## 2022-04-08 DIAGNOSIS — N186 End stage renal disease: Secondary | ICD-10-CM | POA: Diagnosis not present

## 2022-04-08 DIAGNOSIS — N2581 Secondary hyperparathyroidism of renal origin: Secondary | ICD-10-CM | POA: Diagnosis not present

## 2022-04-08 DIAGNOSIS — Z992 Dependence on renal dialysis: Secondary | ICD-10-CM | POA: Diagnosis not present

## 2022-04-09 DIAGNOSIS — Z992 Dependence on renal dialysis: Secondary | ICD-10-CM | POA: Diagnosis not present

## 2022-04-09 DIAGNOSIS — N186 End stage renal disease: Secondary | ICD-10-CM | POA: Diagnosis not present

## 2022-04-09 DIAGNOSIS — D631 Anemia in chronic kidney disease: Secondary | ICD-10-CM | POA: Diagnosis not present

## 2022-04-09 DIAGNOSIS — N2581 Secondary hyperparathyroidism of renal origin: Secondary | ICD-10-CM | POA: Diagnosis not present

## 2022-04-10 DIAGNOSIS — Z992 Dependence on renal dialysis: Secondary | ICD-10-CM | POA: Diagnosis not present

## 2022-04-10 DIAGNOSIS — N186 End stage renal disease: Secondary | ICD-10-CM | POA: Diagnosis not present

## 2022-04-10 DIAGNOSIS — D631 Anemia in chronic kidney disease: Secondary | ICD-10-CM | POA: Diagnosis not present

## 2022-04-10 DIAGNOSIS — N2581 Secondary hyperparathyroidism of renal origin: Secondary | ICD-10-CM | POA: Diagnosis not present

## 2022-04-11 DIAGNOSIS — D631 Anemia in chronic kidney disease: Secondary | ICD-10-CM | POA: Diagnosis not present

## 2022-04-11 DIAGNOSIS — N2581 Secondary hyperparathyroidism of renal origin: Secondary | ICD-10-CM | POA: Diagnosis not present

## 2022-04-11 DIAGNOSIS — N186 End stage renal disease: Secondary | ICD-10-CM | POA: Diagnosis not present

## 2022-04-11 DIAGNOSIS — Z992 Dependence on renal dialysis: Secondary | ICD-10-CM | POA: Diagnosis not present

## 2022-04-12 DIAGNOSIS — Z992 Dependence on renal dialysis: Secondary | ICD-10-CM | POA: Diagnosis not present

## 2022-04-12 DIAGNOSIS — D631 Anemia in chronic kidney disease: Secondary | ICD-10-CM | POA: Diagnosis not present

## 2022-04-12 DIAGNOSIS — N2581 Secondary hyperparathyroidism of renal origin: Secondary | ICD-10-CM | POA: Diagnosis not present

## 2022-04-12 DIAGNOSIS — N186 End stage renal disease: Secondary | ICD-10-CM | POA: Diagnosis not present

## 2022-04-13 DIAGNOSIS — N2581 Secondary hyperparathyroidism of renal origin: Secondary | ICD-10-CM | POA: Diagnosis not present

## 2022-04-13 DIAGNOSIS — N186 End stage renal disease: Secondary | ICD-10-CM | POA: Diagnosis not present

## 2022-04-13 DIAGNOSIS — D631 Anemia in chronic kidney disease: Secondary | ICD-10-CM | POA: Diagnosis not present

## 2022-04-13 DIAGNOSIS — Z992 Dependence on renal dialysis: Secondary | ICD-10-CM | POA: Diagnosis not present

## 2022-04-14 DIAGNOSIS — Z992 Dependence on renal dialysis: Secondary | ICD-10-CM | POA: Diagnosis not present

## 2022-04-14 DIAGNOSIS — E1122 Type 2 diabetes mellitus with diabetic chronic kidney disease: Secondary | ICD-10-CM | POA: Diagnosis not present

## 2022-04-14 DIAGNOSIS — N186 End stage renal disease: Secondary | ICD-10-CM | POA: Diagnosis not present

## 2022-04-14 DIAGNOSIS — N2581 Secondary hyperparathyroidism of renal origin: Secondary | ICD-10-CM | POA: Diagnosis not present

## 2022-04-14 DIAGNOSIS — D631 Anemia in chronic kidney disease: Secondary | ICD-10-CM | POA: Diagnosis not present

## 2022-04-17 DIAGNOSIS — Z992 Dependence on renal dialysis: Secondary | ICD-10-CM | POA: Diagnosis not present

## 2022-04-17 DIAGNOSIS — N2581 Secondary hyperparathyroidism of renal origin: Secondary | ICD-10-CM | POA: Diagnosis not present

## 2022-04-17 DIAGNOSIS — N186 End stage renal disease: Secondary | ICD-10-CM | POA: Diagnosis not present

## 2022-04-17 DIAGNOSIS — D631 Anemia in chronic kidney disease: Secondary | ICD-10-CM | POA: Diagnosis not present

## 2022-04-18 DIAGNOSIS — D631 Anemia in chronic kidney disease: Secondary | ICD-10-CM | POA: Diagnosis not present

## 2022-04-18 DIAGNOSIS — N2581 Secondary hyperparathyroidism of renal origin: Secondary | ICD-10-CM | POA: Diagnosis not present

## 2022-04-18 DIAGNOSIS — N186 End stage renal disease: Secondary | ICD-10-CM | POA: Diagnosis not present

## 2022-04-18 DIAGNOSIS — Z992 Dependence on renal dialysis: Secondary | ICD-10-CM | POA: Diagnosis not present

## 2022-04-19 ENCOUNTER — Ambulatory Visit (INDEPENDENT_AMBULATORY_CARE_PROVIDER_SITE_OTHER): Payer: Medicare Other | Admitting: Internal Medicine

## 2022-04-19 ENCOUNTER — Encounter: Payer: Self-pay | Admitting: Internal Medicine

## 2022-04-19 VITALS — BP 160/50 | HR 95 | Ht 67.0 in | Wt 293.6 lb

## 2022-04-19 DIAGNOSIS — Z992 Dependence on renal dialysis: Secondary | ICD-10-CM

## 2022-04-19 DIAGNOSIS — N186 End stage renal disease: Secondary | ICD-10-CM | POA: Diagnosis not present

## 2022-04-19 DIAGNOSIS — Z86010 Personal history of colonic polyps: Secondary | ICD-10-CM

## 2022-04-19 DIAGNOSIS — D631 Anemia in chronic kidney disease: Secondary | ICD-10-CM | POA: Diagnosis not present

## 2022-04-19 DIAGNOSIS — N2581 Secondary hyperparathyroidism of renal origin: Secondary | ICD-10-CM | POA: Diagnosis not present

## 2022-04-19 NOTE — Patient Instructions (Signed)
_______________________________________________________  If you are age 51 or older, your body mass index should be between 23-30. Your Body mass index is 45.98 kg/m. If this is out of the aforementioned range listed, please consider follow up with your Primary Care Provider.  If you are age 2 or younger, your body mass index should be between 19-25. Your Body mass index is 45.98 kg/m. If this is out of the aformentioned range listed, please consider follow up with your Primary Care Provider.   ________________________________________________________  The [facility] providers would like to encourage you to use [facility] [address] to communicate with providers for non-urgent requests or questions.  Due to long hold times on the telephone, sending your provider a message by [facility] may be a faster and more efficient way to get a response.  Please allow 48 business hours for a response.  Please remember that this is for non-urgent requests.  _______________________________________________________  I will call you next month to schedule a colonoscopy for March at the hospital

## 2022-04-19 NOTE — Progress Notes (Signed)
HISTORY OF PRESENT ILLNESS:  Adrian Frye is a 50 y.o. male, [employer] graduate and Clinical cytogeneticist, with morbid obesity, hypertension, hyperlipidemia, and end-stage renal disease on peritoneal dialysis who presents today regarding surveillance colonoscopy.  The patient was initially evaluated in this office February 07, 2019 regarding anemia and Hemoccult positive stool.  See that dictation for details.  He subsequently underwent colonoscopy and upper endoscopy March 12, 2019.  Upper endoscopy was normal.  Colonoscopy revealed 2 tubular adenomas including a 35 mm polyp in the descending colon.  Follow-up in 3 years recommended.  Patient received a recall letter and presents to the office for evaluation regarding surveillance colonoscopy, particularly in light of his comorbidities.  Patient tells me that he has been doing well except for increased intestinal gas as manifested by both belching and flatus.  No abdominal pain or other issues.  He feels that this is excessive.  Since his last colonoscopy he has transition from hemodialysis to peritoneal dialysis.  Current BMI is 46.  No blood thinners.  REVIEW OF SYSTEMS:  All non-GI ROS negative unless otherwise stated in the HPI.  Past Medical History:  Diagnosis Date   Allergic rhinitis    Allergy    Anemia    Asthma    as a child   Bacteremia    Benign essential hypertension    Dilated idiopathic cardiomyopathy ([place])    now resoved with EF 55% by echo 2011   Edema    Encounter for blood transfusion    Endocarditis    ESRD (end stage renal disease) ([place])    ESRD on peritoneal dialysis ([place])    Heart disease    Hyperlipidemia    Hyperparathyroidism, secondary ([place])    Hypertension    Morbid obesity ([place])    Sleep apnea    c-pap   UGI bleed 2011   ASA    Past Surgical History:  Procedure Laterality Date   ACHILLES TENDON REPAIR     ruptured; right   AV FISTULA PLACEMENT Left 11/03/2017   Procedure:  ARTERIOVENOUS (AV) FISTULA CREATION  left upper arm;  Surgeon: Rosetta Posner, MD;  Location: [facility];  Service: Vascular;  Laterality: Left;   DIALYSIS FISTULA CREATION     INSERTION OF DIALYSIS CATHETER N/A 11/03/2017   Procedure: INSERTION OF DIALYSIS CATHETER - RIGHT INTERNAL JUGULAR PLACEMENT;  Surgeon: Rosetta Posner, MD;  Location[facility];  Service: Vascular;  Laterality: N/A;   IR FLUORO GUIDE CV LINE LEFT  01/24/2020   IR REMOVAL TUN CV CATH W/O FL  01/21/2020   IR REMOVAL TUN CV CATH W/O FL  03/06/2020   IR US GUIDE VASC ACCESS LEFT  01/24/2020   KIDNEY FAILURE     LAPAROSCOPIC GASTROTOMY W/ REPAIR OF ULCER     PLEURAL SCARIFICATION     left, football trauma-chest tube   STOMACH SURGERY     TEE WITHOUT CARDIOVERSION N/A 01/22/2020   Procedure: TRANSESOPHAGEAL ECHOCARDIOGRAM (TEE);  Surgeon: Pixie Casino, MD;  Location: [facility] ENDOSCOPY;  Service: Cardiovascular;  Laterality: N/A;   TONSILLECTOMY     TONSILLECTOMY     UPPER GASTROINTESTINAL ENDOSCOPY      Social History Adrian Frye  reports that he quit smoking about 12 years ago. His smoking use included cigarettes. He has never used smokeless tobacco. He reports that he does not currently use alcohol after a past usage of about 2.0 standard drinks of alcohol per week. He reports that he  does not use drugs.  family history includes Diabetes in his mother; Heart disease in his father; Heart failure in his mother; Hypertension in his brother, brother, and mother.  Allergies  Allergen Reactions   Lisinopril Swelling and Anaphylaxis    Angioedema  Other reaction(s): Angioedema (ALLERGY/intolerance)   Losartan Swelling and Anaphylaxis       PHYSICAL EXAMINATION: Vital signs: Pulse 95   Ht '5\' 7"'$  (1.702 m)   Wt 293 lb 9.6 oz (133.2 kg)   SpO2 98%   BMI 45.98 kg/m   Constitutional: Morbidly obese, no acute distress Psychiatric: Pleasant, alert and oriented x 3, cooperative Eyes: extraocular movements intact, anicteric,  conjunctiva pink Mouth: oral pharynx moist, no lesions Neck: supple no lymphadenopathy Cardiovascular: heart regular rate and rhythm, no murmur Lungs: clear to auscultation bilaterally Abdomen: soft, nontender, nondistended, no obvious ascites, no peritoneal signs, normal bowel sounds, no organomegaly Rectal: Deferred to colonoscopy Extremities: no clubbing, cyanosis, or lower extremity edema bilaterally Skin: no lesions on visible extremities Neuro: No focal deficits.  Cranial nerves intact  ASSESSMENT:  1.  History of multiple advanced adenomatous colon polyps October 2020.  Due for surveillance. 2.  Multiple comorbidities including morbid obesity with BMI of 46 and end-stage renal disease on peritoneal dialysis.   PLAN:  1.  Surveillance colonoscopy.  The patient is HIGH RISK given his comorbidities and body habitus.  As such, his procedure will need to be performed at the hospital with monitored anesthesia care.The nature of the procedure, as well as the risks, benefits, and alternatives were carefully and thoroughly reviewed with the patient. Ample time for discussion and questions allowed. The patient understood, was satisfied, and agreed to proceed.

## 2022-04-20 DIAGNOSIS — N2581 Secondary hyperparathyroidism of renal origin: Secondary | ICD-10-CM | POA: Diagnosis not present

## 2022-04-20 DIAGNOSIS — N186 End stage renal disease: Secondary | ICD-10-CM | POA: Diagnosis not present

## 2022-04-20 DIAGNOSIS — Z992 Dependence on renal dialysis: Secondary | ICD-10-CM | POA: Diagnosis not present

## 2022-04-20 DIAGNOSIS — D631 Anemia in chronic kidney disease: Secondary | ICD-10-CM | POA: Diagnosis not present

## 2022-04-21 DIAGNOSIS — D631 Anemia in chronic kidney disease: Secondary | ICD-10-CM | POA: Diagnosis not present

## 2022-04-21 DIAGNOSIS — N186 End stage renal disease: Secondary | ICD-10-CM | POA: Diagnosis not present

## 2022-04-21 DIAGNOSIS — N2581 Secondary hyperparathyroidism of renal origin: Secondary | ICD-10-CM | POA: Diagnosis not present

## 2022-04-21 DIAGNOSIS — Z992 Dependence on renal dialysis: Secondary | ICD-10-CM | POA: Diagnosis not present

## 2022-04-22 DIAGNOSIS — D631 Anemia in chronic kidney disease: Secondary | ICD-10-CM | POA: Diagnosis not present

## 2022-04-22 DIAGNOSIS — Z992 Dependence on renal dialysis: Secondary | ICD-10-CM | POA: Diagnosis not present

## 2022-04-22 DIAGNOSIS — N186 End stage renal disease: Secondary | ICD-10-CM | POA: Diagnosis not present

## 2022-04-22 DIAGNOSIS — N2581 Secondary hyperparathyroidism of renal origin: Secondary | ICD-10-CM | POA: Diagnosis not present

## 2022-04-23 DIAGNOSIS — N2581 Secondary hyperparathyroidism of renal origin: Secondary | ICD-10-CM | POA: Diagnosis not present

## 2022-04-23 DIAGNOSIS — Z992 Dependence on renal dialysis: Secondary | ICD-10-CM | POA: Diagnosis not present

## 2022-04-23 DIAGNOSIS — N186 End stage renal disease: Secondary | ICD-10-CM | POA: Diagnosis not present

## 2022-04-23 DIAGNOSIS — D631 Anemia in chronic kidney disease: Secondary | ICD-10-CM | POA: Diagnosis not present

## 2022-04-24 DIAGNOSIS — Z992 Dependence on renal dialysis: Secondary | ICD-10-CM | POA: Diagnosis not present

## 2022-04-24 DIAGNOSIS — D631 Anemia in chronic kidney disease: Secondary | ICD-10-CM | POA: Diagnosis not present

## 2022-04-24 DIAGNOSIS — N2581 Secondary hyperparathyroidism of renal origin: Secondary | ICD-10-CM | POA: Diagnosis not present

## 2022-04-24 DIAGNOSIS — N186 End stage renal disease: Secondary | ICD-10-CM | POA: Diagnosis not present

## 2022-04-25 DIAGNOSIS — D631 Anemia in chronic kidney disease: Secondary | ICD-10-CM | POA: Diagnosis not present

## 2022-04-25 DIAGNOSIS — N2581 Secondary hyperparathyroidism of renal origin: Secondary | ICD-10-CM | POA: Diagnosis not present

## 2022-04-25 DIAGNOSIS — Z992 Dependence on renal dialysis: Secondary | ICD-10-CM | POA: Diagnosis not present

## 2022-04-25 DIAGNOSIS — N186 End stage renal disease: Secondary | ICD-10-CM | POA: Diagnosis not present

## 2022-04-26 DIAGNOSIS — N2581 Secondary hyperparathyroidism of renal origin: Secondary | ICD-10-CM | POA: Diagnosis not present

## 2022-04-26 DIAGNOSIS — Z992 Dependence on renal dialysis: Secondary | ICD-10-CM | POA: Diagnosis not present

## 2022-04-26 DIAGNOSIS — D631 Anemia in chronic kidney disease: Secondary | ICD-10-CM | POA: Diagnosis not present

## 2022-04-26 DIAGNOSIS — N186 End stage renal disease: Secondary | ICD-10-CM | POA: Diagnosis not present

## 2022-04-27 DIAGNOSIS — D631 Anemia in chronic kidney disease: Secondary | ICD-10-CM | POA: Diagnosis not present

## 2022-04-27 DIAGNOSIS — N2581 Secondary hyperparathyroidism of renal origin: Secondary | ICD-10-CM | POA: Diagnosis not present

## 2022-04-27 DIAGNOSIS — N186 End stage renal disease: Secondary | ICD-10-CM | POA: Diagnosis not present

## 2022-04-27 DIAGNOSIS — Z992 Dependence on renal dialysis: Secondary | ICD-10-CM | POA: Diagnosis not present

## 2022-04-28 DIAGNOSIS — D631 Anemia in chronic kidney disease: Secondary | ICD-10-CM | POA: Diagnosis not present

## 2022-04-28 DIAGNOSIS — N186 End stage renal disease: Secondary | ICD-10-CM | POA: Diagnosis not present

## 2022-04-28 DIAGNOSIS — Z992 Dependence on renal dialysis: Secondary | ICD-10-CM | POA: Diagnosis not present

## 2022-04-28 DIAGNOSIS — N2581 Secondary hyperparathyroidism of renal origin: Secondary | ICD-10-CM | POA: Diagnosis not present

## 2022-04-29 DIAGNOSIS — N186 End stage renal disease: Secondary | ICD-10-CM | POA: Diagnosis not present

## 2022-04-29 DIAGNOSIS — N2581 Secondary hyperparathyroidism of renal origin: Secondary | ICD-10-CM | POA: Diagnosis not present

## 2022-04-29 DIAGNOSIS — D631 Anemia in chronic kidney disease: Secondary | ICD-10-CM | POA: Diagnosis not present

## 2022-04-29 DIAGNOSIS — Z992 Dependence on renal dialysis: Secondary | ICD-10-CM | POA: Diagnosis not present

## 2022-04-30 DIAGNOSIS — N2581 Secondary hyperparathyroidism of renal origin: Secondary | ICD-10-CM | POA: Diagnosis not present

## 2022-04-30 DIAGNOSIS — Z992 Dependence on renal dialysis: Secondary | ICD-10-CM | POA: Diagnosis not present

## 2022-04-30 DIAGNOSIS — N186 End stage renal disease: Secondary | ICD-10-CM | POA: Diagnosis not present

## 2022-04-30 DIAGNOSIS — D631 Anemia in chronic kidney disease: Secondary | ICD-10-CM | POA: Diagnosis not present

## 2022-05-01 DIAGNOSIS — N2581 Secondary hyperparathyroidism of renal origin: Secondary | ICD-10-CM | POA: Diagnosis not present

## 2022-05-01 DIAGNOSIS — D631 Anemia in chronic kidney disease: Secondary | ICD-10-CM | POA: Diagnosis not present

## 2022-05-01 DIAGNOSIS — Z992 Dependence on renal dialysis: Secondary | ICD-10-CM | POA: Diagnosis not present

## 2022-05-01 DIAGNOSIS — N186 End stage renal disease: Secondary | ICD-10-CM | POA: Diagnosis not present

## 2022-05-02 DIAGNOSIS — N186 End stage renal disease: Secondary | ICD-10-CM | POA: Diagnosis not present

## 2022-05-02 DIAGNOSIS — N2581 Secondary hyperparathyroidism of renal origin: Secondary | ICD-10-CM | POA: Diagnosis not present

## 2022-05-02 DIAGNOSIS — D631 Anemia in chronic kidney disease: Secondary | ICD-10-CM | POA: Diagnosis not present

## 2022-05-02 DIAGNOSIS — Z992 Dependence on renal dialysis: Secondary | ICD-10-CM | POA: Diagnosis not present

## 2022-05-03 DIAGNOSIS — D631 Anemia in chronic kidney disease: Secondary | ICD-10-CM | POA: Diagnosis not present

## 2022-05-03 DIAGNOSIS — N2581 Secondary hyperparathyroidism of renal origin: Secondary | ICD-10-CM | POA: Diagnosis not present

## 2022-05-03 DIAGNOSIS — N186 End stage renal disease: Secondary | ICD-10-CM | POA: Diagnosis not present

## 2022-05-03 DIAGNOSIS — Z992 Dependence on renal dialysis: Secondary | ICD-10-CM | POA: Diagnosis not present

## 2022-05-04 DIAGNOSIS — D631 Anemia in chronic kidney disease: Secondary | ICD-10-CM | POA: Diagnosis not present

## 2022-05-04 DIAGNOSIS — Z992 Dependence on renal dialysis: Secondary | ICD-10-CM | POA: Diagnosis not present

## 2022-05-04 DIAGNOSIS — N186 End stage renal disease: Secondary | ICD-10-CM | POA: Diagnosis not present

## 2022-05-04 DIAGNOSIS — N2581 Secondary hyperparathyroidism of renal origin: Secondary | ICD-10-CM | POA: Diagnosis not present

## 2022-05-05 DIAGNOSIS — N186 End stage renal disease: Secondary | ICD-10-CM | POA: Diagnosis not present

## 2022-05-05 DIAGNOSIS — Z992 Dependence on renal dialysis: Secondary | ICD-10-CM | POA: Diagnosis not present

## 2022-05-05 DIAGNOSIS — N2581 Secondary hyperparathyroidism of renal origin: Secondary | ICD-10-CM | POA: Diagnosis not present

## 2022-05-05 DIAGNOSIS — D631 Anemia in chronic kidney disease: Secondary | ICD-10-CM | POA: Diagnosis not present

## 2022-05-06 DIAGNOSIS — N186 End stage renal disease: Secondary | ICD-10-CM | POA: Diagnosis not present

## 2022-05-06 DIAGNOSIS — D631 Anemia in chronic kidney disease: Secondary | ICD-10-CM | POA: Diagnosis not present

## 2022-05-06 DIAGNOSIS — N2581 Secondary hyperparathyroidism of renal origin: Secondary | ICD-10-CM | POA: Diagnosis not present

## 2022-05-06 DIAGNOSIS — Z992 Dependence on renal dialysis: Secondary | ICD-10-CM | POA: Diagnosis not present

## 2022-05-07 DIAGNOSIS — Z992 Dependence on renal dialysis: Secondary | ICD-10-CM | POA: Diagnosis not present

## 2022-05-07 DIAGNOSIS — N186 End stage renal disease: Secondary | ICD-10-CM | POA: Diagnosis not present

## 2022-05-07 DIAGNOSIS — D631 Anemia in chronic kidney disease: Secondary | ICD-10-CM | POA: Diagnosis not present

## 2022-05-07 DIAGNOSIS — N2581 Secondary hyperparathyroidism of renal origin: Secondary | ICD-10-CM | POA: Diagnosis not present

## 2022-05-08 DIAGNOSIS — D631 Anemia in chronic kidney disease: Secondary | ICD-10-CM | POA: Diagnosis not present

## 2022-05-08 DIAGNOSIS — N186 End stage renal disease: Secondary | ICD-10-CM | POA: Diagnosis not present

## 2022-05-08 DIAGNOSIS — Z992 Dependence on renal dialysis: Secondary | ICD-10-CM | POA: Diagnosis not present

## 2022-05-08 DIAGNOSIS — N2581 Secondary hyperparathyroidism of renal origin: Secondary | ICD-10-CM | POA: Diagnosis not present

## 2022-05-09 DIAGNOSIS — N186 End stage renal disease: Secondary | ICD-10-CM | POA: Diagnosis not present

## 2022-05-09 DIAGNOSIS — D631 Anemia in chronic kidney disease: Secondary | ICD-10-CM | POA: Diagnosis not present

## 2022-05-09 DIAGNOSIS — Z992 Dependence on renal dialysis: Secondary | ICD-10-CM | POA: Diagnosis not present

## 2022-05-09 DIAGNOSIS — N2581 Secondary hyperparathyroidism of renal origin: Secondary | ICD-10-CM | POA: Diagnosis not present

## 2022-05-10 DIAGNOSIS — N186 End stage renal disease: Secondary | ICD-10-CM | POA: Diagnosis not present

## 2022-05-10 DIAGNOSIS — E1122 Type 2 diabetes mellitus with diabetic chronic kidney disease: Secondary | ICD-10-CM | POA: Diagnosis not present

## 2022-05-10 DIAGNOSIS — Z992 Dependence on renal dialysis: Secondary | ICD-10-CM | POA: Diagnosis not present

## 2022-05-10 DIAGNOSIS — N2581 Secondary hyperparathyroidism of renal origin: Secondary | ICD-10-CM | POA: Diagnosis not present

## 2022-05-10 DIAGNOSIS — E211 Secondary hyperparathyroidism, not elsewhere classified: Secondary | ICD-10-CM | POA: Diagnosis not present

## 2022-05-10 DIAGNOSIS — D631 Anemia in chronic kidney disease: Secondary | ICD-10-CM | POA: Diagnosis not present

## 2022-05-11 DIAGNOSIS — N186 End stage renal disease: Secondary | ICD-10-CM | POA: Diagnosis not present

## 2022-05-11 DIAGNOSIS — D631 Anemia in chronic kidney disease: Secondary | ICD-10-CM | POA: Diagnosis not present

## 2022-05-11 DIAGNOSIS — Z992 Dependence on renal dialysis: Secondary | ICD-10-CM | POA: Diagnosis not present

## 2022-05-11 DIAGNOSIS — N2581 Secondary hyperparathyroidism of renal origin: Secondary | ICD-10-CM | POA: Diagnosis not present

## 2022-05-12 DIAGNOSIS — N186 End stage renal disease: Secondary | ICD-10-CM | POA: Diagnosis not present

## 2022-05-12 DIAGNOSIS — Z992 Dependence on renal dialysis: Secondary | ICD-10-CM | POA: Diagnosis not present

## 2022-05-12 DIAGNOSIS — N2581 Secondary hyperparathyroidism of renal origin: Secondary | ICD-10-CM | POA: Diagnosis not present

## 2022-05-12 DIAGNOSIS — D631 Anemia in chronic kidney disease: Secondary | ICD-10-CM | POA: Diagnosis not present

## 2022-05-13 DIAGNOSIS — D631 Anemia in chronic kidney disease: Secondary | ICD-10-CM | POA: Diagnosis not present

## 2022-05-13 DIAGNOSIS — N186 End stage renal disease: Secondary | ICD-10-CM | POA: Diagnosis not present

## 2022-05-13 DIAGNOSIS — Z992 Dependence on renal dialysis: Secondary | ICD-10-CM | POA: Diagnosis not present

## 2022-05-13 DIAGNOSIS — N2581 Secondary hyperparathyroidism of renal origin: Secondary | ICD-10-CM | POA: Diagnosis not present

## 2022-05-14 DIAGNOSIS — Z992 Dependence on renal dialysis: Secondary | ICD-10-CM | POA: Diagnosis not present

## 2022-05-14 DIAGNOSIS — N186 End stage renal disease: Secondary | ICD-10-CM | POA: Diagnosis not present

## 2022-05-14 DIAGNOSIS — D631 Anemia in chronic kidney disease: Secondary | ICD-10-CM | POA: Diagnosis not present

## 2022-05-14 DIAGNOSIS — N2581 Secondary hyperparathyroidism of renal origin: Secondary | ICD-10-CM | POA: Diagnosis not present

## 2022-05-15 DIAGNOSIS — N186 End stage renal disease: Secondary | ICD-10-CM | POA: Diagnosis not present

## 2022-05-15 DIAGNOSIS — D631 Anemia in chronic kidney disease: Secondary | ICD-10-CM | POA: Diagnosis not present

## 2022-05-15 DIAGNOSIS — E1122 Type 2 diabetes mellitus with diabetic chronic kidney disease: Secondary | ICD-10-CM | POA: Diagnosis not present

## 2022-05-15 DIAGNOSIS — Z992 Dependence on renal dialysis: Secondary | ICD-10-CM | POA: Diagnosis not present

## 2022-05-15 DIAGNOSIS — N2581 Secondary hyperparathyroidism of renal origin: Secondary | ICD-10-CM | POA: Diagnosis not present

## 2022-05-16 DIAGNOSIS — Z4932 Encounter for adequacy testing for peritoneal dialysis: Secondary | ICD-10-CM | POA: Diagnosis not present

## 2022-05-16 DIAGNOSIS — N2581 Secondary hyperparathyroidism of renal origin: Secondary | ICD-10-CM | POA: Diagnosis not present

## 2022-05-16 DIAGNOSIS — D631 Anemia in chronic kidney disease: Secondary | ICD-10-CM | POA: Diagnosis not present

## 2022-05-16 DIAGNOSIS — N186 End stage renal disease: Secondary | ICD-10-CM | POA: Diagnosis not present

## 2022-05-16 DIAGNOSIS — Z992 Dependence on renal dialysis: Secondary | ICD-10-CM | POA: Diagnosis not present

## 2022-05-17 DIAGNOSIS — N2581 Secondary hyperparathyroidism of renal origin: Secondary | ICD-10-CM | POA: Diagnosis not present

## 2022-05-17 DIAGNOSIS — D631 Anemia in chronic kidney disease: Secondary | ICD-10-CM | POA: Diagnosis not present

## 2022-05-17 DIAGNOSIS — Z992 Dependence on renal dialysis: Secondary | ICD-10-CM | POA: Diagnosis not present

## 2022-05-17 DIAGNOSIS — N186 End stage renal disease: Secondary | ICD-10-CM | POA: Diagnosis not present

## 2022-05-17 DIAGNOSIS — Z4932 Encounter for adequacy testing for peritoneal dialysis: Secondary | ICD-10-CM | POA: Diagnosis not present

## 2022-05-18 DIAGNOSIS — N2581 Secondary hyperparathyroidism of renal origin: Secondary | ICD-10-CM | POA: Diagnosis not present

## 2022-05-18 DIAGNOSIS — Z4932 Encounter for adequacy testing for peritoneal dialysis: Secondary | ICD-10-CM | POA: Diagnosis not present

## 2022-05-18 DIAGNOSIS — D631 Anemia in chronic kidney disease: Secondary | ICD-10-CM | POA: Diagnosis not present

## 2022-05-18 DIAGNOSIS — N186 End stage renal disease: Secondary | ICD-10-CM | POA: Diagnosis not present

## 2022-05-18 DIAGNOSIS — Z992 Dependence on renal dialysis: Secondary | ICD-10-CM | POA: Diagnosis not present

## 2022-05-19 DIAGNOSIS — N2581 Secondary hyperparathyroidism of renal origin: Secondary | ICD-10-CM | POA: Diagnosis not present

## 2022-05-19 DIAGNOSIS — N186 End stage renal disease: Secondary | ICD-10-CM | POA: Diagnosis not present

## 2022-05-19 DIAGNOSIS — Z4932 Encounter for adequacy testing for peritoneal dialysis: Secondary | ICD-10-CM | POA: Diagnosis not present

## 2022-05-19 DIAGNOSIS — D631 Anemia in chronic kidney disease: Secondary | ICD-10-CM | POA: Diagnosis not present

## 2022-05-19 DIAGNOSIS — Z992 Dependence on renal dialysis: Secondary | ICD-10-CM | POA: Diagnosis not present

## 2022-05-20 DIAGNOSIS — N2581 Secondary hyperparathyroidism of renal origin: Secondary | ICD-10-CM | POA: Diagnosis not present

## 2022-05-20 DIAGNOSIS — Z992 Dependence on renal dialysis: Secondary | ICD-10-CM | POA: Diagnosis not present

## 2022-05-20 DIAGNOSIS — D631 Anemia in chronic kidney disease: Secondary | ICD-10-CM | POA: Diagnosis not present

## 2022-05-20 DIAGNOSIS — N186 End stage renal disease: Secondary | ICD-10-CM | POA: Diagnosis not present

## 2022-05-20 DIAGNOSIS — Z4932 Encounter for adequacy testing for peritoneal dialysis: Secondary | ICD-10-CM | POA: Diagnosis not present

## 2022-05-21 DIAGNOSIS — N2581 Secondary hyperparathyroidism of renal origin: Secondary | ICD-10-CM | POA: Diagnosis not present

## 2022-05-21 DIAGNOSIS — Z4932 Encounter for adequacy testing for peritoneal dialysis: Secondary | ICD-10-CM | POA: Diagnosis not present

## 2022-05-21 DIAGNOSIS — N186 End stage renal disease: Secondary | ICD-10-CM | POA: Diagnosis not present

## 2022-05-21 DIAGNOSIS — D631 Anemia in chronic kidney disease: Secondary | ICD-10-CM | POA: Diagnosis not present

## 2022-05-21 DIAGNOSIS — Z992 Dependence on renal dialysis: Secondary | ICD-10-CM | POA: Diagnosis not present

## 2022-05-22 DIAGNOSIS — Z992 Dependence on renal dialysis: Secondary | ICD-10-CM | POA: Diagnosis not present

## 2022-05-22 DIAGNOSIS — Z4932 Encounter for adequacy testing for peritoneal dialysis: Secondary | ICD-10-CM | POA: Diagnosis not present

## 2022-05-22 DIAGNOSIS — D631 Anemia in chronic kidney disease: Secondary | ICD-10-CM | POA: Diagnosis not present

## 2022-05-22 DIAGNOSIS — N186 End stage renal disease: Secondary | ICD-10-CM | POA: Diagnosis not present

## 2022-05-22 DIAGNOSIS — N2581 Secondary hyperparathyroidism of renal origin: Secondary | ICD-10-CM | POA: Diagnosis not present

## 2022-05-23 DIAGNOSIS — Z4932 Encounter for adequacy testing for peritoneal dialysis: Secondary | ICD-10-CM | POA: Diagnosis not present

## 2022-05-23 DIAGNOSIS — N186 End stage renal disease: Secondary | ICD-10-CM | POA: Diagnosis not present

## 2022-05-23 DIAGNOSIS — Z992 Dependence on renal dialysis: Secondary | ICD-10-CM | POA: Diagnosis not present

## 2022-05-23 DIAGNOSIS — D631 Anemia in chronic kidney disease: Secondary | ICD-10-CM | POA: Diagnosis not present

## 2022-05-23 DIAGNOSIS — N2581 Secondary hyperparathyroidism of renal origin: Secondary | ICD-10-CM | POA: Diagnosis not present

## 2022-05-24 ENCOUNTER — Encounter: Payer: Self-pay | Admitting: Internal Medicine

## 2022-05-24 DIAGNOSIS — Z992 Dependence on renal dialysis: Secondary | ICD-10-CM | POA: Diagnosis not present

## 2022-05-24 DIAGNOSIS — D631 Anemia in chronic kidney disease: Secondary | ICD-10-CM | POA: Diagnosis not present

## 2022-05-24 DIAGNOSIS — N2581 Secondary hyperparathyroidism of renal origin: Secondary | ICD-10-CM | POA: Diagnosis not present

## 2022-05-24 DIAGNOSIS — N186 End stage renal disease: Secondary | ICD-10-CM | POA: Diagnosis not present

## 2022-05-24 DIAGNOSIS — Z4932 Encounter for adequacy testing for peritoneal dialysis: Secondary | ICD-10-CM | POA: Diagnosis not present

## 2022-05-25 DIAGNOSIS — Z4932 Encounter for adequacy testing for peritoneal dialysis: Secondary | ICD-10-CM | POA: Diagnosis not present

## 2022-05-25 DIAGNOSIS — Z992 Dependence on renal dialysis: Secondary | ICD-10-CM | POA: Diagnosis not present

## 2022-05-25 DIAGNOSIS — D631 Anemia in chronic kidney disease: Secondary | ICD-10-CM | POA: Diagnosis not present

## 2022-05-25 DIAGNOSIS — N2581 Secondary hyperparathyroidism of renal origin: Secondary | ICD-10-CM | POA: Diagnosis not present

## 2022-05-25 DIAGNOSIS — N186 End stage renal disease: Secondary | ICD-10-CM | POA: Diagnosis not present

## 2022-05-26 DIAGNOSIS — D631 Anemia in chronic kidney disease: Secondary | ICD-10-CM | POA: Diagnosis not present

## 2022-05-26 DIAGNOSIS — N2581 Secondary hyperparathyroidism of renal origin: Secondary | ICD-10-CM | POA: Diagnosis not present

## 2022-05-26 DIAGNOSIS — Z4932 Encounter for adequacy testing for peritoneal dialysis: Secondary | ICD-10-CM | POA: Diagnosis not present

## 2022-05-26 DIAGNOSIS — Z992 Dependence on renal dialysis: Secondary | ICD-10-CM | POA: Diagnosis not present

## 2022-05-26 DIAGNOSIS — N186 End stage renal disease: Secondary | ICD-10-CM | POA: Diagnosis not present

## 2022-05-27 DIAGNOSIS — N186 End stage renal disease: Secondary | ICD-10-CM | POA: Diagnosis not present

## 2022-05-27 DIAGNOSIS — N2581 Secondary hyperparathyroidism of renal origin: Secondary | ICD-10-CM | POA: Diagnosis not present

## 2022-05-27 DIAGNOSIS — Z992 Dependence on renal dialysis: Secondary | ICD-10-CM | POA: Diagnosis not present

## 2022-05-27 DIAGNOSIS — Z4932 Encounter for adequacy testing for peritoneal dialysis: Secondary | ICD-10-CM | POA: Diagnosis not present

## 2022-05-27 DIAGNOSIS — D631 Anemia in chronic kidney disease: Secondary | ICD-10-CM | POA: Diagnosis not present

## 2022-05-28 DIAGNOSIS — Z4932 Encounter for adequacy testing for peritoneal dialysis: Secondary | ICD-10-CM | POA: Diagnosis not present

## 2022-05-28 DIAGNOSIS — N2581 Secondary hyperparathyroidism of renal origin: Secondary | ICD-10-CM | POA: Diagnosis not present

## 2022-05-28 DIAGNOSIS — N186 End stage renal disease: Secondary | ICD-10-CM | POA: Diagnosis not present

## 2022-05-28 DIAGNOSIS — Z992 Dependence on renal dialysis: Secondary | ICD-10-CM | POA: Diagnosis not present

## 2022-05-28 DIAGNOSIS — D631 Anemia in chronic kidney disease: Secondary | ICD-10-CM | POA: Diagnosis not present

## 2022-05-29 DIAGNOSIS — Z992 Dependence on renal dialysis: Secondary | ICD-10-CM | POA: Diagnosis not present

## 2022-05-29 DIAGNOSIS — N186 End stage renal disease: Secondary | ICD-10-CM | POA: Diagnosis not present

## 2022-05-29 DIAGNOSIS — D631 Anemia in chronic kidney disease: Secondary | ICD-10-CM | POA: Diagnosis not present

## 2022-05-29 DIAGNOSIS — Z4932 Encounter for adequacy testing for peritoneal dialysis: Secondary | ICD-10-CM | POA: Diagnosis not present

## 2022-05-29 DIAGNOSIS — N2581 Secondary hyperparathyroidism of renal origin: Secondary | ICD-10-CM | POA: Diagnosis not present

## 2022-05-30 DIAGNOSIS — N186 End stage renal disease: Secondary | ICD-10-CM | POA: Diagnosis not present

## 2022-05-30 DIAGNOSIS — D631 Anemia in chronic kidney disease: Secondary | ICD-10-CM | POA: Diagnosis not present

## 2022-05-30 DIAGNOSIS — N2581 Secondary hyperparathyroidism of renal origin: Secondary | ICD-10-CM | POA: Diagnosis not present

## 2022-05-30 DIAGNOSIS — Z4932 Encounter for adequacy testing for peritoneal dialysis: Secondary | ICD-10-CM | POA: Diagnosis not present

## 2022-05-30 DIAGNOSIS — Z992 Dependence on renal dialysis: Secondary | ICD-10-CM | POA: Diagnosis not present

## 2022-05-31 DIAGNOSIS — N186 End stage renal disease: Secondary | ICD-10-CM | POA: Diagnosis not present

## 2022-05-31 DIAGNOSIS — D631 Anemia in chronic kidney disease: Secondary | ICD-10-CM | POA: Diagnosis not present

## 2022-05-31 DIAGNOSIS — N2581 Secondary hyperparathyroidism of renal origin: Secondary | ICD-10-CM | POA: Diagnosis not present

## 2022-05-31 DIAGNOSIS — Z992 Dependence on renal dialysis: Secondary | ICD-10-CM | POA: Diagnosis not present

## 2022-05-31 DIAGNOSIS — Z4932 Encounter for adequacy testing for peritoneal dialysis: Secondary | ICD-10-CM | POA: Diagnosis not present

## 2022-06-01 DIAGNOSIS — N186 End stage renal disease: Secondary | ICD-10-CM | POA: Diagnosis not present

## 2022-06-01 DIAGNOSIS — N2581 Secondary hyperparathyroidism of renal origin: Secondary | ICD-10-CM | POA: Diagnosis not present

## 2022-06-01 DIAGNOSIS — Z992 Dependence on renal dialysis: Secondary | ICD-10-CM | POA: Diagnosis not present

## 2022-06-01 DIAGNOSIS — Z4932 Encounter for adequacy testing for peritoneal dialysis: Secondary | ICD-10-CM | POA: Diagnosis not present

## 2022-06-01 DIAGNOSIS — D631 Anemia in chronic kidney disease: Secondary | ICD-10-CM | POA: Diagnosis not present

## 2022-06-02 DIAGNOSIS — Z992 Dependence on renal dialysis: Secondary | ICD-10-CM | POA: Diagnosis not present

## 2022-06-02 DIAGNOSIS — N2581 Secondary hyperparathyroidism of renal origin: Secondary | ICD-10-CM | POA: Diagnosis not present

## 2022-06-02 DIAGNOSIS — Z4932 Encounter for adequacy testing for peritoneal dialysis: Secondary | ICD-10-CM | POA: Diagnosis not present

## 2022-06-02 DIAGNOSIS — N186 End stage renal disease: Secondary | ICD-10-CM | POA: Diagnosis not present

## 2022-06-02 DIAGNOSIS — D631 Anemia in chronic kidney disease: Secondary | ICD-10-CM | POA: Diagnosis not present

## 2022-06-03 DIAGNOSIS — Z4932 Encounter for adequacy testing for peritoneal dialysis: Secondary | ICD-10-CM | POA: Diagnosis not present

## 2022-06-03 DIAGNOSIS — D631 Anemia in chronic kidney disease: Secondary | ICD-10-CM | POA: Diagnosis not present

## 2022-06-03 DIAGNOSIS — Z992 Dependence on renal dialysis: Secondary | ICD-10-CM | POA: Diagnosis not present

## 2022-06-03 DIAGNOSIS — N186 End stage renal disease: Secondary | ICD-10-CM | POA: Diagnosis not present

## 2022-06-03 DIAGNOSIS — N2581 Secondary hyperparathyroidism of renal origin: Secondary | ICD-10-CM | POA: Diagnosis not present

## 2022-06-04 DIAGNOSIS — D631 Anemia in chronic kidney disease: Secondary | ICD-10-CM | POA: Diagnosis not present

## 2022-06-04 DIAGNOSIS — Z992 Dependence on renal dialysis: Secondary | ICD-10-CM | POA: Diagnosis not present

## 2022-06-04 DIAGNOSIS — N2581 Secondary hyperparathyroidism of renal origin: Secondary | ICD-10-CM | POA: Diagnosis not present

## 2022-06-04 DIAGNOSIS — Z4932 Encounter for adequacy testing for peritoneal dialysis: Secondary | ICD-10-CM | POA: Diagnosis not present

## 2022-06-04 DIAGNOSIS — N186 End stage renal disease: Secondary | ICD-10-CM | POA: Diagnosis not present

## 2022-06-05 DIAGNOSIS — D631 Anemia in chronic kidney disease: Secondary | ICD-10-CM | POA: Diagnosis not present

## 2022-06-05 DIAGNOSIS — Z4932 Encounter for adequacy testing for peritoneal dialysis: Secondary | ICD-10-CM | POA: Diagnosis not present

## 2022-06-05 DIAGNOSIS — N186 End stage renal disease: Secondary | ICD-10-CM | POA: Diagnosis not present

## 2022-06-05 DIAGNOSIS — N2581 Secondary hyperparathyroidism of renal origin: Secondary | ICD-10-CM | POA: Diagnosis not present

## 2022-06-05 DIAGNOSIS — Z992 Dependence on renal dialysis: Secondary | ICD-10-CM | POA: Diagnosis not present

## 2022-06-06 DIAGNOSIS — N2581 Secondary hyperparathyroidism of renal origin: Secondary | ICD-10-CM | POA: Diagnosis not present

## 2022-06-06 DIAGNOSIS — Z992 Dependence on renal dialysis: Secondary | ICD-10-CM | POA: Diagnosis not present

## 2022-06-06 DIAGNOSIS — N186 End stage renal disease: Secondary | ICD-10-CM | POA: Diagnosis not present

## 2022-06-06 DIAGNOSIS — Z4932 Encounter for adequacy testing for peritoneal dialysis: Secondary | ICD-10-CM | POA: Diagnosis not present

## 2022-06-06 DIAGNOSIS — D631 Anemia in chronic kidney disease: Secondary | ICD-10-CM | POA: Diagnosis not present

## 2022-06-07 DIAGNOSIS — N2581 Secondary hyperparathyroidism of renal origin: Secondary | ICD-10-CM | POA: Diagnosis not present

## 2022-06-07 DIAGNOSIS — N186 End stage renal disease: Secondary | ICD-10-CM | POA: Diagnosis not present

## 2022-06-07 DIAGNOSIS — Z992 Dependence on renal dialysis: Secondary | ICD-10-CM | POA: Diagnosis not present

## 2022-06-07 DIAGNOSIS — Z4932 Encounter for adequacy testing for peritoneal dialysis: Secondary | ICD-10-CM | POA: Diagnosis not present

## 2022-06-07 DIAGNOSIS — D631 Anemia in chronic kidney disease: Secondary | ICD-10-CM | POA: Diagnosis not present

## 2022-06-08 DIAGNOSIS — Z4932 Encounter for adequacy testing for peritoneal dialysis: Secondary | ICD-10-CM | POA: Diagnosis not present

## 2022-06-08 DIAGNOSIS — D631 Anemia in chronic kidney disease: Secondary | ICD-10-CM | POA: Diagnosis not present

## 2022-06-08 DIAGNOSIS — Z992 Dependence on renal dialysis: Secondary | ICD-10-CM | POA: Diagnosis not present

## 2022-06-08 DIAGNOSIS — N186 End stage renal disease: Secondary | ICD-10-CM | POA: Diagnosis not present

## 2022-06-08 DIAGNOSIS — N2581 Secondary hyperparathyroidism of renal origin: Secondary | ICD-10-CM | POA: Diagnosis not present

## 2022-06-09 DIAGNOSIS — Z992 Dependence on renal dialysis: Secondary | ICD-10-CM | POA: Diagnosis not present

## 2022-06-09 DIAGNOSIS — D631 Anemia in chronic kidney disease: Secondary | ICD-10-CM | POA: Diagnosis not present

## 2022-06-09 DIAGNOSIS — N186 End stage renal disease: Secondary | ICD-10-CM | POA: Diagnosis not present

## 2022-06-09 DIAGNOSIS — E1122 Type 2 diabetes mellitus with diabetic chronic kidney disease: Secondary | ICD-10-CM | POA: Diagnosis not present

## 2022-06-09 DIAGNOSIS — I10 Essential (primary) hypertension: Secondary | ICD-10-CM | POA: Diagnosis not present

## 2022-06-09 DIAGNOSIS — Z4932 Encounter for adequacy testing for peritoneal dialysis: Secondary | ICD-10-CM | POA: Diagnosis not present

## 2022-06-09 DIAGNOSIS — Z125 Encounter for screening for malignant neoplasm of prostate: Secondary | ICD-10-CM | POA: Diagnosis not present

## 2022-06-09 DIAGNOSIS — N2581 Secondary hyperparathyroidism of renal origin: Secondary | ICD-10-CM | POA: Diagnosis not present

## 2022-06-10 DIAGNOSIS — N2581 Secondary hyperparathyroidism of renal origin: Secondary | ICD-10-CM | POA: Diagnosis not present

## 2022-06-10 DIAGNOSIS — N186 End stage renal disease: Secondary | ICD-10-CM | POA: Diagnosis not present

## 2022-06-10 DIAGNOSIS — Z4932 Encounter for adequacy testing for peritoneal dialysis: Secondary | ICD-10-CM | POA: Diagnosis not present

## 2022-06-10 DIAGNOSIS — Z992 Dependence on renal dialysis: Secondary | ICD-10-CM | POA: Diagnosis not present

## 2022-06-10 DIAGNOSIS — D631 Anemia in chronic kidney disease: Secondary | ICD-10-CM | POA: Diagnosis not present

## 2022-06-11 DIAGNOSIS — N2581 Secondary hyperparathyroidism of renal origin: Secondary | ICD-10-CM | POA: Diagnosis not present

## 2022-06-11 DIAGNOSIS — N186 End stage renal disease: Secondary | ICD-10-CM | POA: Diagnosis not present

## 2022-06-11 DIAGNOSIS — Z992 Dependence on renal dialysis: Secondary | ICD-10-CM | POA: Diagnosis not present

## 2022-06-11 DIAGNOSIS — D631 Anemia in chronic kidney disease: Secondary | ICD-10-CM | POA: Diagnosis not present

## 2022-06-11 DIAGNOSIS — Z4932 Encounter for adequacy testing for peritoneal dialysis: Secondary | ICD-10-CM | POA: Diagnosis not present

## 2022-06-12 DIAGNOSIS — N2581 Secondary hyperparathyroidism of renal origin: Secondary | ICD-10-CM | POA: Diagnosis not present

## 2022-06-12 DIAGNOSIS — N186 End stage renal disease: Secondary | ICD-10-CM | POA: Diagnosis not present

## 2022-06-12 DIAGNOSIS — Z4932 Encounter for adequacy testing for peritoneal dialysis: Secondary | ICD-10-CM | POA: Diagnosis not present

## 2022-06-12 DIAGNOSIS — Z992 Dependence on renal dialysis: Secondary | ICD-10-CM | POA: Diagnosis not present

## 2022-06-12 DIAGNOSIS — D631 Anemia in chronic kidney disease: Secondary | ICD-10-CM | POA: Diagnosis not present

## 2022-06-13 ENCOUNTER — Other Ambulatory Visit: Payer: Self-pay

## 2022-06-13 ENCOUNTER — Telehealth: Payer: Self-pay

## 2022-06-13 DIAGNOSIS — Z86010 Personal history of colonic polyps: Secondary | ICD-10-CM

## 2022-06-13 DIAGNOSIS — Z992 Dependence on renal dialysis: Secondary | ICD-10-CM | POA: Diagnosis not present

## 2022-06-13 DIAGNOSIS — D631 Anemia in chronic kidney disease: Secondary | ICD-10-CM | POA: Diagnosis not present

## 2022-06-13 DIAGNOSIS — N2581 Secondary hyperparathyroidism of renal origin: Secondary | ICD-10-CM | POA: Diagnosis not present

## 2022-06-13 DIAGNOSIS — N186 End stage renal disease: Secondary | ICD-10-CM | POA: Diagnosis not present

## 2022-06-13 DIAGNOSIS — Z4932 Encounter for adequacy testing for peritoneal dialysis: Secondary | ICD-10-CM | POA: Diagnosis not present

## 2022-06-13 NOTE — Telephone Encounter (Signed)
Spoke to patient to let him know Dr. [name] had time for his colonoscopy at [facility] on 08/11/2022.  I told him I would schedule it and then schedule a previsit.  He asked me to send him the information through [place].  I sent all the dates and times per his request.

## 2022-06-13 NOTE — Telephone Encounter (Signed)
-----  Message from Audrea Muscat, [place] sent at 04/19/2022  3:23 PM EST ----- Pt needs colon at [facility] in March

## 2022-06-14 DIAGNOSIS — D631 Anemia in chronic kidney disease: Secondary | ICD-10-CM | POA: Diagnosis not present

## 2022-06-14 DIAGNOSIS — N186 End stage renal disease: Secondary | ICD-10-CM | POA: Diagnosis not present

## 2022-06-14 DIAGNOSIS — Z4932 Encounter for adequacy testing for peritoneal dialysis: Secondary | ICD-10-CM | POA: Diagnosis not present

## 2022-06-14 DIAGNOSIS — N2581 Secondary hyperparathyroidism of renal origin: Secondary | ICD-10-CM | POA: Diagnosis not present

## 2022-06-14 DIAGNOSIS — Z992 Dependence on renal dialysis: Secondary | ICD-10-CM | POA: Diagnosis not present

## 2022-06-15 DIAGNOSIS — N186 End stage renal disease: Secondary | ICD-10-CM | POA: Diagnosis not present

## 2022-06-15 DIAGNOSIS — N2581 Secondary hyperparathyroidism of renal origin: Secondary | ICD-10-CM | POA: Diagnosis not present

## 2022-06-15 DIAGNOSIS — Z992 Dependence on renal dialysis: Secondary | ICD-10-CM | POA: Diagnosis not present

## 2022-06-15 DIAGNOSIS — Z4932 Encounter for adequacy testing for peritoneal dialysis: Secondary | ICD-10-CM | POA: Diagnosis not present

## 2022-06-15 DIAGNOSIS — E1122 Type 2 diabetes mellitus with diabetic chronic kidney disease: Secondary | ICD-10-CM | POA: Diagnosis not present

## 2022-06-15 DIAGNOSIS — D631 Anemia in chronic kidney disease: Secondary | ICD-10-CM | POA: Diagnosis not present

## 2022-06-16 DIAGNOSIS — Z992 Dependence on renal dialysis: Secondary | ICD-10-CM | POA: Diagnosis not present

## 2022-06-16 DIAGNOSIS — Z4932 Encounter for adequacy testing for peritoneal dialysis: Secondary | ICD-10-CM | POA: Diagnosis not present

## 2022-06-16 DIAGNOSIS — D631 Anemia in chronic kidney disease: Secondary | ICD-10-CM | POA: Diagnosis not present

## 2022-06-16 DIAGNOSIS — N186 End stage renal disease: Secondary | ICD-10-CM | POA: Diagnosis not present

## 2022-06-16 DIAGNOSIS — N2581 Secondary hyperparathyroidism of renal origin: Secondary | ICD-10-CM | POA: Diagnosis not present

## 2022-06-17 DIAGNOSIS — Z992 Dependence on renal dialysis: Secondary | ICD-10-CM | POA: Diagnosis not present

## 2022-06-17 DIAGNOSIS — N186 End stage renal disease: Secondary | ICD-10-CM | POA: Diagnosis not present

## 2022-06-17 DIAGNOSIS — Z4932 Encounter for adequacy testing for peritoneal dialysis: Secondary | ICD-10-CM | POA: Diagnosis not present

## 2022-06-17 DIAGNOSIS — D631 Anemia in chronic kidney disease: Secondary | ICD-10-CM | POA: Diagnosis not present

## 2022-06-17 DIAGNOSIS — N2581 Secondary hyperparathyroidism of renal origin: Secondary | ICD-10-CM | POA: Diagnosis not present

## 2022-06-18 DIAGNOSIS — Z992 Dependence on renal dialysis: Secondary | ICD-10-CM | POA: Diagnosis not present

## 2022-06-18 DIAGNOSIS — D631 Anemia in chronic kidney disease: Secondary | ICD-10-CM | POA: Diagnosis not present

## 2022-06-18 DIAGNOSIS — N2581 Secondary hyperparathyroidism of renal origin: Secondary | ICD-10-CM | POA: Diagnosis not present

## 2022-06-18 DIAGNOSIS — N186 End stage renal disease: Secondary | ICD-10-CM | POA: Diagnosis not present

## 2022-06-18 DIAGNOSIS — Z4932 Encounter for adequacy testing for peritoneal dialysis: Secondary | ICD-10-CM | POA: Diagnosis not present

## 2022-06-19 DIAGNOSIS — Z992 Dependence on renal dialysis: Secondary | ICD-10-CM | POA: Diagnosis not present

## 2022-06-19 DIAGNOSIS — N2581 Secondary hyperparathyroidism of renal origin: Secondary | ICD-10-CM | POA: Diagnosis not present

## 2022-06-19 DIAGNOSIS — Z4932 Encounter for adequacy testing for peritoneal dialysis: Secondary | ICD-10-CM | POA: Diagnosis not present

## 2022-06-19 DIAGNOSIS — D631 Anemia in chronic kidney disease: Secondary | ICD-10-CM | POA: Diagnosis not present

## 2022-06-19 DIAGNOSIS — N186 End stage renal disease: Secondary | ICD-10-CM | POA: Diagnosis not present

## 2022-06-20 DIAGNOSIS — N186 End stage renal disease: Secondary | ICD-10-CM | POA: Diagnosis not present

## 2022-06-20 DIAGNOSIS — Z992 Dependence on renal dialysis: Secondary | ICD-10-CM | POA: Diagnosis not present

## 2022-06-20 DIAGNOSIS — Z4932 Encounter for adequacy testing for peritoneal dialysis: Secondary | ICD-10-CM | POA: Diagnosis not present

## 2022-06-20 DIAGNOSIS — D631 Anemia in chronic kidney disease: Secondary | ICD-10-CM | POA: Diagnosis not present

## 2022-06-20 DIAGNOSIS — N2581 Secondary hyperparathyroidism of renal origin: Secondary | ICD-10-CM | POA: Diagnosis not present

## 2022-06-21 DIAGNOSIS — N186 End stage renal disease: Secondary | ICD-10-CM | POA: Diagnosis not present

## 2022-06-21 DIAGNOSIS — Z992 Dependence on renal dialysis: Secondary | ICD-10-CM | POA: Diagnosis not present

## 2022-06-21 DIAGNOSIS — D631 Anemia in chronic kidney disease: Secondary | ICD-10-CM | POA: Diagnosis not present

## 2022-06-21 DIAGNOSIS — N2581 Secondary hyperparathyroidism of renal origin: Secondary | ICD-10-CM | POA: Diagnosis not present

## 2022-06-21 DIAGNOSIS — Z4932 Encounter for adequacy testing for peritoneal dialysis: Secondary | ICD-10-CM | POA: Diagnosis not present

## 2022-06-22 DIAGNOSIS — Z4932 Encounter for adequacy testing for peritoneal dialysis: Secondary | ICD-10-CM | POA: Diagnosis not present

## 2022-06-22 DIAGNOSIS — N2581 Secondary hyperparathyroidism of renal origin: Secondary | ICD-10-CM | POA: Diagnosis not present

## 2022-06-22 DIAGNOSIS — Z992 Dependence on renal dialysis: Secondary | ICD-10-CM | POA: Diagnosis not present

## 2022-06-22 DIAGNOSIS — N186 End stage renal disease: Secondary | ICD-10-CM | POA: Diagnosis not present

## 2022-06-22 DIAGNOSIS — D631 Anemia in chronic kidney disease: Secondary | ICD-10-CM | POA: Diagnosis not present

## 2022-06-23 DIAGNOSIS — D631 Anemia in chronic kidney disease: Secondary | ICD-10-CM | POA: Diagnosis not present

## 2022-06-23 DIAGNOSIS — N2581 Secondary hyperparathyroidism of renal origin: Secondary | ICD-10-CM | POA: Diagnosis not present

## 2022-06-23 DIAGNOSIS — N186 End stage renal disease: Secondary | ICD-10-CM | POA: Diagnosis not present

## 2022-06-23 DIAGNOSIS — Z992 Dependence on renal dialysis: Secondary | ICD-10-CM | POA: Diagnosis not present

## 2022-06-23 DIAGNOSIS — Z4932 Encounter for adequacy testing for peritoneal dialysis: Secondary | ICD-10-CM | POA: Diagnosis not present

## 2022-06-24 DIAGNOSIS — Z4932 Encounter for adequacy testing for peritoneal dialysis: Secondary | ICD-10-CM | POA: Diagnosis not present

## 2022-06-24 DIAGNOSIS — N186 End stage renal disease: Secondary | ICD-10-CM | POA: Diagnosis not present

## 2022-06-24 DIAGNOSIS — Z992 Dependence on renal dialysis: Secondary | ICD-10-CM | POA: Diagnosis not present

## 2022-06-24 DIAGNOSIS — D631 Anemia in chronic kidney disease: Secondary | ICD-10-CM | POA: Diagnosis not present

## 2022-06-24 DIAGNOSIS — N2581 Secondary hyperparathyroidism of renal origin: Secondary | ICD-10-CM | POA: Diagnosis not present

## 2022-06-25 DIAGNOSIS — D631 Anemia in chronic kidney disease: Secondary | ICD-10-CM | POA: Diagnosis not present

## 2022-06-25 DIAGNOSIS — Z4932 Encounter for adequacy testing for peritoneal dialysis: Secondary | ICD-10-CM | POA: Diagnosis not present

## 2022-06-25 DIAGNOSIS — Z992 Dependence on renal dialysis: Secondary | ICD-10-CM | POA: Diagnosis not present

## 2022-06-25 DIAGNOSIS — N186 End stage renal disease: Secondary | ICD-10-CM | POA: Diagnosis not present

## 2022-06-25 DIAGNOSIS — N2581 Secondary hyperparathyroidism of renal origin: Secondary | ICD-10-CM | POA: Diagnosis not present

## 2022-06-26 DIAGNOSIS — Z4932 Encounter for adequacy testing for peritoneal dialysis: Secondary | ICD-10-CM | POA: Diagnosis not present

## 2022-06-26 DIAGNOSIS — N2581 Secondary hyperparathyroidism of renal origin: Secondary | ICD-10-CM | POA: Diagnosis not present

## 2022-06-26 DIAGNOSIS — D631 Anemia in chronic kidney disease: Secondary | ICD-10-CM | POA: Diagnosis not present

## 2022-06-26 DIAGNOSIS — N186 End stage renal disease: Secondary | ICD-10-CM | POA: Diagnosis not present

## 2022-06-26 DIAGNOSIS — Z992 Dependence on renal dialysis: Secondary | ICD-10-CM | POA: Diagnosis not present

## 2022-06-27 DIAGNOSIS — Z4932 Encounter for adequacy testing for peritoneal dialysis: Secondary | ICD-10-CM | POA: Diagnosis not present

## 2022-06-27 DIAGNOSIS — D631 Anemia in chronic kidney disease: Secondary | ICD-10-CM | POA: Diagnosis not present

## 2022-06-27 DIAGNOSIS — N186 End stage renal disease: Secondary | ICD-10-CM | POA: Diagnosis not present

## 2022-06-27 DIAGNOSIS — N2581 Secondary hyperparathyroidism of renal origin: Secondary | ICD-10-CM | POA: Diagnosis not present

## 2022-06-27 DIAGNOSIS — Z992 Dependence on renal dialysis: Secondary | ICD-10-CM | POA: Diagnosis not present

## 2022-06-28 DIAGNOSIS — Z4932 Encounter for adequacy testing for peritoneal dialysis: Secondary | ICD-10-CM | POA: Diagnosis not present

## 2022-06-28 DIAGNOSIS — D631 Anemia in chronic kidney disease: Secondary | ICD-10-CM | POA: Diagnosis not present

## 2022-06-28 DIAGNOSIS — N2581 Secondary hyperparathyroidism of renal origin: Secondary | ICD-10-CM | POA: Diagnosis not present

## 2022-06-28 DIAGNOSIS — Z992 Dependence on renal dialysis: Secondary | ICD-10-CM | POA: Diagnosis not present

## 2022-06-28 DIAGNOSIS — N186 End stage renal disease: Secondary | ICD-10-CM | POA: Diagnosis not present

## 2022-06-29 DIAGNOSIS — D631 Anemia in chronic kidney disease: Secondary | ICD-10-CM | POA: Diagnosis not present

## 2022-06-29 DIAGNOSIS — N2581 Secondary hyperparathyroidism of renal origin: Secondary | ICD-10-CM | POA: Diagnosis not present

## 2022-06-29 DIAGNOSIS — Z992 Dependence on renal dialysis: Secondary | ICD-10-CM | POA: Diagnosis not present

## 2022-06-29 DIAGNOSIS — N186 End stage renal disease: Secondary | ICD-10-CM | POA: Diagnosis not present

## 2022-06-29 DIAGNOSIS — Z4932 Encounter for adequacy testing for peritoneal dialysis: Secondary | ICD-10-CM | POA: Diagnosis not present

## 2022-06-30 DIAGNOSIS — Z4932 Encounter for adequacy testing for peritoneal dialysis: Secondary | ICD-10-CM | POA: Diagnosis not present

## 2022-06-30 DIAGNOSIS — N186 End stage renal disease: Secondary | ICD-10-CM | POA: Diagnosis not present

## 2022-06-30 DIAGNOSIS — D631 Anemia in chronic kidney disease: Secondary | ICD-10-CM | POA: Diagnosis not present

## 2022-06-30 DIAGNOSIS — Z992 Dependence on renal dialysis: Secondary | ICD-10-CM | POA: Diagnosis not present

## 2022-06-30 DIAGNOSIS — N2581 Secondary hyperparathyroidism of renal origin: Secondary | ICD-10-CM | POA: Diagnosis not present

## 2022-07-01 ENCOUNTER — Telehealth: Payer: Self-pay

## 2022-07-01 ENCOUNTER — Encounter (HOSPITAL_BASED_OUTPATIENT_CLINIC_OR_DEPARTMENT_OTHER): Payer: Self-pay | Admitting: Cardiology

## 2022-07-01 DIAGNOSIS — D631 Anemia in chronic kidney disease: Secondary | ICD-10-CM | POA: Diagnosis not present

## 2022-07-01 DIAGNOSIS — Z992 Dependence on renal dialysis: Secondary | ICD-10-CM | POA: Diagnosis not present

## 2022-07-01 DIAGNOSIS — N2581 Secondary hyperparathyroidism of renal origin: Secondary | ICD-10-CM | POA: Diagnosis not present

## 2022-07-01 DIAGNOSIS — N186 End stage renal disease: Secondary | ICD-10-CM | POA: Diagnosis not present

## 2022-07-01 DIAGNOSIS — Z4932 Encounter for adequacy testing for peritoneal dialysis: Secondary | ICD-10-CM | POA: Diagnosis not present

## 2022-07-01 NOTE — Telephone Encounter (Signed)
Called patient to check on his amlodipine dosage as records indicate he should not be out of medication at this time. He states he mistakenly thought he was out but has found his other bottles. Verified dosage. Patient confirms he has a 2 month supply on hand of 2.5 mg amlodipine.

## 2022-07-02 DIAGNOSIS — Z4932 Encounter for adequacy testing for peritoneal dialysis: Secondary | ICD-10-CM | POA: Diagnosis not present

## 2022-07-02 DIAGNOSIS — Z992 Dependence on renal dialysis: Secondary | ICD-10-CM | POA: Diagnosis not present

## 2022-07-02 DIAGNOSIS — N186 End stage renal disease: Secondary | ICD-10-CM | POA: Diagnosis not present

## 2022-07-02 DIAGNOSIS — D631 Anemia in chronic kidney disease: Secondary | ICD-10-CM | POA: Diagnosis not present

## 2022-07-02 DIAGNOSIS — N2581 Secondary hyperparathyroidism of renal origin: Secondary | ICD-10-CM | POA: Diagnosis not present

## 2022-07-03 DIAGNOSIS — D631 Anemia in chronic kidney disease: Secondary | ICD-10-CM | POA: Diagnosis not present

## 2022-07-03 DIAGNOSIS — Z992 Dependence on renal dialysis: Secondary | ICD-10-CM | POA: Diagnosis not present

## 2022-07-03 DIAGNOSIS — N2581 Secondary hyperparathyroidism of renal origin: Secondary | ICD-10-CM | POA: Diagnosis not present

## 2022-07-03 DIAGNOSIS — Z4932 Encounter for adequacy testing for peritoneal dialysis: Secondary | ICD-10-CM | POA: Diagnosis not present

## 2022-07-03 DIAGNOSIS — N186 End stage renal disease: Secondary | ICD-10-CM | POA: Diagnosis not present

## 2022-07-04 DIAGNOSIS — Z992 Dependence on renal dialysis: Secondary | ICD-10-CM | POA: Diagnosis not present

## 2022-07-04 DIAGNOSIS — Z4932 Encounter for adequacy testing for peritoneal dialysis: Secondary | ICD-10-CM | POA: Diagnosis not present

## 2022-07-04 DIAGNOSIS — D631 Anemia in chronic kidney disease: Secondary | ICD-10-CM | POA: Diagnosis not present

## 2022-07-04 DIAGNOSIS — N2581 Secondary hyperparathyroidism of renal origin: Secondary | ICD-10-CM | POA: Diagnosis not present

## 2022-07-04 DIAGNOSIS — N186 End stage renal disease: Secondary | ICD-10-CM | POA: Diagnosis not present

## 2022-07-05 DIAGNOSIS — N2581 Secondary hyperparathyroidism of renal origin: Secondary | ICD-10-CM | POA: Diagnosis not present

## 2022-07-05 DIAGNOSIS — N186 End stage renal disease: Secondary | ICD-10-CM | POA: Diagnosis not present

## 2022-07-05 DIAGNOSIS — D631 Anemia in chronic kidney disease: Secondary | ICD-10-CM | POA: Diagnosis not present

## 2022-07-05 DIAGNOSIS — Z4932 Encounter for adequacy testing for peritoneal dialysis: Secondary | ICD-10-CM | POA: Diagnosis not present

## 2022-07-05 DIAGNOSIS — Z992 Dependence on renal dialysis: Secondary | ICD-10-CM | POA: Diagnosis not present

## 2022-07-06 DIAGNOSIS — D631 Anemia in chronic kidney disease: Secondary | ICD-10-CM | POA: Diagnosis not present

## 2022-07-06 DIAGNOSIS — N186 End stage renal disease: Secondary | ICD-10-CM | POA: Diagnosis not present

## 2022-07-06 DIAGNOSIS — Z992 Dependence on renal dialysis: Secondary | ICD-10-CM | POA: Diagnosis not present

## 2022-07-06 DIAGNOSIS — Z4932 Encounter for adequacy testing for peritoneal dialysis: Secondary | ICD-10-CM | POA: Diagnosis not present

## 2022-07-06 DIAGNOSIS — N2581 Secondary hyperparathyroidism of renal origin: Secondary | ICD-10-CM | POA: Diagnosis not present

## 2022-07-07 DIAGNOSIS — Z4932 Encounter for adequacy testing for peritoneal dialysis: Secondary | ICD-10-CM | POA: Diagnosis not present

## 2022-07-07 DIAGNOSIS — D631 Anemia in chronic kidney disease: Secondary | ICD-10-CM | POA: Diagnosis not present

## 2022-07-07 DIAGNOSIS — N186 End stage renal disease: Secondary | ICD-10-CM | POA: Diagnosis not present

## 2022-07-07 DIAGNOSIS — N2581 Secondary hyperparathyroidism of renal origin: Secondary | ICD-10-CM | POA: Diagnosis not present

## 2022-07-07 DIAGNOSIS — Z992 Dependence on renal dialysis: Secondary | ICD-10-CM | POA: Diagnosis not present

## 2022-07-08 DIAGNOSIS — Z992 Dependence on renal dialysis: Secondary | ICD-10-CM | POA: Diagnosis not present

## 2022-07-08 DIAGNOSIS — N2581 Secondary hyperparathyroidism of renal origin: Secondary | ICD-10-CM | POA: Diagnosis not present

## 2022-07-08 DIAGNOSIS — N186 End stage renal disease: Secondary | ICD-10-CM | POA: Diagnosis not present

## 2022-07-08 DIAGNOSIS — D631 Anemia in chronic kidney disease: Secondary | ICD-10-CM | POA: Diagnosis not present

## 2022-07-08 DIAGNOSIS — Z4932 Encounter for adequacy testing for peritoneal dialysis: Secondary | ICD-10-CM | POA: Diagnosis not present

## 2022-07-09 DIAGNOSIS — Z992 Dependence on renal dialysis: Secondary | ICD-10-CM | POA: Diagnosis not present

## 2022-07-09 DIAGNOSIS — N186 End stage renal disease: Secondary | ICD-10-CM | POA: Diagnosis not present

## 2022-07-09 DIAGNOSIS — D631 Anemia in chronic kidney disease: Secondary | ICD-10-CM | POA: Diagnosis not present

## 2022-07-09 DIAGNOSIS — Z4932 Encounter for adequacy testing for peritoneal dialysis: Secondary | ICD-10-CM | POA: Diagnosis not present

## 2022-07-09 DIAGNOSIS — N2581 Secondary hyperparathyroidism of renal origin: Secondary | ICD-10-CM | POA: Diagnosis not present

## 2022-07-10 DIAGNOSIS — D631 Anemia in chronic kidney disease: Secondary | ICD-10-CM | POA: Diagnosis not present

## 2022-07-10 DIAGNOSIS — Z4932 Encounter for adequacy testing for peritoneal dialysis: Secondary | ICD-10-CM | POA: Diagnosis not present

## 2022-07-10 DIAGNOSIS — N186 End stage renal disease: Secondary | ICD-10-CM | POA: Diagnosis not present

## 2022-07-10 DIAGNOSIS — N2581 Secondary hyperparathyroidism of renal origin: Secondary | ICD-10-CM | POA: Diagnosis not present

## 2022-07-10 DIAGNOSIS — Z992 Dependence on renal dialysis: Secondary | ICD-10-CM | POA: Diagnosis not present

## 2022-07-11 ENCOUNTER — Ambulatory Visit
Admission: RE | Admit: 2022-07-11 | Discharge: 2022-07-11 | Disposition: A | Discharge status: Home / Self Care | Payer: Medicare Other | Source: Ambulatory Visit | Attending: Physician Assistant | Admitting: Physician Assistant

## 2022-07-11 ENCOUNTER — Telehealth: Payer: Self-pay | Admitting: Cardiology

## 2022-07-11 ENCOUNTER — Other Ambulatory Visit: Payer: Self-pay | Admitting: Physician Assistant

## 2022-07-11 ENCOUNTER — Encounter (HOSPITAL_BASED_OUTPATIENT_CLINIC_OR_DEPARTMENT_OTHER): Payer: Self-pay | Admitting: Cardiology

## 2022-07-11 DIAGNOSIS — N2581 Secondary hyperparathyroidism of renal origin: Secondary | ICD-10-CM | POA: Diagnosis not present

## 2022-07-11 DIAGNOSIS — R051 Acute cough: Secondary | ICD-10-CM | POA: Diagnosis not present

## 2022-07-11 DIAGNOSIS — R0789 Other chest pain: Secondary | ICD-10-CM

## 2022-07-11 DIAGNOSIS — D631 Anemia in chronic kidney disease: Secondary | ICD-10-CM | POA: Diagnosis not present

## 2022-07-11 DIAGNOSIS — N186 End stage renal disease: Secondary | ICD-10-CM | POA: Diagnosis not present

## 2022-07-11 DIAGNOSIS — I10 Essential (primary) hypertension: Secondary | ICD-10-CM | POA: Diagnosis not present

## 2022-07-11 DIAGNOSIS — R0989 Other specified symptoms and signs involving the circulatory and respiratory systems: Secondary | ICD-10-CM | POA: Diagnosis not present

## 2022-07-11 DIAGNOSIS — Z992 Dependence on renal dialysis: Secondary | ICD-10-CM | POA: Diagnosis not present

## 2022-07-11 DIAGNOSIS — Z4932 Encounter for adequacy testing for peritoneal dialysis: Secondary | ICD-10-CM | POA: Diagnosis not present

## 2022-07-11 DIAGNOSIS — J9811 Atelectasis: Secondary | ICD-10-CM | POA: Diagnosis not present

## 2022-07-11 NOTE — Telephone Encounter (Signed)
Patient calling in about getting new equipment for his cpap machine. Please advise

## 2022-07-12 DIAGNOSIS — N2581 Secondary hyperparathyroidism of renal origin: Secondary | ICD-10-CM | POA: Diagnosis not present

## 2022-07-12 DIAGNOSIS — N186 End stage renal disease: Secondary | ICD-10-CM | POA: Diagnosis not present

## 2022-07-12 DIAGNOSIS — Z992 Dependence on renal dialysis: Secondary | ICD-10-CM | POA: Diagnosis not present

## 2022-07-12 DIAGNOSIS — D631 Anemia in chronic kidney disease: Secondary | ICD-10-CM | POA: Diagnosis not present

## 2022-07-12 DIAGNOSIS — Z4932 Encounter for adequacy testing for peritoneal dialysis: Secondary | ICD-10-CM | POA: Diagnosis not present

## 2022-07-13 DIAGNOSIS — Z4932 Encounter for adequacy testing for peritoneal dialysis: Secondary | ICD-10-CM | POA: Diagnosis not present

## 2022-07-13 DIAGNOSIS — N186 End stage renal disease: Secondary | ICD-10-CM | POA: Diagnosis not present

## 2022-07-13 DIAGNOSIS — I10 Essential (primary) hypertension: Secondary | ICD-10-CM | POA: Diagnosis not present

## 2022-07-13 DIAGNOSIS — Z992 Dependence on renal dialysis: Secondary | ICD-10-CM | POA: Diagnosis not present

## 2022-07-13 DIAGNOSIS — D631 Anemia in chronic kidney disease: Secondary | ICD-10-CM | POA: Diagnosis not present

## 2022-07-13 DIAGNOSIS — N2581 Secondary hyperparathyroidism of renal origin: Secondary | ICD-10-CM | POA: Diagnosis not present

## 2022-07-13 DIAGNOSIS — G4733 Obstructive sleep apnea (adult) (pediatric): Secondary | ICD-10-CM | POA: Diagnosis not present

## 2022-07-14 DIAGNOSIS — D631 Anemia in chronic kidney disease: Secondary | ICD-10-CM | POA: Diagnosis not present

## 2022-07-14 DIAGNOSIS — N2581 Secondary hyperparathyroidism of renal origin: Secondary | ICD-10-CM | POA: Diagnosis not present

## 2022-07-14 DIAGNOSIS — N186 End stage renal disease: Secondary | ICD-10-CM | POA: Diagnosis not present

## 2022-07-14 DIAGNOSIS — E1122 Type 2 diabetes mellitus with diabetic chronic kidney disease: Secondary | ICD-10-CM | POA: Diagnosis not present

## 2022-07-14 DIAGNOSIS — Z4932 Encounter for adequacy testing for peritoneal dialysis: Secondary | ICD-10-CM | POA: Diagnosis not present

## 2022-07-14 DIAGNOSIS — Z992 Dependence on renal dialysis: Secondary | ICD-10-CM | POA: Diagnosis not present

## 2022-07-15 DIAGNOSIS — N2581 Secondary hyperparathyroidism of renal origin: Secondary | ICD-10-CM | POA: Diagnosis not present

## 2022-07-15 DIAGNOSIS — N186 End stage renal disease: Secondary | ICD-10-CM | POA: Diagnosis not present

## 2022-07-15 DIAGNOSIS — Z992 Dependence on renal dialysis: Secondary | ICD-10-CM | POA: Diagnosis not present

## 2022-07-15 DIAGNOSIS — D631 Anemia in chronic kidney disease: Secondary | ICD-10-CM | POA: Diagnosis not present

## 2022-07-15 NOTE — Telephone Encounter (Signed)
Reached out to patient and call was completed.

## 2022-07-16 DIAGNOSIS — Z992 Dependence on renal dialysis: Secondary | ICD-10-CM | POA: Diagnosis not present

## 2022-07-16 DIAGNOSIS — N186 End stage renal disease: Secondary | ICD-10-CM | POA: Diagnosis not present

## 2022-07-16 DIAGNOSIS — D631 Anemia in chronic kidney disease: Secondary | ICD-10-CM | POA: Diagnosis not present

## 2022-07-16 DIAGNOSIS — N2581 Secondary hyperparathyroidism of renal origin: Secondary | ICD-10-CM | POA: Diagnosis not present

## 2022-07-17 DIAGNOSIS — Z992 Dependence on renal dialysis: Secondary | ICD-10-CM | POA: Diagnosis not present

## 2022-07-17 DIAGNOSIS — N2581 Secondary hyperparathyroidism of renal origin: Secondary | ICD-10-CM | POA: Diagnosis not present

## 2022-07-17 DIAGNOSIS — N186 End stage renal disease: Secondary | ICD-10-CM | POA: Diagnosis not present

## 2022-07-17 DIAGNOSIS — D631 Anemia in chronic kidney disease: Secondary | ICD-10-CM | POA: Diagnosis not present

## 2022-07-18 DIAGNOSIS — D631 Anemia in chronic kidney disease: Secondary | ICD-10-CM | POA: Diagnosis not present

## 2022-07-18 DIAGNOSIS — N186 End stage renal disease: Secondary | ICD-10-CM | POA: Diagnosis not present

## 2022-07-18 DIAGNOSIS — N2581 Secondary hyperparathyroidism of renal origin: Secondary | ICD-10-CM | POA: Diagnosis not present

## 2022-07-18 DIAGNOSIS — Z992 Dependence on renal dialysis: Secondary | ICD-10-CM | POA: Diagnosis not present

## 2022-07-19 DIAGNOSIS — N186 End stage renal disease: Secondary | ICD-10-CM | POA: Diagnosis not present

## 2022-07-19 DIAGNOSIS — N2581 Secondary hyperparathyroidism of renal origin: Secondary | ICD-10-CM | POA: Diagnosis not present

## 2022-07-19 DIAGNOSIS — D631 Anemia in chronic kidney disease: Secondary | ICD-10-CM | POA: Diagnosis not present

## 2022-07-19 DIAGNOSIS — Z992 Dependence on renal dialysis: Secondary | ICD-10-CM | POA: Diagnosis not present

## 2022-07-20 DIAGNOSIS — N186 End stage renal disease: Secondary | ICD-10-CM | POA: Diagnosis not present

## 2022-07-20 DIAGNOSIS — N2581 Secondary hyperparathyroidism of renal origin: Secondary | ICD-10-CM | POA: Diagnosis not present

## 2022-07-20 DIAGNOSIS — D631 Anemia in chronic kidney disease: Secondary | ICD-10-CM | POA: Diagnosis not present

## 2022-07-20 DIAGNOSIS — Z992 Dependence on renal dialysis: Secondary | ICD-10-CM | POA: Diagnosis not present

## 2022-07-21 DIAGNOSIS — D631 Anemia in chronic kidney disease: Secondary | ICD-10-CM | POA: Diagnosis not present

## 2022-07-21 DIAGNOSIS — N186 End stage renal disease: Secondary | ICD-10-CM | POA: Diagnosis not present

## 2022-07-21 DIAGNOSIS — Z992 Dependence on renal dialysis: Secondary | ICD-10-CM | POA: Diagnosis not present

## 2022-07-21 DIAGNOSIS — N2581 Secondary hyperparathyroidism of renal origin: Secondary | ICD-10-CM | POA: Diagnosis not present

## 2022-07-22 DIAGNOSIS — Z992 Dependence on renal dialysis: Secondary | ICD-10-CM | POA: Diagnosis not present

## 2022-07-22 DIAGNOSIS — N186 End stage renal disease: Secondary | ICD-10-CM | POA: Diagnosis not present

## 2022-07-22 DIAGNOSIS — D631 Anemia in chronic kidney disease: Secondary | ICD-10-CM | POA: Diagnosis not present

## 2022-07-22 DIAGNOSIS — N2581 Secondary hyperparathyroidism of renal origin: Secondary | ICD-10-CM | POA: Diagnosis not present

## 2022-07-23 DIAGNOSIS — N2581 Secondary hyperparathyroidism of renal origin: Secondary | ICD-10-CM | POA: Diagnosis not present

## 2022-07-23 DIAGNOSIS — Z992 Dependence on renal dialysis: Secondary | ICD-10-CM | POA: Diagnosis not present

## 2022-07-23 DIAGNOSIS — N186 End stage renal disease: Secondary | ICD-10-CM | POA: Diagnosis not present

## 2022-07-23 DIAGNOSIS — D631 Anemia in chronic kidney disease: Secondary | ICD-10-CM | POA: Diagnosis not present

## 2022-07-24 DIAGNOSIS — Z992 Dependence on renal dialysis: Secondary | ICD-10-CM | POA: Diagnosis not present

## 2022-07-24 DIAGNOSIS — N2581 Secondary hyperparathyroidism of renal origin: Secondary | ICD-10-CM | POA: Diagnosis not present

## 2022-07-24 DIAGNOSIS — N186 End stage renal disease: Secondary | ICD-10-CM | POA: Diagnosis not present

## 2022-07-24 DIAGNOSIS — D631 Anemia in chronic kidney disease: Secondary | ICD-10-CM | POA: Diagnosis not present

## 2022-07-25 DIAGNOSIS — N186 End stage renal disease: Secondary | ICD-10-CM | POA: Diagnosis not present

## 2022-07-25 DIAGNOSIS — D631 Anemia in chronic kidney disease: Secondary | ICD-10-CM | POA: Diagnosis not present

## 2022-07-25 DIAGNOSIS — R058 Other specified cough: Secondary | ICD-10-CM | POA: Diagnosis not present

## 2022-07-25 DIAGNOSIS — Z992 Dependence on renal dialysis: Secondary | ICD-10-CM | POA: Diagnosis not present

## 2022-07-25 DIAGNOSIS — N2581 Secondary hyperparathyroidism of renal origin: Secondary | ICD-10-CM | POA: Diagnosis not present

## 2022-07-26 ENCOUNTER — Telehealth: Payer: Self-pay | Admitting: *Deleted

## 2022-07-26 DIAGNOSIS — N2581 Secondary hyperparathyroidism of renal origin: Secondary | ICD-10-CM | POA: Diagnosis not present

## 2022-07-26 DIAGNOSIS — N186 End stage renal disease: Secondary | ICD-10-CM | POA: Diagnosis not present

## 2022-07-26 DIAGNOSIS — Z992 Dependence on renal dialysis: Secondary | ICD-10-CM | POA: Diagnosis not present

## 2022-07-26 DIAGNOSIS — D631 Anemia in chronic kidney disease: Secondary | ICD-10-CM | POA: Diagnosis not present

## 2022-07-26 IMAGING — DX DG CHEST 2V
2 series · 2 of 2 positions shown · non-contrast
Comparison: Chest radiograph 11/03/2017

CLINICAL DATA: Acute cough.

EXAM:
CHEST - 2 VIEW

[dg chest 2 view (1 of 2)]
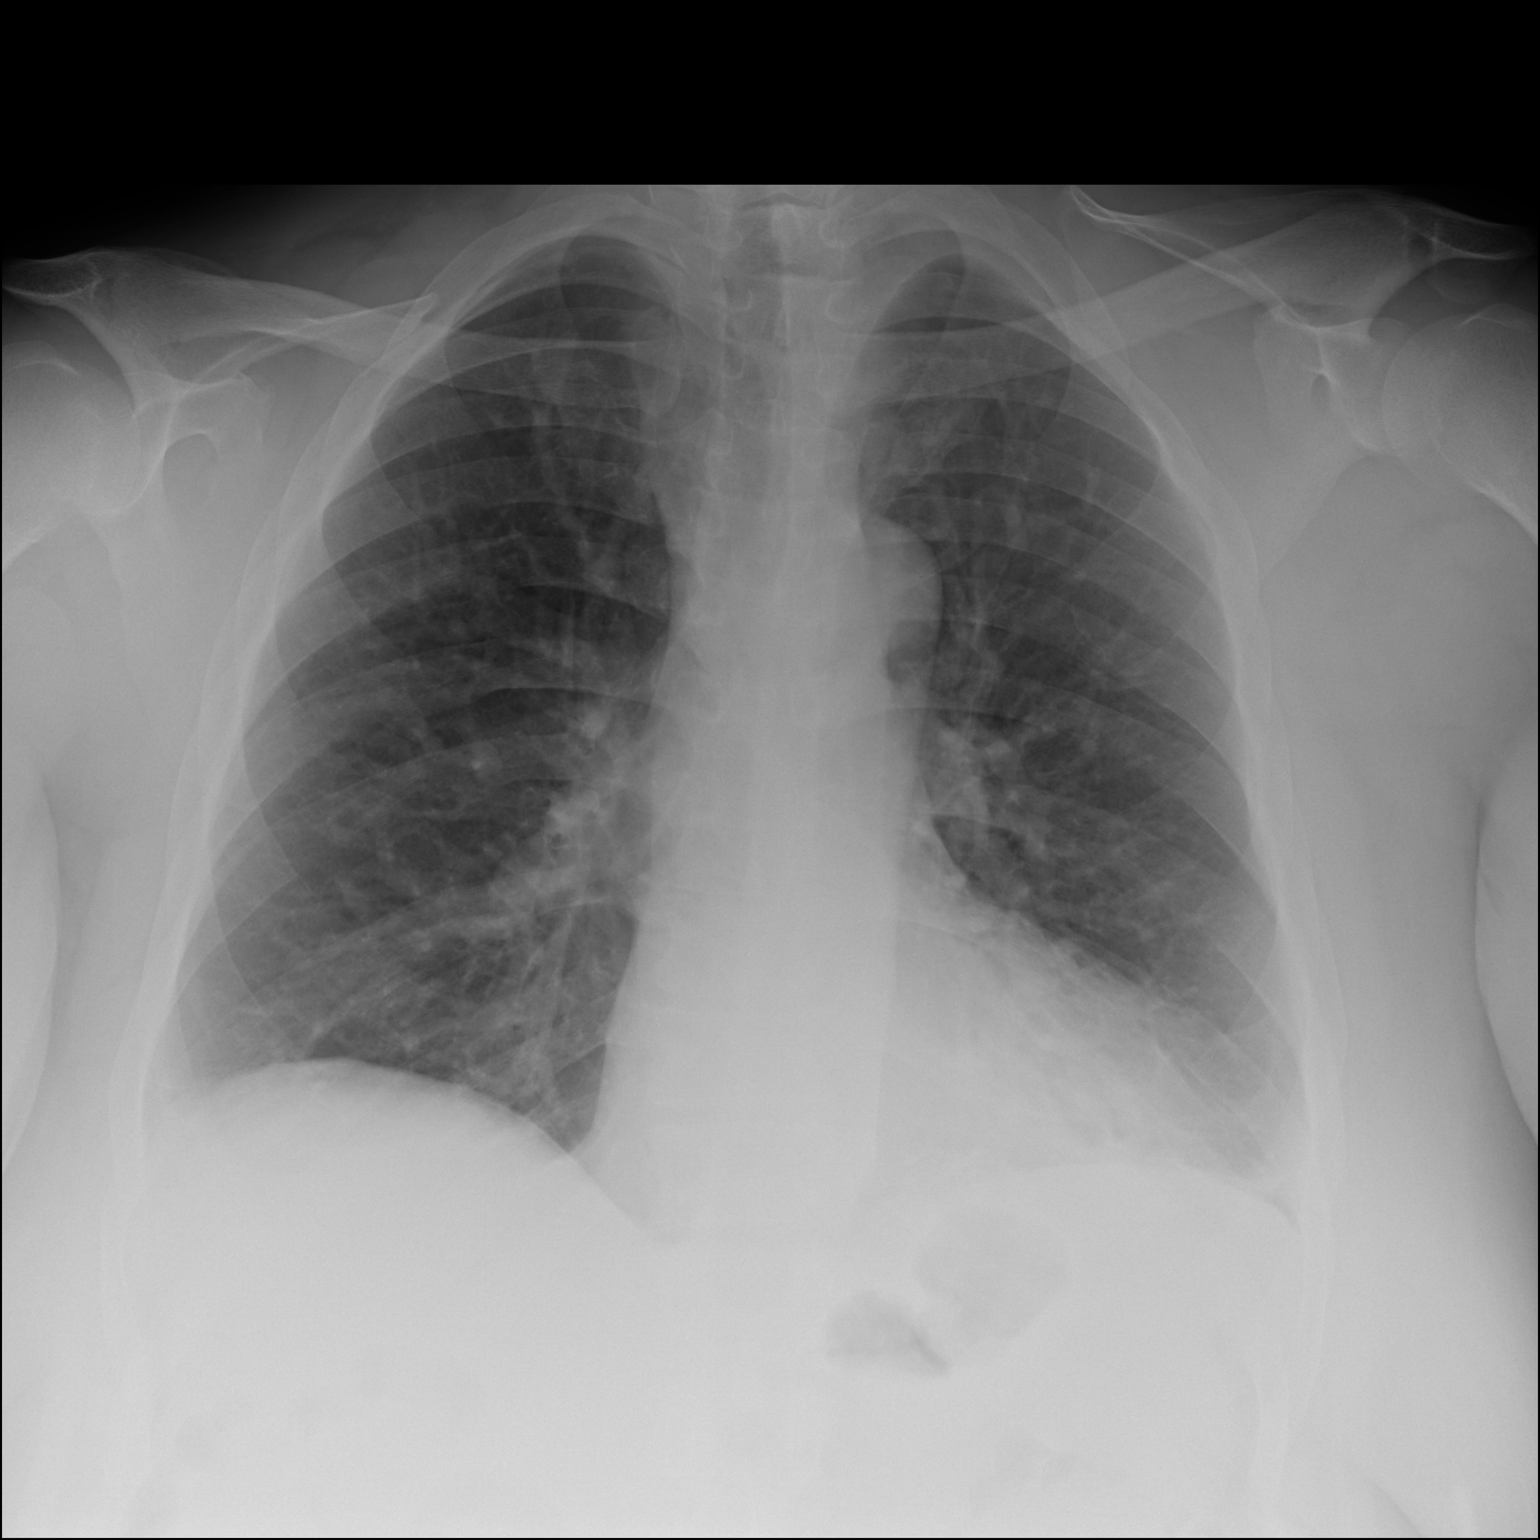

[dg chest 2 view (2 of 2)]
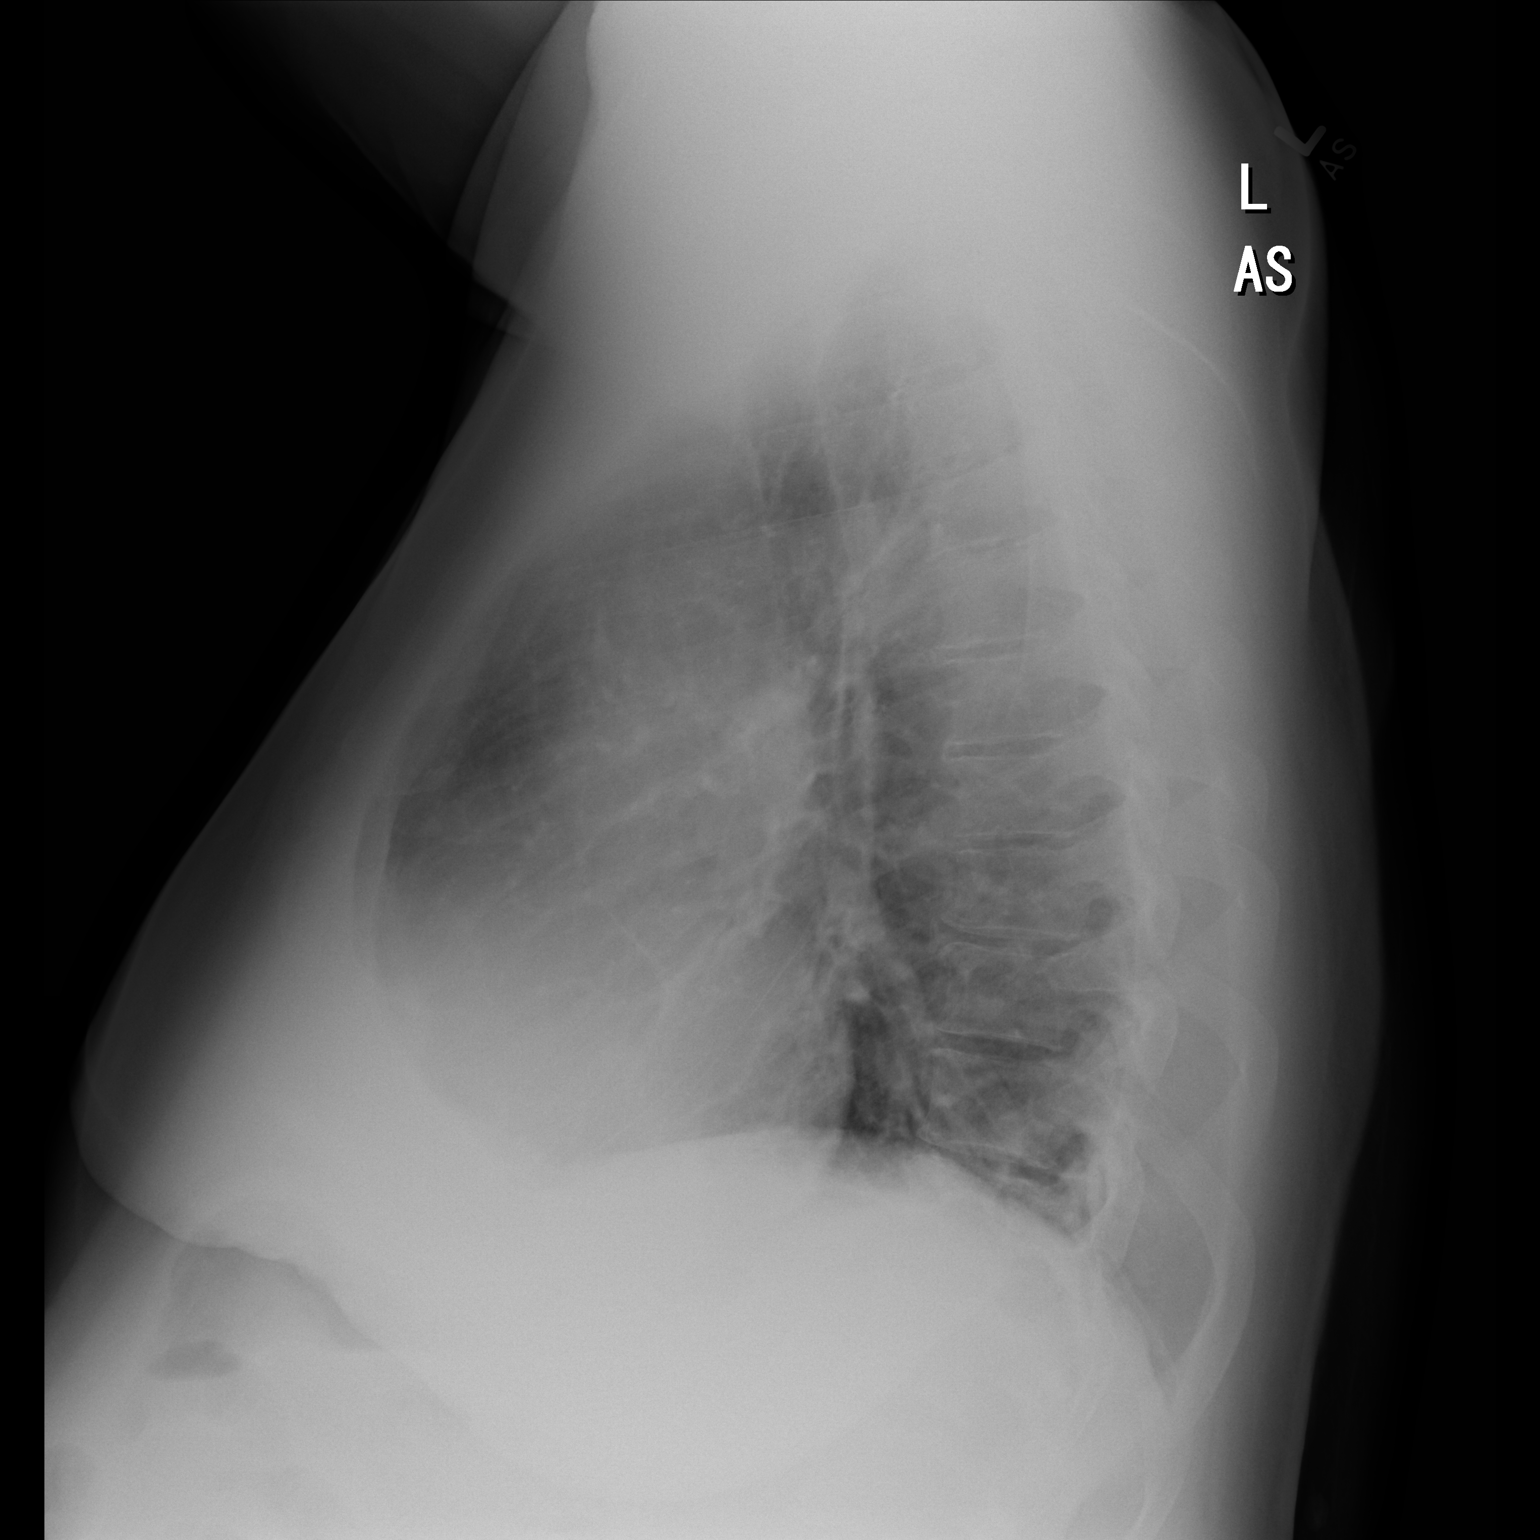

[2 of 2 positions shown; findings below may reference images not displayed]

FINDINGS: Stable cardiac and mediastinal contours. Left basilar atelectasis.
Pulmonary vascular redistribution. No pleural effusion or
pneumothorax. Thoracic spine degenerative changes.
IMPRESSION: Left basilar atelectasis.  Pulmonary vascular redistribution.

## 2022-07-26 NOTE — Telephone Encounter (Signed)
Yesi,  This pt is a documented difficult intubation and his procedure will need to be done at the hospital.   Regards,  Osvaldo Angst

## 2022-07-27 DIAGNOSIS — D631 Anemia in chronic kidney disease: Secondary | ICD-10-CM | POA: Diagnosis not present

## 2022-07-27 DIAGNOSIS — Z992 Dependence on renal dialysis: Secondary | ICD-10-CM | POA: Diagnosis not present

## 2022-07-27 DIAGNOSIS — N2581 Secondary hyperparathyroidism of renal origin: Secondary | ICD-10-CM | POA: Diagnosis not present

## 2022-07-27 DIAGNOSIS — N186 End stage renal disease: Secondary | ICD-10-CM | POA: Diagnosis not present

## 2022-07-28 DIAGNOSIS — N186 End stage renal disease: Secondary | ICD-10-CM | POA: Diagnosis not present

## 2022-07-28 DIAGNOSIS — D631 Anemia in chronic kidney disease: Secondary | ICD-10-CM | POA: Diagnosis not present

## 2022-07-28 DIAGNOSIS — Z992 Dependence on renal dialysis: Secondary | ICD-10-CM | POA: Diagnosis not present

## 2022-07-28 DIAGNOSIS — N2581 Secondary hyperparathyroidism of renal origin: Secondary | ICD-10-CM | POA: Diagnosis not present

## 2022-07-29 DIAGNOSIS — N186 End stage renal disease: Secondary | ICD-10-CM | POA: Diagnosis not present

## 2022-07-29 DIAGNOSIS — Z992 Dependence on renal dialysis: Secondary | ICD-10-CM | POA: Diagnosis not present

## 2022-07-29 DIAGNOSIS — N2581 Secondary hyperparathyroidism of renal origin: Secondary | ICD-10-CM | POA: Diagnosis not present

## 2022-07-29 DIAGNOSIS — D631 Anemia in chronic kidney disease: Secondary | ICD-10-CM | POA: Diagnosis not present

## 2022-07-30 DIAGNOSIS — N2581 Secondary hyperparathyroidism of renal origin: Secondary | ICD-10-CM | POA: Diagnosis not present

## 2022-07-30 DIAGNOSIS — Z992 Dependence on renal dialysis: Secondary | ICD-10-CM | POA: Diagnosis not present

## 2022-07-30 DIAGNOSIS — N186 End stage renal disease: Secondary | ICD-10-CM | POA: Diagnosis not present

## 2022-07-30 DIAGNOSIS — D631 Anemia in chronic kidney disease: Secondary | ICD-10-CM | POA: Diagnosis not present

## 2022-07-31 DIAGNOSIS — N2581 Secondary hyperparathyroidism of renal origin: Secondary | ICD-10-CM | POA: Diagnosis not present

## 2022-07-31 DIAGNOSIS — D631 Anemia in chronic kidney disease: Secondary | ICD-10-CM | POA: Diagnosis not present

## 2022-07-31 DIAGNOSIS — N186 End stage renal disease: Secondary | ICD-10-CM | POA: Diagnosis not present

## 2022-07-31 DIAGNOSIS — Z992 Dependence on renal dialysis: Secondary | ICD-10-CM | POA: Diagnosis not present

## 2022-08-01 DIAGNOSIS — D631 Anemia in chronic kidney disease: Secondary | ICD-10-CM | POA: Diagnosis not present

## 2022-08-01 DIAGNOSIS — Z992 Dependence on renal dialysis: Secondary | ICD-10-CM | POA: Diagnosis not present

## 2022-08-01 DIAGNOSIS — N186 End stage renal disease: Secondary | ICD-10-CM | POA: Diagnosis not present

## 2022-08-01 DIAGNOSIS — N2581 Secondary hyperparathyroidism of renal origin: Secondary | ICD-10-CM | POA: Diagnosis not present

## 2022-08-02 ENCOUNTER — Ambulatory Visit (AMBULATORY_SURGERY_CENTER): Payer: Medicare Other | Admitting: *Deleted

## 2022-08-02 VITALS — Ht 67.0 in | Wt 268.0 lb

## 2022-08-02 DIAGNOSIS — D631 Anemia in chronic kidney disease: Secondary | ICD-10-CM | POA: Diagnosis not present

## 2022-08-02 DIAGNOSIS — N186 End stage renal disease: Secondary | ICD-10-CM | POA: Diagnosis not present

## 2022-08-02 DIAGNOSIS — Z86010 Personal history of colonic polyps: Secondary | ICD-10-CM

## 2022-08-02 DIAGNOSIS — N2581 Secondary hyperparathyroidism of renal origin: Secondary | ICD-10-CM | POA: Diagnosis not present

## 2022-08-02 DIAGNOSIS — Z992 Dependence on renal dialysis: Secondary | ICD-10-CM | POA: Diagnosis not present

## 2022-08-02 MED ORDER — NA SULFATE-K SULFATE-MG SULF 17.5-3.13-1.6 GM/177ML PO SOLN: 1.0000 | Freq: Once | ORAL | 0 refills | Status: AC

## 2022-08-02 NOTE — Progress Notes (Signed)
Pt's previsit is done over the phone and all paperwork (prep instructions) sent to patient.  Pt's name and DOB verified at the beginning of the previsit.  Pt denies any difficulty with ambulating.   No egg or soy allergy known to patient  No issues known to pt with past sedation with any surgeries or procedures Patient has hx of difficult intubation No FH of Malignant Hyperthermia Pt is not on diet pills Pt is not on  home 02  Pt is not on blood thinners  Pt denies issues with constipation  Pt is not on dialysis Pt denies any upcoming cardiac testing Pt encouraged to use to use Singlecare or Goodrx to reduce cost

## 2022-08-03 DIAGNOSIS — Z992 Dependence on renal dialysis: Secondary | ICD-10-CM | POA: Diagnosis not present

## 2022-08-03 DIAGNOSIS — D631 Anemia in chronic kidney disease: Secondary | ICD-10-CM | POA: Diagnosis not present

## 2022-08-03 DIAGNOSIS — N186 End stage renal disease: Secondary | ICD-10-CM | POA: Diagnosis not present

## 2022-08-03 DIAGNOSIS — N2581 Secondary hyperparathyroidism of renal origin: Secondary | ICD-10-CM | POA: Diagnosis not present

## 2022-08-04 ENCOUNTER — Encounter (HOSPITAL_COMMUNITY): Payer: Self-pay | Admitting: Internal Medicine

## 2022-08-04 DIAGNOSIS — D631 Anemia in chronic kidney disease: Secondary | ICD-10-CM | POA: Diagnosis not present

## 2022-08-04 DIAGNOSIS — N2581 Secondary hyperparathyroidism of renal origin: Secondary | ICD-10-CM | POA: Diagnosis not present

## 2022-08-04 DIAGNOSIS — E211 Secondary hyperparathyroidism, not elsewhere classified: Secondary | ICD-10-CM | POA: Diagnosis not present

## 2022-08-04 DIAGNOSIS — N186 End stage renal disease: Secondary | ICD-10-CM | POA: Diagnosis not present

## 2022-08-04 DIAGNOSIS — E1122 Type 2 diabetes mellitus with diabetic chronic kidney disease: Secondary | ICD-10-CM | POA: Diagnosis not present

## 2022-08-04 DIAGNOSIS — Z992 Dependence on renal dialysis: Secondary | ICD-10-CM | POA: Diagnosis not present

## 2022-08-05 DIAGNOSIS — N186 End stage renal disease: Secondary | ICD-10-CM | POA: Diagnosis not present

## 2022-08-05 DIAGNOSIS — D631 Anemia in chronic kidney disease: Secondary | ICD-10-CM | POA: Diagnosis not present

## 2022-08-05 DIAGNOSIS — N2581 Secondary hyperparathyroidism of renal origin: Secondary | ICD-10-CM | POA: Diagnosis not present

## 2022-08-05 DIAGNOSIS — Z992 Dependence on renal dialysis: Secondary | ICD-10-CM | POA: Diagnosis not present

## 2022-08-06 DIAGNOSIS — D631 Anemia in chronic kidney disease: Secondary | ICD-10-CM | POA: Diagnosis not present

## 2022-08-06 DIAGNOSIS — Z992 Dependence on renal dialysis: Secondary | ICD-10-CM | POA: Diagnosis not present

## 2022-08-06 DIAGNOSIS — N186 End stage renal disease: Secondary | ICD-10-CM | POA: Diagnosis not present

## 2022-08-06 DIAGNOSIS — N2581 Secondary hyperparathyroidism of renal origin: Secondary | ICD-10-CM | POA: Diagnosis not present

## 2022-08-07 DIAGNOSIS — D631 Anemia in chronic kidney disease: Secondary | ICD-10-CM | POA: Diagnosis not present

## 2022-08-07 DIAGNOSIS — N186 End stage renal disease: Secondary | ICD-10-CM | POA: Diagnosis not present

## 2022-08-07 DIAGNOSIS — N2581 Secondary hyperparathyroidism of renal origin: Secondary | ICD-10-CM | POA: Diagnosis not present

## 2022-08-07 DIAGNOSIS — Z992 Dependence on renal dialysis: Secondary | ICD-10-CM | POA: Diagnosis not present

## 2022-08-08 DIAGNOSIS — D631 Anemia in chronic kidney disease: Secondary | ICD-10-CM | POA: Diagnosis not present

## 2022-08-08 DIAGNOSIS — Z992 Dependence on renal dialysis: Secondary | ICD-10-CM | POA: Diagnosis not present

## 2022-08-08 DIAGNOSIS — Z6841 Body Mass Index (BMI) 40.0 and over, adult: Secondary | ICD-10-CM | POA: Diagnosis not present

## 2022-08-08 DIAGNOSIS — N186 End stage renal disease: Secondary | ICD-10-CM | POA: Diagnosis not present

## 2022-08-08 DIAGNOSIS — N2581 Secondary hyperparathyroidism of renal origin: Secondary | ICD-10-CM | POA: Diagnosis not present

## 2022-08-09 DIAGNOSIS — Z992 Dependence on renal dialysis: Secondary | ICD-10-CM | POA: Diagnosis not present

## 2022-08-09 DIAGNOSIS — N186 End stage renal disease: Secondary | ICD-10-CM | POA: Diagnosis not present

## 2022-08-09 DIAGNOSIS — N2581 Secondary hyperparathyroidism of renal origin: Secondary | ICD-10-CM | POA: Diagnosis not present

## 2022-08-09 DIAGNOSIS — D631 Anemia in chronic kidney disease: Secondary | ICD-10-CM | POA: Diagnosis not present

## 2022-08-10 DIAGNOSIS — N186 End stage renal disease: Secondary | ICD-10-CM | POA: Diagnosis not present

## 2022-08-10 DIAGNOSIS — D631 Anemia in chronic kidney disease: Secondary | ICD-10-CM | POA: Diagnosis not present

## 2022-08-10 DIAGNOSIS — N2581 Secondary hyperparathyroidism of renal origin: Secondary | ICD-10-CM | POA: Diagnosis not present

## 2022-08-10 DIAGNOSIS — Z992 Dependence on renal dialysis: Secondary | ICD-10-CM | POA: Diagnosis not present

## 2022-08-10 NOTE — Anesthesia Preprocedure Evaluation (Signed)
Anesthesia Evaluation  Patient identified by MRN, date of birth, ID band Patient awake    Reviewed: Allergy & Precautions, NPO status , Patient's Chart, lab work & pertinent test results  Airway Mallampati: II  TM Distance: >3 FB Neck ROM: Full    Dental no notable dental hx.    Pulmonary asthma , sleep apnea , former smoker   Pulmonary exam normal        Cardiovascular hypertension, Pt. on medications +CHF   Rhythm:Regular Rate:Normal     Neuro/Psych negative neurological ROS  negative psych ROS   GI/Hepatic Neg liver ROS,,,Colon polyps   Endo/Other  diabetes, Type 2, Oral Hypoglycemic Agents  Morbid obesity  Renal/GU CRFRenal disease     Musculoskeletal negative musculoskeletal ROS (+)    Abdominal Normal abdominal exam  (+)   Peds  Hematology  (+) Blood dyscrasia, anemia   Anesthesia Other Findings   Reproductive/Obstetrics                             Anesthesia Physical Anesthesia Plan  ASA: 3  Anesthesia Plan: MAC   Post-op Pain Management:    Induction: Intravenous  PONV Risk Score and Plan: 1 and Propofol infusion and Treatment may vary due to age or medical condition  Airway Management Planned: Simple Face Mask and Nasal Cannula  Additional Equipment: None  Intra-op Plan:   Post-operative Plan:   Informed Consent: I have reviewed the patients History and Physical, chart, labs and discussed the procedure including the risks, benefits and alternatives for the proposed anesthesia with the patient or authorized representative who has indicated his/her understanding and acceptance.     Dental advisory given  Plan Discussed with: CRNA  Anesthesia Plan Comments:        Anesthesia Quick Evaluation

## 2022-08-11 ENCOUNTER — Ambulatory Visit (HOSPITAL_COMMUNITY)
Admission: RE | Admit: 2022-08-11 | Discharge: 2022-08-11 | Disposition: A | Discharge status: Home / Self Care | Payer: Medicare Other | Source: Ambulatory Visit | Attending: Internal Medicine | Admitting: Internal Medicine

## 2022-08-11 ENCOUNTER — Other Ambulatory Visit: Payer: Self-pay

## 2022-08-11 ENCOUNTER — Ambulatory Visit (HOSPITAL_COMMUNITY): Payer: Medicare Other | Admitting: Certified Registered Nurse Anesthetist

## 2022-08-11 ENCOUNTER — Ambulatory Visit (HOSPITAL_BASED_OUTPATIENT_CLINIC_OR_DEPARTMENT_OTHER): Payer: Medicare Other | Admitting: Certified Registered Nurse Anesthetist

## 2022-08-11 ENCOUNTER — Encounter (HOSPITAL_COMMUNITY)
Admission: RE | Disposition: A | Discharge status: Home / Self Care | Payer: Self-pay | Source: Ambulatory Visit | Attending: Internal Medicine

## 2022-08-11 ENCOUNTER — Encounter (HOSPITAL_COMMUNITY): Payer: Self-pay | Admitting: Internal Medicine

## 2022-08-11 DIAGNOSIS — Z7984 Long term (current) use of oral hypoglycemic drugs: Secondary | ICD-10-CM | POA: Diagnosis not present

## 2022-08-11 DIAGNOSIS — I509 Heart failure, unspecified: Secondary | ICD-10-CM | POA: Diagnosis not present

## 2022-08-11 DIAGNOSIS — Z87891 Personal history of nicotine dependence: Secondary | ICD-10-CM | POA: Insufficient documentation

## 2022-08-11 DIAGNOSIS — K648 Other hemorrhoids: Secondary | ICD-10-CM

## 2022-08-11 DIAGNOSIS — N186 End stage renal disease: Secondary | ICD-10-CM | POA: Insufficient documentation

## 2022-08-11 DIAGNOSIS — Z7985 Long-term (current) use of injectable non-insulin antidiabetic drugs: Secondary | ICD-10-CM

## 2022-08-11 DIAGNOSIS — Z1211 Encounter for screening for malignant neoplasm of colon: Secondary | ICD-10-CM | POA: Diagnosis not present

## 2022-08-11 DIAGNOSIS — E785 Hyperlipidemia, unspecified: Secondary | ICD-10-CM | POA: Insufficient documentation

## 2022-08-11 DIAGNOSIS — K5730 Diverticulosis of large intestine without perforation or abscess without bleeding: Secondary | ICD-10-CM

## 2022-08-11 DIAGNOSIS — G4730 Sleep apnea, unspecified: Secondary | ICD-10-CM | POA: Insufficient documentation

## 2022-08-11 DIAGNOSIS — D759 Disease of blood and blood-forming organs, unspecified: Secondary | ICD-10-CM | POA: Diagnosis not present

## 2022-08-11 DIAGNOSIS — J45909 Unspecified asthma, uncomplicated: Secondary | ICD-10-CM | POA: Diagnosis not present

## 2022-08-11 DIAGNOSIS — D649 Anemia, unspecified: Secondary | ICD-10-CM | POA: Diagnosis not present

## 2022-08-11 DIAGNOSIS — I132 Hypertensive heart and chronic kidney disease with heart failure and with stage 5 chronic kidney disease, or end stage renal disease: Secondary | ICD-10-CM | POA: Insufficient documentation

## 2022-08-11 DIAGNOSIS — Z992 Dependence on renal dialysis: Secondary | ICD-10-CM | POA: Insufficient documentation

## 2022-08-11 DIAGNOSIS — Z860100 Personal history of colon polyps, unspecified: Secondary | ICD-10-CM

## 2022-08-11 DIAGNOSIS — Z6841 Body Mass Index (BMI) 40.0 and over, adult: Secondary | ICD-10-CM | POA: Insufficient documentation

## 2022-08-11 DIAGNOSIS — I130 Hypertensive heart and chronic kidney disease with heart failure and stage 1 through stage 4 chronic kidney disease, or unspecified chronic kidney disease: Secondary | ICD-10-CM

## 2022-08-11 DIAGNOSIS — E1122 Type 2 diabetes mellitus with diabetic chronic kidney disease: Secondary | ICD-10-CM | POA: Insufficient documentation

## 2022-08-11 DIAGNOSIS — N189 Chronic kidney disease, unspecified: Secondary | ICD-10-CM

## 2022-08-11 DIAGNOSIS — D631 Anemia in chronic kidney disease: Secondary | ICD-10-CM

## 2022-08-11 DIAGNOSIS — K5790 Diverticulosis of intestine, part unspecified, without perforation or abscess without bleeding: Secondary | ICD-10-CM | POA: Diagnosis not present

## 2022-08-11 DIAGNOSIS — N2581 Secondary hyperparathyroidism of renal origin: Secondary | ICD-10-CM | POA: Diagnosis not present

## 2022-08-11 HISTORY — PX: COLONOSCOPY WITH PROPOFOL: SHX5780

## 2022-08-11 LAB — POCT I-STAT, CHEM 8
BUN: 67 mg/dL — ABNORMAL HIGH (ref 6–20)
Calcium, Ion: 0.95 mmol/L — ABNORMAL LOW (ref 1.15–1.40)
Chloride: 96 mmol/L — ABNORMAL LOW (ref 98–111)
Creatinine, Ser: >18 mg/dL — ABNORMAL HIGH (ref 0.61–1.24)
Glucose, Bld: 106 mg/dL — ABNORMAL HIGH (ref 70–99)
HCT: 26 % — ABNORMAL LOW (ref 39.0–52.0)
Hemoglobin: 8.8 g/dL — ABNORMAL LOW (ref 13.0–17.0)
Potassium: 3.4 mmol/L — ABNORMAL LOW (ref 3.5–5.1)
Sodium: 138 mmol/L (ref 135–145)
TCO2: 26 mmol/L (ref 22–32)

## 2022-08-11 SURGERY — COLONOSCOPY WITH PROPOFOL
Anesthesia: Monitor Anesthesia Care

## 2022-08-11 MED ORDER — PROPOFOL 1000 MG/100ML IV EMUL
INTRAVENOUS | Status: AC
  Filled 2022-08-11: qty 100

## 2022-08-11 MED ORDER — LIDOCAINE 2% (20 MG/ML) 5 ML SYRINGE
INTRAMUSCULAR | Status: DC | PRN
  Administered 2022-08-11: 100 mg via INTRAVENOUS

## 2022-08-11 MED ORDER — SODIUM CHLORIDE 0.9 % IV SOLN
INTRAVENOUS | Status: DC
  Administered 2022-08-11: 500 mL via INTRAVENOUS

## 2022-08-11 MED ORDER — PROPOFOL 10 MG/ML IV BOLUS
INTRAVENOUS | Status: DC | PRN
  Administered 2022-08-11 (×2): 10 mg via INTRAVENOUS

## 2022-08-11 MED ORDER — PROPOFOL 500 MG/50ML IV EMUL
INTRAVENOUS | Status: DC | PRN
  Administered 2022-08-11: 90 ug/kg/min via INTRAVENOUS

## 2022-08-11 MED ORDER — PROPOFOL 500 MG/50ML IV EMUL
INTRAVENOUS | Status: AC
  Filled 2022-08-11: qty 50

## 2022-08-11 SURGICAL SUPPLY — 22 items

## 2022-08-11 NOTE — Discharge Instructions (Signed)

## 2022-08-11 NOTE — Anesthesia Postprocedure Evaluation (Signed)
Anesthesia Post Note  Patient: Adrian Frye  Procedure(s) Performed: COLONOSCOPY WITH PROPOFOL     Patient location during evaluation: Endoscopy Anesthesia Type: MAC Level of consciousness: awake and alert Pain management: pain level controlled Vital Signs Assessment: post-procedure vital signs reviewed and stable Respiratory status: spontaneous breathing, nonlabored ventilation, respiratory function stable and patient connected to nasal cannula oxygen Cardiovascular status: stable and blood pressure returned to baseline Postop Assessment: no apparent nausea or vomiting Anesthetic complications: no   No notable events documented.  Last Vitals:  Vitals:   08/11/22 0910 08/11/22 0920  BP: 104/64 112/69  Pulse: 83 84  Resp: (!) 28 (!) 23  Temp:    SpO2: 93% 92%    Last Pain:  Vitals:   08/11/22 0920  TempSrc:   PainSc: 0-No pain                 March Rummage Emy Angevine

## 2022-08-11 NOTE — Op Note (Signed)
[facility] Patient Name: Adrian Frye Procedure Date: 08/11/2022 MRN: [id number] Attending MD: Docia Chuck. [name] , MD, [id number] Date of Birth: 04/06/1971 CSN: [id number] Age: 52 Admit Type: Outpatient Procedure:                Colonoscopy Indications:              High risk colon cancer surveillance: Personal                            history of adenoma (10 mm or greater in size) Providers:                Docia Chuck. [name], MD, Carlyn Reichert, RN, Fransico Setters                            [place], Technician Referring MD:              Medicines:                Monitored Anesthesia Care Complications:            No immediate complications. Estimated blood loss:                            None. Estimated Blood Loss:     Estimated blood loss: none. Procedure:                Pre-Anesthesia Assessment:                           - Prior to the procedure, a History and Physical                            was performed, and patient medications and                            allergies were reviewed. The patient's tolerance of                            previous anesthesia was also reviewed. The risks                            and benefits of the procedure and the sedation                            options and risks were discussed with the patient.                            All questions were answered, and informed consent                            was obtained. Prior Anticoagulants: The patient has                            taken no anticoagulant or antiplatelet agents. ASA  Grade Assessment: III - A patient with severe                            systemic disease. After reviewing the risks and                            benefits, the patient was deemed in satisfactory                            condition to undergo the procedure.                           After obtaining informed consent, the colonoscope                            was passed under direct  vision. Throughout the                            procedure, the patient's blood pressure, pulse, and                            oxygen saturations were monitored continuously. The                            [id number]) Olympus colonoscope was                            introduced through the anus and advanced to the the                            cecum, identified by appendiceal orifice and                            ileocecal valve. The ileocecal valve, appendiceal                            orifice, and rectum were photographed. The quality                            of the bowel preparation was excellent. The                            colonoscopy was performed without difficulty. The                            patient tolerated the procedure well. The bowel                            preparation used was SUPREP via split dose                            instruction. Scope In: 8:45:18 AM Scope Out: 9:00:26 AM Scope Withdrawal Time: 0 hours 11 minutes 9 seconds  Total Procedure Duration: 0 hours 15 minutes 8 seconds  Findings:      Multiple diverticula were found in the entire colon.      Internal hemorrhoids were found during retroflexion. The hemorrhoids       were moderate.      The exam was otherwise without abnormality on direct and retroflexion       views. Prior marking tattoo in the left colon noted. Impression:               - Diverticulosis in the entire examined colon.                           - Internal hemorrhoids.                           - The examination was otherwise normal on direct                            and retroflexion views.                           - Prior marking tattoo in the left colon noted.. Moderate Sedation:      none Recommendation:           - Repeat colonoscopy in 5 years for surveillance.                           - Patient has a contact number available for                            emergencies. The signs and symptoms of potential                             delayed complications were discussed with the                            patient. Return to normal activities tomorrow.                            Written discharge instructions were provided to the                            patient.                           - Resume previous diet.                           - Continue present medications. Procedure Code(s):        --- Professional ---                           [phone], Colonoscopy, flexible; diagnostic, including                            collection of specimen(s) by brushing or washing,  when performed (separate procedure) Diagnosis Code(s):        --- Professional ---                           Z86.010, Personal history of colonic polyps                           K64.8, Other hemorrhoids                           K57.30, Diverticulosis of large intestine without                            perforation or abscess without bleeding CPT copyright 2022 American Medical Association. All rights reserved. The codes documented in this report are preliminary and upon coder review may  be revised to meet current compliance requirements. Docia Chuck. [name], MD 08/11/2022 9:10:44 AM This report has been signed electronically. Number of Addenda: 0

## 2022-08-11 NOTE — Anesthesia Procedure Notes (Signed)
Procedure Name: MAC Date/Time: 08/11/2022 8:35 AM  Performed by: West Pugh, CRNAPre-anesthesia Checklist: Patient identified, Emergency Drugs available, Suction available, Patient being monitored and Timeout performed Patient Re-evaluated:Patient Re-evaluated prior to induction Oxygen Delivery Method: Simple face mask Preoxygenation: Pre-oxygenation with 100% oxygen Placement Confirmation: positive ETCO2 Dental Injury: Teeth and Oropharynx as per pre-operative assessment

## 2022-08-11 NOTE — H&P (Signed)
HISTORY OF PRESENT ILLNESS:   Adrian Frye is a 52 y.o. male, Adrian Frye and Adrian Frye, with morbid obesity, hypertension, hyperlipidemia, and end-stage renal disease on peritoneal dialysis who presents today regarding surveillance colonoscopy.  The patient was initially evaluated in this office February 07, 2019 regarding anemia and Hemoccult positive stool.  See that dictation for details.  He subsequently underwent colonoscopy and upper endoscopy March 12, 2019.  Upper endoscopy was normal.  Colonoscopy revealed 2 tubular adenomas including a 35 mm polyp in the descending colon.  Follow-up in 3 years recommended.  Patient received a recall letter and presents to the office for evaluation regarding surveillance colonoscopy, particularly in light of his comorbidities.   Patient tells me that he has been doing well except for increased intestinal gas as manifested by both belching and flatus.  No abdominal pain or other issues.  He feels that this is excessive.  Since his last colonoscopy he has transition from hemodialysis to peritoneal dialysis.  Current BMI is 46.  No blood thinners.   REVIEW OF SYSTEMS:   All non-GI ROS negative unless otherwise stated in the HPI.       Past Medical History:  Diagnosis Date   Allergic rhinitis     Allergy     Anemia     Asthma      as a child   Bacteremia     Benign essential hypertension     Dilated idiopathic cardiomyopathy ([place])      now resoved with EF 55% by echo 2011   Edema     Encounter for blood transfusion     Endocarditis     ESRD (end stage renal disease) ([place])     ESRD on peritoneal dialysis ([place])     Heart disease     Hyperlipidemia     Hyperparathyroidism, secondary ([place])     Hypertension     Morbid obesity ([place])     Sleep apnea      c-pap   UGI bleed 2011    ASA           Past Surgical History:  Procedure Laterality Date   ACHILLES TENDON REPAIR        ruptured; right   AV  FISTULA PLACEMENT Left 11/03/2017    Procedure: ARTERIOVENOUS (AV) FISTULA CREATION  left upper arm;  Surgeon: Adrian Posner, MD;  Location: [facility];  Service: Vascular;  Laterality: Left;   DIALYSIS FISTULA CREATION       INSERTION OF DIALYSIS CATHETER N/A 11/03/2017    Procedure: INSERTION OF DIALYSIS CATHETER - RIGHT INTERNAL JUGULAR PLACEMENT;  Surgeon: Adrian Posner, MD;  Location[facility];  Service: Vascular;  Laterality: N/A;   IR FLUORO GUIDE CV LINE LEFT   01/24/2020   IR REMOVAL TUN CV CATH W/O FL   01/21/2020   IR REMOVAL TUN CV CATH W/O FL   03/06/2020   IR US GUIDE VASC ACCESS LEFT   01/24/2020   KIDNEY FAILURE       LAPAROSCOPIC GASTROTOMY W/ REPAIR OF ULCER       PLEURAL SCARIFICATION        left, football trauma-chest tube   STOMACH SURGERY       TEE WITHOUT CARDIOVERSION N/A 01/22/2020    Procedure: TRANSESOPHAGEAL ECHOCARDIOGRAM (TEE);  Surgeon: Adrian Casino, MD;  Location: [place];  Service: Cardiovascular;  Laterality: N/A;   TONSILLECTOMY       TONSILLECTOMY  UPPER GASTROINTESTINAL ENDOSCOPY          Social History Adrian Frye  reports that he quit smoking about 12 years ago. His smoking use included cigarettes. He has never used smokeless tobacco. He reports that he does not currently use alcohol after a past usage of about 2.0 standard drinks of alcohol per week. He reports that he does not use drugs.   family history includes Diabetes in his mother; Heart disease in his father; Heart failure in his mother; Hypertension in his brother, brother, and mother.        Allergies  Allergen Reactions   Lisinopril Swelling and Anaphylaxis      Angioedema   Other reaction(s): Angioedema (ALLERGY/intolerance)   Losartan Swelling and Anaphylaxis          PHYSICAL EXAMINATION: Vital signs: Pulse 95   Ht 5\' 7"  (1.702 m)   Wt 293 lb 9.6 oz (133.2 kg)   SpO2 98%   BMI 45.98 kg/m   Constitutional: Morbidly obese, no acute distress Psychiatric: Pleasant,  alert and oriented x 3, cooperative Eyes: extraocular movements intact, anicteric, conjunctiva pink Mouth: oral pharynx moist, no lesions Neck: supple no lymphadenopathy Cardiovascular: heart regular rate and rhythm, no murmur Lungs: clear to auscultation bilaterally Abdomen: soft, nontender, nondistended, no obvious ascites, no peritoneal signs, normal bowel sounds, no organomegaly Rectal: Deferred to colonoscopy Extremities: no clubbing, cyanosis, or lower extremity edema bilaterally Skin: no lesions on visible extremities Neuro: No focal deficits.  Cranial nerves intact   ASSESSMENT:   1.  History of multiple advanced adenomatous colon polyps October 2020.  Due for surveillance. 2.  Multiple comorbidities including morbid obesity with BMI of 46 and end-stage renal disease on peritoneal dialysis.     PLAN:   1.  Surveillance colonoscopy.  The patient is HIGH RISK given his comorbidities and body habitus.  As such, his procedure will need to be performed at the hospital with monitored anesthesia care.The nature of the procedure, as well as the risks, benefits, and alternatives were carefully and thoroughly reviewed with the patient. Ample time for discussion and questions allowed. The patient understood, was satisfied, and agreed to proceed.   No interval Adrian change or change in physical examination from recent complete H&P as outlined above.  After colonoscopy

## 2022-08-11 NOTE — Transfer of Care (Signed)
Immediate Anesthesia Transfer of Care Note  Patient: Adrian Frye  Procedure(s) Performed: COLONOSCOPY WITH PROPOFOL  Patient Location: PACU and Endoscopy Unit  Anesthesia Type:MAC  Level of Consciousness: awake and patient cooperative  Airway & Oxygen Therapy: Patient Spontanous Breathing and Patient connected to face mask oxygen  Post-op Assessment: Report given to RN and Post -op Vital signs reviewed and stable  Post vital signs: Reviewed and stable  Last Vitals:  Vitals Value Taken Time  BP 101/65 08/11/22 0905  Temp 36.5 C 08/11/22 0905  Pulse 82 08/11/22 0905  Resp 13 08/11/22 0905  SpO2 100 % 08/11/22 0905    Last Pain:  Vitals:   08/11/22 0905  TempSrc: Temporal  PainSc: 0-No pain         Complications: No notable events documented.

## 2022-08-12 DIAGNOSIS — N186 End stage renal disease: Secondary | ICD-10-CM | POA: Diagnosis not present

## 2022-08-12 DIAGNOSIS — N2581 Secondary hyperparathyroidism of renal origin: Secondary | ICD-10-CM | POA: Diagnosis not present

## 2022-08-12 DIAGNOSIS — Z992 Dependence on renal dialysis: Secondary | ICD-10-CM | POA: Diagnosis not present

## 2022-08-12 DIAGNOSIS — D631 Anemia in chronic kidney disease: Secondary | ICD-10-CM | POA: Diagnosis not present

## 2022-08-13 DIAGNOSIS — D631 Anemia in chronic kidney disease: Secondary | ICD-10-CM | POA: Diagnosis not present

## 2022-08-13 DIAGNOSIS — N2581 Secondary hyperparathyroidism of renal origin: Secondary | ICD-10-CM | POA: Diagnosis not present

## 2022-08-13 DIAGNOSIS — N186 End stage renal disease: Secondary | ICD-10-CM | POA: Diagnosis not present

## 2022-08-13 DIAGNOSIS — Z992 Dependence on renal dialysis: Secondary | ICD-10-CM | POA: Diagnosis not present

## 2022-08-14 DIAGNOSIS — E1122 Type 2 diabetes mellitus with diabetic chronic kidney disease: Secondary | ICD-10-CM | POA: Diagnosis not present

## 2022-08-14 DIAGNOSIS — N2581 Secondary hyperparathyroidism of renal origin: Secondary | ICD-10-CM | POA: Diagnosis not present

## 2022-08-14 DIAGNOSIS — Z992 Dependence on renal dialysis: Secondary | ICD-10-CM | POA: Diagnosis not present

## 2022-08-14 DIAGNOSIS — D631 Anemia in chronic kidney disease: Secondary | ICD-10-CM | POA: Diagnosis not present

## 2022-08-14 DIAGNOSIS — N186 End stage renal disease: Secondary | ICD-10-CM | POA: Diagnosis not present

## 2022-08-15 ENCOUNTER — Encounter (HOSPITAL_COMMUNITY): Payer: Self-pay | Admitting: Internal Medicine

## 2022-08-15 DIAGNOSIS — Z992 Dependence on renal dialysis: Secondary | ICD-10-CM | POA: Diagnosis not present

## 2022-08-15 DIAGNOSIS — D631 Anemia in chronic kidney disease: Secondary | ICD-10-CM | POA: Diagnosis not present

## 2022-08-15 DIAGNOSIS — N2581 Secondary hyperparathyroidism of renal origin: Secondary | ICD-10-CM | POA: Diagnosis not present

## 2022-08-15 DIAGNOSIS — N186 End stage renal disease: Secondary | ICD-10-CM | POA: Diagnosis not present

## 2022-08-15 DIAGNOSIS — Z4932 Encounter for adequacy testing for peritoneal dialysis: Secondary | ICD-10-CM | POA: Diagnosis not present

## 2022-08-16 DIAGNOSIS — Z4932 Encounter for adequacy testing for peritoneal dialysis: Secondary | ICD-10-CM | POA: Diagnosis not present

## 2022-08-16 DIAGNOSIS — N186 End stage renal disease: Secondary | ICD-10-CM | POA: Diagnosis not present

## 2022-08-16 DIAGNOSIS — Z992 Dependence on renal dialysis: Secondary | ICD-10-CM | POA: Diagnosis not present

## 2022-08-16 DIAGNOSIS — N2581 Secondary hyperparathyroidism of renal origin: Secondary | ICD-10-CM | POA: Diagnosis not present

## 2022-08-16 DIAGNOSIS — D631 Anemia in chronic kidney disease: Secondary | ICD-10-CM | POA: Diagnosis not present

## 2022-08-17 DIAGNOSIS — D631 Anemia in chronic kidney disease: Secondary | ICD-10-CM | POA: Diagnosis not present

## 2022-08-17 DIAGNOSIS — N186 End stage renal disease: Secondary | ICD-10-CM | POA: Diagnosis not present

## 2022-08-17 DIAGNOSIS — N2581 Secondary hyperparathyroidism of renal origin: Secondary | ICD-10-CM | POA: Diagnosis not present

## 2022-08-17 DIAGNOSIS — Z992 Dependence on renal dialysis: Secondary | ICD-10-CM | POA: Diagnosis not present

## 2022-08-17 DIAGNOSIS — Z4932 Encounter for adequacy testing for peritoneal dialysis: Secondary | ICD-10-CM | POA: Diagnosis not present

## 2022-08-18 DIAGNOSIS — N186 End stage renal disease: Secondary | ICD-10-CM | POA: Diagnosis not present

## 2022-08-18 DIAGNOSIS — N2581 Secondary hyperparathyroidism of renal origin: Secondary | ICD-10-CM | POA: Diagnosis not present

## 2022-08-18 DIAGNOSIS — D631 Anemia in chronic kidney disease: Secondary | ICD-10-CM | POA: Diagnosis not present

## 2022-08-18 DIAGNOSIS — Z992 Dependence on renal dialysis: Secondary | ICD-10-CM | POA: Diagnosis not present

## 2022-08-18 DIAGNOSIS — Z4932 Encounter for adequacy testing for peritoneal dialysis: Secondary | ICD-10-CM | POA: Diagnosis not present

## 2022-08-19 DIAGNOSIS — Z992 Dependence on renal dialysis: Secondary | ICD-10-CM | POA: Diagnosis not present

## 2022-08-19 DIAGNOSIS — N2581 Secondary hyperparathyroidism of renal origin: Secondary | ICD-10-CM | POA: Diagnosis not present

## 2022-08-19 DIAGNOSIS — D631 Anemia in chronic kidney disease: Secondary | ICD-10-CM | POA: Diagnosis not present

## 2022-08-19 DIAGNOSIS — N186 End stage renal disease: Secondary | ICD-10-CM | POA: Diagnosis not present

## 2022-08-19 DIAGNOSIS — Z4932 Encounter for adequacy testing for peritoneal dialysis: Secondary | ICD-10-CM | POA: Diagnosis not present

## 2022-08-20 DIAGNOSIS — N2581 Secondary hyperparathyroidism of renal origin: Secondary | ICD-10-CM | POA: Diagnosis not present

## 2022-08-20 DIAGNOSIS — N186 End stage renal disease: Secondary | ICD-10-CM | POA: Diagnosis not present

## 2022-08-20 DIAGNOSIS — Z992 Dependence on renal dialysis: Secondary | ICD-10-CM | POA: Diagnosis not present

## 2022-08-20 DIAGNOSIS — Z4932 Encounter for adequacy testing for peritoneal dialysis: Secondary | ICD-10-CM | POA: Diagnosis not present

## 2022-08-20 DIAGNOSIS — D631 Anemia in chronic kidney disease: Secondary | ICD-10-CM | POA: Diagnosis not present

## 2022-08-21 DIAGNOSIS — N2581 Secondary hyperparathyroidism of renal origin: Secondary | ICD-10-CM | POA: Diagnosis not present

## 2022-08-21 DIAGNOSIS — N186 End stage renal disease: Secondary | ICD-10-CM | POA: Diagnosis not present

## 2022-08-21 DIAGNOSIS — D631 Anemia in chronic kidney disease: Secondary | ICD-10-CM | POA: Diagnosis not present

## 2022-08-21 DIAGNOSIS — Z4932 Encounter for adequacy testing for peritoneal dialysis: Secondary | ICD-10-CM | POA: Diagnosis not present

## 2022-08-21 DIAGNOSIS — Z992 Dependence on renal dialysis: Secondary | ICD-10-CM | POA: Diagnosis not present

## 2022-08-22 DIAGNOSIS — N2581 Secondary hyperparathyroidism of renal origin: Secondary | ICD-10-CM | POA: Diagnosis not present

## 2022-08-22 DIAGNOSIS — N186 End stage renal disease: Secondary | ICD-10-CM | POA: Diagnosis not present

## 2022-08-22 DIAGNOSIS — Z4932 Encounter for adequacy testing for peritoneal dialysis: Secondary | ICD-10-CM | POA: Diagnosis not present

## 2022-08-22 DIAGNOSIS — Z992 Dependence on renal dialysis: Secondary | ICD-10-CM | POA: Diagnosis not present

## 2022-08-22 DIAGNOSIS — D631 Anemia in chronic kidney disease: Secondary | ICD-10-CM | POA: Diagnosis not present

## 2022-08-23 DIAGNOSIS — N2581 Secondary hyperparathyroidism of renal origin: Secondary | ICD-10-CM | POA: Diagnosis not present

## 2022-08-23 DIAGNOSIS — Z992 Dependence on renal dialysis: Secondary | ICD-10-CM | POA: Diagnosis not present

## 2022-08-23 DIAGNOSIS — Z4932 Encounter for adequacy testing for peritoneal dialysis: Secondary | ICD-10-CM | POA: Diagnosis not present

## 2022-08-23 DIAGNOSIS — D631 Anemia in chronic kidney disease: Secondary | ICD-10-CM | POA: Diagnosis not present

## 2022-08-23 DIAGNOSIS — N186 End stage renal disease: Secondary | ICD-10-CM | POA: Diagnosis not present

## 2022-08-24 DIAGNOSIS — Z992 Dependence on renal dialysis: Secondary | ICD-10-CM | POA: Diagnosis not present

## 2022-08-24 DIAGNOSIS — N2581 Secondary hyperparathyroidism of renal origin: Secondary | ICD-10-CM | POA: Diagnosis not present

## 2022-08-24 DIAGNOSIS — D631 Anemia in chronic kidney disease: Secondary | ICD-10-CM | POA: Diagnosis not present

## 2022-08-24 DIAGNOSIS — Z4932 Encounter for adequacy testing for peritoneal dialysis: Secondary | ICD-10-CM | POA: Diagnosis not present

## 2022-08-24 DIAGNOSIS — N186 End stage renal disease: Secondary | ICD-10-CM | POA: Diagnosis not present

## 2022-08-25 DIAGNOSIS — Z4932 Encounter for adequacy testing for peritoneal dialysis: Secondary | ICD-10-CM | POA: Diagnosis not present

## 2022-08-25 DIAGNOSIS — N186 End stage renal disease: Secondary | ICD-10-CM | POA: Diagnosis not present

## 2022-08-25 DIAGNOSIS — N2581 Secondary hyperparathyroidism of renal origin: Secondary | ICD-10-CM | POA: Diagnosis not present

## 2022-08-25 DIAGNOSIS — Z992 Dependence on renal dialysis: Secondary | ICD-10-CM | POA: Diagnosis not present

## 2022-08-25 DIAGNOSIS — D631 Anemia in chronic kidney disease: Secondary | ICD-10-CM | POA: Diagnosis not present

## 2022-08-26 DIAGNOSIS — N186 End stage renal disease: Secondary | ICD-10-CM | POA: Diagnosis not present

## 2022-08-26 DIAGNOSIS — Z4932 Encounter for adequacy testing for peritoneal dialysis: Secondary | ICD-10-CM | POA: Diagnosis not present

## 2022-08-26 DIAGNOSIS — N2581 Secondary hyperparathyroidism of renal origin: Secondary | ICD-10-CM | POA: Diagnosis not present

## 2022-08-26 DIAGNOSIS — D631 Anemia in chronic kidney disease: Secondary | ICD-10-CM | POA: Diagnosis not present

## 2022-08-26 DIAGNOSIS — Z992 Dependence on renal dialysis: Secondary | ICD-10-CM | POA: Diagnosis not present

## 2022-08-27 DIAGNOSIS — Z4932 Encounter for adequacy testing for peritoneal dialysis: Secondary | ICD-10-CM | POA: Diagnosis not present

## 2022-08-27 DIAGNOSIS — N186 End stage renal disease: Secondary | ICD-10-CM | POA: Diagnosis not present

## 2022-08-27 DIAGNOSIS — N2581 Secondary hyperparathyroidism of renal origin: Secondary | ICD-10-CM | POA: Diagnosis not present

## 2022-08-27 DIAGNOSIS — Z992 Dependence on renal dialysis: Secondary | ICD-10-CM | POA: Diagnosis not present

## 2022-08-27 DIAGNOSIS — D631 Anemia in chronic kidney disease: Secondary | ICD-10-CM | POA: Diagnosis not present

## 2022-08-28 DIAGNOSIS — Z4932 Encounter for adequacy testing for peritoneal dialysis: Secondary | ICD-10-CM | POA: Diagnosis not present

## 2022-08-28 DIAGNOSIS — Z992 Dependence on renal dialysis: Secondary | ICD-10-CM | POA: Diagnosis not present

## 2022-08-28 DIAGNOSIS — N2581 Secondary hyperparathyroidism of renal origin: Secondary | ICD-10-CM | POA: Diagnosis not present

## 2022-08-28 DIAGNOSIS — N186 End stage renal disease: Secondary | ICD-10-CM | POA: Diagnosis not present

## 2022-08-28 DIAGNOSIS — D631 Anemia in chronic kidney disease: Secondary | ICD-10-CM | POA: Diagnosis not present

## 2022-08-29 DIAGNOSIS — Z4932 Encounter for adequacy testing for peritoneal dialysis: Secondary | ICD-10-CM | POA: Diagnosis not present

## 2022-08-29 DIAGNOSIS — Z992 Dependence on renal dialysis: Secondary | ICD-10-CM | POA: Diagnosis not present

## 2022-08-29 DIAGNOSIS — N2581 Secondary hyperparathyroidism of renal origin: Secondary | ICD-10-CM | POA: Diagnosis not present

## 2022-08-29 DIAGNOSIS — D631 Anemia in chronic kidney disease: Secondary | ICD-10-CM | POA: Diagnosis not present

## 2022-08-29 DIAGNOSIS — N186 End stage renal disease: Secondary | ICD-10-CM | POA: Diagnosis not present

## 2022-08-30 DIAGNOSIS — N2581 Secondary hyperparathyroidism of renal origin: Secondary | ICD-10-CM | POA: Diagnosis not present

## 2022-08-30 DIAGNOSIS — Z992 Dependence on renal dialysis: Secondary | ICD-10-CM | POA: Diagnosis not present

## 2022-08-30 DIAGNOSIS — N186 End stage renal disease: Secondary | ICD-10-CM | POA: Diagnosis not present

## 2022-08-30 DIAGNOSIS — Z4932 Encounter for adequacy testing for peritoneal dialysis: Secondary | ICD-10-CM | POA: Diagnosis not present

## 2022-08-30 DIAGNOSIS — D631 Anemia in chronic kidney disease: Secondary | ICD-10-CM | POA: Diagnosis not present

## 2022-08-31 DIAGNOSIS — N2581 Secondary hyperparathyroidism of renal origin: Secondary | ICD-10-CM | POA: Diagnosis not present

## 2022-08-31 DIAGNOSIS — N186 End stage renal disease: Secondary | ICD-10-CM | POA: Diagnosis not present

## 2022-08-31 DIAGNOSIS — Z992 Dependence on renal dialysis: Secondary | ICD-10-CM | POA: Diagnosis not present

## 2022-08-31 DIAGNOSIS — Z4932 Encounter for adequacy testing for peritoneal dialysis: Secondary | ICD-10-CM | POA: Diagnosis not present

## 2022-08-31 DIAGNOSIS — D631 Anemia in chronic kidney disease: Secondary | ICD-10-CM | POA: Diagnosis not present

## 2022-09-01 DIAGNOSIS — D631 Anemia in chronic kidney disease: Secondary | ICD-10-CM | POA: Diagnosis not present

## 2022-09-01 DIAGNOSIS — Z992 Dependence on renal dialysis: Secondary | ICD-10-CM | POA: Diagnosis not present

## 2022-09-01 DIAGNOSIS — Z4932 Encounter for adequacy testing for peritoneal dialysis: Secondary | ICD-10-CM | POA: Diagnosis not present

## 2022-09-01 DIAGNOSIS — N2581 Secondary hyperparathyroidism of renal origin: Secondary | ICD-10-CM | POA: Diagnosis not present

## 2022-09-01 DIAGNOSIS — N186 End stage renal disease: Secondary | ICD-10-CM | POA: Diagnosis not present

## 2022-09-02 DIAGNOSIS — D631 Anemia in chronic kidney disease: Secondary | ICD-10-CM | POA: Diagnosis not present

## 2022-09-02 DIAGNOSIS — Z4932 Encounter for adequacy testing for peritoneal dialysis: Secondary | ICD-10-CM | POA: Diagnosis not present

## 2022-09-02 DIAGNOSIS — Z992 Dependence on renal dialysis: Secondary | ICD-10-CM | POA: Diagnosis not present

## 2022-09-02 DIAGNOSIS — N2581 Secondary hyperparathyroidism of renal origin: Secondary | ICD-10-CM | POA: Diagnosis not present

## 2022-09-02 DIAGNOSIS — N186 End stage renal disease: Secondary | ICD-10-CM | POA: Diagnosis not present

## 2022-09-03 DIAGNOSIS — Z992 Dependence on renal dialysis: Secondary | ICD-10-CM | POA: Diagnosis not present

## 2022-09-03 DIAGNOSIS — Z4932 Encounter for adequacy testing for peritoneal dialysis: Secondary | ICD-10-CM | POA: Diagnosis not present

## 2022-09-03 DIAGNOSIS — N2581 Secondary hyperparathyroidism of renal origin: Secondary | ICD-10-CM | POA: Diagnosis not present

## 2022-09-03 DIAGNOSIS — D631 Anemia in chronic kidney disease: Secondary | ICD-10-CM | POA: Diagnosis not present

## 2022-09-03 DIAGNOSIS — N186 End stage renal disease: Secondary | ICD-10-CM | POA: Diagnosis not present

## 2022-09-04 DIAGNOSIS — Z4932 Encounter for adequacy testing for peritoneal dialysis: Secondary | ICD-10-CM | POA: Diagnosis not present

## 2022-09-04 DIAGNOSIS — N2581 Secondary hyperparathyroidism of renal origin: Secondary | ICD-10-CM | POA: Diagnosis not present

## 2022-09-04 DIAGNOSIS — D631 Anemia in chronic kidney disease: Secondary | ICD-10-CM | POA: Diagnosis not present

## 2022-09-04 DIAGNOSIS — N186 End stage renal disease: Secondary | ICD-10-CM | POA: Diagnosis not present

## 2022-09-04 DIAGNOSIS — Z992 Dependence on renal dialysis: Secondary | ICD-10-CM | POA: Diagnosis not present

## 2022-09-05 DIAGNOSIS — D631 Anemia in chronic kidney disease: Secondary | ICD-10-CM | POA: Diagnosis not present

## 2022-09-05 DIAGNOSIS — Z4932 Encounter for adequacy testing for peritoneal dialysis: Secondary | ICD-10-CM | POA: Diagnosis not present

## 2022-09-05 DIAGNOSIS — N2581 Secondary hyperparathyroidism of renal origin: Secondary | ICD-10-CM | POA: Diagnosis not present

## 2022-09-05 DIAGNOSIS — N186 End stage renal disease: Secondary | ICD-10-CM | POA: Diagnosis not present

## 2022-09-05 DIAGNOSIS — Z992 Dependence on renal dialysis: Secondary | ICD-10-CM | POA: Diagnosis not present

## 2022-09-06 DIAGNOSIS — Z992 Dependence on renal dialysis: Secondary | ICD-10-CM | POA: Diagnosis not present

## 2022-09-06 DIAGNOSIS — D631 Anemia in chronic kidney disease: Secondary | ICD-10-CM | POA: Diagnosis not present

## 2022-09-06 DIAGNOSIS — N2581 Secondary hyperparathyroidism of renal origin: Secondary | ICD-10-CM | POA: Diagnosis not present

## 2022-09-06 DIAGNOSIS — Z4932 Encounter for adequacy testing for peritoneal dialysis: Secondary | ICD-10-CM | POA: Diagnosis not present

## 2022-09-06 DIAGNOSIS — N186 End stage renal disease: Secondary | ICD-10-CM | POA: Diagnosis not present

## 2022-09-07 DIAGNOSIS — Z992 Dependence on renal dialysis: Secondary | ICD-10-CM | POA: Diagnosis not present

## 2022-09-07 DIAGNOSIS — Z4932 Encounter for adequacy testing for peritoneal dialysis: Secondary | ICD-10-CM | POA: Diagnosis not present

## 2022-09-07 DIAGNOSIS — D631 Anemia in chronic kidney disease: Secondary | ICD-10-CM | POA: Diagnosis not present

## 2022-09-07 DIAGNOSIS — N186 End stage renal disease: Secondary | ICD-10-CM | POA: Diagnosis not present

## 2022-09-07 DIAGNOSIS — N2581 Secondary hyperparathyroidism of renal origin: Secondary | ICD-10-CM | POA: Diagnosis not present

## 2022-09-08 DIAGNOSIS — N2581 Secondary hyperparathyroidism of renal origin: Secondary | ICD-10-CM | POA: Diagnosis not present

## 2022-09-08 DIAGNOSIS — Z992 Dependence on renal dialysis: Secondary | ICD-10-CM | POA: Diagnosis not present

## 2022-09-08 DIAGNOSIS — Z4932 Encounter for adequacy testing for peritoneal dialysis: Secondary | ICD-10-CM | POA: Diagnosis not present

## 2022-09-08 DIAGNOSIS — D631 Anemia in chronic kidney disease: Secondary | ICD-10-CM | POA: Diagnosis not present

## 2022-09-08 DIAGNOSIS — N186 End stage renal disease: Secondary | ICD-10-CM | POA: Diagnosis not present

## 2022-09-09 DIAGNOSIS — Z4932 Encounter for adequacy testing for peritoneal dialysis: Secondary | ICD-10-CM | POA: Diagnosis not present

## 2022-09-09 DIAGNOSIS — N2581 Secondary hyperparathyroidism of renal origin: Secondary | ICD-10-CM | POA: Diagnosis not present

## 2022-09-09 DIAGNOSIS — N186 End stage renal disease: Secondary | ICD-10-CM | POA: Diagnosis not present

## 2022-09-09 DIAGNOSIS — Z992 Dependence on renal dialysis: Secondary | ICD-10-CM | POA: Diagnosis not present

## 2022-09-09 DIAGNOSIS — D631 Anemia in chronic kidney disease: Secondary | ICD-10-CM | POA: Diagnosis not present

## 2022-09-10 DIAGNOSIS — Z4932 Encounter for adequacy testing for peritoneal dialysis: Secondary | ICD-10-CM | POA: Diagnosis not present

## 2022-09-10 DIAGNOSIS — N2581 Secondary hyperparathyroidism of renal origin: Secondary | ICD-10-CM | POA: Diagnosis not present

## 2022-09-10 DIAGNOSIS — Z992 Dependence on renal dialysis: Secondary | ICD-10-CM | POA: Diagnosis not present

## 2022-09-10 DIAGNOSIS — N186 End stage renal disease: Secondary | ICD-10-CM | POA: Diagnosis not present

## 2022-09-10 DIAGNOSIS — D631 Anemia in chronic kidney disease: Secondary | ICD-10-CM | POA: Diagnosis not present

## 2022-09-11 DIAGNOSIS — N2581 Secondary hyperparathyroidism of renal origin: Secondary | ICD-10-CM | POA: Diagnosis not present

## 2022-09-11 DIAGNOSIS — D631 Anemia in chronic kidney disease: Secondary | ICD-10-CM | POA: Diagnosis not present

## 2022-09-11 DIAGNOSIS — Z4932 Encounter for adequacy testing for peritoneal dialysis: Secondary | ICD-10-CM | POA: Diagnosis not present

## 2022-09-11 DIAGNOSIS — Z992 Dependence on renal dialysis: Secondary | ICD-10-CM | POA: Diagnosis not present

## 2022-09-11 DIAGNOSIS — N186 End stage renal disease: Secondary | ICD-10-CM | POA: Diagnosis not present

## 2022-09-12 DIAGNOSIS — Z4932 Encounter for adequacy testing for peritoneal dialysis: Secondary | ICD-10-CM | POA: Diagnosis not present

## 2022-09-12 DIAGNOSIS — N186 End stage renal disease: Secondary | ICD-10-CM | POA: Diagnosis not present

## 2022-09-12 DIAGNOSIS — N2581 Secondary hyperparathyroidism of renal origin: Secondary | ICD-10-CM | POA: Diagnosis not present

## 2022-09-12 DIAGNOSIS — D631 Anemia in chronic kidney disease: Secondary | ICD-10-CM | POA: Diagnosis not present

## 2022-09-12 DIAGNOSIS — Z992 Dependence on renal dialysis: Secondary | ICD-10-CM | POA: Diagnosis not present

## 2022-09-13 DIAGNOSIS — E1122 Type 2 diabetes mellitus with diabetic chronic kidney disease: Secondary | ICD-10-CM | POA: Diagnosis not present

## 2022-09-13 DIAGNOSIS — D631 Anemia in chronic kidney disease: Secondary | ICD-10-CM | POA: Diagnosis not present

## 2022-09-13 DIAGNOSIS — N186 End stage renal disease: Secondary | ICD-10-CM | POA: Diagnosis not present

## 2022-09-13 DIAGNOSIS — Z992 Dependence on renal dialysis: Secondary | ICD-10-CM | POA: Diagnosis not present

## 2022-09-13 DIAGNOSIS — Z4932 Encounter for adequacy testing for peritoneal dialysis: Secondary | ICD-10-CM | POA: Diagnosis not present

## 2022-09-13 DIAGNOSIS — N2581 Secondary hyperparathyroidism of renal origin: Secondary | ICD-10-CM | POA: Diagnosis not present

## 2022-09-14 DIAGNOSIS — D631 Anemia in chronic kidney disease: Secondary | ICD-10-CM | POA: Diagnosis not present

## 2022-09-14 DIAGNOSIS — Z992 Dependence on renal dialysis: Secondary | ICD-10-CM | POA: Diagnosis not present

## 2022-09-14 DIAGNOSIS — N186 End stage renal disease: Secondary | ICD-10-CM | POA: Diagnosis not present

## 2022-09-14 DIAGNOSIS — N2581 Secondary hyperparathyroidism of renal origin: Secondary | ICD-10-CM | POA: Diagnosis not present

## 2022-09-14 DIAGNOSIS — Z4932 Encounter for adequacy testing for peritoneal dialysis: Secondary | ICD-10-CM | POA: Diagnosis not present

## 2022-09-15 DIAGNOSIS — N2581 Secondary hyperparathyroidism of renal origin: Secondary | ICD-10-CM | POA: Diagnosis not present

## 2022-09-15 DIAGNOSIS — Z4932 Encounter for adequacy testing for peritoneal dialysis: Secondary | ICD-10-CM | POA: Diagnosis not present

## 2022-09-15 DIAGNOSIS — N186 End stage renal disease: Secondary | ICD-10-CM | POA: Diagnosis not present

## 2022-09-15 DIAGNOSIS — D631 Anemia in chronic kidney disease: Secondary | ICD-10-CM | POA: Diagnosis not present

## 2022-09-15 DIAGNOSIS — Z992 Dependence on renal dialysis: Secondary | ICD-10-CM | POA: Diagnosis not present

## 2022-09-16 DIAGNOSIS — Z992 Dependence on renal dialysis: Secondary | ICD-10-CM | POA: Diagnosis not present

## 2022-09-16 DIAGNOSIS — D631 Anemia in chronic kidney disease: Secondary | ICD-10-CM | POA: Diagnosis not present

## 2022-09-16 DIAGNOSIS — N186 End stage renal disease: Secondary | ICD-10-CM | POA: Diagnosis not present

## 2022-09-16 DIAGNOSIS — Z4932 Encounter for adequacy testing for peritoneal dialysis: Secondary | ICD-10-CM | POA: Diagnosis not present

## 2022-09-16 DIAGNOSIS — N2581 Secondary hyperparathyroidism of renal origin: Secondary | ICD-10-CM | POA: Diagnosis not present

## 2022-09-17 DIAGNOSIS — D631 Anemia in chronic kidney disease: Secondary | ICD-10-CM | POA: Diagnosis not present

## 2022-09-17 DIAGNOSIS — N2581 Secondary hyperparathyroidism of renal origin: Secondary | ICD-10-CM | POA: Diagnosis not present

## 2022-09-17 DIAGNOSIS — Z992 Dependence on renal dialysis: Secondary | ICD-10-CM | POA: Diagnosis not present

## 2022-09-17 DIAGNOSIS — Z4932 Encounter for adequacy testing for peritoneal dialysis: Secondary | ICD-10-CM | POA: Diagnosis not present

## 2022-09-17 DIAGNOSIS — N186 End stage renal disease: Secondary | ICD-10-CM | POA: Diagnosis not present

## 2022-09-18 DIAGNOSIS — D631 Anemia in chronic kidney disease: Secondary | ICD-10-CM | POA: Diagnosis not present

## 2022-09-18 DIAGNOSIS — N186 End stage renal disease: Secondary | ICD-10-CM | POA: Diagnosis not present

## 2022-09-18 DIAGNOSIS — Z4932 Encounter for adequacy testing for peritoneal dialysis: Secondary | ICD-10-CM | POA: Diagnosis not present

## 2022-09-18 DIAGNOSIS — Z992 Dependence on renal dialysis: Secondary | ICD-10-CM | POA: Diagnosis not present

## 2022-09-18 DIAGNOSIS — N2581 Secondary hyperparathyroidism of renal origin: Secondary | ICD-10-CM | POA: Diagnosis not present

## 2022-09-19 DIAGNOSIS — Z992 Dependence on renal dialysis: Secondary | ICD-10-CM | POA: Diagnosis not present

## 2022-09-19 DIAGNOSIS — N2581 Secondary hyperparathyroidism of renal origin: Secondary | ICD-10-CM | POA: Diagnosis not present

## 2022-09-19 DIAGNOSIS — N186 End stage renal disease: Secondary | ICD-10-CM | POA: Diagnosis not present

## 2022-09-19 DIAGNOSIS — D631 Anemia in chronic kidney disease: Secondary | ICD-10-CM | POA: Diagnosis not present

## 2022-09-19 DIAGNOSIS — Z4932 Encounter for adequacy testing for peritoneal dialysis: Secondary | ICD-10-CM | POA: Diagnosis not present

## 2022-09-20 DIAGNOSIS — D631 Anemia in chronic kidney disease: Secondary | ICD-10-CM | POA: Diagnosis not present

## 2022-09-20 DIAGNOSIS — N2581 Secondary hyperparathyroidism of renal origin: Secondary | ICD-10-CM | POA: Diagnosis not present

## 2022-09-20 DIAGNOSIS — Z992 Dependence on renal dialysis: Secondary | ICD-10-CM | POA: Diagnosis not present

## 2022-09-20 DIAGNOSIS — Z4932 Encounter for adequacy testing for peritoneal dialysis: Secondary | ICD-10-CM | POA: Diagnosis not present

## 2022-09-20 DIAGNOSIS — N186 End stage renal disease: Secondary | ICD-10-CM | POA: Diagnosis not present

## 2022-09-21 ENCOUNTER — Ambulatory Visit: Payer: Medicare Other | Attending: Cardiology | Admitting: Cardiology

## 2022-09-21 ENCOUNTER — Encounter: Payer: Self-pay | Admitting: Cardiology

## 2022-09-21 VITALS — BP 114/83 | HR 81 | Ht 67.0 in | Wt 274.8 lb

## 2022-09-21 DIAGNOSIS — Z4932 Encounter for adequacy testing for peritoneal dialysis: Secondary | ICD-10-CM | POA: Diagnosis not present

## 2022-09-21 DIAGNOSIS — G4700 Insomnia, unspecified: Secondary | ICD-10-CM

## 2022-09-21 DIAGNOSIS — G4733 Obstructive sleep apnea (adult) (pediatric): Secondary | ICD-10-CM

## 2022-09-21 DIAGNOSIS — N186 End stage renal disease: Secondary | ICD-10-CM | POA: Diagnosis not present

## 2022-09-21 DIAGNOSIS — I5032 Chronic diastolic (congestive) heart failure: Secondary | ICD-10-CM

## 2022-09-21 DIAGNOSIS — Z992 Dependence on renal dialysis: Secondary | ICD-10-CM | POA: Diagnosis not present

## 2022-09-21 DIAGNOSIS — R011 Cardiac murmur, unspecified: Secondary | ICD-10-CM

## 2022-09-21 DIAGNOSIS — N2581 Secondary hyperparathyroidism of renal origin: Secondary | ICD-10-CM | POA: Diagnosis not present

## 2022-09-21 DIAGNOSIS — D631 Anemia in chronic kidney disease: Secondary | ICD-10-CM | POA: Diagnosis not present

## 2022-09-21 DIAGNOSIS — R0989 Other specified symptoms and signs involving the circulatory and respiratory systems: Secondary | ICD-10-CM

## 2022-09-21 DIAGNOSIS — I10 Essential (primary) hypertension: Secondary | ICD-10-CM

## 2022-09-21 NOTE — Patient Instructions (Signed)
Medication Instructions:  Your physician recommends that you continue on your current medications as directed. Please refer to the Current Medication list given to you today.  *If you need a refill on your cardiac medications before your next appointment, please call your pharmacy*   Lab Work: None.  If you have labs (blood work) drawn today and your tests are completely normal, you will receive your results only by: MyChart Message (if you have MyChart) OR A paper copy in the mail If you have any lab test that is abnormal or we need to change your treatment, we will call you to review the results.   Testing/Procedures: Your physician has requested that you have an echocardiogram. Echocardiography is a painless test that uses sound waves to create images of your heart. It provides your doctor with information about the size and shape of your heart and how well your heart's chambers and valves are working. This procedure takes approximately one hour. There are no restrictions for this procedure. Please do NOT wear cologne, perfume, aftershave, or lotions (deodorant is allowed). Please arrive 15 minutes prior to your appointment time.  Your physician has requested that you have a carotid duplex. This test is an ultrasound of the carotid arteries in your neck. It looks at blood flow through these arteries that supply the brain with blood. Allow one hour for this exam. There are no restrictions or special instructions.    Follow-Up:  Your next appointment:   1 year(s)  Provider:   Dr. Armanda Magic, MD

## 2022-09-21 NOTE — Progress Notes (Addendum)
Date:  09/15/2021   ID:  Adrian Frye, DOB 20-May-1970, MRN [id number]   Cardiology Office Note:    Date:  09/21/2022   ID:  Adrian Frye, DOB [birth year], MRN [id number]  PCP:  Adrian Aspen, MD  Cardiologist:  None  He  Referring MD: Adrian Frye, *   Chief Complaint  Patient presents with   Congestive Heart Failure   Hypertension   Sleep Apnea    History of Present Illness:    Adrian Frye is a 52 y.o. male with a hx of  DCM, HTN, OSA on CPAP and obesity.  He is here today for followup and is doing well.  He denies any chest pain or pressure, SOB, DOE, PND, orthopnea, LE edema, dizziness, palpitations or syncope. his is compliant with He meds and is tolerating meds with no SE.    He is doing well with his PAP device and thinks that he has gotten used to it.  He tolerates the mask and feels the pressure is adequate.  Since going on PAP he feels rested in the am and has no significant daytime sleepiness.  He denies any significant mouth or nasal dryness or nasal congestion.  He does not think that he snores.   He has been on Trazadone 150mg  nightly which helps him sleep through the night.   Past Medical History:  Diagnosis Date   Allergic rhinitis    Allergy    Anemia    Asthma    as a child   Bacteremia    Benign essential hypertension    CHF (congestive heart failure) (HCC)    Dilated idiopathic cardiomyopathy (HCC)    now resoved with EF 55% by echo 2011   Edema    Encounter for blood transfusion    Endocarditis    ESRD (end stage renal disease) (HCC)    ESRD on peritoneal dialysis (HCC)    Heart disease    Hyperlipidemia    Hyperparathyroidism, secondary (HCC)    Hypertension    Morbid obesity (HCC)    Sleep apnea    c-pap   UGI bleed 2011   ASA    Past Surgical History:  Procedure Laterality Date   ACHILLES TENDON REPAIR     ruptured; right   AV FISTULA PLACEMENT Left 11/03/2017   Procedure: ARTERIOVENOUS  (AV) FISTULA CREATION  left upper arm;  Surgeon: Larina Earthly, MD;  Location: [facility] OR;  Service: Vascular;  Laterality: Left;   COLONOSCOPY     COLONOSCOPY WITH PROPOFOL N/A 08/11/2022   Procedure: COLONOSCOPY WITH PROPOFOL;  Surgeon: Hilarie Fredrickson, MD;  Location: [facility];  Service: Gastroenterology;  Laterality: N/A;   DIALYSIS FISTULA CREATION     INSERTION OF DIALYSIS CATHETER N/A 11/03/2017   Procedure: INSERTION OF DIALYSIS CATHETER - RIGHT INTERNAL JUGULAR PLACEMENT;  Surgeon: Larina Earthly, MD;  Location: [facility] OR;  Service: Vascular;  Laterality: N/A;   IR FLUORO GUIDE CV LINE LEFT  01/24/2020   IR REMOVAL TUN CV CATH W/O FL  01/21/2020   IR REMOVAL TUN CV CATH W/O FL  03/06/2020   IR US GUIDE VASC ACCESS LEFT  01/24/2020   KIDNEY FAILURE     LAPAROSCOPIC GASTROTOMY W/ REPAIR OF ULCER     PLEURAL SCARIFICATION     left, football trauma-chest tube   STOMACH SURGERY     TEE WITHOUT CARDIOVERSION N/A 01/22/2020   Procedure: TRANSESOPHAGEAL ECHOCARDIOGRAM (TEE);  Surgeon: Rennis Golden,  Lisette Abu, MD;  Location: [facility];  Service: Cardiovascular;  Laterality: N/A;   TONSILLECTOMY     TONSILLECTOMY     UPPER GASTROINTESTINAL ENDOSCOPY      Current Medications: Current Meds  Medication Sig   albuterol (VENTOLIN HFA) 108 (90 Base) MCG/ACT inhaler TAKE 2 PUFFS BY MOUTH EVERY 6 HOURS AS NEEDED FOR WHEEZE OR SHORTNESS OF BREATH   amLODipine (NORVASC) 2.5 MG tablet Take 1 tablet (2.5 mg total) by mouth daily.   AURYXIA 1 GM 210 MG(Fe) tablet Take 630 mg by mouth 3 (three) times daily.   B Complex-C-Folic Acid (DIALYVITE 800 PO) Take 1 tablet by mouth daily.   calcitRIOL (ROCALTROL) 0.5 MCG capsule Take 0.5 mcg by mouth daily.   gentamicin cream (GARAMYCIN) 0.1 % Apply 1 Application topically daily as needed (Catheter).   Methoxy PEG-Epoetin Beta (MIRCERA IJ) Inject into the skin. As needed   sildenafil (REVATIO) 20 MG tablet Take 1 tablet (20 mg total) by mouth 3 (three) times daily.    traZODone (DESYREL) 50 MG tablet Take 100 mg by mouth at bedtime as needed for sleep.     Allergies:   Ace inhibitors, Lisinopril, and Losartan   Social History   Socioeconomic History   Marital status: Married    Spouse name: Not on file   Number of children: 2   Years of education: Not on file   Highest education level: Not on file  Occupational History   Occupation: real Psychologist, occupational  Tobacco Use   Smoking status: Former    Years: 6    Types: Cigarettes    Quit date: 05/16/2009    Years since quitting: 13.3   Smokeless tobacco: Never   Tobacco comments:    smoked 1 pack per week   Vaping Use   Vaping Use: Never used  Substance and Sexual Activity   Alcohol use: Not Currently    Alcohol/week: 2.0 standard drinks of alcohol    Types: 2 Cans of beer per week    Comment: occasionally   Drug use: No   Sexual activity: Yes  Other Topics Concern   Not on file  Social History Narrative   Not on file   Social Determinants of Health   Financial Resource Strain: Low Risk  (03/11/2022)   Overall Financial Resource Strain (CARDIA)    Difficulty of Paying Living Expenses: Not hard at all  Food Insecurity: No Food Insecurity (03/11/2022)   Hunger Vital Sign    Worried About Running Out of Food in the Last Year: Never true    Ran Out of Food in the Last Year: Never true  Transportation Needs: No Transportation Needs (03/11/2022)   PRAPARE - Administrator, Civil Service (Medical): No    Lack of Transportation (Non-Medical): No  Physical Activity: Sufficiently Active (03/11/2022)   Exercise Vital Sign    Days of Exercise per Week: 3 days    Minutes of Exercise per Session: 60 min  Stress: No Stress Concern Present (03/11/2022)   [employer] of Occupational Health - Occupational Stress Questionnaire    Feeling of Stress : Not at all  Social Connections: Not on file     Family History: The patient's family history includes Diabetes in his mother; Heart  disease in his father; Heart failure in his mother; Hypertension in his brother, brother, and mother. There is no history of Colon cancer, Esophageal cancer, Rectal cancer, or Stomach cancer.  ROS:   Please see the history of  present illness.    ROS  All other systems reviewed and negative.   EKGs/Labs/Other Studies Reviewed:    The following studies were reviewed today: PAP compliance download  EKG:  EKG is ordered today and demonstrated NSR with LPFB  Recent Labs: 08/11/2022: BUN 67; Creatinine, Ser >18.00; Hemoglobin 8.8; Potassium 3.4; Sodium 138   Recent Lipid Panel No results found for: "CHOL", "TRIG", "HDL", "CHOLHDL", "VLDL", "LDLCALC", "LDLDIRECT"   Physical Exam:    VS:  BP 114/83   Pulse 81   Ht 5\' 7"  (1.702 m)   Wt 274 lb 12.8 oz (124.6 kg)   SpO2 90%   BMI 43.04 kg/m     Wt Readings from Last 3 Encounters:  09/21/22 274 lb 12.8 oz (124.6 kg)  08/11/22 268 lb 1.3 oz (121.6 kg)  08/02/22 268 lb (121.6 kg)     GEN: Well nourished, well developed in no acute distress HEENT: Normal NECK: No JVD; left carotid artery bruit LYMPHATICS: No lymphadenopathy CARDIAC:RRR, no rubs, gallops 2/t SB at RUSB to LLSB RESPIRATORY:  Clear to auscultation without rales, wheezing or rhonchi  ABDOMEN: Soft, non-tender, non-distended MUSCULOSKELETAL:  No edema; No deformity  SKIN: Warm and dry NEUROLOGIC:  Alert and oriented x 3 PSYCHIATRIC:  Normal affect  ASSESSMENT:    1. OSA on CPAP   2. Primary hypertension   3. Insomnia, unspecified type   4. Chronic diastolic heart failure (HCC)   5. Left carotid bruit   6. Heart murmur    PLAN:    In order of problems listed above:  OSA - The patient is tolerating PAP therapy well without any problems. The PAP download performed by his DME was personally reviewed and interpreted by me today and showed an AHI of 0.7/hr on 20 cm H2O with 100% compliance in using more than 4 hours nightly.  The patient has been using and  benefiting from PAP use and will continue to benefit from therapy.   HTN -BP is on exam today -Continue prescription drug management with amlodipine 2.5 mg daily with as needed refills  Insomnia -This is adequately rolled on trazodone -Continue trazodone 150 mg nightly as needed for sleep insomnia  Chronic diastolic CHF -He appears euvolemic on exam today -He has not required any diuretic therapy  Left carotid artery bruit -his AVF is on the left so that may be the etiology of his bruit -will get carotid dopplers  Heart Murmur -will check 2D echo   Time Spent: 20 minutes total time of encounter, including 15 minutes spent in face-to-face patient care on the date of this encounter.   Medication Adjustments/Labs and Tests Ordered: Current medicines are reviewed at length with the patient today.  Concerns regarding medicines are outlined above.  Orders Placed This Encounter  Procedures   EKG 12-Lead   No orders of the defined types were placed in this encounter.   Signed, Armanda Magic, MD  09/21/2022 10:08 AM    [facility]

## 2022-09-21 NOTE — Addendum Note (Signed)
Addended by: Rexene Edison L on: 09/21/2022 10:14 AM   Modules accepted: Orders

## 2022-09-22 DIAGNOSIS — Z4932 Encounter for adequacy testing for peritoneal dialysis: Secondary | ICD-10-CM | POA: Diagnosis not present

## 2022-09-22 DIAGNOSIS — D631 Anemia in chronic kidney disease: Secondary | ICD-10-CM | POA: Diagnosis not present

## 2022-09-22 DIAGNOSIS — Z992 Dependence on renal dialysis: Secondary | ICD-10-CM | POA: Diagnosis not present

## 2022-09-22 DIAGNOSIS — N186 End stage renal disease: Secondary | ICD-10-CM | POA: Diagnosis not present

## 2022-09-22 DIAGNOSIS — N2581 Secondary hyperparathyroidism of renal origin: Secondary | ICD-10-CM | POA: Diagnosis not present

## 2022-09-23 DIAGNOSIS — Z4932 Encounter for adequacy testing for peritoneal dialysis: Secondary | ICD-10-CM | POA: Diagnosis not present

## 2022-09-23 DIAGNOSIS — N2581 Secondary hyperparathyroidism of renal origin: Secondary | ICD-10-CM | POA: Diagnosis not present

## 2022-09-23 DIAGNOSIS — N186 End stage renal disease: Secondary | ICD-10-CM | POA: Diagnosis not present

## 2022-09-23 DIAGNOSIS — Z992 Dependence on renal dialysis: Secondary | ICD-10-CM | POA: Diagnosis not present

## 2022-09-23 DIAGNOSIS — D631 Anemia in chronic kidney disease: Secondary | ICD-10-CM | POA: Diagnosis not present

## 2022-09-24 DIAGNOSIS — Z992 Dependence on renal dialysis: Secondary | ICD-10-CM | POA: Diagnosis not present

## 2022-09-24 DIAGNOSIS — Z4932 Encounter for adequacy testing for peritoneal dialysis: Secondary | ICD-10-CM | POA: Diagnosis not present

## 2022-09-24 DIAGNOSIS — D631 Anemia in chronic kidney disease: Secondary | ICD-10-CM | POA: Diagnosis not present

## 2022-09-24 DIAGNOSIS — N2581 Secondary hyperparathyroidism of renal origin: Secondary | ICD-10-CM | POA: Diagnosis not present

## 2022-09-24 DIAGNOSIS — N186 End stage renal disease: Secondary | ICD-10-CM | POA: Diagnosis not present

## 2022-09-25 DIAGNOSIS — Z992 Dependence on renal dialysis: Secondary | ICD-10-CM | POA: Diagnosis not present

## 2022-09-25 DIAGNOSIS — Z4932 Encounter for adequacy testing for peritoneal dialysis: Secondary | ICD-10-CM | POA: Diagnosis not present

## 2022-09-25 DIAGNOSIS — N2581 Secondary hyperparathyroidism of renal origin: Secondary | ICD-10-CM | POA: Diagnosis not present

## 2022-09-25 DIAGNOSIS — N186 End stage renal disease: Secondary | ICD-10-CM | POA: Diagnosis not present

## 2022-09-25 DIAGNOSIS — D631 Anemia in chronic kidney disease: Secondary | ICD-10-CM | POA: Diagnosis not present

## 2022-09-26 DIAGNOSIS — D631 Anemia in chronic kidney disease: Secondary | ICD-10-CM | POA: Diagnosis not present

## 2022-09-26 DIAGNOSIS — Z992 Dependence on renal dialysis: Secondary | ICD-10-CM | POA: Diagnosis not present

## 2022-09-26 DIAGNOSIS — N2581 Secondary hyperparathyroidism of renal origin: Secondary | ICD-10-CM | POA: Diagnosis not present

## 2022-09-26 DIAGNOSIS — N186 End stage renal disease: Secondary | ICD-10-CM | POA: Diagnosis not present

## 2022-09-26 DIAGNOSIS — Z4932 Encounter for adequacy testing for peritoneal dialysis: Secondary | ICD-10-CM | POA: Diagnosis not present

## 2022-09-27 DIAGNOSIS — Z992 Dependence on renal dialysis: Secondary | ICD-10-CM | POA: Diagnosis not present

## 2022-09-27 DIAGNOSIS — N186 End stage renal disease: Secondary | ICD-10-CM | POA: Diagnosis not present

## 2022-09-27 DIAGNOSIS — Z4932 Encounter for adequacy testing for peritoneal dialysis: Secondary | ICD-10-CM | POA: Diagnosis not present

## 2022-09-27 DIAGNOSIS — D631 Anemia in chronic kidney disease: Secondary | ICD-10-CM | POA: Diagnosis not present

## 2022-09-27 DIAGNOSIS — N2581 Secondary hyperparathyroidism of renal origin: Secondary | ICD-10-CM | POA: Diagnosis not present

## 2022-09-28 DIAGNOSIS — Z4932 Encounter for adequacy testing for peritoneal dialysis: Secondary | ICD-10-CM | POA: Diagnosis not present

## 2022-09-28 DIAGNOSIS — N186 End stage renal disease: Secondary | ICD-10-CM | POA: Diagnosis not present

## 2022-09-28 DIAGNOSIS — Z992 Dependence on renal dialysis: Secondary | ICD-10-CM | POA: Diagnosis not present

## 2022-09-28 DIAGNOSIS — K080 Exfoliation of teeth due to systemic causes: Secondary | ICD-10-CM | POA: Diagnosis not present

## 2022-09-28 DIAGNOSIS — N2581 Secondary hyperparathyroidism of renal origin: Secondary | ICD-10-CM | POA: Diagnosis not present

## 2022-09-28 DIAGNOSIS — D631 Anemia in chronic kidney disease: Secondary | ICD-10-CM | POA: Diagnosis not present

## 2022-09-29 DIAGNOSIS — N186 End stage renal disease: Secondary | ICD-10-CM | POA: Diagnosis not present

## 2022-09-29 DIAGNOSIS — Z4932 Encounter for adequacy testing for peritoneal dialysis: Secondary | ICD-10-CM | POA: Diagnosis not present

## 2022-09-29 DIAGNOSIS — D631 Anemia in chronic kidney disease: Secondary | ICD-10-CM | POA: Diagnosis not present

## 2022-09-29 DIAGNOSIS — Z992 Dependence on renal dialysis: Secondary | ICD-10-CM | POA: Diagnosis not present

## 2022-09-29 DIAGNOSIS — N2581 Secondary hyperparathyroidism of renal origin: Secondary | ICD-10-CM | POA: Diagnosis not present

## 2022-09-30 DIAGNOSIS — Z992 Dependence on renal dialysis: Secondary | ICD-10-CM | POA: Diagnosis not present

## 2022-09-30 DIAGNOSIS — D631 Anemia in chronic kidney disease: Secondary | ICD-10-CM | POA: Diagnosis not present

## 2022-09-30 DIAGNOSIS — N2581 Secondary hyperparathyroidism of renal origin: Secondary | ICD-10-CM | POA: Diagnosis not present

## 2022-09-30 DIAGNOSIS — N186 End stage renal disease: Secondary | ICD-10-CM | POA: Diagnosis not present

## 2022-09-30 DIAGNOSIS — Z4932 Encounter for adequacy testing for peritoneal dialysis: Secondary | ICD-10-CM | POA: Diagnosis not present

## 2022-10-01 DIAGNOSIS — Z4932 Encounter for adequacy testing for peritoneal dialysis: Secondary | ICD-10-CM | POA: Diagnosis not present

## 2022-10-01 DIAGNOSIS — D631 Anemia in chronic kidney disease: Secondary | ICD-10-CM | POA: Diagnosis not present

## 2022-10-01 DIAGNOSIS — N186 End stage renal disease: Secondary | ICD-10-CM | POA: Diagnosis not present

## 2022-10-01 DIAGNOSIS — Z992 Dependence on renal dialysis: Secondary | ICD-10-CM | POA: Diagnosis not present

## 2022-10-01 DIAGNOSIS — N2581 Secondary hyperparathyroidism of renal origin: Secondary | ICD-10-CM | POA: Diagnosis not present

## 2022-10-02 DIAGNOSIS — D631 Anemia in chronic kidney disease: Secondary | ICD-10-CM | POA: Diagnosis not present

## 2022-10-02 DIAGNOSIS — N186 End stage renal disease: Secondary | ICD-10-CM | POA: Diagnosis not present

## 2022-10-02 DIAGNOSIS — Z4932 Encounter for adequacy testing for peritoneal dialysis: Secondary | ICD-10-CM | POA: Diagnosis not present

## 2022-10-02 DIAGNOSIS — N2581 Secondary hyperparathyroidism of renal origin: Secondary | ICD-10-CM | POA: Diagnosis not present

## 2022-10-02 DIAGNOSIS — Z992 Dependence on renal dialysis: Secondary | ICD-10-CM | POA: Diagnosis not present

## 2022-10-03 DIAGNOSIS — Z992 Dependence on renal dialysis: Secondary | ICD-10-CM | POA: Diagnosis not present

## 2022-10-03 DIAGNOSIS — D631 Anemia in chronic kidney disease: Secondary | ICD-10-CM | POA: Diagnosis not present

## 2022-10-03 DIAGNOSIS — N186 End stage renal disease: Secondary | ICD-10-CM | POA: Diagnosis not present

## 2022-10-03 DIAGNOSIS — N2581 Secondary hyperparathyroidism of renal origin: Secondary | ICD-10-CM | POA: Diagnosis not present

## 2022-10-03 DIAGNOSIS — Z4932 Encounter for adequacy testing for peritoneal dialysis: Secondary | ICD-10-CM | POA: Diagnosis not present

## 2022-10-04 DIAGNOSIS — D631 Anemia in chronic kidney disease: Secondary | ICD-10-CM | POA: Diagnosis not present

## 2022-10-04 DIAGNOSIS — N186 End stage renal disease: Secondary | ICD-10-CM | POA: Diagnosis not present

## 2022-10-04 DIAGNOSIS — N2581 Secondary hyperparathyroidism of renal origin: Secondary | ICD-10-CM | POA: Diagnosis not present

## 2022-10-04 DIAGNOSIS — Z4932 Encounter for adequacy testing for peritoneal dialysis: Secondary | ICD-10-CM | POA: Diagnosis not present

## 2022-10-04 DIAGNOSIS — Z992 Dependence on renal dialysis: Secondary | ICD-10-CM | POA: Diagnosis not present

## 2022-10-05 DIAGNOSIS — Z992 Dependence on renal dialysis: Secondary | ICD-10-CM | POA: Diagnosis not present

## 2022-10-05 DIAGNOSIS — D631 Anemia in chronic kidney disease: Secondary | ICD-10-CM | POA: Diagnosis not present

## 2022-10-05 DIAGNOSIS — Z4932 Encounter for adequacy testing for peritoneal dialysis: Secondary | ICD-10-CM | POA: Diagnosis not present

## 2022-10-05 DIAGNOSIS — N186 End stage renal disease: Secondary | ICD-10-CM | POA: Diagnosis not present

## 2022-10-05 DIAGNOSIS — N2581 Secondary hyperparathyroidism of renal origin: Secondary | ICD-10-CM | POA: Diagnosis not present

## 2022-10-05 DIAGNOSIS — E1122 Type 2 diabetes mellitus with diabetic chronic kidney disease: Secondary | ICD-10-CM | POA: Diagnosis not present

## 2022-10-05 DIAGNOSIS — E211 Secondary hyperparathyroidism, not elsewhere classified: Secondary | ICD-10-CM | POA: Diagnosis not present

## 2022-10-06 DIAGNOSIS — N2581 Secondary hyperparathyroidism of renal origin: Secondary | ICD-10-CM | POA: Diagnosis not present

## 2022-10-06 DIAGNOSIS — Z4932 Encounter for adequacy testing for peritoneal dialysis: Secondary | ICD-10-CM | POA: Diagnosis not present

## 2022-10-06 DIAGNOSIS — N186 End stage renal disease: Secondary | ICD-10-CM | POA: Diagnosis not present

## 2022-10-06 DIAGNOSIS — D631 Anemia in chronic kidney disease: Secondary | ICD-10-CM | POA: Diagnosis not present

## 2022-10-06 DIAGNOSIS — Z992 Dependence on renal dialysis: Secondary | ICD-10-CM | POA: Diagnosis not present

## 2022-10-07 DIAGNOSIS — Z4932 Encounter for adequacy testing for peritoneal dialysis: Secondary | ICD-10-CM | POA: Diagnosis not present

## 2022-10-07 DIAGNOSIS — N186 End stage renal disease: Secondary | ICD-10-CM | POA: Diagnosis not present

## 2022-10-07 DIAGNOSIS — D631 Anemia in chronic kidney disease: Secondary | ICD-10-CM | POA: Diagnosis not present

## 2022-10-07 DIAGNOSIS — N2581 Secondary hyperparathyroidism of renal origin: Secondary | ICD-10-CM | POA: Diagnosis not present

## 2022-10-07 DIAGNOSIS — Z992 Dependence on renal dialysis: Secondary | ICD-10-CM | POA: Diagnosis not present

## 2022-10-08 DIAGNOSIS — N2581 Secondary hyperparathyroidism of renal origin: Secondary | ICD-10-CM | POA: Diagnosis not present

## 2022-10-08 DIAGNOSIS — N186 End stage renal disease: Secondary | ICD-10-CM | POA: Diagnosis not present

## 2022-10-08 DIAGNOSIS — Z4932 Encounter for adequacy testing for peritoneal dialysis: Secondary | ICD-10-CM | POA: Diagnosis not present

## 2022-10-08 DIAGNOSIS — Z992 Dependence on renal dialysis: Secondary | ICD-10-CM | POA: Diagnosis not present

## 2022-10-08 DIAGNOSIS — D631 Anemia in chronic kidney disease: Secondary | ICD-10-CM | POA: Diagnosis not present

## 2022-10-09 DIAGNOSIS — N2581 Secondary hyperparathyroidism of renal origin: Secondary | ICD-10-CM | POA: Diagnosis not present

## 2022-10-09 DIAGNOSIS — D631 Anemia in chronic kidney disease: Secondary | ICD-10-CM | POA: Diagnosis not present

## 2022-10-09 DIAGNOSIS — Z4932 Encounter for adequacy testing for peritoneal dialysis: Secondary | ICD-10-CM | POA: Diagnosis not present

## 2022-10-09 DIAGNOSIS — N186 End stage renal disease: Secondary | ICD-10-CM | POA: Diagnosis not present

## 2022-10-09 DIAGNOSIS — Z992 Dependence on renal dialysis: Secondary | ICD-10-CM | POA: Diagnosis not present

## 2022-10-10 DIAGNOSIS — D631 Anemia in chronic kidney disease: Secondary | ICD-10-CM | POA: Diagnosis not present

## 2022-10-10 DIAGNOSIS — N2581 Secondary hyperparathyroidism of renal origin: Secondary | ICD-10-CM | POA: Diagnosis not present

## 2022-10-10 DIAGNOSIS — Z4932 Encounter for adequacy testing for peritoneal dialysis: Secondary | ICD-10-CM | POA: Diagnosis not present

## 2022-10-10 DIAGNOSIS — N186 End stage renal disease: Secondary | ICD-10-CM | POA: Diagnosis not present

## 2022-10-10 DIAGNOSIS — Z992 Dependence on renal dialysis: Secondary | ICD-10-CM | POA: Diagnosis not present

## 2022-10-11 DIAGNOSIS — Z4932 Encounter for adequacy testing for peritoneal dialysis: Secondary | ICD-10-CM | POA: Diagnosis not present

## 2022-10-11 DIAGNOSIS — N2581 Secondary hyperparathyroidism of renal origin: Secondary | ICD-10-CM | POA: Diagnosis not present

## 2022-10-11 DIAGNOSIS — H6121 Impacted cerumen, right ear: Secondary | ICD-10-CM | POA: Diagnosis not present

## 2022-10-11 DIAGNOSIS — D631 Anemia in chronic kidney disease: Secondary | ICD-10-CM | POA: Diagnosis not present

## 2022-10-11 DIAGNOSIS — E1122 Type 2 diabetes mellitus with diabetic chronic kidney disease: Secondary | ICD-10-CM | POA: Diagnosis not present

## 2022-10-11 DIAGNOSIS — Z992 Dependence on renal dialysis: Secondary | ICD-10-CM | POA: Diagnosis not present

## 2022-10-11 DIAGNOSIS — N186 End stage renal disease: Secondary | ICD-10-CM | POA: Diagnosis not present

## 2022-10-12 DIAGNOSIS — N186 End stage renal disease: Secondary | ICD-10-CM | POA: Diagnosis not present

## 2022-10-12 DIAGNOSIS — N2581 Secondary hyperparathyroidism of renal origin: Secondary | ICD-10-CM | POA: Diagnosis not present

## 2022-10-12 DIAGNOSIS — Z992 Dependence on renal dialysis: Secondary | ICD-10-CM | POA: Diagnosis not present

## 2022-10-12 DIAGNOSIS — Z4932 Encounter for adequacy testing for peritoneal dialysis: Secondary | ICD-10-CM | POA: Diagnosis not present

## 2022-10-12 DIAGNOSIS — D631 Anemia in chronic kidney disease: Secondary | ICD-10-CM | POA: Diagnosis not present

## 2022-10-13 DIAGNOSIS — N186 End stage renal disease: Secondary | ICD-10-CM | POA: Diagnosis not present

## 2022-10-13 DIAGNOSIS — Z992 Dependence on renal dialysis: Secondary | ICD-10-CM | POA: Diagnosis not present

## 2022-10-13 DIAGNOSIS — Z4932 Encounter for adequacy testing for peritoneal dialysis: Secondary | ICD-10-CM | POA: Diagnosis not present

## 2022-10-13 DIAGNOSIS — N2581 Secondary hyperparathyroidism of renal origin: Secondary | ICD-10-CM | POA: Diagnosis not present

## 2022-10-13 DIAGNOSIS — D631 Anemia in chronic kidney disease: Secondary | ICD-10-CM | POA: Diagnosis not present

## 2022-10-14 DIAGNOSIS — N186 End stage renal disease: Secondary | ICD-10-CM | POA: Diagnosis not present

## 2022-10-14 DIAGNOSIS — D631 Anemia in chronic kidney disease: Secondary | ICD-10-CM | POA: Diagnosis not present

## 2022-10-14 DIAGNOSIS — N2581 Secondary hyperparathyroidism of renal origin: Secondary | ICD-10-CM | POA: Diagnosis not present

## 2022-10-14 DIAGNOSIS — Z4932 Encounter for adequacy testing for peritoneal dialysis: Secondary | ICD-10-CM | POA: Diagnosis not present

## 2022-10-14 DIAGNOSIS — E1122 Type 2 diabetes mellitus with diabetic chronic kidney disease: Secondary | ICD-10-CM | POA: Diagnosis not present

## 2022-10-14 DIAGNOSIS — Z992 Dependence on renal dialysis: Secondary | ICD-10-CM | POA: Diagnosis not present

## 2022-10-15 DIAGNOSIS — N2581 Secondary hyperparathyroidism of renal origin: Secondary | ICD-10-CM | POA: Diagnosis not present

## 2022-10-15 DIAGNOSIS — D631 Anemia in chronic kidney disease: Secondary | ICD-10-CM | POA: Diagnosis not present

## 2022-10-15 DIAGNOSIS — N186 End stage renal disease: Secondary | ICD-10-CM | POA: Diagnosis not present

## 2022-10-15 DIAGNOSIS — Z992 Dependence on renal dialysis: Secondary | ICD-10-CM | POA: Diagnosis not present

## 2022-10-16 DIAGNOSIS — D631 Anemia in chronic kidney disease: Secondary | ICD-10-CM | POA: Diagnosis not present

## 2022-10-16 DIAGNOSIS — N186 End stage renal disease: Secondary | ICD-10-CM | POA: Diagnosis not present

## 2022-10-16 DIAGNOSIS — Z992 Dependence on renal dialysis: Secondary | ICD-10-CM | POA: Diagnosis not present

## 2022-10-16 DIAGNOSIS — N2581 Secondary hyperparathyroidism of renal origin: Secondary | ICD-10-CM | POA: Diagnosis not present

## 2022-10-17 DIAGNOSIS — D631 Anemia in chronic kidney disease: Secondary | ICD-10-CM | POA: Diagnosis not present

## 2022-10-17 DIAGNOSIS — N186 End stage renal disease: Secondary | ICD-10-CM | POA: Diagnosis not present

## 2022-10-17 DIAGNOSIS — Z992 Dependence on renal dialysis: Secondary | ICD-10-CM | POA: Diagnosis not present

## 2022-10-17 DIAGNOSIS — N2581 Secondary hyperparathyroidism of renal origin: Secondary | ICD-10-CM | POA: Diagnosis not present

## 2022-10-18 DIAGNOSIS — N2581 Secondary hyperparathyroidism of renal origin: Secondary | ICD-10-CM | POA: Diagnosis not present

## 2022-10-18 DIAGNOSIS — N186 End stage renal disease: Secondary | ICD-10-CM | POA: Diagnosis not present

## 2022-10-18 DIAGNOSIS — D631 Anemia in chronic kidney disease: Secondary | ICD-10-CM | POA: Diagnosis not present

## 2022-10-18 DIAGNOSIS — Z992 Dependence on renal dialysis: Secondary | ICD-10-CM | POA: Diagnosis not present

## 2022-10-19 DIAGNOSIS — Z992 Dependence on renal dialysis: Secondary | ICD-10-CM | POA: Diagnosis not present

## 2022-10-19 DIAGNOSIS — N186 End stage renal disease: Secondary | ICD-10-CM | POA: Diagnosis not present

## 2022-10-19 DIAGNOSIS — N2581 Secondary hyperparathyroidism of renal origin: Secondary | ICD-10-CM | POA: Diagnosis not present

## 2022-10-19 DIAGNOSIS — D631 Anemia in chronic kidney disease: Secondary | ICD-10-CM | POA: Diagnosis not present

## 2022-10-20 DIAGNOSIS — N2581 Secondary hyperparathyroidism of renal origin: Secondary | ICD-10-CM | POA: Diagnosis not present

## 2022-10-20 DIAGNOSIS — D631 Anemia in chronic kidney disease: Secondary | ICD-10-CM | POA: Diagnosis not present

## 2022-10-20 DIAGNOSIS — Z992 Dependence on renal dialysis: Secondary | ICD-10-CM | POA: Diagnosis not present

## 2022-10-20 DIAGNOSIS — N186 End stage renal disease: Secondary | ICD-10-CM | POA: Diagnosis not present

## 2022-10-21 DIAGNOSIS — D631 Anemia in chronic kidney disease: Secondary | ICD-10-CM | POA: Diagnosis not present

## 2022-10-21 DIAGNOSIS — N2581 Secondary hyperparathyroidism of renal origin: Secondary | ICD-10-CM | POA: Diagnosis not present

## 2022-10-21 DIAGNOSIS — N186 End stage renal disease: Secondary | ICD-10-CM | POA: Diagnosis not present

## 2022-10-21 DIAGNOSIS — I10 Essential (primary) hypertension: Secondary | ICD-10-CM | POA: Diagnosis not present

## 2022-10-21 DIAGNOSIS — G4733 Obstructive sleep apnea (adult) (pediatric): Secondary | ICD-10-CM | POA: Diagnosis not present

## 2022-10-21 DIAGNOSIS — Z992 Dependence on renal dialysis: Secondary | ICD-10-CM | POA: Diagnosis not present

## 2022-10-22 DIAGNOSIS — N2581 Secondary hyperparathyroidism of renal origin: Secondary | ICD-10-CM | POA: Diagnosis not present

## 2022-10-22 DIAGNOSIS — D631 Anemia in chronic kidney disease: Secondary | ICD-10-CM | POA: Diagnosis not present

## 2022-10-22 DIAGNOSIS — Z992 Dependence on renal dialysis: Secondary | ICD-10-CM | POA: Diagnosis not present

## 2022-10-22 DIAGNOSIS — N186 End stage renal disease: Secondary | ICD-10-CM | POA: Diagnosis not present

## 2022-10-23 DIAGNOSIS — N2581 Secondary hyperparathyroidism of renal origin: Secondary | ICD-10-CM | POA: Diagnosis not present

## 2022-10-23 DIAGNOSIS — Z992 Dependence on renal dialysis: Secondary | ICD-10-CM | POA: Diagnosis not present

## 2022-10-23 DIAGNOSIS — N186 End stage renal disease: Secondary | ICD-10-CM | POA: Diagnosis not present

## 2022-10-23 DIAGNOSIS — D631 Anemia in chronic kidney disease: Secondary | ICD-10-CM | POA: Diagnosis not present

## 2022-10-24 ENCOUNTER — Ambulatory Visit (HOSPITAL_COMMUNITY)
Admission: RE | Admit: 2022-10-24 | Discharge: 2022-10-24 | Disposition: A | Discharge status: Home / Self Care | Payer: Medicare Other | Source: Ambulatory Visit | Attending: Internal Medicine | Admitting: Internal Medicine

## 2022-10-24 ENCOUNTER — Ambulatory Visit (HOSPITAL_BASED_OUTPATIENT_CLINIC_OR_DEPARTMENT_OTHER): Payer: Medicare Other

## 2022-10-24 DIAGNOSIS — R0989 Other specified symptoms and signs involving the circulatory and respiratory systems: Secondary | ICD-10-CM | POA: Diagnosis not present

## 2022-10-24 DIAGNOSIS — Z992 Dependence on renal dialysis: Secondary | ICD-10-CM | POA: Diagnosis not present

## 2022-10-24 DIAGNOSIS — R011 Cardiac murmur, unspecified: Secondary | ICD-10-CM | POA: Diagnosis not present

## 2022-10-24 DIAGNOSIS — N2581 Secondary hyperparathyroidism of renal origin: Secondary | ICD-10-CM | POA: Diagnosis not present

## 2022-10-24 DIAGNOSIS — N186 End stage renal disease: Secondary | ICD-10-CM | POA: Diagnosis not present

## 2022-10-24 DIAGNOSIS — D631 Anemia in chronic kidney disease: Secondary | ICD-10-CM | POA: Diagnosis not present

## 2022-10-24 LAB — ECHOCARDIOGRAM COMPLETE
Area-P 1/2: 5.75 cm2
S' Lateral: 4.6 cm

## 2022-10-25 ENCOUNTER — Ambulatory Visit (INDEPENDENT_AMBULATORY_CARE_PROVIDER_SITE_OTHER): Payer: Medicare Other | Admitting: Podiatry

## 2022-10-25 ENCOUNTER — Encounter: Payer: Self-pay | Admitting: Cardiology

## 2022-10-25 ENCOUNTER — Telehealth: Payer: Self-pay

## 2022-10-25 DIAGNOSIS — L989 Disorder of the skin and subcutaneous tissue, unspecified: Secondary | ICD-10-CM | POA: Diagnosis not present

## 2022-10-25 DIAGNOSIS — N186 End stage renal disease: Secondary | ICD-10-CM | POA: Diagnosis not present

## 2022-10-25 DIAGNOSIS — Z992 Dependence on renal dialysis: Secondary | ICD-10-CM | POA: Diagnosis not present

## 2022-10-25 DIAGNOSIS — N2581 Secondary hyperparathyroidism of renal origin: Secondary | ICD-10-CM | POA: Diagnosis not present

## 2022-10-25 DIAGNOSIS — R601 Generalized edema: Secondary | ICD-10-CM

## 2022-10-25 DIAGNOSIS — B351 Tinea unguium: Secondary | ICD-10-CM | POA: Diagnosis not present

## 2022-10-25 DIAGNOSIS — M79675 Pain in left toe(s): Secondary | ICD-10-CM

## 2022-10-25 DIAGNOSIS — I6522 Occlusion and stenosis of left carotid artery: Secondary | ICD-10-CM | POA: Insufficient documentation

## 2022-10-25 DIAGNOSIS — D631 Anemia in chronic kidney disease: Secondary | ICD-10-CM | POA: Diagnosis not present

## 2022-10-25 DIAGNOSIS — M79674 Pain in right toe(s): Secondary | ICD-10-CM

## 2022-10-25 NOTE — H&P (View-Only) (Signed)
+   Office Visit    Patient Name: Adrian Frye Date of Encounter: 10/26/2022  Primary Care Provider:  Emilio Aspen, MD Primary Cardiologist:  None Primary Electrophysiologist: None   Past Medical History    Past Medical History:  Diagnosis Date   Allergic rhinitis    Allergy    Anemia    Asthma    as a child   Bacteremia    Benign essential hypertension    Carotid artery stenosis, asymptomatic, left    1-39% left by dopplers 10/2022   CHF (congestive heart failure) (HCC)    Dilated idiopathic cardiomyopathy (HCC)    now resoved with EF 55% by echo 2011   Edema    Encounter for blood transfusion    Endocarditis    ESRD (end stage renal disease) (HCC)    ESRD on peritoneal dialysis (HCC)    Heart disease    Hyperlipidemia    Hyperparathyroidism, secondary (HCC)    Hypertension    Morbid obesity (HCC)    Sleep apnea    c-pap   UGI bleed 2011   ASA   Past Surgical History:  Procedure Laterality Date   ACHILLES TENDON REPAIR     ruptured; right   AV FISTULA PLACEMENT Left 11/03/2017   Procedure: ARTERIOVENOUS (AV) FISTULA CREATION  left upper arm;  Surgeon: Larina Earthly, MD;  Location: [facility] OR;  Service: Vascular;  Laterality: Left;   COLONOSCOPY     COLONOSCOPY WITH PROPOFOL N/A 08/11/2022   Procedure: COLONOSCOPY WITH PROPOFOL;  Surgeon: Hilarie Fredrickson, MD;  Location: [facility]COPY;  Service: Gastroenterology;  Laterality: N/A;   DIALYSIS FISTULA CREATION     INSERTION OF DIALYSIS CATHETER N/A 11/03/2017   Procedure: INSERTION OF DIALYSIS CATHETER - RIGHT INTERNAL JUGULAR PLACEMENT;  Surgeon: Larina Earthly, MD;  Location: [facility] OR;  Service: Vascular;  Laterality: N/A;   IR FLUORO GUIDE CV LINE LEFT  01/24/2020   IR REMOVAL TUN CV CATH W/O FL  01/21/2020   IR REMOVAL TUN CV CATH W/O FL  03/06/2020   IR US GUIDE VASC ACCESS LEFT  01/24/2020   KIDNEY FAILURE     LAPAROSCOPIC GASTROTOMY W/ REPAIR OF ULCER     PLEURAL SCARIFICATION     left, football  trauma-chest tube   STOMACH SURGERY     TEE WITHOUT CARDIOVERSION N/A 01/22/2020   Procedure: TRANSESOPHAGEAL ECHOCARDIOGRAM (TEE);  Surgeon: Chrystie Nose, MD;  Location: [facility] ENDOSCOPY;  Service: Cardiovascular;  Laterality: N/A;   TONSILLECTOMY     TONSILLECTOMY     UPPER GASTROINTESTINAL ENDOSCOPY      Allergies  Allergies  Allergen Reactions   Ace Inhibitors Anaphylaxis and Swelling   Lisinopril Swelling and Anaphylaxis    Angioedema  Other reaction(s): Angioedema (ALLERGY/intolerance)   Losartan Swelling and Anaphylaxis     History of Present Illness    Adrian Frye  is a 52 year old male with a PMH of chronic HFpEF, DCM, obesity, HTN, ESRD, PV endocarditis, OSA (on CPAP) who presents today for 1 month follow-up.  Adrian Frye was seen initially by Adrian Frye in 04/2013 for management of sleep apnea and hypertension he was diagnosed with ESRD and underwent AV fistula placement in 10/2017.  He completed peritoneal dialysis treatment and developed a staph bacteremia in his blood in 01/2020.  He underwent a TEE that revealed a large mobile vegetation on the pulmonic valve with no evidence of PFO.  He was discharged with central line and  6 weeks of IV antibiotics.  He established care with [facility] in 02/2020 but was referred back to Adrian Frye for sleep medicine management.  He reported complaint of chest pain and underwent Lexiscan Myoview that showed no ischemia and was low risk.  EF however was noted to be slightly reduced.  He completed a TTE that showed normal EF of 55-60% with mildly dilated LA and trivial MVR and mild LVH with no evidence of valvular vegetation.  He was last seen by Adrian Frye on 09/21/2022 and reported doing well but heart murmur was noted and underwent 2D echo along with left carotid Doppler for suspected bruit.  2D echo showed EF of 50-55% with grade 1 DD and moderate concentric LVH with moderate to severe pulmonary valve  regurgitation.  Adrian Frye presents today to review TTE results alone.  Since last being seen in the office patient reports that he has been doing well with no new cardiac complaints.  His blood pressure today is controlled at 132/79 and heart rate is 84 bpm.  He is compliant with his current medication regimen and denies any adverse reactions.  He reports compliance with his Pap machine nightly.  During our visit we reviewed the results of his TTE and discussed the need for further testing and evaluation based on results.  He had all questions answered to his satisfaction.  Patient denies chest pain, palpitations, dyspnea, PND, orthopnea, nausea, vomiting, dizziness, syncope, edema, weight gain, or early satiety.   Home Medications    Current Outpatient Medications  Medication Sig Dispense Refill   albuterol (VENTOLIN HFA) 108 (90 Base) MCG/ACT inhaler TAKE 2 PUFFS BY MOUTH EVERY 6 HOURS AS NEEDED FOR WHEEZE OR SHORTNESS OF BREATH 8.5 each 1   amLODipine (NORVASC) 2.5 MG tablet Take 1 tablet (2.5 mg total) by mouth daily. 90 tablet 3   AURYXIA 1 GM 210 MG(Fe) tablet Take 630 mg by mouth 3 (three) times daily.     B Complex-C-Folic Acid (DIALYVITE 800 PO) Take 1 tablet by mouth daily.     calcitRIOL (ROCALTROL) 0.5 MCG capsule Take 0.5 mcg by mouth daily.     gentamicin cream (GARAMYCIN) 0.1 % Apply 1 Application topically daily as needed (Catheter).     Methoxy PEG-Epoetin Beta (MIRCERA IJ) Inject into the skin. As needed     sildenafil (REVATIO) 20 MG tablet Take 1 tablet (20 mg total) by mouth 3 (three) times daily. 20 tablet 1   traZODone (DESYREL) 50 MG tablet Take 100 mg by mouth at bedtime as needed for sleep.     No current facility-administered medications for this visit.     Review of Systems  Please see the history of present illness.    (+) Fatigue and shortness of breath  All other systems reviewed and are otherwise negative except as noted above.  Physical Exam    Wt  Readings from Last 3 Encounters:  10/26/22 279 lb 12.8 oz (126.9 kg)  09/21/22 274 lb 12.8 oz (124.6 kg)  08/11/22 268 lb 1.3 oz (121.6 kg)   VS: Vitals:   10/26/22 1335  BP: 132/79  Pulse: 84  SpO2: 95%  ,Body mass index is 43.82 kg/m.  Constitutional:      Appearance: Healthy appearance. Not in distress.  Neck:     Vascular: JVD normal.  Pulmonary:     Effort: Pulmonary effort is normal.     Breath sounds: No wheezing. No rales. Diminished in the bases Cardiovascular:  Normal rate. Regular rhythm. Normal S1. Normal S2.      Murmurs: There is no murmur.  Edema:    Peripheral edema absent.  Abdominal:     Palpations: Abdomen is soft non tender. There is no hepatomegaly.  Skin:    General: Skin is warm and dry.  Neurological:     General: No focal deficit present.     Mental Status: Alert and oriented to person, place and time.     Cranial Nerves: Cranial nerves are intact.  EKG/LABS/ Recent Cardiac Studies    ECG personally reviewed by me today -sinus rhythm with poor R wave progression and right axis deviation with left bundle branch block and rate of 84 bpm.  Cardiac Studies & Procedures     STRESS TESTS  MYOCARDIAL PERFUSION IMAGING 07/14/2020  Narrative  Nuclear stress EF: 47%.  The left ventricular ejection fraction is mildly decreased (45-54%).  No T wave inversion was noted during stress.  There was no ST segment deviation noted during stress.  This is an intermediate risk study.  Normal perfusion. Moderately dilated LV with global hypokinesis, LVEF 47%. This is an intermediate risk study. No change in perfusion compared to a prior study in 2015.   ECHOCARDIOGRAM  ECHOCARDIOGRAM COMPLETE 10/24/2022  Narrative ECHOCARDIOGRAM REPORT    Patient Name:   AZAZEL EZERNACK Date of Exam: 10/24/2022 Medical Rec #:  [id number]             Height:       67.0 in Accession #:    [id number]            Weight:       274.8 lb Date of Birth:  November 24, 1970              BSA:          2.315 m Patient Age:    52 years              BP:           114/83 mmHg Patient Gender: M                     HR:           76 bpm. Exam Location:  [address]  Procedure: 2D Echo, Cardiac Doppler, Color Doppler and 3D Echo  Indications:    Murmur R01.1  History:        Patient has prior history of Echocardiogram examinations. CHF, Endocarditis; Risk Factors:Hypertension and Dyslipidemia.  Sonographer:    Thurman Coyer RDCS Referring Phys: [id number] TRACI R TURNER  IMPRESSIONS   1. Left ventricular ejection fraction, by estimation, is 50 to 55%. The left ventricle has low normal function. Paradoxical septal bounce. There is moderate concentric left ventricular hypertrophy. Left ventricular diastolic parameters are consistent with Grade I diastolic dysfunction (impaired relaxation). 2. Right ventricular systolic function is normal. The right ventricular size is normal. 3. The mitral valve is normal in structure. No evidence of mitral valve regurgitation. No evidence of mitral stenosis. 4. The aortic valve is normal in structure. Aortic valve regurgitation is not visualized. No aortic stenosis is present. 5. The pulmonic valve is not well visualized; there is significant color aliasing on evaluation of the valve, however, there appears to be moderate-severe pumonary valve regurgitation. Consider further imaging for better evaluation. 6. There is borderline dilatation of the ascending aorta, measuring 39 mm. 7. The inferior vena cava is normal in size with greater than 50% respiratory  variability, suggesting right atrial pressure of 3 mmHg.  FINDINGS Left Ventricle: Left ventricular ejection fraction, by estimation, is 50 to 55%. The left ventricle has low normal function. The left ventricle has no regional wall motion abnormalities. The left ventricular internal cavity size was normal in size. There is moderate concentric left ventricular hypertrophy. Left  ventricular diastolic parameters are consistent with Grade I diastolic dysfunction (impaired relaxation).  Right Ventricle: The right ventricular size is normal. No increase in right ventricular wall thickness. Right ventricular systolic function is normal.  Left Atrium: Left atrial size was normal in size.  Right Atrium: Right atrial size was normal in size.  Pericardium: There is no evidence of pericardial effusion.  Mitral Valve: The mitral valve is normal in structure. No evidence of mitral valve regurgitation. No evidence of mitral valve stenosis.  Tricuspid Valve: The tricuspid valve is normal in structure. Tricuspid valve regurgitation is not demonstrated. No evidence of tricuspid stenosis.  Aortic Valve: The aortic valve is normal in structure. Aortic valve regurgitation is not visualized. No aortic stenosis is present.  Pulmonic Valve: The pulmonic valve was not well visualized. Pulmonic valve regurgitation is moderate to severe. No evidence of pulmonic stenosis.  Aorta: The aortic root is normal in size and structure. There is borderline dilatation of the ascending aorta, measuring 39 mm.  Venous: The inferior vena cava is normal in size with greater than 50% respiratory variability, suggesting right atrial pressure of 3 mmHg.  IAS/Shunts: No atrial level shunt detected by color flow Doppler.   LEFT VENTRICLE PLAX 2D LVIDd:         5.60 cm   Diastology LVIDs:         4.60 cm   LV e' medial:    5.87 cm/s LV PW:         1.40 cm   LV E/e' medial:  16.3 LV IVS:        1.40 cm   LV e' lateral:   6.31 cm/s LVOT diam:     2.50 cm   LV E/e' lateral: 15.2 LV SV:         120 LV SV Index:   52 LVOT Area:     4.91 cm  3D Volume EF: 3D EF:        42 % LV EDV:       224 ml LV ESV:       131 ml LV SV:        93 ml  RIGHT VENTRICLE RV Basal diam:  4.60 cm RV Mid diam:    3.70 cm RV S prime:     13.30 cm/s TAPSE (M-mode): 2.8 cm  LEFT ATRIUM              Index        RIGHT  ATRIUM           Index LA diam:        4.80 cm  2.07 cm/m   RA Area:     22.30 cm LA Vol (A2C):   136.5 ml 58.97 ml/m  RA Volume:   64.30 ml  27.78 ml/m LA Vol (A4C):   83.1 ml  35.90 ml/m LA Biplane Vol: 107.0 ml 46.23 ml/m AORTIC VALVE LVOT Vmax:   121.00 cm/s LVOT Vmean:  80.900 cm/s LVOT VTI:    0.245 m  AORTA Ao Root diam: 3.60 cm Ao Asc diam:  3.90 cm  MITRAL VALVE MV Area (PHT): 5.75 cm  SHUNTS MV Decel Time: 132 msec     Systemic VTI:  0.24 m MV E velocity: 95.80 cm/s   Systemic Diam: 2.50 cm MV A velocity: 112.00 cm/s MV E/A ratio:  0.86  Aditya Sabharwal Electronically signed by Dorthula Nettles Signature Date/Time: 10/24/2022/1:56:22 PM    Final   TEE  ECHO TEE 01/22/2020  Narrative TRANSESOPHOGEAL ECHO REPORT    Patient Name:   CINQUE RIAL Date of Exam: 01/22/2020 Medical Rec #:  [id number]             Height:       67.0 in Accession #:    [id number]            Weight:       286.6 lb Date of Birth:  August 09, 1970             BSA:          2.356 m Patient Age:    49 years              BP:           162/89 mmHg Patient Gender: M                     HR:           95 bpm. Exam Location:  Inpatient  Procedure: Transesophageal Echo, Color Doppler and Cardiac Doppler  Indications:     Bacteremia 790.7 / R78.81  History:         Patient has prior history of Echocardiogram examinations. Risk Factors:Hypertension and Sleep Apnea. End stage renal diseae, Anemia, thrombocytopenia.  Sonographer:     Leta Jungling RDCS Referring Phys:  [id number] Tacey Ruiz DUNN Diagnosing Phys: Zoila Shutter MD   Sonographer Comments: Technically difficult study due to poor echo windows. Image acquisition challenging due to patient body habitus.   PROCEDURE: The transesophogeal probe was passed without difficulty through the esophogus of the patient. Sedation performed by different physician. The patient was monitored while under deep sedation. Anesthestetic sedation  was provided intravenously by Anesthesiology: 217mg  of Propofol. The patient developed no complications during the procedure.  IMPRESSIONS   1. Left ventricular ejection fraction, by estimation, is 50 to 55%. The left ventricle has low normal function. There is mild left ventricular hypertrophy. 2. Right ventricular systolic function is normal. The right ventricular size is normal. 3. No left atrial/left atrial appendage thrombus was detected. 4. The mitral valve is grossly normal. Trivial mitral valve regurgitation. 5. The aortic valve is tricuspid. Aortic valve regurgitation is not visualized. 6. 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 The pulmonic valve was abnormal. Pulmonic valve regurgitation is moderate.  Conclusion(s)/Recommendation(s): Findings are concerning for vegetation/infective endocarditis as detailed above.  FINDINGS Left Ventricle: Left ventricular ejection fraction, by estimation, is 50 to 55%. The left ventricle has low normal function. The left ventricular internal cavity size was normal in size. There is mild left ventricular hypertrophy.  Right Ventricle: The right ventricular size is normal. No increase in right ventricular wall thickness. Right ventricular systolic function is normal.  Left Atrium: Left atrial size was normal in size. No left atrial/left atrial appendage thrombus was detected.  Right Atrium: Right atrial size was normal in size.  Pericardium: There is no evidence of pericardial effusion.  Mitral Valve: The mitral valve is grossly normal. Trivial mitral valve regurgitation.  Tricuspid Valve: The tricuspid valve is grossly normal. Tricuspid valve regurgitation is trivial.  Aortic Valve: The  aortic valve is tricuspid. Aortic valve regurgitation is not visualized.  Pulmonic Valve: Large mobile 1 x 1.5 cm mass consistent with vegetation. The pulmonic valve was abnormal. Pulmonic valve regurgitation is moderate.  Aorta: The aortic  root and ascending aorta are structurally normal, with no evidence of dilitation.  IAS/Shunts: No atrial level shunt detected by color flow Doppler.  Zoila Shutter MD Electronically signed by Zoila Shutter MD Signature Date/Time: 01/22/2020/3:16:54 PM    Final            Risk Assessment/Calculations:            Lab Results  Component Value Date   WBC 14.3 (H) 01/24/2020   HGB 8.8 (L) 08/11/2022   HCT 26.0 (L) 08/11/2022   MCV 89.5 01/24/2020   PLT 124 (L) 01/24/2020   Lab Results  Component Value Date   CREATININE >18.00 (H) 08/11/2022   BUN 67 (H) 08/11/2022   NA 138 08/11/2022   K 3.4 (L) 08/11/2022   CL 96 (L) 08/11/2022   CO2 20 (L) 01/24/2020   Lab Results  Component Value Date   ALT 42 01/21/2020   AST 24 01/21/2020   ALKPHOS 95 01/21/2020   BILITOT 0.5 01/21/2020   No results found for: "CHOL", "HDL", "LDLCALC", "LDLDIRECT", "TRIG", "CHOLHDL"  Lab Results  Component Value Date   HGBA1C 4.7 (L) 11/02/2017     Assessment & Plan    1.  Pulmonic valve regurgitation: -Recent 2D echo with EF of 50-55% and moderate concentric LVH with grade 1 DD and moderate to severe pulmonary valve regurgitation -Patient will need TEE for further evaluation. -We will order BMET and CBC today  2.  HFpEF: -Most recent 2D echo with EF of 50-55% -Today patient is euvolemic on examination.   3.  Essential hypertension: -Patient's blood pressure today was controlled at 132/79  4.  DM type II: -Patient's last hemoglobin A1c was improved at 5.7  5.  Obstructive sleep apnea: -Today patient reports compliance with PAP      Disposition: Follow-up with None or as scheduled Informed Consent   Shared Decision Making/Informed Consent The risks [esophageal damage, perforation (1:10,000 risk), bleeding, pharyngeal hematoma as well as other potential complications associated with conscious sedation including aspiration, arrhythmia, respiratory failure and death], benefits  (treatment guidance and diagnostic support) and alternatives of a transesophageal echocardiogram were discussed in detail with Mr. Darty and he is willing to proceed.       Medication Adjustments/Labs and Tests Ordered: Current medicines are reviewed at length with the patient today.  Concerns regarding medicines are outlined above.   Signed, Napoleon Form, Leodis Rains, NP 10/26/2022, 3:36 PM [facility]

## 2022-10-25 NOTE — Patient Instructions (Signed)
Shoes to try  Gravity Defyer [facility]

## 2022-10-25 NOTE — Telephone Encounter (Signed)
Patient aware of echo results and will call to schedule appointment. When patient calls back please schedule him and appointment.

## 2022-10-25 NOTE — Telephone Encounter (Signed)
-----   Message from Quintella Reichert, MD sent at 10/24/2022  2:34 PM EDT ----- 2D echo showed low normal heart function with moderately thickened heart muscle, increased stiffness of heart muscle called diastolic dysfunction, and moderate to severe leakiness of the PV.  Please get him in with extender for preop for TEE to assess PV further

## 2022-10-25 NOTE — Progress Notes (Unsigned)
+   Office Visit    Patient Name: Adrian Frye Date of Encounter: 10/26/2022  Primary Care Provider:  Henderson, Jonathan A, MD Primary Cardiologist:  None Primary Electrophysiologist: None   Past Medical History    Past Medical History:  Diagnosis Date   Allergic rhinitis    Allergy    Anemia    Asthma    as Frye child   Bacteremia    Benign essential hypertension    Carotid artery stenosis, asymptomatic, left    1-39% left by dopplers 10/2022   CHF (congestive heart failure) (HCC)    Dilated idiopathic cardiomyopathy (HCC)    now resoved with EF 55% by echo 2011   Edema    Encounter for blood transfusion    Endocarditis    ESRD (end stage renal disease) (HCC)    ESRD on peritoneal dialysis (HCC)    Heart disease    Hyperlipidemia    Hyperparathyroidism, secondary (HCC)    Hypertension    Morbid obesity (HCC)    Sleep apnea    Frye-pap   UGI bleed 2011   ASA   Past Surgical History:  Procedure Laterality Date   ACHILLES TENDON REPAIR     ruptured; right   AV FISTULA PLACEMENT Left 11/03/2017   Procedure: ARTERIOVENOUS (AV) FISTULA CREATION  left upper arm;  Surgeon: Early, Todd F, MD;  Location: [facility] OR;  Service: Vascular;  Laterality: Left;   COLONOSCOPY     COLONOSCOPY WITH PROPOFOL Frye/Frye 08/11/2022   Procedure: COLONOSCOPY WITH PROPOFOL;  Surgeon: Perry, John N, MD;  Location: [facility];  Service: Gastroenterology;  Laterality: Frye/Frye;   DIALYSIS FISTULA CREATION     INSERTION OF DIALYSIS CATHETER Frye/Frye 11/03/2017   Procedure: INSERTION OF DIALYSIS CATHETER - RIGHT INTERNAL JUGULAR PLACEMENT;  Surgeon: Early, Todd F, MD;  Location: [facility] OR;  Service: Vascular;  Laterality: Frye/Frye;   IR FLUORO GUIDE CV LINE LEFT  01/24/2020   IR REMOVAL TUN CV CATH W/O FL  01/21/2020   IR REMOVAL TUN CV CATH W/O FL  03/06/2020   IR US GUIDE VASC ACCESS LEFT  01/24/2020   KIDNEY FAILURE     LAPAROSCOPIC GASTROTOMY W/ REPAIR OF ULCER     PLEURAL SCARIFICATION     left, football  trauma-chest tube   STOMACH SURGERY     TEE WITHOUT CARDIOVERSION Frye/Frye 01/22/2020   Procedure: TRANSESOPHAGEAL ECHOCARDIOGRAM (TEE);  Surgeon: Hilty, Kenneth C, MD;  Location: [facility];  Service: Cardiovascular;  Laterality: Frye/Frye;   TONSILLECTOMY     TONSILLECTOMY     UPPER GASTROINTESTINAL ENDOSCOPY      Allergies  Allergies  Allergen Reactions   Ace Inhibitors Anaphylaxis and Swelling   Lisinopril Swelling and Anaphylaxis    Angioedema  Other reaction(s): Angioedema (ALLERGY/intolerance)   Losartan Swelling and Anaphylaxis     History of Present Illness    Adrian Frye  is Frye 52 year old male with Frye PMH of chronic HFpEF, DCM, obesity, HTN, ESRD, PV endocarditis, OSA (on CPAP) who presents today for 1 month follow-up.  Adrian Frye was seen initially by Adrian Frye in 04/2013 for management of sleep apnea and hypertension he was diagnosed with ESRD and underwent AV fistula placement in 10/2017.  He completed peritoneal dialysis treatment and developed Frye staph bacteremia in his blood in 01/2020.  He underwent Frye TEE that revealed Frye large mobile vegetation on the pulmonic valve with no evidence of PFO.  He was discharged with central line and   6 weeks of IV antibiotics.  He established care with [facility] in 02/2020 but was referred back to Adrian Frye for sleep medicine management.  He reported complaint of chest pain and underwent Lexiscan Myoview that showed no ischemia and was low risk.  EF however was noted to be slightly reduced.  He completed Frye TTE that showed normal EF of 55-60% with mildly dilated LA and trivial MVR and mild LVH with no evidence of valvular vegetation.  He was last seen by Adrian Frye on 09/21/2022 and reported doing well but heart murmur was noted and underwent 2D echo along with left carotid Doppler for suspected bruit.  2D echo showed EF of 50-55% with grade 1 DD and moderate concentric LVH with moderate to severe pulmonary valve  regurgitation.  Adrian Frye presents today to review TTE results alone.  Since last being seen in the office patient reports that he has been doing well with no new cardiac complaints.  His blood pressure today is controlled at 132/79 and heart rate is 84 bpm.  He is compliant with his current medication regimen and denies any adverse reactions.  He reports compliance with his Pap machine nightly.  During our visit we reviewed the results of his TTE and discussed the need for further testing and evaluation based on results.  He had all questions answered to his satisfaction.  Patient denies chest pain, palpitations, dyspnea, PND, orthopnea, nausea, vomiting, dizziness, syncope, edema, weight gain, or early satiety.   Home Medications    Current Outpatient Medications  Medication Sig Dispense Refill   albuterol (VENTOLIN HFA) 108 (90 Base) MCG/ACT inhaler TAKE 2 PUFFS BY MOUTH EVERY 6 HOURS AS NEEDED FOR WHEEZE OR SHORTNESS OF BREATH 8.5 each 1   amLODipine (NORVASC) 2.5 MG tablet Take 1 tablet (2.5 mg total) by mouth daily. 90 tablet 3   AURYXIA 1 GM 210 MG(Fe) tablet Take 630 mg by mouth 3 (three) times daily.     B Complex-Frye-Folic Acid (DIALYVITE 800 PO) Take 1 tablet by mouth daily.     calcitRIOL (ROCALTROL) 0.5 MCG capsule Take 0.5 mcg by mouth daily.     gentamicin cream (GARAMYCIN) 0.1 % Apply 1 Application topically daily as needed (Catheter).     Methoxy PEG-Epoetin Beta (MIRCERA IJ) Inject into the skin. As needed     sildenafil (REVATIO) 20 MG tablet Take 1 tablet (20 mg total) by mouth 3 (three) times daily. 20 tablet 1   traZODone (DESYREL) 50 MG tablet Take 100 mg by mouth at bedtime as needed for sleep.     No current facility-administered medications for this visit.     Review of Systems  Please see the history of present illness.    (+) Fatigue and shortness of breath  All other systems reviewed and are otherwise negative except as noted above.  Physical Exam    Wt  Readings from Last 3 Encounters:  10/26/22 279 lb 12.8 oz (126.9 kg)  09/21/22 274 lb 12.8 oz (124.6 kg)  08/11/22 268 lb 1.3 oz (121.6 kg)   VS: Vitals:   10/26/22 1335  BP: 132/79  Pulse: 84  SpO2: 95%  ,Body mass index is 43.82 kg/m.  Constitutional:      Appearance: Healthy appearance. Not in distress.  Neck:     Vascular: JVD normal.  Pulmonary:     Effort: Pulmonary effort is normal.     Breath sounds: No wheezing. No rales. Diminished in the bases Cardiovascular:       Normal rate. Regular rhythm. Normal S1. Normal S2.      Murmurs: There is no murmur.  Edema:    Peripheral edema absent.  Abdominal:     Palpations: Abdomen is soft non tender. There is no hepatomegaly.  Skin:    General: Skin is warm and dry.  Neurological:     General: No focal deficit present.     Mental Status: Alert and oriented to person, place and time.     Cranial Nerves: Cranial nerves are intact.  EKG/LABS/ Recent Cardiac Studies    ECG personally reviewed by me today -sinus rhythm with poor R wave progression and right axis deviation with left bundle branch block and rate of 84 bpm.  Cardiac Studies & Procedures     STRESS TESTS  MYOCARDIAL PERFUSION IMAGING 07/14/2020  Narrative  Nuclear stress EF: 47%.  The left ventricular ejection fraction is mildly decreased (45-54%).  No T wave inversion was noted during stress.  There was no ST segment deviation noted during stress.  This is an intermediate risk study.  Normal perfusion. Moderately dilated LV with global hypokinesis, LVEF 47%. This is an intermediate risk study. No change in perfusion compared to Frye prior study in 2015.   ECHOCARDIOGRAM  ECHOCARDIOGRAM COMPLETE 10/24/2022  Narrative ECHOCARDIOGRAM REPORT    Patient Name:   Rilyn THOMAS Weins Date of Exam: 10/24/2022 Medical Rec #:  [id number]             Height:       67.0 in Accession #:    [id number]            Weight:       274.8 lb Date of Birth:  12/03/1970              BSA:          2.315 m Patient Age:    52 years              BP:           114/83 mmHg Patient Gender: M                     HR:           76 bpm. Exam Location:  [address]  Procedure: 2D Echo, Cardiac Doppler, Color Doppler and 3D Echo  Indications:    Murmur R01.1  History:        Patient has prior history of Echocardiogram examinations. CHF, Endocarditis; Risk Factors:Hypertension and Dyslipidemia.  Sonographer:    Casey Kirkpatrick RDCS Referring Phys: [id number] TRACI R Frye  IMPRESSIONS   1. Left ventricular ejection fraction, by estimation, is 50 to 55%. The left ventricle has low normal function. Paradoxical septal bounce. There is moderate concentric left ventricular hypertrophy. Left ventricular diastolic parameters are consistent with Grade I diastolic dysfunction (impaired relaxation). 2. Right ventricular systolic function is normal. The right ventricular size is normal. 3. The mitral valve is normal in structure. No evidence of mitral valve regurgitation. No evidence of mitral stenosis. 4. The aortic valve is normal in structure. Aortic valve regurgitation is not visualized. No aortic stenosis is present. 5. The pulmonic valve is not well visualized; there is significant color aliasing on evaluation of the valve, however, there appears to be moderate-severe pumonary valve regurgitation. Consider further imaging for better evaluation. 6. There is borderline dilatation of the ascending aorta, measuring 39 mm. 7. The inferior vena cava is normal in size with greater than 50% respiratory   variability, suggesting right atrial pressure of 3 mmHg.  FINDINGS Left Ventricle: Left ventricular ejection fraction, by estimation, is 50 to 55%. The left ventricle has low normal function. The left ventricle has no regional wall motion abnormalities. The left ventricular internal cavity size was normal in size. There is moderate concentric left ventricular hypertrophy. Left  ventricular diastolic parameters are consistent with Grade I diastolic dysfunction (impaired relaxation).  Right Ventricle: The right ventricular size is normal. No increase in right ventricular wall thickness. Right ventricular systolic function is normal.  Left Atrium: Left atrial size was normal in size.  Right Atrium: Right atrial size was normal in size.  Pericardium: There is no evidence of pericardial effusion.  Mitral Valve: The mitral valve is normal in structure. No evidence of mitral valve regurgitation. No evidence of mitral valve stenosis.  Tricuspid Valve: The tricuspid valve is normal in structure. Tricuspid valve regurgitation is not demonstrated. No evidence of tricuspid stenosis.  Aortic Valve: The aortic valve is normal in structure. Aortic valve regurgitation is not visualized. No aortic stenosis is present.  Pulmonic Valve: The pulmonic valve was not well visualized. Pulmonic valve regurgitation is moderate to severe. No evidence of pulmonic stenosis.  Aorta: The aortic root is normal in size and structure. There is borderline dilatation of the ascending aorta, measuring 39 mm.  Venous: The inferior vena cava is normal in size with greater than 50% respiratory variability, suggesting right atrial pressure of 3 mmHg.  IAS/Shunts: No atrial level shunt detected by color flow Doppler.   LEFT VENTRICLE PLAX 2D LVIDd:         5.60 cm   Diastology LVIDs:         4.60 cm   LV e' medial:    5.87 cm/s LV PW:         1.40 cm   LV E/e' medial:  16.3 LV IVS:        1.40 cm   LV e' lateral:   6.31 cm/s LVOT diam:     2.50 cm   LV E/e' lateral: 15.2 LV SV:         120 LV SV Index:   52 LVOT Area:     4.91 cm  3D Volume EF: 3D EF:        42 % LV EDV:       224 ml LV ESV:       131 ml LV SV:        93 ml  RIGHT VENTRICLE RV Basal diam:  4.60 cm RV Mid diam:    3.70 cm RV S prime:     13.30 cm/s TAPSE (M-mode): 2.8 cm  LEFT ATRIUM              Index        RIGHT  ATRIUM           Index LA diam:        4.80 cm  2.07 cm/m   RA Area:     22.30 cm LA Vol (A2C):   136.5 ml 58.97 ml/m  RA Volume:   64.30 ml  27.78 ml/m LA Vol (A4C):   83.1 ml  35.90 ml/m LA Biplane Vol: 107.0 ml 46.23 ml/m AORTIC VALVE LVOT Vmax:   121.00 cm/s LVOT Vmean:  80.900 cm/s LVOT VTI:    0.245 m  AORTA Ao Root diam: 3.60 cm Ao Asc diam:  3.90 cm  MITRAL VALVE MV Area (PHT): 5.75 cm       SHUNTS MV Decel Time: 132 msec     Systemic VTI:  0.24 m MV E velocity: 95.80 cm/s   Systemic Diam: 2.50 cm MV Frye velocity: 112.00 cm/s MV E/Frye ratio:  0.86  Aditya Sabharwal Electronically signed by Aditya Sabharwal Signature Date/Time: 10/24/2022/1:56:22 PM    Final   TEE  ECHO TEE 01/22/2020  Narrative TRANSESOPHOGEAL ECHO REPORT    Patient Name:   Kohan THOMAS Bohorquez Date of Exam: 01/22/2020 Medical Rec #:  [id number]             Height:       67.0 in Accession #:    [id number]            Weight:       286.6 lb Date of Birth:  04/28/1971             BSA:          2.356 m Patient Age:    49 years              BP:           162/89 mmHg Patient Gender: M                     HR:           95 bpm. Exam Location:  Inpatient  Procedure: Transesophageal Echo, Color Doppler and Cardiac Doppler  Indications:     Bacteremia 790.7 / R78.81  History:         Patient has prior history of Echocardiogram examinations. Risk Factors:Hypertension and Sleep Apnea. End stage renal diseae, Anemia, thrombocytopenia.  Sonographer:     Tiffany Cooper RDCS Referring Phys:  [id number] DAYNA Frye DUNN Diagnosing Phys: Kenneth Hilty MD   Sonographer Comments: Technically difficult study due to poor echo windows. Image acquisition challenging due to patient body habitus.   PROCEDURE: The transesophogeal probe was passed without difficulty through the esophogus of the patient. Sedation performed by different physician. The patient was monitored while under deep sedation. Anesthestetic sedation  was provided intravenously by Anesthesiology: 217mg of Propofol. The patient developed no complications during the procedure.  IMPRESSIONS   1. Left ventricular ejection fraction, by estimation, is 50 to 55%. The left ventricle has low normal function. There is mild left ventricular hypertrophy. 2. Right ventricular systolic function is normal. The right ventricular size is normal. 3. No left atrial/left atrial appendage thrombus was detected. 4. The mitral valve is grossly normal. Trivial mitral valve regurgitation. 5. The aortic valve is tricuspid. Aortic valve regurgitation is not visualized. 6. 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 The pulmonic valve was abnormal. Pulmonic valve regurgitation is moderate.  Conclusion(s)/Recommendation(s): Findings are concerning for vegetation/infective endocarditis as detailed above.  FINDINGS Left Ventricle: Left ventricular ejection fraction, by estimation, is 50 to 55%. The left ventricle has low normal function. The left ventricular internal cavity size was normal in size. There is mild left ventricular hypertrophy.  Right Ventricle: The right ventricular size is normal. No increase in right ventricular wall thickness. Right ventricular systolic function is normal.  Left Atrium: Left atrial size was normal in size. No left atrial/left atrial appendage thrombus was detected.  Right Atrium: Right atrial size was normal in size.  Pericardium: There is no evidence of pericardial effusion.  Mitral Valve: The mitral valve is grossly normal. Trivial mitral valve regurgitation.  Tricuspid Valve: The tricuspid valve is grossly normal. Tricuspid valve regurgitation is trivial.  Aortic Valve: The   aortic valve is tricuspid. Aortic valve regurgitation is not visualized.  Pulmonic Valve: Large mobile 1 x 1.5 cm mass consistent with vegetation. The pulmonic valve was abnormal. Pulmonic valve regurgitation is moderate.  Aorta: The aortic  root and ascending aorta are structurally normal, with no evidence of dilitation.  IAS/Shunts: No atrial level shunt detected by color flow Doppler.  Kenneth Hilty MD Electronically signed by Kenneth Hilty MD Signature Date/Time: 01/22/2020/3:16:54 PM    Final            Risk Assessment/Calculations:            Lab Results  Component Value Date   WBC 14.3 (H) 01/24/2020   HGB 8.8 (L) 08/11/2022   HCT 26.0 (L) 08/11/2022   MCV 89.5 01/24/2020   PLT 124 (L) 01/24/2020   Lab Results  Component Value Date   CREATININE >18.00 (H) 08/11/2022   BUN 67 (H) 08/11/2022   NA 138 08/11/2022   K 3.4 (L) 08/11/2022   CL 96 (L) 08/11/2022   CO2 20 (L) 01/24/2020   Lab Results  Component Value Date   ALT 42 01/21/2020   AST 24 01/21/2020   ALKPHOS 95 01/21/2020   BILITOT 0.5 01/21/2020   No results found for: "CHOL", "HDL", "LDLCALC", "LDLDIRECT", "TRIG", "CHOLHDL"  Lab Results  Component Value Date   HGBA1C 4.7 (L) 11/02/2017     Assessment & Plan    1.  Pulmonic valve regurgitation: -Recent 2D echo with EF of 50-55% and moderate concentric LVH with grade 1 DD and moderate to severe pulmonary valve regurgitation -Patient will need TEE for further evaluation. -We will order BMET and CBC today  2.  HFpEF: -Most recent 2D echo with EF of 50-55% -Today patient is euvolemic on examination.   3.  Essential hypertension: -Patient's blood pressure today was controlled at 132/79  4.  DM type II: -Patient's last hemoglobin A1c was improved at 5.7  5.  Obstructive sleep apnea: -Today patient reports compliance with PAP      Disposition: Follow-up with None or as scheduled Informed Consent   Shared Decision Making/Informed Consent The risks [esophageal damage, perforation (1:10,000 risk), bleeding, pharyngeal hematoma as well as other potential complications associated with conscious sedation including aspiration, arrhythmia, respiratory failure and death], benefits  (treatment guidance and diagnostic support) and alternatives of Frye transesophageal echocardiogram were discussed in detail with Mr. Steadham and he is willing to proceed.       Medication Adjustments/Labs and Tests Ordered: Current medicines are reviewed at length with the patient today.  Concerns regarding medicines are outlined above.   Signed, Janeane Cozart Jr, Kaizley Aja Henry, NP 10/26/2022, 3:36 PM [facility] 

## 2022-10-26 ENCOUNTER — Ambulatory Visit: Payer: Medicare Other | Attending: Nurse Practitioner | Admitting: Nurse Practitioner

## 2022-10-26 ENCOUNTER — Encounter: Payer: Self-pay | Admitting: Nurse Practitioner

## 2022-10-26 VITALS — BP 132/79 | HR 84 | Ht 67.0 in | Wt 279.8 lb

## 2022-10-26 DIAGNOSIS — I10 Essential (primary) hypertension: Secondary | ICD-10-CM | POA: Diagnosis not present

## 2022-10-26 DIAGNOSIS — D631 Anemia in chronic kidney disease: Secondary | ICD-10-CM | POA: Diagnosis not present

## 2022-10-26 DIAGNOSIS — E1122 Type 2 diabetes mellitus with diabetic chronic kidney disease: Secondary | ICD-10-CM

## 2022-10-26 DIAGNOSIS — N2581 Secondary hyperparathyroidism of renal origin: Secondary | ICD-10-CM | POA: Diagnosis not present

## 2022-10-26 DIAGNOSIS — I5032 Chronic diastolic (congestive) heart failure: Secondary | ICD-10-CM | POA: Diagnosis not present

## 2022-10-26 DIAGNOSIS — N186 End stage renal disease: Secondary | ICD-10-CM | POA: Diagnosis not present

## 2022-10-26 DIAGNOSIS — I371 Nonrheumatic pulmonary valve insufficiency: Secondary | ICD-10-CM | POA: Diagnosis not present

## 2022-10-26 DIAGNOSIS — Z992 Dependence on renal dialysis: Secondary | ICD-10-CM | POA: Diagnosis not present

## 2022-10-26 NOTE — Patient Instructions (Addendum)
Medication Instructions:  Your physician recommends that you continue on your current medications as directed. Please refer to the Current Medication list given to you today. *If you need a refill on your cardiac medications before your next appointment, please call your pharmacy*   Lab Work: TODAY-BMET & CBC If you have labs (blood work) drawn today and your tests are completely normal, you will receive your results only by: MyChart Message (if you have MyChart) OR A paper copy in the mail If you have any lab test that is abnormal or we need to change your treatment, we will call you to review the results.   Testing/Procedures: Your physician has requested that you have a TEE. During a TEE, sound waves are used to create images of your heart. It provides your doctor with information about the size and shape of your heart and how well your heart's chambers and valves are working. In this test, a transducer is attached to the end of a flexible tube that's guided down your throat and into your esophagus (the tube leading from you mouth to your stomach) to get a more detailed image of your heart. You are not awake for the procedure. Please see the instruction sheet given to you today. For further information please visit [email or web address]   Follow-Up: At [facility], you and your health needs are our priority.  As part of our continuing mission to provide you with exceptional heart care, we have created designated Provider Care Teams.  These Care Teams include your primary Cardiologist (physician) and Advanced Practice Providers (APPs -  Physician Assistants and Nurse Practitioners) who all work together to provide you with the care you need, when you need it.  We recommend signing up for the patient portal called "MyChart".  Sign up information is provided on this After Visit Summary.  MyChart is used to connect with patients for Virtual Visits (Telemedicine).  Patients are able to  view lab/test results, encounter notes, upcoming appointments, etc.  Non-urgent messages can be sent to your provider as well.   To learn more about what you can do with MyChart, go to [email or web address].    Your next appointment:   12 month(s)  Provider:    Quintella Reichert, MD  Other Instructions          Dear Adrian Frye   You are scheduled for a TEE (Transesophageal Echocardiogram) on Monday, June 17 with Dr. Armanda Magic.  Please arrive at the [facility] (Main Entrance A) at [facility]: [address] [place], [place] [address] at 10:00 AM (This time is 1.5 hour(s) before your procedure to ensure your preparation). Free valet parking service is available. You will check in at ADMITTING. The support person will be asked to wait in the waiting room.  It is OK to have someone drop you off and come back when you are ready to be discharged.      DIET:  Nothing to eat or drink after midnight except a sip of water with medications (see medication instructions below)  MEDICATION INSTRUCTIONS: !!IF ANY NEW MEDICATIONS ARE STARTED AFTER TODAY, PLEASE NOTIFY YOUR PROVIDER AS SOON AS POSSIBLE!!  FYI: Medications such as Semaglutide (Ozempic, [place]), Tirzepatide (Mounjaro, Zepbound), Dulaglutide (Trulicity), etc ("GLP1 agonists") AND Canagliflozin (Invokana), Dapagliflozin (Farxiga), Empagliflozin (Jardiance), Ertugliflozin (Steglatro), Bexagliflozin [employer]) or any combination with one of these drugs such as Invokamet (Canagliflozin/Metformin), Synjardy (Empagliflozin/Metformin), etc ("SGLT2 inhibitors") must be held around the time of a procedure. This is not  a comprehensive list of all of these drugs. Please review all of your medications and talk to your provider if you take any one of these. If you are not sure, ask your provider.    LABS:   Come to the lab at [facility] at [address] between the hours of 8:00 am and 4:30 pm. You do NOT have  to be fasting.  FYI:  For your safety, and to allow [place] to monitor your vital signs accurately during the surgery/procedure we request: If you have artificial nails, gel coating, SNS etc, please have those removed prior to your surgery/procedure. Not having the nail coverings /polish removed may result in cancellation or delay of your surgery/procedure.  You must have a responsible person to drive you home and stay in the waiting area during your procedure. Failure to do so could result in cancellation.  Bring your insurance cards.  *Special Note: Every effort is made to have your procedure done on time. Occasionally there are emergencies that occur at the hospital that may cause delays. Please be patient if a delay does occur.

## 2022-10-27 ENCOUNTER — Encounter: Payer: Self-pay | Admitting: Podiatry

## 2022-10-27 DIAGNOSIS — N186 End stage renal disease: Secondary | ICD-10-CM | POA: Diagnosis not present

## 2022-10-27 DIAGNOSIS — D631 Anemia in chronic kidney disease: Secondary | ICD-10-CM | POA: Diagnosis not present

## 2022-10-27 DIAGNOSIS — N2581 Secondary hyperparathyroidism of renal origin: Secondary | ICD-10-CM | POA: Diagnosis not present

## 2022-10-27 DIAGNOSIS — Z992 Dependence on renal dialysis: Secondary | ICD-10-CM | POA: Diagnosis not present

## 2022-10-27 LAB — CBC
Hematocrit: 30 % — ABNORMAL LOW (ref 37.5–51.0)
Hemoglobin: 9.5 g/dL — ABNORMAL LOW (ref 13.0–17.7)
MCH: 28.3 pg (ref 26.6–33.0)
MCHC: 31.7 g/dL (ref 31.5–35.7)
MCV: 89 fL (ref 79–97)
Platelets: 132 10*3/uL — ABNORMAL LOW (ref 150–450)
RBC: 3.36 x10E6/uL — ABNORMAL LOW (ref 4.14–5.80)
RDW: 16.2 % — ABNORMAL HIGH (ref 11.6–15.4)
WBC: 6.1 10*3/uL (ref 3.4–10.8)

## 2022-10-27 LAB — BASIC METABOLIC PANEL
BUN/Creatinine Ratio: 4 — ABNORMAL LOW (ref 9–20)
BUN: 74 mg/dL — ABNORMAL HIGH (ref 6–24)
CO2: 19 mmol/L — ABNORMAL LOW (ref 20–29)
Calcium: 8.4 mg/dL — ABNORMAL LOW (ref 8.7–10.2)
Chloride: 96 mmol/L (ref 96–106)
Creatinine, Ser: 17.61 mg/dL (ref 0.76–1.27)
Glucose: 81 mg/dL (ref 70–99)
Potassium: 3.9 mmol/L (ref 3.5–5.2)
Sodium: 140 mmol/L (ref 134–144)
eGFR: 3 mL/min/{1.73_m2} — ABNORMAL LOW (ref >59)

## 2022-10-27 NOTE — Progress Notes (Signed)
Chief Complaint  Patient presents with   Nail Problem    Rm 12 Bilateral hallux nail fungus x 2 years.    Callouses    Corn or callus of bilateral arch. Pt requested debridement.     HPI: 52 y.o. male presenting today with several concerns today.  First he had a painful skin lesion on the plantar medial aspect of the right instep.  Denies stepping on a foreign object.  States that it can get quite tender.  Notes that he can sometimes pick the area out on its own.  He like to see if this could be resolved.  Also had concern of thick, discolored, painful toenails on bilateral hallux.  He is open to treatment options today.  Thirdly, he is requesting shoe recommendations to be written down and provided for him today.  He states that his phone is not working correctly for him to document in his phone.    Past Medical History:  Diagnosis Date   Allergic rhinitis    Allergy    Anemia    Asthma    as a child   Bacteremia    Benign essential hypertension    Carotid artery stenosis, asymptomatic, left    1-39% left by dopplers 10/2022   CHF (congestive heart failure) (HCC)    Dilated idiopathic cardiomyopathy (HCC)    now resoved with EF 55% by echo 2011   Edema    Encounter for blood transfusion    Endocarditis    ESRD (end stage renal disease) (HCC)    ESRD on peritoneal dialysis (HCC)    Heart disease    Hyperlipidemia    Hyperparathyroidism, secondary (HCC)    Hypertension    Morbid obesity (HCC)    Sleep apnea    c-pap   UGI bleed 2011   ASA    Past Surgical History:  Procedure Laterality Date   ACHILLES TENDON REPAIR     ruptured; right   AV FISTULA PLACEMENT Left 11/03/2017   Procedure: ARTERIOVENOUS (AV) FISTULA CREATION  left upper arm;  Surgeon: Larina Earthly, MD;  Location: [facility] OR;  Service: Vascular;  Laterality: Left;   COLONOSCOPY     COLONOSCOPY WITH PROPOFOL N/A 08/11/2022   Procedure: COLONOSCOPY WITH PROPOFOL;  Surgeon: Hilarie Fredrickson, MD;  Location: [facility]  ENDOSCOPY;  Service: Gastroenterology;  Laterality: N/A;   DIALYSIS FISTULA CREATION     INSERTION OF DIALYSIS CATHETER N/A 11/03/2017   Procedure: INSERTION OF DIALYSIS CATHETER - RIGHT INTERNAL JUGULAR PLACEMENT;  Surgeon: Larina Earthly, MD;  Location: [facility] OR;  Service: Vascular;  Laterality: N/A;   IR FLUORO GUIDE CV LINE LEFT  01/24/2020   IR REMOVAL TUN CV CATH W/O FL  01/21/2020   IR REMOVAL TUN CV CATH W/O FL  03/06/2020   IR US GUIDE VASC ACCESS LEFT  01/24/2020   KIDNEY FAILURE     LAPAROSCOPIC GASTROTOMY W/ REPAIR OF ULCER     PLEURAL SCARIFICATION     left, football trauma-chest tube   STOMACH SURGERY     TEE WITHOUT CARDIOVERSION N/A 01/22/2020   Procedure: TRANSESOPHAGEAL ECHOCARDIOGRAM (TEE);  Surgeon: Chrystie Nose, MD;  Location: [facility] ENDOSCOPY;  Service: Cardiovascular;  Laterality: N/A;   TONSILLECTOMY     TONSILLECTOMY     UPPER GASTROINTESTINAL ENDOSCOPY      Allergies  Allergen Reactions   Ace Inhibitors Anaphylaxis and Swelling   Lisinopril Swelling and Anaphylaxis    Angioedema  Other  reaction(s): Angioedema (ALLERGY/intolerance)   Losartan Swelling and Anaphylaxis     Physical Exam:  General: The patient is alert and oriented x3 in no acute distress.  Dermatology: Skin is warm, dry and supple bilateral lower extremities. Interspaces are clear of maceration and debris.  There is a painful, focal skin lesion on plantarmedial aspect of right instep.  No surrounding erythema or discoloration.  No drainage.  The hallux nails are 3 mm thick with yellow discoloration, subungual debris, distal onycholysis and pain with compression.  Vascular: Palpable pedal pulses bilaterally. Capillary refill within normal limits.  Mild lower leg and ankle edema bilateral..  No erythema or calor.  Neurological: Light touch sensation grossly intact bilateral feet.   Musculoskeletal Exam: Collapsed midfoot/flatfoot bilateral.  Assessment/Plan of Care: 1. Benign skin lesion    2. Pain due to onychomycosis of toenails of both feet   3. Generalized edema     Discussed clinical findings with patient today.  Patient skin lesion was sharply debrided with a sterile #312 blade.  After shaving the lesion, the skin lesion was treated with Cantharone Plus solution and covered with a Band-Aid.  He was informed that he may experience mild discomfort and small blister tomorrow.  He can remove the Band-Aid 5 hours from now.  No further treatment needed from the patient at home.  Will follow-up in approximately 4 weeks to recheck the skin lesion.  Regarding his toenails they were debrided and recommended formula 7 solution for the nails daily.  He was instructed this can take 6 months to a year of continued use to see improvement.  He understood.  For shoe recommendations, recommended Shon Baton, New Balance, Gravity DeFyer and [employer] brand shoes.  After discussing this with the patient, noting it in the chart, it was handwritten for him as well at his request.  Clerance Lav, DPM, FACFAS [facility]     [address] [place], [place] [address]                Office [phone]  Fax [phone]

## 2022-10-28 DIAGNOSIS — N2581 Secondary hyperparathyroidism of renal origin: Secondary | ICD-10-CM | POA: Diagnosis not present

## 2022-10-28 DIAGNOSIS — D631 Anemia in chronic kidney disease: Secondary | ICD-10-CM | POA: Diagnosis not present

## 2022-10-28 DIAGNOSIS — N186 End stage renal disease: Secondary | ICD-10-CM | POA: Diagnosis not present

## 2022-10-28 DIAGNOSIS — Z992 Dependence on renal dialysis: Secondary | ICD-10-CM | POA: Diagnosis not present

## 2022-10-28 NOTE — Progress Notes (Signed)
Instructed patient on following items: Arrival time 1030 NPO after MN... Bp and anticoagulation meds ok with sips of water AM of procedure Responsible party to stay home after procedure and for 24hrs

## 2022-10-29 DIAGNOSIS — D631 Anemia in chronic kidney disease: Secondary | ICD-10-CM | POA: Diagnosis not present

## 2022-10-29 DIAGNOSIS — N2581 Secondary hyperparathyroidism of renal origin: Secondary | ICD-10-CM | POA: Diagnosis not present

## 2022-10-29 DIAGNOSIS — N186 End stage renal disease: Secondary | ICD-10-CM | POA: Diagnosis not present

## 2022-10-29 DIAGNOSIS — Z992 Dependence on renal dialysis: Secondary | ICD-10-CM | POA: Diagnosis not present

## 2022-10-30 DIAGNOSIS — D631 Anemia in chronic kidney disease: Secondary | ICD-10-CM | POA: Diagnosis not present

## 2022-10-30 DIAGNOSIS — N2581 Secondary hyperparathyroidism of renal origin: Secondary | ICD-10-CM | POA: Diagnosis not present

## 2022-10-30 DIAGNOSIS — Z992 Dependence on renal dialysis: Secondary | ICD-10-CM | POA: Diagnosis not present

## 2022-10-30 DIAGNOSIS — N186 End stage renal disease: Secondary | ICD-10-CM | POA: Diagnosis not present

## 2022-10-31 ENCOUNTER — Ambulatory Visit (HOSPITAL_COMMUNITY): Payer: Medicare Other | Admitting: Certified Registered Nurse Anesthetist

## 2022-10-31 ENCOUNTER — Ambulatory Visit (HOSPITAL_BASED_OUTPATIENT_CLINIC_OR_DEPARTMENT_OTHER)
Admission: RE | Admit: 2022-10-31 | Discharge: 2022-10-31 | Disposition: A | Discharge status: Home / Self Care | Payer: Medicare Other | Source: Ambulatory Visit | Attending: Nurse Practitioner | Admitting: Nurse Practitioner

## 2022-10-31 ENCOUNTER — Other Ambulatory Visit: Payer: Self-pay

## 2022-10-31 ENCOUNTER — Ambulatory Visit (HOSPITAL_COMMUNITY)
Admission: RE | Admit: 2022-10-31 | Discharge: 2022-10-31 | Disposition: A | Discharge status: Home / Self Care | Payer: Medicare Other | Attending: Cardiology | Admitting: Cardiology

## 2022-10-31 ENCOUNTER — Ambulatory Visit (HOSPITAL_BASED_OUTPATIENT_CLINIC_OR_DEPARTMENT_OTHER): Payer: Medicare Other | Admitting: Certified Registered Nurse Anesthetist

## 2022-10-31 ENCOUNTER — Encounter (HOSPITAL_COMMUNITY): Payer: Self-pay | Admitting: Cardiology

## 2022-10-31 ENCOUNTER — Encounter (HOSPITAL_COMMUNITY)
Admission: RE | Disposition: A | Discharge status: Home / Self Care | Payer: Self-pay | Source: Home / Self Care | Attending: Cardiology

## 2022-10-31 DIAGNOSIS — I509 Heart failure, unspecified: Secondary | ICD-10-CM | POA: Diagnosis not present

## 2022-10-31 DIAGNOSIS — I132 Hypertensive heart and chronic kidney disease with heart failure and with stage 5 chronic kidney disease, or end stage renal disease: Secondary | ICD-10-CM | POA: Diagnosis not present

## 2022-10-31 DIAGNOSIS — G4733 Obstructive sleep apnea (adult) (pediatric): Secondary | ICD-10-CM | POA: Diagnosis not present

## 2022-10-31 DIAGNOSIS — I5032 Chronic diastolic (congestive) heart failure: Secondary | ICD-10-CM | POA: Insufficient documentation

## 2022-10-31 DIAGNOSIS — I130 Hypertensive heart and chronic kidney disease with heart failure and stage 1 through stage 4 chronic kidney disease, or unspecified chronic kidney disease: Secondary | ICD-10-CM | POA: Diagnosis not present

## 2022-10-31 DIAGNOSIS — N186 End stage renal disease: Secondary | ICD-10-CM | POA: Diagnosis not present

## 2022-10-31 DIAGNOSIS — I371 Nonrheumatic pulmonary valve insufficiency: Secondary | ICD-10-CM | POA: Diagnosis not present

## 2022-10-31 DIAGNOSIS — Z6841 Body Mass Index (BMI) 40.0 and over, adult: Secondary | ICD-10-CM | POA: Diagnosis not present

## 2022-10-31 DIAGNOSIS — I110 Hypertensive heart disease with heart failure: Secondary | ICD-10-CM

## 2022-10-31 DIAGNOSIS — I088 Other rheumatic multiple valve diseases: Secondary | ICD-10-CM | POA: Diagnosis not present

## 2022-10-31 DIAGNOSIS — E1122 Type 2 diabetes mellitus with diabetic chronic kidney disease: Secondary | ICD-10-CM | POA: Insufficient documentation

## 2022-10-31 DIAGNOSIS — Z992 Dependence on renal dialysis: Secondary | ICD-10-CM | POA: Diagnosis not present

## 2022-10-31 DIAGNOSIS — E669 Obesity, unspecified: Secondary | ICD-10-CM | POA: Insufficient documentation

## 2022-10-31 DIAGNOSIS — J45909 Unspecified asthma, uncomplicated: Secondary | ICD-10-CM | POA: Diagnosis not present

## 2022-10-31 DIAGNOSIS — Z87891 Personal history of nicotine dependence: Secondary | ICD-10-CM

## 2022-10-31 DIAGNOSIS — D631 Anemia in chronic kidney disease: Secondary | ICD-10-CM | POA: Diagnosis not present

## 2022-10-31 DIAGNOSIS — N2581 Secondary hyperparathyroidism of renal origin: Secondary | ICD-10-CM | POA: Diagnosis not present

## 2022-10-31 HISTORY — PX: TEE WITHOUT CARDIOVERSION: SHX5443

## 2022-10-31 LAB — ECHO TEE

## 2022-10-31 SURGERY — ECHOCARDIOGRAM, TRANSESOPHAGEAL
Anesthesia: General

## 2022-10-31 MED ORDER — PHENYLEPHRINE HCL-NACL 20-0.9 MG/250ML-% IV SOLN
INTRAVENOUS | Status: DC | PRN
  Administered 2022-10-31: 50 ug/min via INTRAVENOUS

## 2022-10-31 MED ORDER — PROPOFOL 10 MG/ML IV BOLUS
INTRAVENOUS | Status: DC | PRN
  Administered 2022-10-31: 50 mg via INTRAVENOUS

## 2022-10-31 MED ORDER — PROPOFOL 500 MG/50ML IV EMUL
INTRAVENOUS | Status: DC | PRN
  Administered 2022-10-31: 75 ug/kg/min via INTRAVENOUS

## 2022-10-31 MED ORDER — SODIUM CHLORIDE 0.9 % IV SOLN: INTRAVENOUS | Status: DC | PRN

## 2022-10-31 MED ORDER — LIDOCAINE 2% (20 MG/ML) 5 ML SYRINGE
INTRAMUSCULAR | Status: DC | PRN
  Administered 2022-10-31: 50 mg via INTRAVENOUS

## 2022-10-31 NOTE — Transfer of Care (Signed)
Immediate Anesthesia Transfer of Care Note  Patient: Duquan Welz  Procedure(s) Performed: TRANSESOPHAGEAL ECHOCARDIOGRAM  Patient Location: Cath Lab  Anesthesia Type:MAC  Level of Consciousness: awake, alert , and oriented  Airway & Oxygen Therapy: Patient Spontanous Breathing and Patient connected to nasal cannula oxygen  Post-op Assessment: Report given to RN, Post -op Vital signs reviewed and stable, Patient moving all extremities X 4, and Patient able to stick tongue midline  Post vital signs: Reviewed  Last Vitals:  Vitals Value Taken Time  BP 116/78   Temp 36.8   Pulse 78   Resp 15   SpO2 100     Last Pain:  Vitals:   10/31/22 1104  TempSrc:   PainSc: 0-No pain         Complications: No notable events documented.

## 2022-10-31 NOTE — Anesthesia Preprocedure Evaluation (Addendum)
Anesthesia Evaluation  Patient identified by MRN, date of birth, ID band Patient awake    Reviewed: Allergy & Precautions, NPO status , Patient's Chart, lab work & pertinent test results  History of Anesthesia Complications Negative for: history of anesthetic complications  Airway Mallampati: III  TM Distance: >3 FB Neck ROM: Full    Dental no notable dental hx. (+) Dental Advisory Given   Pulmonary asthma , sleep apnea , former smoker   Pulmonary exam normal        Cardiovascular hypertension, Pt. on medications +CHF  Normal cardiovascular exam  moderate-severe pumonary valve regurgitation  TEE IMPRESSIONS     1. Left ventricular ejection fraction, by estimation, is 50 to 55%. The  left ventricle has low normal function. Paradoxical septal bounce. There  is moderate concentric left ventricular hypertrophy. Left ventricular  diastolic parameters are consistent  with Grade I diastolic dysfunction (impaired relaxation).   2. Right ventricular systolic function is normal. The right ventricular  size is normal.   3. The mitral valve is normal in structure. No evidence of mitral valve  regurgitation. No evidence of mitral stenosis.   4. The aortic valve is normal in structure. Aortic valve regurgitation is  not visualized. No aortic stenosis is present.   5. The pulmonic valve is not well visualized; there is significant color  aliasing on evaluation of the valve, however, there appears to be  moderate-severe pumonary valve regurgitation. Consider further imaging for  better evaluation.   6. There is borderline dilatation of the ascending aorta, measuring 39  mm.   7. The inferior vena cava is normal in size with greater than 50%  respiratory variability, suggesting right atrial pressure of 3 mmHg.      Neuro/Psych negative neurological ROS  negative psych ROS   GI/Hepatic Neg liver ROS,,,Colon polyps   Endo/Other   diabetes, Type 2, Oral Hypoglycemic Agents  Morbid obesity  Renal/GU CRFRenal disease     Musculoskeletal negative musculoskeletal ROS (+)    Abdominal   Peds  Hematology  (+) Blood dyscrasia, anemia   Anesthesia Other Findings   Reproductive/Obstetrics                             Anesthesia Physical Anesthesia Plan  ASA: 3  Anesthesia Plan: MAC   Post-op Pain Management: Minimal or no pain anticipated   Induction: Intravenous  PONV Risk Score and Plan: 1 and Propofol infusion and Treatment may vary due to age or medical condition  Airway Management Planned: Simple Face Mask and Nasal Cannula  Additional Equipment: None  Intra-op Plan:   Post-operative Plan:   Informed Consent: I have reviewed the patients History and Physical, chart, labs and discussed the procedure including the risks, benefits and alternatives for the proposed anesthesia with the patient or authorized representative who has indicated his/her understanding and acceptance.     Dental advisory given  Plan Discussed with: Anesthesiologist and CRNA  Anesthesia Plan Comments:        Anesthesia Quick Evaluation

## 2022-10-31 NOTE — CV Procedure (Addendum)
     PROCEDURE NOTE:  Procedure:  Transesophageal echocardiogram Operator:  Armanda Magic, MD Indications:  Pulmonic regurgitation Complications: None  During this procedure the patient is administered a total of Propofol 130 mg to achieve and maintain moderate conscious sedation and Lidocaine 50mg .  The patient's heart rate, blood pressure, and oxygen saturation are monitored continuously during the procedure by anesthesia.   Results: Normal LV size and low normal LV function EF 50-55% with moderate LVH Normal RV size and function Normal RA Normal LA and LA appendage with no thrombus Normal TV with mild TR Poorly visualized PV due to poor contact of probe.  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 Normal MV with trivial MR Trileaflet AV with mild AV sclerosis and no stenosis.  Trivial AI Normal interatrial septum with no evidence of shunt by colorflow dopper  Normal thoracic and ascending aorta. Consider cardiac MRI for better visualization of pulmonic valve  The patient tolerated the procedure well and was transferred back to their room in stable condition.  Signed: Armanda Magic, MD [facility]

## 2022-10-31 NOTE — Interval H&P Note (Signed)
History and Physical Interval Note:  10/31/2022 10:39 AM  Connery Renaee Munda  has presented today for surgery, with the diagnosis of PULMINIC VALVE REGURGITATION.  The various methods of treatment have been discussed with the patient and family. After consideration of risks, benefits and other options for treatment, the patient has consented to  Procedure(s): TRANSESOPHAGEAL ECHOCARDIOGRAM (N/A) as a surgical intervention.  The patient's history has been reviewed, patient examined, no change in status, stable for surgery.  I have reviewed the patient's chart and labs.  Questions were answered to the patient's satisfaction.    Informed Consent   Shared Decision Making/Informed Consent The risks [esophageal damage, perforation (1:10,000 risk), bleeding, pharyngeal hematoma as well as other potential complications associated with conscious sedation including aspiration, arrhythmia, respiratory failure and death], benefits (treatment guidance and diagnostic support) and alternatives of a transesophageal echocardiogram were discussed in detail with Mr. Parr and he is willing to proceed.       Armanda Magic

## 2022-10-31 NOTE — Anesthesia Postprocedure Evaluation (Signed)
Anesthesia Post Note  Patient: Adrian Frye  Procedure(s) Performed: TRANSESOPHAGEAL ECHOCARDIOGRAM     Patient location during evaluation: PACU Anesthesia Type: General Level of consciousness: sedated Pain management: pain level controlled Vital Signs Assessment: post-procedure vital signs reviewed and stable Respiratory status: spontaneous breathing and respiratory function stable Cardiovascular status: stable Postop Assessment: no apparent nausea or vomiting Anesthetic complications: no  No notable events documented.  Last Vitals:  Vitals:   10/31/22 1215 10/31/22 1220  BP: (!) 163/75 (!) 165/70  Pulse:    Resp:    Temp:    SpO2:      Last Pain:  Vitals:   10/31/22 1220  TempSrc:   PainSc: 0-No pain                 Meghan Warshawsky DANIEL

## 2022-11-01 DIAGNOSIS — D631 Anemia in chronic kidney disease: Secondary | ICD-10-CM | POA: Diagnosis not present

## 2022-11-01 DIAGNOSIS — N186 End stage renal disease: Secondary | ICD-10-CM | POA: Diagnosis not present

## 2022-11-01 DIAGNOSIS — Z992 Dependence on renal dialysis: Secondary | ICD-10-CM | POA: Diagnosis not present

## 2022-11-01 DIAGNOSIS — N2581 Secondary hyperparathyroidism of renal origin: Secondary | ICD-10-CM | POA: Diagnosis not present

## 2022-11-02 ENCOUNTER — Telehealth: Payer: Self-pay

## 2022-11-02 DIAGNOSIS — D631 Anemia in chronic kidney disease: Secondary | ICD-10-CM | POA: Diagnosis not present

## 2022-11-02 DIAGNOSIS — Z992 Dependence on renal dialysis: Secondary | ICD-10-CM | POA: Diagnosis not present

## 2022-11-02 DIAGNOSIS — I371 Nonrheumatic pulmonary valve insufficiency: Secondary | ICD-10-CM

## 2022-11-02 DIAGNOSIS — N186 End stage renal disease: Secondary | ICD-10-CM | POA: Diagnosis not present

## 2022-11-02 DIAGNOSIS — N2581 Secondary hyperparathyroidism of renal origin: Secondary | ICD-10-CM | POA: Diagnosis not present

## 2022-11-02 NOTE — Telephone Encounter (Signed)
Spoke to patient regarding TEE results and Dr. Norris Cross recommendation for cardiac MRI w/ no contrast. Patient verbalizes understanding and agrees to plan. Orders placed.

## 2022-11-02 NOTE — Telephone Encounter (Signed)
-----   Message from Quintella Reichert, MD sent at 11/02/2022  8:17 AM EDT ----- Thanks Melvern Banker please order a cardiac MRI with no contrast for pulmonic regurgitation  Traci ----- Message ----- From: Little Ishikawa, MD Sent: 11/02/2022   8:12 AM EDT To: Quintella Reichert, MD; Gaston Islam., NP  Mayo Ao, I agree with getting the MRI.  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  TEE not great images although PV very leaky.  Wanted to get cMRI but he is a peritoneal dialysis patient so likely cannot do MRI.  There is no right sided enlargement - would you do anything for just continue to follow echoes for right sided enlargement ----- Message ----- From: Gaston Islam., NP Sent: 11/01/2022   7:25 AM EDT To: Quintella Reichert, MD; Alveta Heimlich, CMA  Please let Mr. Lucke know that his TEE results revealed a low normal heart pumping function with moderate left ventricular wall thickness due to hypertension.  The pulmonic valve showed severe regurgitation and per Dr.Turner's recommendation we will have patient complete cardiac MRI for further assessment.  Please let me know if you have any additional questions.  Robin Searing, NP

## 2022-11-03 DIAGNOSIS — D631 Anemia in chronic kidney disease: Secondary | ICD-10-CM | POA: Diagnosis not present

## 2022-11-03 DIAGNOSIS — Z992 Dependence on renal dialysis: Secondary | ICD-10-CM | POA: Diagnosis not present

## 2022-11-03 DIAGNOSIS — N2581 Secondary hyperparathyroidism of renal origin: Secondary | ICD-10-CM | POA: Diagnosis not present

## 2022-11-03 DIAGNOSIS — N186 End stage renal disease: Secondary | ICD-10-CM | POA: Diagnosis not present

## 2022-11-04 ENCOUNTER — Encounter: Payer: Self-pay | Admitting: Cardiology

## 2022-11-04 ENCOUNTER — Telehealth: Payer: Self-pay

## 2022-11-04 DIAGNOSIS — D631 Anemia in chronic kidney disease: Secondary | ICD-10-CM | POA: Diagnosis not present

## 2022-11-04 DIAGNOSIS — N2581 Secondary hyperparathyroidism of renal origin: Secondary | ICD-10-CM | POA: Diagnosis not present

## 2022-11-04 DIAGNOSIS — Z992 Dependence on renal dialysis: Secondary | ICD-10-CM | POA: Diagnosis not present

## 2022-11-04 DIAGNOSIS — N186 End stage renal disease: Secondary | ICD-10-CM | POA: Diagnosis not present

## 2022-11-04 NOTE — Telephone Encounter (Signed)
Called patient to answer any questions regarding cardiac MRI. Patient states someone already called and and answered his questions. Reviewed date of cardiac MRI.

## 2022-11-05 DIAGNOSIS — N186 End stage renal disease: Secondary | ICD-10-CM | POA: Diagnosis not present

## 2022-11-05 DIAGNOSIS — N2581 Secondary hyperparathyroidism of renal origin: Secondary | ICD-10-CM | POA: Diagnosis not present

## 2022-11-05 DIAGNOSIS — D631 Anemia in chronic kidney disease: Secondary | ICD-10-CM | POA: Diagnosis not present

## 2022-11-05 DIAGNOSIS — Z992 Dependence on renal dialysis: Secondary | ICD-10-CM | POA: Diagnosis not present

## 2022-11-06 DIAGNOSIS — Z992 Dependence on renal dialysis: Secondary | ICD-10-CM | POA: Diagnosis not present

## 2022-11-06 DIAGNOSIS — N2581 Secondary hyperparathyroidism of renal origin: Secondary | ICD-10-CM | POA: Diagnosis not present

## 2022-11-06 DIAGNOSIS — N186 End stage renal disease: Secondary | ICD-10-CM | POA: Diagnosis not present

## 2022-11-06 DIAGNOSIS — D631 Anemia in chronic kidney disease: Secondary | ICD-10-CM | POA: Diagnosis not present

## 2022-11-07 DIAGNOSIS — D631 Anemia in chronic kidney disease: Secondary | ICD-10-CM | POA: Diagnosis not present

## 2022-11-07 DIAGNOSIS — Z992 Dependence on renal dialysis: Secondary | ICD-10-CM | POA: Diagnosis not present

## 2022-11-07 DIAGNOSIS — N2581 Secondary hyperparathyroidism of renal origin: Secondary | ICD-10-CM | POA: Diagnosis not present

## 2022-11-07 DIAGNOSIS — N186 End stage renal disease: Secondary | ICD-10-CM | POA: Diagnosis not present

## 2022-11-08 DIAGNOSIS — Z992 Dependence on renal dialysis: Secondary | ICD-10-CM | POA: Diagnosis not present

## 2022-11-08 DIAGNOSIS — N2581 Secondary hyperparathyroidism of renal origin: Secondary | ICD-10-CM | POA: Diagnosis not present

## 2022-11-08 DIAGNOSIS — D631 Anemia in chronic kidney disease: Secondary | ICD-10-CM | POA: Diagnosis not present

## 2022-11-08 DIAGNOSIS — N186 End stage renal disease: Secondary | ICD-10-CM | POA: Diagnosis not present

## 2022-11-09 DIAGNOSIS — Z992 Dependence on renal dialysis: Secondary | ICD-10-CM | POA: Diagnosis not present

## 2022-11-09 DIAGNOSIS — D631 Anemia in chronic kidney disease: Secondary | ICD-10-CM | POA: Diagnosis not present

## 2022-11-09 DIAGNOSIS — N2581 Secondary hyperparathyroidism of renal origin: Secondary | ICD-10-CM | POA: Diagnosis not present

## 2022-11-09 DIAGNOSIS — N186 End stage renal disease: Secondary | ICD-10-CM | POA: Diagnosis not present

## 2022-11-10 DIAGNOSIS — N2581 Secondary hyperparathyroidism of renal origin: Secondary | ICD-10-CM | POA: Diagnosis not present

## 2022-11-10 DIAGNOSIS — N186 End stage renal disease: Secondary | ICD-10-CM | POA: Diagnosis not present

## 2022-11-10 DIAGNOSIS — D631 Anemia in chronic kidney disease: Secondary | ICD-10-CM | POA: Diagnosis not present

## 2022-11-10 DIAGNOSIS — Z992 Dependence on renal dialysis: Secondary | ICD-10-CM | POA: Diagnosis not present

## 2022-11-11 DIAGNOSIS — N186 End stage renal disease: Secondary | ICD-10-CM | POA: Diagnosis not present

## 2022-11-11 DIAGNOSIS — N2581 Secondary hyperparathyroidism of renal origin: Secondary | ICD-10-CM | POA: Diagnosis not present

## 2022-11-11 DIAGNOSIS — D631 Anemia in chronic kidney disease: Secondary | ICD-10-CM | POA: Diagnosis not present

## 2022-11-11 DIAGNOSIS — Z992 Dependence on renal dialysis: Secondary | ICD-10-CM | POA: Diagnosis not present

## 2022-11-12 DIAGNOSIS — Z992 Dependence on renal dialysis: Secondary | ICD-10-CM | POA: Diagnosis not present

## 2022-11-12 DIAGNOSIS — N186 End stage renal disease: Secondary | ICD-10-CM | POA: Diagnosis not present

## 2022-11-12 DIAGNOSIS — D631 Anemia in chronic kidney disease: Secondary | ICD-10-CM | POA: Diagnosis not present

## 2022-11-12 DIAGNOSIS — N2581 Secondary hyperparathyroidism of renal origin: Secondary | ICD-10-CM | POA: Diagnosis not present

## 2022-11-13 DIAGNOSIS — Z992 Dependence on renal dialysis: Secondary | ICD-10-CM | POA: Diagnosis not present

## 2022-11-13 DIAGNOSIS — N186 End stage renal disease: Secondary | ICD-10-CM | POA: Diagnosis not present

## 2022-11-13 DIAGNOSIS — D631 Anemia in chronic kidney disease: Secondary | ICD-10-CM | POA: Diagnosis not present

## 2022-11-13 DIAGNOSIS — E1122 Type 2 diabetes mellitus with diabetic chronic kidney disease: Secondary | ICD-10-CM | POA: Diagnosis not present

## 2022-11-13 DIAGNOSIS — N2581 Secondary hyperparathyroidism of renal origin: Secondary | ICD-10-CM | POA: Diagnosis not present

## 2022-11-14 DIAGNOSIS — N186 End stage renal disease: Secondary | ICD-10-CM | POA: Diagnosis not present

## 2022-11-14 DIAGNOSIS — Z992 Dependence on renal dialysis: Secondary | ICD-10-CM | POA: Diagnosis not present

## 2022-11-14 DIAGNOSIS — D631 Anemia in chronic kidney disease: Secondary | ICD-10-CM | POA: Diagnosis not present

## 2022-11-14 DIAGNOSIS — Z4932 Encounter for adequacy testing for peritoneal dialysis: Secondary | ICD-10-CM | POA: Diagnosis not present

## 2022-11-14 DIAGNOSIS — N2581 Secondary hyperparathyroidism of renal origin: Secondary | ICD-10-CM | POA: Diagnosis not present

## 2022-11-15 DIAGNOSIS — Z992 Dependence on renal dialysis: Secondary | ICD-10-CM | POA: Diagnosis not present

## 2022-11-15 DIAGNOSIS — N186 End stage renal disease: Secondary | ICD-10-CM | POA: Diagnosis not present

## 2022-11-15 DIAGNOSIS — Z4932 Encounter for adequacy testing for peritoneal dialysis: Secondary | ICD-10-CM | POA: Diagnosis not present

## 2022-11-15 DIAGNOSIS — N2581 Secondary hyperparathyroidism of renal origin: Secondary | ICD-10-CM | POA: Diagnosis not present

## 2022-11-15 DIAGNOSIS — D631 Anemia in chronic kidney disease: Secondary | ICD-10-CM | POA: Diagnosis not present

## 2022-11-16 DIAGNOSIS — D631 Anemia in chronic kidney disease: Secondary | ICD-10-CM | POA: Diagnosis not present

## 2022-11-16 DIAGNOSIS — Z4932 Encounter for adequacy testing for peritoneal dialysis: Secondary | ICD-10-CM | POA: Diagnosis not present

## 2022-11-16 DIAGNOSIS — Z992 Dependence on renal dialysis: Secondary | ICD-10-CM | POA: Diagnosis not present

## 2022-11-16 DIAGNOSIS — N186 End stage renal disease: Secondary | ICD-10-CM | POA: Diagnosis not present

## 2022-11-16 DIAGNOSIS — N2581 Secondary hyperparathyroidism of renal origin: Secondary | ICD-10-CM | POA: Diagnosis not present

## 2022-11-17 DIAGNOSIS — N2581 Secondary hyperparathyroidism of renal origin: Secondary | ICD-10-CM | POA: Diagnosis not present

## 2022-11-17 DIAGNOSIS — D631 Anemia in chronic kidney disease: Secondary | ICD-10-CM | POA: Diagnosis not present

## 2022-11-17 DIAGNOSIS — Z992 Dependence on renal dialysis: Secondary | ICD-10-CM | POA: Diagnosis not present

## 2022-11-17 DIAGNOSIS — N186 End stage renal disease: Secondary | ICD-10-CM | POA: Diagnosis not present

## 2022-11-17 DIAGNOSIS — Z4932 Encounter for adequacy testing for peritoneal dialysis: Secondary | ICD-10-CM | POA: Diagnosis not present

## 2022-11-18 DIAGNOSIS — N2581 Secondary hyperparathyroidism of renal origin: Secondary | ICD-10-CM | POA: Diagnosis not present

## 2022-11-18 DIAGNOSIS — Z4932 Encounter for adequacy testing for peritoneal dialysis: Secondary | ICD-10-CM | POA: Diagnosis not present

## 2022-11-18 DIAGNOSIS — Z992 Dependence on renal dialysis: Secondary | ICD-10-CM | POA: Diagnosis not present

## 2022-11-18 DIAGNOSIS — N186 End stage renal disease: Secondary | ICD-10-CM | POA: Diagnosis not present

## 2022-11-18 DIAGNOSIS — D631 Anemia in chronic kidney disease: Secondary | ICD-10-CM | POA: Diagnosis not present

## 2022-11-19 DIAGNOSIS — N186 End stage renal disease: Secondary | ICD-10-CM | POA: Diagnosis not present

## 2022-11-19 DIAGNOSIS — D631 Anemia in chronic kidney disease: Secondary | ICD-10-CM | POA: Diagnosis not present

## 2022-11-19 DIAGNOSIS — N2581 Secondary hyperparathyroidism of renal origin: Secondary | ICD-10-CM | POA: Diagnosis not present

## 2022-11-19 DIAGNOSIS — Z4932 Encounter for adequacy testing for peritoneal dialysis: Secondary | ICD-10-CM | POA: Diagnosis not present

## 2022-11-19 DIAGNOSIS — Z992 Dependence on renal dialysis: Secondary | ICD-10-CM | POA: Diagnosis not present

## 2022-11-20 DIAGNOSIS — D631 Anemia in chronic kidney disease: Secondary | ICD-10-CM | POA: Diagnosis not present

## 2022-11-20 DIAGNOSIS — N2581 Secondary hyperparathyroidism of renal origin: Secondary | ICD-10-CM | POA: Diagnosis not present

## 2022-11-20 DIAGNOSIS — Z992 Dependence on renal dialysis: Secondary | ICD-10-CM | POA: Diagnosis not present

## 2022-11-20 DIAGNOSIS — Z4932 Encounter for adequacy testing for peritoneal dialysis: Secondary | ICD-10-CM | POA: Diagnosis not present

## 2022-11-20 DIAGNOSIS — N186 End stage renal disease: Secondary | ICD-10-CM | POA: Diagnosis not present

## 2022-11-21 DIAGNOSIS — Z4932 Encounter for adequacy testing for peritoneal dialysis: Secondary | ICD-10-CM | POA: Diagnosis not present

## 2022-11-21 DIAGNOSIS — D631 Anemia in chronic kidney disease: Secondary | ICD-10-CM | POA: Diagnosis not present

## 2022-11-21 DIAGNOSIS — Z992 Dependence on renal dialysis: Secondary | ICD-10-CM | POA: Diagnosis not present

## 2022-11-21 DIAGNOSIS — N2581 Secondary hyperparathyroidism of renal origin: Secondary | ICD-10-CM | POA: Diagnosis not present

## 2022-11-21 DIAGNOSIS — N186 End stage renal disease: Secondary | ICD-10-CM | POA: Diagnosis not present

## 2022-11-22 ENCOUNTER — Encounter (INDEPENDENT_AMBULATORY_CARE_PROVIDER_SITE_OTHER): Payer: Medicare Other | Admitting: Podiatry

## 2022-11-22 DIAGNOSIS — N2581 Secondary hyperparathyroidism of renal origin: Secondary | ICD-10-CM | POA: Diagnosis not present

## 2022-11-22 DIAGNOSIS — Z91199 Patient's noncompliance with other medical treatment and regimen due to unspecified reason: Secondary | ICD-10-CM

## 2022-11-22 DIAGNOSIS — N186 End stage renal disease: Secondary | ICD-10-CM | POA: Diagnosis not present

## 2022-11-22 DIAGNOSIS — D631 Anemia in chronic kidney disease: Secondary | ICD-10-CM | POA: Diagnosis not present

## 2022-11-22 DIAGNOSIS — Z4932 Encounter for adequacy testing for peritoneal dialysis: Secondary | ICD-10-CM | POA: Diagnosis not present

## 2022-11-22 DIAGNOSIS — Z992 Dependence on renal dialysis: Secondary | ICD-10-CM | POA: Diagnosis not present

## 2022-11-22 NOTE — Progress Notes (Signed)
Patient was a no-show for scheduled appointment today.

## 2022-11-23 DIAGNOSIS — N186 End stage renal disease: Secondary | ICD-10-CM | POA: Diagnosis not present

## 2022-11-23 DIAGNOSIS — Z992 Dependence on renal dialysis: Secondary | ICD-10-CM | POA: Diagnosis not present

## 2022-11-23 DIAGNOSIS — Z4932 Encounter for adequacy testing for peritoneal dialysis: Secondary | ICD-10-CM | POA: Diagnosis not present

## 2022-11-23 DIAGNOSIS — D631 Anemia in chronic kidney disease: Secondary | ICD-10-CM | POA: Diagnosis not present

## 2022-11-23 DIAGNOSIS — N2581 Secondary hyperparathyroidism of renal origin: Secondary | ICD-10-CM | POA: Diagnosis not present

## 2022-11-24 ENCOUNTER — Encounter: Payer: Self-pay | Admitting: Cardiology

## 2022-11-24 ENCOUNTER — Telehealth: Payer: Self-pay

## 2022-11-24 DIAGNOSIS — D631 Anemia in chronic kidney disease: Secondary | ICD-10-CM | POA: Diagnosis not present

## 2022-11-24 DIAGNOSIS — Z992 Dependence on renal dialysis: Secondary | ICD-10-CM | POA: Diagnosis not present

## 2022-11-24 DIAGNOSIS — N2581 Secondary hyperparathyroidism of renal origin: Secondary | ICD-10-CM | POA: Diagnosis not present

## 2022-11-24 DIAGNOSIS — N186 End stage renal disease: Secondary | ICD-10-CM | POA: Diagnosis not present

## 2022-11-24 DIAGNOSIS — Z4932 Encounter for adequacy testing for peritoneal dialysis: Secondary | ICD-10-CM | POA: Diagnosis not present

## 2022-11-24 NOTE — Telephone Encounter (Signed)
Spoke with the patient, explained Dr. Mayford Knife is asking the name of the weight loss medication. Pt asked if Dr.turner can call [facility], advised pt MD will not write the letter until he can provide the name of the medication. Pt voiced understanding.

## 2022-11-25 DIAGNOSIS — N2581 Secondary hyperparathyroidism of renal origin: Secondary | ICD-10-CM | POA: Diagnosis not present

## 2022-11-25 DIAGNOSIS — Z4932 Encounter for adequacy testing for peritoneal dialysis: Secondary | ICD-10-CM | POA: Diagnosis not present

## 2022-11-25 DIAGNOSIS — Z992 Dependence on renal dialysis: Secondary | ICD-10-CM | POA: Diagnosis not present

## 2022-11-25 DIAGNOSIS — N186 End stage renal disease: Secondary | ICD-10-CM | POA: Diagnosis not present

## 2022-11-25 DIAGNOSIS — D631 Anemia in chronic kidney disease: Secondary | ICD-10-CM | POA: Diagnosis not present

## 2022-11-26 DIAGNOSIS — N186 End stage renal disease: Secondary | ICD-10-CM | POA: Diagnosis not present

## 2022-11-26 DIAGNOSIS — N2581 Secondary hyperparathyroidism of renal origin: Secondary | ICD-10-CM | POA: Diagnosis not present

## 2022-11-26 DIAGNOSIS — D631 Anemia in chronic kidney disease: Secondary | ICD-10-CM | POA: Diagnosis not present

## 2022-11-26 DIAGNOSIS — Z4932 Encounter for adequacy testing for peritoneal dialysis: Secondary | ICD-10-CM | POA: Diagnosis not present

## 2022-11-26 DIAGNOSIS — Z992 Dependence on renal dialysis: Secondary | ICD-10-CM | POA: Diagnosis not present

## 2022-11-27 DIAGNOSIS — N2581 Secondary hyperparathyroidism of renal origin: Secondary | ICD-10-CM | POA: Diagnosis not present

## 2022-11-27 DIAGNOSIS — Z992 Dependence on renal dialysis: Secondary | ICD-10-CM | POA: Diagnosis not present

## 2022-11-27 DIAGNOSIS — D631 Anemia in chronic kidney disease: Secondary | ICD-10-CM | POA: Diagnosis not present

## 2022-11-27 DIAGNOSIS — N186 End stage renal disease: Secondary | ICD-10-CM | POA: Diagnosis not present

## 2022-11-27 DIAGNOSIS — Z4932 Encounter for adequacy testing for peritoneal dialysis: Secondary | ICD-10-CM | POA: Diagnosis not present

## 2022-11-28 DIAGNOSIS — Z4932 Encounter for adequacy testing for peritoneal dialysis: Secondary | ICD-10-CM | POA: Diagnosis not present

## 2022-11-28 DIAGNOSIS — Z992 Dependence on renal dialysis: Secondary | ICD-10-CM | POA: Diagnosis not present

## 2022-11-28 DIAGNOSIS — N186 End stage renal disease: Secondary | ICD-10-CM | POA: Diagnosis not present

## 2022-11-28 DIAGNOSIS — D631 Anemia in chronic kidney disease: Secondary | ICD-10-CM | POA: Diagnosis not present

## 2022-11-28 DIAGNOSIS — N2581 Secondary hyperparathyroidism of renal origin: Secondary | ICD-10-CM | POA: Diagnosis not present

## 2022-11-29 DIAGNOSIS — Z992 Dependence on renal dialysis: Secondary | ICD-10-CM | POA: Diagnosis not present

## 2022-11-29 DIAGNOSIS — N186 End stage renal disease: Secondary | ICD-10-CM | POA: Diagnosis not present

## 2022-11-29 DIAGNOSIS — D631 Anemia in chronic kidney disease: Secondary | ICD-10-CM | POA: Diagnosis not present

## 2022-11-29 DIAGNOSIS — Z4932 Encounter for adequacy testing for peritoneal dialysis: Secondary | ICD-10-CM | POA: Diagnosis not present

## 2022-11-29 DIAGNOSIS — N2581 Secondary hyperparathyroidism of renal origin: Secondary | ICD-10-CM | POA: Diagnosis not present

## 2022-11-30 DIAGNOSIS — N186 End stage renal disease: Secondary | ICD-10-CM | POA: Diagnosis not present

## 2022-11-30 DIAGNOSIS — N2581 Secondary hyperparathyroidism of renal origin: Secondary | ICD-10-CM | POA: Diagnosis not present

## 2022-11-30 DIAGNOSIS — D631 Anemia in chronic kidney disease: Secondary | ICD-10-CM | POA: Diagnosis not present

## 2022-11-30 DIAGNOSIS — Z992 Dependence on renal dialysis: Secondary | ICD-10-CM | POA: Diagnosis not present

## 2022-11-30 DIAGNOSIS — Z4932 Encounter for adequacy testing for peritoneal dialysis: Secondary | ICD-10-CM | POA: Diagnosis not present

## 2022-12-01 DIAGNOSIS — D631 Anemia in chronic kidney disease: Secondary | ICD-10-CM | POA: Diagnosis not present

## 2022-12-01 DIAGNOSIS — N186 End stage renal disease: Secondary | ICD-10-CM | POA: Diagnosis not present

## 2022-12-01 DIAGNOSIS — Z4932 Encounter for adequacy testing for peritoneal dialysis: Secondary | ICD-10-CM | POA: Diagnosis not present

## 2022-12-01 DIAGNOSIS — Z992 Dependence on renal dialysis: Secondary | ICD-10-CM | POA: Diagnosis not present

## 2022-12-01 DIAGNOSIS — N2581 Secondary hyperparathyroidism of renal origin: Secondary | ICD-10-CM | POA: Diagnosis not present

## 2022-12-02 DIAGNOSIS — Z992 Dependence on renal dialysis: Secondary | ICD-10-CM | POA: Diagnosis not present

## 2022-12-02 DIAGNOSIS — Z4932 Encounter for adequacy testing for peritoneal dialysis: Secondary | ICD-10-CM | POA: Diagnosis not present

## 2022-12-02 DIAGNOSIS — N2581 Secondary hyperparathyroidism of renal origin: Secondary | ICD-10-CM | POA: Diagnosis not present

## 2022-12-02 DIAGNOSIS — D631 Anemia in chronic kidney disease: Secondary | ICD-10-CM | POA: Diagnosis not present

## 2022-12-02 DIAGNOSIS — N186 End stage renal disease: Secondary | ICD-10-CM | POA: Diagnosis not present

## 2022-12-03 DIAGNOSIS — Z4932 Encounter for adequacy testing for peritoneal dialysis: Secondary | ICD-10-CM | POA: Diagnosis not present

## 2022-12-03 DIAGNOSIS — N186 End stage renal disease: Secondary | ICD-10-CM | POA: Diagnosis not present

## 2022-12-03 DIAGNOSIS — Z992 Dependence on renal dialysis: Secondary | ICD-10-CM | POA: Diagnosis not present

## 2022-12-03 DIAGNOSIS — D631 Anemia in chronic kidney disease: Secondary | ICD-10-CM | POA: Diagnosis not present

## 2022-12-03 DIAGNOSIS — N2581 Secondary hyperparathyroidism of renal origin: Secondary | ICD-10-CM | POA: Diagnosis not present

## 2022-12-04 DIAGNOSIS — N186 End stage renal disease: Secondary | ICD-10-CM | POA: Diagnosis not present

## 2022-12-04 DIAGNOSIS — D631 Anemia in chronic kidney disease: Secondary | ICD-10-CM | POA: Diagnosis not present

## 2022-12-04 DIAGNOSIS — Z4932 Encounter for adequacy testing for peritoneal dialysis: Secondary | ICD-10-CM | POA: Diagnosis not present

## 2022-12-04 DIAGNOSIS — Z992 Dependence on renal dialysis: Secondary | ICD-10-CM | POA: Diagnosis not present

## 2022-12-04 DIAGNOSIS — N2581 Secondary hyperparathyroidism of renal origin: Secondary | ICD-10-CM | POA: Diagnosis not present

## 2022-12-05 DIAGNOSIS — Z992 Dependence on renal dialysis: Secondary | ICD-10-CM | POA: Diagnosis not present

## 2022-12-05 DIAGNOSIS — N2581 Secondary hyperparathyroidism of renal origin: Secondary | ICD-10-CM | POA: Diagnosis not present

## 2022-12-05 DIAGNOSIS — D631 Anemia in chronic kidney disease: Secondary | ICD-10-CM | POA: Diagnosis not present

## 2022-12-05 DIAGNOSIS — Z4932 Encounter for adequacy testing for peritoneal dialysis: Secondary | ICD-10-CM | POA: Diagnosis not present

## 2022-12-05 DIAGNOSIS — N186 End stage renal disease: Secondary | ICD-10-CM | POA: Diagnosis not present

## 2022-12-06 DIAGNOSIS — Z992 Dependence on renal dialysis: Secondary | ICD-10-CM | POA: Diagnosis not present

## 2022-12-06 DIAGNOSIS — N2581 Secondary hyperparathyroidism of renal origin: Secondary | ICD-10-CM | POA: Diagnosis not present

## 2022-12-06 DIAGNOSIS — D631 Anemia in chronic kidney disease: Secondary | ICD-10-CM | POA: Diagnosis not present

## 2022-12-06 DIAGNOSIS — N186 End stage renal disease: Secondary | ICD-10-CM | POA: Diagnosis not present

## 2022-12-06 DIAGNOSIS — Z4932 Encounter for adequacy testing for peritoneal dialysis: Secondary | ICD-10-CM | POA: Diagnosis not present

## 2022-12-07 DIAGNOSIS — Z4932 Encounter for adequacy testing for peritoneal dialysis: Secondary | ICD-10-CM | POA: Diagnosis not present

## 2022-12-07 DIAGNOSIS — N2581 Secondary hyperparathyroidism of renal origin: Secondary | ICD-10-CM | POA: Diagnosis not present

## 2022-12-07 DIAGNOSIS — D631 Anemia in chronic kidney disease: Secondary | ICD-10-CM | POA: Diagnosis not present

## 2022-12-07 DIAGNOSIS — Z992 Dependence on renal dialysis: Secondary | ICD-10-CM | POA: Diagnosis not present

## 2022-12-07 DIAGNOSIS — N186 End stage renal disease: Secondary | ICD-10-CM | POA: Diagnosis not present

## 2022-12-08 ENCOUNTER — Other Ambulatory Visit: Payer: Self-pay | Admitting: Cardiology

## 2022-12-08 DIAGNOSIS — Z992 Dependence on renal dialysis: Secondary | ICD-10-CM | POA: Diagnosis not present

## 2022-12-08 DIAGNOSIS — Z4932 Encounter for adequacy testing for peritoneal dialysis: Secondary | ICD-10-CM | POA: Diagnosis not present

## 2022-12-08 DIAGNOSIS — N2581 Secondary hyperparathyroidism of renal origin: Secondary | ICD-10-CM | POA: Diagnosis not present

## 2022-12-08 DIAGNOSIS — N186 End stage renal disease: Secondary | ICD-10-CM | POA: Diagnosis not present

## 2022-12-08 DIAGNOSIS — D631 Anemia in chronic kidney disease: Secondary | ICD-10-CM | POA: Diagnosis not present

## 2022-12-09 DIAGNOSIS — N186 End stage renal disease: Secondary | ICD-10-CM | POA: Diagnosis not present

## 2022-12-09 DIAGNOSIS — D631 Anemia in chronic kidney disease: Secondary | ICD-10-CM | POA: Diagnosis not present

## 2022-12-09 DIAGNOSIS — N2581 Secondary hyperparathyroidism of renal origin: Secondary | ICD-10-CM | POA: Diagnosis not present

## 2022-12-09 DIAGNOSIS — Z992 Dependence on renal dialysis: Secondary | ICD-10-CM | POA: Diagnosis not present

## 2022-12-09 DIAGNOSIS — Z4932 Encounter for adequacy testing for peritoneal dialysis: Secondary | ICD-10-CM | POA: Diagnosis not present

## 2022-12-10 DIAGNOSIS — Z992 Dependence on renal dialysis: Secondary | ICD-10-CM | POA: Diagnosis not present

## 2022-12-10 DIAGNOSIS — Z4932 Encounter for adequacy testing for peritoneal dialysis: Secondary | ICD-10-CM | POA: Diagnosis not present

## 2022-12-10 DIAGNOSIS — N186 End stage renal disease: Secondary | ICD-10-CM | POA: Diagnosis not present

## 2022-12-10 DIAGNOSIS — N2581 Secondary hyperparathyroidism of renal origin: Secondary | ICD-10-CM | POA: Diagnosis not present

## 2022-12-10 DIAGNOSIS — D631 Anemia in chronic kidney disease: Secondary | ICD-10-CM | POA: Diagnosis not present

## 2022-12-11 DIAGNOSIS — Z4932 Encounter for adequacy testing for peritoneal dialysis: Secondary | ICD-10-CM | POA: Diagnosis not present

## 2022-12-11 DIAGNOSIS — Z992 Dependence on renal dialysis: Secondary | ICD-10-CM | POA: Diagnosis not present

## 2022-12-11 DIAGNOSIS — D631 Anemia in chronic kidney disease: Secondary | ICD-10-CM | POA: Diagnosis not present

## 2022-12-11 DIAGNOSIS — N2581 Secondary hyperparathyroidism of renal origin: Secondary | ICD-10-CM | POA: Diagnosis not present

## 2022-12-11 DIAGNOSIS — N186 End stage renal disease: Secondary | ICD-10-CM | POA: Diagnosis not present

## 2022-12-12 DIAGNOSIS — N186 End stage renal disease: Secondary | ICD-10-CM | POA: Diagnosis not present

## 2022-12-12 DIAGNOSIS — Z992 Dependence on renal dialysis: Secondary | ICD-10-CM | POA: Diagnosis not present

## 2022-12-12 DIAGNOSIS — N2581 Secondary hyperparathyroidism of renal origin: Secondary | ICD-10-CM | POA: Diagnosis not present

## 2022-12-12 DIAGNOSIS — D631 Anemia in chronic kidney disease: Secondary | ICD-10-CM | POA: Diagnosis not present

## 2022-12-12 DIAGNOSIS — Z4932 Encounter for adequacy testing for peritoneal dialysis: Secondary | ICD-10-CM | POA: Diagnosis not present

## 2022-12-13 DIAGNOSIS — D631 Anemia in chronic kidney disease: Secondary | ICD-10-CM | POA: Diagnosis not present

## 2022-12-13 DIAGNOSIS — N2581 Secondary hyperparathyroidism of renal origin: Secondary | ICD-10-CM | POA: Diagnosis not present

## 2022-12-13 DIAGNOSIS — N186 End stage renal disease: Secondary | ICD-10-CM | POA: Diagnosis not present

## 2022-12-13 DIAGNOSIS — Z992 Dependence on renal dialysis: Secondary | ICD-10-CM | POA: Diagnosis not present

## 2022-12-13 DIAGNOSIS — Z4932 Encounter for adequacy testing for peritoneal dialysis: Secondary | ICD-10-CM | POA: Diagnosis not present

## 2022-12-14 DIAGNOSIS — N186 End stage renal disease: Secondary | ICD-10-CM | POA: Diagnosis not present

## 2022-12-14 DIAGNOSIS — D631 Anemia in chronic kidney disease: Secondary | ICD-10-CM | POA: Diagnosis not present

## 2022-12-14 DIAGNOSIS — Z992 Dependence on renal dialysis: Secondary | ICD-10-CM | POA: Diagnosis not present

## 2022-12-14 DIAGNOSIS — E1122 Type 2 diabetes mellitus with diabetic chronic kidney disease: Secondary | ICD-10-CM | POA: Diagnosis not present

## 2022-12-14 DIAGNOSIS — Z4932 Encounter for adequacy testing for peritoneal dialysis: Secondary | ICD-10-CM | POA: Diagnosis not present

## 2022-12-14 DIAGNOSIS — N2581 Secondary hyperparathyroidism of renal origin: Secondary | ICD-10-CM | POA: Diagnosis not present

## 2022-12-15 DIAGNOSIS — N186 End stage renal disease: Secondary | ICD-10-CM | POA: Diagnosis not present

## 2022-12-15 DIAGNOSIS — D631 Anemia in chronic kidney disease: Secondary | ICD-10-CM | POA: Diagnosis not present

## 2022-12-15 DIAGNOSIS — Z992 Dependence on renal dialysis: Secondary | ICD-10-CM | POA: Diagnosis not present

## 2022-12-15 DIAGNOSIS — N2581 Secondary hyperparathyroidism of renal origin: Secondary | ICD-10-CM | POA: Diagnosis not present

## 2022-12-16 DIAGNOSIS — D631 Anemia in chronic kidney disease: Secondary | ICD-10-CM | POA: Diagnosis not present

## 2022-12-16 DIAGNOSIS — N186 End stage renal disease: Secondary | ICD-10-CM | POA: Diagnosis not present

## 2022-12-16 DIAGNOSIS — Z992 Dependence on renal dialysis: Secondary | ICD-10-CM | POA: Diagnosis not present

## 2022-12-16 DIAGNOSIS — N2581 Secondary hyperparathyroidism of renal origin: Secondary | ICD-10-CM | POA: Diagnosis not present

## 2022-12-17 DIAGNOSIS — D631 Anemia in chronic kidney disease: Secondary | ICD-10-CM | POA: Diagnosis not present

## 2022-12-17 DIAGNOSIS — Z992 Dependence on renal dialysis: Secondary | ICD-10-CM | POA: Diagnosis not present

## 2022-12-17 DIAGNOSIS — N186 End stage renal disease: Secondary | ICD-10-CM | POA: Diagnosis not present

## 2022-12-17 DIAGNOSIS — N2581 Secondary hyperparathyroidism of renal origin: Secondary | ICD-10-CM | POA: Diagnosis not present

## 2022-12-18 DIAGNOSIS — N2581 Secondary hyperparathyroidism of renal origin: Secondary | ICD-10-CM | POA: Diagnosis not present

## 2022-12-18 DIAGNOSIS — N186 End stage renal disease: Secondary | ICD-10-CM | POA: Diagnosis not present

## 2022-12-18 DIAGNOSIS — D631 Anemia in chronic kidney disease: Secondary | ICD-10-CM | POA: Diagnosis not present

## 2022-12-18 DIAGNOSIS — Z992 Dependence on renal dialysis: Secondary | ICD-10-CM | POA: Diagnosis not present

## 2022-12-19 DIAGNOSIS — D631 Anemia in chronic kidney disease: Secondary | ICD-10-CM | POA: Diagnosis not present

## 2022-12-19 DIAGNOSIS — N186 End stage renal disease: Secondary | ICD-10-CM | POA: Diagnosis not present

## 2022-12-19 DIAGNOSIS — Z992 Dependence on renal dialysis: Secondary | ICD-10-CM | POA: Diagnosis not present

## 2022-12-19 DIAGNOSIS — N2581 Secondary hyperparathyroidism of renal origin: Secondary | ICD-10-CM | POA: Diagnosis not present

## 2022-12-20 DIAGNOSIS — Z992 Dependence on renal dialysis: Secondary | ICD-10-CM | POA: Diagnosis not present

## 2022-12-20 DIAGNOSIS — D631 Anemia in chronic kidney disease: Secondary | ICD-10-CM | POA: Diagnosis not present

## 2022-12-20 DIAGNOSIS — N2581 Secondary hyperparathyroidism of renal origin: Secondary | ICD-10-CM | POA: Diagnosis not present

## 2022-12-20 DIAGNOSIS — N186 End stage renal disease: Secondary | ICD-10-CM | POA: Diagnosis not present

## 2022-12-21 DIAGNOSIS — Z992 Dependence on renal dialysis: Secondary | ICD-10-CM | POA: Diagnosis not present

## 2022-12-21 DIAGNOSIS — N2581 Secondary hyperparathyroidism of renal origin: Secondary | ICD-10-CM | POA: Diagnosis not present

## 2022-12-21 DIAGNOSIS — N186 End stage renal disease: Secondary | ICD-10-CM | POA: Diagnosis not present

## 2022-12-21 DIAGNOSIS — D631 Anemia in chronic kidney disease: Secondary | ICD-10-CM | POA: Diagnosis not present

## 2022-12-22 DIAGNOSIS — N186 End stage renal disease: Secondary | ICD-10-CM | POA: Diagnosis not present

## 2022-12-22 DIAGNOSIS — N2581 Secondary hyperparathyroidism of renal origin: Secondary | ICD-10-CM | POA: Diagnosis not present

## 2022-12-22 DIAGNOSIS — D631 Anemia in chronic kidney disease: Secondary | ICD-10-CM | POA: Diagnosis not present

## 2022-12-22 DIAGNOSIS — Z992 Dependence on renal dialysis: Secondary | ICD-10-CM | POA: Diagnosis not present

## 2022-12-23 DIAGNOSIS — D631 Anemia in chronic kidney disease: Secondary | ICD-10-CM | POA: Diagnosis not present

## 2022-12-23 DIAGNOSIS — Z992 Dependence on renal dialysis: Secondary | ICD-10-CM | POA: Diagnosis not present

## 2022-12-23 DIAGNOSIS — N186 End stage renal disease: Secondary | ICD-10-CM | POA: Diagnosis not present

## 2022-12-23 DIAGNOSIS — N2581 Secondary hyperparathyroidism of renal origin: Secondary | ICD-10-CM | POA: Diagnosis not present

## 2022-12-24 DIAGNOSIS — N186 End stage renal disease: Secondary | ICD-10-CM | POA: Diagnosis not present

## 2022-12-24 DIAGNOSIS — D631 Anemia in chronic kidney disease: Secondary | ICD-10-CM | POA: Diagnosis not present

## 2022-12-24 DIAGNOSIS — N2581 Secondary hyperparathyroidism of renal origin: Secondary | ICD-10-CM | POA: Diagnosis not present

## 2022-12-24 DIAGNOSIS — Z992 Dependence on renal dialysis: Secondary | ICD-10-CM | POA: Diagnosis not present

## 2022-12-25 DIAGNOSIS — N2581 Secondary hyperparathyroidism of renal origin: Secondary | ICD-10-CM | POA: Diagnosis not present

## 2022-12-25 DIAGNOSIS — N186 End stage renal disease: Secondary | ICD-10-CM | POA: Diagnosis not present

## 2022-12-25 DIAGNOSIS — Z992 Dependence on renal dialysis: Secondary | ICD-10-CM | POA: Diagnosis not present

## 2022-12-25 DIAGNOSIS — D631 Anemia in chronic kidney disease: Secondary | ICD-10-CM | POA: Diagnosis not present

## 2022-12-26 DIAGNOSIS — Z992 Dependence on renal dialysis: Secondary | ICD-10-CM | POA: Diagnosis not present

## 2022-12-26 DIAGNOSIS — N186 End stage renal disease: Secondary | ICD-10-CM | POA: Diagnosis not present

## 2022-12-26 DIAGNOSIS — D631 Anemia in chronic kidney disease: Secondary | ICD-10-CM | POA: Diagnosis not present

## 2022-12-26 DIAGNOSIS — N2581 Secondary hyperparathyroidism of renal origin: Secondary | ICD-10-CM | POA: Diagnosis not present

## 2022-12-27 DIAGNOSIS — Z992 Dependence on renal dialysis: Secondary | ICD-10-CM | POA: Diagnosis not present

## 2022-12-27 DIAGNOSIS — N186 End stage renal disease: Secondary | ICD-10-CM | POA: Diagnosis not present

## 2022-12-27 DIAGNOSIS — N2581 Secondary hyperparathyroidism of renal origin: Secondary | ICD-10-CM | POA: Diagnosis not present

## 2022-12-27 DIAGNOSIS — D631 Anemia in chronic kidney disease: Secondary | ICD-10-CM | POA: Diagnosis not present

## 2022-12-28 DIAGNOSIS — N2581 Secondary hyperparathyroidism of renal origin: Secondary | ICD-10-CM | POA: Diagnosis not present

## 2022-12-28 DIAGNOSIS — D631 Anemia in chronic kidney disease: Secondary | ICD-10-CM | POA: Diagnosis not present

## 2022-12-28 DIAGNOSIS — N186 End stage renal disease: Secondary | ICD-10-CM | POA: Diagnosis not present

## 2022-12-28 DIAGNOSIS — Z992 Dependence on renal dialysis: Secondary | ICD-10-CM | POA: Diagnosis not present

## 2022-12-29 DIAGNOSIS — N186 End stage renal disease: Secondary | ICD-10-CM | POA: Diagnosis not present

## 2022-12-29 DIAGNOSIS — Z992 Dependence on renal dialysis: Secondary | ICD-10-CM | POA: Diagnosis not present

## 2022-12-29 DIAGNOSIS — N2581 Secondary hyperparathyroidism of renal origin: Secondary | ICD-10-CM | POA: Diagnosis not present

## 2022-12-29 DIAGNOSIS — D631 Anemia in chronic kidney disease: Secondary | ICD-10-CM | POA: Diagnosis not present

## 2022-12-30 DIAGNOSIS — Z992 Dependence on renal dialysis: Secondary | ICD-10-CM | POA: Diagnosis not present

## 2022-12-30 DIAGNOSIS — D631 Anemia in chronic kidney disease: Secondary | ICD-10-CM | POA: Diagnosis not present

## 2022-12-30 DIAGNOSIS — N186 End stage renal disease: Secondary | ICD-10-CM | POA: Diagnosis not present

## 2022-12-30 DIAGNOSIS — N2581 Secondary hyperparathyroidism of renal origin: Secondary | ICD-10-CM | POA: Diagnosis not present

## 2022-12-31 DIAGNOSIS — N2581 Secondary hyperparathyroidism of renal origin: Secondary | ICD-10-CM | POA: Diagnosis not present

## 2022-12-31 DIAGNOSIS — N186 End stage renal disease: Secondary | ICD-10-CM | POA: Diagnosis not present

## 2022-12-31 DIAGNOSIS — Z992 Dependence on renal dialysis: Secondary | ICD-10-CM | POA: Diagnosis not present

## 2022-12-31 DIAGNOSIS — D631 Anemia in chronic kidney disease: Secondary | ICD-10-CM | POA: Diagnosis not present

## 2023-01-01 DIAGNOSIS — D631 Anemia in chronic kidney disease: Secondary | ICD-10-CM | POA: Diagnosis not present

## 2023-01-01 DIAGNOSIS — N186 End stage renal disease: Secondary | ICD-10-CM | POA: Diagnosis not present

## 2023-01-01 DIAGNOSIS — Z992 Dependence on renal dialysis: Secondary | ICD-10-CM | POA: Diagnosis not present

## 2023-01-01 DIAGNOSIS — N2581 Secondary hyperparathyroidism of renal origin: Secondary | ICD-10-CM | POA: Diagnosis not present

## 2023-01-02 DIAGNOSIS — N186 End stage renal disease: Secondary | ICD-10-CM | POA: Diagnosis not present

## 2023-01-02 DIAGNOSIS — Z992 Dependence on renal dialysis: Secondary | ICD-10-CM | POA: Diagnosis not present

## 2023-01-02 DIAGNOSIS — N2581 Secondary hyperparathyroidism of renal origin: Secondary | ICD-10-CM | POA: Diagnosis not present

## 2023-01-02 DIAGNOSIS — D631 Anemia in chronic kidney disease: Secondary | ICD-10-CM | POA: Diagnosis not present

## 2023-01-03 DIAGNOSIS — Z992 Dependence on renal dialysis: Secondary | ICD-10-CM | POA: Diagnosis not present

## 2023-01-03 DIAGNOSIS — N2581 Secondary hyperparathyroidism of renal origin: Secondary | ICD-10-CM | POA: Diagnosis not present

## 2023-01-03 DIAGNOSIS — N186 End stage renal disease: Secondary | ICD-10-CM | POA: Diagnosis not present

## 2023-01-03 DIAGNOSIS — D631 Anemia in chronic kidney disease: Secondary | ICD-10-CM | POA: Diagnosis not present

## 2023-01-04 DIAGNOSIS — D631 Anemia in chronic kidney disease: Secondary | ICD-10-CM | POA: Diagnosis not present

## 2023-01-04 DIAGNOSIS — N2581 Secondary hyperparathyroidism of renal origin: Secondary | ICD-10-CM | POA: Diagnosis not present

## 2023-01-04 DIAGNOSIS — N186 End stage renal disease: Secondary | ICD-10-CM | POA: Diagnosis not present

## 2023-01-04 DIAGNOSIS — Z992 Dependence on renal dialysis: Secondary | ICD-10-CM | POA: Diagnosis not present

## 2023-01-05 DIAGNOSIS — N2581 Secondary hyperparathyroidism of renal origin: Secondary | ICD-10-CM | POA: Diagnosis not present

## 2023-01-05 DIAGNOSIS — Z992 Dependence on renal dialysis: Secondary | ICD-10-CM | POA: Diagnosis not present

## 2023-01-05 DIAGNOSIS — N186 End stage renal disease: Secondary | ICD-10-CM | POA: Diagnosis not present

## 2023-01-05 DIAGNOSIS — D631 Anemia in chronic kidney disease: Secondary | ICD-10-CM | POA: Diagnosis not present

## 2023-01-05 DIAGNOSIS — E1122 Type 2 diabetes mellitus with diabetic chronic kidney disease: Secondary | ICD-10-CM | POA: Diagnosis not present

## 2023-01-06 DIAGNOSIS — N2581 Secondary hyperparathyroidism of renal origin: Secondary | ICD-10-CM | POA: Diagnosis not present

## 2023-01-06 DIAGNOSIS — Z992 Dependence on renal dialysis: Secondary | ICD-10-CM | POA: Diagnosis not present

## 2023-01-06 DIAGNOSIS — D631 Anemia in chronic kidney disease: Secondary | ICD-10-CM | POA: Diagnosis not present

## 2023-01-06 DIAGNOSIS — N186 End stage renal disease: Secondary | ICD-10-CM | POA: Diagnosis not present

## 2023-01-07 DIAGNOSIS — N186 End stage renal disease: Secondary | ICD-10-CM | POA: Diagnosis not present

## 2023-01-07 DIAGNOSIS — N2581 Secondary hyperparathyroidism of renal origin: Secondary | ICD-10-CM | POA: Diagnosis not present

## 2023-01-07 DIAGNOSIS — D631 Anemia in chronic kidney disease: Secondary | ICD-10-CM | POA: Diagnosis not present

## 2023-01-07 DIAGNOSIS — Z992 Dependence on renal dialysis: Secondary | ICD-10-CM | POA: Diagnosis not present

## 2023-01-08 DIAGNOSIS — D631 Anemia in chronic kidney disease: Secondary | ICD-10-CM | POA: Diagnosis not present

## 2023-01-08 DIAGNOSIS — N186 End stage renal disease: Secondary | ICD-10-CM | POA: Diagnosis not present

## 2023-01-08 DIAGNOSIS — Z992 Dependence on renal dialysis: Secondary | ICD-10-CM | POA: Diagnosis not present

## 2023-01-08 DIAGNOSIS — N2581 Secondary hyperparathyroidism of renal origin: Secondary | ICD-10-CM | POA: Diagnosis not present

## 2023-01-09 DIAGNOSIS — N2581 Secondary hyperparathyroidism of renal origin: Secondary | ICD-10-CM | POA: Diagnosis not present

## 2023-01-09 DIAGNOSIS — Z992 Dependence on renal dialysis: Secondary | ICD-10-CM | POA: Diagnosis not present

## 2023-01-09 DIAGNOSIS — N186 End stage renal disease: Secondary | ICD-10-CM | POA: Diagnosis not present

## 2023-01-09 DIAGNOSIS — D631 Anemia in chronic kidney disease: Secondary | ICD-10-CM | POA: Diagnosis not present

## 2023-01-10 DIAGNOSIS — N186 End stage renal disease: Secondary | ICD-10-CM | POA: Diagnosis not present

## 2023-01-10 DIAGNOSIS — D631 Anemia in chronic kidney disease: Secondary | ICD-10-CM | POA: Diagnosis not present

## 2023-01-10 DIAGNOSIS — N2581 Secondary hyperparathyroidism of renal origin: Secondary | ICD-10-CM | POA: Diagnosis not present

## 2023-01-10 DIAGNOSIS — Z992 Dependence on renal dialysis: Secondary | ICD-10-CM | POA: Diagnosis not present

## 2023-01-11 DIAGNOSIS — D631 Anemia in chronic kidney disease: Secondary | ICD-10-CM | POA: Diagnosis not present

## 2023-01-11 DIAGNOSIS — Z992 Dependence on renal dialysis: Secondary | ICD-10-CM | POA: Diagnosis not present

## 2023-01-11 DIAGNOSIS — N2581 Secondary hyperparathyroidism of renal origin: Secondary | ICD-10-CM | POA: Diagnosis not present

## 2023-01-11 DIAGNOSIS — N186 End stage renal disease: Secondary | ICD-10-CM | POA: Diagnosis not present

## 2023-01-12 DIAGNOSIS — D631 Anemia in chronic kidney disease: Secondary | ICD-10-CM | POA: Diagnosis not present

## 2023-01-12 DIAGNOSIS — N2581 Secondary hyperparathyroidism of renal origin: Secondary | ICD-10-CM | POA: Diagnosis not present

## 2023-01-12 DIAGNOSIS — N186 End stage renal disease: Secondary | ICD-10-CM | POA: Diagnosis not present

## 2023-01-12 DIAGNOSIS — Z992 Dependence on renal dialysis: Secondary | ICD-10-CM | POA: Diagnosis not present

## 2023-01-13 DIAGNOSIS — N186 End stage renal disease: Secondary | ICD-10-CM | POA: Diagnosis not present

## 2023-01-13 DIAGNOSIS — Z992 Dependence on renal dialysis: Secondary | ICD-10-CM | POA: Diagnosis not present

## 2023-01-13 DIAGNOSIS — D631 Anemia in chronic kidney disease: Secondary | ICD-10-CM | POA: Diagnosis not present

## 2023-01-13 DIAGNOSIS — N2581 Secondary hyperparathyroidism of renal origin: Secondary | ICD-10-CM | POA: Diagnosis not present

## 2023-01-14 DIAGNOSIS — N186 End stage renal disease: Secondary | ICD-10-CM | POA: Diagnosis not present

## 2023-01-14 DIAGNOSIS — E1122 Type 2 diabetes mellitus with diabetic chronic kidney disease: Secondary | ICD-10-CM | POA: Diagnosis not present

## 2023-01-14 DIAGNOSIS — D631 Anemia in chronic kidney disease: Secondary | ICD-10-CM | POA: Diagnosis not present

## 2023-01-14 DIAGNOSIS — N2581 Secondary hyperparathyroidism of renal origin: Secondary | ICD-10-CM | POA: Diagnosis not present

## 2023-01-14 DIAGNOSIS — Z992 Dependence on renal dialysis: Secondary | ICD-10-CM | POA: Diagnosis not present

## 2023-01-15 DIAGNOSIS — D631 Anemia in chronic kidney disease: Secondary | ICD-10-CM | POA: Diagnosis not present

## 2023-01-15 DIAGNOSIS — N186 End stage renal disease: Secondary | ICD-10-CM | POA: Diagnosis not present

## 2023-01-15 DIAGNOSIS — N2581 Secondary hyperparathyroidism of renal origin: Secondary | ICD-10-CM | POA: Diagnosis not present

## 2023-01-15 DIAGNOSIS — Z992 Dependence on renal dialysis: Secondary | ICD-10-CM | POA: Diagnosis not present

## 2023-01-16 DIAGNOSIS — D631 Anemia in chronic kidney disease: Secondary | ICD-10-CM | POA: Diagnosis not present

## 2023-01-16 DIAGNOSIS — N186 End stage renal disease: Secondary | ICD-10-CM | POA: Diagnosis not present

## 2023-01-16 DIAGNOSIS — N2581 Secondary hyperparathyroidism of renal origin: Secondary | ICD-10-CM | POA: Diagnosis not present

## 2023-01-16 DIAGNOSIS — Z992 Dependence on renal dialysis: Secondary | ICD-10-CM | POA: Diagnosis not present

## 2023-01-17 DIAGNOSIS — Z992 Dependence on renal dialysis: Secondary | ICD-10-CM | POA: Diagnosis not present

## 2023-01-17 DIAGNOSIS — D631 Anemia in chronic kidney disease: Secondary | ICD-10-CM | POA: Diagnosis not present

## 2023-01-17 DIAGNOSIS — N186 End stage renal disease: Secondary | ICD-10-CM | POA: Diagnosis not present

## 2023-01-17 DIAGNOSIS — N2581 Secondary hyperparathyroidism of renal origin: Secondary | ICD-10-CM | POA: Diagnosis not present

## 2023-01-18 ENCOUNTER — Encounter (HOSPITAL_COMMUNITY): Payer: Self-pay

## 2023-01-18 DIAGNOSIS — D631 Anemia in chronic kidney disease: Secondary | ICD-10-CM | POA: Diagnosis not present

## 2023-01-18 DIAGNOSIS — N2581 Secondary hyperparathyroidism of renal origin: Secondary | ICD-10-CM | POA: Diagnosis not present

## 2023-01-18 DIAGNOSIS — Z992 Dependence on renal dialysis: Secondary | ICD-10-CM | POA: Diagnosis not present

## 2023-01-18 DIAGNOSIS — N186 End stage renal disease: Secondary | ICD-10-CM | POA: Diagnosis not present

## 2023-01-19 ENCOUNTER — Other Ambulatory Visit: Payer: Self-pay | Admitting: Cardiology

## 2023-01-19 ENCOUNTER — Ambulatory Visit (HOSPITAL_COMMUNITY)
Admission: RE | Admit: 2023-01-19 | Discharge: 2023-01-19 | Disposition: A | Discharge status: Home / Self Care | Payer: Medicare Other | Source: Ambulatory Visit | Attending: Cardiology | Admitting: Cardiology

## 2023-01-19 DIAGNOSIS — N2581 Secondary hyperparathyroidism of renal origin: Secondary | ICD-10-CM | POA: Diagnosis not present

## 2023-01-19 DIAGNOSIS — I371 Nonrheumatic pulmonary valve insufficiency: Secondary | ICD-10-CM | POA: Diagnosis not present

## 2023-01-19 DIAGNOSIS — Z992 Dependence on renal dialysis: Secondary | ICD-10-CM | POA: Diagnosis not present

## 2023-01-19 DIAGNOSIS — N186 End stage renal disease: Secondary | ICD-10-CM | POA: Diagnosis not present

## 2023-01-19 DIAGNOSIS — D631 Anemia in chronic kidney disease: Secondary | ICD-10-CM | POA: Diagnosis not present

## 2023-01-20 DIAGNOSIS — N186 End stage renal disease: Secondary | ICD-10-CM | POA: Diagnosis not present

## 2023-01-20 DIAGNOSIS — N2581 Secondary hyperparathyroidism of renal origin: Secondary | ICD-10-CM | POA: Diagnosis not present

## 2023-01-20 DIAGNOSIS — Z992 Dependence on renal dialysis: Secondary | ICD-10-CM | POA: Diagnosis not present

## 2023-01-20 DIAGNOSIS — D631 Anemia in chronic kidney disease: Secondary | ICD-10-CM | POA: Diagnosis not present

## 2023-01-21 DIAGNOSIS — D631 Anemia in chronic kidney disease: Secondary | ICD-10-CM | POA: Diagnosis not present

## 2023-01-21 DIAGNOSIS — I10 Essential (primary) hypertension: Secondary | ICD-10-CM | POA: Diagnosis not present

## 2023-01-21 DIAGNOSIS — Z992 Dependence on renal dialysis: Secondary | ICD-10-CM | POA: Diagnosis not present

## 2023-01-21 DIAGNOSIS — G4733 Obstructive sleep apnea (adult) (pediatric): Secondary | ICD-10-CM | POA: Diagnosis not present

## 2023-01-21 DIAGNOSIS — N186 End stage renal disease: Secondary | ICD-10-CM | POA: Diagnosis not present

## 2023-01-21 DIAGNOSIS — N2581 Secondary hyperparathyroidism of renal origin: Secondary | ICD-10-CM | POA: Diagnosis not present

## 2023-01-22 DIAGNOSIS — D631 Anemia in chronic kidney disease: Secondary | ICD-10-CM | POA: Diagnosis not present

## 2023-01-22 DIAGNOSIS — Z992 Dependence on renal dialysis: Secondary | ICD-10-CM | POA: Diagnosis not present

## 2023-01-22 DIAGNOSIS — N186 End stage renal disease: Secondary | ICD-10-CM | POA: Diagnosis not present

## 2023-01-22 DIAGNOSIS — N2581 Secondary hyperparathyroidism of renal origin: Secondary | ICD-10-CM | POA: Diagnosis not present

## 2023-01-23 DIAGNOSIS — Z992 Dependence on renal dialysis: Secondary | ICD-10-CM | POA: Diagnosis not present

## 2023-01-23 DIAGNOSIS — D631 Anemia in chronic kidney disease: Secondary | ICD-10-CM | POA: Diagnosis not present

## 2023-01-23 DIAGNOSIS — N2581 Secondary hyperparathyroidism of renal origin: Secondary | ICD-10-CM | POA: Diagnosis not present

## 2023-01-23 DIAGNOSIS — N186 End stage renal disease: Secondary | ICD-10-CM | POA: Diagnosis not present

## 2023-01-24 DIAGNOSIS — N2581 Secondary hyperparathyroidism of renal origin: Secondary | ICD-10-CM | POA: Diagnosis not present

## 2023-01-24 DIAGNOSIS — D631 Anemia in chronic kidney disease: Secondary | ICD-10-CM | POA: Diagnosis not present

## 2023-01-24 DIAGNOSIS — N186 End stage renal disease: Secondary | ICD-10-CM | POA: Diagnosis not present

## 2023-01-24 DIAGNOSIS — Z992 Dependence on renal dialysis: Secondary | ICD-10-CM | POA: Diagnosis not present

## 2023-01-25 DIAGNOSIS — Z992 Dependence on renal dialysis: Secondary | ICD-10-CM | POA: Diagnosis not present

## 2023-01-25 DIAGNOSIS — D631 Anemia in chronic kidney disease: Secondary | ICD-10-CM | POA: Diagnosis not present

## 2023-01-25 DIAGNOSIS — N186 End stage renal disease: Secondary | ICD-10-CM | POA: Diagnosis not present

## 2023-01-25 DIAGNOSIS — N2581 Secondary hyperparathyroidism of renal origin: Secondary | ICD-10-CM | POA: Diagnosis not present

## 2023-01-26 DIAGNOSIS — D631 Anemia in chronic kidney disease: Secondary | ICD-10-CM | POA: Diagnosis not present

## 2023-01-26 DIAGNOSIS — N2581 Secondary hyperparathyroidism of renal origin: Secondary | ICD-10-CM | POA: Diagnosis not present

## 2023-01-26 DIAGNOSIS — Z992 Dependence on renal dialysis: Secondary | ICD-10-CM | POA: Diagnosis not present

## 2023-01-26 DIAGNOSIS — N186 End stage renal disease: Secondary | ICD-10-CM | POA: Diagnosis not present

## 2023-01-27 DIAGNOSIS — D631 Anemia in chronic kidney disease: Secondary | ICD-10-CM | POA: Diagnosis not present

## 2023-01-27 DIAGNOSIS — N2581 Secondary hyperparathyroidism of renal origin: Secondary | ICD-10-CM | POA: Diagnosis not present

## 2023-01-27 DIAGNOSIS — Z992 Dependence on renal dialysis: Secondary | ICD-10-CM | POA: Diagnosis not present

## 2023-01-27 DIAGNOSIS — N186 End stage renal disease: Secondary | ICD-10-CM | POA: Diagnosis not present

## 2023-01-28 DIAGNOSIS — Z992 Dependence on renal dialysis: Secondary | ICD-10-CM | POA: Diagnosis not present

## 2023-01-28 DIAGNOSIS — N186 End stage renal disease: Secondary | ICD-10-CM | POA: Diagnosis not present

## 2023-01-28 DIAGNOSIS — D631 Anemia in chronic kidney disease: Secondary | ICD-10-CM | POA: Diagnosis not present

## 2023-01-28 DIAGNOSIS — N2581 Secondary hyperparathyroidism of renal origin: Secondary | ICD-10-CM | POA: Diagnosis not present

## 2023-01-29 DIAGNOSIS — N186 End stage renal disease: Secondary | ICD-10-CM | POA: Diagnosis not present

## 2023-01-29 DIAGNOSIS — N2581 Secondary hyperparathyroidism of renal origin: Secondary | ICD-10-CM | POA: Diagnosis not present

## 2023-01-29 DIAGNOSIS — D631 Anemia in chronic kidney disease: Secondary | ICD-10-CM | POA: Diagnosis not present

## 2023-01-29 DIAGNOSIS — Z992 Dependence on renal dialysis: Secondary | ICD-10-CM | POA: Diagnosis not present

## 2023-01-30 DIAGNOSIS — N2581 Secondary hyperparathyroidism of renal origin: Secondary | ICD-10-CM | POA: Diagnosis not present

## 2023-01-30 DIAGNOSIS — Z992 Dependence on renal dialysis: Secondary | ICD-10-CM | POA: Diagnosis not present

## 2023-01-30 DIAGNOSIS — D631 Anemia in chronic kidney disease: Secondary | ICD-10-CM | POA: Diagnosis not present

## 2023-01-30 DIAGNOSIS — N186 End stage renal disease: Secondary | ICD-10-CM | POA: Diagnosis not present

## 2023-01-31 ENCOUNTER — Encounter: Payer: Self-pay | Admitting: Cardiology

## 2023-01-31 DIAGNOSIS — D631 Anemia in chronic kidney disease: Secondary | ICD-10-CM | POA: Diagnosis not present

## 2023-01-31 DIAGNOSIS — N2581 Secondary hyperparathyroidism of renal origin: Secondary | ICD-10-CM | POA: Diagnosis not present

## 2023-01-31 DIAGNOSIS — N186 End stage renal disease: Secondary | ICD-10-CM | POA: Diagnosis not present

## 2023-01-31 DIAGNOSIS — Z992 Dependence on renal dialysis: Secondary | ICD-10-CM | POA: Diagnosis not present

## 2023-02-01 DIAGNOSIS — N186 End stage renal disease: Secondary | ICD-10-CM | POA: Diagnosis not present

## 2023-02-01 DIAGNOSIS — N2581 Secondary hyperparathyroidism of renal origin: Secondary | ICD-10-CM | POA: Diagnosis not present

## 2023-02-01 DIAGNOSIS — D631 Anemia in chronic kidney disease: Secondary | ICD-10-CM | POA: Diagnosis not present

## 2023-02-01 DIAGNOSIS — Z992 Dependence on renal dialysis: Secondary | ICD-10-CM | POA: Diagnosis not present

## 2023-02-02 DIAGNOSIS — D631 Anemia in chronic kidney disease: Secondary | ICD-10-CM | POA: Diagnosis not present

## 2023-02-02 DIAGNOSIS — N186 End stage renal disease: Secondary | ICD-10-CM | POA: Diagnosis not present

## 2023-02-02 DIAGNOSIS — Z992 Dependence on renal dialysis: Secondary | ICD-10-CM | POA: Diagnosis not present

## 2023-02-02 DIAGNOSIS — N2581 Secondary hyperparathyroidism of renal origin: Secondary | ICD-10-CM | POA: Diagnosis not present

## 2023-02-03 ENCOUNTER — Telehealth: Payer: Self-pay

## 2023-02-03 DIAGNOSIS — Z992 Dependence on renal dialysis: Secondary | ICD-10-CM | POA: Diagnosis not present

## 2023-02-03 DIAGNOSIS — N186 End stage renal disease: Secondary | ICD-10-CM | POA: Diagnosis not present

## 2023-02-03 DIAGNOSIS — N2581 Secondary hyperparathyroidism of renal origin: Secondary | ICD-10-CM | POA: Diagnosis not present

## 2023-02-03 DIAGNOSIS — D631 Anemia in chronic kidney disease: Secondary | ICD-10-CM | POA: Diagnosis not present

## 2023-02-03 NOTE — Telephone Encounter (Signed)
Call to patient to advise that referral was sent to [facility]. Patient verbalizes understanding and has no further questions at this time, states he spoke to Dr. Mayford Knife 2 days ago and she answered all his questions.

## 2023-02-04 DIAGNOSIS — D631 Anemia in chronic kidney disease: Secondary | ICD-10-CM | POA: Diagnosis not present

## 2023-02-04 DIAGNOSIS — N186 End stage renal disease: Secondary | ICD-10-CM | POA: Diagnosis not present

## 2023-02-04 DIAGNOSIS — N2581 Secondary hyperparathyroidism of renal origin: Secondary | ICD-10-CM | POA: Diagnosis not present

## 2023-02-04 DIAGNOSIS — Z992 Dependence on renal dialysis: Secondary | ICD-10-CM | POA: Diagnosis not present

## 2023-02-05 DIAGNOSIS — N2581 Secondary hyperparathyroidism of renal origin: Secondary | ICD-10-CM | POA: Diagnosis not present

## 2023-02-05 DIAGNOSIS — N186 End stage renal disease: Secondary | ICD-10-CM | POA: Diagnosis not present

## 2023-02-05 DIAGNOSIS — D631 Anemia in chronic kidney disease: Secondary | ICD-10-CM | POA: Diagnosis not present

## 2023-02-05 DIAGNOSIS — Z992 Dependence on renal dialysis: Secondary | ICD-10-CM | POA: Diagnosis not present

## 2023-02-06 DIAGNOSIS — Z992 Dependence on renal dialysis: Secondary | ICD-10-CM | POA: Diagnosis not present

## 2023-02-06 DIAGNOSIS — N186 End stage renal disease: Secondary | ICD-10-CM | POA: Diagnosis not present

## 2023-02-06 DIAGNOSIS — D631 Anemia in chronic kidney disease: Secondary | ICD-10-CM | POA: Diagnosis not present

## 2023-02-06 DIAGNOSIS — N2581 Secondary hyperparathyroidism of renal origin: Secondary | ICD-10-CM | POA: Diagnosis not present

## 2023-02-07 DIAGNOSIS — E1122 Type 2 diabetes mellitus with diabetic chronic kidney disease: Secondary | ICD-10-CM | POA: Diagnosis not present

## 2023-02-07 DIAGNOSIS — Z0000 Encounter for general adult medical examination without abnormal findings: Secondary | ICD-10-CM | POA: Diagnosis not present

## 2023-02-07 DIAGNOSIS — Z992 Dependence on renal dialysis: Secondary | ICD-10-CM | POA: Diagnosis not present

## 2023-02-07 DIAGNOSIS — D631 Anemia in chronic kidney disease: Secondary | ICD-10-CM | POA: Diagnosis not present

## 2023-02-07 DIAGNOSIS — N2581 Secondary hyperparathyroidism of renal origin: Secondary | ICD-10-CM | POA: Diagnosis not present

## 2023-02-07 DIAGNOSIS — N186 End stage renal disease: Secondary | ICD-10-CM | POA: Diagnosis not present

## 2023-02-07 DIAGNOSIS — E7800 Pure hypercholesterolemia, unspecified: Secondary | ICD-10-CM | POA: Diagnosis not present

## 2023-02-08 DIAGNOSIS — N2581 Secondary hyperparathyroidism of renal origin: Secondary | ICD-10-CM | POA: Diagnosis not present

## 2023-02-08 DIAGNOSIS — N186 End stage renal disease: Secondary | ICD-10-CM | POA: Diagnosis not present

## 2023-02-08 DIAGNOSIS — Z992 Dependence on renal dialysis: Secondary | ICD-10-CM | POA: Diagnosis not present

## 2023-02-08 DIAGNOSIS — D631 Anemia in chronic kidney disease: Secondary | ICD-10-CM | POA: Diagnosis not present

## 2023-02-09 DIAGNOSIS — Z992 Dependence on renal dialysis: Secondary | ICD-10-CM | POA: Diagnosis not present

## 2023-02-09 DIAGNOSIS — N2581 Secondary hyperparathyroidism of renal origin: Secondary | ICD-10-CM | POA: Diagnosis not present

## 2023-02-09 DIAGNOSIS — D631 Anemia in chronic kidney disease: Secondary | ICD-10-CM | POA: Diagnosis not present

## 2023-02-09 DIAGNOSIS — N186 End stage renal disease: Secondary | ICD-10-CM | POA: Diagnosis not present

## 2023-02-10 DIAGNOSIS — D631 Anemia in chronic kidney disease: Secondary | ICD-10-CM | POA: Diagnosis not present

## 2023-02-10 DIAGNOSIS — Z992 Dependence on renal dialysis: Secondary | ICD-10-CM | POA: Diagnosis not present

## 2023-02-10 DIAGNOSIS — N186 End stage renal disease: Secondary | ICD-10-CM | POA: Diagnosis not present

## 2023-02-10 DIAGNOSIS — N2581 Secondary hyperparathyroidism of renal origin: Secondary | ICD-10-CM | POA: Diagnosis not present

## 2023-02-11 DIAGNOSIS — Z992 Dependence on renal dialysis: Secondary | ICD-10-CM | POA: Diagnosis not present

## 2023-02-11 DIAGNOSIS — N2581 Secondary hyperparathyroidism of renal origin: Secondary | ICD-10-CM | POA: Diagnosis not present

## 2023-02-11 DIAGNOSIS — D631 Anemia in chronic kidney disease: Secondary | ICD-10-CM | POA: Diagnosis not present

## 2023-02-11 DIAGNOSIS — N186 End stage renal disease: Secondary | ICD-10-CM | POA: Diagnosis not present

## 2023-02-12 DIAGNOSIS — D631 Anemia in chronic kidney disease: Secondary | ICD-10-CM | POA: Diagnosis not present

## 2023-02-12 DIAGNOSIS — N186 End stage renal disease: Secondary | ICD-10-CM | POA: Diagnosis not present

## 2023-02-12 DIAGNOSIS — Z992 Dependence on renal dialysis: Secondary | ICD-10-CM | POA: Diagnosis not present

## 2023-02-12 DIAGNOSIS — N2581 Secondary hyperparathyroidism of renal origin: Secondary | ICD-10-CM | POA: Diagnosis not present

## 2023-02-13 DIAGNOSIS — E1122 Type 2 diabetes mellitus with diabetic chronic kidney disease: Secondary | ICD-10-CM | POA: Diagnosis not present

## 2023-02-13 DIAGNOSIS — D631 Anemia in chronic kidney disease: Secondary | ICD-10-CM | POA: Diagnosis not present

## 2023-02-13 DIAGNOSIS — Z992 Dependence on renal dialysis: Secondary | ICD-10-CM | POA: Diagnosis not present

## 2023-02-13 DIAGNOSIS — N2581 Secondary hyperparathyroidism of renal origin: Secondary | ICD-10-CM | POA: Diagnosis not present

## 2023-02-13 DIAGNOSIS — N186 End stage renal disease: Secondary | ICD-10-CM | POA: Diagnosis not present

## 2023-02-14 DIAGNOSIS — N2581 Secondary hyperparathyroidism of renal origin: Secondary | ICD-10-CM | POA: Diagnosis not present

## 2023-02-14 DIAGNOSIS — Z4932 Encounter for adequacy testing for peritoneal dialysis: Secondary | ICD-10-CM | POA: Diagnosis not present

## 2023-02-14 DIAGNOSIS — N186 End stage renal disease: Secondary | ICD-10-CM | POA: Diagnosis not present

## 2023-02-14 DIAGNOSIS — D631 Anemia in chronic kidney disease: Secondary | ICD-10-CM | POA: Diagnosis not present

## 2023-02-14 DIAGNOSIS — Z992 Dependence on renal dialysis: Secondary | ICD-10-CM | POA: Diagnosis not present

## 2023-02-15 DIAGNOSIS — Z992 Dependence on renal dialysis: Secondary | ICD-10-CM | POA: Diagnosis not present

## 2023-02-15 DIAGNOSIS — N186 End stage renal disease: Secondary | ICD-10-CM | POA: Diagnosis not present

## 2023-02-15 DIAGNOSIS — D631 Anemia in chronic kidney disease: Secondary | ICD-10-CM | POA: Diagnosis not present

## 2023-02-15 DIAGNOSIS — N2581 Secondary hyperparathyroidism of renal origin: Secondary | ICD-10-CM | POA: Diagnosis not present

## 2023-02-15 DIAGNOSIS — Z4932 Encounter for adequacy testing for peritoneal dialysis: Secondary | ICD-10-CM | POA: Diagnosis not present

## 2023-02-16 DIAGNOSIS — D631 Anemia in chronic kidney disease: Secondary | ICD-10-CM | POA: Diagnosis not present

## 2023-02-16 DIAGNOSIS — N2581 Secondary hyperparathyroidism of renal origin: Secondary | ICD-10-CM | POA: Diagnosis not present

## 2023-02-16 DIAGNOSIS — N186 End stage renal disease: Secondary | ICD-10-CM | POA: Diagnosis not present

## 2023-02-16 DIAGNOSIS — Z992 Dependence on renal dialysis: Secondary | ICD-10-CM | POA: Diagnosis not present

## 2023-02-16 DIAGNOSIS — Z4932 Encounter for adequacy testing for peritoneal dialysis: Secondary | ICD-10-CM | POA: Diagnosis not present

## 2023-02-16 NOTE — Telephone Encounter (Signed)
Called [facility] for patient, referral under clinician review and they will contact patient when they are assigned a provider but unsure of a timeframe this to be completed in. Also confirmed they didn't need anything further from our office. Spoke with patient and gave update I was given and encouraged him to reach out to [facility] in a couple of bushiness days to follow up with them. No questions at this time.

## 2023-02-17 DIAGNOSIS — Z992 Dependence on renal dialysis: Secondary | ICD-10-CM | POA: Diagnosis not present

## 2023-02-17 DIAGNOSIS — D631 Anemia in chronic kidney disease: Secondary | ICD-10-CM | POA: Diagnosis not present

## 2023-02-17 DIAGNOSIS — N186 End stage renal disease: Secondary | ICD-10-CM | POA: Diagnosis not present

## 2023-02-17 DIAGNOSIS — Z4932 Encounter for adequacy testing for peritoneal dialysis: Secondary | ICD-10-CM | POA: Diagnosis not present

## 2023-02-17 DIAGNOSIS — N2581 Secondary hyperparathyroidism of renal origin: Secondary | ICD-10-CM | POA: Diagnosis not present

## 2023-02-18 DIAGNOSIS — Z4932 Encounter for adequacy testing for peritoneal dialysis: Secondary | ICD-10-CM | POA: Diagnosis not present

## 2023-02-18 DIAGNOSIS — Z992 Dependence on renal dialysis: Secondary | ICD-10-CM | POA: Diagnosis not present

## 2023-02-18 DIAGNOSIS — N186 End stage renal disease: Secondary | ICD-10-CM | POA: Diagnosis not present

## 2023-02-18 DIAGNOSIS — D631 Anemia in chronic kidney disease: Secondary | ICD-10-CM | POA: Diagnosis not present

## 2023-02-18 DIAGNOSIS — N2581 Secondary hyperparathyroidism of renal origin: Secondary | ICD-10-CM | POA: Diagnosis not present

## 2023-02-19 DIAGNOSIS — Z992 Dependence on renal dialysis: Secondary | ICD-10-CM | POA: Diagnosis not present

## 2023-02-19 DIAGNOSIS — N2581 Secondary hyperparathyroidism of renal origin: Secondary | ICD-10-CM | POA: Diagnosis not present

## 2023-02-19 DIAGNOSIS — D631 Anemia in chronic kidney disease: Secondary | ICD-10-CM | POA: Diagnosis not present

## 2023-02-19 DIAGNOSIS — N186 End stage renal disease: Secondary | ICD-10-CM | POA: Diagnosis not present

## 2023-02-19 DIAGNOSIS — Z4932 Encounter for adequacy testing for peritoneal dialysis: Secondary | ICD-10-CM | POA: Diagnosis not present

## 2023-02-20 DIAGNOSIS — N186 End stage renal disease: Secondary | ICD-10-CM | POA: Diagnosis not present

## 2023-02-20 DIAGNOSIS — D631 Anemia in chronic kidney disease: Secondary | ICD-10-CM | POA: Diagnosis not present

## 2023-02-20 DIAGNOSIS — Z992 Dependence on renal dialysis: Secondary | ICD-10-CM | POA: Diagnosis not present

## 2023-02-20 DIAGNOSIS — Z4932 Encounter for adequacy testing for peritoneal dialysis: Secondary | ICD-10-CM | POA: Diagnosis not present

## 2023-02-20 DIAGNOSIS — N2581 Secondary hyperparathyroidism of renal origin: Secondary | ICD-10-CM | POA: Diagnosis not present

## 2023-02-21 DIAGNOSIS — D631 Anemia in chronic kidney disease: Secondary | ICD-10-CM | POA: Diagnosis not present

## 2023-02-21 DIAGNOSIS — N2581 Secondary hyperparathyroidism of renal origin: Secondary | ICD-10-CM | POA: Diagnosis not present

## 2023-02-21 DIAGNOSIS — N186 End stage renal disease: Secondary | ICD-10-CM | POA: Diagnosis not present

## 2023-02-21 DIAGNOSIS — Z4932 Encounter for adequacy testing for peritoneal dialysis: Secondary | ICD-10-CM | POA: Diagnosis not present

## 2023-02-21 DIAGNOSIS — Z992 Dependence on renal dialysis: Secondary | ICD-10-CM | POA: Diagnosis not present

## 2023-02-22 DIAGNOSIS — N2581 Secondary hyperparathyroidism of renal origin: Secondary | ICD-10-CM | POA: Diagnosis not present

## 2023-02-22 DIAGNOSIS — N186 End stage renal disease: Secondary | ICD-10-CM | POA: Diagnosis not present

## 2023-02-22 DIAGNOSIS — D631 Anemia in chronic kidney disease: Secondary | ICD-10-CM | POA: Diagnosis not present

## 2023-02-22 DIAGNOSIS — Z992 Dependence on renal dialysis: Secondary | ICD-10-CM | POA: Diagnosis not present

## 2023-02-22 DIAGNOSIS — Z4932 Encounter for adequacy testing for peritoneal dialysis: Secondary | ICD-10-CM | POA: Diagnosis not present

## 2023-02-23 DIAGNOSIS — N2581 Secondary hyperparathyroidism of renal origin: Secondary | ICD-10-CM | POA: Diagnosis not present

## 2023-02-23 DIAGNOSIS — Z4932 Encounter for adequacy testing for peritoneal dialysis: Secondary | ICD-10-CM | POA: Diagnosis not present

## 2023-02-23 DIAGNOSIS — Z992 Dependence on renal dialysis: Secondary | ICD-10-CM | POA: Diagnosis not present

## 2023-02-23 DIAGNOSIS — D631 Anemia in chronic kidney disease: Secondary | ICD-10-CM | POA: Diagnosis not present

## 2023-02-23 DIAGNOSIS — N186 End stage renal disease: Secondary | ICD-10-CM | POA: Diagnosis not present

## 2023-02-24 DIAGNOSIS — D631 Anemia in chronic kidney disease: Secondary | ICD-10-CM | POA: Diagnosis not present

## 2023-02-24 DIAGNOSIS — Z4932 Encounter for adequacy testing for peritoneal dialysis: Secondary | ICD-10-CM | POA: Diagnosis not present

## 2023-02-24 DIAGNOSIS — N2581 Secondary hyperparathyroidism of renal origin: Secondary | ICD-10-CM | POA: Diagnosis not present

## 2023-02-24 DIAGNOSIS — N186 End stage renal disease: Secondary | ICD-10-CM | POA: Diagnosis not present

## 2023-02-24 DIAGNOSIS — Z992 Dependence on renal dialysis: Secondary | ICD-10-CM | POA: Diagnosis not present

## 2023-02-25 DIAGNOSIS — N186 End stage renal disease: Secondary | ICD-10-CM | POA: Diagnosis not present

## 2023-02-25 DIAGNOSIS — D631 Anemia in chronic kidney disease: Secondary | ICD-10-CM | POA: Diagnosis not present

## 2023-02-25 DIAGNOSIS — N2581 Secondary hyperparathyroidism of renal origin: Secondary | ICD-10-CM | POA: Diagnosis not present

## 2023-02-25 DIAGNOSIS — Z4932 Encounter for adequacy testing for peritoneal dialysis: Secondary | ICD-10-CM | POA: Diagnosis not present

## 2023-02-25 DIAGNOSIS — Z992 Dependence on renal dialysis: Secondary | ICD-10-CM | POA: Diagnosis not present

## 2023-02-26 DIAGNOSIS — Z4932 Encounter for adequacy testing for peritoneal dialysis: Secondary | ICD-10-CM | POA: Diagnosis not present

## 2023-02-26 DIAGNOSIS — N2581 Secondary hyperparathyroidism of renal origin: Secondary | ICD-10-CM | POA: Diagnosis not present

## 2023-02-26 DIAGNOSIS — N186 End stage renal disease: Secondary | ICD-10-CM | POA: Diagnosis not present

## 2023-02-26 DIAGNOSIS — Z992 Dependence on renal dialysis: Secondary | ICD-10-CM | POA: Diagnosis not present

## 2023-02-26 DIAGNOSIS — D631 Anemia in chronic kidney disease: Secondary | ICD-10-CM | POA: Diagnosis not present

## 2023-02-27 DIAGNOSIS — N186 End stage renal disease: Secondary | ICD-10-CM | POA: Diagnosis not present

## 2023-02-27 DIAGNOSIS — D631 Anemia in chronic kidney disease: Secondary | ICD-10-CM | POA: Diagnosis not present

## 2023-02-27 DIAGNOSIS — Z992 Dependence on renal dialysis: Secondary | ICD-10-CM | POA: Diagnosis not present

## 2023-02-27 DIAGNOSIS — N2581 Secondary hyperparathyroidism of renal origin: Secondary | ICD-10-CM | POA: Diagnosis not present

## 2023-02-27 DIAGNOSIS — Z4932 Encounter for adequacy testing for peritoneal dialysis: Secondary | ICD-10-CM | POA: Diagnosis not present

## 2023-02-28 DIAGNOSIS — N2581 Secondary hyperparathyroidism of renal origin: Secondary | ICD-10-CM | POA: Diagnosis not present

## 2023-02-28 DIAGNOSIS — Z992 Dependence on renal dialysis: Secondary | ICD-10-CM | POA: Diagnosis not present

## 2023-02-28 DIAGNOSIS — D631 Anemia in chronic kidney disease: Secondary | ICD-10-CM | POA: Diagnosis not present

## 2023-02-28 DIAGNOSIS — N186 End stage renal disease: Secondary | ICD-10-CM | POA: Diagnosis not present

## 2023-02-28 DIAGNOSIS — Z4932 Encounter for adequacy testing for peritoneal dialysis: Secondary | ICD-10-CM | POA: Diagnosis not present

## 2023-03-01 DIAGNOSIS — D631 Anemia in chronic kidney disease: Secondary | ICD-10-CM | POA: Diagnosis not present

## 2023-03-01 DIAGNOSIS — N2581 Secondary hyperparathyroidism of renal origin: Secondary | ICD-10-CM | POA: Diagnosis not present

## 2023-03-01 DIAGNOSIS — Z992 Dependence on renal dialysis: Secondary | ICD-10-CM | POA: Diagnosis not present

## 2023-03-01 DIAGNOSIS — Z4932 Encounter for adequacy testing for peritoneal dialysis: Secondary | ICD-10-CM | POA: Diagnosis not present

## 2023-03-01 DIAGNOSIS — N186 End stage renal disease: Secondary | ICD-10-CM | POA: Diagnosis not present

## 2023-03-02 DIAGNOSIS — D631 Anemia in chronic kidney disease: Secondary | ICD-10-CM | POA: Diagnosis not present

## 2023-03-02 DIAGNOSIS — Z992 Dependence on renal dialysis: Secondary | ICD-10-CM | POA: Diagnosis not present

## 2023-03-02 DIAGNOSIS — Z4932 Encounter for adequacy testing for peritoneal dialysis: Secondary | ICD-10-CM | POA: Diagnosis not present

## 2023-03-02 DIAGNOSIS — N2581 Secondary hyperparathyroidism of renal origin: Secondary | ICD-10-CM | POA: Diagnosis not present

## 2023-03-02 DIAGNOSIS — N186 End stage renal disease: Secondary | ICD-10-CM | POA: Diagnosis not present

## 2023-03-03 DIAGNOSIS — N2581 Secondary hyperparathyroidism of renal origin: Secondary | ICD-10-CM | POA: Diagnosis not present

## 2023-03-03 DIAGNOSIS — Z992 Dependence on renal dialysis: Secondary | ICD-10-CM | POA: Diagnosis not present

## 2023-03-03 DIAGNOSIS — D631 Anemia in chronic kidney disease: Secondary | ICD-10-CM | POA: Diagnosis not present

## 2023-03-03 DIAGNOSIS — N186 End stage renal disease: Secondary | ICD-10-CM | POA: Diagnosis not present

## 2023-03-03 DIAGNOSIS — Z4932 Encounter for adequacy testing for peritoneal dialysis: Secondary | ICD-10-CM | POA: Diagnosis not present

## 2023-03-04 DIAGNOSIS — N2581 Secondary hyperparathyroidism of renal origin: Secondary | ICD-10-CM | POA: Diagnosis not present

## 2023-03-04 DIAGNOSIS — Z992 Dependence on renal dialysis: Secondary | ICD-10-CM | POA: Diagnosis not present

## 2023-03-04 DIAGNOSIS — D631 Anemia in chronic kidney disease: Secondary | ICD-10-CM | POA: Diagnosis not present

## 2023-03-04 DIAGNOSIS — Z4932 Encounter for adequacy testing for peritoneal dialysis: Secondary | ICD-10-CM | POA: Diagnosis not present

## 2023-03-04 DIAGNOSIS — N186 End stage renal disease: Secondary | ICD-10-CM | POA: Diagnosis not present

## 2023-03-05 DIAGNOSIS — N2581 Secondary hyperparathyroidism of renal origin: Secondary | ICD-10-CM | POA: Diagnosis not present

## 2023-03-05 DIAGNOSIS — Z992 Dependence on renal dialysis: Secondary | ICD-10-CM | POA: Diagnosis not present

## 2023-03-05 DIAGNOSIS — Z4932 Encounter for adequacy testing for peritoneal dialysis: Secondary | ICD-10-CM | POA: Diagnosis not present

## 2023-03-05 DIAGNOSIS — D631 Anemia in chronic kidney disease: Secondary | ICD-10-CM | POA: Diagnosis not present

## 2023-03-05 DIAGNOSIS — N186 End stage renal disease: Secondary | ICD-10-CM | POA: Diagnosis not present

## 2023-03-06 DIAGNOSIS — D631 Anemia in chronic kidney disease: Secondary | ICD-10-CM | POA: Diagnosis not present

## 2023-03-06 DIAGNOSIS — N186 End stage renal disease: Secondary | ICD-10-CM | POA: Diagnosis not present

## 2023-03-06 DIAGNOSIS — N2581 Secondary hyperparathyroidism of renal origin: Secondary | ICD-10-CM | POA: Diagnosis not present

## 2023-03-06 DIAGNOSIS — Z4932 Encounter for adequacy testing for peritoneal dialysis: Secondary | ICD-10-CM | POA: Diagnosis not present

## 2023-03-06 DIAGNOSIS — Z992 Dependence on renal dialysis: Secondary | ICD-10-CM | POA: Diagnosis not present

## 2023-03-07 DIAGNOSIS — N186 End stage renal disease: Secondary | ICD-10-CM | POA: Diagnosis not present

## 2023-03-07 DIAGNOSIS — Z992 Dependence on renal dialysis: Secondary | ICD-10-CM | POA: Diagnosis not present

## 2023-03-07 DIAGNOSIS — N2581 Secondary hyperparathyroidism of renal origin: Secondary | ICD-10-CM | POA: Diagnosis not present

## 2023-03-07 DIAGNOSIS — Z4932 Encounter for adequacy testing for peritoneal dialysis: Secondary | ICD-10-CM | POA: Diagnosis not present

## 2023-03-07 DIAGNOSIS — D631 Anemia in chronic kidney disease: Secondary | ICD-10-CM | POA: Diagnosis not present

## 2023-03-08 DIAGNOSIS — D631 Anemia in chronic kidney disease: Secondary | ICD-10-CM | POA: Diagnosis not present

## 2023-03-08 DIAGNOSIS — Z992 Dependence on renal dialysis: Secondary | ICD-10-CM | POA: Diagnosis not present

## 2023-03-08 DIAGNOSIS — Z4932 Encounter for adequacy testing for peritoneal dialysis: Secondary | ICD-10-CM | POA: Diagnosis not present

## 2023-03-08 DIAGNOSIS — N186 End stage renal disease: Secondary | ICD-10-CM | POA: Diagnosis not present

## 2023-03-08 DIAGNOSIS — N2581 Secondary hyperparathyroidism of renal origin: Secondary | ICD-10-CM | POA: Diagnosis not present

## 2023-03-09 DIAGNOSIS — D631 Anemia in chronic kidney disease: Secondary | ICD-10-CM | POA: Diagnosis not present

## 2023-03-09 DIAGNOSIS — N186 End stage renal disease: Secondary | ICD-10-CM | POA: Diagnosis not present

## 2023-03-09 DIAGNOSIS — N2581 Secondary hyperparathyroidism of renal origin: Secondary | ICD-10-CM | POA: Diagnosis not present

## 2023-03-09 DIAGNOSIS — Z4932 Encounter for adequacy testing for peritoneal dialysis: Secondary | ICD-10-CM | POA: Diagnosis not present

## 2023-03-09 DIAGNOSIS — Z992 Dependence on renal dialysis: Secondary | ICD-10-CM | POA: Diagnosis not present

## 2023-03-10 DIAGNOSIS — N186 End stage renal disease: Secondary | ICD-10-CM | POA: Diagnosis not present

## 2023-03-10 DIAGNOSIS — D631 Anemia in chronic kidney disease: Secondary | ICD-10-CM | POA: Diagnosis not present

## 2023-03-10 DIAGNOSIS — Z4932 Encounter for adequacy testing for peritoneal dialysis: Secondary | ICD-10-CM | POA: Diagnosis not present

## 2023-03-10 DIAGNOSIS — Z992 Dependence on renal dialysis: Secondary | ICD-10-CM | POA: Diagnosis not present

## 2023-03-10 DIAGNOSIS — N2581 Secondary hyperparathyroidism of renal origin: Secondary | ICD-10-CM | POA: Diagnosis not present

## 2023-03-11 DIAGNOSIS — N2581 Secondary hyperparathyroidism of renal origin: Secondary | ICD-10-CM | POA: Diagnosis not present

## 2023-03-11 DIAGNOSIS — Z4932 Encounter for adequacy testing for peritoneal dialysis: Secondary | ICD-10-CM | POA: Diagnosis not present

## 2023-03-11 DIAGNOSIS — N186 End stage renal disease: Secondary | ICD-10-CM | POA: Diagnosis not present

## 2023-03-11 DIAGNOSIS — Z992 Dependence on renal dialysis: Secondary | ICD-10-CM | POA: Diagnosis not present

## 2023-03-11 DIAGNOSIS — D631 Anemia in chronic kidney disease: Secondary | ICD-10-CM | POA: Diagnosis not present

## 2023-03-12 DIAGNOSIS — D631 Anemia in chronic kidney disease: Secondary | ICD-10-CM | POA: Diagnosis not present

## 2023-03-12 DIAGNOSIS — Z4932 Encounter for adequacy testing for peritoneal dialysis: Secondary | ICD-10-CM | POA: Diagnosis not present

## 2023-03-12 DIAGNOSIS — N2581 Secondary hyperparathyroidism of renal origin: Secondary | ICD-10-CM | POA: Diagnosis not present

## 2023-03-12 DIAGNOSIS — Z992 Dependence on renal dialysis: Secondary | ICD-10-CM | POA: Diagnosis not present

## 2023-03-12 DIAGNOSIS — N186 End stage renal disease: Secondary | ICD-10-CM | POA: Diagnosis not present

## 2023-03-13 DIAGNOSIS — D631 Anemia in chronic kidney disease: Secondary | ICD-10-CM | POA: Diagnosis not present

## 2023-03-13 DIAGNOSIS — Z992 Dependence on renal dialysis: Secondary | ICD-10-CM | POA: Diagnosis not present

## 2023-03-13 DIAGNOSIS — N186 End stage renal disease: Secondary | ICD-10-CM | POA: Diagnosis not present

## 2023-03-13 DIAGNOSIS — N2581 Secondary hyperparathyroidism of renal origin: Secondary | ICD-10-CM | POA: Diagnosis not present

## 2023-03-13 DIAGNOSIS — Z4932 Encounter for adequacy testing for peritoneal dialysis: Secondary | ICD-10-CM | POA: Diagnosis not present

## 2023-03-14 DIAGNOSIS — Z4932 Encounter for adequacy testing for peritoneal dialysis: Secondary | ICD-10-CM | POA: Diagnosis not present

## 2023-03-14 DIAGNOSIS — N186 End stage renal disease: Secondary | ICD-10-CM | POA: Diagnosis not present

## 2023-03-14 DIAGNOSIS — N2581 Secondary hyperparathyroidism of renal origin: Secondary | ICD-10-CM | POA: Diagnosis not present

## 2023-03-14 DIAGNOSIS — Z992 Dependence on renal dialysis: Secondary | ICD-10-CM | POA: Diagnosis not present

## 2023-03-14 DIAGNOSIS — D631 Anemia in chronic kidney disease: Secondary | ICD-10-CM | POA: Diagnosis not present

## 2023-03-15 DIAGNOSIS — D631 Anemia in chronic kidney disease: Secondary | ICD-10-CM | POA: Diagnosis not present

## 2023-03-15 DIAGNOSIS — N2581 Secondary hyperparathyroidism of renal origin: Secondary | ICD-10-CM | POA: Diagnosis not present

## 2023-03-15 DIAGNOSIS — Z4932 Encounter for adequacy testing for peritoneal dialysis: Secondary | ICD-10-CM | POA: Diagnosis not present

## 2023-03-15 DIAGNOSIS — N186 End stage renal disease: Secondary | ICD-10-CM | POA: Diagnosis not present

## 2023-03-15 DIAGNOSIS — Z992 Dependence on renal dialysis: Secondary | ICD-10-CM | POA: Diagnosis not present

## 2023-03-16 DIAGNOSIS — Z992 Dependence on renal dialysis: Secondary | ICD-10-CM | POA: Diagnosis not present

## 2023-03-16 DIAGNOSIS — Z4932 Encounter for adequacy testing for peritoneal dialysis: Secondary | ICD-10-CM | POA: Diagnosis not present

## 2023-03-16 DIAGNOSIS — D631 Anemia in chronic kidney disease: Secondary | ICD-10-CM | POA: Diagnosis not present

## 2023-03-16 DIAGNOSIS — N186 End stage renal disease: Secondary | ICD-10-CM | POA: Diagnosis not present

## 2023-03-16 DIAGNOSIS — N2581 Secondary hyperparathyroidism of renal origin: Secondary | ICD-10-CM | POA: Diagnosis not present

## 2023-03-16 DIAGNOSIS — E1122 Type 2 diabetes mellitus with diabetic chronic kidney disease: Secondary | ICD-10-CM | POA: Diagnosis not present

## 2023-03-17 DIAGNOSIS — N2581 Secondary hyperparathyroidism of renal origin: Secondary | ICD-10-CM | POA: Diagnosis not present

## 2023-03-17 DIAGNOSIS — N186 End stage renal disease: Secondary | ICD-10-CM | POA: Diagnosis not present

## 2023-03-17 DIAGNOSIS — D631 Anemia in chronic kidney disease: Secondary | ICD-10-CM | POA: Diagnosis not present

## 2023-03-17 DIAGNOSIS — Z992 Dependence on renal dialysis: Secondary | ICD-10-CM | POA: Diagnosis not present

## 2023-03-18 DIAGNOSIS — D631 Anemia in chronic kidney disease: Secondary | ICD-10-CM | POA: Diagnosis not present

## 2023-03-18 DIAGNOSIS — N186 End stage renal disease: Secondary | ICD-10-CM | POA: Diagnosis not present

## 2023-03-18 DIAGNOSIS — N2581 Secondary hyperparathyroidism of renal origin: Secondary | ICD-10-CM | POA: Diagnosis not present

## 2023-03-18 DIAGNOSIS — Z992 Dependence on renal dialysis: Secondary | ICD-10-CM | POA: Diagnosis not present

## 2023-03-19 DIAGNOSIS — Z992 Dependence on renal dialysis: Secondary | ICD-10-CM | POA: Diagnosis not present

## 2023-03-19 DIAGNOSIS — N2581 Secondary hyperparathyroidism of renal origin: Secondary | ICD-10-CM | POA: Diagnosis not present

## 2023-03-19 DIAGNOSIS — D631 Anemia in chronic kidney disease: Secondary | ICD-10-CM | POA: Diagnosis not present

## 2023-03-19 DIAGNOSIS — N186 End stage renal disease: Secondary | ICD-10-CM | POA: Diagnosis not present

## 2023-03-20 DIAGNOSIS — Z992 Dependence on renal dialysis: Secondary | ICD-10-CM | POA: Diagnosis not present

## 2023-03-20 DIAGNOSIS — N186 End stage renal disease: Secondary | ICD-10-CM | POA: Diagnosis not present

## 2023-03-20 DIAGNOSIS — D631 Anemia in chronic kidney disease: Secondary | ICD-10-CM | POA: Diagnosis not present

## 2023-03-20 DIAGNOSIS — N2581 Secondary hyperparathyroidism of renal origin: Secondary | ICD-10-CM | POA: Diagnosis not present

## 2023-03-21 DIAGNOSIS — D631 Anemia in chronic kidney disease: Secondary | ICD-10-CM | POA: Diagnosis not present

## 2023-03-21 DIAGNOSIS — N2581 Secondary hyperparathyroidism of renal origin: Secondary | ICD-10-CM | POA: Diagnosis not present

## 2023-03-21 DIAGNOSIS — N186 End stage renal disease: Secondary | ICD-10-CM | POA: Diagnosis not present

## 2023-03-21 DIAGNOSIS — Z992 Dependence on renal dialysis: Secondary | ICD-10-CM | POA: Diagnosis not present

## 2023-03-22 DIAGNOSIS — Z6841 Body Mass Index (BMI) 40.0 and over, adult: Secondary | ICD-10-CM | POA: Diagnosis not present

## 2023-03-22 DIAGNOSIS — E7800 Pure hypercholesterolemia, unspecified: Secondary | ICD-10-CM | POA: Diagnosis not present

## 2023-03-22 DIAGNOSIS — D696 Thrombocytopenia, unspecified: Secondary | ICD-10-CM | POA: Diagnosis not present

## 2023-03-22 DIAGNOSIS — E1122 Type 2 diabetes mellitus with diabetic chronic kidney disease: Secondary | ICD-10-CM | POA: Diagnosis not present

## 2023-03-22 DIAGNOSIS — N2581 Secondary hyperparathyroidism of renal origin: Secondary | ICD-10-CM | POA: Diagnosis not present

## 2023-03-22 DIAGNOSIS — Z992 Dependence on renal dialysis: Secondary | ICD-10-CM | POA: Diagnosis not present

## 2023-03-22 DIAGNOSIS — N186 End stage renal disease: Secondary | ICD-10-CM | POA: Diagnosis not present

## 2023-03-22 DIAGNOSIS — D631 Anemia in chronic kidney disease: Secondary | ICD-10-CM | POA: Diagnosis not present

## 2023-03-22 DIAGNOSIS — E8889 Other specified metabolic disorders: Secondary | ICD-10-CM | POA: Diagnosis not present

## 2023-03-22 DIAGNOSIS — E66813 Obesity, class 3: Secondary | ICD-10-CM | POA: Diagnosis not present

## 2023-03-22 DIAGNOSIS — I10 Essential (primary) hypertension: Secondary | ICD-10-CM | POA: Diagnosis not present

## 2023-03-23 DIAGNOSIS — D631 Anemia in chronic kidney disease: Secondary | ICD-10-CM | POA: Diagnosis not present

## 2023-03-23 DIAGNOSIS — N2581 Secondary hyperparathyroidism of renal origin: Secondary | ICD-10-CM | POA: Diagnosis not present

## 2023-03-23 DIAGNOSIS — N186 End stage renal disease: Secondary | ICD-10-CM | POA: Diagnosis not present

## 2023-03-23 DIAGNOSIS — Z992 Dependence on renal dialysis: Secondary | ICD-10-CM | POA: Diagnosis not present

## 2023-03-24 DIAGNOSIS — N186 End stage renal disease: Secondary | ICD-10-CM | POA: Diagnosis not present

## 2023-03-24 DIAGNOSIS — D631 Anemia in chronic kidney disease: Secondary | ICD-10-CM | POA: Diagnosis not present

## 2023-03-24 DIAGNOSIS — N2581 Secondary hyperparathyroidism of renal origin: Secondary | ICD-10-CM | POA: Diagnosis not present

## 2023-03-24 DIAGNOSIS — Z992 Dependence on renal dialysis: Secondary | ICD-10-CM | POA: Diagnosis not present

## 2023-03-25 DIAGNOSIS — N2581 Secondary hyperparathyroidism of renal origin: Secondary | ICD-10-CM | POA: Diagnosis not present

## 2023-03-25 DIAGNOSIS — D631 Anemia in chronic kidney disease: Secondary | ICD-10-CM | POA: Diagnosis not present

## 2023-03-25 DIAGNOSIS — N186 End stage renal disease: Secondary | ICD-10-CM | POA: Diagnosis not present

## 2023-03-25 DIAGNOSIS — Z992 Dependence on renal dialysis: Secondary | ICD-10-CM | POA: Diagnosis not present

## 2023-03-26 DIAGNOSIS — N186 End stage renal disease: Secondary | ICD-10-CM | POA: Diagnosis not present

## 2023-03-26 DIAGNOSIS — N2581 Secondary hyperparathyroidism of renal origin: Secondary | ICD-10-CM | POA: Diagnosis not present

## 2023-03-26 DIAGNOSIS — Z992 Dependence on renal dialysis: Secondary | ICD-10-CM | POA: Diagnosis not present

## 2023-03-26 DIAGNOSIS — D631 Anemia in chronic kidney disease: Secondary | ICD-10-CM | POA: Diagnosis not present

## 2023-03-27 DIAGNOSIS — N186 End stage renal disease: Secondary | ICD-10-CM | POA: Diagnosis not present

## 2023-03-27 DIAGNOSIS — N2581 Secondary hyperparathyroidism of renal origin: Secondary | ICD-10-CM | POA: Diagnosis not present

## 2023-03-27 DIAGNOSIS — D631 Anemia in chronic kidney disease: Secondary | ICD-10-CM | POA: Diagnosis not present

## 2023-03-27 DIAGNOSIS — Z992 Dependence on renal dialysis: Secondary | ICD-10-CM | POA: Diagnosis not present

## 2023-03-28 DIAGNOSIS — N2581 Secondary hyperparathyroidism of renal origin: Secondary | ICD-10-CM | POA: Diagnosis not present

## 2023-03-28 DIAGNOSIS — D631 Anemia in chronic kidney disease: Secondary | ICD-10-CM | POA: Diagnosis not present

## 2023-03-28 DIAGNOSIS — Z992 Dependence on renal dialysis: Secondary | ICD-10-CM | POA: Diagnosis not present

## 2023-03-28 DIAGNOSIS — N186 End stage renal disease: Secondary | ICD-10-CM | POA: Diagnosis not present

## 2023-03-29 DIAGNOSIS — D631 Anemia in chronic kidney disease: Secondary | ICD-10-CM | POA: Diagnosis not present

## 2023-03-29 DIAGNOSIS — N186 End stage renal disease: Secondary | ICD-10-CM | POA: Diagnosis not present

## 2023-03-29 DIAGNOSIS — Z992 Dependence on renal dialysis: Secondary | ICD-10-CM | POA: Diagnosis not present

## 2023-03-29 DIAGNOSIS — N2581 Secondary hyperparathyroidism of renal origin: Secondary | ICD-10-CM | POA: Diagnosis not present

## 2023-03-30 DIAGNOSIS — D631 Anemia in chronic kidney disease: Secondary | ICD-10-CM | POA: Diagnosis not present

## 2023-03-30 DIAGNOSIS — Z992 Dependence on renal dialysis: Secondary | ICD-10-CM | POA: Diagnosis not present

## 2023-03-30 DIAGNOSIS — N186 End stage renal disease: Secondary | ICD-10-CM | POA: Diagnosis not present

## 2023-03-30 DIAGNOSIS — N2581 Secondary hyperparathyroidism of renal origin: Secondary | ICD-10-CM | POA: Diagnosis not present

## 2023-03-31 DIAGNOSIS — N2581 Secondary hyperparathyroidism of renal origin: Secondary | ICD-10-CM | POA: Diagnosis not present

## 2023-03-31 DIAGNOSIS — Z992 Dependence on renal dialysis: Secondary | ICD-10-CM | POA: Diagnosis not present

## 2023-03-31 DIAGNOSIS — D631 Anemia in chronic kidney disease: Secondary | ICD-10-CM | POA: Diagnosis not present

## 2023-03-31 DIAGNOSIS — N186 End stage renal disease: Secondary | ICD-10-CM | POA: Diagnosis not present

## 2023-04-01 DIAGNOSIS — N2581 Secondary hyperparathyroidism of renal origin: Secondary | ICD-10-CM | POA: Diagnosis not present

## 2023-04-01 DIAGNOSIS — N186 End stage renal disease: Secondary | ICD-10-CM | POA: Diagnosis not present

## 2023-04-01 DIAGNOSIS — Z992 Dependence on renal dialysis: Secondary | ICD-10-CM | POA: Diagnosis not present

## 2023-04-01 DIAGNOSIS — D631 Anemia in chronic kidney disease: Secondary | ICD-10-CM | POA: Diagnosis not present

## 2023-04-02 DIAGNOSIS — Z992 Dependence on renal dialysis: Secondary | ICD-10-CM | POA: Diagnosis not present

## 2023-04-02 DIAGNOSIS — N186 End stage renal disease: Secondary | ICD-10-CM | POA: Diagnosis not present

## 2023-04-02 DIAGNOSIS — N2581 Secondary hyperparathyroidism of renal origin: Secondary | ICD-10-CM | POA: Diagnosis not present

## 2023-04-02 DIAGNOSIS — D631 Anemia in chronic kidney disease: Secondary | ICD-10-CM | POA: Diagnosis not present

## 2023-04-03 DIAGNOSIS — N2581 Secondary hyperparathyroidism of renal origin: Secondary | ICD-10-CM | POA: Diagnosis not present

## 2023-04-03 DIAGNOSIS — Z992 Dependence on renal dialysis: Secondary | ICD-10-CM | POA: Diagnosis not present

## 2023-04-03 DIAGNOSIS — N186 End stage renal disease: Secondary | ICD-10-CM | POA: Diagnosis not present

## 2023-04-03 DIAGNOSIS — D631 Anemia in chronic kidney disease: Secondary | ICD-10-CM | POA: Diagnosis not present

## 2023-04-04 DIAGNOSIS — N2581 Secondary hyperparathyroidism of renal origin: Secondary | ICD-10-CM | POA: Diagnosis not present

## 2023-04-04 DIAGNOSIS — Z992 Dependence on renal dialysis: Secondary | ICD-10-CM | POA: Diagnosis not present

## 2023-04-04 DIAGNOSIS — N186 End stage renal disease: Secondary | ICD-10-CM | POA: Diagnosis not present

## 2023-04-04 DIAGNOSIS — D631 Anemia in chronic kidney disease: Secondary | ICD-10-CM | POA: Diagnosis not present

## 2023-04-05 DIAGNOSIS — D631 Anemia in chronic kidney disease: Secondary | ICD-10-CM | POA: Diagnosis not present

## 2023-04-05 DIAGNOSIS — Z6841 Body Mass Index (BMI) 40.0 and over, adult: Secondary | ICD-10-CM | POA: Diagnosis not present

## 2023-04-05 DIAGNOSIS — E66813 Obesity, class 3: Secondary | ICD-10-CM | POA: Diagnosis not present

## 2023-04-05 DIAGNOSIS — E7800 Pure hypercholesterolemia, unspecified: Secondary | ICD-10-CM | POA: Diagnosis not present

## 2023-04-05 DIAGNOSIS — N186 End stage renal disease: Secondary | ICD-10-CM | POA: Diagnosis not present

## 2023-04-05 DIAGNOSIS — E1122 Type 2 diabetes mellitus with diabetic chronic kidney disease: Secondary | ICD-10-CM | POA: Diagnosis not present

## 2023-04-05 DIAGNOSIS — N2581 Secondary hyperparathyroidism of renal origin: Secondary | ICD-10-CM | POA: Diagnosis not present

## 2023-04-05 DIAGNOSIS — D696 Thrombocytopenia, unspecified: Secondary | ICD-10-CM | POA: Diagnosis not present

## 2023-04-05 DIAGNOSIS — I10 Essential (primary) hypertension: Secondary | ICD-10-CM | POA: Diagnosis not present

## 2023-04-05 DIAGNOSIS — Z992 Dependence on renal dialysis: Secondary | ICD-10-CM | POA: Diagnosis not present

## 2023-04-06 DIAGNOSIS — D631 Anemia in chronic kidney disease: Secondary | ICD-10-CM | POA: Diagnosis not present

## 2023-04-06 DIAGNOSIS — N186 End stage renal disease: Secondary | ICD-10-CM | POA: Diagnosis not present

## 2023-04-06 DIAGNOSIS — Z992 Dependence on renal dialysis: Secondary | ICD-10-CM | POA: Diagnosis not present

## 2023-04-06 DIAGNOSIS — N2581 Secondary hyperparathyroidism of renal origin: Secondary | ICD-10-CM | POA: Diagnosis not present

## 2023-04-07 DIAGNOSIS — N186 End stage renal disease: Secondary | ICD-10-CM | POA: Diagnosis not present

## 2023-04-07 DIAGNOSIS — Z992 Dependence on renal dialysis: Secondary | ICD-10-CM | POA: Diagnosis not present

## 2023-04-07 DIAGNOSIS — N2581 Secondary hyperparathyroidism of renal origin: Secondary | ICD-10-CM | POA: Diagnosis not present

## 2023-04-07 DIAGNOSIS — D631 Anemia in chronic kidney disease: Secondary | ICD-10-CM | POA: Diagnosis not present

## 2023-04-08 DIAGNOSIS — Z992 Dependence on renal dialysis: Secondary | ICD-10-CM | POA: Diagnosis not present

## 2023-04-08 DIAGNOSIS — N2581 Secondary hyperparathyroidism of renal origin: Secondary | ICD-10-CM | POA: Diagnosis not present

## 2023-04-08 DIAGNOSIS — D631 Anemia in chronic kidney disease: Secondary | ICD-10-CM | POA: Diagnosis not present

## 2023-04-08 DIAGNOSIS — N186 End stage renal disease: Secondary | ICD-10-CM | POA: Diagnosis not present

## 2023-04-09 DIAGNOSIS — Z992 Dependence on renal dialysis: Secondary | ICD-10-CM | POA: Diagnosis not present

## 2023-04-09 DIAGNOSIS — D631 Anemia in chronic kidney disease: Secondary | ICD-10-CM | POA: Diagnosis not present

## 2023-04-09 DIAGNOSIS — N2581 Secondary hyperparathyroidism of renal origin: Secondary | ICD-10-CM | POA: Diagnosis not present

## 2023-04-09 DIAGNOSIS — N186 End stage renal disease: Secondary | ICD-10-CM | POA: Diagnosis not present

## 2023-04-10 DIAGNOSIS — L2989 Other pruritus: Secondary | ICD-10-CM | POA: Diagnosis not present

## 2023-04-10 DIAGNOSIS — Z992 Dependence on renal dialysis: Secondary | ICD-10-CM | POA: Diagnosis not present

## 2023-04-10 DIAGNOSIS — L738 Other specified follicular disorders: Secondary | ICD-10-CM | POA: Diagnosis not present

## 2023-04-10 DIAGNOSIS — L309 Dermatitis, unspecified: Secondary | ICD-10-CM | POA: Diagnosis not present

## 2023-04-10 DIAGNOSIS — N186 End stage renal disease: Secondary | ICD-10-CM | POA: Diagnosis not present

## 2023-04-10 DIAGNOSIS — D631 Anemia in chronic kidney disease: Secondary | ICD-10-CM | POA: Diagnosis not present

## 2023-04-10 DIAGNOSIS — N2581 Secondary hyperparathyroidism of renal origin: Secondary | ICD-10-CM | POA: Diagnosis not present

## 2023-04-11 DIAGNOSIS — Z992 Dependence on renal dialysis: Secondary | ICD-10-CM | POA: Diagnosis not present

## 2023-04-11 DIAGNOSIS — N186 End stage renal disease: Secondary | ICD-10-CM | POA: Diagnosis not present

## 2023-04-11 DIAGNOSIS — N2581 Secondary hyperparathyroidism of renal origin: Secondary | ICD-10-CM | POA: Diagnosis not present

## 2023-04-11 DIAGNOSIS — D631 Anemia in chronic kidney disease: Secondary | ICD-10-CM | POA: Diagnosis not present

## 2023-04-12 DIAGNOSIS — N2581 Secondary hyperparathyroidism of renal origin: Secondary | ICD-10-CM | POA: Diagnosis not present

## 2023-04-12 DIAGNOSIS — Z992 Dependence on renal dialysis: Secondary | ICD-10-CM | POA: Diagnosis not present

## 2023-04-12 DIAGNOSIS — D631 Anemia in chronic kidney disease: Secondary | ICD-10-CM | POA: Diagnosis not present

## 2023-04-12 DIAGNOSIS — N186 End stage renal disease: Secondary | ICD-10-CM | POA: Diagnosis not present

## 2023-04-13 DIAGNOSIS — N186 End stage renal disease: Secondary | ICD-10-CM | POA: Diagnosis not present

## 2023-04-13 DIAGNOSIS — D631 Anemia in chronic kidney disease: Secondary | ICD-10-CM | POA: Diagnosis not present

## 2023-04-13 DIAGNOSIS — N2581 Secondary hyperparathyroidism of renal origin: Secondary | ICD-10-CM | POA: Diagnosis not present

## 2023-04-13 DIAGNOSIS — Z992 Dependence on renal dialysis: Secondary | ICD-10-CM | POA: Diagnosis not present

## 2023-04-14 DIAGNOSIS — Z992 Dependence on renal dialysis: Secondary | ICD-10-CM | POA: Diagnosis not present

## 2023-04-14 DIAGNOSIS — D631 Anemia in chronic kidney disease: Secondary | ICD-10-CM | POA: Diagnosis not present

## 2023-04-14 DIAGNOSIS — N186 End stage renal disease: Secondary | ICD-10-CM | POA: Diagnosis not present

## 2023-04-14 DIAGNOSIS — N2581 Secondary hyperparathyroidism of renal origin: Secondary | ICD-10-CM | POA: Diagnosis not present

## 2023-04-15 DIAGNOSIS — Z992 Dependence on renal dialysis: Secondary | ICD-10-CM | POA: Diagnosis not present

## 2023-04-15 DIAGNOSIS — N186 End stage renal disease: Secondary | ICD-10-CM | POA: Diagnosis not present

## 2023-04-15 DIAGNOSIS — E1122 Type 2 diabetes mellitus with diabetic chronic kidney disease: Secondary | ICD-10-CM | POA: Diagnosis not present

## 2023-04-15 DIAGNOSIS — D631 Anemia in chronic kidney disease: Secondary | ICD-10-CM | POA: Diagnosis not present

## 2023-04-15 DIAGNOSIS — N2581 Secondary hyperparathyroidism of renal origin: Secondary | ICD-10-CM | POA: Diagnosis not present

## 2023-04-16 DIAGNOSIS — D631 Anemia in chronic kidney disease: Secondary | ICD-10-CM | POA: Diagnosis not present

## 2023-04-16 DIAGNOSIS — N2581 Secondary hyperparathyroidism of renal origin: Secondary | ICD-10-CM | POA: Diagnosis not present

## 2023-04-16 DIAGNOSIS — Z992 Dependence on renal dialysis: Secondary | ICD-10-CM | POA: Diagnosis not present

## 2023-04-16 DIAGNOSIS — N186 End stage renal disease: Secondary | ICD-10-CM | POA: Diagnosis not present

## 2023-04-17 DIAGNOSIS — D696 Thrombocytopenia, unspecified: Secondary | ICD-10-CM | POA: Diagnosis not present

## 2023-04-17 DIAGNOSIS — N186 End stage renal disease: Secondary | ICD-10-CM | POA: Diagnosis not present

## 2023-04-17 DIAGNOSIS — D631 Anemia in chronic kidney disease: Secondary | ICD-10-CM | POA: Diagnosis not present

## 2023-04-17 DIAGNOSIS — Z6841 Body Mass Index (BMI) 40.0 and over, adult: Secondary | ICD-10-CM | POA: Diagnosis not present

## 2023-04-17 DIAGNOSIS — I10 Essential (primary) hypertension: Secondary | ICD-10-CM | POA: Diagnosis not present

## 2023-04-17 DIAGNOSIS — E1122 Type 2 diabetes mellitus with diabetic chronic kidney disease: Secondary | ICD-10-CM | POA: Diagnosis not present

## 2023-04-17 DIAGNOSIS — E7800 Pure hypercholesterolemia, unspecified: Secondary | ICD-10-CM | POA: Diagnosis not present

## 2023-04-17 DIAGNOSIS — Z992 Dependence on renal dialysis: Secondary | ICD-10-CM | POA: Diagnosis not present

## 2023-04-17 DIAGNOSIS — N2581 Secondary hyperparathyroidism of renal origin: Secondary | ICD-10-CM | POA: Diagnosis not present

## 2023-04-18 DIAGNOSIS — N186 End stage renal disease: Secondary | ICD-10-CM | POA: Diagnosis not present

## 2023-04-18 DIAGNOSIS — D631 Anemia in chronic kidney disease: Secondary | ICD-10-CM | POA: Diagnosis not present

## 2023-04-18 DIAGNOSIS — N2581 Secondary hyperparathyroidism of renal origin: Secondary | ICD-10-CM | POA: Diagnosis not present

## 2023-04-18 DIAGNOSIS — Z992 Dependence on renal dialysis: Secondary | ICD-10-CM | POA: Diagnosis not present

## 2023-04-19 DIAGNOSIS — Z992 Dependence on renal dialysis: Secondary | ICD-10-CM | POA: Diagnosis not present

## 2023-04-19 DIAGNOSIS — N186 End stage renal disease: Secondary | ICD-10-CM | POA: Diagnosis not present

## 2023-04-19 DIAGNOSIS — N2581 Secondary hyperparathyroidism of renal origin: Secondary | ICD-10-CM | POA: Diagnosis not present

## 2023-04-19 DIAGNOSIS — D631 Anemia in chronic kidney disease: Secondary | ICD-10-CM | POA: Diagnosis not present

## 2023-04-20 DIAGNOSIS — N186 End stage renal disease: Secondary | ICD-10-CM | POA: Diagnosis not present

## 2023-04-20 DIAGNOSIS — Z992 Dependence on renal dialysis: Secondary | ICD-10-CM | POA: Diagnosis not present

## 2023-04-20 DIAGNOSIS — D631 Anemia in chronic kidney disease: Secondary | ICD-10-CM | POA: Diagnosis not present

## 2023-04-20 DIAGNOSIS — N2581 Secondary hyperparathyroidism of renal origin: Secondary | ICD-10-CM | POA: Diagnosis not present

## 2023-04-21 DIAGNOSIS — N2581 Secondary hyperparathyroidism of renal origin: Secondary | ICD-10-CM | POA: Diagnosis not present

## 2023-04-21 DIAGNOSIS — Z992 Dependence on renal dialysis: Secondary | ICD-10-CM | POA: Diagnosis not present

## 2023-04-21 DIAGNOSIS — D631 Anemia in chronic kidney disease: Secondary | ICD-10-CM | POA: Diagnosis not present

## 2023-04-21 DIAGNOSIS — N186 End stage renal disease: Secondary | ICD-10-CM | POA: Diagnosis not present

## 2023-04-22 DIAGNOSIS — N2581 Secondary hyperparathyroidism of renal origin: Secondary | ICD-10-CM | POA: Diagnosis not present

## 2023-04-22 DIAGNOSIS — D631 Anemia in chronic kidney disease: Secondary | ICD-10-CM | POA: Diagnosis not present

## 2023-04-22 DIAGNOSIS — Z992 Dependence on renal dialysis: Secondary | ICD-10-CM | POA: Diagnosis not present

## 2023-04-22 DIAGNOSIS — N186 End stage renal disease: Secondary | ICD-10-CM | POA: Diagnosis not present

## 2023-04-23 DIAGNOSIS — N2581 Secondary hyperparathyroidism of renal origin: Secondary | ICD-10-CM | POA: Diagnosis not present

## 2023-04-23 DIAGNOSIS — Z992 Dependence on renal dialysis: Secondary | ICD-10-CM | POA: Diagnosis not present

## 2023-04-23 DIAGNOSIS — N186 End stage renal disease: Secondary | ICD-10-CM | POA: Diagnosis not present

## 2023-04-23 DIAGNOSIS — D631 Anemia in chronic kidney disease: Secondary | ICD-10-CM | POA: Diagnosis not present

## 2023-04-24 DIAGNOSIS — Z992 Dependence on renal dialysis: Secondary | ICD-10-CM | POA: Diagnosis not present

## 2023-04-24 DIAGNOSIS — N2581 Secondary hyperparathyroidism of renal origin: Secondary | ICD-10-CM | POA: Diagnosis not present

## 2023-04-24 DIAGNOSIS — N186 End stage renal disease: Secondary | ICD-10-CM | POA: Diagnosis not present

## 2023-04-24 DIAGNOSIS — I10 Essential (primary) hypertension: Secondary | ICD-10-CM | POA: Diagnosis not present

## 2023-04-24 DIAGNOSIS — D631 Anemia in chronic kidney disease: Secondary | ICD-10-CM | POA: Diagnosis not present

## 2023-04-24 DIAGNOSIS — G4733 Obstructive sleep apnea (adult) (pediatric): Secondary | ICD-10-CM | POA: Diagnosis not present

## 2023-04-25 DIAGNOSIS — N186 End stage renal disease: Secondary | ICD-10-CM | POA: Diagnosis not present

## 2023-04-25 DIAGNOSIS — D631 Anemia in chronic kidney disease: Secondary | ICD-10-CM | POA: Diagnosis not present

## 2023-04-25 DIAGNOSIS — N2581 Secondary hyperparathyroidism of renal origin: Secondary | ICD-10-CM | POA: Diagnosis not present

## 2023-04-25 DIAGNOSIS — Z992 Dependence on renal dialysis: Secondary | ICD-10-CM | POA: Diagnosis not present

## 2023-04-26 ENCOUNTER — Other Ambulatory Visit (HOSPITAL_COMMUNITY): Payer: Self-pay

## 2023-04-26 DIAGNOSIS — N2581 Secondary hyperparathyroidism of renal origin: Secondary | ICD-10-CM | POA: Diagnosis not present

## 2023-04-26 DIAGNOSIS — N186 End stage renal disease: Secondary | ICD-10-CM | POA: Diagnosis not present

## 2023-04-26 DIAGNOSIS — D631 Anemia in chronic kidney disease: Secondary | ICD-10-CM | POA: Diagnosis not present

## 2023-04-26 DIAGNOSIS — Z992 Dependence on renal dialysis: Secondary | ICD-10-CM | POA: Diagnosis not present

## 2023-04-27 DIAGNOSIS — Z992 Dependence on renal dialysis: Secondary | ICD-10-CM | POA: Diagnosis not present

## 2023-04-27 DIAGNOSIS — D631 Anemia in chronic kidney disease: Secondary | ICD-10-CM | POA: Diagnosis not present

## 2023-04-27 DIAGNOSIS — N2581 Secondary hyperparathyroidism of renal origin: Secondary | ICD-10-CM | POA: Diagnosis not present

## 2023-04-27 DIAGNOSIS — N186 End stage renal disease: Secondary | ICD-10-CM | POA: Diagnosis not present

## 2023-04-28 DIAGNOSIS — D631 Anemia in chronic kidney disease: Secondary | ICD-10-CM | POA: Diagnosis not present

## 2023-04-28 DIAGNOSIS — Z992 Dependence on renal dialysis: Secondary | ICD-10-CM | POA: Diagnosis not present

## 2023-04-28 DIAGNOSIS — N2581 Secondary hyperparathyroidism of renal origin: Secondary | ICD-10-CM | POA: Diagnosis not present

## 2023-04-28 DIAGNOSIS — N186 End stage renal disease: Secondary | ICD-10-CM | POA: Diagnosis not present

## 2023-04-29 DIAGNOSIS — Z992 Dependence on renal dialysis: Secondary | ICD-10-CM | POA: Diagnosis not present

## 2023-04-29 DIAGNOSIS — N186 End stage renal disease: Secondary | ICD-10-CM | POA: Diagnosis not present

## 2023-04-29 DIAGNOSIS — N2581 Secondary hyperparathyroidism of renal origin: Secondary | ICD-10-CM | POA: Diagnosis not present

## 2023-04-29 DIAGNOSIS — D631 Anemia in chronic kidney disease: Secondary | ICD-10-CM | POA: Diagnosis not present

## 2023-04-30 DIAGNOSIS — D631 Anemia in chronic kidney disease: Secondary | ICD-10-CM | POA: Diagnosis not present

## 2023-04-30 DIAGNOSIS — Z992 Dependence on renal dialysis: Secondary | ICD-10-CM | POA: Diagnosis not present

## 2023-04-30 DIAGNOSIS — N186 End stage renal disease: Secondary | ICD-10-CM | POA: Diagnosis not present

## 2023-04-30 DIAGNOSIS — N2581 Secondary hyperparathyroidism of renal origin: Secondary | ICD-10-CM | POA: Diagnosis not present

## 2023-05-01 ENCOUNTER — Other Ambulatory Visit (HOSPITAL_BASED_OUTPATIENT_CLINIC_OR_DEPARTMENT_OTHER): Payer: Self-pay

## 2023-05-01 DIAGNOSIS — N2581 Secondary hyperparathyroidism of renal origin: Secondary | ICD-10-CM | POA: Diagnosis not present

## 2023-05-01 DIAGNOSIS — D631 Anemia in chronic kidney disease: Secondary | ICD-10-CM | POA: Diagnosis not present

## 2023-05-01 DIAGNOSIS — N186 End stage renal disease: Secondary | ICD-10-CM | POA: Diagnosis not present

## 2023-05-01 DIAGNOSIS — Z992 Dependence on renal dialysis: Secondary | ICD-10-CM | POA: Diagnosis not present

## 2023-05-01 MED ORDER — MOUNJARO 2.5 MG/0.5ML ~~LOC~~ SOAJ
2.5000 mg | SUBCUTANEOUS | 0 refills | Status: DC
  Filled 2023-05-01: qty 2, 28d supply, fill #0

## 2023-05-02 DIAGNOSIS — D631 Anemia in chronic kidney disease: Secondary | ICD-10-CM | POA: Diagnosis not present

## 2023-05-02 DIAGNOSIS — N2581 Secondary hyperparathyroidism of renal origin: Secondary | ICD-10-CM | POA: Diagnosis not present

## 2023-05-02 DIAGNOSIS — N186 End stage renal disease: Secondary | ICD-10-CM | POA: Diagnosis not present

## 2023-05-02 DIAGNOSIS — Z992 Dependence on renal dialysis: Secondary | ICD-10-CM | POA: Diagnosis not present

## 2023-05-03 DIAGNOSIS — R7309 Other abnormal glucose: Secondary | ICD-10-CM | POA: Diagnosis not present

## 2023-05-03 DIAGNOSIS — H524 Presbyopia: Secondary | ICD-10-CM | POA: Diagnosis not present

## 2023-05-03 DIAGNOSIS — N186 End stage renal disease: Secondary | ICD-10-CM | POA: Diagnosis not present

## 2023-05-03 DIAGNOSIS — Z992 Dependence on renal dialysis: Secondary | ICD-10-CM | POA: Diagnosis not present

## 2023-05-03 DIAGNOSIS — H04123 Dry eye syndrome of bilateral lacrimal glands: Secondary | ICD-10-CM | POA: Diagnosis not present

## 2023-05-03 DIAGNOSIS — H31012 Macula scars of posterior pole (postinflammatory) (post-traumatic), left eye: Secondary | ICD-10-CM | POA: Diagnosis not present

## 2023-05-03 DIAGNOSIS — H3562 Retinal hemorrhage, left eye: Secondary | ICD-10-CM | POA: Diagnosis not present

## 2023-05-03 DIAGNOSIS — H3581 Retinal edema: Secondary | ICD-10-CM | POA: Diagnosis not present

## 2023-05-03 DIAGNOSIS — N2581 Secondary hyperparathyroidism of renal origin: Secondary | ICD-10-CM | POA: Diagnosis not present

## 2023-05-03 DIAGNOSIS — H3554 Dystrophies primarily involving the retinal pigment epithelium: Secondary | ICD-10-CM | POA: Diagnosis not present

## 2023-05-03 DIAGNOSIS — D631 Anemia in chronic kidney disease: Secondary | ICD-10-CM | POA: Diagnosis not present

## 2023-05-04 DIAGNOSIS — Z992 Dependence on renal dialysis: Secondary | ICD-10-CM | POA: Diagnosis not present

## 2023-05-04 DIAGNOSIS — D631 Anemia in chronic kidney disease: Secondary | ICD-10-CM | POA: Diagnosis not present

## 2023-05-04 DIAGNOSIS — N2581 Secondary hyperparathyroidism of renal origin: Secondary | ICD-10-CM | POA: Diagnosis not present

## 2023-05-04 DIAGNOSIS — N186 End stage renal disease: Secondary | ICD-10-CM | POA: Diagnosis not present

## 2023-05-05 DIAGNOSIS — Z992 Dependence on renal dialysis: Secondary | ICD-10-CM | POA: Diagnosis not present

## 2023-05-05 DIAGNOSIS — N186 End stage renal disease: Secondary | ICD-10-CM | POA: Diagnosis not present

## 2023-05-05 DIAGNOSIS — D631 Anemia in chronic kidney disease: Secondary | ICD-10-CM | POA: Diagnosis not present

## 2023-05-05 DIAGNOSIS — N2581 Secondary hyperparathyroidism of renal origin: Secondary | ICD-10-CM | POA: Diagnosis not present

## 2023-05-06 DIAGNOSIS — Z992 Dependence on renal dialysis: Secondary | ICD-10-CM | POA: Diagnosis not present

## 2023-05-06 DIAGNOSIS — N186 End stage renal disease: Secondary | ICD-10-CM | POA: Diagnosis not present

## 2023-05-06 DIAGNOSIS — N2581 Secondary hyperparathyroidism of renal origin: Secondary | ICD-10-CM | POA: Diagnosis not present

## 2023-05-06 DIAGNOSIS — D631 Anemia in chronic kidney disease: Secondary | ICD-10-CM | POA: Diagnosis not present

## 2023-05-07 DIAGNOSIS — N2581 Secondary hyperparathyroidism of renal origin: Secondary | ICD-10-CM | POA: Diagnosis not present

## 2023-05-07 DIAGNOSIS — N186 End stage renal disease: Secondary | ICD-10-CM | POA: Diagnosis not present

## 2023-05-07 DIAGNOSIS — Z992 Dependence on renal dialysis: Secondary | ICD-10-CM | POA: Diagnosis not present

## 2023-05-07 DIAGNOSIS — D631 Anemia in chronic kidney disease: Secondary | ICD-10-CM | POA: Diagnosis not present

## 2023-05-08 DIAGNOSIS — N2581 Secondary hyperparathyroidism of renal origin: Secondary | ICD-10-CM | POA: Diagnosis not present

## 2023-05-08 DIAGNOSIS — Z992 Dependence on renal dialysis: Secondary | ICD-10-CM | POA: Diagnosis not present

## 2023-05-08 DIAGNOSIS — N186 End stage renal disease: Secondary | ICD-10-CM | POA: Diagnosis not present

## 2023-05-08 DIAGNOSIS — D631 Anemia in chronic kidney disease: Secondary | ICD-10-CM | POA: Diagnosis not present

## 2023-05-09 ENCOUNTER — Other Ambulatory Visit (HOSPITAL_BASED_OUTPATIENT_CLINIC_OR_DEPARTMENT_OTHER): Payer: Self-pay

## 2023-05-09 DIAGNOSIS — I420 Dilated cardiomyopathy: Secondary | ICD-10-CM | POA: Diagnosis not present

## 2023-05-09 DIAGNOSIS — I10 Essential (primary) hypertension: Secondary | ICD-10-CM | POA: Diagnosis not present

## 2023-05-09 DIAGNOSIS — I509 Heart failure, unspecified: Secondary | ICD-10-CM | POA: Diagnosis not present

## 2023-05-09 DIAGNOSIS — D631 Anemia in chronic kidney disease: Secondary | ICD-10-CM | POA: Diagnosis not present

## 2023-05-09 DIAGNOSIS — I5032 Chronic diastolic (congestive) heart failure: Secondary | ICD-10-CM | POA: Diagnosis not present

## 2023-05-09 DIAGNOSIS — N186 End stage renal disease: Secondary | ICD-10-CM | POA: Diagnosis not present

## 2023-05-09 DIAGNOSIS — N2581 Secondary hyperparathyroidism of renal origin: Secondary | ICD-10-CM | POA: Diagnosis not present

## 2023-05-09 DIAGNOSIS — Z992 Dependence on renal dialysis: Secondary | ICD-10-CM | POA: Diagnosis not present

## 2023-05-09 MED ORDER — CARVEDILOL 3.125 MG PO TABS
3.1250 mg | ORAL_TABLET | Freq: Two times a day (BID) | ORAL | 3 refills | Status: DC
  Filled 2023-05-09: qty 180, 90d supply, fill #0

## 2023-05-09 MED ORDER — AMOXICILLIN 500 MG PO CAPS
2000.0000 mg | ORAL_CAPSULE | Freq: Once | ORAL | 0 refills | Status: AC
  Filled 2023-05-09: qty 4, 1d supply, fill #0

## 2023-05-10 DIAGNOSIS — N186 End stage renal disease: Secondary | ICD-10-CM | POA: Diagnosis not present

## 2023-05-10 DIAGNOSIS — Z992 Dependence on renal dialysis: Secondary | ICD-10-CM | POA: Diagnosis not present

## 2023-05-10 DIAGNOSIS — D631 Anemia in chronic kidney disease: Secondary | ICD-10-CM | POA: Diagnosis not present

## 2023-05-10 DIAGNOSIS — N2581 Secondary hyperparathyroidism of renal origin: Secondary | ICD-10-CM | POA: Diagnosis not present

## 2023-05-11 DIAGNOSIS — Z992 Dependence on renal dialysis: Secondary | ICD-10-CM | POA: Diagnosis not present

## 2023-05-11 DIAGNOSIS — N2581 Secondary hyperparathyroidism of renal origin: Secondary | ICD-10-CM | POA: Diagnosis not present

## 2023-05-11 DIAGNOSIS — D631 Anemia in chronic kidney disease: Secondary | ICD-10-CM | POA: Diagnosis not present

## 2023-05-11 DIAGNOSIS — N186 End stage renal disease: Secondary | ICD-10-CM | POA: Diagnosis not present

## 2023-05-12 DIAGNOSIS — N186 End stage renal disease: Secondary | ICD-10-CM | POA: Diagnosis not present

## 2023-05-12 DIAGNOSIS — D631 Anemia in chronic kidney disease: Secondary | ICD-10-CM | POA: Diagnosis not present

## 2023-05-12 DIAGNOSIS — N2581 Secondary hyperparathyroidism of renal origin: Secondary | ICD-10-CM | POA: Diagnosis not present

## 2023-05-12 DIAGNOSIS — Z992 Dependence on renal dialysis: Secondary | ICD-10-CM | POA: Diagnosis not present

## 2023-05-13 DIAGNOSIS — Z992 Dependence on renal dialysis: Secondary | ICD-10-CM | POA: Diagnosis not present

## 2023-05-13 DIAGNOSIS — N2581 Secondary hyperparathyroidism of renal origin: Secondary | ICD-10-CM | POA: Diagnosis not present

## 2023-05-13 DIAGNOSIS — D631 Anemia in chronic kidney disease: Secondary | ICD-10-CM | POA: Diagnosis not present

## 2023-05-13 DIAGNOSIS — N186 End stage renal disease: Secondary | ICD-10-CM | POA: Diagnosis not present

## 2023-05-14 DIAGNOSIS — D631 Anemia in chronic kidney disease: Secondary | ICD-10-CM | POA: Diagnosis not present

## 2023-05-14 DIAGNOSIS — N2581 Secondary hyperparathyroidism of renal origin: Secondary | ICD-10-CM | POA: Diagnosis not present

## 2023-05-14 DIAGNOSIS — N186 End stage renal disease: Secondary | ICD-10-CM | POA: Diagnosis not present

## 2023-05-14 DIAGNOSIS — Z992 Dependence on renal dialysis: Secondary | ICD-10-CM | POA: Diagnosis not present

## 2023-05-15 DIAGNOSIS — Z992 Dependence on renal dialysis: Secondary | ICD-10-CM | POA: Diagnosis not present

## 2023-05-15 DIAGNOSIS — N186 End stage renal disease: Secondary | ICD-10-CM | POA: Diagnosis not present

## 2023-05-15 DIAGNOSIS — D631 Anemia in chronic kidney disease: Secondary | ICD-10-CM | POA: Diagnosis not present

## 2023-05-15 DIAGNOSIS — N2581 Secondary hyperparathyroidism of renal origin: Secondary | ICD-10-CM | POA: Diagnosis not present

## 2023-05-16 DIAGNOSIS — E1122 Type 2 diabetes mellitus with diabetic chronic kidney disease: Secondary | ICD-10-CM | POA: Diagnosis not present

## 2023-05-16 DIAGNOSIS — D631 Anemia in chronic kidney disease: Secondary | ICD-10-CM | POA: Diagnosis not present

## 2023-05-16 DIAGNOSIS — Z992 Dependence on renal dialysis: Secondary | ICD-10-CM | POA: Diagnosis not present

## 2023-05-16 DIAGNOSIS — N2581 Secondary hyperparathyroidism of renal origin: Secondary | ICD-10-CM | POA: Diagnosis not present

## 2023-05-16 DIAGNOSIS — N186 End stage renal disease: Secondary | ICD-10-CM | POA: Diagnosis not present

## 2023-05-17 DIAGNOSIS — D631 Anemia in chronic kidney disease: Secondary | ICD-10-CM | POA: Diagnosis not present

## 2023-05-17 DIAGNOSIS — Z992 Dependence on renal dialysis: Secondary | ICD-10-CM | POA: Diagnosis not present

## 2023-05-17 DIAGNOSIS — Z4932 Encounter for adequacy testing for peritoneal dialysis: Secondary | ICD-10-CM | POA: Diagnosis not present

## 2023-05-17 DIAGNOSIS — N186 End stage renal disease: Secondary | ICD-10-CM | POA: Diagnosis not present

## 2023-05-17 DIAGNOSIS — N2581 Secondary hyperparathyroidism of renal origin: Secondary | ICD-10-CM | POA: Diagnosis not present

## 2023-05-18 DIAGNOSIS — E7800 Pure hypercholesterolemia, unspecified: Secondary | ICD-10-CM | POA: Diagnosis not present

## 2023-05-18 DIAGNOSIS — E1122 Type 2 diabetes mellitus with diabetic chronic kidney disease: Secondary | ICD-10-CM | POA: Diagnosis not present

## 2023-05-18 DIAGNOSIS — D696 Thrombocytopenia, unspecified: Secondary | ICD-10-CM | POA: Diagnosis not present

## 2023-05-18 DIAGNOSIS — N186 End stage renal disease: Secondary | ICD-10-CM | POA: Diagnosis not present

## 2023-05-18 DIAGNOSIS — D631 Anemia in chronic kidney disease: Secondary | ICD-10-CM | POA: Diagnosis not present

## 2023-05-18 DIAGNOSIS — N2581 Secondary hyperparathyroidism of renal origin: Secondary | ICD-10-CM | POA: Diagnosis not present

## 2023-05-18 DIAGNOSIS — Z4932 Encounter for adequacy testing for peritoneal dialysis: Secondary | ICD-10-CM | POA: Diagnosis not present

## 2023-05-18 DIAGNOSIS — Z6841 Body Mass Index (BMI) 40.0 and over, adult: Secondary | ICD-10-CM | POA: Diagnosis not present

## 2023-05-18 DIAGNOSIS — I10 Essential (primary) hypertension: Secondary | ICD-10-CM | POA: Diagnosis not present

## 2023-05-18 DIAGNOSIS — Z992 Dependence on renal dialysis: Secondary | ICD-10-CM | POA: Diagnosis not present

## 2023-05-19 ENCOUNTER — Other Ambulatory Visit: Payer: Self-pay

## 2023-05-19 ENCOUNTER — Other Ambulatory Visit (HOSPITAL_BASED_OUTPATIENT_CLINIC_OR_DEPARTMENT_OTHER): Payer: Self-pay

## 2023-05-19 DIAGNOSIS — Z4932 Encounter for adequacy testing for peritoneal dialysis: Secondary | ICD-10-CM | POA: Diagnosis not present

## 2023-05-19 DIAGNOSIS — D631 Anemia in chronic kidney disease: Secondary | ICD-10-CM | POA: Diagnosis not present

## 2023-05-19 DIAGNOSIS — N2581 Secondary hyperparathyroidism of renal origin: Secondary | ICD-10-CM | POA: Diagnosis not present

## 2023-05-19 DIAGNOSIS — N186 End stage renal disease: Secondary | ICD-10-CM | POA: Diagnosis not present

## 2023-05-19 DIAGNOSIS — Z992 Dependence on renal dialysis: Secondary | ICD-10-CM | POA: Diagnosis not present

## 2023-05-19 MED ORDER — MOUNJARO 5 MG/0.5ML ~~LOC~~ SOAJ
5.0000 mg | SUBCUTANEOUS | 0 refills | Status: DC
  Filled 2023-05-19 – 2023-05-23 (×2): qty 2, 28d supply, fill #0

## 2023-05-19 MED ORDER — DEXCOM G7 SENSOR MISC
3 refills | Status: AC
  Filled 2023-05-19: qty 3, 30d supply, fill #0

## 2023-05-20 ENCOUNTER — Other Ambulatory Visit (HOSPITAL_BASED_OUTPATIENT_CLINIC_OR_DEPARTMENT_OTHER): Payer: Self-pay

## 2023-05-20 DIAGNOSIS — Z4932 Encounter for adequacy testing for peritoneal dialysis: Secondary | ICD-10-CM | POA: Diagnosis not present

## 2023-05-20 DIAGNOSIS — Z992 Dependence on renal dialysis: Secondary | ICD-10-CM | POA: Diagnosis not present

## 2023-05-20 DIAGNOSIS — N2581 Secondary hyperparathyroidism of renal origin: Secondary | ICD-10-CM | POA: Diagnosis not present

## 2023-05-20 DIAGNOSIS — N186 End stage renal disease: Secondary | ICD-10-CM | POA: Diagnosis not present

## 2023-05-20 DIAGNOSIS — D631 Anemia in chronic kidney disease: Secondary | ICD-10-CM | POA: Diagnosis not present

## 2023-05-21 DIAGNOSIS — D631 Anemia in chronic kidney disease: Secondary | ICD-10-CM | POA: Diagnosis not present

## 2023-05-21 DIAGNOSIS — Z4932 Encounter for adequacy testing for peritoneal dialysis: Secondary | ICD-10-CM | POA: Diagnosis not present

## 2023-05-21 DIAGNOSIS — N186 End stage renal disease: Secondary | ICD-10-CM | POA: Diagnosis not present

## 2023-05-21 DIAGNOSIS — N2581 Secondary hyperparathyroidism of renal origin: Secondary | ICD-10-CM | POA: Diagnosis not present

## 2023-05-21 DIAGNOSIS — Z992 Dependence on renal dialysis: Secondary | ICD-10-CM | POA: Diagnosis not present

## 2023-05-22 DIAGNOSIS — Z4932 Encounter for adequacy testing for peritoneal dialysis: Secondary | ICD-10-CM | POA: Diagnosis not present

## 2023-05-22 DIAGNOSIS — N2581 Secondary hyperparathyroidism of renal origin: Secondary | ICD-10-CM | POA: Diagnosis not present

## 2023-05-22 DIAGNOSIS — N186 End stage renal disease: Secondary | ICD-10-CM | POA: Diagnosis not present

## 2023-05-22 DIAGNOSIS — D631 Anemia in chronic kidney disease: Secondary | ICD-10-CM | POA: Diagnosis not present

## 2023-05-22 DIAGNOSIS — Z992 Dependence on renal dialysis: Secondary | ICD-10-CM | POA: Diagnosis not present

## 2023-05-23 ENCOUNTER — Other Ambulatory Visit (HOSPITAL_BASED_OUTPATIENT_CLINIC_OR_DEPARTMENT_OTHER): Payer: Self-pay

## 2023-05-23 ENCOUNTER — Other Ambulatory Visit (HOSPITAL_COMMUNITY): Payer: Self-pay

## 2023-05-23 DIAGNOSIS — Z4932 Encounter for adequacy testing for peritoneal dialysis: Secondary | ICD-10-CM | POA: Diagnosis not present

## 2023-05-23 DIAGNOSIS — N2581 Secondary hyperparathyroidism of renal origin: Secondary | ICD-10-CM | POA: Diagnosis not present

## 2023-05-23 DIAGNOSIS — Z992 Dependence on renal dialysis: Secondary | ICD-10-CM | POA: Diagnosis not present

## 2023-05-23 DIAGNOSIS — N186 End stage renal disease: Secondary | ICD-10-CM | POA: Diagnosis not present

## 2023-05-23 DIAGNOSIS — D631 Anemia in chronic kidney disease: Secondary | ICD-10-CM | POA: Diagnosis not present

## 2023-05-24 ENCOUNTER — Other Ambulatory Visit (HOSPITAL_BASED_OUTPATIENT_CLINIC_OR_DEPARTMENT_OTHER): Payer: Self-pay

## 2023-05-24 DIAGNOSIS — D631 Anemia in chronic kidney disease: Secondary | ICD-10-CM | POA: Diagnosis not present

## 2023-05-24 DIAGNOSIS — N2581 Secondary hyperparathyroidism of renal origin: Secondary | ICD-10-CM | POA: Diagnosis not present

## 2023-05-24 DIAGNOSIS — Z992 Dependence on renal dialysis: Secondary | ICD-10-CM | POA: Diagnosis not present

## 2023-05-24 DIAGNOSIS — N186 End stage renal disease: Secondary | ICD-10-CM | POA: Diagnosis not present

## 2023-05-24 DIAGNOSIS — Z4932 Encounter for adequacy testing for peritoneal dialysis: Secondary | ICD-10-CM | POA: Diagnosis not present

## 2023-05-24 MED ORDER — AMLODIPINE BESYLATE 10 MG PO TABS
ORAL_TABLET | ORAL | 3 refills | Status: DC
  Filled 2023-05-24: qty 90, 90d supply, fill #0
  Filled 2023-08-15: qty 90, 90d supply, fill #1

## 2023-05-25 DIAGNOSIS — Z992 Dependence on renal dialysis: Secondary | ICD-10-CM | POA: Diagnosis not present

## 2023-05-25 DIAGNOSIS — D631 Anemia in chronic kidney disease: Secondary | ICD-10-CM | POA: Diagnosis not present

## 2023-05-25 DIAGNOSIS — Z4932 Encounter for adequacy testing for peritoneal dialysis: Secondary | ICD-10-CM | POA: Diagnosis not present

## 2023-05-25 DIAGNOSIS — N186 End stage renal disease: Secondary | ICD-10-CM | POA: Diagnosis not present

## 2023-05-25 DIAGNOSIS — N2581 Secondary hyperparathyroidism of renal origin: Secondary | ICD-10-CM | POA: Diagnosis not present

## 2023-05-26 DIAGNOSIS — N2581 Secondary hyperparathyroidism of renal origin: Secondary | ICD-10-CM | POA: Diagnosis not present

## 2023-05-26 DIAGNOSIS — D631 Anemia in chronic kidney disease: Secondary | ICD-10-CM | POA: Diagnosis not present

## 2023-05-26 DIAGNOSIS — N186 End stage renal disease: Secondary | ICD-10-CM | POA: Diagnosis not present

## 2023-05-26 DIAGNOSIS — Z992 Dependence on renal dialysis: Secondary | ICD-10-CM | POA: Diagnosis not present

## 2023-05-26 DIAGNOSIS — Z4932 Encounter for adequacy testing for peritoneal dialysis: Secondary | ICD-10-CM | POA: Diagnosis not present

## 2023-05-27 DIAGNOSIS — N186 End stage renal disease: Secondary | ICD-10-CM | POA: Diagnosis not present

## 2023-05-27 DIAGNOSIS — Z992 Dependence on renal dialysis: Secondary | ICD-10-CM | POA: Diagnosis not present

## 2023-05-27 DIAGNOSIS — Z4932 Encounter for adequacy testing for peritoneal dialysis: Secondary | ICD-10-CM | POA: Diagnosis not present

## 2023-05-27 DIAGNOSIS — N2581 Secondary hyperparathyroidism of renal origin: Secondary | ICD-10-CM | POA: Diagnosis not present

## 2023-05-27 DIAGNOSIS — D631 Anemia in chronic kidney disease: Secondary | ICD-10-CM | POA: Diagnosis not present

## 2023-05-28 DIAGNOSIS — N2581 Secondary hyperparathyroidism of renal origin: Secondary | ICD-10-CM | POA: Diagnosis not present

## 2023-05-28 DIAGNOSIS — D631 Anemia in chronic kidney disease: Secondary | ICD-10-CM | POA: Diagnosis not present

## 2023-05-28 DIAGNOSIS — N186 End stage renal disease: Secondary | ICD-10-CM | POA: Diagnosis not present

## 2023-05-28 DIAGNOSIS — Z4932 Encounter for adequacy testing for peritoneal dialysis: Secondary | ICD-10-CM | POA: Diagnosis not present

## 2023-05-28 DIAGNOSIS — Z992 Dependence on renal dialysis: Secondary | ICD-10-CM | POA: Diagnosis not present

## 2023-05-29 DIAGNOSIS — N186 End stage renal disease: Secondary | ICD-10-CM | POA: Diagnosis not present

## 2023-05-29 DIAGNOSIS — D631 Anemia in chronic kidney disease: Secondary | ICD-10-CM | POA: Diagnosis not present

## 2023-05-29 DIAGNOSIS — Z4932 Encounter for adequacy testing for peritoneal dialysis: Secondary | ICD-10-CM | POA: Diagnosis not present

## 2023-05-29 DIAGNOSIS — Z992 Dependence on renal dialysis: Secondary | ICD-10-CM | POA: Diagnosis not present

## 2023-05-29 DIAGNOSIS — N2581 Secondary hyperparathyroidism of renal origin: Secondary | ICD-10-CM | POA: Diagnosis not present

## 2023-05-30 DIAGNOSIS — L810 Postinflammatory hyperpigmentation: Secondary | ICD-10-CM | POA: Diagnosis not present

## 2023-05-30 DIAGNOSIS — L02424 Furuncle of left upper limb: Secondary | ICD-10-CM | POA: Diagnosis not present

## 2023-05-30 DIAGNOSIS — Z992 Dependence on renal dialysis: Secondary | ICD-10-CM | POA: Diagnosis not present

## 2023-05-30 DIAGNOSIS — Z4932 Encounter for adequacy testing for peritoneal dialysis: Secondary | ICD-10-CM | POA: Diagnosis not present

## 2023-05-30 DIAGNOSIS — N2581 Secondary hyperparathyroidism of renal origin: Secondary | ICD-10-CM | POA: Diagnosis not present

## 2023-05-30 DIAGNOSIS — N186 End stage renal disease: Secondary | ICD-10-CM | POA: Diagnosis not present

## 2023-05-30 DIAGNOSIS — D631 Anemia in chronic kidney disease: Secondary | ICD-10-CM | POA: Diagnosis not present

## 2023-05-31 DIAGNOSIS — H04123 Dry eye syndrome of bilateral lacrimal glands: Secondary | ICD-10-CM | POA: Diagnosis not present

## 2023-05-31 DIAGNOSIS — H31012 Macula scars of posterior pole (postinflammatory) (post-traumatic), left eye: Secondary | ICD-10-CM | POA: Diagnosis not present

## 2023-05-31 DIAGNOSIS — Z4932 Encounter for adequacy testing for peritoneal dialysis: Secondary | ICD-10-CM | POA: Diagnosis not present

## 2023-05-31 DIAGNOSIS — H3554 Dystrophies primarily involving the retinal pigment epithelium: Secondary | ICD-10-CM | POA: Diagnosis not present

## 2023-05-31 DIAGNOSIS — H532 Diplopia: Secondary | ICD-10-CM | POA: Diagnosis not present

## 2023-05-31 DIAGNOSIS — Z992 Dependence on renal dialysis: Secondary | ICD-10-CM | POA: Diagnosis not present

## 2023-05-31 DIAGNOSIS — H3581 Retinal edema: Secondary | ICD-10-CM | POA: Diagnosis not present

## 2023-05-31 DIAGNOSIS — N2581 Secondary hyperparathyroidism of renal origin: Secondary | ICD-10-CM | POA: Diagnosis not present

## 2023-05-31 DIAGNOSIS — D631 Anemia in chronic kidney disease: Secondary | ICD-10-CM | POA: Diagnosis not present

## 2023-05-31 DIAGNOSIS — N186 End stage renal disease: Secondary | ICD-10-CM | POA: Diagnosis not present

## 2023-06-01 DIAGNOSIS — N186 End stage renal disease: Secondary | ICD-10-CM | POA: Diagnosis not present

## 2023-06-01 DIAGNOSIS — Z4932 Encounter for adequacy testing for peritoneal dialysis: Secondary | ICD-10-CM | POA: Diagnosis not present

## 2023-06-01 DIAGNOSIS — D631 Anemia in chronic kidney disease: Secondary | ICD-10-CM | POA: Diagnosis not present

## 2023-06-01 DIAGNOSIS — Z992 Dependence on renal dialysis: Secondary | ICD-10-CM | POA: Diagnosis not present

## 2023-06-01 DIAGNOSIS — N2581 Secondary hyperparathyroidism of renal origin: Secondary | ICD-10-CM | POA: Diagnosis not present

## 2023-06-02 DIAGNOSIS — Z992 Dependence on renal dialysis: Secondary | ICD-10-CM | POA: Diagnosis not present

## 2023-06-02 DIAGNOSIS — Z4932 Encounter for adequacy testing for peritoneal dialysis: Secondary | ICD-10-CM | POA: Diagnosis not present

## 2023-06-02 DIAGNOSIS — N2581 Secondary hyperparathyroidism of renal origin: Secondary | ICD-10-CM | POA: Diagnosis not present

## 2023-06-02 DIAGNOSIS — N186 End stage renal disease: Secondary | ICD-10-CM | POA: Diagnosis not present

## 2023-06-02 DIAGNOSIS — D631 Anemia in chronic kidney disease: Secondary | ICD-10-CM | POA: Diagnosis not present

## 2023-06-03 DIAGNOSIS — N2581 Secondary hyperparathyroidism of renal origin: Secondary | ICD-10-CM | POA: Diagnosis not present

## 2023-06-03 DIAGNOSIS — Z4932 Encounter for adequacy testing for peritoneal dialysis: Secondary | ICD-10-CM | POA: Diagnosis not present

## 2023-06-03 DIAGNOSIS — D631 Anemia in chronic kidney disease: Secondary | ICD-10-CM | POA: Diagnosis not present

## 2023-06-03 DIAGNOSIS — N186 End stage renal disease: Secondary | ICD-10-CM | POA: Diagnosis not present

## 2023-06-03 DIAGNOSIS — Z992 Dependence on renal dialysis: Secondary | ICD-10-CM | POA: Diagnosis not present

## 2023-06-04 DIAGNOSIS — N2581 Secondary hyperparathyroidism of renal origin: Secondary | ICD-10-CM | POA: Diagnosis not present

## 2023-06-04 DIAGNOSIS — D631 Anemia in chronic kidney disease: Secondary | ICD-10-CM | POA: Diagnosis not present

## 2023-06-04 DIAGNOSIS — Z4932 Encounter for adequacy testing for peritoneal dialysis: Secondary | ICD-10-CM | POA: Diagnosis not present

## 2023-06-04 DIAGNOSIS — Z992 Dependence on renal dialysis: Secondary | ICD-10-CM | POA: Diagnosis not present

## 2023-06-04 DIAGNOSIS — N186 End stage renal disease: Secondary | ICD-10-CM | POA: Diagnosis not present

## 2023-06-05 DIAGNOSIS — Z4932 Encounter for adequacy testing for peritoneal dialysis: Secondary | ICD-10-CM | POA: Diagnosis not present

## 2023-06-05 DIAGNOSIS — D631 Anemia in chronic kidney disease: Secondary | ICD-10-CM | POA: Diagnosis not present

## 2023-06-05 DIAGNOSIS — N186 End stage renal disease: Secondary | ICD-10-CM | POA: Diagnosis not present

## 2023-06-05 DIAGNOSIS — Z992 Dependence on renal dialysis: Secondary | ICD-10-CM | POA: Diagnosis not present

## 2023-06-05 DIAGNOSIS — N2581 Secondary hyperparathyroidism of renal origin: Secondary | ICD-10-CM | POA: Diagnosis not present

## 2023-06-06 DIAGNOSIS — N186 End stage renal disease: Secondary | ICD-10-CM | POA: Diagnosis not present

## 2023-06-06 DIAGNOSIS — D631 Anemia in chronic kidney disease: Secondary | ICD-10-CM | POA: Diagnosis not present

## 2023-06-06 DIAGNOSIS — Z992 Dependence on renal dialysis: Secondary | ICD-10-CM | POA: Diagnosis not present

## 2023-06-06 DIAGNOSIS — N2581 Secondary hyperparathyroidism of renal origin: Secondary | ICD-10-CM | POA: Diagnosis not present

## 2023-06-06 DIAGNOSIS — Z4932 Encounter for adequacy testing for peritoneal dialysis: Secondary | ICD-10-CM | POA: Diagnosis not present

## 2023-06-07 DIAGNOSIS — D631 Anemia in chronic kidney disease: Secondary | ICD-10-CM | POA: Diagnosis not present

## 2023-06-07 DIAGNOSIS — Z4932 Encounter for adequacy testing for peritoneal dialysis: Secondary | ICD-10-CM | POA: Diagnosis not present

## 2023-06-07 DIAGNOSIS — N2581 Secondary hyperparathyroidism of renal origin: Secondary | ICD-10-CM | POA: Diagnosis not present

## 2023-06-07 DIAGNOSIS — Z992 Dependence on renal dialysis: Secondary | ICD-10-CM | POA: Diagnosis not present

## 2023-06-07 DIAGNOSIS — N186 End stage renal disease: Secondary | ICD-10-CM | POA: Diagnosis not present

## 2023-06-08 DIAGNOSIS — N186 End stage renal disease: Secondary | ICD-10-CM | POA: Diagnosis not present

## 2023-06-08 DIAGNOSIS — Z4932 Encounter for adequacy testing for peritoneal dialysis: Secondary | ICD-10-CM | POA: Diagnosis not present

## 2023-06-08 DIAGNOSIS — Z992 Dependence on renal dialysis: Secondary | ICD-10-CM | POA: Diagnosis not present

## 2023-06-08 DIAGNOSIS — N2581 Secondary hyperparathyroidism of renal origin: Secondary | ICD-10-CM | POA: Diagnosis not present

## 2023-06-08 DIAGNOSIS — D631 Anemia in chronic kidney disease: Secondary | ICD-10-CM | POA: Diagnosis not present

## 2023-06-09 DIAGNOSIS — N186 End stage renal disease: Secondary | ICD-10-CM | POA: Diagnosis not present

## 2023-06-09 DIAGNOSIS — D631 Anemia in chronic kidney disease: Secondary | ICD-10-CM | POA: Diagnosis not present

## 2023-06-09 DIAGNOSIS — Z4932 Encounter for adequacy testing for peritoneal dialysis: Secondary | ICD-10-CM | POA: Diagnosis not present

## 2023-06-09 DIAGNOSIS — N2581 Secondary hyperparathyroidism of renal origin: Secondary | ICD-10-CM | POA: Diagnosis not present

## 2023-06-09 DIAGNOSIS — Z992 Dependence on renal dialysis: Secondary | ICD-10-CM | POA: Diagnosis not present

## 2023-06-10 DIAGNOSIS — D631 Anemia in chronic kidney disease: Secondary | ICD-10-CM | POA: Diagnosis not present

## 2023-06-10 DIAGNOSIS — Z4932 Encounter for adequacy testing for peritoneal dialysis: Secondary | ICD-10-CM | POA: Diagnosis not present

## 2023-06-10 DIAGNOSIS — N186 End stage renal disease: Secondary | ICD-10-CM | POA: Diagnosis not present

## 2023-06-10 DIAGNOSIS — N2581 Secondary hyperparathyroidism of renal origin: Secondary | ICD-10-CM | POA: Diagnosis not present

## 2023-06-10 DIAGNOSIS — Z992 Dependence on renal dialysis: Secondary | ICD-10-CM | POA: Diagnosis not present

## 2023-06-11 DIAGNOSIS — N186 End stage renal disease: Secondary | ICD-10-CM | POA: Diagnosis not present

## 2023-06-11 DIAGNOSIS — Z992 Dependence on renal dialysis: Secondary | ICD-10-CM | POA: Diagnosis not present

## 2023-06-11 DIAGNOSIS — N2581 Secondary hyperparathyroidism of renal origin: Secondary | ICD-10-CM | POA: Diagnosis not present

## 2023-06-11 DIAGNOSIS — Z4932 Encounter for adequacy testing for peritoneal dialysis: Secondary | ICD-10-CM | POA: Diagnosis not present

## 2023-06-11 DIAGNOSIS — D631 Anemia in chronic kidney disease: Secondary | ICD-10-CM | POA: Diagnosis not present

## 2023-06-12 ENCOUNTER — Other Ambulatory Visit (HOSPITAL_BASED_OUTPATIENT_CLINIC_OR_DEPARTMENT_OTHER): Payer: Self-pay

## 2023-06-12 DIAGNOSIS — Z992 Dependence on renal dialysis: Secondary | ICD-10-CM | POA: Diagnosis not present

## 2023-06-12 DIAGNOSIS — N2581 Secondary hyperparathyroidism of renal origin: Secondary | ICD-10-CM | POA: Diagnosis not present

## 2023-06-12 DIAGNOSIS — N186 End stage renal disease: Secondary | ICD-10-CM | POA: Diagnosis not present

## 2023-06-12 DIAGNOSIS — Z4932 Encounter for adequacy testing for peritoneal dialysis: Secondary | ICD-10-CM | POA: Diagnosis not present

## 2023-06-12 DIAGNOSIS — D631 Anemia in chronic kidney disease: Secondary | ICD-10-CM | POA: Diagnosis not present

## 2023-06-12 MED ORDER — CARVEDILOL 6.25 MG PO TABS
6.2500 mg | ORAL_TABLET | Freq: Two times a day (BID) | ORAL | 1 refills | Status: DC
  Filled 2023-06-12: qty 180, 90d supply, fill #0

## 2023-06-12 MED ORDER — HYDRALAZINE HCL 50 MG PO TABS
50.0000 mg | ORAL_TABLET | Freq: Two times a day (BID) | ORAL | 0 refills | Status: DC | PRN
  Filled 2023-06-12: qty 60, 30d supply, fill #0

## 2023-06-13 DIAGNOSIS — Z4932 Encounter for adequacy testing for peritoneal dialysis: Secondary | ICD-10-CM | POA: Diagnosis not present

## 2023-06-13 DIAGNOSIS — Z992 Dependence on renal dialysis: Secondary | ICD-10-CM | POA: Diagnosis not present

## 2023-06-13 DIAGNOSIS — N186 End stage renal disease: Secondary | ICD-10-CM | POA: Diagnosis not present

## 2023-06-13 DIAGNOSIS — N2581 Secondary hyperparathyroidism of renal origin: Secondary | ICD-10-CM | POA: Diagnosis not present

## 2023-06-13 DIAGNOSIS — D631 Anemia in chronic kidney disease: Secondary | ICD-10-CM | POA: Diagnosis not present

## 2023-06-14 ENCOUNTER — Other Ambulatory Visit (HOSPITAL_BASED_OUTPATIENT_CLINIC_OR_DEPARTMENT_OTHER): Payer: Self-pay

## 2023-06-14 DIAGNOSIS — N2581 Secondary hyperparathyroidism of renal origin: Secondary | ICD-10-CM | POA: Diagnosis not present

## 2023-06-14 DIAGNOSIS — N186 End stage renal disease: Secondary | ICD-10-CM | POA: Diagnosis not present

## 2023-06-14 DIAGNOSIS — Z992 Dependence on renal dialysis: Secondary | ICD-10-CM | POA: Diagnosis not present

## 2023-06-14 DIAGNOSIS — I10 Essential (primary) hypertension: Secondary | ICD-10-CM | POA: Diagnosis not present

## 2023-06-14 DIAGNOSIS — D696 Thrombocytopenia, unspecified: Secondary | ICD-10-CM | POA: Diagnosis not present

## 2023-06-14 DIAGNOSIS — Z4932 Encounter for adequacy testing for peritoneal dialysis: Secondary | ICD-10-CM | POA: Diagnosis not present

## 2023-06-14 DIAGNOSIS — E7800 Pure hypercholesterolemia, unspecified: Secondary | ICD-10-CM | POA: Diagnosis not present

## 2023-06-14 DIAGNOSIS — D631 Anemia in chronic kidney disease: Secondary | ICD-10-CM | POA: Diagnosis not present

## 2023-06-14 DIAGNOSIS — Z6841 Body Mass Index (BMI) 40.0 and over, adult: Secondary | ICD-10-CM | POA: Diagnosis not present

## 2023-06-14 DIAGNOSIS — E1122 Type 2 diabetes mellitus with diabetic chronic kidney disease: Secondary | ICD-10-CM | POA: Diagnosis not present

## 2023-06-14 MED ORDER — MOUNJARO 5 MG/0.5ML ~~LOC~~ SOAJ
5.0000 mg | SUBCUTANEOUS | 0 refills | Status: DC
  Filled 2023-06-14: qty 2, 28d supply, fill #0

## 2023-06-15 DIAGNOSIS — N2581 Secondary hyperparathyroidism of renal origin: Secondary | ICD-10-CM | POA: Diagnosis not present

## 2023-06-15 DIAGNOSIS — I371 Nonrheumatic pulmonary valve insufficiency: Secondary | ICD-10-CM | POA: Diagnosis not present

## 2023-06-15 DIAGNOSIS — I2510 Atherosclerotic heart disease of native coronary artery without angina pectoris: Secondary | ICD-10-CM | POA: Diagnosis not present

## 2023-06-15 DIAGNOSIS — N186 End stage renal disease: Secondary | ICD-10-CM | POA: Diagnosis not present

## 2023-06-15 DIAGNOSIS — Z992 Dependence on renal dialysis: Secondary | ICD-10-CM | POA: Diagnosis not present

## 2023-06-15 DIAGNOSIS — Z4932 Encounter for adequacy testing for peritoneal dialysis: Secondary | ICD-10-CM | POA: Diagnosis not present

## 2023-06-15 DIAGNOSIS — D631 Anemia in chronic kidney disease: Secondary | ICD-10-CM | POA: Diagnosis not present

## 2023-06-16 DIAGNOSIS — Z4932 Encounter for adequacy testing for peritoneal dialysis: Secondary | ICD-10-CM | POA: Diagnosis not present

## 2023-06-16 DIAGNOSIS — Z992 Dependence on renal dialysis: Secondary | ICD-10-CM | POA: Diagnosis not present

## 2023-06-16 DIAGNOSIS — E1122 Type 2 diabetes mellitus with diabetic chronic kidney disease: Secondary | ICD-10-CM | POA: Diagnosis not present

## 2023-06-16 DIAGNOSIS — N2581 Secondary hyperparathyroidism of renal origin: Secondary | ICD-10-CM | POA: Diagnosis not present

## 2023-06-16 DIAGNOSIS — N186 End stage renal disease: Secondary | ICD-10-CM | POA: Diagnosis not present

## 2023-06-16 DIAGNOSIS — D631 Anemia in chronic kidney disease: Secondary | ICD-10-CM | POA: Diagnosis not present

## 2023-06-17 DIAGNOSIS — N2581 Secondary hyperparathyroidism of renal origin: Secondary | ICD-10-CM | POA: Diagnosis not present

## 2023-06-17 DIAGNOSIS — Z4932 Encounter for adequacy testing for peritoneal dialysis: Secondary | ICD-10-CM | POA: Diagnosis not present

## 2023-06-17 DIAGNOSIS — D631 Anemia in chronic kidney disease: Secondary | ICD-10-CM | POA: Diagnosis not present

## 2023-06-17 DIAGNOSIS — Z992 Dependence on renal dialysis: Secondary | ICD-10-CM | POA: Diagnosis not present

## 2023-06-17 DIAGNOSIS — N186 End stage renal disease: Secondary | ICD-10-CM | POA: Diagnosis not present

## 2023-06-18 DIAGNOSIS — Z992 Dependence on renal dialysis: Secondary | ICD-10-CM | POA: Diagnosis not present

## 2023-06-18 DIAGNOSIS — N2581 Secondary hyperparathyroidism of renal origin: Secondary | ICD-10-CM | POA: Diagnosis not present

## 2023-06-18 DIAGNOSIS — Z4932 Encounter for adequacy testing for peritoneal dialysis: Secondary | ICD-10-CM | POA: Diagnosis not present

## 2023-06-18 DIAGNOSIS — N186 End stage renal disease: Secondary | ICD-10-CM | POA: Diagnosis not present

## 2023-06-18 DIAGNOSIS — D631 Anemia in chronic kidney disease: Secondary | ICD-10-CM | POA: Diagnosis not present

## 2023-06-19 DIAGNOSIS — Z992 Dependence on renal dialysis: Secondary | ICD-10-CM | POA: Diagnosis not present

## 2023-06-19 DIAGNOSIS — N2581 Secondary hyperparathyroidism of renal origin: Secondary | ICD-10-CM | POA: Diagnosis not present

## 2023-06-19 DIAGNOSIS — Z4932 Encounter for adequacy testing for peritoneal dialysis: Secondary | ICD-10-CM | POA: Diagnosis not present

## 2023-06-19 DIAGNOSIS — D631 Anemia in chronic kidney disease: Secondary | ICD-10-CM | POA: Diagnosis not present

## 2023-06-19 DIAGNOSIS — N186 End stage renal disease: Secondary | ICD-10-CM | POA: Diagnosis not present

## 2023-06-20 DIAGNOSIS — D631 Anemia in chronic kidney disease: Secondary | ICD-10-CM | POA: Diagnosis not present

## 2023-06-20 DIAGNOSIS — D696 Thrombocytopenia, unspecified: Secondary | ICD-10-CM | POA: Diagnosis not present

## 2023-06-20 DIAGNOSIS — E1122 Type 2 diabetes mellitus with diabetic chronic kidney disease: Secondary | ICD-10-CM | POA: Diagnosis not present

## 2023-06-20 DIAGNOSIS — Z992 Dependence on renal dialysis: Secondary | ICD-10-CM | POA: Diagnosis not present

## 2023-06-20 DIAGNOSIS — N2581 Secondary hyperparathyroidism of renal origin: Secondary | ICD-10-CM | POA: Diagnosis not present

## 2023-06-20 DIAGNOSIS — N186 End stage renal disease: Secondary | ICD-10-CM | POA: Diagnosis not present

## 2023-06-20 DIAGNOSIS — Z4932 Encounter for adequacy testing for peritoneal dialysis: Secondary | ICD-10-CM | POA: Diagnosis not present

## 2023-06-20 DIAGNOSIS — I10 Essential (primary) hypertension: Secondary | ICD-10-CM | POA: Diagnosis not present

## 2023-06-21 DIAGNOSIS — N186 End stage renal disease: Secondary | ICD-10-CM | POA: Diagnosis not present

## 2023-06-21 DIAGNOSIS — Z992 Dependence on renal dialysis: Secondary | ICD-10-CM | POA: Diagnosis not present

## 2023-06-21 DIAGNOSIS — D631 Anemia in chronic kidney disease: Secondary | ICD-10-CM | POA: Diagnosis not present

## 2023-06-21 DIAGNOSIS — Z4932 Encounter for adequacy testing for peritoneal dialysis: Secondary | ICD-10-CM | POA: Diagnosis not present

## 2023-06-21 DIAGNOSIS — N2581 Secondary hyperparathyroidism of renal origin: Secondary | ICD-10-CM | POA: Diagnosis not present

## 2023-06-22 ENCOUNTER — Encounter (INDEPENDENT_AMBULATORY_CARE_PROVIDER_SITE_OTHER): Payer: Medicare Other | Admitting: Ophthalmology

## 2023-06-22 DIAGNOSIS — Z7985 Long-term (current) use of injectable non-insulin antidiabetic drugs: Secondary | ICD-10-CM | POA: Diagnosis not present

## 2023-06-22 DIAGNOSIS — N2581 Secondary hyperparathyroidism of renal origin: Secondary | ICD-10-CM | POA: Diagnosis not present

## 2023-06-22 DIAGNOSIS — I10 Essential (primary) hypertension: Secondary | ICD-10-CM

## 2023-06-22 DIAGNOSIS — Z4932 Encounter for adequacy testing for peritoneal dialysis: Secondary | ICD-10-CM | POA: Diagnosis not present

## 2023-06-22 DIAGNOSIS — H43813 Vitreous degeneration, bilateral: Secondary | ICD-10-CM

## 2023-06-22 DIAGNOSIS — E113393 Type 2 diabetes mellitus with moderate nonproliferative diabetic retinopathy without macular edema, bilateral: Secondary | ICD-10-CM | POA: Diagnosis not present

## 2023-06-22 DIAGNOSIS — D631 Anemia in chronic kidney disease: Secondary | ICD-10-CM | POA: Diagnosis not present

## 2023-06-22 DIAGNOSIS — H2513 Age-related nuclear cataract, bilateral: Secondary | ICD-10-CM

## 2023-06-22 DIAGNOSIS — H348311 Tributary (branch) retinal vein occlusion, right eye, with retinal neovascularization: Secondary | ICD-10-CM

## 2023-06-22 DIAGNOSIS — H35033 Hypertensive retinopathy, bilateral: Secondary | ICD-10-CM

## 2023-06-22 DIAGNOSIS — N186 End stage renal disease: Secondary | ICD-10-CM | POA: Diagnosis not present

## 2023-06-22 DIAGNOSIS — Z992 Dependence on renal dialysis: Secondary | ICD-10-CM | POA: Diagnosis not present

## 2023-06-23 DIAGNOSIS — N2581 Secondary hyperparathyroidism of renal origin: Secondary | ICD-10-CM | POA: Diagnosis not present

## 2023-06-23 DIAGNOSIS — Z992 Dependence on renal dialysis: Secondary | ICD-10-CM | POA: Diagnosis not present

## 2023-06-23 DIAGNOSIS — N186 End stage renal disease: Secondary | ICD-10-CM | POA: Diagnosis not present

## 2023-06-23 DIAGNOSIS — Z4932 Encounter for adequacy testing for peritoneal dialysis: Secondary | ICD-10-CM | POA: Diagnosis not present

## 2023-06-23 DIAGNOSIS — D631 Anemia in chronic kidney disease: Secondary | ICD-10-CM | POA: Diagnosis not present

## 2023-06-24 DIAGNOSIS — N186 End stage renal disease: Secondary | ICD-10-CM | POA: Diagnosis not present

## 2023-06-24 DIAGNOSIS — Z992 Dependence on renal dialysis: Secondary | ICD-10-CM | POA: Diagnosis not present

## 2023-06-24 DIAGNOSIS — Z4932 Encounter for adequacy testing for peritoneal dialysis: Secondary | ICD-10-CM | POA: Diagnosis not present

## 2023-06-24 DIAGNOSIS — N2581 Secondary hyperparathyroidism of renal origin: Secondary | ICD-10-CM | POA: Diagnosis not present

## 2023-06-24 DIAGNOSIS — D631 Anemia in chronic kidney disease: Secondary | ICD-10-CM | POA: Diagnosis not present

## 2023-06-25 DIAGNOSIS — Z992 Dependence on renal dialysis: Secondary | ICD-10-CM | POA: Diagnosis not present

## 2023-06-25 DIAGNOSIS — N186 End stage renal disease: Secondary | ICD-10-CM | POA: Diagnosis not present

## 2023-06-25 DIAGNOSIS — Z4932 Encounter for adequacy testing for peritoneal dialysis: Secondary | ICD-10-CM | POA: Diagnosis not present

## 2023-06-25 DIAGNOSIS — D631 Anemia in chronic kidney disease: Secondary | ICD-10-CM | POA: Diagnosis not present

## 2023-06-25 DIAGNOSIS — N2581 Secondary hyperparathyroidism of renal origin: Secondary | ICD-10-CM | POA: Diagnosis not present

## 2023-06-26 DIAGNOSIS — Z992 Dependence on renal dialysis: Secondary | ICD-10-CM | POA: Diagnosis not present

## 2023-06-26 DIAGNOSIS — N2581 Secondary hyperparathyroidism of renal origin: Secondary | ICD-10-CM | POA: Diagnosis not present

## 2023-06-26 DIAGNOSIS — D631 Anemia in chronic kidney disease: Secondary | ICD-10-CM | POA: Diagnosis not present

## 2023-06-26 DIAGNOSIS — N186 End stage renal disease: Secondary | ICD-10-CM | POA: Diagnosis not present

## 2023-06-26 DIAGNOSIS — Z4932 Encounter for adequacy testing for peritoneal dialysis: Secondary | ICD-10-CM | POA: Diagnosis not present

## 2023-06-27 DIAGNOSIS — Z992 Dependence on renal dialysis: Secondary | ICD-10-CM | POA: Diagnosis not present

## 2023-06-27 DIAGNOSIS — D631 Anemia in chronic kidney disease: Secondary | ICD-10-CM | POA: Diagnosis not present

## 2023-06-27 DIAGNOSIS — N2581 Secondary hyperparathyroidism of renal origin: Secondary | ICD-10-CM | POA: Diagnosis not present

## 2023-06-27 DIAGNOSIS — N186 End stage renal disease: Secondary | ICD-10-CM | POA: Diagnosis not present

## 2023-06-27 DIAGNOSIS — Z4932 Encounter for adequacy testing for peritoneal dialysis: Secondary | ICD-10-CM | POA: Diagnosis not present

## 2023-06-28 DIAGNOSIS — N186 End stage renal disease: Secondary | ICD-10-CM | POA: Diagnosis not present

## 2023-06-28 DIAGNOSIS — Z4932 Encounter for adequacy testing for peritoneal dialysis: Secondary | ICD-10-CM | POA: Diagnosis not present

## 2023-06-28 DIAGNOSIS — D631 Anemia in chronic kidney disease: Secondary | ICD-10-CM | POA: Diagnosis not present

## 2023-06-28 DIAGNOSIS — Z992 Dependence on renal dialysis: Secondary | ICD-10-CM | POA: Diagnosis not present

## 2023-06-28 DIAGNOSIS — N2581 Secondary hyperparathyroidism of renal origin: Secondary | ICD-10-CM | POA: Diagnosis not present

## 2023-06-29 DIAGNOSIS — Z992 Dependence on renal dialysis: Secondary | ICD-10-CM | POA: Diagnosis not present

## 2023-06-29 DIAGNOSIS — D631 Anemia in chronic kidney disease: Secondary | ICD-10-CM | POA: Diagnosis not present

## 2023-06-29 DIAGNOSIS — Z4932 Encounter for adequacy testing for peritoneal dialysis: Secondary | ICD-10-CM | POA: Diagnosis not present

## 2023-06-29 DIAGNOSIS — N186 End stage renal disease: Secondary | ICD-10-CM | POA: Diagnosis not present

## 2023-06-29 DIAGNOSIS — N2581 Secondary hyperparathyroidism of renal origin: Secondary | ICD-10-CM | POA: Diagnosis not present

## 2023-06-30 DIAGNOSIS — Z4932 Encounter for adequacy testing for peritoneal dialysis: Secondary | ICD-10-CM | POA: Diagnosis not present

## 2023-06-30 DIAGNOSIS — N2581 Secondary hyperparathyroidism of renal origin: Secondary | ICD-10-CM | POA: Diagnosis not present

## 2023-06-30 DIAGNOSIS — D631 Anemia in chronic kidney disease: Secondary | ICD-10-CM | POA: Diagnosis not present

## 2023-06-30 DIAGNOSIS — Z992 Dependence on renal dialysis: Secondary | ICD-10-CM | POA: Diagnosis not present

## 2023-06-30 DIAGNOSIS — N186 End stage renal disease: Secondary | ICD-10-CM | POA: Diagnosis not present

## 2023-07-01 DIAGNOSIS — N2581 Secondary hyperparathyroidism of renal origin: Secondary | ICD-10-CM | POA: Diagnosis not present

## 2023-07-01 DIAGNOSIS — D631 Anemia in chronic kidney disease: Secondary | ICD-10-CM | POA: Diagnosis not present

## 2023-07-01 DIAGNOSIS — Z4932 Encounter for adequacy testing for peritoneal dialysis: Secondary | ICD-10-CM | POA: Diagnosis not present

## 2023-07-01 DIAGNOSIS — N186 End stage renal disease: Secondary | ICD-10-CM | POA: Diagnosis not present

## 2023-07-01 DIAGNOSIS — Z992 Dependence on renal dialysis: Secondary | ICD-10-CM | POA: Diagnosis not present

## 2023-07-02 DIAGNOSIS — Z4932 Encounter for adequacy testing for peritoneal dialysis: Secondary | ICD-10-CM | POA: Diagnosis not present

## 2023-07-02 DIAGNOSIS — N186 End stage renal disease: Secondary | ICD-10-CM | POA: Diagnosis not present

## 2023-07-02 DIAGNOSIS — N2581 Secondary hyperparathyroidism of renal origin: Secondary | ICD-10-CM | POA: Diagnosis not present

## 2023-07-02 DIAGNOSIS — D631 Anemia in chronic kidney disease: Secondary | ICD-10-CM | POA: Diagnosis not present

## 2023-07-02 DIAGNOSIS — Z992 Dependence on renal dialysis: Secondary | ICD-10-CM | POA: Diagnosis not present

## 2023-07-03 ENCOUNTER — Other Ambulatory Visit (HOSPITAL_BASED_OUTPATIENT_CLINIC_OR_DEPARTMENT_OTHER): Payer: Self-pay

## 2023-07-03 ENCOUNTER — Other Ambulatory Visit: Payer: Self-pay

## 2023-07-03 DIAGNOSIS — N186 End stage renal disease: Secondary | ICD-10-CM | POA: Diagnosis not present

## 2023-07-03 DIAGNOSIS — Z4932 Encounter for adequacy testing for peritoneal dialysis: Secondary | ICD-10-CM | POA: Diagnosis not present

## 2023-07-03 DIAGNOSIS — I10 Essential (primary) hypertension: Secondary | ICD-10-CM | POA: Diagnosis not present

## 2023-07-03 DIAGNOSIS — I5189 Other ill-defined heart diseases: Secondary | ICD-10-CM | POA: Diagnosis not present

## 2023-07-03 DIAGNOSIS — N2581 Secondary hyperparathyroidism of renal origin: Secondary | ICD-10-CM | POA: Diagnosis not present

## 2023-07-03 DIAGNOSIS — Z03818 Encounter for observation for suspected exposure to other biological agents ruled out: Secondary | ICD-10-CM | POA: Diagnosis not present

## 2023-07-03 DIAGNOSIS — D631 Anemia in chronic kidney disease: Secondary | ICD-10-CM | POA: Diagnosis not present

## 2023-07-03 DIAGNOSIS — R0989 Other specified symptoms and signs involving the circulatory and respiratory systems: Secondary | ICD-10-CM | POA: Diagnosis not present

## 2023-07-03 DIAGNOSIS — Z992 Dependence on renal dialysis: Secondary | ICD-10-CM | POA: Diagnosis not present

## 2023-07-03 DIAGNOSIS — E1122 Type 2 diabetes mellitus with diabetic chronic kidney disease: Secondary | ICD-10-CM | POA: Diagnosis not present

## 2023-07-03 MED ORDER — OSELTAMIVIR PHOSPHATE 75 MG PO CAPS
75.0000 mg | ORAL_CAPSULE | Freq: Every day | ORAL | 0 refills | Status: DC
  Filled 2023-07-03 (×2): qty 1, 1d supply, fill #0

## 2023-07-03 MED ORDER — HYDRALAZINE HCL 50 MG PO TABS
50.0000 mg | ORAL_TABLET | Freq: Three times a day (TID) | ORAL | 3 refills | Status: DC
  Filled 2023-07-03: qty 270, 90d supply, fill #0

## 2023-07-04 DIAGNOSIS — N186 End stage renal disease: Secondary | ICD-10-CM | POA: Diagnosis not present

## 2023-07-04 DIAGNOSIS — Z992 Dependence on renal dialysis: Secondary | ICD-10-CM | POA: Diagnosis not present

## 2023-07-04 DIAGNOSIS — Z4932 Encounter for adequacy testing for peritoneal dialysis: Secondary | ICD-10-CM | POA: Diagnosis not present

## 2023-07-04 DIAGNOSIS — N2581 Secondary hyperparathyroidism of renal origin: Secondary | ICD-10-CM | POA: Diagnosis not present

## 2023-07-04 DIAGNOSIS — D631 Anemia in chronic kidney disease: Secondary | ICD-10-CM | POA: Diagnosis not present

## 2023-07-05 ENCOUNTER — Other Ambulatory Visit (HOSPITAL_BASED_OUTPATIENT_CLINIC_OR_DEPARTMENT_OTHER): Payer: Self-pay

## 2023-07-05 DIAGNOSIS — Z992 Dependence on renal dialysis: Secondary | ICD-10-CM | POA: Diagnosis not present

## 2023-07-05 DIAGNOSIS — Z4932 Encounter for adequacy testing for peritoneal dialysis: Secondary | ICD-10-CM | POA: Diagnosis not present

## 2023-07-05 DIAGNOSIS — N2581 Secondary hyperparathyroidism of renal origin: Secondary | ICD-10-CM | POA: Diagnosis not present

## 2023-07-05 DIAGNOSIS — N186 End stage renal disease: Secondary | ICD-10-CM | POA: Diagnosis not present

## 2023-07-05 DIAGNOSIS — D631 Anemia in chronic kidney disease: Secondary | ICD-10-CM | POA: Diagnosis not present

## 2023-07-05 MED ORDER — CARVEDILOL 25 MG PO TABS
25.0000 mg | ORAL_TABLET | Freq: Two times a day (BID) | ORAL | 3 refills | Status: DC
  Filled 2023-07-05 – 2023-07-21 (×2): qty 180, 90d supply, fill #0

## 2023-07-06 DIAGNOSIS — D631 Anemia in chronic kidney disease: Secondary | ICD-10-CM | POA: Diagnosis not present

## 2023-07-06 DIAGNOSIS — Z992 Dependence on renal dialysis: Secondary | ICD-10-CM | POA: Diagnosis not present

## 2023-07-06 DIAGNOSIS — N2581 Secondary hyperparathyroidism of renal origin: Secondary | ICD-10-CM | POA: Diagnosis not present

## 2023-07-06 DIAGNOSIS — N186 End stage renal disease: Secondary | ICD-10-CM | POA: Diagnosis not present

## 2023-07-06 DIAGNOSIS — Z4932 Encounter for adequacy testing for peritoneal dialysis: Secondary | ICD-10-CM | POA: Diagnosis not present

## 2023-07-07 DIAGNOSIS — N2581 Secondary hyperparathyroidism of renal origin: Secondary | ICD-10-CM | POA: Diagnosis not present

## 2023-07-07 DIAGNOSIS — Z992 Dependence on renal dialysis: Secondary | ICD-10-CM | POA: Diagnosis not present

## 2023-07-07 DIAGNOSIS — D631 Anemia in chronic kidney disease: Secondary | ICD-10-CM | POA: Diagnosis not present

## 2023-07-07 DIAGNOSIS — Z4932 Encounter for adequacy testing for peritoneal dialysis: Secondary | ICD-10-CM | POA: Diagnosis not present

## 2023-07-07 DIAGNOSIS — N186 End stage renal disease: Secondary | ICD-10-CM | POA: Diagnosis not present

## 2023-07-08 DIAGNOSIS — Z4932 Encounter for adequacy testing for peritoneal dialysis: Secondary | ICD-10-CM | POA: Diagnosis not present

## 2023-07-08 DIAGNOSIS — D631 Anemia in chronic kidney disease: Secondary | ICD-10-CM | POA: Diagnosis not present

## 2023-07-08 DIAGNOSIS — N186 End stage renal disease: Secondary | ICD-10-CM | POA: Diagnosis not present

## 2023-07-08 DIAGNOSIS — N2581 Secondary hyperparathyroidism of renal origin: Secondary | ICD-10-CM | POA: Diagnosis not present

## 2023-07-08 DIAGNOSIS — Z992 Dependence on renal dialysis: Secondary | ICD-10-CM | POA: Diagnosis not present

## 2023-07-09 DIAGNOSIS — Z4932 Encounter for adequacy testing for peritoneal dialysis: Secondary | ICD-10-CM | POA: Diagnosis not present

## 2023-07-09 DIAGNOSIS — Z992 Dependence on renal dialysis: Secondary | ICD-10-CM | POA: Diagnosis not present

## 2023-07-09 DIAGNOSIS — N2581 Secondary hyperparathyroidism of renal origin: Secondary | ICD-10-CM | POA: Diagnosis not present

## 2023-07-09 DIAGNOSIS — D631 Anemia in chronic kidney disease: Secondary | ICD-10-CM | POA: Diagnosis not present

## 2023-07-09 DIAGNOSIS — N186 End stage renal disease: Secondary | ICD-10-CM | POA: Diagnosis not present

## 2023-07-10 DIAGNOSIS — Z4932 Encounter for adequacy testing for peritoneal dialysis: Secondary | ICD-10-CM | POA: Diagnosis not present

## 2023-07-10 DIAGNOSIS — Z992 Dependence on renal dialysis: Secondary | ICD-10-CM | POA: Diagnosis not present

## 2023-07-10 DIAGNOSIS — D631 Anemia in chronic kidney disease: Secondary | ICD-10-CM | POA: Diagnosis not present

## 2023-07-10 DIAGNOSIS — N186 End stage renal disease: Secondary | ICD-10-CM | POA: Diagnosis not present

## 2023-07-10 DIAGNOSIS — N2581 Secondary hyperparathyroidism of renal origin: Secondary | ICD-10-CM | POA: Diagnosis not present

## 2023-07-11 DIAGNOSIS — Z992 Dependence on renal dialysis: Secondary | ICD-10-CM | POA: Diagnosis not present

## 2023-07-11 DIAGNOSIS — D631 Anemia in chronic kidney disease: Secondary | ICD-10-CM | POA: Diagnosis not present

## 2023-07-11 DIAGNOSIS — N186 End stage renal disease: Secondary | ICD-10-CM | POA: Diagnosis not present

## 2023-07-11 DIAGNOSIS — N2581 Secondary hyperparathyroidism of renal origin: Secondary | ICD-10-CM | POA: Diagnosis not present

## 2023-07-11 DIAGNOSIS — Z4932 Encounter for adequacy testing for peritoneal dialysis: Secondary | ICD-10-CM | POA: Diagnosis not present

## 2023-07-12 DIAGNOSIS — N186 End stage renal disease: Secondary | ICD-10-CM | POA: Diagnosis not present

## 2023-07-12 DIAGNOSIS — D631 Anemia in chronic kidney disease: Secondary | ICD-10-CM | POA: Diagnosis not present

## 2023-07-12 DIAGNOSIS — Z4932 Encounter for adequacy testing for peritoneal dialysis: Secondary | ICD-10-CM | POA: Diagnosis not present

## 2023-07-12 DIAGNOSIS — Z992 Dependence on renal dialysis: Secondary | ICD-10-CM | POA: Diagnosis not present

## 2023-07-12 DIAGNOSIS — N2581 Secondary hyperparathyroidism of renal origin: Secondary | ICD-10-CM | POA: Diagnosis not present

## 2023-07-13 DIAGNOSIS — N2581 Secondary hyperparathyroidism of renal origin: Secondary | ICD-10-CM | POA: Diagnosis not present

## 2023-07-13 DIAGNOSIS — Z992 Dependence on renal dialysis: Secondary | ICD-10-CM | POA: Diagnosis not present

## 2023-07-13 DIAGNOSIS — D631 Anemia in chronic kidney disease: Secondary | ICD-10-CM | POA: Diagnosis not present

## 2023-07-13 DIAGNOSIS — Z4932 Encounter for adequacy testing for peritoneal dialysis: Secondary | ICD-10-CM | POA: Diagnosis not present

## 2023-07-13 DIAGNOSIS — N186 End stage renal disease: Secondary | ICD-10-CM | POA: Diagnosis not present

## 2023-07-14 DIAGNOSIS — N2581 Secondary hyperparathyroidism of renal origin: Secondary | ICD-10-CM | POA: Diagnosis not present

## 2023-07-14 DIAGNOSIS — N186 End stage renal disease: Secondary | ICD-10-CM | POA: Diagnosis not present

## 2023-07-14 DIAGNOSIS — Z4932 Encounter for adequacy testing for peritoneal dialysis: Secondary | ICD-10-CM | POA: Diagnosis not present

## 2023-07-14 DIAGNOSIS — Z992 Dependence on renal dialysis: Secondary | ICD-10-CM | POA: Diagnosis not present

## 2023-07-14 DIAGNOSIS — E1122 Type 2 diabetes mellitus with diabetic chronic kidney disease: Secondary | ICD-10-CM | POA: Diagnosis not present

## 2023-07-14 DIAGNOSIS — D631 Anemia in chronic kidney disease: Secondary | ICD-10-CM | POA: Diagnosis not present

## 2023-07-15 ENCOUNTER — Other Ambulatory Visit (HOSPITAL_BASED_OUTPATIENT_CLINIC_OR_DEPARTMENT_OTHER): Payer: Self-pay

## 2023-07-15 DIAGNOSIS — N186 End stage renal disease: Secondary | ICD-10-CM | POA: Diagnosis not present

## 2023-07-15 DIAGNOSIS — D631 Anemia in chronic kidney disease: Secondary | ICD-10-CM | POA: Diagnosis not present

## 2023-07-15 DIAGNOSIS — N2581 Secondary hyperparathyroidism of renal origin: Secondary | ICD-10-CM | POA: Diagnosis not present

## 2023-07-15 DIAGNOSIS — Z992 Dependence on renal dialysis: Secondary | ICD-10-CM | POA: Diagnosis not present

## 2023-07-16 DIAGNOSIS — N2581 Secondary hyperparathyroidism of renal origin: Secondary | ICD-10-CM | POA: Diagnosis not present

## 2023-07-16 DIAGNOSIS — N186 End stage renal disease: Secondary | ICD-10-CM | POA: Diagnosis not present

## 2023-07-16 DIAGNOSIS — D631 Anemia in chronic kidney disease: Secondary | ICD-10-CM | POA: Diagnosis not present

## 2023-07-16 DIAGNOSIS — Z992 Dependence on renal dialysis: Secondary | ICD-10-CM | POA: Diagnosis not present

## 2023-07-17 DIAGNOSIS — N2581 Secondary hyperparathyroidism of renal origin: Secondary | ICD-10-CM | POA: Diagnosis not present

## 2023-07-17 DIAGNOSIS — Z992 Dependence on renal dialysis: Secondary | ICD-10-CM | POA: Diagnosis not present

## 2023-07-17 DIAGNOSIS — N186 End stage renal disease: Secondary | ICD-10-CM | POA: Diagnosis not present

## 2023-07-17 DIAGNOSIS — D631 Anemia in chronic kidney disease: Secondary | ICD-10-CM | POA: Diagnosis not present

## 2023-07-18 DIAGNOSIS — N186 End stage renal disease: Secondary | ICD-10-CM | POA: Diagnosis not present

## 2023-07-18 DIAGNOSIS — N2581 Secondary hyperparathyroidism of renal origin: Secondary | ICD-10-CM | POA: Diagnosis not present

## 2023-07-18 DIAGNOSIS — D631 Anemia in chronic kidney disease: Secondary | ICD-10-CM | POA: Diagnosis not present

## 2023-07-18 DIAGNOSIS — Z992 Dependence on renal dialysis: Secondary | ICD-10-CM | POA: Diagnosis not present

## 2023-07-19 DIAGNOSIS — D631 Anemia in chronic kidney disease: Secondary | ICD-10-CM | POA: Diagnosis not present

## 2023-07-19 DIAGNOSIS — Z992 Dependence on renal dialysis: Secondary | ICD-10-CM | POA: Diagnosis not present

## 2023-07-19 DIAGNOSIS — N2581 Secondary hyperparathyroidism of renal origin: Secondary | ICD-10-CM | POA: Diagnosis not present

## 2023-07-19 DIAGNOSIS — N186 End stage renal disease: Secondary | ICD-10-CM | POA: Diagnosis not present

## 2023-07-20 DIAGNOSIS — N2581 Secondary hyperparathyroidism of renal origin: Secondary | ICD-10-CM | POA: Diagnosis not present

## 2023-07-20 DIAGNOSIS — N186 End stage renal disease: Secondary | ICD-10-CM | POA: Diagnosis not present

## 2023-07-20 DIAGNOSIS — D631 Anemia in chronic kidney disease: Secondary | ICD-10-CM | POA: Diagnosis not present

## 2023-07-20 DIAGNOSIS — Z992 Dependence on renal dialysis: Secondary | ICD-10-CM | POA: Diagnosis not present

## 2023-07-21 ENCOUNTER — Other Ambulatory Visit (HOSPITAL_BASED_OUTPATIENT_CLINIC_OR_DEPARTMENT_OTHER): Payer: Self-pay

## 2023-07-21 DIAGNOSIS — Z992 Dependence on renal dialysis: Secondary | ICD-10-CM | POA: Diagnosis not present

## 2023-07-21 DIAGNOSIS — N186 End stage renal disease: Secondary | ICD-10-CM | POA: Diagnosis not present

## 2023-07-21 DIAGNOSIS — N2581 Secondary hyperparathyroidism of renal origin: Secondary | ICD-10-CM | POA: Diagnosis not present

## 2023-07-21 DIAGNOSIS — D631 Anemia in chronic kidney disease: Secondary | ICD-10-CM | POA: Diagnosis not present

## 2023-07-22 DIAGNOSIS — N2581 Secondary hyperparathyroidism of renal origin: Secondary | ICD-10-CM | POA: Diagnosis not present

## 2023-07-22 DIAGNOSIS — N186 End stage renal disease: Secondary | ICD-10-CM | POA: Diagnosis not present

## 2023-07-22 DIAGNOSIS — D631 Anemia in chronic kidney disease: Secondary | ICD-10-CM | POA: Diagnosis not present

## 2023-07-22 DIAGNOSIS — Z992 Dependence on renal dialysis: Secondary | ICD-10-CM | POA: Diagnosis not present

## 2023-07-23 DIAGNOSIS — N186 End stage renal disease: Secondary | ICD-10-CM | POA: Diagnosis not present

## 2023-07-23 DIAGNOSIS — Z992 Dependence on renal dialysis: Secondary | ICD-10-CM | POA: Diagnosis not present

## 2023-07-23 DIAGNOSIS — N2581 Secondary hyperparathyroidism of renal origin: Secondary | ICD-10-CM | POA: Diagnosis not present

## 2023-07-23 DIAGNOSIS — D631 Anemia in chronic kidney disease: Secondary | ICD-10-CM | POA: Diagnosis not present

## 2023-07-24 DIAGNOSIS — N186 End stage renal disease: Secondary | ICD-10-CM | POA: Diagnosis not present

## 2023-07-24 DIAGNOSIS — Z992 Dependence on renal dialysis: Secondary | ICD-10-CM | POA: Diagnosis not present

## 2023-07-24 DIAGNOSIS — D631 Anemia in chronic kidney disease: Secondary | ICD-10-CM | POA: Diagnosis not present

## 2023-07-24 DIAGNOSIS — N2581 Secondary hyperparathyroidism of renal origin: Secondary | ICD-10-CM | POA: Diagnosis not present

## 2023-07-25 DIAGNOSIS — N186 End stage renal disease: Secondary | ICD-10-CM | POA: Diagnosis not present

## 2023-07-25 DIAGNOSIS — D631 Anemia in chronic kidney disease: Secondary | ICD-10-CM | POA: Diagnosis not present

## 2023-07-25 DIAGNOSIS — Z992 Dependence on renal dialysis: Secondary | ICD-10-CM | POA: Diagnosis not present

## 2023-07-25 DIAGNOSIS — N2581 Secondary hyperparathyroidism of renal origin: Secondary | ICD-10-CM | POA: Diagnosis not present

## 2023-07-26 DIAGNOSIS — I371 Nonrheumatic pulmonary valve insufficiency: Secondary | ICD-10-CM | POA: Diagnosis not present

## 2023-07-26 DIAGNOSIS — D631 Anemia in chronic kidney disease: Secondary | ICD-10-CM | POA: Diagnosis not present

## 2023-07-26 DIAGNOSIS — Z79899 Other long term (current) drug therapy: Secondary | ICD-10-CM | POA: Diagnosis not present

## 2023-07-26 DIAGNOSIS — N186 End stage renal disease: Secondary | ICD-10-CM | POA: Diagnosis not present

## 2023-07-26 DIAGNOSIS — Z992 Dependence on renal dialysis: Secondary | ICD-10-CM | POA: Diagnosis not present

## 2023-07-26 DIAGNOSIS — I10 Essential (primary) hypertension: Secondary | ICD-10-CM | POA: Diagnosis not present

## 2023-07-26 DIAGNOSIS — N2581 Secondary hyperparathyroidism of renal origin: Secondary | ICD-10-CM | POA: Diagnosis not present

## 2023-07-27 DIAGNOSIS — D631 Anemia in chronic kidney disease: Secondary | ICD-10-CM | POA: Diagnosis not present

## 2023-07-27 DIAGNOSIS — N186 End stage renal disease: Secondary | ICD-10-CM | POA: Diagnosis not present

## 2023-07-27 DIAGNOSIS — N2581 Secondary hyperparathyroidism of renal origin: Secondary | ICD-10-CM | POA: Diagnosis not present

## 2023-07-27 DIAGNOSIS — Z992 Dependence on renal dialysis: Secondary | ICD-10-CM | POA: Diagnosis not present

## 2023-07-28 DIAGNOSIS — D631 Anemia in chronic kidney disease: Secondary | ICD-10-CM | POA: Diagnosis not present

## 2023-07-28 DIAGNOSIS — N186 End stage renal disease: Secondary | ICD-10-CM | POA: Diagnosis not present

## 2023-07-28 DIAGNOSIS — Z992 Dependence on renal dialysis: Secondary | ICD-10-CM | POA: Diagnosis not present

## 2023-07-28 DIAGNOSIS — N2581 Secondary hyperparathyroidism of renal origin: Secondary | ICD-10-CM | POA: Diagnosis not present

## 2023-07-29 DIAGNOSIS — N2581 Secondary hyperparathyroidism of renal origin: Secondary | ICD-10-CM | POA: Diagnosis not present

## 2023-07-29 DIAGNOSIS — Z992 Dependence on renal dialysis: Secondary | ICD-10-CM | POA: Diagnosis not present

## 2023-07-29 DIAGNOSIS — N186 End stage renal disease: Secondary | ICD-10-CM | POA: Diagnosis not present

## 2023-07-29 DIAGNOSIS — D631 Anemia in chronic kidney disease: Secondary | ICD-10-CM | POA: Diagnosis not present

## 2023-07-30 DIAGNOSIS — D631 Anemia in chronic kidney disease: Secondary | ICD-10-CM | POA: Diagnosis not present

## 2023-07-30 DIAGNOSIS — N2581 Secondary hyperparathyroidism of renal origin: Secondary | ICD-10-CM | POA: Diagnosis not present

## 2023-07-30 DIAGNOSIS — N186 End stage renal disease: Secondary | ICD-10-CM | POA: Diagnosis not present

## 2023-07-30 DIAGNOSIS — Z992 Dependence on renal dialysis: Secondary | ICD-10-CM | POA: Diagnosis not present

## 2023-07-31 ENCOUNTER — Other Ambulatory Visit (HOSPITAL_BASED_OUTPATIENT_CLINIC_OR_DEPARTMENT_OTHER): Payer: Self-pay

## 2023-07-31 DIAGNOSIS — Z992 Dependence on renal dialysis: Secondary | ICD-10-CM | POA: Diagnosis not present

## 2023-07-31 DIAGNOSIS — N2581 Secondary hyperparathyroidism of renal origin: Secondary | ICD-10-CM | POA: Diagnosis not present

## 2023-07-31 DIAGNOSIS — N186 End stage renal disease: Secondary | ICD-10-CM | POA: Diagnosis not present

## 2023-07-31 DIAGNOSIS — D631 Anemia in chronic kidney disease: Secondary | ICD-10-CM | POA: Diagnosis not present

## 2023-07-31 MED ORDER — SPIRONOLACTONE 25 MG PO TABS
25.0000 mg | ORAL_TABLET | Freq: Every day | ORAL | 3 refills | Status: AC
  Filled 2023-07-31: qty 90, 90d supply, fill #0
  Filled 2023-11-03 – 2023-11-13 (×2): qty 90, 90d supply, fill #1
  Filled 2024-02-15: qty 90, 90d supply, fill #2
  Filled 2024-05-30: qty 90, 90d supply, fill #3

## 2023-08-01 DIAGNOSIS — E1122 Type 2 diabetes mellitus with diabetic chronic kidney disease: Secondary | ICD-10-CM | POA: Diagnosis not present

## 2023-08-01 DIAGNOSIS — I10 Essential (primary) hypertension: Secondary | ICD-10-CM | POA: Diagnosis not present

## 2023-08-01 DIAGNOSIS — N2581 Secondary hyperparathyroidism of renal origin: Secondary | ICD-10-CM | POA: Diagnosis not present

## 2023-08-01 DIAGNOSIS — D696 Thrombocytopenia, unspecified: Secondary | ICD-10-CM | POA: Diagnosis not present

## 2023-08-01 DIAGNOSIS — N186 End stage renal disease: Secondary | ICD-10-CM | POA: Diagnosis not present

## 2023-08-01 DIAGNOSIS — G4733 Obstructive sleep apnea (adult) (pediatric): Secondary | ICD-10-CM | POA: Diagnosis not present

## 2023-08-01 DIAGNOSIS — D631 Anemia in chronic kidney disease: Secondary | ICD-10-CM | POA: Diagnosis not present

## 2023-08-01 DIAGNOSIS — Z992 Dependence on renal dialysis: Secondary | ICD-10-CM | POA: Diagnosis not present

## 2023-08-01 DIAGNOSIS — E7800 Pure hypercholesterolemia, unspecified: Secondary | ICD-10-CM | POA: Diagnosis not present

## 2023-08-01 DIAGNOSIS — Z6841 Body Mass Index (BMI) 40.0 and over, adult: Secondary | ICD-10-CM | POA: Diagnosis not present

## 2023-08-02 ENCOUNTER — Other Ambulatory Visit (HOSPITAL_BASED_OUTPATIENT_CLINIC_OR_DEPARTMENT_OTHER): Payer: Self-pay

## 2023-08-02 DIAGNOSIS — Z992 Dependence on renal dialysis: Secondary | ICD-10-CM | POA: Diagnosis not present

## 2023-08-02 DIAGNOSIS — N2581 Secondary hyperparathyroidism of renal origin: Secondary | ICD-10-CM | POA: Diagnosis not present

## 2023-08-02 DIAGNOSIS — D631 Anemia in chronic kidney disease: Secondary | ICD-10-CM | POA: Diagnosis not present

## 2023-08-02 DIAGNOSIS — N186 End stage renal disease: Secondary | ICD-10-CM | POA: Diagnosis not present

## 2023-08-02 MED ORDER — MOUNJARO 7.5 MG/0.5ML ~~LOC~~ SOAJ
7.5000 mg | SUBCUTANEOUS | 0 refills | Status: DC
  Filled 2023-08-02: qty 2, 28d supply, fill #0

## 2023-08-03 DIAGNOSIS — N186 End stage renal disease: Secondary | ICD-10-CM | POA: Diagnosis not present

## 2023-08-03 DIAGNOSIS — Z992 Dependence on renal dialysis: Secondary | ICD-10-CM | POA: Diagnosis not present

## 2023-08-03 DIAGNOSIS — D631 Anemia in chronic kidney disease: Secondary | ICD-10-CM | POA: Diagnosis not present

## 2023-08-03 DIAGNOSIS — N2581 Secondary hyperparathyroidism of renal origin: Secondary | ICD-10-CM | POA: Diagnosis not present

## 2023-08-04 DIAGNOSIS — N2581 Secondary hyperparathyroidism of renal origin: Secondary | ICD-10-CM | POA: Diagnosis not present

## 2023-08-04 DIAGNOSIS — N186 End stage renal disease: Secondary | ICD-10-CM | POA: Diagnosis not present

## 2023-08-04 DIAGNOSIS — Z992 Dependence on renal dialysis: Secondary | ICD-10-CM | POA: Diagnosis not present

## 2023-08-04 DIAGNOSIS — D631 Anemia in chronic kidney disease: Secondary | ICD-10-CM | POA: Diagnosis not present

## 2023-08-05 DIAGNOSIS — N186 End stage renal disease: Secondary | ICD-10-CM | POA: Diagnosis not present

## 2023-08-05 DIAGNOSIS — D631 Anemia in chronic kidney disease: Secondary | ICD-10-CM | POA: Diagnosis not present

## 2023-08-05 DIAGNOSIS — N2581 Secondary hyperparathyroidism of renal origin: Secondary | ICD-10-CM | POA: Diagnosis not present

## 2023-08-05 DIAGNOSIS — Z992 Dependence on renal dialysis: Secondary | ICD-10-CM | POA: Diagnosis not present

## 2023-08-06 DIAGNOSIS — Z992 Dependence on renal dialysis: Secondary | ICD-10-CM | POA: Diagnosis not present

## 2023-08-06 DIAGNOSIS — D631 Anemia in chronic kidney disease: Secondary | ICD-10-CM | POA: Diagnosis not present

## 2023-08-06 DIAGNOSIS — N186 End stage renal disease: Secondary | ICD-10-CM | POA: Diagnosis not present

## 2023-08-06 DIAGNOSIS — N2581 Secondary hyperparathyroidism of renal origin: Secondary | ICD-10-CM | POA: Diagnosis not present

## 2023-08-07 DIAGNOSIS — N2581 Secondary hyperparathyroidism of renal origin: Secondary | ICD-10-CM | POA: Diagnosis not present

## 2023-08-07 DIAGNOSIS — N186 End stage renal disease: Secondary | ICD-10-CM | POA: Diagnosis not present

## 2023-08-07 DIAGNOSIS — D631 Anemia in chronic kidney disease: Secondary | ICD-10-CM | POA: Diagnosis not present

## 2023-08-07 DIAGNOSIS — Z992 Dependence on renal dialysis: Secondary | ICD-10-CM | POA: Diagnosis not present

## 2023-08-08 DIAGNOSIS — N186 End stage renal disease: Secondary | ICD-10-CM | POA: Diagnosis not present

## 2023-08-08 DIAGNOSIS — Z992 Dependence on renal dialysis: Secondary | ICD-10-CM | POA: Diagnosis not present

## 2023-08-08 DIAGNOSIS — N2581 Secondary hyperparathyroidism of renal origin: Secondary | ICD-10-CM | POA: Diagnosis not present

## 2023-08-08 DIAGNOSIS — D631 Anemia in chronic kidney disease: Secondary | ICD-10-CM | POA: Diagnosis not present

## 2023-08-09 DIAGNOSIS — D631 Anemia in chronic kidney disease: Secondary | ICD-10-CM | POA: Diagnosis not present

## 2023-08-09 DIAGNOSIS — Z992 Dependence on renal dialysis: Secondary | ICD-10-CM | POA: Diagnosis not present

## 2023-08-09 DIAGNOSIS — N2581 Secondary hyperparathyroidism of renal origin: Secondary | ICD-10-CM | POA: Diagnosis not present

## 2023-08-09 DIAGNOSIS — N186 End stage renal disease: Secondary | ICD-10-CM | POA: Diagnosis not present

## 2023-08-10 DIAGNOSIS — N189 Chronic kidney disease, unspecified: Secondary | ICD-10-CM | POA: Diagnosis not present

## 2023-08-10 DIAGNOSIS — N2581 Secondary hyperparathyroidism of renal origin: Secondary | ICD-10-CM | POA: Diagnosis not present

## 2023-08-10 DIAGNOSIS — Z992 Dependence on renal dialysis: Secondary | ICD-10-CM | POA: Diagnosis not present

## 2023-08-10 DIAGNOSIS — N186 End stage renal disease: Secondary | ICD-10-CM | POA: Diagnosis not present

## 2023-08-10 DIAGNOSIS — D631 Anemia in chronic kidney disease: Secondary | ICD-10-CM | POA: Diagnosis not present

## 2023-08-11 DIAGNOSIS — Z992 Dependence on renal dialysis: Secondary | ICD-10-CM | POA: Diagnosis not present

## 2023-08-11 DIAGNOSIS — N186 End stage renal disease: Secondary | ICD-10-CM | POA: Diagnosis not present

## 2023-08-11 DIAGNOSIS — D631 Anemia in chronic kidney disease: Secondary | ICD-10-CM | POA: Diagnosis not present

## 2023-08-11 DIAGNOSIS — N2581 Secondary hyperparathyroidism of renal origin: Secondary | ICD-10-CM | POA: Diagnosis not present

## 2023-08-12 DIAGNOSIS — D631 Anemia in chronic kidney disease: Secondary | ICD-10-CM | POA: Diagnosis not present

## 2023-08-12 DIAGNOSIS — N186 End stage renal disease: Secondary | ICD-10-CM | POA: Diagnosis not present

## 2023-08-12 DIAGNOSIS — N2581 Secondary hyperparathyroidism of renal origin: Secondary | ICD-10-CM | POA: Diagnosis not present

## 2023-08-12 DIAGNOSIS — Z992 Dependence on renal dialysis: Secondary | ICD-10-CM | POA: Diagnosis not present

## 2023-08-13 DIAGNOSIS — N2581 Secondary hyperparathyroidism of renal origin: Secondary | ICD-10-CM | POA: Diagnosis not present

## 2023-08-13 DIAGNOSIS — N186 End stage renal disease: Secondary | ICD-10-CM | POA: Diagnosis not present

## 2023-08-13 DIAGNOSIS — D631 Anemia in chronic kidney disease: Secondary | ICD-10-CM | POA: Diagnosis not present

## 2023-08-13 DIAGNOSIS — Z992 Dependence on renal dialysis: Secondary | ICD-10-CM | POA: Diagnosis not present

## 2023-08-14 DIAGNOSIS — N186 End stage renal disease: Secondary | ICD-10-CM | POA: Diagnosis not present

## 2023-08-14 DIAGNOSIS — Z992 Dependence on renal dialysis: Secondary | ICD-10-CM | POA: Diagnosis not present

## 2023-08-14 DIAGNOSIS — D631 Anemia in chronic kidney disease: Secondary | ICD-10-CM | POA: Diagnosis not present

## 2023-08-14 DIAGNOSIS — E1122 Type 2 diabetes mellitus with diabetic chronic kidney disease: Secondary | ICD-10-CM | POA: Diagnosis not present

## 2023-08-14 DIAGNOSIS — N2581 Secondary hyperparathyroidism of renal origin: Secondary | ICD-10-CM | POA: Diagnosis not present

## 2023-08-15 DIAGNOSIS — N2581 Secondary hyperparathyroidism of renal origin: Secondary | ICD-10-CM | POA: Diagnosis not present

## 2023-08-15 DIAGNOSIS — N186 End stage renal disease: Secondary | ICD-10-CM | POA: Diagnosis not present

## 2023-08-15 DIAGNOSIS — Z992 Dependence on renal dialysis: Secondary | ICD-10-CM | POA: Diagnosis not present

## 2023-08-15 DIAGNOSIS — Z4932 Encounter for adequacy testing for peritoneal dialysis: Secondary | ICD-10-CM | POA: Diagnosis not present

## 2023-08-15 DIAGNOSIS — D631 Anemia in chronic kidney disease: Secondary | ICD-10-CM | POA: Diagnosis not present

## 2023-08-16 DIAGNOSIS — Z992 Dependence on renal dialysis: Secondary | ICD-10-CM | POA: Diagnosis not present

## 2023-08-16 DIAGNOSIS — D631 Anemia in chronic kidney disease: Secondary | ICD-10-CM | POA: Diagnosis not present

## 2023-08-16 DIAGNOSIS — N2581 Secondary hyperparathyroidism of renal origin: Secondary | ICD-10-CM | POA: Diagnosis not present

## 2023-08-16 DIAGNOSIS — Z4932 Encounter for adequacy testing for peritoneal dialysis: Secondary | ICD-10-CM | POA: Diagnosis not present

## 2023-08-16 DIAGNOSIS — N186 End stage renal disease: Secondary | ICD-10-CM | POA: Diagnosis not present

## 2023-08-17 DIAGNOSIS — D649 Anemia, unspecified: Secondary | ICD-10-CM | POA: Diagnosis not present

## 2023-08-17 DIAGNOSIS — N429 Disorder of prostate, unspecified: Secondary | ICD-10-CM | POA: Diagnosis not present

## 2023-08-17 DIAGNOSIS — N186 End stage renal disease: Secondary | ICD-10-CM | POA: Diagnosis not present

## 2023-08-17 DIAGNOSIS — D519 Vitamin B12 deficiency anemia, unspecified: Secondary | ICD-10-CM | POA: Diagnosis not present

## 2023-08-17 DIAGNOSIS — Z992 Dependence on renal dialysis: Secondary | ICD-10-CM | POA: Diagnosis not present

## 2023-08-17 DIAGNOSIS — D631 Anemia in chronic kidney disease: Secondary | ICD-10-CM | POA: Diagnosis not present

## 2023-08-17 DIAGNOSIS — E038 Other specified hypothyroidism: Secondary | ICD-10-CM | POA: Diagnosis not present

## 2023-08-17 DIAGNOSIS — E279 Disorder of adrenal gland, unspecified: Secondary | ICD-10-CM | POA: Diagnosis not present

## 2023-08-17 DIAGNOSIS — Z4932 Encounter for adequacy testing for peritoneal dialysis: Secondary | ICD-10-CM | POA: Diagnosis not present

## 2023-08-17 DIAGNOSIS — E559 Vitamin D deficiency, unspecified: Secondary | ICD-10-CM | POA: Diagnosis not present

## 2023-08-17 DIAGNOSIS — N2581 Secondary hyperparathyroidism of renal origin: Secondary | ICD-10-CM | POA: Diagnosis not present

## 2023-08-17 DIAGNOSIS — D509 Iron deficiency anemia, unspecified: Secondary | ICD-10-CM | POA: Diagnosis not present

## 2023-08-18 DIAGNOSIS — Z992 Dependence on renal dialysis: Secondary | ICD-10-CM | POA: Diagnosis not present

## 2023-08-18 DIAGNOSIS — N186 End stage renal disease: Secondary | ICD-10-CM | POA: Diagnosis not present

## 2023-08-18 DIAGNOSIS — D631 Anemia in chronic kidney disease: Secondary | ICD-10-CM | POA: Diagnosis not present

## 2023-08-18 DIAGNOSIS — N2581 Secondary hyperparathyroidism of renal origin: Secondary | ICD-10-CM | POA: Diagnosis not present

## 2023-08-18 DIAGNOSIS — Z4932 Encounter for adequacy testing for peritoneal dialysis: Secondary | ICD-10-CM | POA: Diagnosis not present

## 2023-08-19 DIAGNOSIS — Z992 Dependence on renal dialysis: Secondary | ICD-10-CM | POA: Diagnosis not present

## 2023-08-19 DIAGNOSIS — D631 Anemia in chronic kidney disease: Secondary | ICD-10-CM | POA: Diagnosis not present

## 2023-08-19 DIAGNOSIS — N2581 Secondary hyperparathyroidism of renal origin: Secondary | ICD-10-CM | POA: Diagnosis not present

## 2023-08-19 DIAGNOSIS — Z4932 Encounter for adequacy testing for peritoneal dialysis: Secondary | ICD-10-CM | POA: Diagnosis not present

## 2023-08-19 DIAGNOSIS — N186 End stage renal disease: Secondary | ICD-10-CM | POA: Diagnosis not present

## 2023-08-20 DIAGNOSIS — Z4932 Encounter for adequacy testing for peritoneal dialysis: Secondary | ICD-10-CM | POA: Diagnosis not present

## 2023-08-20 DIAGNOSIS — D631 Anemia in chronic kidney disease: Secondary | ICD-10-CM | POA: Diagnosis not present

## 2023-08-20 DIAGNOSIS — Z992 Dependence on renal dialysis: Secondary | ICD-10-CM | POA: Diagnosis not present

## 2023-08-20 DIAGNOSIS — N2581 Secondary hyperparathyroidism of renal origin: Secondary | ICD-10-CM | POA: Diagnosis not present

## 2023-08-20 DIAGNOSIS — N186 End stage renal disease: Secondary | ICD-10-CM | POA: Diagnosis not present

## 2023-08-21 DIAGNOSIS — Z4932 Encounter for adequacy testing for peritoneal dialysis: Secondary | ICD-10-CM | POA: Diagnosis not present

## 2023-08-21 DIAGNOSIS — N186 End stage renal disease: Secondary | ICD-10-CM | POA: Diagnosis not present

## 2023-08-21 DIAGNOSIS — Z992 Dependence on renal dialysis: Secondary | ICD-10-CM | POA: Diagnosis not present

## 2023-08-21 DIAGNOSIS — D631 Anemia in chronic kidney disease: Secondary | ICD-10-CM | POA: Diagnosis not present

## 2023-08-21 DIAGNOSIS — N2581 Secondary hyperparathyroidism of renal origin: Secondary | ICD-10-CM | POA: Diagnosis not present

## 2023-08-22 DIAGNOSIS — N186 End stage renal disease: Secondary | ICD-10-CM | POA: Diagnosis not present

## 2023-08-22 DIAGNOSIS — Z4932 Encounter for adequacy testing for peritoneal dialysis: Secondary | ICD-10-CM | POA: Diagnosis not present

## 2023-08-22 DIAGNOSIS — N2581 Secondary hyperparathyroidism of renal origin: Secondary | ICD-10-CM | POA: Diagnosis not present

## 2023-08-22 DIAGNOSIS — D631 Anemia in chronic kidney disease: Secondary | ICD-10-CM | POA: Diagnosis not present

## 2023-08-22 DIAGNOSIS — Z992 Dependence on renal dialysis: Secondary | ICD-10-CM | POA: Diagnosis not present

## 2023-08-23 DIAGNOSIS — Z992 Dependence on renal dialysis: Secondary | ICD-10-CM | POA: Diagnosis not present

## 2023-08-23 DIAGNOSIS — Z4932 Encounter for adequacy testing for peritoneal dialysis: Secondary | ICD-10-CM | POA: Diagnosis not present

## 2023-08-23 DIAGNOSIS — N2581 Secondary hyperparathyroidism of renal origin: Secondary | ICD-10-CM | POA: Diagnosis not present

## 2023-08-23 DIAGNOSIS — N186 End stage renal disease: Secondary | ICD-10-CM | POA: Diagnosis not present

## 2023-08-23 DIAGNOSIS — D631 Anemia in chronic kidney disease: Secondary | ICD-10-CM | POA: Diagnosis not present

## 2023-08-24 DIAGNOSIS — D631 Anemia in chronic kidney disease: Secondary | ICD-10-CM | POA: Diagnosis not present

## 2023-08-24 DIAGNOSIS — N186 End stage renal disease: Secondary | ICD-10-CM | POA: Diagnosis not present

## 2023-08-24 DIAGNOSIS — N2581 Secondary hyperparathyroidism of renal origin: Secondary | ICD-10-CM | POA: Diagnosis not present

## 2023-08-24 DIAGNOSIS — Z992 Dependence on renal dialysis: Secondary | ICD-10-CM | POA: Diagnosis not present

## 2023-08-24 DIAGNOSIS — Z4932 Encounter for adequacy testing for peritoneal dialysis: Secondary | ICD-10-CM | POA: Diagnosis not present

## 2023-08-25 DIAGNOSIS — N186 End stage renal disease: Secondary | ICD-10-CM | POA: Diagnosis not present

## 2023-08-25 DIAGNOSIS — N2581 Secondary hyperparathyroidism of renal origin: Secondary | ICD-10-CM | POA: Diagnosis not present

## 2023-08-25 DIAGNOSIS — Z992 Dependence on renal dialysis: Secondary | ICD-10-CM | POA: Diagnosis not present

## 2023-08-25 DIAGNOSIS — Z4932 Encounter for adequacy testing for peritoneal dialysis: Secondary | ICD-10-CM | POA: Diagnosis not present

## 2023-08-25 DIAGNOSIS — D631 Anemia in chronic kidney disease: Secondary | ICD-10-CM | POA: Diagnosis not present

## 2023-08-26 DIAGNOSIS — N186 End stage renal disease: Secondary | ICD-10-CM | POA: Diagnosis not present

## 2023-08-26 DIAGNOSIS — N2581 Secondary hyperparathyroidism of renal origin: Secondary | ICD-10-CM | POA: Diagnosis not present

## 2023-08-26 DIAGNOSIS — Z992 Dependence on renal dialysis: Secondary | ICD-10-CM | POA: Diagnosis not present

## 2023-08-26 DIAGNOSIS — Z4932 Encounter for adequacy testing for peritoneal dialysis: Secondary | ICD-10-CM | POA: Diagnosis not present

## 2023-08-26 DIAGNOSIS — D631 Anemia in chronic kidney disease: Secondary | ICD-10-CM | POA: Diagnosis not present

## 2023-08-27 DIAGNOSIS — D631 Anemia in chronic kidney disease: Secondary | ICD-10-CM | POA: Diagnosis not present

## 2023-08-27 DIAGNOSIS — N186 End stage renal disease: Secondary | ICD-10-CM | POA: Diagnosis not present

## 2023-08-27 DIAGNOSIS — Z992 Dependence on renal dialysis: Secondary | ICD-10-CM | POA: Diagnosis not present

## 2023-08-27 DIAGNOSIS — N2581 Secondary hyperparathyroidism of renal origin: Secondary | ICD-10-CM | POA: Diagnosis not present

## 2023-08-27 DIAGNOSIS — Z4932 Encounter for adequacy testing for peritoneal dialysis: Secondary | ICD-10-CM | POA: Diagnosis not present

## 2023-08-28 ENCOUNTER — Other Ambulatory Visit (HOSPITAL_BASED_OUTPATIENT_CLINIC_OR_DEPARTMENT_OTHER): Payer: Self-pay

## 2023-08-28 DIAGNOSIS — N2581 Secondary hyperparathyroidism of renal origin: Secondary | ICD-10-CM | POA: Diagnosis not present

## 2023-08-28 DIAGNOSIS — N186 End stage renal disease: Secondary | ICD-10-CM | POA: Diagnosis not present

## 2023-08-28 DIAGNOSIS — Z4932 Encounter for adequacy testing for peritoneal dialysis: Secondary | ICD-10-CM | POA: Diagnosis not present

## 2023-08-28 DIAGNOSIS — Z992 Dependence on renal dialysis: Secondary | ICD-10-CM | POA: Diagnosis not present

## 2023-08-28 DIAGNOSIS — D631 Anemia in chronic kidney disease: Secondary | ICD-10-CM | POA: Diagnosis not present

## 2023-08-28 MED ORDER — TRAZODONE HCL 50 MG PO TABS
200.0000 mg | ORAL_TABLET | Freq: Every day | ORAL | 2 refills | Status: DC
  Filled 2023-08-28: qty 360, 90d supply, fill #0
  Filled 2023-11-28: qty 360, 90d supply, fill #1
  Filled 2024-03-01: qty 360, 90d supply, fill #2
  Filled 2024-03-02: qty 120, 30d supply, fill #2
  Filled 2024-03-29: qty 120, 30d supply, fill #3
  Filled 2024-04-01: qty 240, 60d supply, fill #3

## 2023-08-29 ENCOUNTER — Other Ambulatory Visit (HOSPITAL_BASED_OUTPATIENT_CLINIC_OR_DEPARTMENT_OTHER): Payer: Self-pay

## 2023-08-29 DIAGNOSIS — D631 Anemia in chronic kidney disease: Secondary | ICD-10-CM | POA: Diagnosis not present

## 2023-08-29 DIAGNOSIS — I10 Essential (primary) hypertension: Secondary | ICD-10-CM | POA: Diagnosis not present

## 2023-08-29 DIAGNOSIS — N2581 Secondary hyperparathyroidism of renal origin: Secondary | ICD-10-CM | POA: Diagnosis not present

## 2023-08-29 DIAGNOSIS — E1122 Type 2 diabetes mellitus with diabetic chronic kidney disease: Secondary | ICD-10-CM | POA: Diagnosis not present

## 2023-08-29 DIAGNOSIS — Z4932 Encounter for adequacy testing for peritoneal dialysis: Secondary | ICD-10-CM | POA: Diagnosis not present

## 2023-08-29 DIAGNOSIS — Z992 Dependence on renal dialysis: Secondary | ICD-10-CM | POA: Diagnosis not present

## 2023-08-29 DIAGNOSIS — N186 End stage renal disease: Secondary | ICD-10-CM | POA: Diagnosis not present

## 2023-08-29 MED ORDER — ONDANSETRON 4 MG PO TBDP
4.0000 mg | ORAL_TABLET | Freq: Four times a day (QID) | ORAL | 0 refills | Status: DC | PRN
  Filled 2023-08-29: qty 20, 5d supply, fill #0

## 2023-08-30 DIAGNOSIS — N2581 Secondary hyperparathyroidism of renal origin: Secondary | ICD-10-CM | POA: Diagnosis not present

## 2023-08-30 DIAGNOSIS — D631 Anemia in chronic kidney disease: Secondary | ICD-10-CM | POA: Diagnosis not present

## 2023-08-30 DIAGNOSIS — Z4932 Encounter for adequacy testing for peritoneal dialysis: Secondary | ICD-10-CM | POA: Diagnosis not present

## 2023-08-30 DIAGNOSIS — N186 End stage renal disease: Secondary | ICD-10-CM | POA: Diagnosis not present

## 2023-08-30 DIAGNOSIS — Z992 Dependence on renal dialysis: Secondary | ICD-10-CM | POA: Diagnosis not present

## 2023-08-31 DIAGNOSIS — Z4932 Encounter for adequacy testing for peritoneal dialysis: Secondary | ICD-10-CM | POA: Diagnosis not present

## 2023-08-31 DIAGNOSIS — D631 Anemia in chronic kidney disease: Secondary | ICD-10-CM | POA: Diagnosis not present

## 2023-08-31 DIAGNOSIS — Z992 Dependence on renal dialysis: Secondary | ICD-10-CM | POA: Diagnosis not present

## 2023-08-31 DIAGNOSIS — N2581 Secondary hyperparathyroidism of renal origin: Secondary | ICD-10-CM | POA: Diagnosis not present

## 2023-08-31 DIAGNOSIS — N186 End stage renal disease: Secondary | ICD-10-CM | POA: Diagnosis not present

## 2023-09-01 DIAGNOSIS — N186 End stage renal disease: Secondary | ICD-10-CM | POA: Diagnosis not present

## 2023-09-01 DIAGNOSIS — N2581 Secondary hyperparathyroidism of renal origin: Secondary | ICD-10-CM | POA: Diagnosis not present

## 2023-09-01 DIAGNOSIS — Z4932 Encounter for adequacy testing for peritoneal dialysis: Secondary | ICD-10-CM | POA: Diagnosis not present

## 2023-09-01 DIAGNOSIS — Z992 Dependence on renal dialysis: Secondary | ICD-10-CM | POA: Diagnosis not present

## 2023-09-01 DIAGNOSIS — D631 Anemia in chronic kidney disease: Secondary | ICD-10-CM | POA: Diagnosis not present

## 2023-09-02 DIAGNOSIS — N186 End stage renal disease: Secondary | ICD-10-CM | POA: Diagnosis not present

## 2023-09-02 DIAGNOSIS — N2581 Secondary hyperparathyroidism of renal origin: Secondary | ICD-10-CM | POA: Diagnosis not present

## 2023-09-02 DIAGNOSIS — Z4932 Encounter for adequacy testing for peritoneal dialysis: Secondary | ICD-10-CM | POA: Diagnosis not present

## 2023-09-02 DIAGNOSIS — D631 Anemia in chronic kidney disease: Secondary | ICD-10-CM | POA: Diagnosis not present

## 2023-09-02 DIAGNOSIS — Z992 Dependence on renal dialysis: Secondary | ICD-10-CM | POA: Diagnosis not present

## 2023-09-03 DIAGNOSIS — N2581 Secondary hyperparathyroidism of renal origin: Secondary | ICD-10-CM | POA: Diagnosis not present

## 2023-09-03 DIAGNOSIS — Z992 Dependence on renal dialysis: Secondary | ICD-10-CM | POA: Diagnosis not present

## 2023-09-03 DIAGNOSIS — N186 End stage renal disease: Secondary | ICD-10-CM | POA: Diagnosis not present

## 2023-09-03 DIAGNOSIS — Z4932 Encounter for adequacy testing for peritoneal dialysis: Secondary | ICD-10-CM | POA: Diagnosis not present

## 2023-09-03 DIAGNOSIS — D631 Anemia in chronic kidney disease: Secondary | ICD-10-CM | POA: Diagnosis not present

## 2023-09-04 DIAGNOSIS — N186 End stage renal disease: Secondary | ICD-10-CM | POA: Diagnosis not present

## 2023-09-04 DIAGNOSIS — Z4932 Encounter for adequacy testing for peritoneal dialysis: Secondary | ICD-10-CM | POA: Diagnosis not present

## 2023-09-04 DIAGNOSIS — D631 Anemia in chronic kidney disease: Secondary | ICD-10-CM | POA: Diagnosis not present

## 2023-09-04 DIAGNOSIS — Z992 Dependence on renal dialysis: Secondary | ICD-10-CM | POA: Diagnosis not present

## 2023-09-04 DIAGNOSIS — N2581 Secondary hyperparathyroidism of renal origin: Secondary | ICD-10-CM | POA: Diagnosis not present

## 2023-09-05 ENCOUNTER — Other Ambulatory Visit (HOSPITAL_BASED_OUTPATIENT_CLINIC_OR_DEPARTMENT_OTHER): Payer: Self-pay

## 2023-09-05 DIAGNOSIS — D631 Anemia in chronic kidney disease: Secondary | ICD-10-CM | POA: Diagnosis not present

## 2023-09-05 DIAGNOSIS — N186 End stage renal disease: Secondary | ICD-10-CM | POA: Diagnosis not present

## 2023-09-05 DIAGNOSIS — I10 Essential (primary) hypertension: Secondary | ICD-10-CM | POA: Diagnosis not present

## 2023-09-05 DIAGNOSIS — Z4932 Encounter for adequacy testing for peritoneal dialysis: Secondary | ICD-10-CM | POA: Diagnosis not present

## 2023-09-05 DIAGNOSIS — N2581 Secondary hyperparathyroidism of renal origin: Secondary | ICD-10-CM | POA: Diagnosis not present

## 2023-09-05 DIAGNOSIS — Z6841 Body Mass Index (BMI) 40.0 and over, adult: Secondary | ICD-10-CM | POA: Diagnosis not present

## 2023-09-05 DIAGNOSIS — D696 Thrombocytopenia, unspecified: Secondary | ICD-10-CM | POA: Diagnosis not present

## 2023-09-05 DIAGNOSIS — E1122 Type 2 diabetes mellitus with diabetic chronic kidney disease: Secondary | ICD-10-CM | POA: Diagnosis not present

## 2023-09-05 DIAGNOSIS — E7800 Pure hypercholesterolemia, unspecified: Secondary | ICD-10-CM | POA: Diagnosis not present

## 2023-09-05 DIAGNOSIS — Z992 Dependence on renal dialysis: Secondary | ICD-10-CM | POA: Diagnosis not present

## 2023-09-05 MED ORDER — MOUNJARO 5 MG/0.5ML ~~LOC~~ SOAJ
5.0000 mg | SUBCUTANEOUS | 0 refills | Status: DC
  Filled 2023-09-05: qty 2, 28d supply, fill #0

## 2023-09-06 DIAGNOSIS — D631 Anemia in chronic kidney disease: Secondary | ICD-10-CM | POA: Diagnosis not present

## 2023-09-06 DIAGNOSIS — N186 End stage renal disease: Secondary | ICD-10-CM | POA: Diagnosis not present

## 2023-09-06 DIAGNOSIS — Z992 Dependence on renal dialysis: Secondary | ICD-10-CM | POA: Diagnosis not present

## 2023-09-06 DIAGNOSIS — Z4932 Encounter for adequacy testing for peritoneal dialysis: Secondary | ICD-10-CM | POA: Diagnosis not present

## 2023-09-06 DIAGNOSIS — N2581 Secondary hyperparathyroidism of renal origin: Secondary | ICD-10-CM | POA: Diagnosis not present

## 2023-09-07 DIAGNOSIS — N2581 Secondary hyperparathyroidism of renal origin: Secondary | ICD-10-CM | POA: Diagnosis not present

## 2023-09-07 DIAGNOSIS — D631 Anemia in chronic kidney disease: Secondary | ICD-10-CM | POA: Diagnosis not present

## 2023-09-07 DIAGNOSIS — N186 End stage renal disease: Secondary | ICD-10-CM | POA: Diagnosis not present

## 2023-09-07 DIAGNOSIS — Z4932 Encounter for adequacy testing for peritoneal dialysis: Secondary | ICD-10-CM | POA: Diagnosis not present

## 2023-09-07 DIAGNOSIS — Z992 Dependence on renal dialysis: Secondary | ICD-10-CM | POA: Diagnosis not present

## 2023-09-08 ENCOUNTER — Other Ambulatory Visit (HOSPITAL_BASED_OUTPATIENT_CLINIC_OR_DEPARTMENT_OTHER): Payer: Self-pay

## 2023-09-08 DIAGNOSIS — N186 End stage renal disease: Secondary | ICD-10-CM | POA: Diagnosis not present

## 2023-09-08 DIAGNOSIS — D631 Anemia in chronic kidney disease: Secondary | ICD-10-CM | POA: Diagnosis not present

## 2023-09-08 DIAGNOSIS — N2581 Secondary hyperparathyroidism of renal origin: Secondary | ICD-10-CM | POA: Diagnosis not present

## 2023-09-08 DIAGNOSIS — Z992 Dependence on renal dialysis: Secondary | ICD-10-CM | POA: Diagnosis not present

## 2023-09-08 DIAGNOSIS — Z4932 Encounter for adequacy testing for peritoneal dialysis: Secondary | ICD-10-CM | POA: Diagnosis not present

## 2023-09-08 MED ORDER — LACTULOSE 10 GM/15ML PO SOLN
40.0000 g | ORAL | 11 refills | Status: AC
  Filled 2023-09-08: qty 473, 30d supply, fill #0
  Filled 2024-05-17: qty 473, 30d supply, fill #1

## 2023-09-09 ENCOUNTER — Inpatient Hospital Stay (HOSPITAL_BASED_OUTPATIENT_CLINIC_OR_DEPARTMENT_OTHER)
Admission: EM | Admit: 2023-09-09 | Discharge: 2023-09-14 | DRG: 867 | Disposition: A | Discharge status: Home / Self Care | Attending: Internal Medicine | Admitting: Internal Medicine

## 2023-09-09 ENCOUNTER — Other Ambulatory Visit: Payer: Self-pay

## 2023-09-09 ENCOUNTER — Encounter (HOSPITAL_BASED_OUTPATIENT_CLINIC_OR_DEPARTMENT_OTHER): Payer: Self-pay

## 2023-09-09 ENCOUNTER — Emergency Department (HOSPITAL_BASED_OUTPATIENT_CLINIC_OR_DEPARTMENT_OTHER)

## 2023-09-09 DIAGNOSIS — J45909 Unspecified asthma, uncomplicated: Secondary | ICD-10-CM | POA: Diagnosis present

## 2023-09-09 DIAGNOSIS — Z8249 Family history of ischemic heart disease and other diseases of the circulatory system: Secondary | ICD-10-CM

## 2023-09-09 DIAGNOSIS — Z833 Family history of diabetes mellitus: Secondary | ICD-10-CM

## 2023-09-09 DIAGNOSIS — Z7985 Long-term (current) use of injectable non-insulin antidiabetic drugs: Secondary | ICD-10-CM | POA: Diagnosis not present

## 2023-09-09 DIAGNOSIS — E66813 Obesity, class 3: Secondary | ICD-10-CM | POA: Diagnosis not present

## 2023-09-09 DIAGNOSIS — E1122 Type 2 diabetes mellitus with diabetic chronic kidney disease: Secondary | ICD-10-CM | POA: Diagnosis not present

## 2023-09-09 DIAGNOSIS — R9431 Abnormal electrocardiogram [ECG] [EKG]: Secondary | ICD-10-CM | POA: Diagnosis not present

## 2023-09-09 DIAGNOSIS — Y841 Kidney dialysis as the cause of abnormal reaction of the patient, or of later complication, without mention of misadventure at the time of the procedure: Secondary | ICD-10-CM | POA: Diagnosis present

## 2023-09-09 DIAGNOSIS — R109 Unspecified abdominal pain: Secondary | ICD-10-CM | POA: Diagnosis present

## 2023-09-09 DIAGNOSIS — E785 Hyperlipidemia, unspecified: Secondary | ICD-10-CM | POA: Diagnosis not present

## 2023-09-09 DIAGNOSIS — B957 Other staphylococcus as the cause of diseases classified elsewhere: Secondary | ICD-10-CM | POA: Diagnosis not present

## 2023-09-09 DIAGNOSIS — N200 Calculus of kidney: Secondary | ICD-10-CM | POA: Diagnosis not present

## 2023-09-09 DIAGNOSIS — Z87891 Personal history of nicotine dependence: Secondary | ICD-10-CM

## 2023-09-09 DIAGNOSIS — Z4932 Encounter for adequacy testing for peritoneal dialysis: Secondary | ICD-10-CM | POA: Diagnosis not present

## 2023-09-09 DIAGNOSIS — D631 Anemia in chronic kidney disease: Secondary | ICD-10-CM | POA: Diagnosis not present

## 2023-09-09 DIAGNOSIS — G4733 Obstructive sleep apnea (adult) (pediatric): Secondary | ICD-10-CM | POA: Diagnosis not present

## 2023-09-09 DIAGNOSIS — N281 Cyst of kidney, acquired: Secondary | ICD-10-CM | POA: Diagnosis not present

## 2023-09-09 DIAGNOSIS — E878 Other disorders of electrolyte and fluid balance, not elsewhere classified: Secondary | ICD-10-CM | POA: Diagnosis not present

## 2023-09-09 DIAGNOSIS — N2581 Secondary hyperparathyroidism of renal origin: Secondary | ICD-10-CM | POA: Diagnosis not present

## 2023-09-09 DIAGNOSIS — I10 Essential (primary) hypertension: Secondary | ICD-10-CM | POA: Diagnosis not present

## 2023-09-09 DIAGNOSIS — Z992 Dependence on renal dialysis: Secondary | ICD-10-CM | POA: Diagnosis not present

## 2023-09-09 DIAGNOSIS — E876 Hypokalemia: Secondary | ICD-10-CM | POA: Diagnosis not present

## 2023-09-09 DIAGNOSIS — R1033 Periumbilical pain: Secondary | ICD-10-CM

## 2023-09-09 DIAGNOSIS — Z6841 Body Mass Index (BMI) 40.0 and over, adult: Secondary | ICD-10-CM

## 2023-09-09 DIAGNOSIS — R197 Diarrhea, unspecified: Secondary | ICD-10-CM | POA: Diagnosis not present

## 2023-09-09 DIAGNOSIS — I120 Hypertensive chronic kidney disease with stage 5 chronic kidney disease or end stage renal disease: Secondary | ICD-10-CM | POA: Diagnosis not present

## 2023-09-09 DIAGNOSIS — Z79899 Other long term (current) drug therapy: Secondary | ICD-10-CM | POA: Diagnosis not present

## 2023-09-09 DIAGNOSIS — K5900 Constipation, unspecified: Secondary | ICD-10-CM | POA: Diagnosis present

## 2023-09-09 DIAGNOSIS — T8029XA Infection following other infusion, transfusion and therapeutic injection, initial encounter: Principal | ICD-10-CM | POA: Diagnosis present

## 2023-09-09 DIAGNOSIS — I959 Hypotension, unspecified: Secondary | ICD-10-CM | POA: Diagnosis not present

## 2023-09-09 DIAGNOSIS — N186 End stage renal disease: Secondary | ICD-10-CM | POA: Diagnosis not present

## 2023-09-09 DIAGNOSIS — Z888 Allergy status to other drugs, medicaments and biological substances status: Secondary | ICD-10-CM

## 2023-09-09 DIAGNOSIS — Z8679 Personal history of other diseases of the circulatory system: Secondary | ICD-10-CM

## 2023-09-09 DIAGNOSIS — K659 Peritonitis, unspecified: Secondary | ICD-10-CM | POA: Diagnosis present

## 2023-09-09 DIAGNOSIS — I420 Dilated cardiomyopathy: Secondary | ICD-10-CM | POA: Diagnosis not present

## 2023-09-09 DIAGNOSIS — K838 Other specified diseases of biliary tract: Secondary | ICD-10-CM | POA: Diagnosis not present

## 2023-09-09 LAB — COMPREHENSIVE METABOLIC PANEL WITH GFR
ALT: 16 U/L (ref 0–44)
AST: 15 U/L (ref 15–41)
Albumin: 3.6 g/dL (ref 3.5–5.0)
Alkaline Phosphatase: 75 U/L (ref 38–126)
Anion gap: 17 — ABNORMAL HIGH (ref 5–15)
BUN: 39 mg/dL — ABNORMAL HIGH (ref 6–20)
CO2: 27 mmol/L (ref 22–32)
Calcium: 8.8 mg/dL — ABNORMAL LOW (ref 8.9–10.3)
Chloride: 94 mmol/L — ABNORMAL LOW (ref 98–111)
Creatinine, Ser: 10.4 mg/dL — ABNORMAL HIGH (ref 0.61–1.24)
GFR, Estimated: 5 mL/min — ABNORMAL LOW (ref >60)
Glucose, Bld: 105 mg/dL — ABNORMAL HIGH (ref 70–99)
Potassium: 2.1 mmol/L — CL (ref 3.5–5.1)
Sodium: 138 mmol/L (ref 135–145)
Total Bilirubin: 0.4 mg/dL (ref 0.0–1.2)
Total Protein: 5.9 g/dL — ABNORMAL LOW (ref 6.5–8.1)

## 2023-09-09 LAB — CBC
HCT: 28.4 % — ABNORMAL LOW (ref 39.0–52.0)
Hemoglobin: 9.7 g/dL — ABNORMAL LOW (ref 13.0–17.0)
MCH: 30.7 pg (ref 26.0–34.0)
MCHC: 34.2 g/dL (ref 30.0–36.0)
MCV: 89.9 fL (ref 80.0–100.0)
Platelets: 198 10*3/uL (ref 150–400)
RBC: 3.16 MIL/uL — ABNORMAL LOW (ref 4.22–5.81)
RDW: 16.1 % — ABNORMAL HIGH (ref 11.5–15.5)
WBC: 14.7 10*3/uL — ABNORMAL HIGH (ref 4.0–10.5)
nRBC: 0 % (ref 0.0–0.2)

## 2023-09-09 LAB — MAGNESIUM: Magnesium: 1.7 mg/dL (ref 1.7–2.4)

## 2023-09-09 LAB — POTASSIUM: Potassium: 2.4 mmol/L — CL (ref 3.5–5.1)

## 2023-09-09 LAB — LIPASE, BLOOD: Lipase: 51 U/L (ref 11–51)

## 2023-09-09 LAB — LACTIC ACID, PLASMA: Lactic Acid, Venous: 1.1 mmol/L (ref 0.5–1.9)

## 2023-09-09 MED ORDER — POTASSIUM CHLORIDE 20 MEQ PO PACK
40.0000 meq | PACK | Freq: Once | ORAL | Status: AC
  Administered 2023-09-10: 40 meq via ORAL
  Filled 2023-09-09: qty 2

## 2023-09-09 MED ORDER — POTASSIUM CHLORIDE 10 MEQ/100ML IV SOLN
10.0000 meq | INTRAVENOUS | Status: AC
  Administered 2023-09-09 (×2): 10 meq via INTRAVENOUS
  Filled 2023-09-09: qty 100

## 2023-09-09 MED ORDER — ONDANSETRON HCL 4 MG/2ML IJ SOLN
4.0000 mg | Freq: Once | INTRAMUSCULAR | Status: AC
  Administered 2023-09-09: 4 mg via INTRAVENOUS
  Filled 2023-09-09: qty 2

## 2023-09-09 MED ORDER — HYDROMORPHONE HCL 1 MG/ML IJ SOLN
0.5000 mg | Freq: Once | INTRAMUSCULAR | Status: AC
  Administered 2023-09-09: 0.5 mg via INTRAVENOUS
  Filled 2023-09-09: qty 1

## 2023-09-09 MED ORDER — IOHEXOL 300 MG/ML  SOLN
100.0000 mL | Freq: Once | INTRAMUSCULAR | Status: AC | PRN
  Administered 2023-09-09: 75 mL via INTRAVENOUS

## 2023-09-09 NOTE — ED Notes (Signed)
 Patient transported to CT

## 2023-09-09 NOTE — ED Notes (Signed)
 [facility] at bedside to transport pt to [facility]

## 2023-09-09 NOTE — ED Notes (Signed)
 Called [facility] for transport spoke with Kianna at 10:50p

## 2023-09-09 NOTE — ED Triage Notes (Signed)
 Peritoneal dialysis patient. Last treatment last night but got less fluid off than normal. Abdominal pain starting 1-2 weeks ago. Pain worse today. Increased Monjaro dose 2 weeks ago. Tried miralax  and lactulose  for constipation. +vomiting, -diarrhea. Denies SOB, chest pain.

## 2023-09-09 NOTE — Progress Notes (Signed)
 Plan of Care Note for accepted transfer   Patient: Adrian Frye MRN: [id number]   DOA: 09/09/2023  Facility requesting transfer: [facility] Requesting Provider: Rolinda Climes, DO Reason for transfer: Hypokalemia, abdominal pain. Facility course:  Adrian Frye is a 53 y.o. male, with) end-stage renal disease on peritoneal dialysis, initiated dialysis in 2019, history of hypertension, heart failure, diabetes, who presents to the emergency department for evaluation of abdominal pain.  Abdominal pain has been generalized.  It started about 2 weeks ago, mild at first.  Patient had decreased bowel movements and he started medication for constipation.  Symptoms also started after his last dose of Mounjaro  which was increased just prior.  Abdominal pain gradually worsened over the course of the week.  He did not have much luck with MiraLAX .  As pain progressed, patient did see primary care.  They added lactulose  to his regimen.  Pain worsened over the past 2 days.  No fevers.  No chest pain or shortness of breath.  He reports being able to drawl off less PD fluid in the mornings during this time.  Symptoms became unbearable, prompting emergency department visit today.  Upon presentation to the emergency room, BP was 151/78 with respiratory rate of 22 and otherwise normal vital signs.  Labs revealed hypokalemia of 2.1 and hypochloremia of 94, BUN of 39 and creatinine of 10.4 with a calcium of 8.8 and anion gap of 17, total protein of 5.9 with albumin  of 3.6.  CBC showed leukocytosis of 14.7 with hemoglobin of 9.7 and hematocrit 28.4 close to previous levels.  EKG showed sinus rhythm rate of 83 with multiple PVCs and nonspecific IVCD.  Abdominal and pelvic CT scan revealed the following: 1. No acute findings. 2. Peritoneal dialysis catheter with mild abdominopelvic ascites. 3. Layering gallbladder sludge without associated inflammatory changes. 4. Bilateral non-obstructive renal calculi  measuring up to 3 mm. No hydronephrosis  The patient was given 0.5 mg of IV Dilaudid, 4 mg of IV Zofran  and 10 mill equivalent IV potassium chloride .  Contact was made with nephrology who is aware of that the patient and did not recommend IV antibiotics for the possibility of peritonitis related to his PD.  He will be admitted to a medical telemetry observation bed for further evaluation and management.  Plan of care: The patient is accepted for admission to Telemetry unit, at [facility]  The patient will be under the care and responsibility of the EDP until arrival to [facility].  Author: Virgene Griffin, MD 09/09/2023  Check [email or web address] for on-call coverage.  Nursing staff, Please call [facility] Admits & Consults System-Wide number on Amion as soon as patient's arrival, so appropriate admitting provider can evaluate the pt.

## 2023-09-09 NOTE — ED Provider Notes (Signed)
 [place] EMERGENCY DEPARTMENT AT [facility] Provider Note   CSN: [id number] Arrival date & time: 09/09/23  1759     History  Chief Complaint  Patient presents with   Abdominal Pain    Adrian Frye is a 53 y.o. male.  Patient with history of end-stage renal disease on peritoneal dialysis, initiated dialysis in 2019, history of hypertension, heart failure, diabetes --presents to the emergency department for evaluation of abdominal pain.  Abdominal pain has been generalized.  It started about 2 weeks ago, mild at first.  Patient had decreased bowel movements and he started medication for constipation.  Symptoms also started after his last dose of Mounjaro  which was increased just prior.  Abdominal pain gradually worsened over the course of the week.  He did not have much luck with MiraLAX .  As pain progressed, patient did see primary care.  They added lactulose  to his regimen.  Pain worsened over the past 2 days.  No fevers.  No chest pain or shortness of breath.  He reports being able to drawl off less PD fluid in the mornings during this time.  Symptoms became unbearable, prompting emergency department visit today.       Home Medications Prior to Admission medications   Medication Sig Start Date End Date Taking? Authorizing Provider  albuterol  (VENTOLIN  HFA) 108 (90 Base) MCG/ACT inhaler TAKE 2 PUFFS BY MOUTH EVERY 6 HOURS AS NEEDED FOR WHEEZE OR SHORTNESS OF BREATH 09/14/20   Henson, Vickie L, NP-C  amLODipine  (NORVASC ) 10 MG tablet Take 1/2 tablet by mouth once daily, start at 1/2 tablet daily & if most BPs remain > 150 on top # increase to 1 full tablet daily 05/24/23     amLODipine  (NORVASC ) 2.5 MG tablet TAKE 1 TABLET BY MOUTH EVERY DAY 12/08/22   Jacqueline Matsu, MD  AURYXIA  1 GM 210 MG(Fe) tablet Take 630 mg by mouth 3 (three) times daily with meals. 07/05/21   [provider]  B Complex-C-Folic Acid  (DIALYVITE 800 PO) Take 1 tablet by mouth daily with  lunch.    [provider]  calcitRIOL  (ROCALTROL ) 0.5 MCG capsule Take 0.5 mcg by mouth daily with lunch. 04/02/20   [provider]  carvedilol  (COREG ) 25 MG tablet Take 1 tablet (25 mg total) by mouth 2 (two) times daily. 07/05/23     carvedilol  (COREG ) 3.125 MG tablet Take 1 tablet (3.125 mg total) by mouth 2 (two) times daily with a meal in the morning and in the evening 05/09/23     carvedilol  (COREG ) 6.25 MG tablet Take 1 tablet (6.25 mg total) by mouth 2 (two) times daily with food 06/12/23     Continuous Glucose Sensor (DEXCOM G7 SENSOR) MISC Apply sensor to skin every 10 days 05/19/23     gentamicin  cream (GARAMYCIN ) 0.1 % Apply 1 Application topically daily as needed (catheter dressing change).    [provider]  hydrALAZINE  (APRESOLINE ) 50 MG tablet Take 1 tablet (50 mg total) by mouth 2 (two) times daily as needed for blood pressure 180/90 or higher 06/12/23     hydrALAZINE  (APRESOLINE ) 50 MG tablet Take 1 tablet (50 mg total) by mouth every 8 (eight) hours with food 07/03/23     lactulose  (CHRONULAC ) 10 GM/15ML solution Take 60 mLs (40 g total) by mouth every 1 hours for max of 4 doses. If  no relief call RN/MD. 09/08/23     Methoxy PEG-Epoetin  Beta (MIRCERA IJ) Inject into the skin. As needed 02/07/20  [provider]  ondansetron  (ZOFRAN -ODT) 4 MG disintegrating tablet Take 1 tablet (4 mg total) by mouth every 6 (six) hours as needed. 08/29/23     oseltamivir  (TAMIFLU ) 75 MG capsule Take 1 capsule (75 mg total) by mouth daily. 07/03/23     spironolactone  (ALDACTONE ) 25 MG tablet Take 1 tablet (25 mg total) by mouth daily. 07/31/23     tirzepatide  (MOUNJARO ) 2.5 MG/0.5ML Pen Inject 2.5 mg into the skin once a week. 05/01/23     tirzepatide  (MOUNJARO ) 5 MG/0.5ML Pen Inject 5 mg into the skin once a week. 09/05/23     tirzepatide  (MOUNJARO ) 7.5 MG/0.5ML Pen Inject 7.5 mg into the skin once a week. 08/02/23     traZODone  (DESYREL ) 50 MG tablet Take 200 mg by mouth  at bedtime. 08/09/22   [provider]  traZODone  (DESYREL ) 50 MG tablet Take 4 tablets (200 mg total) by mouth at bedtime. 08/28/23         Allergies    Ace inhibitors, Lisinopril, and Losartan    Review of Systems   Review of Systems  Physical Exam Updated Vital Signs BP (!) 151/78 (BP Location: Right Arm)   Pulse 94   Temp 98.3 F (36.8 C) (Oral)   Resp (!) 22   Ht 5\' 7"  (1.702 m)   Wt 117 kg   SpO2 97%   BMI 40.41 kg/m  Physical Exam Vitals and nursing note reviewed.  Constitutional:      General: He is not in acute distress.    Appearance: He is well-developed.  HENT:     Head: Normocephalic and atraumatic.  Eyes:     General:        Right eye: No discharge.        Left eye: No discharge.     Conjunctiva/sclera: Conjunctivae normal.  Cardiovascular:     Rate and Rhythm: Normal rate and regular rhythm.     Heart sounds: Normal heart sounds.  Pulmonary:     Effort: Pulmonary effort is normal.     Breath sounds: Normal breath sounds.  Abdominal:     General: There is distension.     Palpations: Abdomen is soft.     Tenderness: There is abdominal tenderness. There is guarding. There is no rebound. Negative signs include Murphy's sign and McBurney's sign.     Comments: Patient looks uncomfortable with movement, winces to palpation in the mid abdomen.  Musculoskeletal:     Cervical back: Normal range of motion and neck supple.  Skin:    General: Skin is warm and dry.  Neurological:     Mental Status: He is alert.     ED Results / Procedures / Treatments   Labs (all labs ordered are listed, but only abnormal results are displayed) Labs Reviewed  COMPREHENSIVE METABOLIC PANEL WITH GFR - Abnormal; Notable for the following components:      Result Value   Potassium 2.1 (*)    Chloride 94 (*)    Glucose, Bld 105 (*)    BUN 39 (*)    Creatinine, Ser 10.40 (*)    Calcium 8.8 (*)    Total Protein 5.9 (*)    GFR, Estimated 5 (*)    Anion gap 17 (*)     All other components within normal limits  CBC - Abnormal; Notable for the following components:   WBC 14.7 (*)    RBC 3.16 (*)    Hemoglobin 9.7 (*)    HCT 28.4 (*)  RDW 16.1 (*)    All other components within normal limits  LIPASE, BLOOD  LACTIC ACID, PLASMA  MAGNESIUM    EKG None  Radiology CT ABDOMEN PELVIS W CONTRAST Result Date: 09/09/2023 EXAM: CT ABDOMEN AND PELVIS WITH CONTRAST 09/09/2023 08:19:53 PM TECHNIQUE: CT of the abdomen and pelvis was performed with intravenous contrast. Multiplanar reformatted images are provided for review. Automated exposure control, iterative reconstruction, and/or weight based adjustment of the mA/kV was utilized to reduce the radiation dose to as low as reasonably achievable. CONTRAST: 75mL iohexol (OMNIPAQUE) 300 MG/ML solution 100 mL IOHEXOL 300 MG/ML SOLN COMPARISON: None available. CLINICAL HISTORY: Abdominal pain, acute, nonlocalized; PD patient, worsening abdominal pain for 2 weeks, vomiting. Peritoneal dialysis patient. Last treatment last night but got less fluid off than normal. Abdominal pain starting 1-2 weeks ago. Pain worse today. Increased Monjaro dose 2 weeks ago. Tried Miralax  and lactulose  for constipation. Positive vomiting, negative diarrhea. Denies shortness of breath, chest pain. Dr. Lynder Sanger from body reading room okayed study via verbal phone call. FINDINGS: LOWER CHEST: Mild bibasilar scarring. HEPATOBILIARY: Layering gallbladder sludge without associated inflammatory changes. SPLEEN: No acute abnormality. PANCREAS: No acute abnormality. ADRENAL GLANDS: No acute abnormality. KIDNEYS, URETERS: Bilateral renal atrophy with 2.4 cm simple medial right upper pole renal cyst (image 39), benign (Bosniak 1). Bilateral non-obstructive renal calculi measuring up to 3 mm. No evidence of hydronephrosis. No evidence of perinephric or periureteral stranding. GI AND BOWEL: Normal appendix (image 57). Stomach demonstrates no acute abnormality.  There is no evidence of bowel obstruction. No evidence of appendicitis. PELVIS: Urinary bladder is decompressed. Peritoneal dialysis catheter in the left lower pelvis with associated mild abdominopelvic ascites. PERITONEUM AND RETROPERITONEUM: No evidence of free air. LYMPH NODES: No evidence of lymphadenopathy. VASCULATURE: Atherosclerotic calcifications of the abdominal aorta and branch vessels. BONES AND SOFT TISSUES: No acute osseous abnormality. No focal soft tissue abnormality. LIMITATIONS/ARTIFACTS: Motion artifact. Incidental adrenal and/or renal findings do not require follow up imaging. IMPRESSION: 1. No acute findings. 2. Peritoneal dialysis catheter with mild abdominopelvic ascites. 3. Layering gallbladder sludge without associated inflammatory changes. 4. Bilateral non-obstructive renal calculi measuring up to 3 mm. No hydronephrosis. Electronically signed by: Zadie Herter MD 09/09/2023 08:32 PM EDT RP Workstation: [id number]    Procedures Procedures    Medications Ordered in ED Medications  potassium chloride  10 mEq in 100 mL IVPB (10 mEq Intravenous New Bag/Given 09/09/23 1949)  HYDROmorphone (DILAUDID) injection 0.5 mg (0.5 mg Intravenous Given 09/09/23 1840)  ondansetron  (ZOFRAN ) injection 4 mg (4 mg Intravenous Given 09/09/23 1839)  iohexol (OMNIPAQUE) 300 MG/ML solution 100 mL (75 mLs Intravenous Contrast Given 09/09/23 2011)    ED Course/ Medical Decision Making/ A&P Clinical Course as of 09/09/23 2159  Sat Sep 09, 2023  2151 Spoke with hospitalist who agrees to see admit patient. [TY]    Clinical Course User Index [TY] Rolinda Climes, DO    Patient seen and examined. History obtained directly from patient and family member at bedside.  I reviewed recent PCP notes.  Labs/EKG: Ordered CBC, CMP, lipase, lactate.  Imaging: Ordered CT abdomen pelvis  Medications/Fluids: Ordered: Dilaudid/Zofran .   Most recent vital signs reviewed and are as follows: BP (!) 151/78 (BP  Location: Right Arm)   Pulse 94   Temp 98.3 F (36.8 C) (Oral)   Resp (!) 22   Ht 5\' 7"  (1.702 m)   Wt 117 kg   SpO2 97%   BMI 40.41 kg/m   Initial impression: Abdominal pain in  patient on PD, he looks clinically well, no fevers but uncomfortable in pain.  ///  Notified of critically low potassium at 2.1.  Added magnesium, EKG.  Will give 2 runs of IV potassium.  ///  8:44 PM Reassessment performed. Patient appears improved.  On some supplemental oxygen after pain medication.  Reports pain "coming and going".  Labs personally reviewed and interpreted including: CBC with white blood cell count 14.7, hemoglobin at baseline at 9.7; CMP potassium 2.1, chloride 94, creatinine at 10.4 with BUN of 39, anion gap 17; lipase normal  Imaging personally visualized and interpreted including:  Reviewed pertinent lab work and imaging with patient at bedside. Questions answered.   Most current vital signs reviewed and are as follows: BP (!) 151/78 (BP Location: Right Arm)   Pulse 94   Temp 98.3 F (36.8 C) (Oral)   Resp (!) 22   Ht 5\' 7"  (1.702 m)   Wt 117 kg   SpO2 97%   BMI 40.41 kg/m   Plan: Admit to hospital for potassium repletion and further workup.  I did discuss the case with Dr. Cindra Cree of nephrology.  States that low potassium and constipation is a very common issue with peritoneal dialysis patients.  Suspect that patient may have pain from significant constipation, and would advise treatment with magnesium citrate.  Peritonitis is also a possibility.  He agrees with admission, they can consult tomorrow for fluid drawl and testing to rule out peritonitis.  No indication to cover with antibiotics at this time.  9:59 PM Dr. Linder Revere has spoken with Dr. Achilles Holes regarding admission.                                    Medical Decision Making Amount and/or Complexity of Data Reviewed Labs: ordered. Radiology: ordered.  Risk Prescription drug management. Decision regarding  hospitalization.   For this patient's complaint of abdominal pain, the following conditions were considered on the differential diagnosis: gastritis/PUD, enteritis/duodenitis, appendicitis, cholelithiasis/cholecystitis, cholangitis, pancreatitis, ruptured viscus, colitis, diverticulitis, small/large bowel obstruction, proctitis, cystitis, pyelonephritis, ureteral colic, aortic dissection, aortic aneurysm. Atypical chest etiologies were also considered including ACS, PE, and pneumonia.  Patient found to have hypokalemia with normal magnesium.  PVCs on EKG otherwise no concerning findings.  Giving some repletion here.  CT is otherwise reassuring.  Peritonitis is on the differential and will likely require testing tomorrow.  No indication for antibiotics given well appearance and no fever.        Final Clinical Impression(s) / ED Diagnoses Final diagnoses:  Hypokalemia  Periumbilical abdominal pain    Rx / DC Orders ED Discharge Orders     None         Lyna Sandhoff, PA-C 09/09/23 2159    Rolinda Climes, DO 09/09/23 2226

## 2023-09-10 ENCOUNTER — Encounter (HOSPITAL_COMMUNITY): Payer: Self-pay | Admitting: Family Medicine

## 2023-09-10 DIAGNOSIS — R1033 Periumbilical pain: Secondary | ICD-10-CM | POA: Diagnosis not present

## 2023-09-10 DIAGNOSIS — Z87891 Personal history of nicotine dependence: Secondary | ICD-10-CM | POA: Diagnosis not present

## 2023-09-10 DIAGNOSIS — R109 Unspecified abdominal pain: Secondary | ICD-10-CM | POA: Diagnosis present

## 2023-09-10 DIAGNOSIS — Z888 Allergy status to other drugs, medicaments and biological substances status: Secondary | ICD-10-CM | POA: Diagnosis not present

## 2023-09-10 DIAGNOSIS — E66813 Obesity, class 3: Secondary | ICD-10-CM | POA: Diagnosis present

## 2023-09-10 DIAGNOSIS — I120 Hypertensive chronic kidney disease with stage 5 chronic kidney disease or end stage renal disease: Secondary | ICD-10-CM | POA: Diagnosis present

## 2023-09-10 DIAGNOSIS — E1122 Type 2 diabetes mellitus with diabetic chronic kidney disease: Secondary | ICD-10-CM | POA: Diagnosis present

## 2023-09-10 DIAGNOSIS — E876 Hypokalemia: Secondary | ICD-10-CM | POA: Diagnosis present

## 2023-09-10 DIAGNOSIS — I420 Dilated cardiomyopathy: Secondary | ICD-10-CM | POA: Diagnosis present

## 2023-09-10 DIAGNOSIS — Z992 Dependence on renal dialysis: Secondary | ICD-10-CM | POA: Diagnosis not present

## 2023-09-10 DIAGNOSIS — D631 Anemia in chronic kidney disease: Secondary | ICD-10-CM | POA: Diagnosis not present

## 2023-09-10 DIAGNOSIS — B957 Other staphylococcus as the cause of diseases classified elsewhere: Secondary | ICD-10-CM | POA: Diagnosis present

## 2023-09-10 DIAGNOSIS — Z79899 Other long term (current) drug therapy: Secondary | ICD-10-CM | POA: Diagnosis not present

## 2023-09-10 DIAGNOSIS — Z8249 Family history of ischemic heart disease and other diseases of the circulatory system: Secondary | ICD-10-CM | POA: Diagnosis not present

## 2023-09-10 DIAGNOSIS — N2581 Secondary hyperparathyroidism of renal origin: Secondary | ICD-10-CM | POA: Diagnosis not present

## 2023-09-10 DIAGNOSIS — E878 Other disorders of electrolyte and fluid balance, not elsewhere classified: Secondary | ICD-10-CM | POA: Diagnosis present

## 2023-09-10 DIAGNOSIS — Z7985 Long-term (current) use of injectable non-insulin antidiabetic drugs: Secondary | ICD-10-CM | POA: Diagnosis not present

## 2023-09-10 DIAGNOSIS — K659 Peritonitis, unspecified: Secondary | ICD-10-CM | POA: Diagnosis present

## 2023-09-10 DIAGNOSIS — K5900 Constipation, unspecified: Secondary | ICD-10-CM | POA: Diagnosis present

## 2023-09-10 DIAGNOSIS — G4733 Obstructive sleep apnea (adult) (pediatric): Secondary | ICD-10-CM | POA: Diagnosis present

## 2023-09-10 DIAGNOSIS — Z6841 Body Mass Index (BMI) 40.0 and over, adult: Secondary | ICD-10-CM | POA: Diagnosis not present

## 2023-09-10 DIAGNOSIS — J45909 Unspecified asthma, uncomplicated: Secondary | ICD-10-CM | POA: Diagnosis present

## 2023-09-10 DIAGNOSIS — Y841 Kidney dialysis as the cause of abnormal reaction of the patient, or of later complication, without mention of misadventure at the time of the procedure: Secondary | ICD-10-CM | POA: Diagnosis present

## 2023-09-10 DIAGNOSIS — T8029XA Infection following other infusion, transfusion and therapeutic injection, initial encounter: Secondary | ICD-10-CM | POA: Diagnosis present

## 2023-09-10 DIAGNOSIS — E785 Hyperlipidemia, unspecified: Secondary | ICD-10-CM | POA: Diagnosis present

## 2023-09-10 DIAGNOSIS — Z4932 Encounter for adequacy testing for peritoneal dialysis: Secondary | ICD-10-CM | POA: Diagnosis not present

## 2023-09-10 DIAGNOSIS — N186 End stage renal disease: Secondary | ICD-10-CM | POA: Diagnosis not present

## 2023-09-10 LAB — IRON AND TIBC
Iron: 24 ug/dL — ABNORMAL LOW (ref 45–182)
Saturation Ratios: 21 % (ref 17.9–39.5)
TIBC: 116 ug/dL — ABNORMAL LOW (ref 250–450)
UIBC: 92 ug/dL

## 2023-09-10 LAB — BODY FLUID CELL COUNT WITH DIFFERENTIAL
Eos, Fluid: 1 %
Lymphs, Fluid: 2 %
Monocyte-Macrophage-Serous Fluid: 10 % — ABNORMAL LOW (ref 50–90)
Neutrophil Count, Fluid: 87 % — ABNORMAL HIGH (ref 0–25)
Total Nucleated Cell Count, Fluid: 53530 uL — ABNORMAL HIGH (ref 0–1000)

## 2023-09-10 LAB — PHOSPHORUS: Phosphorus: 4.9 mg/dL — ABNORMAL HIGH (ref 2.5–4.6)

## 2023-09-10 LAB — CBC
HCT: 24.9 % — ABNORMAL LOW (ref 39.0–52.0)
Hemoglobin: 8.3 g/dL — ABNORMAL LOW (ref 13.0–17.0)
MCH: 30.1 pg (ref 26.0–34.0)
MCHC: 33.3 g/dL (ref 30.0–36.0)
MCV: 90.2 fL (ref 80.0–100.0)
Platelets: 180 10*3/uL (ref 150–400)
RBC: 2.76 MIL/uL — ABNORMAL LOW (ref 4.22–5.81)
RDW: 16.4 % — ABNORMAL HIGH (ref 11.5–15.5)
WBC: 12.3 10*3/uL — ABNORMAL HIGH (ref 4.0–10.5)
nRBC: 0 % (ref 0.0–0.2)

## 2023-09-10 LAB — BASIC METABOLIC PANEL WITH GFR
Anion gap: 15 (ref 5–15)
BUN: 42 mg/dL — ABNORMAL HIGH (ref 6–20)
CO2: 26 mmol/L (ref 22–32)
Calcium: 7.8 mg/dL — ABNORMAL LOW (ref 8.9–10.3)
Chloride: 94 mmol/L — ABNORMAL LOW (ref 98–111)
Creatinine, Ser: 10.79 mg/dL — ABNORMAL HIGH (ref 0.61–1.24)
GFR, Estimated: 5 mL/min — ABNORMAL LOW (ref >60)
Glucose, Bld: 110 mg/dL — ABNORMAL HIGH (ref 70–99)
Potassium: 2.5 mmol/L — CL (ref 3.5–5.1)
Sodium: 135 mmol/L (ref 135–145)

## 2023-09-10 LAB — FERRITIN: Ferritin: 1323 ng/mL — ABNORMAL HIGH (ref 24–336)

## 2023-09-10 LAB — POTASSIUM: Potassium: 3.1 mmol/L — ABNORMAL LOW (ref 3.5–5.1)

## 2023-09-10 LAB — HIV ANTIBODY (ROUTINE TESTING W REFLEX): HIV Screen 4th Generation wRfx: NONREACTIVE

## 2023-09-10 MED ORDER — FERRIC CITRATE 1 GM 210 MG(FE) PO TABS
630.0000 mg | ORAL_TABLET | Freq: Three times a day (TID) | ORAL | Status: DC
  Administered 2023-09-10 – 2023-09-13 (×7): 630 mg via ORAL
  Filled 2023-09-10 (×9): qty 3

## 2023-09-10 MED ORDER — SIMETHICONE 80 MG PO CHEW
80.0000 mg | CHEWABLE_TABLET | Freq: Four times a day (QID) | ORAL | Status: DC | PRN
  Administered 2023-09-10: 80 mg via ORAL
  Filled 2023-09-10: qty 1

## 2023-09-10 MED ORDER — VANCOMYCIN HCL 10 G IV SOLR
2500.0000 mg | Freq: Once | INTRAVENOUS | Status: AC
  Administered 2023-09-10: 2500 mg via INTRAVENOUS
  Filled 2023-09-10: qty 30

## 2023-09-10 MED ORDER — POTASSIUM CHLORIDE 20 MEQ PO PACK
40.0000 meq | PACK | ORAL | Status: AC
  Administered 2023-09-10 (×2): 40 meq via ORAL
  Filled 2023-09-10 (×2): qty 2

## 2023-09-10 MED ORDER — POTASSIUM CHLORIDE 10 MEQ/100ML IV SOLN
10.0000 meq | INTRAVENOUS | Status: AC
  Administered 2023-09-10 (×4): 10 meq via INTRAVENOUS
  Filled 2023-09-10 (×4): qty 100

## 2023-09-10 MED ORDER — DELFLEX-LC/1.5% DEXTROSE 344 MOSM/L IP SOLN: INTRAPERITONEAL | Status: DC

## 2023-09-10 MED ORDER — POTASSIUM CHLORIDE 20 MEQ PO PACK
40.0000 meq | PACK | ORAL | Status: AC
  Administered 2023-09-10 (×3): 40 meq via ORAL
  Filled 2023-09-10 (×3): qty 2

## 2023-09-10 MED ORDER — SPIRONOLACTONE 25 MG PO TABS
25.0000 mg | ORAL_TABLET | Freq: Every day | ORAL | Status: DC
  Administered 2023-09-10 – 2023-09-12 (×3): 25 mg via ORAL
  Filled 2023-09-10 (×3): qty 1

## 2023-09-10 MED ORDER — PROCHLORPERAZINE EDISYLATE 10 MG/2ML IJ SOLN
5.0000 mg | Freq: Four times a day (QID) | INTRAMUSCULAR | Status: DC | PRN
  Administered 2023-09-12: 5 mg via INTRAVENOUS
  Filled 2023-09-10: qty 2

## 2023-09-10 MED ORDER — SODIUM CHLORIDE 0.9% FLUSH
3.0000 mL | Freq: Two times a day (BID) | INTRAVENOUS | Status: DC
  Administered 2023-09-10 – 2023-09-13 (×8): 3 mL via INTRAVENOUS

## 2023-09-10 MED ORDER — VANCOMYCIN HCL 10 G IV SOLR
2500.0000 mg | Freq: Once | INTRAVENOUS | Status: DC
  Filled 2023-09-10 (×2): qty 25

## 2023-09-10 MED ORDER — OXYCODONE HCL 5 MG PO TABS
5.0000 mg | ORAL_TABLET | Freq: Four times a day (QID) | ORAL | Status: DC | PRN
  Administered 2023-09-10 – 2023-09-12 (×4): 5 mg via ORAL
  Filled 2023-09-10 (×4): qty 1

## 2023-09-10 MED ORDER — VANCOMYCIN VARIABLE DOSE PER UNSTABLE RENAL FUNCTION (PHARMACIST DOSING): Status: DC

## 2023-09-10 MED ORDER — HEPARIN SODIUM (PORCINE) 5000 UNIT/ML IJ SOLN
5000.0000 [IU] | Freq: Three times a day (TID) | INTRAMUSCULAR | Status: DC
  Administered 2023-09-10 – 2023-09-14 (×12): 5000 [IU] via SUBCUTANEOUS
  Filled 2023-09-10 (×12): qty 1

## 2023-09-10 MED ORDER — DELFLEX-LC/2.5% DEXTROSE 394 MOSM/L IP SOLN: INTRAPERITONEAL | Status: DC

## 2023-09-10 MED ORDER — MAGNESIUM SULFATE 2 GM/50ML IV SOLN
2.0000 g | Freq: Once | INTRAVENOUS | Status: AC
  Administered 2023-09-10: 2 g via INTRAVENOUS
  Filled 2023-09-10: qty 50

## 2023-09-10 MED ORDER — VANCOMYCIN HCL 10 G IV SOLR: 2500.0000 mg | Freq: Once | INTRAVENOUS | Status: DC

## 2023-09-10 MED ORDER — CALCITRIOL 0.5 MCG PO CAPS
0.5000 ug | ORAL_CAPSULE | Freq: Every day | ORAL | Status: DC
  Administered 2023-09-10 – 2023-09-13 (×4): 0.5 ug via ORAL
  Filled 2023-09-10 (×4): qty 1

## 2023-09-10 MED ORDER — HYDRALAZINE HCL 50 MG PO TABS
50.0000 mg | ORAL_TABLET | Freq: Three times a day (TID) | ORAL | Status: DC
  Administered 2023-09-10 – 2023-09-12 (×7): 50 mg via ORAL
  Filled 2023-09-10 (×11): qty 1

## 2023-09-10 MED ORDER — TRAZODONE HCL 50 MG PO TABS
200.0000 mg | ORAL_TABLET | Freq: Every day | ORAL | Status: DC
  Administered 2023-09-10 – 2023-09-13 (×5): 200 mg via ORAL
  Filled 2023-09-10 (×5): qty 4

## 2023-09-10 MED ORDER — HYDROMORPHONE HCL 1 MG/ML IJ SOLN
0.5000 mg | INTRAMUSCULAR | Status: DC | PRN
  Administered 2023-09-10: 0.5 mg via INTRAVENOUS
  Administered 2023-09-11 – 2023-09-14 (×4): 1 mg via INTRAVENOUS
  Filled 2023-09-10 (×5): qty 1

## 2023-09-10 MED ORDER — CARVEDILOL 12.5 MG PO TABS
12.5000 mg | ORAL_TABLET | Freq: Two times a day (BID) | ORAL | Status: DC
  Administered 2023-09-10 – 2023-09-12 (×6): 12.5 mg via ORAL
  Filled 2023-09-10 (×6): qty 1

## 2023-09-10 MED ORDER — SODIUM CHLORIDE 0.9 % IV SOLN
1.0000 g | INTRAVENOUS | Status: DC
  Administered 2023-09-10: 1 g via INTRAVENOUS
  Filled 2023-09-10 (×2): qty 1

## 2023-09-10 MED ORDER — POLYETHYLENE GLYCOL 3350 17 G PO PACK
34.0000 g | PACK | Freq: Once | ORAL | Status: AC
  Administered 2023-09-10: 34 g via ORAL
  Filled 2023-09-10: qty 2

## 2023-09-10 MED ORDER — ACETAMINOPHEN 650 MG RE SUPP: 650.0000 mg | Freq: Four times a day (QID) | RECTAL | Status: DC | PRN

## 2023-09-10 MED ORDER — DELFLEX-LC/4.25% DEXTROSE 483 MOSM/L IP SOLN: INTRAPERITONEAL | Status: DC

## 2023-09-10 MED ORDER — MAGNESIUM CITRATE PO SOLN
1.0000 | Freq: Once | ORAL | Status: AC
  Administered 2023-09-10: 1 via ORAL
  Filled 2023-09-10: qty 296

## 2023-09-10 MED ORDER — GENTAMICIN SULFATE 0.1 % EX CREA
1.0000 | TOPICAL_CREAM | Freq: Every day | CUTANEOUS | Status: DC
  Administered 2023-09-10 – 2023-09-12 (×3): 1 via TOPICAL
  Filled 2023-09-10 (×2): qty 15

## 2023-09-10 MED ORDER — ACETAMINOPHEN 325 MG PO TABS: 650.0000 mg | ORAL_TABLET | Freq: Four times a day (QID) | ORAL | Status: DC | PRN

## 2023-09-10 MED ORDER — MAGNESIUM CITRATE PO SOLN: 1.0000 | Freq: Once | ORAL | Status: DC | PRN

## 2023-09-10 NOTE — Consult Note (Signed)
 ESRD Consult Note  Requesting provider: Fonnie Iba Service requesting consult: Hospitalist Reason for consult: ESRD, provision of dialysis Indication for acute dialysis?: End Stage Renal Disease  Outpatient dialysis unit: [facility] Outpatient dialysis prescription: 6 cycles, 3L fill, 1.5hrs, EDW 121, last fill 2L, amlodipine  10mg , auryxia  3 tabs, calcitriol  0.5mcg, coreg  12.5 bid, , spiro 25, xphoza  Assessment/Recommendations: Adrian Frye is a/an 53 y.o. male with a past medical history notable for ESRD on HD admitted with abdominal pain.   # ESRD: Hopefully can DC today and resume PD at home tonight once peritonitis has been ruled out. If he is here tonight we will do his regular PD but no last fill.  # Volume/ hypertension: Can continue home BP meds. May want to increase spiro dose to help with hypokalemia  # Anemia of Chronic Kidney Disease: Hemoglobin 8.3. Some dilution possibly. Will obtain iron  studies but hold on administration until infection is ruled out  # Secondary Hyperparathyroidism/Hyperphosphatemia: Cont home calcitriol  and auryxia   # Abdominal Pain: Most likely constipation which is multifactorial including hypokalemia as a cause as well as GLP-1 agonist.  However need to rule out peritonitis - Obtain fluid sample today - Correct hypokalemia as below - Would hold GLP-1 agonist for now - Will aggressively manage constipation with magnesium citrate x 1 and MiraLAX   # Hypokalemia: Continue with IV and oral supplementation  If the patient's fluid samples returned with no signs of peritonitis and his potassium improves he would be okay for discharge from my perspective to to do PD at home tonight.  If he remains inpatient we will do PD here  # Additional recommendations: - Dose all meds for creatinine clearance < 10 ml/min  - Unless absolutely necessary, no MRIs with gadolinium.  - Implement save arm precautions.  Prefer needle sticks in the  dorsum of the hands or wrists.  No blood pressure measurements in arm. - If blood transfusion is requested during hemodialysis sessions, please alert us  prior to the session.  - Use synthetic opioids (Fentanyl /Dilaudid) if needed  Recommendations were discussed with the primary team.   History of Present Illness: Adrian Frye is a/an 53 y.o. male with a past medical history of ESRD who presents with abdominal pain  Patient said he has had persistent abdominal pain for the past couple weeks.  The pain has been somewhat persistent and slow to improve.  He states the pain is generalized and he has also had some nausea as well as vomiting.  He discussed this with his home therapy staff and he noted he has had issues with constipation so they recommended taking lactulose .  He was able to have a bowel movement but has not had one in about 1 to 2 days.  He is continue with peritoneal dialysis but sometimes had issues with inadequate draining and frequent alarms.  No cloudy effluent or bloody effluent.  He denies significant fevers or chills.  No shortness of breath or chest pain.  He has noted significant constipation since starting his GLP-1 agonist  In the emergency department he was noted to have hypokalemia and was given some IV potassium.   Medications:  Current Facility-Administered Medications  Medication Dose Route Frequency Provider Last Rate Last Admin   acetaminophen  (TYLENOL ) tablet 650 mg  650 mg Oral Q6H PRN Opyd, Timothy S, MD       Or   acetaminophen  (TYLENOL ) suppository 650 mg  650 mg Rectal Q6H PRN Opyd, Santana Cue, MD  calcitRIOL  (ROCALTROL ) capsule 0.5 mcg  0.5 mcg Oral Q lunch Opyd, Santana Cue, MD       carvedilol  (COREG ) tablet 12.5 mg  12.5 mg Oral BID WC Opyd, Timothy S, MD       dialysis solution 1.5% low-MG/low-CA dianeal solution   Intraperitoneal Q24H Levorn Reason, MD       dialysis solution 2.5% low-MG/low-CA dianeal solution   Intraperitoneal Q24H  Levorn Reason, MD       dialysis solution 4.25% low-MG/low-CA dianeal solution   Intraperitoneal Q24H Destina Mantei J, MD       gentamicin  cream (GARAMYCIN ) 0.1 % 1 Application  1 Application Topical Daily Dacia Capers J, MD       heparin  injection 5,000 Units  5,000 Units Subcutaneous Q8H Opyd, Timothy S, MD   5,000 Units at 09/10/23 0600   hydrALAZINE  (APRESOLINE ) tablet 50 mg  50 mg Oral Q8H Opyd, Timothy S, MD   50 mg at 09/10/23 0041   HYDROmorphone (DILAUDID) injection 0.5-1 mg  0.5-1 mg Intravenous Q4H PRN Opyd, Timothy S, MD   0.5 mg at 09/10/23 0052   magnesium citrate solution 1 Bottle  1 Bottle Oral Once PRN Levorn Reason, MD       oxyCODONE  (Oxy IR/ROXICODONE ) immediate release tablet 5 mg  5 mg Oral Q6H PRN Opyd, Timothy S, MD       polyethylene glycol (MIRALAX  / GLYCOLAX ) packet 34 g  34 g Oral Once Edrian Melucci J, MD       potassium chloride  (KLOR-CON ) packet 40 mEq  40 mEq Oral Q4H Tayshon Winker J, MD   40 mEq at 09/10/23 1610   potassium chloride  10 mEq in 100 mL IVPB  10 mEq Intravenous Q1 Hr x 4 Levorn Reason, MD 100 mL/hr at 09/10/23 0647 10 mEq at 09/10/23 9604   prochlorperazine (COMPAZINE) injection 5 mg  5 mg Intravenous Q6H PRN Opyd, Timothy S, MD       simethicone (MYLICON) chewable tablet 80 mg  80 mg Oral QID PRN Opyd, Timothy S, MD       sodium chloride  flush (NS) 0.9 % injection 3 mL  3 mL Intravenous Q12H Opyd, Timothy S, MD   3 mL at 09/10/23 0041   spironolactone  (ALDACTONE ) tablet 25 mg  25 mg Oral Daily Opyd, Santana Cue, MD       traZODone  (DESYREL ) tablet 200 mg  200 mg Oral QHS Opyd, Santana Cue, MD   200 mg at 09/10/23 0041     ALLERGIES Ace inhibitors, Lisinopril, and Losartan  MEDICAL HISTORY Past Medical History:  Diagnosis Date   Allergic rhinitis    Allergy    Anemia    Asthma    as a child   Bacteremia    Benign essential hypertension    Carotid artery stenosis, asymptomatic, left    1-39% left by dopplers 10/2022   CHF  (congestive heart failure) (HCC)    Dilated idiopathic cardiomyopathy (HCC)    now resoved with EF 55% by echo 2011   Edema    Encounter for blood transfusion    Endocarditis    ESRD (end stage renal disease) (HCC)    ESRD on peritoneal dialysis (HCC)    Heart disease    Hyperlipidemia    Hyperparathyroidism, secondary (HCC)    Hypertension    Morbid obesity (HCC)    Sleep apnea    c-pap   UGI bleed 2011   ASA     SOCIAL HISTORY  Social History   Socioeconomic History   Marital status: Married    Spouse name: Not on file   Number of children: 2   Years of education: Not on file   Highest education level: Not on file  Occupational History   Occupation: real Psychologist, occupational  Tobacco Use   Smoking status: Former    Current packs/day: 0.00    Types: Cigarettes    Start date: 05/17/2003    Quit date: 05/16/2009    Years since quitting: 14.3   Smokeless tobacco: Never   Tobacco comments:    smoked 1 pack per week   Vaping Use   Vaping status: Never Used  Substance and Sexual Activity   Alcohol use: Not Currently    Alcohol/week: 2.0 standard drinks of alcohol    Types: 2 Cans of beer per week    Comment: occasionally   Drug use: No   Sexual activity: Yes  Other Topics Concern   Not on file  Social History Narrative   Not on file   Social Drivers of Health   Financial Resource Strain: Low Risk  (03/11/2022)   Overall Financial Resource Strain (CARDIA)    Difficulty of Paying Living Expenses: Not hard at all  Food Insecurity: No Food Insecurity (09/09/2023)   Hunger Vital Sign    Worried About Running Out of Food in the Last Year: Never true    Ran Out of Food in the Last Year: Never true  Transportation Needs: No Transportation Needs (09/09/2023)   PRAPARE - Administrator, Civil Service (Medical): No    Lack of Transportation (Non-Medical): No  Physical Activity: Sufficiently Active (03/11/2022)   Exercise Vital Sign    Days of Exercise per Week: 3  days    Minutes of Exercise per Session: 60 min  Stress: No Stress Concern Present (03/11/2022)   [employer] of Occupational Health - Occupational Stress Questionnaire    Feeling of Stress : Not at all  Social Connections: Unknown (09/27/2021)   Received from [facility]   Social Network    Social Network: Not on file  Intimate Partner Violence: Not At Risk (09/09/2023)   Humiliation, Afraid, Rape, and Kick questionnaire    Fear of Current or Ex-Partner: No    Emotionally Abused: No    Physically Abused: No    Sexually Abused: No     FAMILY HISTORY Family History  Problem Relation Age of Onset   Diabetes Mother    Heart failure Mother    Hypertension Mother    Heart disease Father    Hypertension Brother    Hypertension Brother    Colon cancer Neg Hx    Esophageal cancer Neg Hx    Rectal cancer Neg Hx    Stomach cancer Neg Hx      Review of Systems: 12 systems were reviewed and negative except per HPI  Physical Exam: Vitals:   09/10/23 0027 09/10/23 0446  BP: (!) 152/76 101/60  Pulse: 81 75  Resp: 18 18  Temp: 98.7 F (37.1 C) 98.4 F (36.9 C)  SpO2: 90% 98%   No intake/output data recorded.  Intake/Output Summary (Last 24 hours) at 09/10/2023 0801 Last data filed at 09/10/2023 0600 Gross per 24 hour  Intake 700 ml  Output --  Net 700 ml   General: Tired appearing, lying in bed, no acute distress HEENT: anicteric sclera, MMM CV: normal rate, no murmurs, no edema Lungs: bilateral chest rise, normal wob Abd:  soft, non-tender, non-distended Skin: no visible lesions or rashes Psych: alert, engaged, appropriate mood and affect Neuro: normal speech, no gross focal deficits   Test Results Reviewed Lab Results  Component Value Date   NA 138 09/09/2023   K 2.4 (LL) 09/09/2023   CL 94 (L) 09/09/2023   CO2 27 09/09/2023   BUN 39 (H) 09/09/2023   CREATININE 10.40 (H) 09/09/2023   GFR 31.14 (L) 02/21/2014   CALCIUM 8.8 (L) 09/09/2023    ALBUMIN  3.6 09/09/2023   PHOS 6.2 (H) 01/24/2020    I have reviewed relevant outside healthcare records

## 2023-09-10 NOTE — Plan of Care (Signed)
 Pt alert and oriented x4. Resting in bed. Spouse visiting and supportive. PD in progress. No complaints voiced at present. Informed pt of new antibiotics order. Tolerating po potassium . Plan of care discussed with pt and his wife. Call light in reach. Sr x2 elevated. Bed in low position.

## 2023-09-10 NOTE — Progress Notes (Signed)
 Pharmacy Antibiotic Note  Adrian Frye is a 53 y.o. male on PD admitted on 09/09/2023 with peritonitis.  Pharmacy has been consulted for vancomycin  and ceftazidime dosing.  Plan: Ceftazidime 1g IV q24h Vancomycin  2500mg  IV x 1 - f/u levels PRN and redose when <20 mcg/ml Follow up cultures as available, clinical progress, length of tx   Height: 5\' 7"  (170.2 cm) Weight: 117 kg (258 lb) IBW/kg (Calculated) : 66.1  Temp (24hrs), Avg:98.6 F (37 C), Min:98.2 F (36.8 C), Max:99.1 F (37.3 C)  Recent Labs  Lab 09/09/23 1835 09/10/23 0548  WBC 14.7* 12.3*  CREATININE 10.40* 10.79*  LATICACIDVEN 1.1  --     Estimated Creatinine Clearance: 9.8 mL/min (A) (by C-G formula based on SCr of 10.79 mg/dL (H)).    Allergies  Allergen Reactions   Ace Inhibitors Anaphylaxis and Swelling   Lisinopril Swelling and Anaphylaxis    Angioedema  Other reaction(s): Angioedema (ALLERGY/intolerance)   Losartan Swelling and Anaphylaxis    Antimicrobials this admission: 4/27 Vancomycin  >> 4/27 Ceftazidime >>  Dose adjustments this admission:  Microbiology results: 4/27 Peritoneal fluid:  Thank you for allowing pharmacy to be a part of this patient's care.  Armanda Bern, PharmD, BCPS 09/10/2023 6:42 PM

## 2023-09-10 NOTE — Care Management Obs Status (Signed)
 MEDICARE OBSERVATION STATUS NOTIFICATION   Patient Details  Name: Hartwell Burkeen MRN: [id number] Date of Birth: [birth year]   Medicare Observation Status Notification Given:  Yes    Cindia Crease, RN 09/10/2023, 2:39 PM

## 2023-09-10 NOTE — Procedures (Signed)
PD tx initation note:    PD treatment initiated via aseptic technique. Consent signed and in chart. Patient is alert and oriented. No complaints of pain. No specimen collected. PD exit site clean, dry and intact. Gentamycin and new dressing applied. Bedside RN educated on PD machine and how to contact tech support when PD machine alarms.

## 2023-09-10 NOTE — Progress Notes (Signed)
 Cell count reviewed. Peritonitis. Recommend starting IV vanc and ceftaz for now. We can figure out IP abx tomorrow or at discharge since it seems that he has initiated PD already for tonight. Discussed with Dr. Sandria Cruise for antibiotic initiation.  Cristi Donalds, MD [facility]

## 2023-09-10 NOTE — H&P (Signed)
 History and Physical    Adrian Frye WGN:[id number] DOB: [birth year] DOA: 09/09/2023  PCP: Benedetta Bradley, MD   Patient coming from: Home   Chief Complaint: Abdominal pain, N/V   HPI: Adrian Frye is a 53 y.o. 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  Patient reports generalized abdominal pain for the past 2 weeks which was worse today.  He has had nausea and vomiting associated with this.  He has been constipated, was taking MiraLAX  at home without any relief, and then took a dose of lactulose .  He moved his bowels after the lactulose  and was feeling better but the symptoms then returned.  He denies fevers or chills associated with this and has not noticed any change in the appearance of his dialysate.  [facility] ED Course: Upon arrival to the ED, patient is found to be afebrile and saturating well on room air initially with normal HR and stable BP.  Labs are most notable for potassium 2.1, normal bicarbonate, BUN 39, WBC 14,700, hemoglobin 9.7, normal transaminases, normal total bilirubin, normal lipase, and normal lactate. CT of the abdomen pelvis is negative for acute findings.   ED PA discussed case with nephrology who recommended administering magnesium citrate.  Patient was treated with Dilaudid, Zofran , and potassium in the ED.  He was transferred to [facility] for admission.  Review of Systems:  All other systems reviewed and apart from HPI, are negative.  Past Medical History:  Diagnosis Date   Allergic rhinitis    Allergy    Anemia    Asthma    as a child   Bacteremia    Benign essential hypertension    Carotid artery stenosis, asymptomatic, left    1-39% left by dopplers 10/2022   CHF (congestive heart  failure) (HCC)    Dilated idiopathic cardiomyopathy (HCC)    now resoved with EF 55% by echo 2011   Edema    Encounter for blood transfusion    Endocarditis    ESRD (end stage renal disease) (HCC)    ESRD on peritoneal dialysis (HCC)    Heart disease    Hyperlipidemia    Hyperparathyroidism, secondary (HCC)    Hypertension    Morbid obesity (HCC)    Sleep apnea    c-pap   UGI bleed 2011   ASA    Past Surgical History:  Procedure Laterality Date   ACHILLES TENDON REPAIR     ruptured; right   AV FISTULA PLACEMENT Left 11/03/2017   Procedure: ARTERIOVENOUS (AV) FISTULA CREATION  left upper arm;  Surgeon: Mayo Speck, MD;  Location: [facility] OR;  Service: Vascular;  Laterality: Left;   COLONOSCOPY     COLONOSCOPY WITH PROPOFOL  N/A 08/11/2022   Procedure: COLONOSCOPY WITH PROPOFOL ;  Surgeon: Tobin Forts, MD;  Location: [facility];  Service: Gastroenterology;  Laterality: N/A;   DIALYSIS FISTULA CREATION     INSERTION OF DIALYSIS CATHETER N/A 11/03/2017   Procedure: INSERTION OF DIALYSIS CATHETER - RIGHT INTERNAL JUGULAR PLACEMENT;  Surgeon: Mayo Speck, MD;  Location: [facility] OR;  Service: Vascular;  Laterality: N/A;   IR FLUORO GUIDE CV LINE LEFT  01/24/2020   IR REMOVAL TUN CV CATH W/O FL  01/21/2020   IR REMOVAL TUN CV CATH W/O FL  03/06/2020   IR US  GUIDE VASC ACCESS LEFT  01/24/2020   KIDNEY FAILURE     LAPAROSCOPIC GASTROTOMY W/ REPAIR OF ULCER     PLEURAL SCARIFICATION     left, football trauma-chest tube   STOMACH SURGERY     TEE WITHOUT CARDIOVERSION N/A 01/22/2020   Procedure: TRANSESOPHAGEAL ECHOCARDIOGRAM (TEE);  Surgeon: Hazle Lites, MD;  Location: [facility] ENDOSCOPY;  Service: Cardiovascular;  Laterality: N/A;   TEE WITHOUT CARDIOVERSION N/A 10/31/2022   Procedure: TRANSESOPHAGEAL ECHOCARDIOGRAM;  Surgeon: Jacqueline Matsu, MD;  Location: [facility] INVASIVE CV LAB;  Service: Cardiovascular;  Laterality: N/A;   TONSILLECTOMY     TONSILLECTOMY     UPPER GASTROINTESTINAL  ENDOSCOPY      Social History:   reports that he quit smoking about 14 years ago. His smoking use included cigarettes. He started smoking about 20 years ago. He has never used smokeless tobacco. He reports that he does not currently use alcohol after a past usage of about 2.0 standard drinks of alcohol per week. He reports that he does not use drugs.  Allergies  Allergen Reactions   Ace Inhibitors Anaphylaxis and Swelling   Lisinopril Swelling and Anaphylaxis    Angioedema  Other reaction(s): Angioedema (ALLERGY/intolerance)   Losartan Swelling and Anaphylaxis    Family History  Problem Relation Age of Onset   Diabetes Mother    Heart failure Mother    Hypertension Mother    Heart disease Father    Hypertension Brother    Hypertension Brother    Colon cancer Neg Hx    Esophageal cancer Neg Hx    Rectal cancer Neg Hx    Stomach cancer Neg Hx      Prior to Admission medications   Medication Sig Start Date End Date Taking? Authorizing Provider  albuterol  (VENTOLIN  HFA) 108 (90 Base) MCG/ACT inhaler TAKE 2 PUFFS BY MOUTH EVERY 6 HOURS AS NEEDED FOR WHEEZE OR SHORTNESS OF BREATH 09/14/20   Henson, Vickie L, NP-C  amLODipine  (NORVASC ) 10 MG tablet Take 1/2 tablet by mouth once daily, start at 1/2 tablet daily & if most BPs remain > 150 on top # increase to 1 full tablet daily 05/24/23     amLODipine  (NORVASC ) 2.5 MG tablet TAKE 1 TABLET BY MOUTH EVERY DAY 12/08/22   Jacqueline Matsu, MD  AURYXIA  1 GM 210 MG(Fe) tablet Take 630 mg by mouth 3 (three) times daily with meals. 07/05/21   [provider]  B Complex-C-Folic Acid  (DIALYVITE 800 PO) Take 1 tablet by mouth daily with lunch.    [provider]  calcitRIOL  (ROCALTROL ) 0.5 MCG capsule Take 0.5 mcg by mouth daily with lunch. 04/02/20   [provider]  carvedilol  (COREG ) 25 MG tablet Take 1 tablet (25 mg total) by mouth 2 (two) times daily. 07/05/23     carvedilol  (COREG ) 3.125 MG tablet Take 1 tablet (3.125 mg  total) by mouth 2 (two) times daily with a meal in the morning and in the evening 05/09/23     carvedilol  (COREG ) 6.25 MG tablet Take 1 tablet (6.25 mg total) by mouth 2 (two) times daily with food 06/12/23     Continuous Glucose Sensor (DEXCOM G7 SENSOR) MISC Apply sensor to skin every 10 days 05/19/23     gentamicin  cream (GARAMYCIN ) 0.1 % Apply 1 Application topically daily as needed (catheter dressing change).    [provider]  hydrALAZINE  (APRESOLINE ) 50 MG tablet Take 1 tablet (50 mg total) by mouth 2 (two) times daily as needed for blood pressure 180/90 or higher  06/12/23     hydrALAZINE  (APRESOLINE ) 50 MG tablet Take 1 tablet (50 mg total) by mouth every 8 (eight) hours with food 07/03/23     lactulose  (CHRONULAC ) 10 GM/15ML solution Take 60 mLs (40 g total) by mouth every 1 hours for max of 4 doses. If  no relief call RN/MD. 09/08/23     Methoxy PEG-Epoetin  Beta (MIRCERA IJ) Inject into the skin. As needed 02/07/20   [provider]  ondansetron  (ZOFRAN -ODT) 4 MG disintegrating tablet Take 1 tablet (4 mg total) by mouth every 6 (six) hours as needed. 08/29/23     spironolactone  (ALDACTONE ) 25 MG tablet Take 1 tablet (25 mg total) by mouth daily. 07/31/23     tirzepatide  (MOUNJARO ) 2.5 MG/0.5ML Pen Inject 2.5 mg into the skin once a week. 05/01/23     tirzepatide  (MOUNJARO ) 5 MG/0.5ML Pen Inject 5 mg into the skin once a week. 09/05/23     tirzepatide  (MOUNJARO ) 7.5 MG/0.5ML Pen Inject 7.5 mg into the skin once a week. 08/02/23     traZODone  (DESYREL ) 50 MG tablet Take 200 mg by mouth at bedtime. 08/09/22   [provider]  traZODone  (DESYREL ) 50 MG tablet Take 4 tablets (200 mg total) by mouth at bedtime. 08/28/23       Physical Exam: Vitals:   09/09/23 2130 09/09/23 2200 09/09/23 2300 09/10/23 0027  BP: (!) 151/79 (!) 148/81 (!) 156/80 (!) 152/76  Pulse: 81 79 80 81  Resp: (!) 28 (!) 23 (!) 30 18  Temp:   98.2 F (36.8 C) 98.7 F (37.1 C)  TempSrc:   Oral Oral   SpO2: 98% 97% 100% 90%  Weight:      Height:        Constitutional: NAD, no pallor or diaphoresis  Eyes: PERTLA, lids and conjunctivae normal ENMT: Mucous membranes are moist. Posterior pharynx clear of any exudate or lesions.   Neck: supple, no masses  Respiratory: no wheezing, no crackles. No accessory muscle use.  Cardiovascular: S1 & S2 heard, regular rate and rhythm. No JVD. Abdomen: Soft, distended, generally tender, no rebound pain. Bowel sounds active.  Musculoskeletal: no clubbing / cyanosis. No joint deformity upper and lower extremities.   Skin: no significant rashes, lesions, ulcers. Warm, dry, well-perfused. Neurologic: CN 2-12 grossly intact. Moving all extremities. Alert and oriented.  Psychiatric: Calm. Cooperative.    Labs and Imaging on Admission: I have personally reviewed following labs and imaging studies  CBC: Recent Labs  Lab 09/09/23 1835  WBC 14.7*  HGB 9.7*  HCT 28.4*  MCV 89.9  PLT 198   Basic Metabolic Panel: Recent Labs  Lab 09/09/23 1835 09/09/23 2248  NA 138  --   K 2.1* 2.4*  CL 94*  --   CO2 27  --   GLUCOSE 105*  --   BUN 39*  --   CREATININE 10.40*  --   CALCIUM 8.8*  --   MG 1.7  --    GFR: Estimated Creatinine Clearance: 10.2 mL/min (A) (by C-G formula based on SCr of 10.4 mg/dL (H)). Liver Function Tests: Recent Labs  Lab 09/09/23 1835  AST 15  ALT 16  ALKPHOS 75  BILITOT 0.4  PROT 5.9*  ALBUMIN  3.6   Recent Labs  Lab 09/09/23 1835  LIPASE 51   No results for input(s): "AMMONIA" in the last 168 hours. Coagulation Profile: No results for input(s): "INR", "PROTIME" in the last 168 hours. Cardiac Enzymes: No results for input(s): "CKTOTAL", "CKMB", "CKMBINDEX", "TROPONINI"  in the last 168 hours. BNP (last 3 results) No results for input(s): "PROBNP" in the last 8760 hours. HbA1C: No results for input(s): "HGBA1C" in the last 72 hours. CBG: No results for input(s): "GLUCAP" in the last 168 hours. Lipid  Profile: No results for input(s): "CHOL", "HDL", "LDLCALC", "TRIG", "CHOLHDL", "LDLDIRECT" in the last 72 hours. Thyroid  Function Tests: No results for input(s): "TSH", "T4TOTAL", "FREET4", "T3FREE", "THYROIDAB" in the last 72 hours. Anemia Panel: No results for input(s): "VITAMINB12", "FOLATE", "FERRITIN", "TIBC", "IRON ", "RETICCTPCT" in the last 72 hours. Urine analysis:    Component Value Date/Time   COLORURINE STRAW (A) 11/02/2017 2208   APPEARANCEUR CLEAR 11/02/2017 2208   LABSPEC 1.011 11/02/2017 2208   PHURINE 5.0 11/02/2017 2208   GLUCOSEU 50 (A) 11/02/2017 2208   HGBUR NEGATIVE 11/02/2017 2208   BILIRUBINUR NEGATIVE 11/02/2017 2208   KETONESUR 5 (A) 11/02/2017 2208   PROTEINUR 100 (A) 11/02/2017 2208   UROBILINOGEN 0.2 09/15/2009 2240   NITRITE NEGATIVE 11/02/2017 2208   LEUKOCYTESUR NEGATIVE 11/02/2017 2208   Sepsis Labs: @LABRCNTIP (procalcitonin:4,lacticidven:4) )No results found for this or any previous visit (from the past 240 hours).   Radiological Exams on Admission: CT ABDOMEN PELVIS W CONTRAST Result Date: 09/09/2023 EXAM: CT ABDOMEN AND PELVIS WITH CONTRAST 09/09/2023 08:19:53 PM TECHNIQUE: CT of the abdomen and pelvis was performed with intravenous contrast. Multiplanar reformatted images are provided for review. Automated exposure control, iterative reconstruction, and/or weight based adjustment of the mA/kV was utilized to reduce the radiation dose to as low as reasonably achievable. CONTRAST: 75mL iohexol (OMNIPAQUE) 300 MG/ML solution 100 mL IOHEXOL 300 MG/ML SOLN COMPARISON: None available. CLINICAL HISTORY: Abdominal pain, acute, nonlocalized; PD patient, worsening abdominal pain for 2 weeks, vomiting. Peritoneal dialysis patient. Last treatment last night but got less fluid off than normal. Abdominal pain starting 1-2 weeks ago. Pain worse today. Increased Monjaro dose 2 weeks ago. Tried Miralax  and lactulose  for constipation. Positive vomiting, negative  diarrhea. Denies shortness of breath, chest pain. Dr. Lynder Sanger from body reading room okayed study via verbal phone call. FINDINGS: LOWER CHEST: Mild bibasilar scarring. HEPATOBILIARY: Layering gallbladder sludge without associated inflammatory changes. SPLEEN: No acute abnormality. PANCREAS: No acute abnormality. ADRENAL GLANDS: No acute abnormality. KIDNEYS, URETERS: Bilateral renal atrophy with 2.4 cm simple medial right upper pole renal cyst (image 39), benign (Bosniak 1). Bilateral non-obstructive renal calculi measuring up to 3 mm. No evidence of hydronephrosis. No evidence of perinephric or periureteral stranding. GI AND BOWEL: Normal appendix (image 57). Stomach demonstrates no acute abnormality. There is no evidence of bowel obstruction. No evidence of appendicitis. PELVIS: Urinary bladder is decompressed. Peritoneal dialysis catheter in the left lower pelvis with associated mild abdominopelvic ascites. PERITONEUM AND RETROPERITONEUM: No evidence of free air. LYMPH NODES: No evidence of lymphadenopathy. VASCULATURE: Atherosclerotic calcifications of the abdominal aorta and branch vessels. BONES AND SOFT TISSUES: No acute osseous abnormality. No focal soft tissue abnormality. LIMITATIONS/ARTIFACTS: Motion artifact. Incidental adrenal and/or renal findings do not require follow up imaging. IMPRESSION: 1. No acute findings. 2. Peritoneal dialysis catheter with mild abdominopelvic ascites. 3. Layering gallbladder sludge without associated inflammatory changes. 4. Bilateral non-obstructive renal calculi measuring up to 3 mm. No hydronephrosis. Electronically signed by: Zadie Herter MD 09/09/2023 08:32 PM EDT RP Workstation: [id number]    EKG: Independently reviewed. Sinus rhythm, PVCs, non-specific IVCD.   Assessment/Plan   1. Hypokalemia   - He was given IV and oral potassium replacement  - Continue cardiac monitoring, repeat chem panel   2. Abdominal pain,  N/V  - No acute findings seen on CT,  no fevers/chills, improved when he moved his bowels after lactulose  recently but sxs returned  - Treat constipation, continue pain-control and as-needed antiemetics, consider drawing peritoneal fluid for Gram stain and culture  3. ESRD on PD  - Restrict fluids, renally-dose medications, consult nephrology in AM for PD while in hospital   4. Hypertension  - Coreg , hydralazine , Aldactone    5. OSA  - CPAP while sleeping     DVT prophylaxis: sq heparin   Code Status: Full  Level of Care: Level of care: Telemetry Medical Family Communication: Wife at bedside  Disposition Plan:  Patient is from: Home  Anticipated d/c is to: Home  Anticipated d/c date is: 4/27 or 09/11/23  Patient currently: Pending correction of electrolytes, pain-control  Consults called: None  Admission status: Observation     Walton Guppy, MD [facility]  09/10/2023, 1:23 AM

## 2023-09-10 NOTE — Plan of Care (Signed)

## 2023-09-10 NOTE — Plan of Care (Signed)
  Problem: Education: Goal: Knowledge of General Education information will improve Description: Including pain rating scale, medication(s)/side effects and non-pharmacologic comfort measures Outcome: Progressing   Problem: Health Behavior/Discharge Planning: Goal: Ability to manage health-related needs will improve Outcome: Progressing   Problem: Clinical Measurements: Goal: Ability to maintain clinical measurements within normal limits will improve 09/10/2023 2329 by Desmond Florida, RN Outcome: Progressing 09/10/2023 2325 by Desmond Florida, RN Outcome: Progressing Goal: Will remain free from infection 09/10/2023 2329 by Desmond Florida, RN Outcome: Progressing 09/10/2023 2325 by Desmond Florida, RN Outcome: Progressing Goal: Diagnostic test results will improve 09/10/2023 2329 by Desmond Florida, RN Outcome: Progressing 09/10/2023 2325 by Desmond Florida, RN Outcome: Progressing Goal: Cardiovascular complication will be avoided 09/10/2023 2329 by Desmond Florida, RN Outcome: Progressing 09/10/2023 2325 by Desmond Florida, RN Outcome: Progressing   Problem: Coping: Goal: Level of anxiety will decrease 09/10/2023 2329 by Desmond Florida, RN Outcome: Progressing 09/10/2023 2325 by Desmond Florida, RN Outcome: Progressing   Problem: Safety: Goal: Ability to remain free from injury will improve Outcome: Progressing

## 2023-09-10 NOTE — Progress Notes (Signed)
 PROGRESS NOTE    Adrian Frye  GNF:[id number] DOB: 12-May-1971 DOA: 09/09/2023 PCP: Benedetta Bradley, MD  Outpatient Specialists:     Brief Narrative:  Patient is a 53 year old male, morbidly obese, past medical history significant for end-stage renal disease on peritoneal dialysis, severe pulmoniv valve regurgitation following endocarditis, diet-controlled diabetes mellitus, insomnia and hypertension.  Patient was admitted with abdominal pain, nausea, vomiting and low-grade fever.  Peritoneal fluid analysis just resulted, with nucleated cell count of 53,530 and 80% neutrophil.  Discussed with nephrology team.  Also consulted infectious disease team, Dr. Arzella Laurence.  Significant hypokalemia was noted on presentation.  Patient has had constipation for a couple of weeks.   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 - Follow final cultures. - Infectious disease team and nephrology to advise regarding antibiotics. -Avoid constipation.  Severe hypokalemia: - Admitted with potassium of 2.1. - Repeat potassium 3.1. - Continue to replete potassium. - Repeat magnesium as well. - Hypokalemia may be contributed to constipation.  Constipation: - Laxatives. - Avoid constipation as patient is on peritoneal dialysis.  ESRD on peritoneal dialysis: - Nephrology is managing.  Hypertension: - Continue to optimize.  OSA: - CPAP at nighttime.  Morbid obesity: - Diet and exercise. - Consider Ozempic or Mounjaro  i.e. if not contraindicated.   DVT prophylaxis: Subcutaneous heparin . Code Status: Full code Family Communication: Wife Disposition Plan:  Inpatient   Consultants:  Nephrology. Infectious disease  Procedures:  None  Antimicrobials:  Infectious disease and nephrology team to advise.   Subjective: Abdominal pain Constipation  Objective: Vitals:   09/09/23 2300 09/10/23 0027 09/10/23 0446 09/10/23 0815  BP: (!) 156/80 (!) 152/76 101/60 (!) 141/61  Pulse: 80 81 75 84  Resp: (!) 30 18 18 18   Temp: 98.2 F (36.8 C) 98.7 F (37.1 C) 98.4 F (36.9 C)   TempSrc: Oral Oral Oral   SpO2: 100% 90% 98% 93%  Weight:      Height:        Intake/Output Summary (Last 24 hours) at 09/10/2023 1324 Last data filed at 09/10/2023 0600 Gross per 24 hour  Intake 700 ml  Output --  Net 700 ml   Filed Weights   09/09/23 1842  Weight: 117 kg    Examination:  General exam: Appears calm and comfortable.  Patient is morbidly obese. Respiratory system: Clear to auscultation.  Cardiovascular system: S1 & S2 heard. Gastrointestinal system: Abdomen is obese nontender.   Central nervous system: Awake and alert.  Patient moves all extremities.   Extremities:    Data Reviewed: I have personally reviewed following labs and imaging studies  CBC: Recent Labs  Lab 09/09/23 1835 09/10/23 0548  WBC 14.7* 12.3*  HGB 9.7* 8.3*  HCT 28.4* 24.9*  MCV 89.9 90.2  PLT 198 180   Basic Metabolic Panel: Recent Labs  Lab 09/09/23 1835 09/09/23 2248 09/10/23 0548  NA 138  --  135  K 2.1* 2.4* 2.5*  CL 94*  --  94*  CO2 27  --  26  GLUCOSE 105*  --  110*  BUN 39*  --  42*  CREATININE 10.40*  --  10.79*  CALCIUM 8.8*  --  7.8*  MG 1.7  --   --   PHOS  --   --  4.9*   GFR: Estimated Creatinine Clearance: 9.8 mL/min (A) (by C-G formula based on SCr of 10.79 mg/dL (H)). Liver Function Tests: Recent Labs  Lab 09/09/23 1835  AST 15  ALT 16  ALKPHOS 75  BILITOT 0.4  PROT 5.9*  ALBUMIN  3.6   Recent Labs  Lab 09/09/23 1835  LIPASE 51   No results for input(s): "AMMONIA" in the last 168 hours. Coagulation Profile: No  results for input(s): "INR", "PROTIME" in the last 168 hours. Cardiac Enzymes: No results for input(s): "CKTOTAL", "CKMB", "CKMBINDEX", "TROPONINI" in the last 168 hours. BNP (last 3 results) No results for input(s): "PROBNP" in the last 8760 hours. HbA1C: No results for input(s): "HGBA1C" in the last 72 hours. CBG: No results for input(s): "GLUCAP" in the last 168 hours. Lipid Profile: No results for input(s): "CHOL", "HDL", "LDLCALC", "TRIG", "CHOLHDL", "LDLDIRECT" in the last 72 hours. Thyroid  Function Tests: No results for input(s): "TSH", "T4TOTAL", "FREET4", "T3FREE", "THYROIDAB" in the last 72 hours. Anemia Panel: Recent Labs    09/10/23 0548  FERRITIN 1,323*  TIBC 116*  IRON  24*   Urine analysis:    Component Value Date/Time   COLORURINE STRAW (A) 11/02/2017 2208   APPEARANCEUR CLEAR 11/02/2017 2208   LABSPEC 1.011 11/02/2017 2208   PHURINE 5.0 11/02/2017 2208   GLUCOSEU 50 (A) 11/02/2017 2208   HGBUR NEGATIVE 11/02/2017 2208   BILIRUBINUR NEGATIVE 11/02/2017 2208   KETONESUR 5 (A) 11/02/2017 2208   PROTEINUR 100 (A) 11/02/2017 2208   UROBILINOGEN 0.2 09/15/2009 2240   NITRITE NEGATIVE 11/02/2017 2208   LEUKOCYTESUR NEGATIVE 11/02/2017 2208   Sepsis Labs: @LABRCNTIP (procalcitonin:4,lacticidven:4)  )No results found for this or any previous visit (from the past 240 hours).       Radiology Studies: CT ABDOMEN PELVIS W CONTRAST Result Date: 09/09/2023 EXAM: CT ABDOMEN AND PELVIS WITH CONTRAST 09/09/2023 08:19:53 PM TECHNIQUE: CT of the abdomen and pelvis was performed with intravenous contrast. Multiplanar reformatted images are provided for review. Automated exposure control, iterative reconstruction, and/or weight based adjustment of the mA/kV was utilized to reduce the radiation dose to as low as reasonably achievable. CONTRAST: 75mL iohexol (OMNIPAQUE) 300 MG/ML solution 100 mL IOHEXOL 300 MG/ML SOLN COMPARISON: None available. CLINICAL HISTORY: Abdominal  pain, acute, nonlocalized; PD patient, worsening abdominal pain for 2 weeks, vomiting. Peritoneal dialysis patient. Last treatment last night but got less fluid off than normal. Abdominal pain starting 1-2 weeks ago. Pain worse today. Increased Monjaro dose 2 weeks ago. Tried Miralax  and lactulose  for constipation. Positive vomiting, negative diarrhea. Denies shortness of breath, chest pain. Dr. Lynder Sanger from body reading room okayed study via verbal phone call. FINDINGS: LOWER CHEST: Mild bibasilar scarring. HEPATOBILIARY: Layering gallbladder sludge without associated inflammatory changes. SPLEEN: No acute abnormality. PANCREAS: No acute abnormality. ADRENAL GLANDS: No acute abnormality. KIDNEYS, URETERS: Bilateral renal atrophy with 2.4 cm simple medial right upper pole renal cyst (image 39), benign (Bosniak 1). Bilateral non-obstructive renal calculi measuring up to 3 mm. No evidence of hydronephrosis. No evidence of perinephric or periureteral stranding. GI AND BOWEL: Normal appendix (image 57). Stomach demonstrates no acute abnormality. There is no evidence of bowel obstruction. No evidence of appendicitis. PELVIS: Urinary bladder is decompressed. Peritoneal dialysis catheter in the left lower pelvis with associated mild abdominopelvic ascites. PERITONEUM AND RETROPERITONEUM: No evidence of free air. LYMPH NODES: No evidence of lymphadenopathy. VASCULATURE: Atherosclerotic calcifications of the abdominal aorta and  branch vessels. BONES AND SOFT TISSUES: No acute osseous abnormality. No focal soft tissue abnormality. LIMITATIONS/ARTIFACTS: Motion artifact. Incidental adrenal and/or renal findings do not require follow up imaging. IMPRESSION: 1. No acute findings. 2. Peritoneal dialysis catheter with mild abdominopelvic ascites. 3. Layering gallbladder sludge without associated inflammatory changes. 4. Bilateral non-obstructive renal calculi measuring up to 3 mm. No hydronephrosis. Electronically signed by:  Sriyesh Krishnan MD 09/09/2023 08:32 PM EDT RP Workstation: [id number]        Scheduled Meds:  calcitRIOL   0.5 mcg Oral Q lunch   carvedilol   12.5 mg Oral BID WC   ferric citrate   630 mg Oral TID WC   gentamicin  cream  1 Application Topical Daily   heparin   5,000 Units Subcutaneous Q8H   hydrALAZINE   50 mg Oral Q8H   potassium chloride   40 mEq Oral Q4H   sodium chloride  flush  3 mL Intravenous Q12H   spironolactone   25 mg Oral Daily   traZODone   200 mg Oral QHS   Continuous Infusions:  dialysis solution 1.5% low-MG/low-CA     dialysis solution 2.5% low-MG/low-CA     dialysis solution 4.25% low-MG/low-CA       LOS: 0 days    Time spent: 35 minutes.    Fonnie Iba, MD  [facility] Pager #: [phone] 7PM-7AM contact night coverage as above

## 2023-09-11 DIAGNOSIS — K659 Peritonitis, unspecified: Secondary | ICD-10-CM | POA: Diagnosis not present

## 2023-09-11 DIAGNOSIS — R1033 Periumbilical pain: Secondary | ICD-10-CM

## 2023-09-11 DIAGNOSIS — N2581 Secondary hyperparathyroidism of renal origin: Secondary | ICD-10-CM | POA: Diagnosis not present

## 2023-09-11 DIAGNOSIS — N186 End stage renal disease: Secondary | ICD-10-CM | POA: Diagnosis not present

## 2023-09-11 DIAGNOSIS — Z992 Dependence on renal dialysis: Secondary | ICD-10-CM | POA: Diagnosis not present

## 2023-09-11 DIAGNOSIS — D631 Anemia in chronic kidney disease: Secondary | ICD-10-CM | POA: Diagnosis not present

## 2023-09-11 DIAGNOSIS — Z4932 Encounter for adequacy testing for peritoneal dialysis: Secondary | ICD-10-CM | POA: Diagnosis not present

## 2023-09-11 LAB — CBC
HCT: 25 % — ABNORMAL LOW (ref 39.0–52.0)
Hemoglobin: 8.2 g/dL — ABNORMAL LOW (ref 13.0–17.0)
MCH: 29.8 pg (ref 26.0–34.0)
MCHC: 32.8 g/dL (ref 30.0–36.0)
MCV: 90.9 fL (ref 80.0–100.0)
Platelets: 191 10*3/uL (ref 150–400)
RBC: 2.75 MIL/uL — ABNORMAL LOW (ref 4.22–5.81)
RDW: 16.6 % — ABNORMAL HIGH (ref 11.5–15.5)
WBC: 8.6 10*3/uL (ref 4.0–10.5)
nRBC: 0 % (ref 0.0–0.2)

## 2023-09-11 LAB — BASIC METABOLIC PANEL WITH GFR
Anion gap: 14 (ref 5–15)
BUN: 39 mg/dL — ABNORMAL HIGH (ref 6–20)
CO2: 23 mmol/L (ref 22–32)
Calcium: 7.9 mg/dL — ABNORMAL LOW (ref 8.9–10.3)
Chloride: 96 mmol/L — ABNORMAL LOW (ref 98–111)
Creatinine, Ser: 9.82 mg/dL — ABNORMAL HIGH (ref 0.61–1.24)
GFR, Estimated: 6 mL/min — ABNORMAL LOW (ref >60)
Glucose, Bld: 123 mg/dL — ABNORMAL HIGH (ref 70–99)
Potassium: 3.3 mmol/L — ABNORMAL LOW (ref 3.5–5.1)
Sodium: 133 mmol/L — ABNORMAL LOW (ref 135–145)

## 2023-09-11 LAB — PATHOLOGIST SMEAR REVIEW

## 2023-09-11 MED ORDER — POLYETHYLENE GLYCOL 3350 17 G PO PACK
17.0000 g | PACK | Freq: Every day | ORAL | Status: DC
  Administered 2023-09-11 – 2023-09-13 (×3): 17 g via ORAL
  Filled 2023-09-11 (×3): qty 1

## 2023-09-11 MED ORDER — DELFLEX-LC/2.5% DEXTROSE 394 MOSM/L IP SOLN: INTRAPERITONEAL | Status: DC

## 2023-09-11 MED ORDER — HEPARIN SODIUM (PORCINE) 1000 UNIT/ML IJ SOLN
INTRAPERITONEAL | Status: DC | PRN
  Filled 2023-09-11 (×3): qty 6000

## 2023-09-11 NOTE — Progress Notes (Signed)
 RT NOTE: PT has home unit CPAP at bedside. PT says is not ready to go on CPAP yet, but needs no assistance with going on CPAP for the night. RT told PT to call if needs help with CPAP when ready to go on.

## 2023-09-11 NOTE — Progress Notes (Signed)
 Pt rested this shift. Tolerated IV antibiotics of Fortaz and Vancomycin . No complaints of pain. Call light in reach. Sr x2 elevated. Bed in low position.

## 2023-09-11 NOTE — Progress Notes (Signed)
 PD post treatment note  PD treatment completed. Patient tolerated treatment well. PD effluent is clear. No specimen collected.  PD exit site clean, dry and intact. Patient is awake, oriented and in no acute distress.  Report given to bedside nurse.   Post treatment VS: see 8am vitals on flowsheet    09/11/23 0803  Peritoneal Catheter Left lower abdomen  No placement date or time found.   Catheter Location: Left lower abdomen  Site Assessment Clean, Dry, Intact  Drainage Description None  Catheter status Deaccessed  Dressing Gauze/Drain sponge  Dressing Status Clean, Dry, Intact  Dressing Intervention Assessed, no intervention needed  Completion  Treatment Status Complete  Initial Drain Volume 5  Average Dwell Time-Hour(s) 1  Average Dwell Time-Min(s) 30  Average Drain Time 35 (minutes)  Total Therapy Volume 19147  Total Therapy Time-Hour(s) 14  Total Therapy Time-Min(s) 43  Weight after Drain 272 lb 14.9 oz (123.8 kg)  Effluent Appearance Yellow;Cloudy  Fluid Balance - CCPD  Total UF (- value on cycler, pt gain) 1173 mL  Procedure Comments  Tolerated treatment well? Yes  Peritoneal Dialysis Comments Patient end session had slow drainage  Hand-off documentation  Hand-off Given Given to shift RN/LPN  Report given to (Full Name) Benjaman Branch RN       Post treatment weight: 123.8kg

## 2023-09-11 NOTE — Consult Note (Signed)
 [facility]    Date of Admission:  09/09/2023     Total days of antibiotics 1               Reason for Consult: Peritonitis   Referring Provider: Dr. Sandria Cruise Primary Care Provider: Benedetta Bradley, MD   ASSESSMENT:  Adrian Frye is a 53 year old African-American with end-stage renal disease on peritoneal dialysis admitted with abdominal pain peritonitis with cultures of peritoneal fluid showing Staphylococcus epidermidis.  Sensitivities remain pending and will continue current dose of vancomycin  and discontinue ceftazidime.  May need to consider catheter removal/exchange if effluent does not clear with after 5 days of appropriate antibiotic therapy.  Anticipate at least 2 weeks of antimicrobial therapy. Therapeutic drug monitoring of vancomycin  levels.  Monitor cultures for sensitivities and narrow antibiotics as appropriate.  Continue standard/universal precautions. Remaining medical and supportive care per internal medicine and nephrology.  PLAN:  Continue current dose of vancomycin . Monitor cultures for sensitivities and narrow antibiotics as appropriate. Therapeutic drug monitoring of vancomycin  levels. Continue standard/universal precautions Renal replacement therapy per nephrology. Remaining medical supportive care per internal medicine.   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 male with previous medical history of hypertension, diabetes, end-stage renal disease on peritoneal dialysis and history of endocarditis presenting with worsening abdominal pain and vomiting.  Adrian Frye presented with 1 to 2-week history of increasing abdominal pain.  Had also increased his dose of Mounjaro  around that time.  Developed constipation that was refractory to lactulose .  Afebrile on admission with white blood cell count of 14,700.  Hypokalemia with potassium of 2.1. CT abdomen pelvis with no acute findings.  Noted to have issues with inadequate draining and frequent alarms with his peritoneal dialysis.  There was no cloudy or bloody effluent.  Peritoneal fluid sent for evaluation showing 53,530 total nucleated cell count with 87% neutrophils and cloudy appearance consistent with peritonitis.  Peritoneal fluid with gram-positive cocci on Gram stain and Staphylococcus epidermidis on culture with sensitivities pending.  Initially started on ceftazidime and vancomycin .  Adrian Frye is on day 2 of antimicrobial therapy and tolerating vancomycin  with no adverse side effects. Remains afebrile with improvement in WBC count down to 8,600. Has been feeling off for a couple of weeks.   Review of Systems: Review of Systems  Constitutional:  Negative for chills, fever and weight loss.  Respiratory:  Negative for cough, shortness of breath and wheezing.   Cardiovascular:  Negative for chest pain and leg swelling.  Gastrointestinal:  Negative for abdominal pain, constipation, diarrhea, nausea and  vomiting.  Skin:  Negative for rash.     Past Medical History:  Diagnosis Date   Allergic rhinitis    Allergy    Anemia    Asthma    as a child   Bacteremia    Benign essential hypertension    Carotid artery stenosis, asymptomatic,  left    1-39% left by dopplers 10/2022   CHF (congestive heart failure) (HCC)    Dilated idiopathic cardiomyopathy (HCC)    now resoved with EF 55% by echo 2011   Edema    Encounter for blood transfusion    Endocarditis    ESRD (end stage renal disease) (HCC)    ESRD on peritoneal dialysis (HCC)    Heart disease    Hyperlipidemia    Hyperparathyroidism, secondary (HCC)    Hypertension    Morbid obesity (HCC)    Sleep apnea    c-pap   UGI bleed 2011   ASA    Social History   Tobacco Use   Smoking status: Former    Current packs/day: 0.00    Types: Cigarettes    Start date: 05/17/2003    Quit date: 05/16/2009    Years since quitting: 14.3   Smokeless tobacco: Never   Tobacco comments:    smoked 1 pack per week   Vaping Use   Vaping status: Never Used  Substance Use Topics   Alcohol use: Not Currently    Alcohol/week: 2.0 standard drinks of alcohol    Types: 2 Cans of beer per week    Comment: occasionally   Drug use: No    Family History  Problem Relation Age of Onset   Diabetes Mother    Heart failure Mother    Hypertension Mother    Heart disease Father    Hypertension Brother    Hypertension Brother    Colon cancer Neg Hx    Esophageal cancer Neg Hx    Rectal cancer Neg Hx    Stomach cancer Neg Hx     Allergies  Allergen Reactions   Ace Inhibitors Anaphylaxis and Swelling   Lisinopril Swelling and Anaphylaxis    Angioedema  Other reaction(s): Angioedema (ALLERGY/intolerance)   Losartan Swelling and Anaphylaxis   Cat Dander Rash    OBJECTIVE: Blood pressure (!) 148/75, pulse 79, temperature 99.3 F (37.4 C), temperature source Oral, resp. rate 18, height 5\' 7"  (1.702 m), weight 125.5 kg, SpO2 95%.  Physical Exam Constitutional:      General: He is not in acute distress.    Appearance: He is well-developed.  Cardiovascular:     Rate and Rhythm: Normal rate and regular rhythm.     Heart sounds: Normal heart sounds.  Pulmonary:     Effort:  Pulmonary effort is normal.     Breath sounds: Normal breath sounds.  Skin:    General: Skin is warm and dry.  Neurological:     Mental Status: He is alert and oriented to person, place, and time.  Psychiatric:        Behavior: Behavior normal.        Thought Content: Thought content normal.        Judgment: Judgment normal.     Lab Results Lab Results  Component Value Date   WBC 8.6 09/11/2023   HGB 8.2 (L) 09/11/2023   HCT 25.0 (L) 09/11/2023   MCV 90.9 09/11/2023   PLT 191 09/11/2023    Lab Results  Component Value Date   CREATININE 9.82 (H) 09/11/2023  BUN 39 (H) 09/11/2023   NA 133 (L) 09/11/2023   K 3.3 (L) 09/11/2023   CL 96 (L) 09/11/2023   CO2 23 09/11/2023    Lab Results  Component Value Date   ALT 16 09/09/2023   AST 15 09/09/2023   ALKPHOS 75 09/09/2023   BILITOT 0.4 09/09/2023     Microbiology: Recent Results (from the past 240 hours)  Body fluid culture w Gram Stain     Status: None (Preliminary result)   Collection Time: 09/10/23  7:27 AM   Specimen: Peritoneal Washings; Body Fluid  Result Value Ref Range Status   Specimen Description PERITONEAL  Final   Special Requests NONE  Final   Gram Stain   Final    MODERATE WBC PRESENT, PREDOMINANTLY PMN RARE GRAM POSITIVE COCCI    Culture   Final    FEW STAPHYLOCOCCUS EPIDERMIDIS CULTURE REINCUBATED FOR BETTER GROWTH SUSCEPTIBILITIES TO FOLLOW Performed at [facility], [address], [place], [place] [address]    Report Status PENDING  Incomplete    I have personally spent 30 minutes involved in face-to-face and non-face-to-face activities for this patient on the day of the visit. Professional time spent includes the following activities: Preparing to see the patient (review of tests), Obtaining and/or reviewing separately obtained history (admission/discharge record), Performing a medically appropriate examination and/or evaluation , Ordering medications/tests/procedures, referring and  communicating with other health care professionals, Documenting clinical information in the EMR, Independently interpreting results (not separately reported), Communicating results to the patient/family/caregiver, Counseling and educating the patient/family/caregiver and Care coordination (not separately reported).    Greg Rickey Sadowski, NP [facility]  09/11/2023  3:46 PM

## 2023-09-11 NOTE — Progress Notes (Signed)
 [facility] Progress Note  Subjective:  Seen in room TNC yest was 53,530 PD fluid culture gram stain +for GPC and culture + for staph epi Abdomen remains tender  Vitals:   09/11/23 0516 09/11/23 0548 09/11/23 0800 09/11/23 1318  BP: 121/73  133/72 (!) 148/75  Pulse: 70   79  Resp: 18  18 18   Temp: 97.9 F (36.6 C)  98.2 F (36.8 C) 99.3 F (37.4 C)  TempSrc: Oral  Oral Oral  SpO2: 100%  93% 95%  Weight:  125.5 kg    Height:        Exam:  alert, nad   no jvd  Chest cta bilat  Cor reg no RG  Abd soft but very tender when compressed   Ext no LE edema   Alert, NF, ox3    PD cath mid-abdomen intact  TNC on PD fluid 4/27: 53,350  GS/ culture 4/27 on PD fluid: GS +GPC, culture + staph epi                OP PD: Home Therap[facility] 6 cycles, 3L fill, 1.5hrs, 121kg, last fill 2L amlodipine  10mg , auryxia  3 tabs, calcitriol  0.45mcg, coreg  12.5 bid, spiro 25, xphoza   Assessment/Recommendations: Adrian Frye is a/an 53 y.o. male with a past medical history notable for ESRD on HD admitted with abdominal pain.    Problems: #Abd pain: in PD patient w/ very high cell count at 53,350. Cx +for GPC on GS and staph epi on culture thus far. Cont IV abx.   # ESRD: cont PD nightly w/ orders as above. PD tonight.    # Volume/ hypertension: Can continue home BP meds. Up 4kg. All 2.5% fluids tonight.    # Anemia of Chronic Kidney Disease: Hemoglobin 8.3. Some dilution possibly. Will obtain iron  studies but hold on administration until infection is ruled out   # Secondary Hyperparathyroidism/Hyperphosphatemia: Cont home calcitriol  and auryxia    # Abdominal Pain: Most likely constipation which is multifactorial including hypokalemia as a cause as well as GLP-1 agonist.  However need to rule out peritonitis - Obtain fluid sample today - Correct hypokalemia as below - hold GLP-1 agonist for now - Will aggressively manage constipation with magnesium citrate x 1 and  MiraLAX    # Hypokalemia: Continue with IV and oral supplementation   [name]  [facility] 09/11/2023, 3:42 PM  Recent Labs  Lab 09/09/23 1835 09/09/23 2248 09/10/23 0548 09/10/23 1433 09/11/23 0500  HGB 9.7*  --  8.3*  --  8.2*  ALBUMIN  3.6  --   --   --   --   CALCIUM 8.8*  --  7.8*  --  7.9*  PHOS  --   --  4.9*  --   --   CREATININE 10.40*  --  10.79*  --  9.82*  K 2.1*   < > 2.5* 3.1* 3.3*   < > = values in this interval not displayed.   Recent Labs  Lab 09/10/23 0548  IRON  24*  TIBC 116*  FERRITIN 1,323*   Inpatient medications:  calcitRIOL   0.5 mcg Oral Q lunch   carvedilol   12.5 mg Oral BID WC   ferric citrate   630 mg Oral TID WC   gentamicin  cream  1 Application Topical Daily   heparin   5,000 Units Subcutaneous Q8H   hydrALAZINE   50 mg Oral Q8H   polyethylene glycol  17 g Oral Daily   sodium chloride  flush  3 mL Intravenous  Q12H   spironolactone   25 mg Oral Daily   traZODone   200 mg Oral QHS   vancomycin  variable dose per unstable renal function (pharmacist dosing)   Does not apply See admin instructions    dialysis solution 1.5% low-MG/low-CA     dialysis solution 2.5% low-MG/low-CA     dialysis solution 4.25% low-MG/low-CA     acetaminophen  **OR** acetaminophen , HYDROmorphone (DILAUDID) injection, oxyCODONE , prochlorperazine, simethicone

## 2023-09-11 NOTE — Plan of Care (Signed)
 Patient AAOx4. Telemetry monitoring in progress. PD dressing changed. PRN pain medication given with + effect. Safety precautions maintained.   Problem: Health Behavior/Discharge Planning: Goal: Ability to manage health-related needs will improve Outcome: Progressing   Problem: Clinical Measurements: Goal: Will remain free from infection Outcome: Progressing   Problem: Activity: Goal: Risk for activity intolerance will decrease Outcome: Progressing   Problem: Nutrition: Goal: Adequate nutrition will be maintained Outcome: Progressing   Problem: Elimination: Goal: Will not experience complications related to bowel motility Outcome: Progressing   Problem: Pain Managment: Goal: General experience of comfort will improve and/or be controlled Outcome: Progressing

## 2023-09-11 NOTE — Progress Notes (Signed)
 PROGRESS NOTE    Adrian Frye  XBJ:[id number] DOB: [birth year] DOA: 09/09/2023 PCP: Benedetta Bradley, MD  Outpatient Specialists:     Brief Narrative:  Patient is a 53 year old male, morbidly obese, past medical history significant for end-stage renal disease on peritoneal dialysis, severe pulmoniv valve regurgitation following endocarditis, diet-controlled diabetes mellitus, insomnia and hypertension.  Patient was admitted with abdominal pain, nausea, vomiting and low-grade fever.  Peritoneal fluid analysis just resulted, with nucleated cell count of 53,530 and 80% neutrophil.  Discussed with nephrology team.  Also consulted infectious disease team, Dr. Arzella Laurence.  Significant hypokalemia was noted on presentation.  Patient has had constipation for a couple of weeks.  09/11/2023: Patient seen.  Abdominal pain persists.  Patient reports diarrhea stools.  Input from infectious disease and nephrology team is appreciated.  Continue management for likely PD catheter associated peritonitis.  Patient was initially on ceftazidime and vancomycin  (IV).  Adjust antibiotics as per culture results.  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 - Follow final cultures. - Infectious disease team and nephrology to advise regarding antibiotics. -Avoid constipation.  Severe hypokalemia: - Admitted with potassium of 2.1. -Potassium is 3.3 today. - Continue to replete potassium. - Repeat magnesium in a.m.  Constipation: - Laxatives. - Avoid constipation as patient is on peritoneal dialysis.  ESRD on peritoneal dialysis: - Nephrology is  managing.  Hypertension: - Continue to optimize.  OSA: - CPAP at nighttime.  Morbid obesity: - Diet and exercise. - Consider Ozempic or Mounjaro  i.e. if not contraindicated.   DVT prophylaxis: Subcutaneous heparin . Code Status: Full code Family Communication: Wife Disposition Plan: Inpatient   Consultants:  Nephrology. Infectious disease  Procedures:  None  Antimicrobials:  Patient was on IV ceftazidime and vancomycin    Subjective: Abdominal pain  Objective: Vitals:   09/11/23 0516 09/11/23 0548 09/11/23 0800 09/11/23 1318  BP: 121/73  133/72 (!) 148/75  Pulse: 70   79  Resp: 18  18 18   Temp: 97.9 F (36.6 C)  98.2 F (36.8 C) 99.3 F (37.4 C)  TempSrc: Oral  Oral Oral  SpO2: 100%  93% 95%  Weight:  125.5 kg    Height:        Intake/Output Summary (Last 24 hours) at 09/11/2023 1627 Last data filed at 09/11/2023 0803 Gross per 24 hour  Intake 1818.47 ml  Output --  Net 1818.47 ml   Filed Weights   09/09/23 1842 09/11/23 0548  Weight: 117 kg 125.5 kg    Examination:  General exam: Appears calm and comfortable.  Patient is morbidly obese. Respiratory system: Clear to auscultation.  Cardiovascular system: S1 & S2 heard. Gastrointestinal system: Abdomen is obese nontender.   Central nervous system: Awake and alert.  Patient moves all extremities.   Extremities:    Data Reviewed: I have personally reviewed following labs and imaging studies  CBC: Recent Labs  Lab 09/09/23 1835 09/10/23 0548 09/11/23 0500  WBC 14.7* 12.3* 8.6  HGB 9.7* 8.3* 8.2*  HCT 28.4* 24.9* 25.0*  MCV 89.9 90.2 90.9  PLT 198 180 191   Basic Metabolic Panel: Recent Labs  Lab 09/09/23 1835 09/09/23 2248 09/10/23  1610 09/10/23 1433 09/11/23 0500  NA 138  --  135  --  133*  K 2.1* 2.4* 2.5* 3.1* 3.3*  CL 94*  --  94*  --  96*  CO2 27  --  26  --  23  GLUCOSE 105*  --  110*  --  123*  BUN 39*  --  42*  --  39*  CREATININE 10.40*  --  10.79*  --  9.82*  CALCIUM  8.8*  --  7.8*  --  7.9*  MG 1.7  --   --   --   --   PHOS  --   --  4.9*  --   --    GFR: Estimated Creatinine Clearance: 11.2 mL/min (A) (by C-G formula based on SCr of 9.82 mg/dL (H)). Liver Function Tests: Recent Labs  Lab 09/09/23 1835  AST 15  ALT 16  ALKPHOS 75  BILITOT 0.4  PROT 5.9*  ALBUMIN  3.6   Recent Labs  Lab 09/09/23 1835  LIPASE 51   No results for input(s): "AMMONIA" in the last 168 hours. Coagulation Profile: No results for input(s): "INR", "PROTIME" in the last 168 hours. Cardiac Enzymes: No results for input(s): "CKTOTAL", "CKMB", "CKMBINDEX", "TROPONINI" in the last 168 hours. BNP (last 3 results) No results for input(s): "PROBNP" in the last 8760 hours. HbA1C: No results for input(s): "HGBA1C" in the last 72 hours. CBG: No results for input(s): "GLUCAP" in the last 168 hours. Lipid Profile: No results for input(s): "CHOL", "HDL", "LDLCALC", "TRIG", "CHOLHDL", "LDLDIRECT" in the last 72 hours. Thyroid  Function Tests: No results for input(s): "TSH", "T4TOTAL", "FREET4", "T3FREE", "THYROIDAB" in the last 72 hours. Anemia Panel: Recent Labs    09/10/23 0548  FERRITIN 1,323*  TIBC 116*  IRON  24*   Urine analysis:    Component Value Date/Time   COLORURINE STRAW (A) 11/02/2017 2208   APPEARANCEUR CLEAR 11/02/2017 2208   LABSPEC 1.011 11/02/2017 2208   PHURINE 5.0 11/02/2017 2208   GLUCOSEU 50 (A) 11/02/2017 2208   HGBUR NEGATIVE 11/02/2017 2208   BILIRUBINUR NEGATIVE 11/02/2017 2208   KETONESUR 5 (A) 11/02/2017 2208   PROTEINUR 100 (A) 11/02/2017 2208   UROBILINOGEN 0.2 09/15/2009 2240   NITRITE NEGATIVE 11/02/2017 2208   LEUKOCYTESUR NEGATIVE 11/02/2017 2208   Sepsis Labs: @LABRCNTIP (procalcitonin:4,lacticidven:4)  ) Recent Results (from the past 240 hours)  Body fluid culture w Gram Stain     Status: None (Preliminary result)   Collection Time: 09/10/23  7:27 AM   Specimen: Peritoneal Washings; Body Fluid  Result Value Ref Range  Status   Specimen Description PERITONEAL  Final   Special Requests NONE  Final   Gram Stain   Final    MODERATE WBC PRESENT, PREDOMINANTLY PMN RARE GRAM POSITIVE COCCI    Culture   Final    FEW STAPHYLOCOCCUS EPIDERMIDIS CULTURE REINCUBATED FOR BETTER GROWTH SUSCEPTIBILITIES TO FOLLOW Performed at [facility], [address] [place], [place] [address]    Report Status PENDING  Incomplete         Radiology Studies: CT ABDOMEN PELVIS W CONTRAST Result Date: 09/09/2023 EXAM: CT ABDOMEN AND PELVIS WITH CONTRAST 09/09/2023 08:19:53 PM TECHNIQUE: CT of the abdomen and pelvis was performed with intravenous contrast. Multiplanar reformatted images are provided for review. Automated exposure control, iterative reconstruction, and/or weight based adjustment of the mA/kV was utilized to reduce the radiation dose to as low as reasonably achievable. CONTRAST: 75mL iohexol (OMNIPAQUE) 300 MG/ML solution 100 mL IOHEXOL 300  MG/ML SOLN COMPARISON: None available. CLINICAL HISTORY: Abdominal pain, acute, nonlocalized; PD patient, worsening abdominal pain for 2 weeks, vomiting. Peritoneal dialysis patient. Last treatment last night but got less fluid off than normal. Abdominal pain starting 1-2 weeks ago. Pain worse today. Increased Monjaro dose 2 weeks ago. Tried Miralax  and lactulose  for constipation. Positive vomiting, negative diarrhea. Denies shortness of breath, chest pain. Dr. Lynder Sanger from body reading room okayed study via verbal phone call. FINDINGS: LOWER CHEST: Mild bibasilar scarring. HEPATOBILIARY: Layering gallbladder sludge without associated inflammatory changes. SPLEEN: No acute abnormality. PANCREAS: No acute abnormality. ADRENAL GLANDS: No acute abnormality. KIDNEYS, URETERS: Bilateral renal atrophy with 2.4 cm simple medial right upper pole renal cyst (image 39), benign (Bosniak 1). Bilateral non-obstructive renal calculi measuring up to 3 mm. No evidence of hydronephrosis. No  evidence of perinephric or periureteral stranding. GI AND BOWEL: Normal appendix (image 57). Stomach demonstrates no acute abnormality. There is no evidence of bowel obstruction. No evidence of appendicitis. PELVIS: Urinary bladder is decompressed. Peritoneal dialysis catheter in the left lower pelvis with associated mild abdominopelvic ascites. PERITONEUM AND RETROPERITONEUM: No evidence of free air. LYMPH NODES: No evidence of lymphadenopathy. VASCULATURE: Atherosclerotic calcifications of the abdominal aorta and branch vessels. BONES AND SOFT TISSUES: No acute osseous abnormality. No focal soft tissue abnormality. LIMITATIONS/ARTIFACTS: Motion artifact. Incidental adrenal and/or renal findings do not require follow up imaging. IMPRESSION: 1. No acute findings. 2. Peritoneal dialysis catheter with mild abdominopelvic ascites. 3. Layering gallbladder sludge without associated inflammatory changes. 4. Bilateral non-obstructive renal calculi measuring up to 3 mm. No hydronephrosis. Electronically signed by: Sriyesh Krishnan MD 09/09/2023 08:32 PM EDT RP Workstation: [id number]        Scheduled Meds:  calcitRIOL   0.5 mcg Oral Q lunch   carvedilol   12.5 mg Oral BID WC   ferric citrate   630 mg Oral TID WC   gentamicin  cream  1 Application Topical Daily   heparin   5,000 Units Subcutaneous Q8H   hydrALAZINE   50 mg Oral Q8H   polyethylene glycol  17 g Oral Daily   sodium chloride  flush  3 mL Intravenous Q12H   spironolactone   25 mg Oral Daily   traZODone   200 mg Oral QHS   vancomycin  variable dose per unstable renal function (pharmacist dosing)   Does not apply See admin instructions   Continuous Infusions:  dialysis solution 2.5% low-MG/low-CA       LOS: 1 day    Time spent: 35 minutes.    Fonnie Iba, MD  [facility] Pager #: [phone] 7PM-7AM contact night coverage as above

## 2023-09-12 DIAGNOSIS — N186 End stage renal disease: Secondary | ICD-10-CM | POA: Diagnosis not present

## 2023-09-12 DIAGNOSIS — Z4932 Encounter for adequacy testing for peritoneal dialysis: Secondary | ICD-10-CM | POA: Diagnosis not present

## 2023-09-12 DIAGNOSIS — N2581 Secondary hyperparathyroidism of renal origin: Secondary | ICD-10-CM | POA: Diagnosis not present

## 2023-09-12 DIAGNOSIS — K659 Peritonitis, unspecified: Secondary | ICD-10-CM | POA: Diagnosis not present

## 2023-09-12 DIAGNOSIS — Z992 Dependence on renal dialysis: Secondary | ICD-10-CM | POA: Diagnosis not present

## 2023-09-12 DIAGNOSIS — D631 Anemia in chronic kidney disease: Secondary | ICD-10-CM | POA: Diagnosis not present

## 2023-09-12 LAB — BASIC METABOLIC PANEL WITH GFR
Anion gap: 12 (ref 5–15)
BUN: 36 mg/dL — ABNORMAL HIGH (ref 6–20)
CO2: 25 mmol/L (ref 22–32)
Calcium: 8 mg/dL — ABNORMAL LOW (ref 8.9–10.3)
Chloride: 94 mmol/L — ABNORMAL LOW (ref 98–111)
Creatinine, Ser: 9.28 mg/dL — ABNORMAL HIGH (ref 0.61–1.24)
GFR, Estimated: 6 mL/min — ABNORMAL LOW (ref >60)
Glucose, Bld: 187 mg/dL — ABNORMAL HIGH (ref 70–99)
Potassium: 2.8 mmol/L — ABNORMAL LOW (ref 3.5–5.1)
Sodium: 131 mmol/L — ABNORMAL LOW (ref 135–145)

## 2023-09-12 LAB — BODY FLUID CULTURE W GRAM STAIN

## 2023-09-12 LAB — CBC
HCT: 28.3 % — ABNORMAL LOW (ref 39.0–52.0)
Hemoglobin: 9.5 g/dL — ABNORMAL LOW (ref 13.0–17.0)
MCH: 30.4 pg (ref 26.0–34.0)
MCHC: 33.6 g/dL (ref 30.0–36.0)
MCV: 90.4 fL (ref 80.0–100.0)
Platelets: 199 10*3/uL (ref 150–400)
RBC: 3.13 MIL/uL — ABNORMAL LOW (ref 4.22–5.81)
RDW: 16.4 % — ABNORMAL HIGH (ref 11.5–15.5)
WBC: 8.5 10*3/uL (ref 4.0–10.5)
nRBC: 0 % (ref 0.0–0.2)

## 2023-09-12 MED ORDER — DELFLEX-LC/1.5% DEXTROSE 344 MOSM/L IP SOLN: INTRAPERITONEAL | Status: DC

## 2023-09-12 MED ORDER — DOCUSATE SODIUM 100 MG PO CAPS
100.0000 mg | ORAL_CAPSULE | Freq: Two times a day (BID) | ORAL | Status: DC
  Administered 2023-09-12 – 2023-09-13 (×4): 100 mg via ORAL
  Filled 2023-09-12 (×4): qty 1

## 2023-09-12 MED ORDER — CEFAZOLIN SODIUM-DEXTROSE 1-4 GM/50ML-% IV SOLN
1.0000 g | Freq: Every day | INTRAVENOUS | Status: DC
  Administered 2023-09-12 – 2023-09-14 (×3): 1 g via INTRAVENOUS
  Filled 2023-09-12 (×3): qty 50

## 2023-09-12 MED ORDER — POTASSIUM CHLORIDE CRYS ER 20 MEQ PO TBCR
40.0000 meq | EXTENDED_RELEASE_TABLET | Freq: Once | ORAL | Status: AC
  Administered 2023-09-12: 40 meq via ORAL
  Filled 2023-09-12: qty 2

## 2023-09-12 MED ORDER — DELFLEX-LC/2.5% DEXTROSE 394 MOSM/L IP SOLN: INTRAPERITONEAL | Status: DC

## 2023-09-12 NOTE — Progress Notes (Signed)
 PD tx initation note:   Pre TX VS: see flowsheet  Pre TX weight: 122.3 kg  PD treatment initiated via aseptic technique. Consent signed and in chart. Patient is alert and oriented. No complaints of pain. No specimen collected. PD exit site clean, dry and intact. Gentamycin and new dressing applied. Bedside RN educated on PD machine and how to contact tech support when PD machine alarms.PD tx initation note:

## 2023-09-12 NOTE — Progress Notes (Signed)
   09/12/23 2015  Peritoneal Catheter Left lower abdomen  No placement date or time found.   Catheter Location: Left lower abdomen  Site Assessment Clean, Dry, Intact  Drainage Description None  Catheter status Accessed  Dressing Gauze/Drain sponge  Dressing Status Clean, Dry, Intact  Dressing Intervention  (Per patient changed this afternoon)  Cycler Setup  Total Number of Night Cycles 6  Night Fill Volume 3000  Dianeal Solution Dextrose  1.5% in 6000 mL Low Cal/Low Mag (1st bag: 1.5%, 2nd bag: 1.5%, 3rd bag: 2.5%)  Night Dwell Time per Cycle - Hour(s) 1  Night Dwell Time per Cycle - Minute(s) 30  Night Time Therapy - Minute(s) 10  Night Time Therapy - Hour(s) 13  Minimum Initial Drain Volume 0  Maximum Peritoneal Volume 4500  Night/Total Therapy Volume 1800  Day Exchange No  Completion  Treatment Status Started  Hand-off documentation  Hand-off Given Given to shift RN/LPN  Report given to (Full Name) Burr Cary LPN

## 2023-09-12 NOTE — Plan of Care (Signed)
 Patient AAOx4. Telemetry monitoring in progress. PD dressing changed. PRN pain medication given with + effect. Refused hydralazine , MD aware. Safety precautions maintained.    Problem: Health Behavior/Discharge Planning: Goal: Ability to manage health-related needs will improve Outcome: Progressing   Problem: Clinical Measurements: Goal: Will remain free from infection Outcome: Progressing   Problem: Activity: Goal: Risk for activity intolerance will decrease Outcome: Progressing   Problem: Nutrition: Goal: Adequate nutrition will be maintained Outcome: Progressing   Problem: Pain Managment: Goal: General experience of comfort will improve and/or be controlled Outcome: Progressing

## 2023-09-12 NOTE — Progress Notes (Signed)
 PROGRESS NOTE  Adrian Frye ZOX:[id number] DOB: 1971-05-05 DOA: 09/09/2023 PCP: Benedetta Bradley, MD   LOS: 2 days   Brief narrative:  Patient is a 53 year old male with past medical history of morbid obesity, end-stage renal disease on peritoneal dialysis, severe pulmonary  valve regurgitation following endocarditis, diet-controlled diabetes mellitus, insomnia and hypertension presented to hospital with abdominal pain nausea vomiting and low-grade fever.  Peritoneal fluid count was positive significant white cells with 80% neutrophils.  Nephrology and ID was consulted and patient was admitted hospital for further evaluation and treatment.    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  ID and nephrology on board.  Continue antibiotics.  Follow-up final culture, preliminary culture with staph epidermidis.  On IV vancomycin . Complains of nausea and less pain today.  Follow-up blood cultures.  WBC at 8.5.   Severe hypokalemia: Initial potassium was 2.1.  Potassium today at 2.8.  Will continue to replace with 40 mill equivalents of potassium today.  Check BMP in AM.  Constipation: Continue MiraLAX  and add Colace.   ESRD on peritoneal dialysis: Nephrology on board for dialysis needs.  Plan for CPPD   Hypertension: Continue Coreg  hydralazine , spironolactone .   OSA: - CPAP at nighttime.   Class III obesity. Body mass index is 43.33 kg/m.  Patient would benefit from ongoing lifestyle modifications and weight loss as outpatient.  DVT prophylaxis: heparin  injection 5,000 Units Start: 09/10/23 0600   Disposition: Home  Status is:  Inpatient Remains inpatient appropriate because: Pending clinical improvement, IV antibiotic, nephro and ID follow-up    Code Status:     Code Status: Full Code  Family Communication: None at bedside  Consultants: Nephrology Infectious disease  Procedures: CPPD  Anti-infectives:  Vancomycin  IV  Anti-infectives (From admission, onward)    Start     Dose/Rate Route Frequency Ordered Stop   09/10/23 2045  vancomycin  (VANCOCIN ) 2,500 mg in sodium chloride  0.9 % 500 mL IVPB        2,500 mg 262.5 mL/hr over 120 Minutes Intravenous  Once 09/10/23 1949 09/11/23 0029   09/10/23 2011  vancomycin  variable dose per unstable renal function (pharmacist dosing)         Does not apply See admin instructions 09/10/23 2013     09/10/23 2000  vancomycin  (VANCOCIN ) 2,500 mg in sodium chloride  0.9 % 500 mL IVPB  Status:  Discontinued        2,500 mg 262.5 mL/hr over 120 Minutes Intravenous  Once 09/10/23 1900 09/10/23 1901   09/10/23 1930  cefTAZidime (FORTAZ) 1 g in sodium chloride  0.9 % 100 mL IVPB  Status:  Discontinued        1 g 200 mL/hr over 30 Minutes Intravenous Every 24 hours 09/10/23 1837 09/11/23 1327   09/10/23 1930  vancomycin  (VANCOCIN ) 2,500 mg in sodium chloride  0.9 % 500 mL IVPB  Status:  Discontinued        2,500 mg 262.5 mL/hr over 120 Minutes Intravenous  Once 09/10/23 1842 09/10/23 1900       Subjective: Today, patient was seen and examined at bedside.  Complains of nausea improved appetite but less abdominal pain.  No shortness of breath or dyspnea.  Objective: Vitals:   09/12/23 0547 09/12/23 0738  BP: 132/62 (!) 152/70  Pulse: 68 72  Resp:  18  Temp:  98.7 F (37.1 C)  SpO2: 98% 97%    Intake/Output Summary (Last 24 hours) at 09/12/2023 0949 Last data filed at 09/11/2023 2305 Gross per 24 hour  Intake 3 ml  Output --  Net 3 ml   Filed Weights   09/09/23 1842 09/11/23 0548  Weight: 117 kg 125.5 kg   Body mass index is 43.33 kg/m.   Physical  Exam:  GENERAL: Patient is alert awake and oriented. Not in obvious distress.  Obese built HENT: No scleral pallor or icterus. Pupils equally reactive to light. Oral mucosa is moist NECK: is supple, no gross swelling noted. CHEST: Clear to auscultation. No crackles or wheezes.  Diminished breath sounds bilaterally. CVS: S1 and S2 heard, no murmur. Regular rate and rhythm.  ABDOMEN: Tenderness over the left lower quadrant, rest of the abdomen soft no rigidity guarding.  Peritoneal dialysis catheter in place. EXTREMITIES: No edema. CNS: Cranial nerves are intact. No focal motor deficits. SKIN: warm and dry without rashes.  Data Review: I have personally reviewed the following laboratory data and studies,  CBC: Recent Labs  Lab 09/09/23 1835 09/10/23 0548 09/11/23 0500 09/12/23 0532  WBC 14.7* 12.3* 8.6 8.5  HGB 9.7* 8.3* 8.2* 9.5*  HCT 28.4* 24.9* 25.0* 28.3*  MCV 89.9 90.2 90.9 90.4  PLT 198 180 191 199   Basic Metabolic Panel: Recent Labs  Lab 09/09/23 1835 09/09/23 2248 09/10/23 0548 09/10/23 1433 09/11/23 0500 09/12/23 0532  NA 138  --  135  --  133* 131*  K 2.1* 2.4* 2.5* 3.1* 3.3* 2.8*  CL 94*  --  94*  --  96* 94*  CO2 27  --  26  --  23 25  GLUCOSE 105*  --  110*  --  123* 187*  BUN 39*  --  42*  --  39* 36*  CREATININE 10.40*  --  10.79*  --  9.82* 9.28*  CALCIUM 8.8*  --  7.8*  --  7.9* 8.0*  MG 1.7  --   --   --   --   --   PHOS  --   --  4.9*  --   --   --    Liver Function Tests: Recent Labs  Lab 09/09/23 1835  AST 15  ALT 16  ALKPHOS 75  BILITOT 0.4  PROT 5.9*  ALBUMIN  3.6   Recent Labs  Lab 09/09/23 1835  LIPASE 51   No results for input(s): "AMMONIA" in the last 168 hours. Cardiac Enzymes: No results for input(s): "CKTOTAL", "CKMB", "CKMBINDEX", "TROPONINI" in the last 168 hours. BNP (last 3 results) No results for input(s): "BNP" in the last 8760 hours.  ProBNP (last 3 results) No results for input(s): "PROBNP" in the last 8760  hours.  CBG: No results for input(s): "GLUCAP" in the last 168 hours. Recent Results (from the past 240 hours)  Body fluid culture w Gram Stain     Status: None (Preliminary result)   Collection Time: 09/10/23  7:27 AM   Specimen: Peritoneal Washings; Body Fluid  Result Value Ref Range Status   Specimen Description PERITONEAL  Final   Special Requests NONE  Final   Gram Stain   Final    MODERATE WBC PRESENT, PREDOMINANTLY PMN RARE GRAM POSITIVE COCCI    Culture   Final    FEW STAPHYLOCOCCUS EPIDERMIDIS CULTURE REINCUBATED  FOR BETTER GROWTH SUSCEPTIBILITIES TO FOLLOW Performed at [facility], [address], [place], [place] [address]    Report Status PENDING  Incomplete     Studies: No results found.    Rosena Conradi, MD  [facility] 09/12/2023  If 7PM-7AM, please contact night-coverage

## 2023-09-12 NOTE — Plan of Care (Signed)

## 2023-09-12 NOTE — Hospital Course (Signed)
 Patient is a 53 year old male with past medical history of morbid obesity, end-stage renal disease on peritoneal dialysis, severe pulmonary  valve regurgitation following endocarditis, diet-controlled diabetes mellitus, insomnia and hypertension presented to hospital with abdominal pain nausea vomiting and low-grade fever.  Peritoneal fluid count was positive significant white cells with 80% neutrophils.  Nephrology and ID was consulted and patient was admitted hospital for further evaluation and treatment..   PD catheter associated peritonitis: - Peritoneal fluid analysis revealed nucleated cell count of 53,000+, and 80% neutrophil.  ID and nephrology on board.  Continue antibiotics.  Follow-up final culture.  On IV vancomycin .   Severe hypokalemia: Initial potassium was 2.1.  Potassium today at 2.8.  Will continue to replace.  Constipation: Continue laxatives.   ESRD on peritoneal dialysis: Nephrology on board for dialysis needs.   Hypertension: Continue Coreg  hydralazine , spironolactone .   OSA: - CPAP at nighttime.   Morbid obesity: Body mass index is 43.33 kg/m.  Patient would benefit from ongoing lifestyle modifications and weight loss as outpatient.

## 2023-09-12 NOTE — Progress Notes (Signed)
 [facility] Progress Note  Subjective:  Seen in room Abd pain continues to improve Vomited last night x 1  Vitals:   09/12/23 0352 09/12/23 0547 09/12/23 0738 09/12/23 1204  BP: 138/72 132/62 (!) 152/70 (!) 152/61  Pulse: 68 68 72 76  Resp: 17  18 18   Temp: 97.6 F (36.4 C)  98.7 F (37.1 C) 98.4 F (36.9 C)  TempSrc: Oral   Oral  SpO2: 100% 98% 97%   Weight:      Height:        Exam:  alert, nad   no jvd  Chest cta bilat  Cor reg no RG  Abd soft, mod pain w/ palpation in mid / L abdomen   Ext no LE edema   Alert, NF, ox3    PD cath mid-abdomen intact  TNC on PD fluid 4/27: 53,350  GS/ culture 4/27 on PD fluid: GS +GPC, culture + staph epi                OP PD: Home Therap[facility] 6 cycles, 3L fill, 1.5hrs, 121kg, last fill 2L amlodipine  10mg , auryxia  3 tabs, calcitriol  0.60mcg, coreg  12.5 bid, spiro 25, xphoza   Assessment/Recommendations: Adrian Frye is a/an 53 y.o. male with a past medical history notable for ESRD on HD admitted with abdominal pain.    Problems: #PD catheter-related peritonitis: initial sig high cell count at 53,350. Cx+ for GPC, and on GS and staph epi on culture thus far. Was started on IV vanc/ zosyn, zosyn dc'd yesterday. Had large IV load of Vanc 2500mg  on 4/27, likely to still be therapeutic per pharm notes. IV Ancef  started today at 1 gm IV daily and IV vanc dc'd. Pharmacy is following.   # ESRD: cont PD nightly w/ orders as above. PD tonight.    # Volume/ hypertension: Can continue home BP meds. Up 4kg but euvolemic on exam. Half 1.5% and half 2.5% fluids tonight.    # Anemia of Chronic Kidney Disease: Hemoglobin 8.3. Some dilution possibly. Will obtain iron  studies but hold on administration until infection is ruled out   # Secondary Hyperparathyroidism/Hyperphosphatemia: Cont home calcitriol  and auryxia    # Hypokalemia: Continue with IV and oral supplementation   [name]  [facility] 09/12/2023, 2:20  PM  Recent Labs  Lab 09/09/23 1835 09/09/23 2248 09/10/23 0548 09/10/23 1433 09/11/23 0500 09/12/23 0532  HGB 9.7*  --  8.3*  --  8.2* 9.5*  ALBUMIN  3.6  --   --   --   --   --   CALCIUM 8.8*  --  7.8*  --  7.9* 8.0*  PHOS  --   --  4.9*  --   --   --   CREATININE 10.40*  --  10.79*  --  9.82* 9.28*  K 2.1*   < > 2.5*   < > 3.3* 2.8*   < > = values in this interval not displayed.   Recent Labs  Lab 09/10/23 0548  IRON  24*  TIBC 116*  FERRITIN 1,323*   Inpatient medications:  calcitRIOL   0.5 mcg Oral Q lunch   carvedilol   12.5 mg Oral BID WC   docusate sodium   100 mg Oral BID   ferric citrate   630 mg Oral TID WC   gentamicin  cream  1 Application Topical Daily   heparin   5,000 Units Subcutaneous Q8H   hydrALAZINE   50 mg Oral Q8H   polyethylene glycol  17 g Oral Daily  sodium chloride  flush  3 mL Intravenous Q12H   spironolactone   25 mg Oral Daily   traZODone   200 mg Oral QHS   vancomycin  variable dose per unstable renal function (pharmacist dosing)   Does not apply See admin instructions     ceFAZolin  (ANCEF ) IV     dialysis solution 2.5% low-MG/low-CA     acetaminophen  **OR** acetaminophen , heparin  sodium (porcine) 3,000 Units in dialysis solution 2.5% low-MG/low-CA 6,000 mL dialysis solution, HYDROmorphone (DILAUDID) injection, oxyCODONE , prochlorperazine, simethicone

## 2023-09-12 NOTE — Progress Notes (Signed)
 Pharmacy Antibiotic Note  Adrian Frye is a 53 y.o. male admitted on 09/09/2023 with MSSE peritonitis.  Pharmacy has been consulted for Cefazolin  dosing.  The patient was loaded with Vancomycin  2500 mg IV x 1 on 4/27 PM - noted to likely still be therapeutic. Beta-lactams are the drug of choice for oxacillin-sensitive, so will go ahead and transition to Cefazolin  today. The patient receives CCPD overnight so will schedule daily dosing in the mornings to be after HD. Keeping IV for now until decision made on PD cath removal.  Plan: - D/c Vancomycin  - Start Cefazolin  1g IV every 24 hours (AM dosing since CCPD overnight) - Will continue to follow PD schedule/duration, culture results, and LOT plans  Height: 5\' 7"  (170.2 cm) Weight: 125.5 kg (276 lb 10.8 oz) IBW/kg (Calculated) : 66.1  Temp (24hrs), Avg:98.3 F (36.8 C), Min:97.6 F (36.4 C), Max:98.7 F (37.1 C)  Recent Labs  Lab 09/09/23 1835 09/10/23 0548 09/11/23 0500 09/12/23 0532  WBC 14.7* 12.3* 8.6 8.5  CREATININE 10.40* 10.79* 9.82* 9.28*  LATICACIDVEN 1.1  --   --   --     Estimated Creatinine Clearance: 11.8 mL/min (A) (by C-G formula based on SCr of 9.28 mg/dL (H)).    Allergies  Allergen Reactions   Ace Inhibitors Anaphylaxis and Swelling   Lisinopril Swelling and Anaphylaxis    Angioedema  Other reaction(s): Angioedema (ALLERGY/intolerance)   Losartan Swelling and Anaphylaxis   Cat Dander Rash    Antimicrobials this admission: Ceftazidime 4/27 x 1 Vancomycin  4/27 x 1 Cefazolin  4/29 >>  Dose adjustments this admission:   Microbiology results: 4/27 PD fluid >> MSSE  Thank you for allowing pharmacy to be a part of this patient's care.  Garland Junk, PharmD, BCPS, BCIDP Infectious Diseases Clinical Pharmacist 09/12/2023 2:15 PM   **Pharmacist phone directory can now be found on [email or web address] ([email or web address]).  Listed under [facility].

## 2023-09-13 DIAGNOSIS — E1122 Type 2 diabetes mellitus with diabetic chronic kidney disease: Secondary | ICD-10-CM | POA: Diagnosis not present

## 2023-09-13 DIAGNOSIS — Z4932 Encounter for adequacy testing for peritoneal dialysis: Secondary | ICD-10-CM | POA: Diagnosis not present

## 2023-09-13 DIAGNOSIS — K659 Peritonitis, unspecified: Secondary | ICD-10-CM | POA: Diagnosis not present

## 2023-09-13 DIAGNOSIS — Z992 Dependence on renal dialysis: Secondary | ICD-10-CM | POA: Diagnosis not present

## 2023-09-13 DIAGNOSIS — N2581 Secondary hyperparathyroidism of renal origin: Secondary | ICD-10-CM | POA: Diagnosis not present

## 2023-09-13 DIAGNOSIS — D631 Anemia in chronic kidney disease: Secondary | ICD-10-CM | POA: Diagnosis not present

## 2023-09-13 DIAGNOSIS — B957 Other staphylococcus as the cause of diseases classified elsewhere: Secondary | ICD-10-CM | POA: Diagnosis not present

## 2023-09-13 DIAGNOSIS — N186 End stage renal disease: Secondary | ICD-10-CM | POA: Diagnosis not present

## 2023-09-13 LAB — CBC WITH DIFFERENTIAL/PLATELET
Abs Immature Granulocytes: 0.34 10*3/uL — ABNORMAL HIGH (ref 0.00–0.07)
Basophils Absolute: 0.1 10*3/uL (ref 0.0–0.1)
Basophils Relative: 1 %
Eosinophils Absolute: 0.1 10*3/uL (ref 0.0–0.5)
Eosinophils Relative: 2 %
HCT: 26.2 % — ABNORMAL LOW (ref 39.0–52.0)
Hemoglobin: 8.8 g/dL — ABNORMAL LOW (ref 13.0–17.0)
Immature Granulocytes: 5 %
Lymphocytes Relative: 15 %
Lymphs Abs: 1.1 10*3/uL (ref 0.7–4.0)
MCH: 30.2 pg (ref 26.0–34.0)
MCHC: 33.6 g/dL (ref 30.0–36.0)
MCV: 90 fL (ref 80.0–100.0)
Monocytes Absolute: 0.8 10*3/uL (ref 0.1–1.0)
Monocytes Relative: 11 %
Neutro Abs: 4.8 10*3/uL (ref 1.7–7.7)
Neutrophils Relative %: 66 %
Platelets: 202 10*3/uL (ref 150–400)
RBC: 2.91 MIL/uL — ABNORMAL LOW (ref 4.22–5.81)
RDW: 16.2 % — ABNORMAL HIGH (ref 11.5–15.5)
WBC: 7.1 10*3/uL (ref 4.0–10.5)
nRBC: 0 % (ref 0.0–0.2)

## 2023-09-13 LAB — BASIC METABOLIC PANEL WITH GFR
Anion gap: 11 (ref 5–15)
BUN: 34 mg/dL — ABNORMAL HIGH (ref 6–20)
CO2: 25 mmol/L (ref 22–32)
Calcium: 7.9 mg/dL — ABNORMAL LOW (ref 8.9–10.3)
Chloride: 95 mmol/L — ABNORMAL LOW (ref 98–111)
Creatinine, Ser: 9.39 mg/dL — ABNORMAL HIGH (ref 0.61–1.24)
GFR, Estimated: 6 mL/min — ABNORMAL LOW (ref >60)
Glucose, Bld: 122 mg/dL — ABNORMAL HIGH (ref 70–99)
Potassium: 3 mmol/L — ABNORMAL LOW (ref 3.5–5.1)
Sodium: 131 mmol/L — ABNORMAL LOW (ref 135–145)

## 2023-09-13 LAB — MAGNESIUM: Magnesium: 2.2 mg/dL (ref 1.7–2.4)

## 2023-09-13 MED ORDER — DELFLEX-LC/1.5% DEXTROSE 344 MOSM/L IP SOLN: INTRAPERITONEAL | Status: DC

## 2023-09-13 MED ORDER — POTASSIUM CHLORIDE CRYS ER 20 MEQ PO TBCR
40.0000 meq | EXTENDED_RELEASE_TABLET | Freq: Once | ORAL | Status: AC
Start: 2023-09-13 — End: 2023-09-13
  Administered 2023-09-13: 40 meq via ORAL
  Filled 2023-09-13: qty 2

## 2023-09-13 NOTE — Procedures (Signed)
PD tx initation note:    PD treatment initiated via aseptic technique. Consent signed and in chart. Patient is alert and oriented. No complaints of pain. No specimen collected. PD exit site clean, dry and intact. Gentamycin and new dressing applied. Bedside RN educated on PD machine and how to contact tech support when PD machine alarms.

## 2023-09-13 NOTE — Progress Notes (Signed)
 Overnight - Borderline hypotensive this morning, have held his Hydral, Coreg , and Spironolactone    Arnulfo Larch, MD  [facility]

## 2023-09-13 NOTE — Plan of Care (Signed)
  Problem: Activity: Goal: Risk for activity intolerance will decrease Outcome: Progressing   Problem: Coping: Goal: Level of anxiety will decrease Outcome: Progressing   Problem: Elimination: Goal: Will not experience complications related to bowel motility Outcome: Progressing Goal: Will not experience complications related to urinary retention Outcome: Progressing   Problem: Pain Managment: Goal: General experience of comfort will improve and/or be controlled Outcome: Progressing   Problem: Safety: Goal: Ability to remain free from injury will improve Outcome: Progressing   Problem: Skin Integrity: Goal: Risk for impaired skin integrity will decrease Outcome: Progressing

## 2023-09-13 NOTE — Plan of Care (Signed)
  Problem: Education: Goal: Knowledge of General Education information will improve Description: Including pain rating scale, medication(s)/side effects and non-pharmacologic comfort measures 09/13/2023 1952 by Baxter Limber D, LPN Outcome: Progressing 09/13/2023 1951 by Baxter Limber D, LPN Outcome: Progressing 09/13/2023 1950 by Baxter Limber D, LPN Outcome: Progressing   Problem: Health Behavior/Discharge Planning: Goal: Ability to manage health-related needs will improve 09/13/2023 1952 by Baxter Limber D, LPN Outcome: Progressing 09/13/2023 1951 by Baxter Limber D, LPN Outcome: Progressing 09/13/2023 1950 by Baxter Limber D, LPN Outcome: Progressing   Problem: Clinical Measurements: Goal: Ability to maintain clinical measurements within normal limits will improve 09/13/2023 1952 by Baxter Limber D, LPN Outcome: Progressing 09/13/2023 1951 by Baxter Limber D, LPN Outcome: Progressing 09/13/2023 1950 by Baxter Limber D, LPN Outcome: Progressing Goal: Will remain free from infection 09/13/2023 1952 by Baxter Limber D, LPN Outcome: Progressing 09/13/2023 1951 by Baxter Limber D, LPN Outcome: Progressing 09/13/2023 1950 by Baxter Limber D, LPN Outcome: Progressing Goal: Diagnostic test results will improve 09/13/2023 1952 by Baxter Limber D, LPN Outcome: Progressing 09/13/2023 1951 by Baxter Limber D, LPN Outcome: Progressing 09/13/2023 1950 by Baxter Limber D, LPN Outcome: Progressing Goal: Respiratory complications will improve 09/13/2023 1952 by Baxter Limber D, LPN Outcome: Progressing 09/13/2023 1951 by Baxter Limber D, LPN Outcome: Progressing 09/13/2023 1950 by Baxter Limber D, LPN Outcome: Progressing Goal: Cardiovascular complication will be avoided 09/13/2023 1952 by Baxter Limber D, LPN Outcome: Progressing 09/13/2023 1951 by Baxter Limber D, LPN Outcome: Progressing 09/13/2023 1950 by Baxter Limber D,  LPN Outcome: Progressing   Problem: Activity: Goal: Risk for activity intolerance will decrease 09/13/2023 1952 by Baxter Limber D, LPN Outcome: Progressing 09/13/2023 1951 by Baxter Limber D, LPN Outcome: Progressing 09/13/2023 1950 by Baxter Limber D, LPN Outcome: Progressing   Problem: Nutrition: Goal: Adequate nutrition will be maintained 09/13/2023 1952 by Baxter Limber D, LPN Outcome: Progressing 09/13/2023 1951 by Baxter Limber D, LPN Outcome: Progressing 09/13/2023 1950 by Baxter Limber D, LPN Outcome: Progressing   Problem: Coping: Goal: Level of anxiety will decrease 09/13/2023 1952 by Baxter Limber D, LPN Outcome: Progressing 09/13/2023 1951 by Baxter Limber D, LPN Outcome: Progressing 09/13/2023 1950 by Baxter Limber D, LPN Outcome: Progressing   Problem: Elimination: Goal: Will not experience complications related to bowel motility 09/13/2023 1952 by Baxter Limber D, LPN Outcome: Progressing 09/13/2023 1951 by Baxter Limber D, LPN Outcome: Progressing 09/13/2023 1950 by Baxter Limber D, LPN Outcome: Progressing Goal: Will not experience complications related to urinary retention 09/13/2023 1952 by Baxter Limber D, LPN Outcome: Progressing 09/13/2023 1951 by Baxter Limber D, LPN Outcome: Progressing 09/13/2023 1950 by Baxter Limber D, LPN Outcome: Progressing   Problem: Pain Managment: Goal: General experience of comfort will improve and/or be controlled 09/13/2023 1952 by Baxter Limber D, LPN Outcome: Progressing 09/13/2023 1951 by Baxter Limber D, LPN Outcome: Progressing 09/13/2023 1950 by Baxter Limber D, LPN Outcome: Progressing   Problem: Safety: Goal: Ability to remain free from injury will improve 09/13/2023 1952 by Baxter Limber D, LPN Outcome: Progressing 09/13/2023 1951 by Baxter Limber D, LPN Outcome: Progressing 09/13/2023 1950 by Baxter Limber D, LPN Outcome: Progressing   Problem: Skin  Integrity: Goal: Risk for impaired skin integrity will decrease 09/13/2023 1952 by Baxter Limber D, LPN Outcome: Progressing 09/13/2023 1951 by Baxter Limber D, LPN Outcome: Progressing 09/13/2023 1950 by Baxter Limber D, LPN Outcome: Progressing

## 2023-09-13 NOTE — Plan of Care (Signed)
  Problem: Education: Goal: Knowledge of General Education information will improve Description: Including pain rating scale, medication(s)/side effects and non-pharmacologic comfort measures 09/13/2023 1951 by Baxter Limber D, LPN Outcome: Progressing 09/13/2023 1950 by Baxter Limber D, LPN Outcome: Progressing   Problem: Health Behavior/Discharge Planning: Goal: Ability to manage health-related needs will improve 09/13/2023 1951 by Baxter Limber D, LPN Outcome: Progressing 09/13/2023 1950 by Baxter Limber D, LPN Outcome: Progressing   Problem: Clinical Measurements: Goal: Ability to maintain clinical measurements within normal limits will improve 09/13/2023 1951 by Baxter Limber D, LPN Outcome: Progressing 09/13/2023 1950 by Baxter Limber D, LPN Outcome: Progressing Goal: Will remain free from infection 09/13/2023 1951 by Baxter Limber D, LPN Outcome: Progressing 09/13/2023 1950 by Baxter Limber D, LPN Outcome: Progressing Goal: Diagnostic test results will improve 09/13/2023 1951 by Baxter Limber D, LPN Outcome: Progressing 09/13/2023 1950 by Baxter Limber D, LPN Outcome: Progressing Goal: Respiratory complications will improve 09/13/2023 1951 by Baxter Limber D, LPN Outcome: Progressing 09/13/2023 1950 by Baxter Limber D, LPN Outcome: Progressing Goal: Cardiovascular complication will be avoided 09/13/2023 1951 by Baxter Limber D, LPN Outcome: Progressing 09/13/2023 1950 by Baxter Limber D, LPN Outcome: Progressing   Problem: Activity: Goal: Risk for activity intolerance will decrease 09/13/2023 1951 by Baxter Limber D, LPN Outcome: Progressing 09/13/2023 1950 by Baxter Limber D, LPN Outcome: Progressing   Problem: Nutrition: Goal: Adequate nutrition will be maintained 09/13/2023 1951 by Baxter Limber D, LPN Outcome: Progressing 09/13/2023 1950 by Baxter Limber D, LPN Outcome: Progressing   Problem: Coping: Goal: Level  of anxiety will decrease 09/13/2023 1951 by Baxter Limber D, LPN Outcome: Progressing 09/13/2023 1950 by Baxter Limber D, LPN Outcome: Progressing   Problem: Elimination: Goal: Will not experience complications related to bowel motility 09/13/2023 1951 by Baxter Limber D, LPN Outcome: Progressing 09/13/2023 1950 by Baxter Limber D, LPN Outcome: Progressing Goal: Will not experience complications related to urinary retention 09/13/2023 1951 by Baxter Limber D, LPN Outcome: Progressing 09/13/2023 1950 by Baxter Limber D, LPN Outcome: Progressing   Problem: Pain Managment: Goal: General experience of comfort will improve and/or be controlled 09/13/2023 1951 by Baxter Limber D, LPN Outcome: Progressing 09/13/2023 1950 by Baxter Limber D, LPN Outcome: Progressing   Problem: Safety: Goal: Ability to remain free from injury will improve 09/13/2023 1951 by Baxter Limber D, LPN Outcome: Progressing 09/13/2023 1950 by Baxter Limber D, LPN Outcome: Progressing   Problem: Skin Integrity: Goal: Risk for impaired skin integrity will decrease 09/13/2023 1951 by Baxter Limber D, LPN Outcome: Progressing 09/13/2023 1950 by Baxter Limber D, LPN Outcome: Progressing

## 2023-09-13 NOTE — Progress Notes (Addendum)
 [facility] Progress Note  Subjective:  Seen in room Abd pain better  Vitals:   09/13/23 0415 09/13/23 0706 09/13/23 0812 09/13/23 1300  BP: 99/67  (!) 147/83   Pulse: 70  69   Resp: 18     Temp: (!) 97.4 F (36.3 C)  98.4 F (36.9 C)   TempSrc: Oral  Oral   SpO2: 99%  98%   Weight:  126.3 kg  122.7 kg  Height:        Exam:  alert, nad   no jvd  Chest cta bilat  Cor reg no RG  Abd soft, mod pain w/ palpation in mid / L abdomen   Ext no LE edema   Alert, NF, ox3    PD cath mid-abdomen intact  TNC on PD fluid 4/27: 53,350  GS/ culture 4/27 on PD fluid: GS +GPC, culture + staph epi                OP PD: [facility] 6 cycles, 3L fill, 1.5hrs, 121kg, last fill 2L amlodipine  10mg , auryxia  3 tabs, calcitriol  0.37mcg, coreg  12.5 bid, spiro 25, xphoza   Assessment/Recommendations: Adrian Frye is a/an 53 y.o. male with a past medical history notable for ESRD on HD admitted with abdominal pain.    Problems: #PD catheter-related peritonitis: initial sig high cell count at 53,350. Cx+ for GPC, and on GS and staph epi on culture thus far. Was started on IV vanc/ zosyn, zosyn dc'd yesterday. Had large IV load of Vanc 2500mg  on 4/27, likely to still be therapeutic per pharm notes. IV Ancef  started 4/29 at 1 gm IV daily and IV vanc dc'd. Need f/u TNC showing improvement prior to discharge - will unfortunately not be able to get this today. Have d/w pt and pmd. Plan is for getting PD fluid sample in the morning tomorrow (coming off PD) and if sig better will be okay for d/c.   # ESRD: cont PD nightly w/ orders as above. PD tonight.    # Volume/ hypertension: Can continue home BP meds. Up 1kg. euvolemic on exam. All 1.5% bags tonight.    # Anemia of Chronic Kidney Disease: Hemoglobin 8.3. Some dilution possibly. Will obtain iron  studies but hold on administration until infection is ruled out   # Secondary Hyperparathyroidism/Hyperphosphatemia: Cont home  calcitriol  and auryxia    # Hypokalemia: Continue with IV and oral supplementation   [name]  [facility] 09/13/2023, 1:42 PM  Recent Labs  Lab 09/09/23 1835 09/09/23 2248 09/10/23 0548 09/10/23 1433 09/12/23 0532 09/13/23 0617  HGB 9.7*  --  8.3*   < > 9.5* 8.8*  ALBUMIN  3.6  --   --   --   --   --   CALCIUM 8.8*  --  7.8*   < > 8.0* 7.9*  PHOS  --   --  4.9*  --   --   --   CREATININE 10.40*  --  10.79*   < > 9.28* 9.39*  K 2.1*   < > 2.5*   < > 2.8* 3.0*   < > = values in this interval not displayed.   Recent Labs  Lab 09/10/23 0548  IRON  24*  TIBC 116*  FERRITIN 1,323*   Inpatient medications:  calcitRIOL   0.5 mcg Oral Q lunch   docusate sodium   100 mg Oral BID   ferric citrate   630 mg Oral TID WC   gentamicin  cream  1 Application Topical Daily  heparin   5,000 Units Subcutaneous Q8H   polyethylene glycol  17 g Oral Daily   sodium chloride  flush  3 mL Intravenous Q12H   traZODone   200 mg Oral QHS     ceFAZolin  (ANCEF ) IV 1 g (09/13/23 1227)   dialysis solution 1.5% low-MG/low-CA     dialysis solution 2.5% low-MG/low-CA     acetaminophen  **OR** acetaminophen , heparin  sodium (porcine) 3,000 Units in dialysis solution 2.5% low-MG/low-CA 6,000 mL dialysis solution, HYDROmorphone (DILAUDID) injection, oxyCODONE , prochlorperazine, simethicone

## 2023-09-13 NOTE — Progress Notes (Signed)
 MD Schertz notified that patient gained .   09/13/23 1027  Peritoneal Catheter Left lower abdomen  No placement date or time found.   Catheter Location: Left lower abdomen  Site Assessment Clean, Dry, Intact  Drainage Description None  Catheter status Deaccessed  Dressing Gauze/Drain sponge  Dressing Status Clean, Dry, Intact  Dressing Intervention  (Assessed)  Completion  Treatment Status Complete  Initial Drain Volume 5  Average Dwell Time-Hour(s) 1  Average Dwell Time-Min(s) 30  Average Drain Time 45  Total Therapy Volume [id number]  Total Therapy Time-Hour(s) 15  Total Therapy Time-Min(s) 11  Weight after Drain  (Weight done in AM)  Effluent Appearance Clear;Amber  Cell Count on Daytime Exchange N/A  Fluid Balance - CCPD  Total UF (- value on cycler, pt gain) -299 mL  Procedure Comments  Tolerated treatment well? No (comment) (Gained 299. Will notify MD)  Hand-off documentation  Hand-off Given Given to shift RN/LPN  Report given to (Full Name) Baxter Limber LPN

## 2023-09-13 NOTE — Plan of Care (Signed)

## 2023-09-13 NOTE — Progress Notes (Signed)
 PROGRESS NOTE  Adrian Frye [id number] DOB: 07-21-1970 DOA: 09/09/2023 PCP: Benedetta Bradley, MD   LOS: 3 days   Brief narrative:  Patient is a 53 year old male with past medical history of morbid obesity, end-stage renal disease on peritoneal dialysis, severe pulmonary  valve regurgitation following endocarditis, diet-controlled diabetes mellitus, insomnia and hypertension presented to hospital with abdominal pain nausea vomiting and low-grade fever.  Peritoneal fluid count was positive significant white cells with 80% neutrophils.  Nephrology and ID was consulted and patient was admitted hospital for further evaluation and treatment.    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  ID and nephrology on board.   Preliminary culture with staph epidermidis.  On IV cefazolin , infectious disease Dr. Levern Reader communicated with me and requested repeating samples for culture and Gram stain.  Will send peritoneal fluid culture and sensitivity.  Less abdominal tenderness today. .  WBC at 7.1.  Temperature max of 98.9 degrees wert.   Severe hypokalemia: Potassium of 3.0 today.  Will replenish with 40 mill equivalents of potassium.   Constipation: Continue MiraLAX  and add Colace.   ESRD on peritoneal dialysis: Nephrology on board for dialysis needs.    Hypertension: Continue Coreg  hydralazine , spironolactone  with holding parameters..  Blood pressure was borderline low this morning.   OSA: - CPAP at nighttime.   Class III obesity. Body mass index is 43.33 kg/m.  Patient would benefit from ongoing lifestyle modifications and weight loss as  outpatient.  DVT prophylaxis: heparin  injection 5,000 Units Start: 09/10/23 0600   Disposition: Home  Status is: Inpatient Remains inpatient appropriate because:  IV antibiotic, nephro and ID follow-up, plan for repeat peritoneal fluid sampling.    Code Status:     Code Status: Full Code  Family Communication: None at bedside  Consultants: Nephrology Infectious disease  Procedures: CPPD  Anti-infectives:  Cefazolin  IV  Anti-infectives (From admission, onward)    Start     Dose/Rate Route Frequency Ordered Stop   09/12/23 1500  ceFAZolin  (ANCEF ) IVPB 1 g/50 mL premix        1 g 100 mL/hr over 30 Minutes Intravenous Daily 09/12/23 1410     09/10/23 2045  vancomycin  (VANCOCIN ) 2,500 mg in sodium chloride  0.9 % 500 mL IVPB        2,500 mg 262.5 mL/hr over 120 Minutes Intravenous  Once 09/10/23 1949 09/11/23 0029   09/10/23 2011  vancomycin  variable dose per unstable renal function (pharmacist dosing)  Status:  Discontinued         Does not apply See admin instructions 09/10/23 2013 09/13/23 0804   09/10/23 2000  vancomycin  (VANCOCIN ) 2,500 mg in sodium chloride  0.9 % 500 mL IVPB  Status:  Discontinued        2,500 mg 262.5 mL/hr over 120 Minutes Intravenous  Once 09/10/23 1900 09/10/23 1901   09/10/23 1930  cefTAZidime (FORTAZ) 1 g in sodium chloride  0.9 % 100 mL IVPB  Status:  Discontinued        1 g 200 mL/hr over 30 Minutes Intravenous Every 24 hours 09/10/23 1837 09/11/23 1327   09/10/23 1930  vancomycin  (VANCOCIN ) 2,500 mg in sodium chloride  0.9 % 500 mL IVPB  Status:  Discontinued        2,500 mg 262.5 mL/hr over 120 Minutes Intravenous  Once 09/10/23 1842 09/10/23 1900       Subjective: Today, patient was seen and examined at bedside.  Patient states that he still does not have appetite but denies any nausea vomiting fever chills or rigor.  Denies any shortness of breath or dyspnea.  Abdominal pain has decreased.    Objective: Vitals:   09/13/23 0415  09/13/23 0812  BP: 99/67 (!) 147/83  Pulse: 70 69  Resp: 18   Temp: (!) 97.4 F (36.3 C) 98.4 F (36.9 C)  SpO2: 99% 98%    Intake/Output Summary (Last 24 hours) at 09/13/2023 1128 Last data filed at 09/12/2023 1637 Gross per 24 hour  Intake 50 ml  Output --  Net 50 ml   Filed Weights   09/09/23 1842 09/11/23 0548 09/13/23 0706  Weight: 117 kg 125.5 kg 126.3 kg   Body mass index is 43.61 kg/m.   Physical Exam:  GENERAL: Patient is alert awake and oriented. Not in obvious distress.  Obese built HENT: No scleral pallor or icterus. Pupils equally reactive to light. Oral mucosa is moist NECK: is supple, no gross swelling noted. CHEST: Clear to auscultation. No crackles or wheezes.  Diminished breath sounds bilaterally. CVS: S1 and S2 heard, no murmur. Regular rate and rhythm.  ABDOMEN: Tenderness over the left lower quadrant significantly improved,.soft no rigidity or guarding.  Peritoneal dialysis catheter in place. EXTREMITIES: No edema. CNS: Cranial nerves are intact. No focal motor deficits. SKIN: warm and dry without rashes.  Data Review: I have personally reviewed the following laboratory data and studies,  CBC: Recent Labs  Lab 09/09/23 1835 09/10/23 0548 09/11/23 0500 09/12/23 0532 09/13/23 0617  WBC 14.7* 12.3* 8.6 8.5 7.1  NEUTROABS  --   --   --   --  4.8  HGB 9.7* 8.3* 8.2* 9.5* 8.8*  HCT 28.4* 24.9* 25.0* 28.3* 26.2*  MCV 89.9 90.2 90.9 90.4 90.0  PLT 198 180 191 199 202   Basic Metabolic Panel: Recent Labs  Lab 09/09/23 1835 09/09/23 2248 09/10/23 0548 09/10/23 1433 09/11/23 0500 09/12/23 0532 09/13/23 0617  NA 138  --  135  --  133* 131* 131*  K 2.1*   < > 2.5* 3.1* 3.3* 2.8* 3.0*  CL 94*  --  94*  --  96* 94* 95*  CO2 27  --  26  --  23 25 25   GLUCOSE 105*  --  110*  --  123* 187* 122*  BUN 39*  --  42*  --  39* 36* 34*  CREATININE 10.40*  --  10.79*  --  9.82* 9.28* 9.39*  CALCIUM 8.8*  --  7.8*  --  7.9* 8.0* 7.9*  MG 1.7  --   --    --   --   --  2.2  PHOS  --   --  4.9*  --   --   --   --    < > = values in this interval not displayed.   Liver Function Tests: Recent Labs  Lab 09/09/23 1835  AST 15  ALT 16  ALKPHOS 75  BILITOT 0.4  PROT 5.9*  ALBUMIN  3.6   Recent Labs  Lab 09/09/23 1835  LIPASE 51   No results for input(s): "AMMONIA" in the last 168 hours. Cardiac Enzymes: No results for input(s): "CKTOTAL", "CKMB", "CKMBINDEX", "TROPONINI" in the last 168 hours. BNP (last  3 results) No results for input(s): "BNP" in the last 8760 hours.  ProBNP (last 3 results) No results for input(s): "PROBNP" in the last 8760 hours.  CBG: No results for input(s): "GLUCAP" in the last 168 hours. Recent Results (from the past 240 hours)  Body fluid culture w Gram Stain     Status: None   Collection Time: 09/10/23  7:27 AM   Specimen: Peritoneal Washings; Body Fluid  Result Value Ref Range Status   Specimen Description PERITONEAL  Final   Special Requests NONE  Final   Gram Stain   Final    MODERATE WBC PRESENT, PREDOMINANTLY PMN RARE GRAM POSITIVE COCCI Performed at [facility], [address] [place], [place] [address]    Culture FEW STAPHYLOCOCCUS EPIDERMIDIS  Final   Report Status 09/12/2023 FINAL  Final   Organism ID, Bacteria STAPHYLOCOCCUS EPIDERMIDIS  Final      Susceptibility   Staphylococcus epidermidis - MIC*    CIPROFLOXACIN <=0.5 SENSITIVE Sensitive     ERYTHROMYCIN >=8 RESISTANT Resistant     GENTAMICIN  <=0.5 SENSITIVE Sensitive     OXACILLIN <=0.25 SENSITIVE Sensitive     TETRACYCLINE 2 SENSITIVE Sensitive     VANCOMYCIN  <=0.5 SENSITIVE Sensitive     TRIMETH/SULFA <=10 SENSITIVE Sensitive     CLINDAMYCIN <=0.25 SENSITIVE Sensitive     RIFAMPIN <=0.5 SENSITIVE Sensitive     Inducible Clindamycin NEGATIVE Sensitive     * FEW STAPHYLOCOCCUS EPIDERMIDIS     Studies: No results found.    Bettylou Frew, MD  [facility] 09/13/2023  If 7PM-7AM, please contact  night-coverage

## 2023-09-13 NOTE — Care Management Important Message (Signed)
 Important Message  Patient Details  Name: Adrian Frye MRN: [id number] Date of Birth: 1971-02-11   Important Message Given:  Yes - Medicare IM     [name] 09/13/2023, 3:06 PM

## 2023-09-13 NOTE — Progress Notes (Signed)
 [facility]  Date of Admission:  09/09/2023     Reason for Follow Up: Peritonitis [facility])  Total days of antibiotics 3         ASSESSMENT:  Adrian Frye has completed 3 days of antimicrobial therapy in the setting of peritonitis with cultures showing Staphylococcus epidermidis.  Discussed plan of care to continue current dose of cefazolin  and will need approximately 2 to 3 weeks of treatment following clearance of peritoneal fluid.  Plan for repeat peritoneal fluid sampling per nephrology/internal medicine.  Continue standard/universal precautions.  Peritoneal dialysis per nephrology.  Remaining medical and supportive care per internal medicine.  PLAN:  Continue current dose of cefazolin . Peritoneal fluid sampling to ensure clearance of infection. Peritoneal dialysis per nephrology. Standard/universal precautions. Remaining medical and supportive care per internal medicine.  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  Tolerating antibiotics with no adverse side effects.  Feeling well  Allergies  Allergen Reactions   Ace Inhibitors Anaphylaxis and Swelling   Lisinopril Swelling and Anaphylaxis    Angioedema  Other reaction(s): Angioedema (ALLERGY/intolerance)   Losartan Swelling and Anaphylaxis   Cat Dander Rash     Review of Systems: Review of Systems  Constitutional:  Negative for  chills, fever and weight loss.  Respiratory:  Negative for cough, shortness of breath and wheezing.   Cardiovascular:  Negative for chest pain and leg swelling.  Gastrointestinal:  Negative for abdominal pain, constipation, diarrhea, nausea and vomiting.  Skin:  Negative for rash.      OBJECTIVE: Vitals:   09/13/23 0413 09/13/23 0415 09/13/23 0706 09/13/23 0812  BP: 99/67 99/67  (!) 147/83  Pulse: 72 70  69  Resp: 18 18    Temp: (!) 97.4 F (36.3 C) (!) 97.4 F (36.3 C)  98.4 F (36.9 C)  TempSrc: Oral Oral  Oral  SpO2:  99%  98%  Weight:   126.3 kg   Height:       Body mass index is 43.61 kg/m.  Physical Exam Constitutional:      General: He is not in acute distress.    Appearance: He is well-developed.  Cardiovascular:     Rate and Rhythm: Normal rate and regular rhythm.     Heart sounds: Normal heart sounds.  Pulmonary:     Effort: Pulmonary effort is normal.     Breath sounds: Normal breath sounds.  Skin:    General: Skin is warm and dry.  Neurological:     Mental Status: He is alert.  Psychiatric:        Mood and Affect: Mood normal.     Lab Results Lab Results  Component Value Date   WBC 7.1 09/13/2023   HGB 8.8 (L) 09/13/2023   HCT 26.2 (L) 09/13/2023   MCV  90.0 09/13/2023   PLT 202 09/13/2023    Lab Results  Component Value Date   CREATININE 9.39 (H) 09/13/2023   BUN 34 (H) 09/13/2023   NA 131 (L) 09/13/2023   K 3.0 (L) 09/13/2023   CL 95 (L) 09/13/2023   CO2 25 09/13/2023    Lab Results  Component Value Date   ALT 16 09/09/2023   AST 15 09/09/2023   ALKPHOS 75 09/09/2023   BILITOT 0.4 09/09/2023     Microbiology: Recent Results (from the past 240 hours)  Body fluid culture w Gram Stain     Status: None   Collection Time: 09/10/23  7:27 AM   Specimen: Peritoneal Washings; Body Fluid  Result Value Ref Range Status   Specimen Description PERITONEAL  Final   Special Requests NONE  Final   Gram Stain   Final    MODERATE WBC PRESENT,  PREDOMINANTLY PMN RARE GRAM POSITIVE COCCI Performed at [facility], [address], [place], [place] [address]    Culture FEW STAPHYLOCOCCUS EPIDERMIDIS  Final   Report Status 09/12/2023 FINAL  Final   Organism ID, Bacteria STAPHYLOCOCCUS EPIDERMIDIS  Final      Susceptibility   Staphylococcus epidermidis - MIC*    CIPROFLOXACIN <=0.5 SENSITIVE Sensitive     ERYTHROMYCIN >=8 RESISTANT Resistant     GENTAMICIN  <=0.5 SENSITIVE Sensitive     OXACILLIN <=0.25 SENSITIVE Sensitive     TETRACYCLINE 2 SENSITIVE Sensitive     VANCOMYCIN  <=0.5 SENSITIVE Sensitive     TRIMETH/SULFA <=10 SENSITIVE Sensitive     CLINDAMYCIN <=0.25 SENSITIVE Sensitive     RIFAMPIN <=0.5 SENSITIVE Sensitive     Inducible Clindamycin NEGATIVE Sensitive     * FEW STAPHYLOCOCCUS EPIDERMIDIS    I have personally spent 28 minutes involved in face-to-face and non-face-to-face activities for this patient on the day of the visit. Professional time spent includes the following activities: Preparing to see the patient (review of tests), Obtaining and/or reviewing separately obtained history (admission/discharge record), Performing a medically appropriate examination and/or evaluation , Ordering medications/tests/procedures, referring and communicating with other health care professionals, Documenting clinical information in the EMR, Independently interpreting results (not separately reported), Communicating results to the patient/family/caregiver, Counseling and educating the patient/family/caregiver and Care coordination (not separately reported).    Greg Geetika Laborde, NP [facility]  09/13/2023  12:43 PM

## 2023-09-14 ENCOUNTER — Other Ambulatory Visit (HOSPITAL_BASED_OUTPATIENT_CLINIC_OR_DEPARTMENT_OTHER): Payer: Self-pay

## 2023-09-14 DIAGNOSIS — Z992 Dependence on renal dialysis: Secondary | ICD-10-CM | POA: Diagnosis not present

## 2023-09-14 DIAGNOSIS — K659 Peritonitis, unspecified: Secondary | ICD-10-CM | POA: Diagnosis not present

## 2023-09-14 DIAGNOSIS — Z4932 Encounter for adequacy testing for peritoneal dialysis: Secondary | ICD-10-CM | POA: Diagnosis not present

## 2023-09-14 DIAGNOSIS — N186 End stage renal disease: Secondary | ICD-10-CM | POA: Diagnosis not present

## 2023-09-14 DIAGNOSIS — R1033 Periumbilical pain: Secondary | ICD-10-CM | POA: Diagnosis not present

## 2023-09-14 DIAGNOSIS — N2581 Secondary hyperparathyroidism of renal origin: Secondary | ICD-10-CM | POA: Diagnosis not present

## 2023-09-14 DIAGNOSIS — D631 Anemia in chronic kidney disease: Secondary | ICD-10-CM | POA: Diagnosis not present

## 2023-09-14 LAB — CBC
HCT: 26.7 % — ABNORMAL LOW (ref 39.0–52.0)
Hemoglobin: 8.9 g/dL — ABNORMAL LOW (ref 13.0–17.0)
MCH: 30.2 pg (ref 26.0–34.0)
MCHC: 33.3 g/dL (ref 30.0–36.0)
MCV: 90.5 fL (ref 80.0–100.0)
Platelets: 209 10*3/uL (ref 150–400)
RBC: 2.95 MIL/uL — ABNORMAL LOW (ref 4.22–5.81)
RDW: 16.2 % — ABNORMAL HIGH (ref 11.5–15.5)
WBC: 8 10*3/uL (ref 4.0–10.5)
nRBC: 0 % (ref 0.0–0.2)

## 2023-09-14 LAB — BASIC METABOLIC PANEL WITH GFR
Anion gap: 14 (ref 5–15)
BUN: 30 mg/dL — ABNORMAL HIGH (ref 6–20)
CO2: 24 mmol/L (ref 22–32)
Calcium: 8.1 mg/dL — ABNORMAL LOW (ref 8.9–10.3)
Chloride: 93 mmol/L — ABNORMAL LOW (ref 98–111)
Creatinine, Ser: 9.06 mg/dL — ABNORMAL HIGH (ref 0.61–1.24)
GFR, Estimated: 6 mL/min — ABNORMAL LOW (ref >60)
Glucose, Bld: 110 mg/dL — ABNORMAL HIGH (ref 70–99)
Potassium: 2.9 mmol/L — ABNORMAL LOW (ref 3.5–5.1)
Sodium: 131 mmol/L — ABNORMAL LOW (ref 135–145)

## 2023-09-14 LAB — BODY FLUID CELL COUNT WITH DIFFERENTIAL
Eos, Fluid: 15 %
Lymphs, Fluid: 5 %
Monocyte-Macrophage-Serous Fluid: 9 % — ABNORMAL LOW (ref 50–90)
Neutrophil Count, Fluid: 71 % — ABNORMAL HIGH (ref 0–25)
Total Nucleated Cell Count, Fluid: 84 uL (ref 0–1000)

## 2023-09-14 LAB — PATHOLOGIST SMEAR REVIEW

## 2023-09-14 LAB — MAGNESIUM: Magnesium: 2 mg/dL (ref 1.7–2.4)

## 2023-09-14 MED ORDER — CEFAZOLIN SODIUM-DEXTROSE 1-4 GM/50ML-% IV SOLN: 1.0000 g | Freq: Every day | INTRAVENOUS | Status: AC

## 2023-09-14 MED ORDER — CHLORHEXIDINE GLUCONATE CLOTH 2 % EX PADS: 6.0000 | MEDICATED_PAD | Freq: Every day | CUTANEOUS | Status: DC

## 2023-09-14 MED ORDER — POTASSIUM CHLORIDE CRYS ER 10 MEQ PO TBCR
30.0000 meq | EXTENDED_RELEASE_TABLET | Freq: Every day | ORAL | 0 refills | Status: AC
  Filled 2023-09-14: qty 7, 2d supply, fill #0

## 2023-09-14 MED ORDER — POTASSIUM CHLORIDE CRYS ER 20 MEQ PO TBCR
20.0000 meq | EXTENDED_RELEASE_TABLET | Freq: Once | ORAL | Status: AC
  Administered 2023-09-14: 20 meq via ORAL
  Filled 2023-09-14: qty 1

## 2023-09-14 MED ORDER — POTASSIUM CHLORIDE CRYS ER 20 MEQ PO TBCR: 40.0000 meq | EXTENDED_RELEASE_TABLET | Freq: Once | ORAL | Status: DC

## 2023-09-14 NOTE — Progress Notes (Signed)
 [facility]  Date of Admission:  09/09/2023     Reason for Follow Up: Peritonitis [facility])  Total days of antibiotics 5         ASSESSMENT:  Mr. Ege repeat peritoneal fluid specimen significantly improved with total nucleated cells down to 84 and neutrophils down to 71% and appearance being clear in the setting of Staphylococcus epidermis peritonitis.  Given significant improvements okay to keep catheter and will plan for treatment with cefazolin  for 3 weeks to complete treatment.  End of therapy will be set for 10/03/2023.  Plan for cefazolin  administration with peritoneal dialysis.  Once arranged with neurology okay for discharge from ID standpoint.  Will arrange follow-up in ID clinic.  Continue standard/universal precautions.  Remaining medical and supportive care per internal medicine.  PLAN:  Continue current dose of cefazolin  through 10/03/2023 with peritoneal dialysis. End of treatment 10/03/2023. Follow-up in ID office following treatment. Peritoneal dialysis and antibiotic administration per nephrology. Standard/universal precautions. Okay for discharge from ID standpoint. Remaining medical and supportive care per internal medicine.  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  Tolerating antibiotics with no adverse side effects.  Mild abdominal  pain.   Allergies  Allergen Reactions   Ace Inhibitors Anaphylaxis and Swelling   Lisinopril Swelling and Anaphylaxis    Angioedema  Other reaction(s): Angioedema (ALLERGY/intolerance)   Losartan Swelling and Anaphylaxis   Cat Dander Rash     Review of Systems: Review of Systems  Constitutional:  Negative for chills, fever and weight loss.  Respiratory:  Negative for cough, shortness of breath and wheezing.   Cardiovascular:  Negative for chest pain and leg swelling.  Gastrointestinal:  Negative for abdominal pain, constipation, diarrhea, nausea and vomiting.  Skin:  Negative for rash.      OBJECTIVE: Vitals:   09/13/23 2029 09/14/23 0456 09/14/23 0500 09/14/23 0815  BP: (!) 158/80 104/64  133/78  Pulse: 86 71  73  Resp: 17 17  18   Temp: 98.3 F (36.8 C) 98.4 F (36.9 C)  98.2 F (36.8 C)  TempSrc: Oral Oral  Oral  SpO2: 96% 98%  94%  Weight:   123.5 kg   Height:       Body mass index is 42.63 kg/m.  Physical Exam Constitutional:      General: He is not in acute distress.    Appearance: He is well-developed.  Cardiovascular:     Rate and Rhythm: Normal rate and regular rhythm.     Heart sounds: Normal heart sounds.  Pulmonary:     Effort: Pulmonary effort is normal.     Breath sounds: Normal breath sounds.  Skin:    General: Skin is warm and dry.  Neurological:  Mental Status: He is alert and oriented to person, place, and time.  Psychiatric:        Behavior: Behavior normal.        Thought Content: Thought content normal.        Judgment: Judgment normal.     Lab Results Lab Results  Component Value Date   WBC 8.0 09/14/2023   HGB 8.9 (L) 09/14/2023   HCT 26.7 (L) 09/14/2023   MCV 90.5 09/14/2023   PLT 209 09/14/2023    Lab Results  Component Value Date   CREATININE 9.06 (H) 09/14/2023   BUN 30 (H) 09/14/2023   NA 131 (L) 09/14/2023   K 2.9 (L) 09/14/2023   CL 93 (L) 09/14/2023   CO2 24 09/14/2023    Lab Results  Component Value  Date   ALT 16 09/09/2023   AST 15 09/09/2023   ALKPHOS 75 09/09/2023   BILITOT 0.4 09/09/2023     Microbiology: Recent Results (from the past 240 hours)  Body fluid culture w Gram Stain     Status: None   Collection Time: 09/10/23  7:27 AM   Specimen: Peritoneal Washings; Body Fluid  Result Value Ref Range Status   Specimen Description PERITONEAL  Final   Special Requests NONE  Final   Gram Stain   Final    MODERATE WBC PRESENT, PREDOMINANTLY PMN RARE GRAM POSITIVE COCCI Performed at [facility], [address], [place], [place] [address]    Culture FEW STAPHYLOCOCCUS EPIDERMIDIS  Final   Report Status 09/12/2023 FINAL  Final   Organism ID, Bacteria STAPHYLOCOCCUS EPIDERMIDIS  Final      Susceptibility   Staphylococcus epidermidis - MIC*    CIPROFLOXACIN <=0.5 SENSITIVE Sensitive     ERYTHROMYCIN >=8 RESISTANT Resistant     GENTAMICIN  <=0.5 SENSITIVE Sensitive     OXACILLIN <=0.25 SENSITIVE Sensitive     TETRACYCLINE 2 SENSITIVE Sensitive     VANCOMYCIN  <=0.5 SENSITIVE Sensitive     TRIMETH/SULFA <=10 SENSITIVE Sensitive     CLINDAMYCIN <=0.25 SENSITIVE Sensitive     RIFAMPIN <=0.5 SENSITIVE Sensitive     Inducible Clindamycin NEGATIVE Sensitive     * FEW STAPHYLOCOCCUS EPIDERMIDIS     Greg Rosmary Dionisio, NP [facility]  09/14/2023  10:09 AM

## 2023-09-14 NOTE — Progress Notes (Signed)
   09/14/23 0657  Peritoneal Catheter Left lower abdomen  No placement date or time found.   Catheter Location: Left lower abdomen  Site Assessment Clean, Dry, Intact  Drainage Description None  Catheter status Deaccessed  Dressing Gauze/Drain sponge  Completion  Treatment Status Complete  Initial Drain Volume 22  Average Dwell Time-Hour(s) 1  Average Dwell Time-Min(s) 30  Average Drain Time 43  Total Therapy Volume 16109  Total Therapy Time-Hour(s) 12  Total Therapy Time-Min(s) 35  Effluent Appearance Amber;Clear  Cell Count on Daytime Exchange Sent  Fluid Balance - CCPD  Total UF (- value on cycler, pt gain) -379 mL  Procedure Comments  Tolerated treatment well? Yes   Peritoneal fluid sent to labs.

## 2023-09-14 NOTE — Discharge Summary (Signed)
 Physician Discharge Summary  Adrian Frye ZOX:[id number] DOB: [birth year] DOA: 09/09/2023  PCP: Benedetta Bradley, MD  Admit date: 09/09/2023 Discharge date: 09/14/2023  Admitted From: Home  Discharge disposition: Home   Recommendations for Outpatient Follow-Up:   Follow up with your primary care provider in one week.  Check CBC, BMP, magnesium  in the next visit Patient has been prescribed IV Ancef  for 3 weeks for peritonitis.   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  Diet recommendation: Low sodium, heart healthy.    Wound care: None.  Code status: Full.   History of Present Illness:   Patient is a 53 year old male with past medical history of morbid obesity, end-stage renal disease on peritoneal dialysis, severe pulmonary valve regurgitation following endocarditis, diet-controlled diabetes mellitus, insomnia and hypertension presented to hospital with abdominal pain nausea vomiting and low-grade fever. Peritoneal fluid count was positive significant white cells with 80% neutrophils. Nephrology and ID was consulted and patient was admitted hospital for further evaluation and treatment.    Hospital Course:   Following conditions were addressed during hospitalization as listed below,  PD catheter associated peritonitis:  Peritoneal fluid analysis revealed nucleated cell count of 53,000+, and 80% neutrophil.  ID and nephrology on board.   Preliminary culture with staph epidermidis.  On IV cefazolin , infectious disease recommends 3 weeks of IV cefazolin  on discharge.  Peritoneal fluid cell count today at 71 and has significantly improved.  Patient clinically has improved as well and is afebrile.  Communicated  with nephrology and Infectious disease prior to discharge.  Stable for disposition home with outpatient antibiotic.    Severe hypokalemia: Potassium of 2.9 today.  Received 60 mill equivalents of potassium today.  Will continue 30 mEq of potassium daily for next 7 days.  Spoke with nephrology about it.  Patient will get BMP as outpatient which has already been scheduled.      ESRD on peritoneal dialysis: Nephrology on board for dialysis needs.  Will continue peritoneal dialysis on discharge.   Hypertension: Continue Coreg  hydralazine , spironolactone  from home.    OSA: - CPAP at nighttime.   Class III obesity. Body mass index is 43.33 kg/m.  Patient would benefit from ongoing lifestyle modifications and weight loss as outpatient.    Disposition.  At this time, patient is stable for disposition home with outpatient PCP and nephrology follow-up.  Medical Consultants:   Nephrology Infectious disease  Procedures:    Peritoneal dialysis Subjective:   Today, patient seen and examined at bedside.  Denies any nausea vomiting abdominal pain.  No fever chills or rigor.  Wants to go home.  Denies any shortness of breath or dyspnea.  Discharge Exam:   Vitals:   09/14/23 0456 09/14/23 0815  BP: 104/64 133/78  Pulse: 71 73  Resp: 17 18  Temp: 98.4 F (36.9 C) 98.2 F (36.8 C)  SpO2: 98% 94%   Vitals:   09/13/23 2029 09/14/23 0456 09/14/23 0500 09/14/23 0815  BP: (!) 158/80 104/64  133/78  Pulse: 86 71  73  Resp: 17 17  18   Temp: 98.3 F (36.8 C) 98.4 F (36.9 C)  98.2 F (36.8 C)  TempSrc: Oral Oral  Oral  SpO2: 96% 98%  94%  Weight:  123.5 kg   Height:       Body mass index is 42.63 kg/m.   General: Alert awake, not in obvious distress, obese built HENT: pupils equally reacting to light,  No scleral pallor or icterus noted. Oral mucosa is moist.  Chest:  Clear breath sounds.  No crackles or wheezes.  CVS: S1 &S2 heard. No murmur.  Regular rate and  rhythm. Abdomen: Soft, nontender, nondistended.  Bowel sounds are heard.  Peritoneal dialysis catheter in place. Extremities: No cyanosis, clubbing or edema.  Peripheral pulses are palpable. Psych: Alert, awake and oriented, normal mood CNS:  No cranial nerve deficits.  Power equal in all extremities.   Skin: Warm and dry.  No rashes noted.  The results of significant diagnostics from this hospitalization (including imaging, microbiology, ancillary and laboratory) are listed below for reference.     Diagnostic Studies:   CT ABDOMEN PELVIS W CONTRAST Result Date: 09/09/2023 EXAM: CT ABDOMEN AND PELVIS WITH CONTRAST 09/09/2023 08:19:53 PM TECHNIQUE: CT of the abdomen and pelvis was performed with intravenous contrast. Multiplanar reformatted images are provided for review. Automated exposure control, iterative reconstruction, and/or weight based adjustment of the mA/kV was utilized to reduce the radiation dose to as low as reasonably achievable. CONTRAST: 75mL iohexol  (OMNIPAQUE ) 300 MG/ML solution 100 mL IOHEXOL  300 MG/ML SOLN COMPARISON: None available. CLINICAL HISTORY: Abdominal pain, acute, nonlocalized; PD patient, worsening abdominal pain for 2 weeks, vomiting. Peritoneal dialysis patient. Last treatment last night but got less fluid off than normal. Abdominal pain starting 1-2 weeks ago. Pain worse today. Increased Monjaro dose 2 weeks ago. Tried Miralax  and lactulose  for constipation. Positive vomiting, negative diarrhea. Denies shortness of breath, chest pain. Dr. Lynder Sanger from body reading room okayed study via verbal phone call. FINDINGS: LOWER CHEST: Mild bibasilar scarring. HEPATOBILIARY: Layering gallbladder sludge without associated inflammatory changes. SPLEEN: No acute abnormality. PANCREAS: No acute abnormality. ADRENAL GLANDS: No acute abnormality. KIDNEYS, URETERS: Bilateral renal atrophy with 2.4 cm simple medial right upper pole renal cyst (image 39), benign (Bosniak 1). Bilateral  non-obstructive renal calculi measuring up to 3 mm. No evidence of hydronephrosis. No evidence of perinephric or periureteral stranding. GI AND BOWEL: Normal appendix (image 57). Stomach demonstrates no acute abnormality. There is no evidence of bowel obstruction. No evidence of appendicitis. PELVIS: Urinary bladder is decompressed. Peritoneal dialysis catheter in the left lower pelvis with associated mild abdominopelvic ascites. PERITONEUM AND RETROPERITONEUM: No evidence of free air. LYMPH NODES: No evidence of lymphadenopathy. VASCULATURE: Atherosclerotic calcifications of the abdominal aorta and branch vessels. BONES AND SOFT TISSUES: No acute osseous abnormality. No focal soft tissue abnormality. LIMITATIONS/ARTIFACTS: Motion artifact. Incidental adrenal and/or renal findings do not require follow up imaging. IMPRESSION: 1. No acute findings. 2. Peritoneal dialysis catheter with mild abdominopelvic ascites. 3. Layering gallbladder sludge without associated inflammatory changes. 4. Bilateral non-obstructive renal calculi measuring up to 3 mm. No hydronephrosis. Electronically signed by: Zadie Herter MD 09/09/2023 08:32 PM EDT RP Workstation: [id number]     Labs:   Basic Metabolic Panel: Recent Labs  Lab 09/09/23 1835 09/09/23 2248 09/10/23 0548 09/10/23 1433 09/11/23 0500 09/12/23 0532 09/13/23 0617 09/14/23 0701  NA 138  --  135  --  133* 131* 131* 131*  K 2.1*   < > 2.5*   < > 3.3* 2.8* 3.0* 2.9*  CL 94*  --  94*  --  96* 94* 95* 93*  CO2 27  --  26  --  23 25 25 24   GLUCOSE 105*  --  110*  --  123* 187* 122* 110*  BUN 39*  --  42*  --  39* 36* 34* 30*  CREATININE 10.40*  --  10.79*  --  9.82* 9.28* 9.39* 9.06*  CALCIUM 8.8*  --  7.8*  --  7.9* 8.0* 7.9* 8.1*  MG 1.7  --   --   --   --   --  2.2 2.0  PHOS  --   --  4.9*  --   --   --   --   --    < > = values in this interval not displayed.   GFR Estimated Creatinine Clearance: 12 mL/min (A) (by C-G formula based on SCr of  9.06 mg/dL (H)). Liver Function Tests: Recent Labs  Lab 09/09/23 1835  AST 15  ALT 16  ALKPHOS 75  BILITOT 0.4  PROT 5.9*  ALBUMIN  3.6   Recent Labs  Lab 09/09/23 1835  LIPASE 51   No results for input(s): "AMMONIA" in the last 168 hours. Coagulation profile No results for input(s): "INR", "PROTIME" in the last 168 hours.  CBC: Recent Labs  Lab 09/10/23 0548 09/11/23 0500 09/12/23 0532 09/13/23 0617 09/14/23 0701  WBC 12.3* 8.6 8.5 7.1 8.0  NEUTROABS  --   --   --  4.8  --   HGB 8.3* 8.2* 9.5* 8.8* 8.9*  HCT 24.9* 25.0* 28.3* 26.2* 26.7*  MCV 90.2 90.9 90.4 90.0 90.5  PLT 180 191 199 202 209   Cardiac Enzymes: No results for input(s): "CKTOTAL", "CKMB", "CKMBINDEX", "TROPONINI" in the last 168 hours. BNP: Invalid input(s): "POCBNP" CBG: No results for input(s): "GLUCAP" in the last 168 hours. D-Dimer No results for input(s): "DDIMER" in the last 72 hours. Hgb A1c No results for input(s): "HGBA1C" in the last 72 hours. Lipid Profile No results for input(s): "CHOL", "HDL", "LDLCALC", "TRIG", "CHOLHDL", "LDLDIRECT" in the last 72 hours. Thyroid  function studies No results for input(s): "TSH", "T4TOTAL", "T3FREE", "THYROIDAB" in the last 72 hours.  Invalid input(s): "FREET3" Anemia work up No results for input(s): "VITAMINB12", "FOLATE", "FERRITIN", "TIBC", "IRON ", "RETICCTPCT" in the last 72 hours. Microbiology Recent Results (from the past 240 hours)  Body fluid culture w Gram Stain     Status: None   Collection Time: 09/10/23  7:27 AM   Specimen: Peritoneal Washings; Body Fluid  Result Value Ref Range Status   Specimen Description PERITONEAL  Final   Special Requests NONE  Final   Gram Stain   Final    MODERATE WBC PRESENT, PREDOMINANTLY PMN RARE GRAM POSITIVE COCCI Performed at [facility], [address], [place], [place] [address]    Culture FEW STAPHYLOCOCCUS EPIDERMIDIS  Final   Report Status 09/12/2023 FINAL  Final   Organism ID,  Bacteria STAPHYLOCOCCUS EPIDERMIDIS  Final      Susceptibility   Staphylococcus epidermidis - MIC*    CIPROFLOXACIN <=0.5 SENSITIVE Sensitive     ERYTHROMYCIN >=8 RESISTANT Resistant     GENTAMICIN  <=0.5 SENSITIVE Sensitive     OXACILLIN <=0.25 SENSITIVE Sensitive     TETRACYCLINE 2 SENSITIVE Sensitive     VANCOMYCIN  <=0.5 SENSITIVE Sensitive     TRIMETH/SULFA <=10 SENSITIVE Sensitive     CLINDAMYCIN <=0.25 SENSITIVE Sensitive     RIFAMPIN <=0.5 SENSITIVE Sensitive     Inducible Clindamycin NEGATIVE Sensitive     * FEW STAPHYLOCOCCUS EPIDERMIDIS     Discharge Instructions:   Discharge Instructions     Call MD for:  persistant nausea and vomiting  Complete by: As directed    Call MD for:  severe uncontrolled pain   Complete by: As directed    Call MD for:  temperature >100.4   Complete by: As directed    Diet - low sodium heart healthy   Complete by: As directed    Discharge instructions   Complete by: As directed    Continue antibiotics as instructed.  You have been prescribed potassium for the next 1 week.  Please check blood work as has been scheduled.  Seek medical attention for worsening symptoms.   Increase activity slowly   Complete by: As directed    No wound care   Complete by: As directed       Allergies as of 09/14/2023       Reactions   Ace Inhibitors Anaphylaxis, Swelling   Lisinopril Swelling, Anaphylaxis   Angioedema Other reaction(s): Angioedema (ALLERGY/intolerance)   Losartan Swelling, Anaphylaxis   Cat Dander Rash        Medication List     STOP taking these medications    carvedilol  12.5 MG tablet Commonly known as: COREG    carvedilol  25 MG tablet Commonly known as: COREG    carvedilol  3.125 MG tablet Commonly known as: COREG    carvedilol  6.25 MG tablet Commonly known as: COREG    Mounjaro  5 MG/0.5ML Pen Generic drug: tirzepatide    Mounjaro  7.5 MG/0.5ML Pen Generic drug: tirzepatide    ondansetron  4 MG disintegrating  tablet Commonly known as: ZOFRAN -ODT       TAKE these medications    albuterol  108 (90 Base) MCG/ACT inhaler Commonly known as: VENTOLIN  HFA TAKE 2 PUFFS BY MOUTH EVERY 6 HOURS AS NEEDED FOR WHEEZE OR SHORTNESS OF BREATH What changed: See the new instructions.   amLODipine  5 MG tablet Commonly known as: NORVASC  Take 5 mg by mouth in the morning and at bedtime. What changed: Another medication with the same name was removed. Continue taking this medication, and follow the directions you see here.   Auryxia  1 GM 210 MG(Fe) tablet Generic drug: ferric citrate  Take 630 mg by mouth 3 (three) times daily with meals.   calcitRIOL  0.25 MCG capsule Commonly known as: ROCALTROL  Take 0.25 mcg by mouth daily. What changed: Another medication with the same name was removed. Continue taking this medication, and follow the directions you see here.   ceFAZolin  1-4 GM/50ML-% Soln Commonly known as: ANCEF  Inject 50 mLs (1 g total) into the vein daily for 21 days.   Dexcom G7 Sensor Misc Apply sensor to skin every 10 days   Dialyvite 800 0.8 MG Tabs Take 1 tablet by mouth daily.   gentamicin  cream 0.1 % Commonly known as: GARAMYCIN  Apply 1 Application topically daily as needed (catheter dressing change).   hydrALAZINE  50 MG tablet Commonly known as: APRESOLINE  Take 1 tablet (50 mg total) by mouth every 8 (eight) hours with food   lactulose  10 GM/15ML solution Commonly known as: CHRONULAC  Take 60 mLs (40 g total) by mouth every 1 hours for max of 4 doses. If  no relief call RN/MD.   METAMUCIL FIBER PO Take 2 Scoops by mouth daily.   METHYLENE BLUE PO Take 1 tablet by mouth daily.   OVER THE COUNTER MEDICATION Take 1 tablet by mouth daily. Bergavin Lopid   OVER THE COUNTER MEDICATION Take 3,000 mg by mouth daily. Omegavail 3000 mg   OVER THE COUNTER MEDICATION Take 1 tablet by mouth daily. Homocysteine   OVER THE COUNTER MEDICATION Take 10,000 Units by mouth daily. D-evail  10,000 units   OVER THE COUNTER MEDICATION Take 1 tablet by mouth daily. Natto   OVER THE COUNTER MEDICATION Take 1 tablet by mouth daily. Iberogast   polyethylene glycol powder 17 GM/SCOOP powder Commonly known as: GLYCOLAX /MIRALAX  Take 17 g by mouth daily.   potassium chloride  10 MEQ tablet Commonly known as: KLOR-CON  M Take 3 tablets (30 mEq total) by mouth daily.   spironolactone  25 MG tablet Commonly known as: ALDACTONE  Take 1 tablet (25 mg total) by mouth daily.   traZODone  50 MG tablet Commonly known as: DESYREL  Take 4 tablets (200 mg total) by mouth at bedtime.   Xphozah 30 MG Tabs Generic drug: Tenapanor HCl (CKD) Take 30 mg by mouth daily.        Follow-up Information     [facility] Go on 09/15/2023.   Why: Please arrive at 10:00 am to complete education with staff regrding antibiotics with PD. Contact information: [address] [place] [address] [phone]         Benedetta Bradley, MD Follow up in 1 week(s).   Specialty: Internal Medicine Contact information: [address] [place] [address] [phone]                  Time coordinating discharge: 39 minutes  Signed:  Wilbert Hayashi  [facility] 09/14/2023, 10:45 AM

## 2023-09-14 NOTE — Progress Notes (Signed)
 AVS completed/printed; placed at nurses station. Discharge delayed Case Management still working on sitting up services needed for patient to discharge.

## 2023-09-14 NOTE — Progress Notes (Signed)
 [facility] Progress Note  Subjective:  Seen in room F/u TNC was 84, down from 53,530 on 4/27 Pain is better, appetite poor   Vitals:   09/13/23 2029 09/14/23 0456 09/14/23 0500 09/14/23 0815  BP: (!) 158/80 104/64  133/78  Pulse: 86 71  73  Resp: 17 17  18   Temp: 98.3 F (36.8 C) 98.4 F (36.9 C)  98.2 F (36.8 C)  TempSrc: Oral Oral  Oral  SpO2: 96% 98%  94%  Weight:   123.5 kg   Height:        Exam:  alert, nad   no jvd  Chest cta bilat  Cor reg no RG  Abd soft, mod pain w/ palpation in mid / L abdomen   Ext no LE edema   Alert, NF, ox3    PD cath mid-abdomen intact  TNC on PD fluid 4/27: 53,350  GS/ culture 4/27 on PD fluid: GS +GPC, culture + staph epi                OP PD: Home Therap[facility] 6 cycles, 3L fill, 1.5hrs, 121kg, last fill 2L amlodipine  10mg , auryxia  3 tabs, calcitriol  0.41mcg, coreg  12.5 bid, spiro 25, xphoza   Assessment/Recommendations: Adrian Frye is a/an 53 y.o. male with a past medical history notable for ESRD on HD admitted with abdominal pain.    Problems: #PD catheter-related peritonitis: initial sig high cell count at 53,350. Cx+ for GPC, and on GS and staph epi on culture thus far. Was started on IV vanc/ zosyn, zosyn dc'd yesterday. Had large IV load of Vanc 2500mg  on 4/27, likely to still be therapeutic per pharm notes. IV Ancef  started 4/29 at 1 gm IV daily and IV vanc dc'd. F/u TNC today confirms positive response to IV abx. Okay for dc. His PD clinic team will pick up the antibiotic course from here (usually 2 wks for staph epi) and he will get IP antibiotics in the outpatient setting. Have d/w patient.   # ESRD: CCPD pt being dc'd today. Patient will resume his usual home PD regimen w/ PD tonight.    # Volume/ hypertension: Can continue home BP meds. Pt is up 4-5kg, he knows to use 2.5% and 4.25% fluids to get his wt's back down over the next few days   # Anemia of Chronic Kidney Disease: Hemoglobin 8.3. Some  dilution possibly. Will obtain iron  studies but hold on administration until infection is ruled out   # Secondary Hyperparathyroidism/Hyperphosphatemia: Cont home calcitriol  and auryxia    # Hypokalemia: K+ remains low, recommend KCl daily x 7 days at home, have d/w pmd   [name]  [facility] 09/14/2023, 12:35 PM  Recent Labs  Lab 09/09/23 1835 09/09/23 2248 09/10/23 0548 09/10/23 1433 09/13/23 0617 09/14/23 0701  HGB 9.7*  --  8.3*   < > 8.8* 8.9*  ALBUMIN  3.6  --   --   --   --   --   CALCIUM 8.8*  --  7.8*   < > 7.9* 8.1*  PHOS  --   --  4.9*  --   --   --   CREATININE 10.40*  --  10.79*   < > 9.39* 9.06*  K 2.1*   < > 2.5*   < > 3.0* 2.9*   < > = values in this interval not displayed.   Recent Labs  Lab 09/10/23 0548  IRON  24*  TIBC 116*  FERRITIN 1,323*   Inpatient  medications:  calcitRIOL   0.5 mcg Oral Q lunch   docusate sodium   100 mg Oral BID   ferric citrate   630 mg Oral TID WC   gentamicin  cream  1 Application Topical Daily   heparin   5,000 Units Subcutaneous Q8H   polyethylene glycol  17 g Oral Daily   potassium chloride   40 mEq Oral Once   sodium chloride  flush  3 mL Intravenous Q12H   traZODone   200 mg Oral QHS     ceFAZolin  (ANCEF ) IV 1 g (09/14/23 1141)   dialysis solution 1.5% low-MG/low-CA     acetaminophen  **OR** acetaminophen , heparin  sodium (porcine) 3,000 Units in dialysis solution 2.5% low-MG/low-CA 6,000 mL dialysis solution, HYDROmorphone  (DILAUDID ) injection, oxyCODONE , prochlorperazine , simethicone 

## 2023-09-14 NOTE — Progress Notes (Signed)
 Contacted by staff regarding pt's need for abx with PD treatments at d/c. Nephrologist and renal PA aware of need as well. Navigator contacted home therapy staff at [facility] to be advised of this need. Renal PA spoke to staff as well to discuss pt's needs and provide orders. Pt will need to go to clinic tomorrow at 10:00 am to complete education with staff regarding abx treatment with PD. Spoke to pt via phone to advise pt of appt time for tomorrow. Pt aware and agreeable to plan. Appt with home therapy staff tomorrow at clinic was added to pt's AVS.  Lauraine Polite Renal Navigator [phone]

## 2023-09-15 DIAGNOSIS — K659 Peritonitis, unspecified: Secondary | ICD-10-CM | POA: Diagnosis not present

## 2023-09-15 DIAGNOSIS — N186 End stage renal disease: Secondary | ICD-10-CM | POA: Diagnosis not present

## 2023-09-15 DIAGNOSIS — N2581 Secondary hyperparathyroidism of renal origin: Secondary | ICD-10-CM | POA: Diagnosis not present

## 2023-09-15 DIAGNOSIS — Z4932 Encounter for adequacy testing for peritoneal dialysis: Secondary | ICD-10-CM | POA: Diagnosis not present

## 2023-09-15 DIAGNOSIS — Z992 Dependence on renal dialysis: Secondary | ICD-10-CM | POA: Diagnosis not present

## 2023-09-15 DIAGNOSIS — D631 Anemia in chronic kidney disease: Secondary | ICD-10-CM | POA: Diagnosis not present

## 2023-09-16 DIAGNOSIS — K659 Peritonitis, unspecified: Secondary | ICD-10-CM | POA: Diagnosis not present

## 2023-09-16 DIAGNOSIS — N186 End stage renal disease: Secondary | ICD-10-CM | POA: Diagnosis not present

## 2023-09-16 DIAGNOSIS — Z4932 Encounter for adequacy testing for peritoneal dialysis: Secondary | ICD-10-CM | POA: Diagnosis not present

## 2023-09-16 DIAGNOSIS — D631 Anemia in chronic kidney disease: Secondary | ICD-10-CM | POA: Diagnosis not present

## 2023-09-16 DIAGNOSIS — N2581 Secondary hyperparathyroidism of renal origin: Secondary | ICD-10-CM | POA: Diagnosis not present

## 2023-09-16 DIAGNOSIS — Z992 Dependence on renal dialysis: Secondary | ICD-10-CM | POA: Diagnosis not present

## 2023-09-17 DIAGNOSIS — N2581 Secondary hyperparathyroidism of renal origin: Secondary | ICD-10-CM | POA: Diagnosis not present

## 2023-09-17 DIAGNOSIS — K659 Peritonitis, unspecified: Secondary | ICD-10-CM | POA: Diagnosis not present

## 2023-09-17 DIAGNOSIS — N186 End stage renal disease: Secondary | ICD-10-CM | POA: Diagnosis not present

## 2023-09-17 DIAGNOSIS — D631 Anemia in chronic kidney disease: Secondary | ICD-10-CM | POA: Diagnosis not present

## 2023-09-17 DIAGNOSIS — Z4932 Encounter for adequacy testing for peritoneal dialysis: Secondary | ICD-10-CM | POA: Diagnosis not present

## 2023-09-17 DIAGNOSIS — Z992 Dependence on renal dialysis: Secondary | ICD-10-CM | POA: Diagnosis not present

## 2023-09-18 DIAGNOSIS — Z4932 Encounter for adequacy testing for peritoneal dialysis: Secondary | ICD-10-CM | POA: Diagnosis not present

## 2023-09-18 DIAGNOSIS — D631 Anemia in chronic kidney disease: Secondary | ICD-10-CM | POA: Diagnosis not present

## 2023-09-18 DIAGNOSIS — Z992 Dependence on renal dialysis: Secondary | ICD-10-CM | POA: Diagnosis not present

## 2023-09-18 DIAGNOSIS — N186 End stage renal disease: Secondary | ICD-10-CM | POA: Diagnosis not present

## 2023-09-18 DIAGNOSIS — K659 Peritonitis, unspecified: Secondary | ICD-10-CM | POA: Diagnosis not present

## 2023-09-18 DIAGNOSIS — N2581 Secondary hyperparathyroidism of renal origin: Secondary | ICD-10-CM | POA: Diagnosis not present

## 2023-09-19 DIAGNOSIS — K659 Peritonitis, unspecified: Secondary | ICD-10-CM | POA: Diagnosis not present

## 2023-09-19 DIAGNOSIS — Z992 Dependence on renal dialysis: Secondary | ICD-10-CM | POA: Diagnosis not present

## 2023-09-19 DIAGNOSIS — Z4932 Encounter for adequacy testing for peritoneal dialysis: Secondary | ICD-10-CM | POA: Diagnosis not present

## 2023-09-19 DIAGNOSIS — D352 Benign neoplasm of pituitary gland: Secondary | ICD-10-CM | POA: Diagnosis not present

## 2023-09-19 DIAGNOSIS — N186 End stage renal disease: Secondary | ICD-10-CM | POA: Diagnosis not present

## 2023-09-19 DIAGNOSIS — R188 Other ascites: Secondary | ICD-10-CM | POA: Diagnosis not present

## 2023-09-19 DIAGNOSIS — D631 Anemia in chronic kidney disease: Secondary | ICD-10-CM | POA: Diagnosis not present

## 2023-09-19 DIAGNOSIS — N2581 Secondary hyperparathyroidism of renal origin: Secondary | ICD-10-CM | POA: Diagnosis not present

## 2023-09-20 DIAGNOSIS — D631 Anemia in chronic kidney disease: Secondary | ICD-10-CM | POA: Diagnosis not present

## 2023-09-20 DIAGNOSIS — Z4932 Encounter for adequacy testing for peritoneal dialysis: Secondary | ICD-10-CM | POA: Diagnosis not present

## 2023-09-20 DIAGNOSIS — Z992 Dependence on renal dialysis: Secondary | ICD-10-CM | POA: Diagnosis not present

## 2023-09-20 DIAGNOSIS — K659 Peritonitis, unspecified: Secondary | ICD-10-CM | POA: Diagnosis not present

## 2023-09-20 DIAGNOSIS — N186 End stage renal disease: Secondary | ICD-10-CM | POA: Diagnosis not present

## 2023-09-20 DIAGNOSIS — N2581 Secondary hyperparathyroidism of renal origin: Secondary | ICD-10-CM | POA: Diagnosis not present

## 2023-09-21 DIAGNOSIS — N2581 Secondary hyperparathyroidism of renal origin: Secondary | ICD-10-CM | POA: Diagnosis not present

## 2023-09-21 DIAGNOSIS — N186 End stage renal disease: Secondary | ICD-10-CM | POA: Diagnosis not present

## 2023-09-21 DIAGNOSIS — N189 Chronic kidney disease, unspecified: Secondary | ICD-10-CM | POA: Diagnosis not present

## 2023-09-21 DIAGNOSIS — K659 Peritonitis, unspecified: Secondary | ICD-10-CM | POA: Diagnosis not present

## 2023-09-21 DIAGNOSIS — Z4932 Encounter for adequacy testing for peritoneal dialysis: Secondary | ICD-10-CM | POA: Diagnosis not present

## 2023-09-21 DIAGNOSIS — Z992 Dependence on renal dialysis: Secondary | ICD-10-CM | POA: Diagnosis not present

## 2023-09-21 DIAGNOSIS — D631 Anemia in chronic kidney disease: Secondary | ICD-10-CM | POA: Diagnosis not present

## 2023-09-22 DIAGNOSIS — Z992 Dependence on renal dialysis: Secondary | ICD-10-CM | POA: Diagnosis not present

## 2023-09-22 DIAGNOSIS — D631 Anemia in chronic kidney disease: Secondary | ICD-10-CM | POA: Diagnosis not present

## 2023-09-22 DIAGNOSIS — N186 End stage renal disease: Secondary | ICD-10-CM | POA: Diagnosis not present

## 2023-09-22 DIAGNOSIS — Z4932 Encounter for adequacy testing for peritoneal dialysis: Secondary | ICD-10-CM | POA: Diagnosis not present

## 2023-09-22 DIAGNOSIS — N2581 Secondary hyperparathyroidism of renal origin: Secondary | ICD-10-CM | POA: Diagnosis not present

## 2023-09-22 DIAGNOSIS — K659 Peritonitis, unspecified: Secondary | ICD-10-CM | POA: Diagnosis not present

## 2023-09-23 DIAGNOSIS — Z992 Dependence on renal dialysis: Secondary | ICD-10-CM | POA: Diagnosis not present

## 2023-09-23 DIAGNOSIS — Z4932 Encounter for adequacy testing for peritoneal dialysis: Secondary | ICD-10-CM | POA: Diagnosis not present

## 2023-09-23 DIAGNOSIS — K659 Peritonitis, unspecified: Secondary | ICD-10-CM | POA: Diagnosis not present

## 2023-09-23 DIAGNOSIS — N2581 Secondary hyperparathyroidism of renal origin: Secondary | ICD-10-CM | POA: Diagnosis not present

## 2023-09-23 DIAGNOSIS — D631 Anemia in chronic kidney disease: Secondary | ICD-10-CM | POA: Diagnosis not present

## 2023-09-23 DIAGNOSIS — N186 End stage renal disease: Secondary | ICD-10-CM | POA: Diagnosis not present

## 2023-09-24 DIAGNOSIS — N2581 Secondary hyperparathyroidism of renal origin: Secondary | ICD-10-CM | POA: Diagnosis not present

## 2023-09-24 DIAGNOSIS — Z4932 Encounter for adequacy testing for peritoneal dialysis: Secondary | ICD-10-CM | POA: Diagnosis not present

## 2023-09-24 DIAGNOSIS — Z992 Dependence on renal dialysis: Secondary | ICD-10-CM | POA: Diagnosis not present

## 2023-09-24 DIAGNOSIS — N186 End stage renal disease: Secondary | ICD-10-CM | POA: Diagnosis not present

## 2023-09-24 DIAGNOSIS — K659 Peritonitis, unspecified: Secondary | ICD-10-CM | POA: Diagnosis not present

## 2023-09-24 DIAGNOSIS — D631 Anemia in chronic kidney disease: Secondary | ICD-10-CM | POA: Diagnosis not present

## 2023-09-25 DIAGNOSIS — N2581 Secondary hyperparathyroidism of renal origin: Secondary | ICD-10-CM | POA: Diagnosis not present

## 2023-09-25 DIAGNOSIS — Z4932 Encounter for adequacy testing for peritoneal dialysis: Secondary | ICD-10-CM | POA: Diagnosis not present

## 2023-09-25 DIAGNOSIS — Z992 Dependence on renal dialysis: Secondary | ICD-10-CM | POA: Diagnosis not present

## 2023-09-25 DIAGNOSIS — K659 Peritonitis, unspecified: Secondary | ICD-10-CM | POA: Diagnosis not present

## 2023-09-25 DIAGNOSIS — N186 End stage renal disease: Secondary | ICD-10-CM | POA: Diagnosis not present

## 2023-09-25 DIAGNOSIS — D631 Anemia in chronic kidney disease: Secondary | ICD-10-CM | POA: Diagnosis not present

## 2023-09-25 DIAGNOSIS — I10 Essential (primary) hypertension: Secondary | ICD-10-CM | POA: Diagnosis not present

## 2023-09-26 ENCOUNTER — Ambulatory Visit: Attending: Cardiology | Admitting: Cardiology

## 2023-09-26 ENCOUNTER — Other Ambulatory Visit (HOSPITAL_BASED_OUTPATIENT_CLINIC_OR_DEPARTMENT_OTHER): Payer: Self-pay

## 2023-09-26 ENCOUNTER — Encounter: Payer: Self-pay | Admitting: Cardiology

## 2023-09-26 ENCOUNTER — Other Ambulatory Visit: Payer: Self-pay

## 2023-09-26 VITALS — BP 156/92 | HR 87 | Ht 67.0 in | Wt 273.0 lb

## 2023-09-26 DIAGNOSIS — G4733 Obstructive sleep apnea (adult) (pediatric): Secondary | ICD-10-CM | POA: Diagnosis not present

## 2023-09-26 DIAGNOSIS — E7800 Pure hypercholesterolemia, unspecified: Secondary | ICD-10-CM

## 2023-09-26 DIAGNOSIS — I5032 Chronic diastolic (congestive) heart failure: Secondary | ICD-10-CM | POA: Diagnosis not present

## 2023-09-26 DIAGNOSIS — K659 Peritonitis, unspecified: Secondary | ICD-10-CM | POA: Diagnosis not present

## 2023-09-26 DIAGNOSIS — G4700 Insomnia, unspecified: Secondary | ICD-10-CM | POA: Diagnosis not present

## 2023-09-26 DIAGNOSIS — D631 Anemia in chronic kidney disease: Secondary | ICD-10-CM | POA: Diagnosis not present

## 2023-09-26 DIAGNOSIS — N186 End stage renal disease: Secondary | ICD-10-CM | POA: Diagnosis not present

## 2023-09-26 DIAGNOSIS — Z4932 Encounter for adequacy testing for peritoneal dialysis: Secondary | ICD-10-CM | POA: Diagnosis not present

## 2023-09-26 DIAGNOSIS — N2581 Secondary hyperparathyroidism of renal origin: Secondary | ICD-10-CM | POA: Diagnosis not present

## 2023-09-26 DIAGNOSIS — I10 Essential (primary) hypertension: Secondary | ICD-10-CM

## 2023-09-26 DIAGNOSIS — Z992 Dependence on renal dialysis: Secondary | ICD-10-CM | POA: Diagnosis not present

## 2023-09-26 DIAGNOSIS — I371 Nonrheumatic pulmonary valve insufficiency: Secondary | ICD-10-CM

## 2023-09-26 DIAGNOSIS — R0989 Other specified symptoms and signs involving the circulatory and respiratory systems: Secondary | ICD-10-CM

## 2023-09-26 MED ORDER — ASPIRIN 81 MG PO TBEC: 81.0000 mg | DELAYED_RELEASE_TABLET | Freq: Every day | ORAL | 3 refills | Status: AC

## 2023-09-26 MED ORDER — LIOTHYRONINE SODIUM 5 MCG PO TABS
10.0000 ug | ORAL_TABLET | Freq: Every morning | ORAL | 3 refills | Status: AC
  Filled 2023-09-26: qty 180, 90d supply, fill #0

## 2023-09-26 NOTE — Progress Notes (Signed)
 Date:  09/15/2021   ID:  Adrian Frye, DOB April 27, 1971, MRN [id number]   Cardiology Office Note:    Date:  09/26/2023   ID:  Adrian Frye, DOB [birth year], MRN [id number]  PCP:  Benedetta Bradley, MD  Cardiologist:  None    Referring MD: Benedetta Bradley, *   Chief Complaint  Patient presents with   Sleep Apnea   Hypertension   Cardiomyopathy    History of Present Illness:    Adrian Frye is a 53 y.o. male with a hx of  DCM, HTN, OSA on CPAP and obesity.  He also has severe PR by TEE and was seen at [facility] in [place] and Frye is for a percutaneous PV replacement with a Harmony valve but the procedure has been delayed due to acute peritonitis. He was recently hospitalized for acute peritonitis.  He is on chronic peritoneal dialysis for ESRD.   He is doing well today.  He has chronic DOE related to obesity.  He denies any chest pain or pressure, PND, orthopnea, LE edema, palpitations or syncope. He has dizziness when he stands up and feels like he is drunk.  He is compliant with his meds and is tolerating meds with no SE.    He is doing well with his PAP device and thinks that he has gotten used to it.  He tolerates the nasal mask and feels the pressure is adequate.  Since going on PAP he feels rested in the am and has no significant daytime sleepiness.  He denies any significant  nasal dryness or nasal congestion. He has mouth dryness but does not use a chin strap. He does not think that he snores.     Past Medical History:  Diagnosis Date   Allergic rhinitis    Allergy    Anemia    Asthma    as a child   Bacteremia    Benign essential hypertension    Carotid artery stenosis, asymptomatic, left    1-39% left by dopplers 10/2022   CHF (congestive heart failure) (HCC)    Dilated idiopathic cardiomyopathy (HCC)    now resoved with EF 55% by echo 2011   Edema    Encounter for blood transfusion    Endocarditis    ESRD (end stage  renal disease) (HCC)    ESRD on peritoneal dialysis (HCC)    Heart disease    Hyperlipidemia    Hyperparathyroidism, secondary (HCC)    Hypertension    Morbid obesity (HCC)    Sleep apnea    c-pap   UGI bleed 2011   ASA    Past Surgical History:  Procedure Laterality Date   ACHILLES TENDON REPAIR     ruptured; right   AV FISTULA PLACEMENT Left 11/03/2017   Procedure: ARTERIOVENOUS (AV) FISTULA CREATION  left upper arm;  Surgeon: Mayo Speck, MD;  Location: [facility] OR;  Service: Vascular;  Laterality: Left;   COLONOSCOPY     COLONOSCOPY WITH PROPOFOL  N/A 08/11/2022   Procedure: COLONOSCOPY WITH PROPOFOL ;  Surgeon: Tobin Forts, MD;  Location: [facility] ENDOSCOPY;  Service: Gastroenterology;  Laterality: N/A;   DIALYSIS FISTULA CREATION     INSERTION OF DIALYSIS CATHETER N/A 11/03/2017   Procedure: INSERTION OF DIALYSIS CATHETER - RIGHT INTERNAL JUGULAR PLACEMENT;  Surgeon: Mayo Speck, MD;  Location: [facility] OR;  Service: Vascular;  Laterality: N/A;   IR FLUORO GUIDE CV LINE LEFT  01/24/2020   IR REMOVAL  TUN CV CATH W/O FL  01/21/2020   IR REMOVAL TUN CV CATH W/O FL  03/06/2020   IR US  GUIDE VASC ACCESS LEFT  01/24/2020   KIDNEY FAILURE     LAPAROSCOPIC GASTROTOMY W/ REPAIR OF ULCER     PLEURAL SCARIFICATION     left, football trauma-chest tube   STOMACH SURGERY     TEE WITHOUT CARDIOVERSION N/A 01/22/2020   Procedure: TRANSESOPHAGEAL ECHOCARDIOGRAM (TEE);  Surgeon: Hazle Lites, MD;  Location: [facility] ENDOSCOPY;  Service: Cardiovascular;  Laterality: N/A;   TEE WITHOUT CARDIOVERSION N/A 10/31/2022   Procedure: TRANSESOPHAGEAL ECHOCARDIOGRAM;  Surgeon: Jacqueline Matsu, MD;  Location: [facility] INVASIVE CV LAB;  Service: Cardiovascular;  Laterality: N/A;   TONSILLECTOMY     TONSILLECTOMY     UPPER GASTROINTESTINAL ENDOSCOPY      Current Medications: Current Meds  Medication Sig   albuterol  (VENTOLIN  HFA) 108 (90 Base) MCG/ACT inhaler TAKE 2 PUFFS BY MOUTH EVERY 6 HOURS AS NEEDED FOR WHEEZE OR  SHORTNESS OF BREATH (Patient taking differently: Inhale 3 puffs into the lungs daily as needed for wheezing or shortness of breath.)   amLODipine  (NORVASC ) 5 MG tablet Take 5 mg by mouth in the morning and at bedtime.   AURYXIA  1 GM 210 MG(Fe) tablet Take 630 mg by mouth 3 (three) times daily with meals.   B Complex-C-Folic Acid  (DIALYVITE 800) 0.8 MG TABS Take 1 tablet by mouth daily.   calcitRIOL  (ROCALTROL ) 0.25 MCG capsule Take 0.25 mcg by mouth daily.   ceFAZolin  (ANCEF ) 1-4 GM/50ML-% SOLN Inject 50 mLs (1 g total) into the vein daily for 21 days.   Continuous Glucose Sensor (DEXCOM G7 SENSOR) MISC Apply sensor to skin every 10 days   gentamicin  cream (GARAMYCIN ) 0.1 % Apply 1 Application topically daily as needed (catheter dressing change).   hydrALAZINE  (APRESOLINE ) 50 MG tablet Take 1 tablet (50 mg total) by mouth every 8 (eight) hours with food   lactulose  (CHRONULAC ) 10 GM/15ML solution Take 60 mLs (40 g total) by mouth every 1 hours for max of 4 doses. If  no relief call RN/MD.   liothyronine (CYTOMEL) 5 MCG tablet Take 2 tablets (10 mcg total) by mouth every morning 30 minutes before food, drink or supplements.   METAMUCIL FIBER PO Take 2 Scoops by mouth daily.   METHYLENE BLUE PO Take 1 tablet by mouth daily.   OVER THE COUNTER MEDICATION Take 1 tablet by mouth daily. Bergavin Lopid   OVER THE COUNTER MEDICATION Take 3,000 mg by mouth daily. Omegavail 3000 mg   OVER THE COUNTER MEDICATION Take 1 tablet by mouth daily. Homocysteine   OVER THE COUNTER MEDICATION Take 10,000 Units by mouth daily. D-evail 10,000 units   OVER THE COUNTER MEDICATION Take 1 tablet by mouth daily. Natto   OVER THE COUNTER MEDICATION Take 1 tablet by mouth daily. Iberogast   polyethylene glycol powder (GLYCOLAX /MIRALAX ) 17 GM/SCOOP powder Take 17 g by mouth daily.   potassium chloride  (KLOR-CON  M) 10 MEQ tablet Take 3 tablets (30 mEq total) by mouth daily.   spironolactone  (ALDACTONE ) 25 MG tablet Take 1  tablet (25 mg total) by mouth daily.   Tenapanor HCl, CKD, (XPHOZAH) 30 MG TABS Take 30 mg by mouth daily.   traZODone  (DESYREL ) 50 MG tablet Take 4 tablets (200 mg total) by mouth at bedtime.     Allergies:   Ace inhibitors, Lisinopril, Losartan, and Cat dander   Social History   Socioeconomic History   Marital status: Married  Spouse name: Not on file   Number of children: 2   Years of education: Not on file   Highest education level: Not on file  Occupational History   Occupation: real Psychologist, occupational  Tobacco Use   Smoking status: Former    Current packs/day: 0.00    Types: Cigarettes    Start date: 05/17/2003    Quit date: 05/16/2009    Years since quitting: 14.3   Smokeless tobacco: Never   Tobacco comments:    smoked 1 pack per week   Vaping Use   Vaping status: Never Used  Substance and Sexual Activity   Alcohol use: Not Currently    Alcohol/week: 2.0 standard drinks of alcohol    Types: 2 Cans of beer per week    Comment: occasionally   Drug use: No   Sexual activity: Yes  Other Topics Concern   Not on file  Social History Narrative   Not on file   Social Drivers of Health   Financial Resource Strain: Low Risk  (03/11/2022)   Overall Financial Resource Strain (CARDIA)    Difficulty of Paying Living Expenses: Not hard at all  Food Insecurity: No Food Insecurity (09/09/2023)   Hunger Vital Sign    Worried About Running Out of Food in the Last Year: Never true    Ran Out of Food in the Last Year: Never true  Transportation Needs: No Transportation Needs (09/09/2023)   PRAPARE - Administrator, Civil Service (Medical): No    Lack of Transportation (Non-Medical): No  Physical Activity: Sufficiently Active (03/11/2022)   Exercise Vital Sign    Days of Exercise per Week: 3 days    Minutes of Exercise per Session: 60 min  Stress: No Stress Concern Present (03/11/2022)   [employer] of Occupational Health - Occupational Stress Questionnaire     Feeling of Stress : Not at all  Social Connections: Unknown (09/27/2021)   Received from [facility]   Social Network    Social Network: Not on file     Family History: The patient's family history includes Diabetes in his mother; Heart disease in his father; Heart failure in his mother; Hypertension in his brother, brother, and mother. There is no history of Colon cancer, Esophageal cancer, Rectal cancer, or Stomach cancer.  ROS:   Please see the history of present illness.    ROS  All other systems reviewed and negative.   EKGs/Labs/Other Studies Reviewed:    The following studies were reviewed today: PAP compliance download   Recent Labs: 09/09/2023: ALT 16 09/14/2023: BUN 30; Creatinine, Ser 9.06; Hemoglobin 8.9; Magnesium  2.0; Platelets 209; Potassium 2.9; Sodium 131   Recent Lipid Panel No results found for: "CHOL", "TRIG", "HDL", "CHOLHDL", "VLDL", "LDLCALC", "LDLDIRECT"   Physical Exam:    VS:  BP (!) 156/92   Pulse 87   Ht 5\' 7"  (1.702 m)   Wt 273 lb (123.8 kg)   SpO2 96%   BMI 42.76 kg/m     Wt Readings from Last 3 Encounters:  09/26/23 273 lb (123.8 kg)  09/14/23 272 lb 3.2 oz (123.5 kg)  10/31/22 268 lb (121.6 kg)    GEN: Well nourished, well developed in no acute distress HEENT: Normal NECK: No JVD; left carotid bruit LYMPHATICS: No lymphadenopathy CARDIAC:RRR, no rubs, gallops 2/6 SM at RUSB to LUSB RESPIRATORY:  Clear to auscultation without rales, wheezing or rhonchi  ABDOMEN: Soft, non-tender, non-distended MUSCULOSKELETAL:  No edema; No deformity  SKIN:  Warm and dry NEUROLOGIC:  Alert and oriented x 3 PSYCHIATRIC:  Normal affect  ASSESSMENT:    1. OSA on CPAP   2. Primary hypertension   3. Insomnia, unspecified type   4. Chronic diastolic heart failure (HCC)   5. Left carotid bruit   6. Nonrheumatic pulmonary valve insufficiency     Frye:    In order of problems listed above:  #OSA - The patient is tolerating PAP  therapy well without any problems. The PAP download performed by his DME was personally reviewed and interpreted by me today and showed an AHI of 3.8 /hr on 20 cm H2O with 100 % compliance in using more than 4 hours nightly.  The patient has been using and benefiting from PAP use and will continue to benefit from therapy.   #HTN - BP borderline controlled on exam today - Continue amlodipine  5 mg BID, hydralazine  50 mg 3 times daily and spironolactone  25 mg daily with as needed refills  Insomnia - He is doing well on trazodone  200 mg nightly  #Chronic diastolic CHF -He appears euvolemic on exam today -volume managed by peritoneal dialysis  #Left carotid artery bruit #HLD -his AVF is on the left so that may be the etiology of his bruit -Carotid Doppler 10/24/2022 showed 1 to 39% left carotid artery stenosis -Start ASA 81 mg daily -I will get a copy of his FLP from his renal MD from this month  #Heart murmur #Pulmonic regurgitation - 2D echo for 2024 and TEE 11/03/2022 showed EF 50 to 55% with moderate LVH, trivial MR and AR and mild aortic valve sclerosis as well as severe PR -was seen at [facility] in [place] and Frye is for a percutaneous PV replacement with a Harmony valve but the procedure has been delayed due to acute peritonitis.    Time Spent: 20 minutes total time of encounter, including 15 minutes spent in face-to-face patient care on the date of this encounter.   Medication Adjustments/Labs and Tests Ordered: Current medicines are reviewed at length with the patient today.  Concerns regarding medicines are outlined above.  No orders of the defined types were placed in this encounter.  No orders of the defined types were placed in this encounter.   Signed, Gaylyn Keas, MD  09/26/2023 9:59 AM    [facility]

## 2023-09-26 NOTE — Patient Instructions (Signed)
 Medication Instructions:  Please START taking aspirin 81 mg daily.  This is a medication that is available over the counter.  *If you need a refill on your cardiac medications before your next appointment, please call your pharmacy*  Lab Work: None.  If you have labs (blood work) drawn today and your tests are completely normal, you will receive your results only by: MyChart Message (if you have MyChart) OR A paper copy in the mail If you have any lab test that is abnormal or we need to change your treatment, we will call you to review the results.  Testing/Procedures: None.  Follow-Up: At [facility], you and your health needs are our priority.  As part of our continuing mission to provide you with exceptional heart care, our providers are all part of one team.  This team includes your primary Cardiologist (physician) and Advanced Practice Providers or APPs (Physician Assistants and Nurse Practitioners) who all work together to provide you with the care you need, when you need it.  Your next appointment:   1 year(s)  Provider:   Dr. Gaylyn Keas, MD

## 2023-09-26 NOTE — Addendum Note (Signed)
 Addended by: Cherylyn Cos on: 09/26/2023 10:22 AM   Modules accepted: Orders

## 2023-09-27 DIAGNOSIS — Z992 Dependence on renal dialysis: Secondary | ICD-10-CM | POA: Diagnosis not present

## 2023-09-27 DIAGNOSIS — Z4932 Encounter for adequacy testing for peritoneal dialysis: Secondary | ICD-10-CM | POA: Diagnosis not present

## 2023-09-27 DIAGNOSIS — N186 End stage renal disease: Secondary | ICD-10-CM | POA: Diagnosis not present

## 2023-09-27 DIAGNOSIS — N2581 Secondary hyperparathyroidism of renal origin: Secondary | ICD-10-CM | POA: Diagnosis not present

## 2023-09-27 DIAGNOSIS — K659 Peritonitis, unspecified: Secondary | ICD-10-CM | POA: Diagnosis not present

## 2023-09-27 DIAGNOSIS — D631 Anemia in chronic kidney disease: Secondary | ICD-10-CM | POA: Diagnosis not present

## 2023-09-28 DIAGNOSIS — D631 Anemia in chronic kidney disease: Secondary | ICD-10-CM | POA: Diagnosis not present

## 2023-09-28 DIAGNOSIS — N2581 Secondary hyperparathyroidism of renal origin: Secondary | ICD-10-CM | POA: Diagnosis not present

## 2023-09-28 DIAGNOSIS — K659 Peritonitis, unspecified: Secondary | ICD-10-CM | POA: Diagnosis not present

## 2023-09-28 DIAGNOSIS — Z4932 Encounter for adequacy testing for peritoneal dialysis: Secondary | ICD-10-CM | POA: Diagnosis not present

## 2023-09-28 DIAGNOSIS — N186 End stage renal disease: Secondary | ICD-10-CM | POA: Diagnosis not present

## 2023-09-28 DIAGNOSIS — Z992 Dependence on renal dialysis: Secondary | ICD-10-CM | POA: Diagnosis not present

## 2023-09-29 DIAGNOSIS — D631 Anemia in chronic kidney disease: Secondary | ICD-10-CM | POA: Diagnosis not present

## 2023-09-29 DIAGNOSIS — K659 Peritonitis, unspecified: Secondary | ICD-10-CM | POA: Diagnosis not present

## 2023-09-29 DIAGNOSIS — Z992 Dependence on renal dialysis: Secondary | ICD-10-CM | POA: Diagnosis not present

## 2023-09-29 DIAGNOSIS — N2581 Secondary hyperparathyroidism of renal origin: Secondary | ICD-10-CM | POA: Diagnosis not present

## 2023-09-29 DIAGNOSIS — Z4932 Encounter for adequacy testing for peritoneal dialysis: Secondary | ICD-10-CM | POA: Diagnosis not present

## 2023-09-29 DIAGNOSIS — N186 End stage renal disease: Secondary | ICD-10-CM | POA: Diagnosis not present

## 2023-09-30 DIAGNOSIS — N186 End stage renal disease: Secondary | ICD-10-CM | POA: Diagnosis not present

## 2023-09-30 DIAGNOSIS — D631 Anemia in chronic kidney disease: Secondary | ICD-10-CM | POA: Diagnosis not present

## 2023-09-30 DIAGNOSIS — Z4932 Encounter for adequacy testing for peritoneal dialysis: Secondary | ICD-10-CM | POA: Diagnosis not present

## 2023-09-30 DIAGNOSIS — K659 Peritonitis, unspecified: Secondary | ICD-10-CM | POA: Diagnosis not present

## 2023-09-30 DIAGNOSIS — Z992 Dependence on renal dialysis: Secondary | ICD-10-CM | POA: Diagnosis not present

## 2023-09-30 DIAGNOSIS — N2581 Secondary hyperparathyroidism of renal origin: Secondary | ICD-10-CM | POA: Diagnosis not present

## 2023-10-01 DIAGNOSIS — K659 Peritonitis, unspecified: Secondary | ICD-10-CM | POA: Diagnosis not present

## 2023-10-01 DIAGNOSIS — D631 Anemia in chronic kidney disease: Secondary | ICD-10-CM | POA: Diagnosis not present

## 2023-10-01 DIAGNOSIS — Z992 Dependence on renal dialysis: Secondary | ICD-10-CM | POA: Diagnosis not present

## 2023-10-01 DIAGNOSIS — N2581 Secondary hyperparathyroidism of renal origin: Secondary | ICD-10-CM | POA: Diagnosis not present

## 2023-10-01 DIAGNOSIS — N186 End stage renal disease: Secondary | ICD-10-CM | POA: Diagnosis not present

## 2023-10-01 DIAGNOSIS — Z4932 Encounter for adequacy testing for peritoneal dialysis: Secondary | ICD-10-CM | POA: Diagnosis not present

## 2023-10-02 DIAGNOSIS — N186 End stage renal disease: Secondary | ICD-10-CM | POA: Diagnosis not present

## 2023-10-02 DIAGNOSIS — N2581 Secondary hyperparathyroidism of renal origin: Secondary | ICD-10-CM | POA: Diagnosis not present

## 2023-10-02 DIAGNOSIS — Z992 Dependence on renal dialysis: Secondary | ICD-10-CM | POA: Diagnosis not present

## 2023-10-02 DIAGNOSIS — K659 Peritonitis, unspecified: Secondary | ICD-10-CM | POA: Diagnosis not present

## 2023-10-02 DIAGNOSIS — D631 Anemia in chronic kidney disease: Secondary | ICD-10-CM | POA: Diagnosis not present

## 2023-10-02 DIAGNOSIS — Z4932 Encounter for adequacy testing for peritoneal dialysis: Secondary | ICD-10-CM | POA: Diagnosis not present

## 2023-10-03 DIAGNOSIS — Z4932 Encounter for adequacy testing for peritoneal dialysis: Secondary | ICD-10-CM | POA: Diagnosis not present

## 2023-10-03 DIAGNOSIS — Z992 Dependence on renal dialysis: Secondary | ICD-10-CM | POA: Diagnosis not present

## 2023-10-03 DIAGNOSIS — N186 End stage renal disease: Secondary | ICD-10-CM | POA: Diagnosis not present

## 2023-10-03 DIAGNOSIS — H903 Sensorineural hearing loss, bilateral: Secondary | ICD-10-CM | POA: Diagnosis not present

## 2023-10-03 DIAGNOSIS — D631 Anemia in chronic kidney disease: Secondary | ICD-10-CM | POA: Diagnosis not present

## 2023-10-03 DIAGNOSIS — K659 Peritonitis, unspecified: Secondary | ICD-10-CM | POA: Diagnosis not present

## 2023-10-03 DIAGNOSIS — N2581 Secondary hyperparathyroidism of renal origin: Secondary | ICD-10-CM | POA: Diagnosis not present

## 2023-10-04 DIAGNOSIS — K659 Peritonitis, unspecified: Secondary | ICD-10-CM | POA: Diagnosis not present

## 2023-10-04 DIAGNOSIS — Z992 Dependence on renal dialysis: Secondary | ICD-10-CM | POA: Diagnosis not present

## 2023-10-04 DIAGNOSIS — D631 Anemia in chronic kidney disease: Secondary | ICD-10-CM | POA: Diagnosis not present

## 2023-10-04 DIAGNOSIS — Z4932 Encounter for adequacy testing for peritoneal dialysis: Secondary | ICD-10-CM | POA: Diagnosis not present

## 2023-10-04 DIAGNOSIS — N2581 Secondary hyperparathyroidism of renal origin: Secondary | ICD-10-CM | POA: Diagnosis not present

## 2023-10-04 DIAGNOSIS — N186 End stage renal disease: Secondary | ICD-10-CM | POA: Diagnosis not present

## 2023-10-05 DIAGNOSIS — I10 Essential (primary) hypertension: Secondary | ICD-10-CM | POA: Diagnosis not present

## 2023-10-05 DIAGNOSIS — E1122 Type 2 diabetes mellitus with diabetic chronic kidney disease: Secondary | ICD-10-CM | POA: Diagnosis not present

## 2023-10-05 DIAGNOSIS — Z992 Dependence on renal dialysis: Secondary | ICD-10-CM | POA: Diagnosis not present

## 2023-10-05 DIAGNOSIS — K659 Peritonitis, unspecified: Secondary | ICD-10-CM | POA: Diagnosis not present

## 2023-10-05 DIAGNOSIS — D631 Anemia in chronic kidney disease: Secondary | ICD-10-CM | POA: Diagnosis not present

## 2023-10-05 DIAGNOSIS — N2581 Secondary hyperparathyroidism of renal origin: Secondary | ICD-10-CM | POA: Diagnosis not present

## 2023-10-05 DIAGNOSIS — Z6841 Body Mass Index (BMI) 40.0 and over, adult: Secondary | ICD-10-CM | POA: Diagnosis not present

## 2023-10-05 DIAGNOSIS — E7800 Pure hypercholesterolemia, unspecified: Secondary | ICD-10-CM | POA: Diagnosis not present

## 2023-10-05 DIAGNOSIS — K760 Fatty (change of) liver, not elsewhere classified: Secondary | ICD-10-CM | POA: Diagnosis not present

## 2023-10-05 DIAGNOSIS — Z4932 Encounter for adequacy testing for peritoneal dialysis: Secondary | ICD-10-CM | POA: Diagnosis not present

## 2023-10-05 DIAGNOSIS — N186 End stage renal disease: Secondary | ICD-10-CM | POA: Diagnosis not present

## 2023-10-06 ENCOUNTER — Other Ambulatory Visit (HOSPITAL_BASED_OUTPATIENT_CLINIC_OR_DEPARTMENT_OTHER): Payer: Self-pay

## 2023-10-06 DIAGNOSIS — K659 Peritonitis, unspecified: Secondary | ICD-10-CM | POA: Diagnosis not present

## 2023-10-06 DIAGNOSIS — Z992 Dependence on renal dialysis: Secondary | ICD-10-CM | POA: Diagnosis not present

## 2023-10-06 DIAGNOSIS — N186 End stage renal disease: Secondary | ICD-10-CM | POA: Diagnosis not present

## 2023-10-06 DIAGNOSIS — D631 Anemia in chronic kidney disease: Secondary | ICD-10-CM | POA: Diagnosis not present

## 2023-10-06 DIAGNOSIS — Z4932 Encounter for adequacy testing for peritoneal dialysis: Secondary | ICD-10-CM | POA: Diagnosis not present

## 2023-10-06 DIAGNOSIS — N2581 Secondary hyperparathyroidism of renal origin: Secondary | ICD-10-CM | POA: Diagnosis not present

## 2023-10-06 MED ORDER — MOUNJARO 5 MG/0.5ML ~~LOC~~ SOAJ
5.0000 mg | SUBCUTANEOUS | 0 refills | Status: AC
Start: 2023-10-05 — End: ?
  Filled 2023-10-06: qty 2, 28d supply, fill #0

## 2023-10-07 DIAGNOSIS — K659 Peritonitis, unspecified: Secondary | ICD-10-CM | POA: Diagnosis not present

## 2023-10-07 DIAGNOSIS — Z992 Dependence on renal dialysis: Secondary | ICD-10-CM | POA: Diagnosis not present

## 2023-10-07 DIAGNOSIS — N2581 Secondary hyperparathyroidism of renal origin: Secondary | ICD-10-CM | POA: Diagnosis not present

## 2023-10-07 DIAGNOSIS — Z4932 Encounter for adequacy testing for peritoneal dialysis: Secondary | ICD-10-CM | POA: Diagnosis not present

## 2023-10-07 DIAGNOSIS — D631 Anemia in chronic kidney disease: Secondary | ICD-10-CM | POA: Diagnosis not present

## 2023-10-07 DIAGNOSIS — N186 End stage renal disease: Secondary | ICD-10-CM | POA: Diagnosis not present

## 2023-10-08 DIAGNOSIS — Z992 Dependence on renal dialysis: Secondary | ICD-10-CM | POA: Diagnosis not present

## 2023-10-08 DIAGNOSIS — N2581 Secondary hyperparathyroidism of renal origin: Secondary | ICD-10-CM | POA: Diagnosis not present

## 2023-10-08 DIAGNOSIS — K659 Peritonitis, unspecified: Secondary | ICD-10-CM | POA: Diagnosis not present

## 2023-10-08 DIAGNOSIS — N186 End stage renal disease: Secondary | ICD-10-CM | POA: Diagnosis not present

## 2023-10-08 DIAGNOSIS — Z4932 Encounter for adequacy testing for peritoneal dialysis: Secondary | ICD-10-CM | POA: Diagnosis not present

## 2023-10-08 DIAGNOSIS — D631 Anemia in chronic kidney disease: Secondary | ICD-10-CM | POA: Diagnosis not present

## 2023-10-09 DIAGNOSIS — N2581 Secondary hyperparathyroidism of renal origin: Secondary | ICD-10-CM | POA: Diagnosis not present

## 2023-10-09 DIAGNOSIS — N186 End stage renal disease: Secondary | ICD-10-CM | POA: Diagnosis not present

## 2023-10-09 DIAGNOSIS — Z4932 Encounter for adequacy testing for peritoneal dialysis: Secondary | ICD-10-CM | POA: Diagnosis not present

## 2023-10-09 DIAGNOSIS — Z992 Dependence on renal dialysis: Secondary | ICD-10-CM | POA: Diagnosis not present

## 2023-10-09 DIAGNOSIS — D631 Anemia in chronic kidney disease: Secondary | ICD-10-CM | POA: Diagnosis not present

## 2023-10-09 DIAGNOSIS — K659 Peritonitis, unspecified: Secondary | ICD-10-CM | POA: Diagnosis not present

## 2023-10-10 DIAGNOSIS — K659 Peritonitis, unspecified: Secondary | ICD-10-CM | POA: Diagnosis not present

## 2023-10-10 DIAGNOSIS — Z4932 Encounter for adequacy testing for peritoneal dialysis: Secondary | ICD-10-CM | POA: Diagnosis not present

## 2023-10-10 DIAGNOSIS — N186 End stage renal disease: Secondary | ICD-10-CM | POA: Diagnosis not present

## 2023-10-10 DIAGNOSIS — D631 Anemia in chronic kidney disease: Secondary | ICD-10-CM | POA: Diagnosis not present

## 2023-10-10 DIAGNOSIS — N2581 Secondary hyperparathyroidism of renal origin: Secondary | ICD-10-CM | POA: Diagnosis not present

## 2023-10-10 DIAGNOSIS — Z992 Dependence on renal dialysis: Secondary | ICD-10-CM | POA: Diagnosis not present

## 2023-10-11 DIAGNOSIS — I10 Essential (primary) hypertension: Secondary | ICD-10-CM | POA: Diagnosis not present

## 2023-10-11 DIAGNOSIS — D631 Anemia in chronic kidney disease: Secondary | ICD-10-CM | POA: Diagnosis not present

## 2023-10-11 DIAGNOSIS — N186 End stage renal disease: Secondary | ICD-10-CM | POA: Diagnosis not present

## 2023-10-11 DIAGNOSIS — K659 Peritonitis, unspecified: Secondary | ICD-10-CM | POA: Diagnosis not present

## 2023-10-11 DIAGNOSIS — N2581 Secondary hyperparathyroidism of renal origin: Secondary | ICD-10-CM | POA: Diagnosis not present

## 2023-10-11 DIAGNOSIS — I371 Nonrheumatic pulmonary valve insufficiency: Secondary | ICD-10-CM | POA: Diagnosis not present

## 2023-10-11 DIAGNOSIS — Z79899 Other long term (current) drug therapy: Secondary | ICD-10-CM | POA: Diagnosis not present

## 2023-10-11 DIAGNOSIS — Z992 Dependence on renal dialysis: Secondary | ICD-10-CM | POA: Diagnosis not present

## 2023-10-11 DIAGNOSIS — Z4932 Encounter for adequacy testing for peritoneal dialysis: Secondary | ICD-10-CM | POA: Diagnosis not present

## 2023-10-12 DIAGNOSIS — D631 Anemia in chronic kidney disease: Secondary | ICD-10-CM | POA: Diagnosis not present

## 2023-10-12 DIAGNOSIS — N186 End stage renal disease: Secondary | ICD-10-CM | POA: Diagnosis not present

## 2023-10-12 DIAGNOSIS — N2581 Secondary hyperparathyroidism of renal origin: Secondary | ICD-10-CM | POA: Diagnosis not present

## 2023-10-12 DIAGNOSIS — Z992 Dependence on renal dialysis: Secondary | ICD-10-CM | POA: Diagnosis not present

## 2023-10-12 DIAGNOSIS — Z4932 Encounter for adequacy testing for peritoneal dialysis: Secondary | ICD-10-CM | POA: Diagnosis not present

## 2023-10-12 DIAGNOSIS — K659 Peritonitis, unspecified: Secondary | ICD-10-CM | POA: Diagnosis not present

## 2023-10-13 ENCOUNTER — Encounter: Payer: Self-pay | Admitting: Cardiology

## 2023-10-13 DIAGNOSIS — Z4932 Encounter for adequacy testing for peritoneal dialysis: Secondary | ICD-10-CM | POA: Diagnosis not present

## 2023-10-13 DIAGNOSIS — Z992 Dependence on renal dialysis: Secondary | ICD-10-CM | POA: Diagnosis not present

## 2023-10-13 DIAGNOSIS — D631 Anemia in chronic kidney disease: Secondary | ICD-10-CM | POA: Diagnosis not present

## 2023-10-13 DIAGNOSIS — N186 End stage renal disease: Secondary | ICD-10-CM | POA: Diagnosis not present

## 2023-10-13 DIAGNOSIS — N2581 Secondary hyperparathyroidism of renal origin: Secondary | ICD-10-CM | POA: Diagnosis not present

## 2023-10-13 DIAGNOSIS — K659 Peritonitis, unspecified: Secondary | ICD-10-CM | POA: Diagnosis not present

## 2023-10-14 DIAGNOSIS — Z992 Dependence on renal dialysis: Secondary | ICD-10-CM | POA: Diagnosis not present

## 2023-10-14 DIAGNOSIS — N2581 Secondary hyperparathyroidism of renal origin: Secondary | ICD-10-CM | POA: Diagnosis not present

## 2023-10-14 DIAGNOSIS — D631 Anemia in chronic kidney disease: Secondary | ICD-10-CM | POA: Diagnosis not present

## 2023-10-14 DIAGNOSIS — K659 Peritonitis, unspecified: Secondary | ICD-10-CM | POA: Diagnosis not present

## 2023-10-14 DIAGNOSIS — Z4932 Encounter for adequacy testing for peritoneal dialysis: Secondary | ICD-10-CM | POA: Diagnosis not present

## 2023-10-14 DIAGNOSIS — N186 End stage renal disease: Secondary | ICD-10-CM | POA: Diagnosis not present

## 2023-10-14 DIAGNOSIS — E1122 Type 2 diabetes mellitus with diabetic chronic kidney disease: Secondary | ICD-10-CM | POA: Diagnosis not present

## 2023-10-15 DIAGNOSIS — Z992 Dependence on renal dialysis: Secondary | ICD-10-CM | POA: Diagnosis not present

## 2023-10-15 DIAGNOSIS — K659 Peritonitis, unspecified: Secondary | ICD-10-CM | POA: Diagnosis not present

## 2023-10-15 DIAGNOSIS — D631 Anemia in chronic kidney disease: Secondary | ICD-10-CM | POA: Diagnosis not present

## 2023-10-15 DIAGNOSIS — Z5181 Encounter for therapeutic drug level monitoring: Secondary | ICD-10-CM | POA: Diagnosis not present

## 2023-10-15 DIAGNOSIS — N2581 Secondary hyperparathyroidism of renal origin: Secondary | ICD-10-CM | POA: Diagnosis not present

## 2023-10-15 DIAGNOSIS — N186 End stage renal disease: Secondary | ICD-10-CM | POA: Diagnosis not present

## 2023-10-16 ENCOUNTER — Other Ambulatory Visit (HOSPITAL_BASED_OUTPATIENT_CLINIC_OR_DEPARTMENT_OTHER): Payer: Self-pay

## 2023-10-16 DIAGNOSIS — K659 Peritonitis, unspecified: Secondary | ICD-10-CM | POA: Diagnosis not present

## 2023-10-16 DIAGNOSIS — Z5181 Encounter for therapeutic drug level monitoring: Secondary | ICD-10-CM | POA: Diagnosis not present

## 2023-10-16 DIAGNOSIS — Z992 Dependence on renal dialysis: Secondary | ICD-10-CM | POA: Diagnosis not present

## 2023-10-16 DIAGNOSIS — D631 Anemia in chronic kidney disease: Secondary | ICD-10-CM | POA: Diagnosis not present

## 2023-10-16 DIAGNOSIS — K080 Exfoliation of teeth due to systemic causes: Secondary | ICD-10-CM | POA: Diagnosis not present

## 2023-10-16 DIAGNOSIS — N186 End stage renal disease: Secondary | ICD-10-CM | POA: Diagnosis not present

## 2023-10-16 DIAGNOSIS — N2581 Secondary hyperparathyroidism of renal origin: Secondary | ICD-10-CM | POA: Diagnosis not present

## 2023-10-17 DIAGNOSIS — D631 Anemia in chronic kidney disease: Secondary | ICD-10-CM | POA: Diagnosis not present

## 2023-10-17 DIAGNOSIS — Z5181 Encounter for therapeutic drug level monitoring: Secondary | ICD-10-CM | POA: Diagnosis not present

## 2023-10-17 DIAGNOSIS — Z992 Dependence on renal dialysis: Secondary | ICD-10-CM | POA: Diagnosis not present

## 2023-10-17 DIAGNOSIS — N2581 Secondary hyperparathyroidism of renal origin: Secondary | ICD-10-CM | POA: Diagnosis not present

## 2023-10-17 DIAGNOSIS — N186 End stage renal disease: Secondary | ICD-10-CM | POA: Diagnosis not present

## 2023-10-17 DIAGNOSIS — K659 Peritonitis, unspecified: Secondary | ICD-10-CM | POA: Diagnosis not present

## 2023-10-18 DIAGNOSIS — Z992 Dependence on renal dialysis: Secondary | ICD-10-CM | POA: Diagnosis not present

## 2023-10-18 DIAGNOSIS — Z5181 Encounter for therapeutic drug level monitoring: Secondary | ICD-10-CM | POA: Diagnosis not present

## 2023-10-18 DIAGNOSIS — D631 Anemia in chronic kidney disease: Secondary | ICD-10-CM | POA: Diagnosis not present

## 2023-10-18 DIAGNOSIS — N2581 Secondary hyperparathyroidism of renal origin: Secondary | ICD-10-CM | POA: Diagnosis not present

## 2023-10-18 DIAGNOSIS — N186 End stage renal disease: Secondary | ICD-10-CM | POA: Diagnosis not present

## 2023-10-18 DIAGNOSIS — K659 Peritonitis, unspecified: Secondary | ICD-10-CM | POA: Diagnosis not present

## 2023-10-19 DIAGNOSIS — Z5181 Encounter for therapeutic drug level monitoring: Secondary | ICD-10-CM | POA: Diagnosis not present

## 2023-10-19 DIAGNOSIS — K659 Peritonitis, unspecified: Secondary | ICD-10-CM | POA: Diagnosis not present

## 2023-10-19 DIAGNOSIS — Z992 Dependence on renal dialysis: Secondary | ICD-10-CM | POA: Diagnosis not present

## 2023-10-19 DIAGNOSIS — D631 Anemia in chronic kidney disease: Secondary | ICD-10-CM | POA: Diagnosis not present

## 2023-10-19 DIAGNOSIS — N186 End stage renal disease: Secondary | ICD-10-CM | POA: Diagnosis not present

## 2023-10-19 DIAGNOSIS — N2581 Secondary hyperparathyroidism of renal origin: Secondary | ICD-10-CM | POA: Diagnosis not present

## 2023-10-20 DIAGNOSIS — D631 Anemia in chronic kidney disease: Secondary | ICD-10-CM | POA: Diagnosis not present

## 2023-10-20 DIAGNOSIS — Z992 Dependence on renal dialysis: Secondary | ICD-10-CM | POA: Diagnosis not present

## 2023-10-20 DIAGNOSIS — K659 Peritonitis, unspecified: Secondary | ICD-10-CM | POA: Diagnosis not present

## 2023-10-20 DIAGNOSIS — Z5181 Encounter for therapeutic drug level monitoring: Secondary | ICD-10-CM | POA: Diagnosis not present

## 2023-10-20 DIAGNOSIS — N2581 Secondary hyperparathyroidism of renal origin: Secondary | ICD-10-CM | POA: Diagnosis not present

## 2023-10-20 DIAGNOSIS — N186 End stage renal disease: Secondary | ICD-10-CM | POA: Diagnosis not present

## 2023-10-21 DIAGNOSIS — Z992 Dependence on renal dialysis: Secondary | ICD-10-CM | POA: Diagnosis not present

## 2023-10-21 DIAGNOSIS — K659 Peritonitis, unspecified: Secondary | ICD-10-CM | POA: Diagnosis not present

## 2023-10-21 DIAGNOSIS — D631 Anemia in chronic kidney disease: Secondary | ICD-10-CM | POA: Diagnosis not present

## 2023-10-21 DIAGNOSIS — Z5181 Encounter for therapeutic drug level monitoring: Secondary | ICD-10-CM | POA: Diagnosis not present

## 2023-10-21 DIAGNOSIS — N2581 Secondary hyperparathyroidism of renal origin: Secondary | ICD-10-CM | POA: Diagnosis not present

## 2023-10-21 DIAGNOSIS — N186 End stage renal disease: Secondary | ICD-10-CM | POA: Diagnosis not present

## 2023-10-22 DIAGNOSIS — Z992 Dependence on renal dialysis: Secondary | ICD-10-CM | POA: Diagnosis not present

## 2023-10-22 DIAGNOSIS — Z5181 Encounter for therapeutic drug level monitoring: Secondary | ICD-10-CM | POA: Diagnosis not present

## 2023-10-22 DIAGNOSIS — K659 Peritonitis, unspecified: Secondary | ICD-10-CM | POA: Diagnosis not present

## 2023-10-22 DIAGNOSIS — N186 End stage renal disease: Secondary | ICD-10-CM | POA: Diagnosis not present

## 2023-10-22 DIAGNOSIS — D631 Anemia in chronic kidney disease: Secondary | ICD-10-CM | POA: Diagnosis not present

## 2023-10-22 DIAGNOSIS — N2581 Secondary hyperparathyroidism of renal origin: Secondary | ICD-10-CM | POA: Diagnosis not present

## 2023-10-23 DIAGNOSIS — Z5181 Encounter for therapeutic drug level monitoring: Secondary | ICD-10-CM | POA: Diagnosis not present

## 2023-10-23 DIAGNOSIS — D631 Anemia in chronic kidney disease: Secondary | ICD-10-CM | POA: Diagnosis not present

## 2023-10-23 DIAGNOSIS — Z992 Dependence on renal dialysis: Secondary | ICD-10-CM | POA: Diagnosis not present

## 2023-10-23 DIAGNOSIS — N2581 Secondary hyperparathyroidism of renal origin: Secondary | ICD-10-CM | POA: Diagnosis not present

## 2023-10-23 DIAGNOSIS — N186 End stage renal disease: Secondary | ICD-10-CM | POA: Diagnosis not present

## 2023-10-23 DIAGNOSIS — K659 Peritonitis, unspecified: Secondary | ICD-10-CM | POA: Diagnosis not present

## 2023-10-24 ENCOUNTER — Encounter (HOSPITAL_BASED_OUTPATIENT_CLINIC_OR_DEPARTMENT_OTHER): Payer: Self-pay

## 2023-10-24 ENCOUNTER — Other Ambulatory Visit (HOSPITAL_BASED_OUTPATIENT_CLINIC_OR_DEPARTMENT_OTHER): Payer: Self-pay

## 2023-10-24 DIAGNOSIS — D631 Anemia in chronic kidney disease: Secondary | ICD-10-CM | POA: Diagnosis not present

## 2023-10-24 DIAGNOSIS — N2581 Secondary hyperparathyroidism of renal origin: Secondary | ICD-10-CM | POA: Diagnosis not present

## 2023-10-24 DIAGNOSIS — Z992 Dependence on renal dialysis: Secondary | ICD-10-CM | POA: Diagnosis not present

## 2023-10-24 DIAGNOSIS — Z5181 Encounter for therapeutic drug level monitoring: Secondary | ICD-10-CM | POA: Diagnosis not present

## 2023-10-24 DIAGNOSIS — K659 Peritonitis, unspecified: Secondary | ICD-10-CM | POA: Diagnosis not present

## 2023-10-24 DIAGNOSIS — N186 End stage renal disease: Secondary | ICD-10-CM | POA: Diagnosis not present

## 2023-10-25 ENCOUNTER — Other Ambulatory Visit (HOSPITAL_BASED_OUTPATIENT_CLINIC_OR_DEPARTMENT_OTHER): Payer: Self-pay

## 2023-10-25 DIAGNOSIS — K659 Peritonitis, unspecified: Secondary | ICD-10-CM | POA: Diagnosis not present

## 2023-10-25 DIAGNOSIS — N2581 Secondary hyperparathyroidism of renal origin: Secondary | ICD-10-CM | POA: Diagnosis not present

## 2023-10-25 DIAGNOSIS — Z992 Dependence on renal dialysis: Secondary | ICD-10-CM | POA: Diagnosis not present

## 2023-10-25 DIAGNOSIS — Z5181 Encounter for therapeutic drug level monitoring: Secondary | ICD-10-CM | POA: Diagnosis not present

## 2023-10-25 DIAGNOSIS — D631 Anemia in chronic kidney disease: Secondary | ICD-10-CM | POA: Diagnosis not present

## 2023-10-25 DIAGNOSIS — N186 End stage renal disease: Secondary | ICD-10-CM | POA: Diagnosis not present

## 2023-10-25 MED ORDER — RIFAMPIN 300 MG PO CAPS
ORAL_CAPSULE | ORAL | 0 refills | Status: AC
  Filled 2023-10-25: qty 14, 7d supply, fill #0

## 2023-10-26 DIAGNOSIS — Z992 Dependence on renal dialysis: Secondary | ICD-10-CM | POA: Diagnosis not present

## 2023-10-26 DIAGNOSIS — D631 Anemia in chronic kidney disease: Secondary | ICD-10-CM | POA: Diagnosis not present

## 2023-10-26 DIAGNOSIS — N186 End stage renal disease: Secondary | ICD-10-CM | POA: Diagnosis not present

## 2023-10-26 DIAGNOSIS — K659 Peritonitis, unspecified: Secondary | ICD-10-CM | POA: Diagnosis not present

## 2023-10-26 DIAGNOSIS — Z5181 Encounter for therapeutic drug level monitoring: Secondary | ICD-10-CM | POA: Diagnosis not present

## 2023-10-26 DIAGNOSIS — N2581 Secondary hyperparathyroidism of renal origin: Secondary | ICD-10-CM | POA: Diagnosis not present

## 2023-10-27 DIAGNOSIS — K659 Peritonitis, unspecified: Secondary | ICD-10-CM | POA: Diagnosis not present

## 2023-10-27 DIAGNOSIS — N2581 Secondary hyperparathyroidism of renal origin: Secondary | ICD-10-CM | POA: Diagnosis not present

## 2023-10-27 DIAGNOSIS — Z5181 Encounter for therapeutic drug level monitoring: Secondary | ICD-10-CM | POA: Diagnosis not present

## 2023-10-27 DIAGNOSIS — D631 Anemia in chronic kidney disease: Secondary | ICD-10-CM | POA: Diagnosis not present

## 2023-10-27 DIAGNOSIS — N186 End stage renal disease: Secondary | ICD-10-CM | POA: Diagnosis not present

## 2023-10-27 DIAGNOSIS — Z992 Dependence on renal dialysis: Secondary | ICD-10-CM | POA: Diagnosis not present

## 2023-10-28 DIAGNOSIS — D631 Anemia in chronic kidney disease: Secondary | ICD-10-CM | POA: Diagnosis not present

## 2023-10-28 DIAGNOSIS — N2581 Secondary hyperparathyroidism of renal origin: Secondary | ICD-10-CM | POA: Diagnosis not present

## 2023-10-28 DIAGNOSIS — Z992 Dependence on renal dialysis: Secondary | ICD-10-CM | POA: Diagnosis not present

## 2023-10-28 DIAGNOSIS — Z5181 Encounter for therapeutic drug level monitoring: Secondary | ICD-10-CM | POA: Diagnosis not present

## 2023-10-28 DIAGNOSIS — N186 End stage renal disease: Secondary | ICD-10-CM | POA: Diagnosis not present

## 2023-10-28 DIAGNOSIS — K659 Peritonitis, unspecified: Secondary | ICD-10-CM | POA: Diagnosis not present

## 2023-10-29 DIAGNOSIS — Z992 Dependence on renal dialysis: Secondary | ICD-10-CM | POA: Diagnosis not present

## 2023-10-29 DIAGNOSIS — D631 Anemia in chronic kidney disease: Secondary | ICD-10-CM | POA: Diagnosis not present

## 2023-10-29 DIAGNOSIS — N2581 Secondary hyperparathyroidism of renal origin: Secondary | ICD-10-CM | POA: Diagnosis not present

## 2023-10-29 DIAGNOSIS — Z5181 Encounter for therapeutic drug level monitoring: Secondary | ICD-10-CM | POA: Diagnosis not present

## 2023-10-29 DIAGNOSIS — N186 End stage renal disease: Secondary | ICD-10-CM | POA: Diagnosis not present

## 2023-10-29 DIAGNOSIS — K659 Peritonitis, unspecified: Secondary | ICD-10-CM | POA: Diagnosis not present

## 2023-10-30 DIAGNOSIS — N186 End stage renal disease: Secondary | ICD-10-CM | POA: Diagnosis not present

## 2023-10-30 DIAGNOSIS — N2581 Secondary hyperparathyroidism of renal origin: Secondary | ICD-10-CM | POA: Diagnosis not present

## 2023-10-30 DIAGNOSIS — Z5181 Encounter for therapeutic drug level monitoring: Secondary | ICD-10-CM | POA: Diagnosis not present

## 2023-10-30 DIAGNOSIS — K659 Peritonitis, unspecified: Secondary | ICD-10-CM | POA: Diagnosis not present

## 2023-10-30 DIAGNOSIS — D631 Anemia in chronic kidney disease: Secondary | ICD-10-CM | POA: Diagnosis not present

## 2023-10-30 DIAGNOSIS — Z992 Dependence on renal dialysis: Secondary | ICD-10-CM | POA: Diagnosis not present

## 2023-10-31 DIAGNOSIS — D631 Anemia in chronic kidney disease: Secondary | ICD-10-CM | POA: Diagnosis not present

## 2023-10-31 DIAGNOSIS — K659 Peritonitis, unspecified: Secondary | ICD-10-CM | POA: Diagnosis not present

## 2023-10-31 DIAGNOSIS — Z992 Dependence on renal dialysis: Secondary | ICD-10-CM | POA: Diagnosis not present

## 2023-10-31 DIAGNOSIS — N2581 Secondary hyperparathyroidism of renal origin: Secondary | ICD-10-CM | POA: Diagnosis not present

## 2023-10-31 DIAGNOSIS — Z5181 Encounter for therapeutic drug level monitoring: Secondary | ICD-10-CM | POA: Diagnosis not present

## 2023-10-31 DIAGNOSIS — N186 End stage renal disease: Secondary | ICD-10-CM | POA: Diagnosis not present

## 2023-11-01 DIAGNOSIS — Z992 Dependence on renal dialysis: Secondary | ICD-10-CM | POA: Diagnosis not present

## 2023-11-01 DIAGNOSIS — I10 Essential (primary) hypertension: Secondary | ICD-10-CM | POA: Diagnosis not present

## 2023-11-01 DIAGNOSIS — D631 Anemia in chronic kidney disease: Secondary | ICD-10-CM | POA: Diagnosis not present

## 2023-11-01 DIAGNOSIS — G4733 Obstructive sleep apnea (adult) (pediatric): Secondary | ICD-10-CM | POA: Diagnosis not present

## 2023-11-01 DIAGNOSIS — N2581 Secondary hyperparathyroidism of renal origin: Secondary | ICD-10-CM | POA: Diagnosis not present

## 2023-11-01 DIAGNOSIS — Z5181 Encounter for therapeutic drug level monitoring: Secondary | ICD-10-CM | POA: Diagnosis not present

## 2023-11-01 DIAGNOSIS — N186 End stage renal disease: Secondary | ICD-10-CM | POA: Diagnosis not present

## 2023-11-01 DIAGNOSIS — K659 Peritonitis, unspecified: Secondary | ICD-10-CM | POA: Diagnosis not present

## 2023-11-02 DIAGNOSIS — D631 Anemia in chronic kidney disease: Secondary | ICD-10-CM | POA: Diagnosis not present

## 2023-11-02 DIAGNOSIS — K659 Peritonitis, unspecified: Secondary | ICD-10-CM | POA: Diagnosis not present

## 2023-11-02 DIAGNOSIS — N2581 Secondary hyperparathyroidism of renal origin: Secondary | ICD-10-CM | POA: Diagnosis not present

## 2023-11-02 DIAGNOSIS — Z992 Dependence on renal dialysis: Secondary | ICD-10-CM | POA: Diagnosis not present

## 2023-11-02 DIAGNOSIS — Z5181 Encounter for therapeutic drug level monitoring: Secondary | ICD-10-CM | POA: Diagnosis not present

## 2023-11-02 DIAGNOSIS — N186 End stage renal disease: Secondary | ICD-10-CM | POA: Diagnosis not present

## 2023-11-03 DIAGNOSIS — Z992 Dependence on renal dialysis: Secondary | ICD-10-CM | POA: Diagnosis not present

## 2023-11-03 DIAGNOSIS — D631 Anemia in chronic kidney disease: Secondary | ICD-10-CM | POA: Diagnosis not present

## 2023-11-03 DIAGNOSIS — Z5181 Encounter for therapeutic drug level monitoring: Secondary | ICD-10-CM | POA: Diagnosis not present

## 2023-11-03 DIAGNOSIS — K659 Peritonitis, unspecified: Secondary | ICD-10-CM | POA: Diagnosis not present

## 2023-11-03 DIAGNOSIS — N2581 Secondary hyperparathyroidism of renal origin: Secondary | ICD-10-CM | POA: Diagnosis not present

## 2023-11-03 DIAGNOSIS — N186 End stage renal disease: Secondary | ICD-10-CM | POA: Diagnosis not present

## 2023-11-04 DIAGNOSIS — D631 Anemia in chronic kidney disease: Secondary | ICD-10-CM | POA: Diagnosis not present

## 2023-11-04 DIAGNOSIS — Z5181 Encounter for therapeutic drug level monitoring: Secondary | ICD-10-CM | POA: Diagnosis not present

## 2023-11-04 DIAGNOSIS — N2581 Secondary hyperparathyroidism of renal origin: Secondary | ICD-10-CM | POA: Diagnosis not present

## 2023-11-04 DIAGNOSIS — Z992 Dependence on renal dialysis: Secondary | ICD-10-CM | POA: Diagnosis not present

## 2023-11-04 DIAGNOSIS — K659 Peritonitis, unspecified: Secondary | ICD-10-CM | POA: Diagnosis not present

## 2023-11-04 DIAGNOSIS — N186 End stage renal disease: Secondary | ICD-10-CM | POA: Diagnosis not present

## 2023-11-05 DIAGNOSIS — N2581 Secondary hyperparathyroidism of renal origin: Secondary | ICD-10-CM | POA: Diagnosis not present

## 2023-11-05 DIAGNOSIS — K659 Peritonitis, unspecified: Secondary | ICD-10-CM | POA: Diagnosis not present

## 2023-11-05 DIAGNOSIS — Z5181 Encounter for therapeutic drug level monitoring: Secondary | ICD-10-CM | POA: Diagnosis not present

## 2023-11-05 DIAGNOSIS — N186 End stage renal disease: Secondary | ICD-10-CM | POA: Diagnosis not present

## 2023-11-05 DIAGNOSIS — Z992 Dependence on renal dialysis: Secondary | ICD-10-CM | POA: Diagnosis not present

## 2023-11-05 DIAGNOSIS — D631 Anemia in chronic kidney disease: Secondary | ICD-10-CM | POA: Diagnosis not present

## 2023-11-06 DIAGNOSIS — N2581 Secondary hyperparathyroidism of renal origin: Secondary | ICD-10-CM | POA: Diagnosis not present

## 2023-11-06 DIAGNOSIS — Z6841 Body Mass Index (BMI) 40.0 and over, adult: Secondary | ICD-10-CM | POA: Diagnosis not present

## 2023-11-06 DIAGNOSIS — Z992 Dependence on renal dialysis: Secondary | ICD-10-CM | POA: Diagnosis not present

## 2023-11-06 DIAGNOSIS — E7800 Pure hypercholesterolemia, unspecified: Secondary | ICD-10-CM | POA: Diagnosis not present

## 2023-11-06 DIAGNOSIS — Z5181 Encounter for therapeutic drug level monitoring: Secondary | ICD-10-CM | POA: Diagnosis not present

## 2023-11-06 DIAGNOSIS — N186 End stage renal disease: Secondary | ICD-10-CM | POA: Diagnosis not present

## 2023-11-06 DIAGNOSIS — I10 Essential (primary) hypertension: Secondary | ICD-10-CM | POA: Diagnosis not present

## 2023-11-06 DIAGNOSIS — D631 Anemia in chronic kidney disease: Secondary | ICD-10-CM | POA: Diagnosis not present

## 2023-11-06 DIAGNOSIS — E1122 Type 2 diabetes mellitus with diabetic chronic kidney disease: Secondary | ICD-10-CM | POA: Diagnosis not present

## 2023-11-06 DIAGNOSIS — K760 Fatty (change of) liver, not elsewhere classified: Secondary | ICD-10-CM | POA: Diagnosis not present

## 2023-11-06 DIAGNOSIS — K659 Peritonitis, unspecified: Secondary | ICD-10-CM | POA: Diagnosis not present

## 2023-11-07 DIAGNOSIS — Z5181 Encounter for therapeutic drug level monitoring: Secondary | ICD-10-CM | POA: Diagnosis not present

## 2023-11-07 DIAGNOSIS — N186 End stage renal disease: Secondary | ICD-10-CM | POA: Diagnosis not present

## 2023-11-07 DIAGNOSIS — N2581 Secondary hyperparathyroidism of renal origin: Secondary | ICD-10-CM | POA: Diagnosis not present

## 2023-11-07 DIAGNOSIS — K659 Peritonitis, unspecified: Secondary | ICD-10-CM | POA: Diagnosis not present

## 2023-11-07 DIAGNOSIS — D631 Anemia in chronic kidney disease: Secondary | ICD-10-CM | POA: Diagnosis not present

## 2023-11-07 DIAGNOSIS — Z992 Dependence on renal dialysis: Secondary | ICD-10-CM | POA: Diagnosis not present

## 2023-11-08 DIAGNOSIS — N186 End stage renal disease: Secondary | ICD-10-CM | POA: Diagnosis not present

## 2023-11-08 DIAGNOSIS — Z992 Dependence on renal dialysis: Secondary | ICD-10-CM | POA: Diagnosis not present

## 2023-11-08 DIAGNOSIS — K659 Peritonitis, unspecified: Secondary | ICD-10-CM | POA: Diagnosis not present

## 2023-11-08 DIAGNOSIS — N2581 Secondary hyperparathyroidism of renal origin: Secondary | ICD-10-CM | POA: Diagnosis not present

## 2023-11-08 DIAGNOSIS — Z5181 Encounter for therapeutic drug level monitoring: Secondary | ICD-10-CM | POA: Diagnosis not present

## 2023-11-08 DIAGNOSIS — D631 Anemia in chronic kidney disease: Secondary | ICD-10-CM | POA: Diagnosis not present

## 2023-11-09 DIAGNOSIS — Z5181 Encounter for therapeutic drug level monitoring: Secondary | ICD-10-CM | POA: Diagnosis not present

## 2023-11-09 DIAGNOSIS — Z992 Dependence on renal dialysis: Secondary | ICD-10-CM | POA: Diagnosis not present

## 2023-11-09 DIAGNOSIS — N186 End stage renal disease: Secondary | ICD-10-CM | POA: Diagnosis not present

## 2023-11-09 DIAGNOSIS — N2581 Secondary hyperparathyroidism of renal origin: Secondary | ICD-10-CM | POA: Diagnosis not present

## 2023-11-09 DIAGNOSIS — D631 Anemia in chronic kidney disease: Secondary | ICD-10-CM | POA: Diagnosis not present

## 2023-11-09 DIAGNOSIS — K659 Peritonitis, unspecified: Secondary | ICD-10-CM | POA: Diagnosis not present

## 2023-11-10 ENCOUNTER — Other Ambulatory Visit (HOSPITAL_BASED_OUTPATIENT_CLINIC_OR_DEPARTMENT_OTHER): Payer: Self-pay

## 2023-11-10 DIAGNOSIS — K659 Peritonitis, unspecified: Secondary | ICD-10-CM | POA: Diagnosis not present

## 2023-11-10 DIAGNOSIS — Z5181 Encounter for therapeutic drug level monitoring: Secondary | ICD-10-CM | POA: Diagnosis not present

## 2023-11-10 DIAGNOSIS — N2581 Secondary hyperparathyroidism of renal origin: Secondary | ICD-10-CM | POA: Diagnosis not present

## 2023-11-10 DIAGNOSIS — N186 End stage renal disease: Secondary | ICD-10-CM | POA: Diagnosis not present

## 2023-11-10 DIAGNOSIS — D631 Anemia in chronic kidney disease: Secondary | ICD-10-CM | POA: Diagnosis not present

## 2023-11-10 DIAGNOSIS — Z992 Dependence on renal dialysis: Secondary | ICD-10-CM | POA: Diagnosis not present

## 2023-11-11 DIAGNOSIS — K659 Peritonitis, unspecified: Secondary | ICD-10-CM | POA: Diagnosis not present

## 2023-11-11 DIAGNOSIS — N186 End stage renal disease: Secondary | ICD-10-CM | POA: Diagnosis not present

## 2023-11-11 DIAGNOSIS — D631 Anemia in chronic kidney disease: Secondary | ICD-10-CM | POA: Diagnosis not present

## 2023-11-11 DIAGNOSIS — Z5181 Encounter for therapeutic drug level monitoring: Secondary | ICD-10-CM | POA: Diagnosis not present

## 2023-11-11 DIAGNOSIS — N2581 Secondary hyperparathyroidism of renal origin: Secondary | ICD-10-CM | POA: Diagnosis not present

## 2023-11-11 DIAGNOSIS — Z992 Dependence on renal dialysis: Secondary | ICD-10-CM | POA: Diagnosis not present

## 2023-11-12 DIAGNOSIS — N186 End stage renal disease: Secondary | ICD-10-CM | POA: Diagnosis not present

## 2023-11-12 DIAGNOSIS — Z992 Dependence on renal dialysis: Secondary | ICD-10-CM | POA: Diagnosis not present

## 2023-11-12 DIAGNOSIS — D631 Anemia in chronic kidney disease: Secondary | ICD-10-CM | POA: Diagnosis not present

## 2023-11-12 DIAGNOSIS — N2581 Secondary hyperparathyroidism of renal origin: Secondary | ICD-10-CM | POA: Diagnosis not present

## 2023-11-12 DIAGNOSIS — Z5181 Encounter for therapeutic drug level monitoring: Secondary | ICD-10-CM | POA: Diagnosis not present

## 2023-11-12 DIAGNOSIS — K659 Peritonitis, unspecified: Secondary | ICD-10-CM | POA: Diagnosis not present

## 2023-11-13 ENCOUNTER — Other Ambulatory Visit (HOSPITAL_BASED_OUTPATIENT_CLINIC_OR_DEPARTMENT_OTHER): Payer: Self-pay

## 2023-11-13 DIAGNOSIS — Z5181 Encounter for therapeutic drug level monitoring: Secondary | ICD-10-CM | POA: Diagnosis not present

## 2023-11-13 DIAGNOSIS — Z992 Dependence on renal dialysis: Secondary | ICD-10-CM | POA: Diagnosis not present

## 2023-11-13 DIAGNOSIS — N2581 Secondary hyperparathyroidism of renal origin: Secondary | ICD-10-CM | POA: Diagnosis not present

## 2023-11-13 DIAGNOSIS — K659 Peritonitis, unspecified: Secondary | ICD-10-CM | POA: Diagnosis not present

## 2023-11-13 DIAGNOSIS — E1122 Type 2 diabetes mellitus with diabetic chronic kidney disease: Secondary | ICD-10-CM | POA: Diagnosis not present

## 2023-11-13 DIAGNOSIS — D631 Anemia in chronic kidney disease: Secondary | ICD-10-CM | POA: Diagnosis not present

## 2023-11-13 DIAGNOSIS — N186 End stage renal disease: Secondary | ICD-10-CM | POA: Diagnosis not present

## 2023-11-14 ENCOUNTER — Other Ambulatory Visit (HOSPITAL_BASED_OUTPATIENT_CLINIC_OR_DEPARTMENT_OTHER): Payer: Self-pay

## 2023-11-14 DIAGNOSIS — N2581 Secondary hyperparathyroidism of renal origin: Secondary | ICD-10-CM | POA: Diagnosis not present

## 2023-11-14 DIAGNOSIS — D631 Anemia in chronic kidney disease: Secondary | ICD-10-CM | POA: Diagnosis not present

## 2023-11-14 DIAGNOSIS — Z4932 Encounter for adequacy testing for peritoneal dialysis: Secondary | ICD-10-CM | POA: Diagnosis not present

## 2023-11-14 DIAGNOSIS — N186 End stage renal disease: Secondary | ICD-10-CM | POA: Diagnosis not present

## 2023-11-14 DIAGNOSIS — Z992 Dependence on renal dialysis: Secondary | ICD-10-CM | POA: Diagnosis not present

## 2023-11-14 DIAGNOSIS — K659 Peritonitis, unspecified: Secondary | ICD-10-CM | POA: Diagnosis not present

## 2023-11-14 MED ORDER — CARVEDILOL 25 MG PO TABS
25.0000 mg | ORAL_TABLET | Freq: Two times a day (BID) | ORAL | 4 refills | Status: AC
  Filled 2023-11-14: qty 180, 90d supply, fill #0
  Filled 2024-02-15: qty 180, 90d supply, fill #1
  Filled 2024-05-10: qty 180, 90d supply, fill #2

## 2023-11-15 DIAGNOSIS — Z4932 Encounter for adequacy testing for peritoneal dialysis: Secondary | ICD-10-CM | POA: Diagnosis not present

## 2023-11-15 DIAGNOSIS — N186 End stage renal disease: Secondary | ICD-10-CM | POA: Diagnosis not present

## 2023-11-15 DIAGNOSIS — Z992 Dependence on renal dialysis: Secondary | ICD-10-CM | POA: Diagnosis not present

## 2023-11-15 DIAGNOSIS — D631 Anemia in chronic kidney disease: Secondary | ICD-10-CM | POA: Diagnosis not present

## 2023-11-15 DIAGNOSIS — K659 Peritonitis, unspecified: Secondary | ICD-10-CM | POA: Diagnosis not present

## 2023-11-15 DIAGNOSIS — N2581 Secondary hyperparathyroidism of renal origin: Secondary | ICD-10-CM | POA: Diagnosis not present

## 2023-11-16 DIAGNOSIS — K659 Peritonitis, unspecified: Secondary | ICD-10-CM | POA: Diagnosis not present

## 2023-11-16 DIAGNOSIS — Z992 Dependence on renal dialysis: Secondary | ICD-10-CM | POA: Diagnosis not present

## 2023-11-16 DIAGNOSIS — D631 Anemia in chronic kidney disease: Secondary | ICD-10-CM | POA: Diagnosis not present

## 2023-11-16 DIAGNOSIS — N2581 Secondary hyperparathyroidism of renal origin: Secondary | ICD-10-CM | POA: Diagnosis not present

## 2023-11-16 DIAGNOSIS — N186 End stage renal disease: Secondary | ICD-10-CM | POA: Diagnosis not present

## 2023-11-16 DIAGNOSIS — Z4932 Encounter for adequacy testing for peritoneal dialysis: Secondary | ICD-10-CM | POA: Diagnosis not present

## 2023-11-17 DIAGNOSIS — K659 Peritonitis, unspecified: Secondary | ICD-10-CM | POA: Diagnosis not present

## 2023-11-17 DIAGNOSIS — Z4932 Encounter for adequacy testing for peritoneal dialysis: Secondary | ICD-10-CM | POA: Diagnosis not present

## 2023-11-17 DIAGNOSIS — N186 End stage renal disease: Secondary | ICD-10-CM | POA: Diagnosis not present

## 2023-11-17 DIAGNOSIS — N2581 Secondary hyperparathyroidism of renal origin: Secondary | ICD-10-CM | POA: Diagnosis not present

## 2023-11-17 DIAGNOSIS — D631 Anemia in chronic kidney disease: Secondary | ICD-10-CM | POA: Diagnosis not present

## 2023-11-17 DIAGNOSIS — Z992 Dependence on renal dialysis: Secondary | ICD-10-CM | POA: Diagnosis not present

## 2023-11-18 DIAGNOSIS — D631 Anemia in chronic kidney disease: Secondary | ICD-10-CM | POA: Diagnosis not present

## 2023-11-18 DIAGNOSIS — Z992 Dependence on renal dialysis: Secondary | ICD-10-CM | POA: Diagnosis not present

## 2023-11-18 DIAGNOSIS — N186 End stage renal disease: Secondary | ICD-10-CM | POA: Diagnosis not present

## 2023-11-18 DIAGNOSIS — K659 Peritonitis, unspecified: Secondary | ICD-10-CM | POA: Diagnosis not present

## 2023-11-18 DIAGNOSIS — Z4932 Encounter for adequacy testing for peritoneal dialysis: Secondary | ICD-10-CM | POA: Diagnosis not present

## 2023-11-18 DIAGNOSIS — N2581 Secondary hyperparathyroidism of renal origin: Secondary | ICD-10-CM | POA: Diagnosis not present

## 2023-11-19 DIAGNOSIS — Z4932 Encounter for adequacy testing for peritoneal dialysis: Secondary | ICD-10-CM | POA: Diagnosis not present

## 2023-11-19 DIAGNOSIS — N186 End stage renal disease: Secondary | ICD-10-CM | POA: Diagnosis not present

## 2023-11-19 DIAGNOSIS — N2581 Secondary hyperparathyroidism of renal origin: Secondary | ICD-10-CM | POA: Diagnosis not present

## 2023-11-19 DIAGNOSIS — D631 Anemia in chronic kidney disease: Secondary | ICD-10-CM | POA: Diagnosis not present

## 2023-11-19 DIAGNOSIS — K659 Peritonitis, unspecified: Secondary | ICD-10-CM | POA: Diagnosis not present

## 2023-11-19 DIAGNOSIS — Z992 Dependence on renal dialysis: Secondary | ICD-10-CM | POA: Diagnosis not present

## 2023-11-20 DIAGNOSIS — D631 Anemia in chronic kidney disease: Secondary | ICD-10-CM | POA: Diagnosis not present

## 2023-11-20 DIAGNOSIS — Z992 Dependence on renal dialysis: Secondary | ICD-10-CM | POA: Diagnosis not present

## 2023-11-20 DIAGNOSIS — N2581 Secondary hyperparathyroidism of renal origin: Secondary | ICD-10-CM | POA: Diagnosis not present

## 2023-11-20 DIAGNOSIS — K659 Peritonitis, unspecified: Secondary | ICD-10-CM | POA: Diagnosis not present

## 2023-11-20 DIAGNOSIS — N186 End stage renal disease: Secondary | ICD-10-CM | POA: Diagnosis not present

## 2023-11-20 DIAGNOSIS — Z4932 Encounter for adequacy testing for peritoneal dialysis: Secondary | ICD-10-CM | POA: Diagnosis not present

## 2023-11-21 DIAGNOSIS — F4312 Post-traumatic stress disorder, chronic: Secondary | ICD-10-CM | POA: Diagnosis not present

## 2023-11-21 DIAGNOSIS — Z4932 Encounter for adequacy testing for peritoneal dialysis: Secondary | ICD-10-CM | POA: Diagnosis not present

## 2023-11-21 DIAGNOSIS — N2581 Secondary hyperparathyroidism of renal origin: Secondary | ICD-10-CM | POA: Diagnosis not present

## 2023-11-21 DIAGNOSIS — N186 End stage renal disease: Secondary | ICD-10-CM | POA: Diagnosis not present

## 2023-11-21 DIAGNOSIS — D631 Anemia in chronic kidney disease: Secondary | ICD-10-CM | POA: Diagnosis not present

## 2023-11-21 DIAGNOSIS — Z992 Dependence on renal dialysis: Secondary | ICD-10-CM | POA: Diagnosis not present

## 2023-11-21 DIAGNOSIS — K659 Peritonitis, unspecified: Secondary | ICD-10-CM | POA: Diagnosis not present

## 2023-11-22 DIAGNOSIS — N186 End stage renal disease: Secondary | ICD-10-CM | POA: Diagnosis not present

## 2023-11-22 DIAGNOSIS — K659 Peritonitis, unspecified: Secondary | ICD-10-CM | POA: Diagnosis not present

## 2023-11-22 DIAGNOSIS — Z992 Dependence on renal dialysis: Secondary | ICD-10-CM | POA: Diagnosis not present

## 2023-11-22 DIAGNOSIS — D631 Anemia in chronic kidney disease: Secondary | ICD-10-CM | POA: Diagnosis not present

## 2023-11-22 DIAGNOSIS — Z4932 Encounter for adequacy testing for peritoneal dialysis: Secondary | ICD-10-CM | POA: Diagnosis not present

## 2023-11-22 DIAGNOSIS — N2581 Secondary hyperparathyroidism of renal origin: Secondary | ICD-10-CM | POA: Diagnosis not present

## 2023-11-23 ENCOUNTER — Other Ambulatory Visit (HOSPITAL_BASED_OUTPATIENT_CLINIC_OR_DEPARTMENT_OTHER): Payer: Self-pay

## 2023-11-23 DIAGNOSIS — D631 Anemia in chronic kidney disease: Secondary | ICD-10-CM | POA: Diagnosis not present

## 2023-11-23 DIAGNOSIS — N2581 Secondary hyperparathyroidism of renal origin: Secondary | ICD-10-CM | POA: Diagnosis not present

## 2023-11-23 DIAGNOSIS — K659 Peritonitis, unspecified: Secondary | ICD-10-CM | POA: Diagnosis not present

## 2023-11-23 DIAGNOSIS — N186 End stage renal disease: Secondary | ICD-10-CM | POA: Diagnosis not present

## 2023-11-23 DIAGNOSIS — Z4932 Encounter for adequacy testing for peritoneal dialysis: Secondary | ICD-10-CM | POA: Diagnosis not present

## 2023-11-23 DIAGNOSIS — Z992 Dependence on renal dialysis: Secondary | ICD-10-CM | POA: Diagnosis not present

## 2023-11-24 DIAGNOSIS — D631 Anemia in chronic kidney disease: Secondary | ICD-10-CM | POA: Diagnosis not present

## 2023-11-24 DIAGNOSIS — Z4932 Encounter for adequacy testing for peritoneal dialysis: Secondary | ICD-10-CM | POA: Diagnosis not present

## 2023-11-24 DIAGNOSIS — N2581 Secondary hyperparathyroidism of renal origin: Secondary | ICD-10-CM | POA: Diagnosis not present

## 2023-11-24 DIAGNOSIS — Z992 Dependence on renal dialysis: Secondary | ICD-10-CM | POA: Diagnosis not present

## 2023-11-24 DIAGNOSIS — N186 End stage renal disease: Secondary | ICD-10-CM | POA: Diagnosis not present

## 2023-11-24 DIAGNOSIS — K659 Peritonitis, unspecified: Secondary | ICD-10-CM | POA: Diagnosis not present

## 2023-11-25 DIAGNOSIS — Z4932 Encounter for adequacy testing for peritoneal dialysis: Secondary | ICD-10-CM | POA: Diagnosis not present

## 2023-11-25 DIAGNOSIS — D631 Anemia in chronic kidney disease: Secondary | ICD-10-CM | POA: Diagnosis not present

## 2023-11-25 DIAGNOSIS — N2581 Secondary hyperparathyroidism of renal origin: Secondary | ICD-10-CM | POA: Diagnosis not present

## 2023-11-25 DIAGNOSIS — K659 Peritonitis, unspecified: Secondary | ICD-10-CM | POA: Diagnosis not present

## 2023-11-25 DIAGNOSIS — N186 End stage renal disease: Secondary | ICD-10-CM | POA: Diagnosis not present

## 2023-11-25 DIAGNOSIS — Z992 Dependence on renal dialysis: Secondary | ICD-10-CM | POA: Diagnosis not present

## 2023-11-26 DIAGNOSIS — D631 Anemia in chronic kidney disease: Secondary | ICD-10-CM | POA: Diagnosis not present

## 2023-11-26 DIAGNOSIS — Z992 Dependence on renal dialysis: Secondary | ICD-10-CM | POA: Diagnosis not present

## 2023-11-26 DIAGNOSIS — K659 Peritonitis, unspecified: Secondary | ICD-10-CM | POA: Diagnosis not present

## 2023-11-26 DIAGNOSIS — N2581 Secondary hyperparathyroidism of renal origin: Secondary | ICD-10-CM | POA: Diagnosis not present

## 2023-11-26 DIAGNOSIS — N186 End stage renal disease: Secondary | ICD-10-CM | POA: Diagnosis not present

## 2023-11-26 DIAGNOSIS — Z4932 Encounter for adequacy testing for peritoneal dialysis: Secondary | ICD-10-CM | POA: Diagnosis not present

## 2023-11-27 DIAGNOSIS — Z4932 Encounter for adequacy testing for peritoneal dialysis: Secondary | ICD-10-CM | POA: Diagnosis not present

## 2023-11-27 DIAGNOSIS — N186 End stage renal disease: Secondary | ICD-10-CM | POA: Diagnosis not present

## 2023-11-27 DIAGNOSIS — K659 Peritonitis, unspecified: Secondary | ICD-10-CM | POA: Diagnosis not present

## 2023-11-27 DIAGNOSIS — Z992 Dependence on renal dialysis: Secondary | ICD-10-CM | POA: Diagnosis not present

## 2023-11-27 DIAGNOSIS — N2581 Secondary hyperparathyroidism of renal origin: Secondary | ICD-10-CM | POA: Diagnosis not present

## 2023-11-27 DIAGNOSIS — D631 Anemia in chronic kidney disease: Secondary | ICD-10-CM | POA: Diagnosis not present

## 2023-11-28 DIAGNOSIS — K659 Peritonitis, unspecified: Secondary | ICD-10-CM | POA: Diagnosis not present

## 2023-11-28 DIAGNOSIS — D631 Anemia in chronic kidney disease: Secondary | ICD-10-CM | POA: Diagnosis not present

## 2023-11-28 DIAGNOSIS — N186 End stage renal disease: Secondary | ICD-10-CM | POA: Diagnosis not present

## 2023-11-28 DIAGNOSIS — N2581 Secondary hyperparathyroidism of renal origin: Secondary | ICD-10-CM | POA: Diagnosis not present

## 2023-11-28 DIAGNOSIS — Z4932 Encounter for adequacy testing for peritoneal dialysis: Secondary | ICD-10-CM | POA: Diagnosis not present

## 2023-11-28 DIAGNOSIS — Z992 Dependence on renal dialysis: Secondary | ICD-10-CM | POA: Diagnosis not present

## 2023-11-29 DIAGNOSIS — Z4932 Encounter for adequacy testing for peritoneal dialysis: Secondary | ICD-10-CM | POA: Diagnosis not present

## 2023-11-29 DIAGNOSIS — Z992 Dependence on renal dialysis: Secondary | ICD-10-CM | POA: Diagnosis not present

## 2023-11-29 DIAGNOSIS — F4312 Post-traumatic stress disorder, chronic: Secondary | ICD-10-CM | POA: Diagnosis not present

## 2023-11-29 DIAGNOSIS — K659 Peritonitis, unspecified: Secondary | ICD-10-CM | POA: Diagnosis not present

## 2023-11-29 DIAGNOSIS — D631 Anemia in chronic kidney disease: Secondary | ICD-10-CM | POA: Diagnosis not present

## 2023-11-29 DIAGNOSIS — N186 End stage renal disease: Secondary | ICD-10-CM | POA: Diagnosis not present

## 2023-11-29 DIAGNOSIS — N2581 Secondary hyperparathyroidism of renal origin: Secondary | ICD-10-CM | POA: Diagnosis not present

## 2023-11-30 DIAGNOSIS — Z4932 Encounter for adequacy testing for peritoneal dialysis: Secondary | ICD-10-CM | POA: Diagnosis not present

## 2023-11-30 DIAGNOSIS — K659 Peritonitis, unspecified: Secondary | ICD-10-CM | POA: Diagnosis not present

## 2023-11-30 DIAGNOSIS — N2581 Secondary hyperparathyroidism of renal origin: Secondary | ICD-10-CM | POA: Diagnosis not present

## 2023-11-30 DIAGNOSIS — Z992 Dependence on renal dialysis: Secondary | ICD-10-CM | POA: Diagnosis not present

## 2023-11-30 DIAGNOSIS — N186 End stage renal disease: Secondary | ICD-10-CM | POA: Diagnosis not present

## 2023-11-30 DIAGNOSIS — D631 Anemia in chronic kidney disease: Secondary | ICD-10-CM | POA: Diagnosis not present

## 2023-12-01 DIAGNOSIS — N186 End stage renal disease: Secondary | ICD-10-CM | POA: Diagnosis not present

## 2023-12-01 DIAGNOSIS — Z992 Dependence on renal dialysis: Secondary | ICD-10-CM | POA: Diagnosis not present

## 2023-12-01 DIAGNOSIS — K659 Peritonitis, unspecified: Secondary | ICD-10-CM | POA: Diagnosis not present

## 2023-12-01 DIAGNOSIS — N2581 Secondary hyperparathyroidism of renal origin: Secondary | ICD-10-CM | POA: Diagnosis not present

## 2023-12-01 DIAGNOSIS — D631 Anemia in chronic kidney disease: Secondary | ICD-10-CM | POA: Diagnosis not present

## 2023-12-01 DIAGNOSIS — Z4932 Encounter for adequacy testing for peritoneal dialysis: Secondary | ICD-10-CM | POA: Diagnosis not present

## 2023-12-02 DIAGNOSIS — Z4932 Encounter for adequacy testing for peritoneal dialysis: Secondary | ICD-10-CM | POA: Diagnosis not present

## 2023-12-02 DIAGNOSIS — N2581 Secondary hyperparathyroidism of renal origin: Secondary | ICD-10-CM | POA: Diagnosis not present

## 2023-12-02 DIAGNOSIS — K659 Peritonitis, unspecified: Secondary | ICD-10-CM | POA: Diagnosis not present

## 2023-12-02 DIAGNOSIS — D631 Anemia in chronic kidney disease: Secondary | ICD-10-CM | POA: Diagnosis not present

## 2023-12-02 DIAGNOSIS — Z992 Dependence on renal dialysis: Secondary | ICD-10-CM | POA: Diagnosis not present

## 2023-12-02 DIAGNOSIS — N186 End stage renal disease: Secondary | ICD-10-CM | POA: Diagnosis not present

## 2023-12-03 DIAGNOSIS — N186 End stage renal disease: Secondary | ICD-10-CM | POA: Diagnosis not present

## 2023-12-03 DIAGNOSIS — K659 Peritonitis, unspecified: Secondary | ICD-10-CM | POA: Diagnosis not present

## 2023-12-03 DIAGNOSIS — Z4932 Encounter for adequacy testing for peritoneal dialysis: Secondary | ICD-10-CM | POA: Diagnosis not present

## 2023-12-03 DIAGNOSIS — Z992 Dependence on renal dialysis: Secondary | ICD-10-CM | POA: Diagnosis not present

## 2023-12-03 DIAGNOSIS — D631 Anemia in chronic kidney disease: Secondary | ICD-10-CM | POA: Diagnosis not present

## 2023-12-03 DIAGNOSIS — N2581 Secondary hyperparathyroidism of renal origin: Secondary | ICD-10-CM | POA: Diagnosis not present

## 2023-12-04 DIAGNOSIS — D631 Anemia in chronic kidney disease: Secondary | ICD-10-CM | POA: Diagnosis not present

## 2023-12-04 DIAGNOSIS — K659 Peritonitis, unspecified: Secondary | ICD-10-CM | POA: Diagnosis not present

## 2023-12-04 DIAGNOSIS — N2581 Secondary hyperparathyroidism of renal origin: Secondary | ICD-10-CM | POA: Diagnosis not present

## 2023-12-04 DIAGNOSIS — N186 End stage renal disease: Secondary | ICD-10-CM | POA: Diagnosis not present

## 2023-12-04 DIAGNOSIS — Z992 Dependence on renal dialysis: Secondary | ICD-10-CM | POA: Diagnosis not present

## 2023-12-04 DIAGNOSIS — Z4932 Encounter for adequacy testing for peritoneal dialysis: Secondary | ICD-10-CM | POA: Diagnosis not present

## 2023-12-05 DIAGNOSIS — K659 Peritonitis, unspecified: Secondary | ICD-10-CM | POA: Diagnosis not present

## 2023-12-05 DIAGNOSIS — Z992 Dependence on renal dialysis: Secondary | ICD-10-CM | POA: Diagnosis not present

## 2023-12-05 DIAGNOSIS — N186 End stage renal disease: Secondary | ICD-10-CM | POA: Diagnosis not present

## 2023-12-05 DIAGNOSIS — N2581 Secondary hyperparathyroidism of renal origin: Secondary | ICD-10-CM | POA: Diagnosis not present

## 2023-12-05 DIAGNOSIS — Z4932 Encounter for adequacy testing for peritoneal dialysis: Secondary | ICD-10-CM | POA: Diagnosis not present

## 2023-12-05 DIAGNOSIS — D631 Anemia in chronic kidney disease: Secondary | ICD-10-CM | POA: Diagnosis not present

## 2023-12-06 DIAGNOSIS — N186 End stage renal disease: Secondary | ICD-10-CM | POA: Diagnosis not present

## 2023-12-06 DIAGNOSIS — F4312 Post-traumatic stress disorder, chronic: Secondary | ICD-10-CM | POA: Diagnosis not present

## 2023-12-06 DIAGNOSIS — Z992 Dependence on renal dialysis: Secondary | ICD-10-CM | POA: Diagnosis not present

## 2023-12-06 DIAGNOSIS — K659 Peritonitis, unspecified: Secondary | ICD-10-CM | POA: Diagnosis not present

## 2023-12-06 DIAGNOSIS — Z4932 Encounter for adequacy testing for peritoneal dialysis: Secondary | ICD-10-CM | POA: Diagnosis not present

## 2023-12-06 DIAGNOSIS — D631 Anemia in chronic kidney disease: Secondary | ICD-10-CM | POA: Diagnosis not present

## 2023-12-06 DIAGNOSIS — N2581 Secondary hyperparathyroidism of renal origin: Secondary | ICD-10-CM | POA: Diagnosis not present

## 2023-12-07 DIAGNOSIS — Z992 Dependence on renal dialysis: Secondary | ICD-10-CM | POA: Diagnosis not present

## 2023-12-07 DIAGNOSIS — K659 Peritonitis, unspecified: Secondary | ICD-10-CM | POA: Diagnosis not present

## 2023-12-07 DIAGNOSIS — N186 End stage renal disease: Secondary | ICD-10-CM | POA: Diagnosis not present

## 2023-12-07 DIAGNOSIS — N2581 Secondary hyperparathyroidism of renal origin: Secondary | ICD-10-CM | POA: Diagnosis not present

## 2023-12-07 DIAGNOSIS — D631 Anemia in chronic kidney disease: Secondary | ICD-10-CM | POA: Diagnosis not present

## 2023-12-07 DIAGNOSIS — Z4932 Encounter for adequacy testing for peritoneal dialysis: Secondary | ICD-10-CM | POA: Diagnosis not present

## 2023-12-08 DIAGNOSIS — N2581 Secondary hyperparathyroidism of renal origin: Secondary | ICD-10-CM | POA: Diagnosis not present

## 2023-12-08 DIAGNOSIS — Z4932 Encounter for adequacy testing for peritoneal dialysis: Secondary | ICD-10-CM | POA: Diagnosis not present

## 2023-12-08 DIAGNOSIS — D631 Anemia in chronic kidney disease: Secondary | ICD-10-CM | POA: Diagnosis not present

## 2023-12-08 DIAGNOSIS — N186 End stage renal disease: Secondary | ICD-10-CM | POA: Diagnosis not present

## 2023-12-08 DIAGNOSIS — Z992 Dependence on renal dialysis: Secondary | ICD-10-CM | POA: Diagnosis not present

## 2023-12-08 DIAGNOSIS — K659 Peritonitis, unspecified: Secondary | ICD-10-CM | POA: Diagnosis not present

## 2023-12-09 DIAGNOSIS — N186 End stage renal disease: Secondary | ICD-10-CM | POA: Diagnosis not present

## 2023-12-09 DIAGNOSIS — N2581 Secondary hyperparathyroidism of renal origin: Secondary | ICD-10-CM | POA: Diagnosis not present

## 2023-12-09 DIAGNOSIS — Z992 Dependence on renal dialysis: Secondary | ICD-10-CM | POA: Diagnosis not present

## 2023-12-09 DIAGNOSIS — K659 Peritonitis, unspecified: Secondary | ICD-10-CM | POA: Diagnosis not present

## 2023-12-09 DIAGNOSIS — D631 Anemia in chronic kidney disease: Secondary | ICD-10-CM | POA: Diagnosis not present

## 2023-12-09 DIAGNOSIS — Z4932 Encounter for adequacy testing for peritoneal dialysis: Secondary | ICD-10-CM | POA: Diagnosis not present

## 2023-12-10 DIAGNOSIS — K659 Peritonitis, unspecified: Secondary | ICD-10-CM | POA: Diagnosis not present

## 2023-12-10 DIAGNOSIS — D631 Anemia in chronic kidney disease: Secondary | ICD-10-CM | POA: Diagnosis not present

## 2023-12-10 DIAGNOSIS — Z992 Dependence on renal dialysis: Secondary | ICD-10-CM | POA: Diagnosis not present

## 2023-12-10 DIAGNOSIS — N2581 Secondary hyperparathyroidism of renal origin: Secondary | ICD-10-CM | POA: Diagnosis not present

## 2023-12-10 DIAGNOSIS — Z4932 Encounter for adequacy testing for peritoneal dialysis: Secondary | ICD-10-CM | POA: Diagnosis not present

## 2023-12-10 DIAGNOSIS — N186 End stage renal disease: Secondary | ICD-10-CM | POA: Diagnosis not present

## 2023-12-11 DIAGNOSIS — K659 Peritonitis, unspecified: Secondary | ICD-10-CM | POA: Diagnosis not present

## 2023-12-11 DIAGNOSIS — N2581 Secondary hyperparathyroidism of renal origin: Secondary | ICD-10-CM | POA: Diagnosis not present

## 2023-12-11 DIAGNOSIS — Z992 Dependence on renal dialysis: Secondary | ICD-10-CM | POA: Diagnosis not present

## 2023-12-11 DIAGNOSIS — N186 End stage renal disease: Secondary | ICD-10-CM | POA: Diagnosis not present

## 2023-12-11 DIAGNOSIS — D631 Anemia in chronic kidney disease: Secondary | ICD-10-CM | POA: Diagnosis not present

## 2023-12-11 DIAGNOSIS — Z4932 Encounter for adequacy testing for peritoneal dialysis: Secondary | ICD-10-CM | POA: Diagnosis not present

## 2023-12-12 DIAGNOSIS — Z992 Dependence on renal dialysis: Secondary | ICD-10-CM | POA: Diagnosis not present

## 2023-12-12 DIAGNOSIS — N186 End stage renal disease: Secondary | ICD-10-CM | POA: Diagnosis not present

## 2023-12-12 DIAGNOSIS — N2581 Secondary hyperparathyroidism of renal origin: Secondary | ICD-10-CM | POA: Diagnosis not present

## 2023-12-12 DIAGNOSIS — D631 Anemia in chronic kidney disease: Secondary | ICD-10-CM | POA: Diagnosis not present

## 2023-12-12 DIAGNOSIS — K659 Peritonitis, unspecified: Secondary | ICD-10-CM | POA: Diagnosis not present

## 2023-12-12 DIAGNOSIS — Z4932 Encounter for adequacy testing for peritoneal dialysis: Secondary | ICD-10-CM | POA: Diagnosis not present

## 2023-12-13 DIAGNOSIS — Z992 Dependence on renal dialysis: Secondary | ICD-10-CM | POA: Diagnosis not present

## 2023-12-13 DIAGNOSIS — N2581 Secondary hyperparathyroidism of renal origin: Secondary | ICD-10-CM | POA: Diagnosis not present

## 2023-12-13 DIAGNOSIS — F4312 Post-traumatic stress disorder, chronic: Secondary | ICD-10-CM | POA: Diagnosis not present

## 2023-12-13 DIAGNOSIS — Z4932 Encounter for adequacy testing for peritoneal dialysis: Secondary | ICD-10-CM | POA: Diagnosis not present

## 2023-12-13 DIAGNOSIS — K659 Peritonitis, unspecified: Secondary | ICD-10-CM | POA: Diagnosis not present

## 2023-12-13 DIAGNOSIS — D631 Anemia in chronic kidney disease: Secondary | ICD-10-CM | POA: Diagnosis not present

## 2023-12-13 DIAGNOSIS — N186 End stage renal disease: Secondary | ICD-10-CM | POA: Diagnosis not present

## 2023-12-14 DIAGNOSIS — Z992 Dependence on renal dialysis: Secondary | ICD-10-CM | POA: Diagnosis not present

## 2023-12-14 DIAGNOSIS — R0902 Hypoxemia: Secondary | ICD-10-CM | POA: Diagnosis not present

## 2023-12-14 DIAGNOSIS — Z5181 Encounter for therapeutic drug level monitoring: Secondary | ICD-10-CM | POA: Diagnosis not present

## 2023-12-14 DIAGNOSIS — E66813 Obesity, class 3: Secondary | ICD-10-CM | POA: Diagnosis not present

## 2023-12-14 DIAGNOSIS — I071 Rheumatic tricuspid insufficiency: Secondary | ICD-10-CM | POA: Diagnosis not present

## 2023-12-14 DIAGNOSIS — I4510 Unspecified right bundle-branch block: Secondary | ICD-10-CM | POA: Diagnosis not present

## 2023-12-14 DIAGNOSIS — N2581 Secondary hyperparathyroidism of renal origin: Secondary | ICD-10-CM | POA: Diagnosis not present

## 2023-12-14 DIAGNOSIS — D631 Anemia in chronic kidney disease: Secondary | ICD-10-CM | POA: Diagnosis not present

## 2023-12-14 DIAGNOSIS — R9431 Abnormal electrocardiogram [ECG] [EKG]: Secondary | ICD-10-CM | POA: Diagnosis not present

## 2023-12-14 DIAGNOSIS — J45909 Unspecified asthma, uncomplicated: Secondary | ICD-10-CM | POA: Diagnosis not present

## 2023-12-14 DIAGNOSIS — I5042 Chronic combined systolic (congestive) and diastolic (congestive) heart failure: Secondary | ICD-10-CM | POA: Diagnosis not present

## 2023-12-14 DIAGNOSIS — I420 Dilated cardiomyopathy: Secondary | ICD-10-CM | POA: Diagnosis not present

## 2023-12-14 DIAGNOSIS — I493 Ventricular premature depolarization: Secondary | ICD-10-CM | POA: Diagnosis not present

## 2023-12-14 DIAGNOSIS — E785 Hyperlipidemia, unspecified: Secondary | ICD-10-CM | POA: Diagnosis not present

## 2023-12-14 DIAGNOSIS — I5189 Other ill-defined heart diseases: Secondary | ICD-10-CM | POA: Diagnosis not present

## 2023-12-14 DIAGNOSIS — E1151 Type 2 diabetes mellitus with diabetic peripheral angiopathy without gangrene: Secondary | ICD-10-CM | POA: Diagnosis not present

## 2023-12-14 DIAGNOSIS — Z954 Presence of other heart-valve replacement: Secondary | ICD-10-CM | POA: Diagnosis not present

## 2023-12-14 DIAGNOSIS — I517 Cardiomegaly: Secondary | ICD-10-CM | POA: Diagnosis not present

## 2023-12-14 DIAGNOSIS — I509 Heart failure, unspecified: Secondary | ICD-10-CM | POA: Diagnosis not present

## 2023-12-14 DIAGNOSIS — I371 Nonrheumatic pulmonary valve insufficiency: Secondary | ICD-10-CM | POA: Diagnosis not present

## 2023-12-14 DIAGNOSIS — E876 Hypokalemia: Secondary | ICD-10-CM | POA: Diagnosis not present

## 2023-12-14 DIAGNOSIS — Z6841 Body Mass Index (BMI) 40.0 and over, adult: Secondary | ICD-10-CM | POA: Diagnosis not present

## 2023-12-14 DIAGNOSIS — E1122 Type 2 diabetes mellitus with diabetic chronic kidney disease: Secondary | ICD-10-CM | POA: Diagnosis not present

## 2023-12-14 DIAGNOSIS — I440 Atrioventricular block, first degree: Secondary | ICD-10-CM | POA: Diagnosis not present

## 2023-12-14 DIAGNOSIS — I5022 Chronic systolic (congestive) heart failure: Secondary | ICD-10-CM | POA: Diagnosis not present

## 2023-12-14 DIAGNOSIS — J8283 Eosinophilic asthma: Secondary | ICD-10-CM | POA: Diagnosis not present

## 2023-12-14 DIAGNOSIS — K659 Peritonitis, unspecified: Secondary | ICD-10-CM | POA: Diagnosis not present

## 2023-12-14 DIAGNOSIS — I2510 Atherosclerotic heart disease of native coronary artery without angina pectoris: Secondary | ICD-10-CM | POA: Diagnosis not present

## 2023-12-14 DIAGNOSIS — I973 Postprocedural hypertension: Secondary | ICD-10-CM | POA: Diagnosis not present

## 2023-12-14 DIAGNOSIS — Z4932 Encounter for adequacy testing for peritoneal dialysis: Secondary | ICD-10-CM | POA: Diagnosis not present

## 2023-12-14 DIAGNOSIS — I459 Conduction disorder, unspecified: Secondary | ICD-10-CM | POA: Diagnosis not present

## 2023-12-14 DIAGNOSIS — N186 End stage renal disease: Secondary | ICD-10-CM | POA: Diagnosis not present

## 2023-12-14 DIAGNOSIS — I132 Hypertensive heart and chronic kidney disease with heart failure and with stage 5 chronic kidney disease, or end stage renal disease: Secondary | ICD-10-CM | POA: Diagnosis not present

## 2023-12-14 DIAGNOSIS — G4733 Obstructive sleep apnea (adult) (pediatric): Secondary | ICD-10-CM | POA: Diagnosis not present

## 2023-12-15 ENCOUNTER — Other Ambulatory Visit (HOSPITAL_BASED_OUTPATIENT_CLINIC_OR_DEPARTMENT_OTHER): Payer: Self-pay

## 2023-12-15 DIAGNOSIS — N2581 Secondary hyperparathyroidism of renal origin: Secondary | ICD-10-CM | POA: Diagnosis not present

## 2023-12-15 DIAGNOSIS — K659 Peritonitis, unspecified: Secondary | ICD-10-CM | POA: Diagnosis not present

## 2023-12-15 DIAGNOSIS — D631 Anemia in chronic kidney disease: Secondary | ICD-10-CM | POA: Diagnosis not present

## 2023-12-15 DIAGNOSIS — N186 End stage renal disease: Secondary | ICD-10-CM | POA: Diagnosis not present

## 2023-12-15 DIAGNOSIS — Z5181 Encounter for therapeutic drug level monitoring: Secondary | ICD-10-CM | POA: Diagnosis not present

## 2023-12-15 DIAGNOSIS — Z992 Dependence on renal dialysis: Secondary | ICD-10-CM | POA: Diagnosis not present

## 2023-12-15 MED ORDER — AMOXICILLIN 500 MG PO CAPS
2000.0000 mg | ORAL_CAPSULE | ORAL | 0 refills | Status: AC | PRN
  Filled 2023-12-15: qty 24, 6d supply, fill #0

## 2023-12-15 MED ORDER — ASPIRIN 81 MG PO CHEW
81.0000 mg | CHEWABLE_TABLET | Freq: Every day | ORAL | 0 refills | Status: AC
  Filled 2023-12-15: qty 90, 90d supply, fill #0

## 2023-12-15 MED ORDER — ATORVASTATIN CALCIUM 40 MG PO TABS
40.0000 mg | ORAL_TABLET | Freq: Every day | ORAL | 0 refills | Status: AC
  Filled 2023-12-15: qty 90, 90d supply, fill #0

## 2023-12-15 MED ORDER — CARVEDILOL 25 MG PO TABS: 25.0000 mg | ORAL_TABLET | Freq: Two times a day (BID) | ORAL | 0 refills | Status: DC

## 2023-12-16 DIAGNOSIS — N2581 Secondary hyperparathyroidism of renal origin: Secondary | ICD-10-CM | POA: Diagnosis not present

## 2023-12-16 DIAGNOSIS — D631 Anemia in chronic kidney disease: Secondary | ICD-10-CM | POA: Diagnosis not present

## 2023-12-16 DIAGNOSIS — N186 End stage renal disease: Secondary | ICD-10-CM | POA: Diagnosis not present

## 2023-12-16 DIAGNOSIS — K659 Peritonitis, unspecified: Secondary | ICD-10-CM | POA: Diagnosis not present

## 2023-12-16 DIAGNOSIS — Z5181 Encounter for therapeutic drug level monitoring: Secondary | ICD-10-CM | POA: Diagnosis not present

## 2023-12-16 DIAGNOSIS — Z992 Dependence on renal dialysis: Secondary | ICD-10-CM | POA: Diagnosis not present

## 2023-12-17 DIAGNOSIS — N186 End stage renal disease: Secondary | ICD-10-CM | POA: Diagnosis not present

## 2023-12-17 DIAGNOSIS — N2581 Secondary hyperparathyroidism of renal origin: Secondary | ICD-10-CM | POA: Diagnosis not present

## 2023-12-17 DIAGNOSIS — D631 Anemia in chronic kidney disease: Secondary | ICD-10-CM | POA: Diagnosis not present

## 2023-12-17 DIAGNOSIS — Z5181 Encounter for therapeutic drug level monitoring: Secondary | ICD-10-CM | POA: Diagnosis not present

## 2023-12-17 DIAGNOSIS — Z992 Dependence on renal dialysis: Secondary | ICD-10-CM | POA: Diagnosis not present

## 2023-12-17 DIAGNOSIS — K659 Peritonitis, unspecified: Secondary | ICD-10-CM | POA: Diagnosis not present

## 2023-12-18 DIAGNOSIS — D631 Anemia in chronic kidney disease: Secondary | ICD-10-CM | POA: Diagnosis not present

## 2023-12-18 DIAGNOSIS — N186 End stage renal disease: Secondary | ICD-10-CM | POA: Diagnosis not present

## 2023-12-18 DIAGNOSIS — Z992 Dependence on renal dialysis: Secondary | ICD-10-CM | POA: Diagnosis not present

## 2023-12-18 DIAGNOSIS — Z5181 Encounter for therapeutic drug level monitoring: Secondary | ICD-10-CM | POA: Diagnosis not present

## 2023-12-18 DIAGNOSIS — N2581 Secondary hyperparathyroidism of renal origin: Secondary | ICD-10-CM | POA: Diagnosis not present

## 2023-12-18 DIAGNOSIS — K659 Peritonitis, unspecified: Secondary | ICD-10-CM | POA: Diagnosis not present

## 2023-12-19 DIAGNOSIS — Z5181 Encounter for therapeutic drug level monitoring: Secondary | ICD-10-CM | POA: Diagnosis not present

## 2023-12-19 DIAGNOSIS — N186 End stage renal disease: Secondary | ICD-10-CM | POA: Diagnosis not present

## 2023-12-19 DIAGNOSIS — D631 Anemia in chronic kidney disease: Secondary | ICD-10-CM | POA: Diagnosis not present

## 2023-12-19 DIAGNOSIS — N2581 Secondary hyperparathyroidism of renal origin: Secondary | ICD-10-CM | POA: Diagnosis not present

## 2023-12-19 DIAGNOSIS — Z992 Dependence on renal dialysis: Secondary | ICD-10-CM | POA: Diagnosis not present

## 2023-12-19 DIAGNOSIS — K659 Peritonitis, unspecified: Secondary | ICD-10-CM | POA: Diagnosis not present

## 2023-12-20 ENCOUNTER — Telehealth: Payer: Self-pay | Admitting: Cardiology

## 2023-12-20 DIAGNOSIS — D631 Anemia in chronic kidney disease: Secondary | ICD-10-CM | POA: Diagnosis not present

## 2023-12-20 DIAGNOSIS — Z5181 Encounter for therapeutic drug level monitoring: Secondary | ICD-10-CM | POA: Diagnosis not present

## 2023-12-20 DIAGNOSIS — N186 End stage renal disease: Secondary | ICD-10-CM | POA: Diagnosis not present

## 2023-12-20 DIAGNOSIS — K659 Peritonitis, unspecified: Secondary | ICD-10-CM | POA: Diagnosis not present

## 2023-12-20 DIAGNOSIS — Z992 Dependence on renal dialysis: Secondary | ICD-10-CM | POA: Diagnosis not present

## 2023-12-20 DIAGNOSIS — N2581 Secondary hyperparathyroidism of renal origin: Secondary | ICD-10-CM | POA: Diagnosis not present

## 2023-12-20 NOTE — Telephone Encounter (Signed)
 Provider requesting a c/b from Dr. Jacalyn nurse regarding Heart Cath that he performed on pt. Please advise

## 2023-12-21 DIAGNOSIS — Z992 Dependence on renal dialysis: Secondary | ICD-10-CM | POA: Diagnosis not present

## 2023-12-21 DIAGNOSIS — K659 Peritonitis, unspecified: Secondary | ICD-10-CM | POA: Diagnosis not present

## 2023-12-21 DIAGNOSIS — N186 End stage renal disease: Secondary | ICD-10-CM | POA: Diagnosis not present

## 2023-12-21 DIAGNOSIS — N2581 Secondary hyperparathyroidism of renal origin: Secondary | ICD-10-CM | POA: Diagnosis not present

## 2023-12-21 DIAGNOSIS — D631 Anemia in chronic kidney disease: Secondary | ICD-10-CM | POA: Diagnosis not present

## 2023-12-21 DIAGNOSIS — Z5181 Encounter for therapeutic drug level monitoring: Secondary | ICD-10-CM | POA: Diagnosis not present

## 2023-12-22 DIAGNOSIS — D631 Anemia in chronic kidney disease: Secondary | ICD-10-CM | POA: Diagnosis not present

## 2023-12-22 DIAGNOSIS — Z992 Dependence on renal dialysis: Secondary | ICD-10-CM | POA: Diagnosis not present

## 2023-12-22 DIAGNOSIS — N186 End stage renal disease: Secondary | ICD-10-CM | POA: Diagnosis not present

## 2023-12-22 DIAGNOSIS — K659 Peritonitis, unspecified: Secondary | ICD-10-CM | POA: Diagnosis not present

## 2023-12-22 DIAGNOSIS — N2581 Secondary hyperparathyroidism of renal origin: Secondary | ICD-10-CM | POA: Diagnosis not present

## 2023-12-22 DIAGNOSIS — Z5181 Encounter for therapeutic drug level monitoring: Secondary | ICD-10-CM | POA: Diagnosis not present

## 2023-12-23 DIAGNOSIS — Z5181 Encounter for therapeutic drug level monitoring: Secondary | ICD-10-CM | POA: Diagnosis not present

## 2023-12-23 DIAGNOSIS — Z992 Dependence on renal dialysis: Secondary | ICD-10-CM | POA: Diagnosis not present

## 2023-12-23 DIAGNOSIS — N186 End stage renal disease: Secondary | ICD-10-CM | POA: Diagnosis not present

## 2023-12-23 DIAGNOSIS — K659 Peritonitis, unspecified: Secondary | ICD-10-CM | POA: Diagnosis not present

## 2023-12-23 DIAGNOSIS — D631 Anemia in chronic kidney disease: Secondary | ICD-10-CM | POA: Diagnosis not present

## 2023-12-23 DIAGNOSIS — N2581 Secondary hyperparathyroidism of renal origin: Secondary | ICD-10-CM | POA: Diagnosis not present

## 2023-12-24 DIAGNOSIS — Z992 Dependence on renal dialysis: Secondary | ICD-10-CM | POA: Diagnosis not present

## 2023-12-24 DIAGNOSIS — Z5181 Encounter for therapeutic drug level monitoring: Secondary | ICD-10-CM | POA: Diagnosis not present

## 2023-12-24 DIAGNOSIS — N2581 Secondary hyperparathyroidism of renal origin: Secondary | ICD-10-CM | POA: Diagnosis not present

## 2023-12-24 DIAGNOSIS — N186 End stage renal disease: Secondary | ICD-10-CM | POA: Diagnosis not present

## 2023-12-24 DIAGNOSIS — K659 Peritonitis, unspecified: Secondary | ICD-10-CM | POA: Diagnosis not present

## 2023-12-24 DIAGNOSIS — D631 Anemia in chronic kidney disease: Secondary | ICD-10-CM | POA: Diagnosis not present

## 2023-12-25 DIAGNOSIS — E7800 Pure hypercholesterolemia, unspecified: Secondary | ICD-10-CM | POA: Diagnosis not present

## 2023-12-25 DIAGNOSIS — Z992 Dependence on renal dialysis: Secondary | ICD-10-CM | POA: Diagnosis not present

## 2023-12-25 DIAGNOSIS — E1122 Type 2 diabetes mellitus with diabetic chronic kidney disease: Secondary | ICD-10-CM | POA: Diagnosis not present

## 2023-12-25 DIAGNOSIS — Z5181 Encounter for therapeutic drug level monitoring: Secondary | ICD-10-CM | POA: Diagnosis not present

## 2023-12-25 DIAGNOSIS — N2581 Secondary hyperparathyroidism of renal origin: Secondary | ICD-10-CM | POA: Diagnosis not present

## 2023-12-25 DIAGNOSIS — K659 Peritonitis, unspecified: Secondary | ICD-10-CM | POA: Diagnosis not present

## 2023-12-25 DIAGNOSIS — N186 End stage renal disease: Secondary | ICD-10-CM | POA: Diagnosis not present

## 2023-12-25 DIAGNOSIS — D631 Anemia in chronic kidney disease: Secondary | ICD-10-CM | POA: Diagnosis not present

## 2023-12-25 DIAGNOSIS — Z6841 Body Mass Index (BMI) 40.0 and over, adult: Secondary | ICD-10-CM | POA: Diagnosis not present

## 2023-12-25 DIAGNOSIS — I10 Essential (primary) hypertension: Secondary | ICD-10-CM | POA: Diagnosis not present

## 2023-12-26 DIAGNOSIS — N186 End stage renal disease: Secondary | ICD-10-CM | POA: Diagnosis not present

## 2023-12-26 DIAGNOSIS — N2581 Secondary hyperparathyroidism of renal origin: Secondary | ICD-10-CM | POA: Diagnosis not present

## 2023-12-26 DIAGNOSIS — Z5181 Encounter for therapeutic drug level monitoring: Secondary | ICD-10-CM | POA: Diagnosis not present

## 2023-12-26 DIAGNOSIS — Z992 Dependence on renal dialysis: Secondary | ICD-10-CM | POA: Diagnosis not present

## 2023-12-26 DIAGNOSIS — D631 Anemia in chronic kidney disease: Secondary | ICD-10-CM | POA: Diagnosis not present

## 2023-12-26 DIAGNOSIS — K659 Peritonitis, unspecified: Secondary | ICD-10-CM | POA: Diagnosis not present

## 2023-12-27 DIAGNOSIS — D631 Anemia in chronic kidney disease: Secondary | ICD-10-CM | POA: Diagnosis not present

## 2023-12-27 DIAGNOSIS — K659 Peritonitis, unspecified: Secondary | ICD-10-CM | POA: Diagnosis not present

## 2023-12-27 DIAGNOSIS — Z992 Dependence on renal dialysis: Secondary | ICD-10-CM | POA: Diagnosis not present

## 2023-12-27 DIAGNOSIS — I4510 Unspecified right bundle-branch block: Secondary | ICD-10-CM | POA: Diagnosis not present

## 2023-12-27 DIAGNOSIS — Z5181 Encounter for therapeutic drug level monitoring: Secondary | ICD-10-CM | POA: Diagnosis not present

## 2023-12-27 DIAGNOSIS — I371 Nonrheumatic pulmonary valve insufficiency: Secondary | ICD-10-CM | POA: Diagnosis not present

## 2023-12-27 DIAGNOSIS — N2581 Secondary hyperparathyroidism of renal origin: Secondary | ICD-10-CM | POA: Diagnosis not present

## 2023-12-27 DIAGNOSIS — N186 End stage renal disease: Secondary | ICD-10-CM | POA: Diagnosis not present

## 2023-12-28 ENCOUNTER — Encounter: Payer: Self-pay | Admitting: Cardiology

## 2023-12-28 DIAGNOSIS — N186 End stage renal disease: Secondary | ICD-10-CM | POA: Diagnosis not present

## 2023-12-28 DIAGNOSIS — Z952 Presence of prosthetic heart valve: Secondary | ICD-10-CM | POA: Diagnosis not present

## 2023-12-28 DIAGNOSIS — Z5181 Encounter for therapeutic drug level monitoring: Secondary | ICD-10-CM | POA: Diagnosis not present

## 2023-12-28 DIAGNOSIS — D631 Anemia in chronic kidney disease: Secondary | ICD-10-CM | POA: Diagnosis not present

## 2023-12-28 DIAGNOSIS — E1122 Type 2 diabetes mellitus with diabetic chronic kidney disease: Secondary | ICD-10-CM | POA: Diagnosis not present

## 2023-12-28 DIAGNOSIS — Z992 Dependence on renal dialysis: Secondary | ICD-10-CM | POA: Diagnosis not present

## 2023-12-28 DIAGNOSIS — N2581 Secondary hyperparathyroidism of renal origin: Secondary | ICD-10-CM | POA: Diagnosis not present

## 2023-12-28 DIAGNOSIS — I5189 Other ill-defined heart diseases: Secondary | ICD-10-CM | POA: Diagnosis not present

## 2023-12-28 DIAGNOSIS — K659 Peritonitis, unspecified: Secondary | ICD-10-CM | POA: Diagnosis not present

## 2023-12-29 ENCOUNTER — Telehealth: Payer: Self-pay

## 2023-12-29 ENCOUNTER — Encounter (HOSPITAL_BASED_OUTPATIENT_CLINIC_OR_DEPARTMENT_OTHER): Payer: Self-pay

## 2023-12-29 ENCOUNTER — Other Ambulatory Visit (HOSPITAL_BASED_OUTPATIENT_CLINIC_OR_DEPARTMENT_OTHER): Payer: Self-pay

## 2023-12-29 DIAGNOSIS — K659 Peritonitis, unspecified: Secondary | ICD-10-CM | POA: Diagnosis not present

## 2023-12-29 DIAGNOSIS — D631 Anemia in chronic kidney disease: Secondary | ICD-10-CM | POA: Diagnosis not present

## 2023-12-29 DIAGNOSIS — Z5181 Encounter for therapeutic drug level monitoring: Secondary | ICD-10-CM | POA: Diagnosis not present

## 2023-12-29 DIAGNOSIS — N186 End stage renal disease: Secondary | ICD-10-CM | POA: Diagnosis not present

## 2023-12-29 DIAGNOSIS — N2581 Secondary hyperparathyroidism of renal origin: Secondary | ICD-10-CM | POA: Diagnosis not present

## 2023-12-29 DIAGNOSIS — Z992 Dependence on renal dialysis: Secondary | ICD-10-CM | POA: Diagnosis not present

## 2023-12-29 NOTE — Telephone Encounter (Signed)
 Patient confirms he has not taken amlodipine  since Dr. Arlana discontinued it but has continued carvedilol  25 mg BID.

## 2023-12-29 NOTE — Telephone Encounter (Signed)
 Call to patient to verify when he stopped taking amlodipine . No answer, left detailed message per DPR asking patient to call our office. [facility] sent.

## 2023-12-30 DIAGNOSIS — N186 End stage renal disease: Secondary | ICD-10-CM | POA: Diagnosis not present

## 2023-12-30 DIAGNOSIS — D631 Anemia in chronic kidney disease: Secondary | ICD-10-CM | POA: Diagnosis not present

## 2023-12-30 DIAGNOSIS — Z5181 Encounter for therapeutic drug level monitoring: Secondary | ICD-10-CM | POA: Diagnosis not present

## 2023-12-30 DIAGNOSIS — N2581 Secondary hyperparathyroidism of renal origin: Secondary | ICD-10-CM | POA: Diagnosis not present

## 2023-12-30 DIAGNOSIS — K659 Peritonitis, unspecified: Secondary | ICD-10-CM | POA: Diagnosis not present

## 2023-12-30 DIAGNOSIS — Z992 Dependence on renal dialysis: Secondary | ICD-10-CM | POA: Diagnosis not present

## 2023-12-31 DIAGNOSIS — D631 Anemia in chronic kidney disease: Secondary | ICD-10-CM | POA: Diagnosis not present

## 2023-12-31 DIAGNOSIS — Z5181 Encounter for therapeutic drug level monitoring: Secondary | ICD-10-CM | POA: Diagnosis not present

## 2023-12-31 DIAGNOSIS — N186 End stage renal disease: Secondary | ICD-10-CM | POA: Diagnosis not present

## 2023-12-31 DIAGNOSIS — Z992 Dependence on renal dialysis: Secondary | ICD-10-CM | POA: Diagnosis not present

## 2023-12-31 DIAGNOSIS — N2581 Secondary hyperparathyroidism of renal origin: Secondary | ICD-10-CM | POA: Diagnosis not present

## 2023-12-31 DIAGNOSIS — K659 Peritonitis, unspecified: Secondary | ICD-10-CM | POA: Diagnosis not present

## 2024-01-01 ENCOUNTER — Other Ambulatory Visit (HOSPITAL_BASED_OUTPATIENT_CLINIC_OR_DEPARTMENT_OTHER): Payer: Self-pay

## 2024-01-01 ENCOUNTER — Other Ambulatory Visit: Payer: Self-pay

## 2024-01-01 DIAGNOSIS — N2581 Secondary hyperparathyroidism of renal origin: Secondary | ICD-10-CM | POA: Diagnosis not present

## 2024-01-01 DIAGNOSIS — D631 Anemia in chronic kidney disease: Secondary | ICD-10-CM | POA: Diagnosis not present

## 2024-01-01 DIAGNOSIS — K659 Peritonitis, unspecified: Secondary | ICD-10-CM | POA: Diagnosis not present

## 2024-01-01 DIAGNOSIS — N186 End stage renal disease: Secondary | ICD-10-CM | POA: Diagnosis not present

## 2024-01-01 DIAGNOSIS — Z992 Dependence on renal dialysis: Secondary | ICD-10-CM | POA: Diagnosis not present

## 2024-01-01 DIAGNOSIS — Z5181 Encounter for therapeutic drug level monitoring: Secondary | ICD-10-CM | POA: Diagnosis not present

## 2024-01-01 MED ORDER — HYDRALAZINE HCL 50 MG PO TABS: 75.0000 mg | ORAL_TABLET | Freq: Three times a day (TID) | ORAL | 3 refills | Status: DC

## 2024-01-01 MED ORDER — AMLODIPINE BESYLATE 10 MG PO TABS
10.0000 mg | ORAL_TABLET | Freq: Every day | ORAL | 4 refills | Status: AC
  Filled 2024-01-01: qty 90, 90d supply, fill #0

## 2024-01-01 NOTE — Telephone Encounter (Signed)
 [facility] to patient advising per Dr. Shlomo to increase Hydralazine  to 75mg  TID  and check BP twice daily for a week at lunch and dinner and call with results.

## 2024-01-01 NOTE — Addendum Note (Signed)
 Addended by: JANIT GENI CROME on: 01/01/2024 02:57 PM   Modules accepted: Orders

## 2024-01-02 DIAGNOSIS — Z992 Dependence on renal dialysis: Secondary | ICD-10-CM | POA: Diagnosis not present

## 2024-01-02 DIAGNOSIS — N186 End stage renal disease: Secondary | ICD-10-CM | POA: Diagnosis not present

## 2024-01-02 DIAGNOSIS — K659 Peritonitis, unspecified: Secondary | ICD-10-CM | POA: Diagnosis not present

## 2024-01-02 DIAGNOSIS — D631 Anemia in chronic kidney disease: Secondary | ICD-10-CM | POA: Diagnosis not present

## 2024-01-02 DIAGNOSIS — Z5181 Encounter for therapeutic drug level monitoring: Secondary | ICD-10-CM | POA: Diagnosis not present

## 2024-01-02 DIAGNOSIS — N2581 Secondary hyperparathyroidism of renal origin: Secondary | ICD-10-CM | POA: Diagnosis not present

## 2024-01-03 ENCOUNTER — Telehealth: Payer: Self-pay

## 2024-01-03 ENCOUNTER — Other Ambulatory Visit (HOSPITAL_BASED_OUTPATIENT_CLINIC_OR_DEPARTMENT_OTHER): Payer: Self-pay

## 2024-01-03 DIAGNOSIS — N2581 Secondary hyperparathyroidism of renal origin: Secondary | ICD-10-CM | POA: Diagnosis not present

## 2024-01-03 DIAGNOSIS — K659 Peritonitis, unspecified: Secondary | ICD-10-CM | POA: Diagnosis not present

## 2024-01-03 DIAGNOSIS — D631 Anemia in chronic kidney disease: Secondary | ICD-10-CM | POA: Diagnosis not present

## 2024-01-03 DIAGNOSIS — N186 End stage renal disease: Secondary | ICD-10-CM | POA: Diagnosis not present

## 2024-01-03 DIAGNOSIS — Z992 Dependence on renal dialysis: Secondary | ICD-10-CM | POA: Diagnosis not present

## 2024-01-03 DIAGNOSIS — Z5181 Encounter for therapeutic drug level monitoring: Secondary | ICD-10-CM | POA: Diagnosis not present

## 2024-01-03 MED ORDER — HYDRALAZINE HCL 50 MG PO TABS
75.0000 mg | ORAL_TABLET | Freq: Three times a day (TID) | ORAL | 3 refills | Status: AC
  Filled 2024-01-03: qty 135, 30d supply, fill #0
  Filled 2024-03-02: qty 135, 30d supply, fill #1
  Filled 2024-05-03: qty 135, 30d supply, fill #2
  Filled 2024-06-14: qty 135, 30d supply, fill #3

## 2024-01-04 DIAGNOSIS — D631 Anemia in chronic kidney disease: Secondary | ICD-10-CM | POA: Diagnosis not present

## 2024-01-04 DIAGNOSIS — N186 End stage renal disease: Secondary | ICD-10-CM | POA: Diagnosis not present

## 2024-01-04 DIAGNOSIS — K659 Peritonitis, unspecified: Secondary | ICD-10-CM | POA: Diagnosis not present

## 2024-01-04 DIAGNOSIS — Z992 Dependence on renal dialysis: Secondary | ICD-10-CM | POA: Diagnosis not present

## 2024-01-04 DIAGNOSIS — Z5181 Encounter for therapeutic drug level monitoring: Secondary | ICD-10-CM | POA: Diagnosis not present

## 2024-01-04 DIAGNOSIS — F4312 Post-traumatic stress disorder, chronic: Secondary | ICD-10-CM | POA: Diagnosis not present

## 2024-01-04 DIAGNOSIS — N2581 Secondary hyperparathyroidism of renal origin: Secondary | ICD-10-CM | POA: Diagnosis not present

## 2024-01-05 DIAGNOSIS — Z992 Dependence on renal dialysis: Secondary | ICD-10-CM | POA: Diagnosis not present

## 2024-01-05 DIAGNOSIS — N2581 Secondary hyperparathyroidism of renal origin: Secondary | ICD-10-CM | POA: Diagnosis not present

## 2024-01-05 DIAGNOSIS — D631 Anemia in chronic kidney disease: Secondary | ICD-10-CM | POA: Diagnosis not present

## 2024-01-05 DIAGNOSIS — Z5181 Encounter for therapeutic drug level monitoring: Secondary | ICD-10-CM | POA: Diagnosis not present

## 2024-01-05 DIAGNOSIS — K659 Peritonitis, unspecified: Secondary | ICD-10-CM | POA: Diagnosis not present

## 2024-01-05 DIAGNOSIS — N186 End stage renal disease: Secondary | ICD-10-CM | POA: Diagnosis not present

## 2024-01-06 DIAGNOSIS — N2581 Secondary hyperparathyroidism of renal origin: Secondary | ICD-10-CM | POA: Diagnosis not present

## 2024-01-06 DIAGNOSIS — D631 Anemia in chronic kidney disease: Secondary | ICD-10-CM | POA: Diagnosis not present

## 2024-01-06 DIAGNOSIS — Z992 Dependence on renal dialysis: Secondary | ICD-10-CM | POA: Diagnosis not present

## 2024-01-06 DIAGNOSIS — K659 Peritonitis, unspecified: Secondary | ICD-10-CM | POA: Diagnosis not present

## 2024-01-06 DIAGNOSIS — N186 End stage renal disease: Secondary | ICD-10-CM | POA: Diagnosis not present

## 2024-01-06 DIAGNOSIS — Z5181 Encounter for therapeutic drug level monitoring: Secondary | ICD-10-CM | POA: Diagnosis not present

## 2024-01-07 DIAGNOSIS — D631 Anemia in chronic kidney disease: Secondary | ICD-10-CM | POA: Diagnosis not present

## 2024-01-07 DIAGNOSIS — N2581 Secondary hyperparathyroidism of renal origin: Secondary | ICD-10-CM | POA: Diagnosis not present

## 2024-01-07 DIAGNOSIS — Z5181 Encounter for therapeutic drug level monitoring: Secondary | ICD-10-CM | POA: Diagnosis not present

## 2024-01-07 DIAGNOSIS — K659 Peritonitis, unspecified: Secondary | ICD-10-CM | POA: Diagnosis not present

## 2024-01-07 DIAGNOSIS — N186 End stage renal disease: Secondary | ICD-10-CM | POA: Diagnosis not present

## 2024-01-07 DIAGNOSIS — Z992 Dependence on renal dialysis: Secondary | ICD-10-CM | POA: Diagnosis not present

## 2024-01-08 DIAGNOSIS — Z5181 Encounter for therapeutic drug level monitoring: Secondary | ICD-10-CM | POA: Diagnosis not present

## 2024-01-08 DIAGNOSIS — D631 Anemia in chronic kidney disease: Secondary | ICD-10-CM | POA: Diagnosis not present

## 2024-01-08 DIAGNOSIS — N186 End stage renal disease: Secondary | ICD-10-CM | POA: Diagnosis not present

## 2024-01-08 DIAGNOSIS — Z992 Dependence on renal dialysis: Secondary | ICD-10-CM | POA: Diagnosis not present

## 2024-01-08 DIAGNOSIS — N2581 Secondary hyperparathyroidism of renal origin: Secondary | ICD-10-CM | POA: Diagnosis not present

## 2024-01-08 DIAGNOSIS — K659 Peritonitis, unspecified: Secondary | ICD-10-CM | POA: Diagnosis not present

## 2024-01-09 DIAGNOSIS — N2581 Secondary hyperparathyroidism of renal origin: Secondary | ICD-10-CM | POA: Diagnosis not present

## 2024-01-09 DIAGNOSIS — Z5181 Encounter for therapeutic drug level monitoring: Secondary | ICD-10-CM | POA: Diagnosis not present

## 2024-01-09 DIAGNOSIS — Z992 Dependence on renal dialysis: Secondary | ICD-10-CM | POA: Diagnosis not present

## 2024-01-09 DIAGNOSIS — K659 Peritonitis, unspecified: Secondary | ICD-10-CM | POA: Diagnosis not present

## 2024-01-09 DIAGNOSIS — N186 End stage renal disease: Secondary | ICD-10-CM | POA: Diagnosis not present

## 2024-01-09 DIAGNOSIS — D631 Anemia in chronic kidney disease: Secondary | ICD-10-CM | POA: Diagnosis not present

## 2024-01-10 DIAGNOSIS — Z992 Dependence on renal dialysis: Secondary | ICD-10-CM | POA: Diagnosis not present

## 2024-01-10 DIAGNOSIS — Z5181 Encounter for therapeutic drug level monitoring: Secondary | ICD-10-CM | POA: Diagnosis not present

## 2024-01-10 DIAGNOSIS — D631 Anemia in chronic kidney disease: Secondary | ICD-10-CM | POA: Diagnosis not present

## 2024-01-10 DIAGNOSIS — K659 Peritonitis, unspecified: Secondary | ICD-10-CM | POA: Diagnosis not present

## 2024-01-10 DIAGNOSIS — N2581 Secondary hyperparathyroidism of renal origin: Secondary | ICD-10-CM | POA: Diagnosis not present

## 2024-01-10 DIAGNOSIS — N186 End stage renal disease: Secondary | ICD-10-CM | POA: Diagnosis not present

## 2024-01-11 ENCOUNTER — Other Ambulatory Visit (HOSPITAL_BASED_OUTPATIENT_CLINIC_OR_DEPARTMENT_OTHER): Payer: Self-pay

## 2024-01-11 DIAGNOSIS — D631 Anemia in chronic kidney disease: Secondary | ICD-10-CM | POA: Diagnosis not present

## 2024-01-11 DIAGNOSIS — Z992 Dependence on renal dialysis: Secondary | ICD-10-CM | POA: Diagnosis not present

## 2024-01-11 DIAGNOSIS — N186 End stage renal disease: Secondary | ICD-10-CM | POA: Diagnosis not present

## 2024-01-11 DIAGNOSIS — K659 Peritonitis, unspecified: Secondary | ICD-10-CM | POA: Diagnosis not present

## 2024-01-11 DIAGNOSIS — N2581 Secondary hyperparathyroidism of renal origin: Secondary | ICD-10-CM | POA: Diagnosis not present

## 2024-01-11 DIAGNOSIS — Z5181 Encounter for therapeutic drug level monitoring: Secondary | ICD-10-CM | POA: Diagnosis not present

## 2024-01-11 MED ORDER — TOPIRAMATE 25 MG PO TABS
25.0000 mg | ORAL_TABLET | Freq: Every day | ORAL | 0 refills | Status: AC
  Filled 2024-01-11: qty 30, 30d supply, fill #0

## 2024-01-12 DIAGNOSIS — K659 Peritonitis, unspecified: Secondary | ICD-10-CM | POA: Diagnosis not present

## 2024-01-12 DIAGNOSIS — Z5181 Encounter for therapeutic drug level monitoring: Secondary | ICD-10-CM | POA: Diagnosis not present

## 2024-01-12 DIAGNOSIS — N186 End stage renal disease: Secondary | ICD-10-CM | POA: Diagnosis not present

## 2024-01-12 DIAGNOSIS — Z992 Dependence on renal dialysis: Secondary | ICD-10-CM | POA: Diagnosis not present

## 2024-01-12 DIAGNOSIS — N2581 Secondary hyperparathyroidism of renal origin: Secondary | ICD-10-CM | POA: Diagnosis not present

## 2024-01-12 DIAGNOSIS — D631 Anemia in chronic kidney disease: Secondary | ICD-10-CM | POA: Diagnosis not present

## 2024-01-13 DIAGNOSIS — Z5181 Encounter for therapeutic drug level monitoring: Secondary | ICD-10-CM | POA: Diagnosis not present

## 2024-01-13 DIAGNOSIS — D631 Anemia in chronic kidney disease: Secondary | ICD-10-CM | POA: Diagnosis not present

## 2024-01-13 DIAGNOSIS — N2581 Secondary hyperparathyroidism of renal origin: Secondary | ICD-10-CM | POA: Diagnosis not present

## 2024-01-13 DIAGNOSIS — K659 Peritonitis, unspecified: Secondary | ICD-10-CM | POA: Diagnosis not present

## 2024-01-13 DIAGNOSIS — Z992 Dependence on renal dialysis: Secondary | ICD-10-CM | POA: Diagnosis not present

## 2024-01-13 DIAGNOSIS — N186 End stage renal disease: Secondary | ICD-10-CM | POA: Diagnosis not present

## 2024-01-14 DIAGNOSIS — Z992 Dependence on renal dialysis: Secondary | ICD-10-CM | POA: Diagnosis not present

## 2024-01-14 DIAGNOSIS — N186 End stage renal disease: Secondary | ICD-10-CM | POA: Diagnosis not present

## 2024-01-14 DIAGNOSIS — N2581 Secondary hyperparathyroidism of renal origin: Secondary | ICD-10-CM | POA: Diagnosis not present

## 2024-01-14 DIAGNOSIS — D631 Anemia in chronic kidney disease: Secondary | ICD-10-CM | POA: Diagnosis not present

## 2024-01-14 DIAGNOSIS — K659 Peritonitis, unspecified: Secondary | ICD-10-CM | POA: Diagnosis not present

## 2024-01-14 DIAGNOSIS — E1122 Type 2 diabetes mellitus with diabetic chronic kidney disease: Secondary | ICD-10-CM | POA: Diagnosis not present

## 2024-01-14 DIAGNOSIS — Z5181 Encounter for therapeutic drug level monitoring: Secondary | ICD-10-CM | POA: Diagnosis not present

## 2024-01-15 DIAGNOSIS — Z5181 Encounter for therapeutic drug level monitoring: Secondary | ICD-10-CM | POA: Diagnosis not present

## 2024-01-15 DIAGNOSIS — N2581 Secondary hyperparathyroidism of renal origin: Secondary | ICD-10-CM | POA: Diagnosis not present

## 2024-01-15 DIAGNOSIS — Z992 Dependence on renal dialysis: Secondary | ICD-10-CM | POA: Diagnosis not present

## 2024-01-15 DIAGNOSIS — N186 End stage renal disease: Secondary | ICD-10-CM | POA: Diagnosis not present

## 2024-01-15 DIAGNOSIS — D631 Anemia in chronic kidney disease: Secondary | ICD-10-CM | POA: Diagnosis not present

## 2024-01-16 DIAGNOSIS — N2581 Secondary hyperparathyroidism of renal origin: Secondary | ICD-10-CM | POA: Diagnosis not present

## 2024-01-16 DIAGNOSIS — Z992 Dependence on renal dialysis: Secondary | ICD-10-CM | POA: Diagnosis not present

## 2024-01-16 DIAGNOSIS — Z5181 Encounter for therapeutic drug level monitoring: Secondary | ICD-10-CM | POA: Diagnosis not present

## 2024-01-16 DIAGNOSIS — N186 End stage renal disease: Secondary | ICD-10-CM | POA: Diagnosis not present

## 2024-01-16 DIAGNOSIS — D631 Anemia in chronic kidney disease: Secondary | ICD-10-CM | POA: Diagnosis not present

## 2024-01-17 DIAGNOSIS — D631 Anemia in chronic kidney disease: Secondary | ICD-10-CM | POA: Diagnosis not present

## 2024-01-17 DIAGNOSIS — Z992 Dependence on renal dialysis: Secondary | ICD-10-CM | POA: Diagnosis not present

## 2024-01-17 DIAGNOSIS — Z5181 Encounter for therapeutic drug level monitoring: Secondary | ICD-10-CM | POA: Diagnosis not present

## 2024-01-17 DIAGNOSIS — N186 End stage renal disease: Secondary | ICD-10-CM | POA: Diagnosis not present

## 2024-01-17 DIAGNOSIS — N2581 Secondary hyperparathyroidism of renal origin: Secondary | ICD-10-CM | POA: Diagnosis not present

## 2024-01-18 DIAGNOSIS — E1122 Type 2 diabetes mellitus with diabetic chronic kidney disease: Secondary | ICD-10-CM | POA: Diagnosis not present

## 2024-01-18 DIAGNOSIS — N189 Chronic kidney disease, unspecified: Secondary | ICD-10-CM | POA: Diagnosis not present

## 2024-01-18 DIAGNOSIS — D631 Anemia in chronic kidney disease: Secondary | ICD-10-CM | POA: Diagnosis not present

## 2024-01-18 DIAGNOSIS — Z5181 Encounter for therapeutic drug level monitoring: Secondary | ICD-10-CM | POA: Diagnosis not present

## 2024-01-18 DIAGNOSIS — Z1330 Encounter for screening examination for mental health and behavioral disorders, unspecified: Secondary | ICD-10-CM | POA: Diagnosis not present

## 2024-01-18 DIAGNOSIS — T829XXD Unspecified complication of cardiac and vascular prosthetic device, implant and graft, subsequent encounter: Secondary | ICD-10-CM | POA: Diagnosis not present

## 2024-01-18 DIAGNOSIS — I120 Hypertensive chronic kidney disease with stage 5 chronic kidney disease or end stage renal disease: Secondary | ICD-10-CM | POA: Diagnosis not present

## 2024-01-18 DIAGNOSIS — N2581 Secondary hyperparathyroidism of renal origin: Secondary | ICD-10-CM | POA: Diagnosis not present

## 2024-01-18 DIAGNOSIS — Z992 Dependence on renal dialysis: Secondary | ICD-10-CM | POA: Diagnosis not present

## 2024-01-18 DIAGNOSIS — J45909 Unspecified asthma, uncomplicated: Secondary | ICD-10-CM | POA: Diagnosis not present

## 2024-01-18 DIAGNOSIS — N186 End stage renal disease: Secondary | ICD-10-CM | POA: Diagnosis not present

## 2024-01-19 DIAGNOSIS — N2581 Secondary hyperparathyroidism of renal origin: Secondary | ICD-10-CM | POA: Diagnosis not present

## 2024-01-19 DIAGNOSIS — Z5181 Encounter for therapeutic drug level monitoring: Secondary | ICD-10-CM | POA: Diagnosis not present

## 2024-01-19 DIAGNOSIS — D631 Anemia in chronic kidney disease: Secondary | ICD-10-CM | POA: Diagnosis not present

## 2024-01-19 DIAGNOSIS — Z992 Dependence on renal dialysis: Secondary | ICD-10-CM | POA: Diagnosis not present

## 2024-01-19 DIAGNOSIS — N186 End stage renal disease: Secondary | ICD-10-CM | POA: Diagnosis not present

## 2024-01-20 DIAGNOSIS — N2581 Secondary hyperparathyroidism of renal origin: Secondary | ICD-10-CM | POA: Diagnosis not present

## 2024-01-20 DIAGNOSIS — N186 End stage renal disease: Secondary | ICD-10-CM | POA: Diagnosis not present

## 2024-01-20 DIAGNOSIS — Z992 Dependence on renal dialysis: Secondary | ICD-10-CM | POA: Diagnosis not present

## 2024-01-20 DIAGNOSIS — Z5181 Encounter for therapeutic drug level monitoring: Secondary | ICD-10-CM | POA: Diagnosis not present

## 2024-01-20 DIAGNOSIS — D631 Anemia in chronic kidney disease: Secondary | ICD-10-CM | POA: Diagnosis not present

## 2024-01-21 DIAGNOSIS — Z992 Dependence on renal dialysis: Secondary | ICD-10-CM | POA: Diagnosis not present

## 2024-01-21 DIAGNOSIS — Z5181 Encounter for therapeutic drug level monitoring: Secondary | ICD-10-CM | POA: Diagnosis not present

## 2024-01-21 DIAGNOSIS — N186 End stage renal disease: Secondary | ICD-10-CM | POA: Diagnosis not present

## 2024-01-21 DIAGNOSIS — N2581 Secondary hyperparathyroidism of renal origin: Secondary | ICD-10-CM | POA: Diagnosis not present

## 2024-01-21 DIAGNOSIS — D631 Anemia in chronic kidney disease: Secondary | ICD-10-CM | POA: Diagnosis not present

## 2024-01-22 DIAGNOSIS — N2581 Secondary hyperparathyroidism of renal origin: Secondary | ICD-10-CM | POA: Diagnosis not present

## 2024-01-22 DIAGNOSIS — Z992 Dependence on renal dialysis: Secondary | ICD-10-CM | POA: Diagnosis not present

## 2024-01-22 DIAGNOSIS — Z6841 Body Mass Index (BMI) 40.0 and over, adult: Secondary | ICD-10-CM | POA: Diagnosis not present

## 2024-01-22 DIAGNOSIS — E7800 Pure hypercholesterolemia, unspecified: Secondary | ICD-10-CM | POA: Diagnosis not present

## 2024-01-22 DIAGNOSIS — I2510 Atherosclerotic heart disease of native coronary artery without angina pectoris: Secondary | ICD-10-CM | POA: Diagnosis not present

## 2024-01-22 DIAGNOSIS — D631 Anemia in chronic kidney disease: Secondary | ICD-10-CM | POA: Diagnosis not present

## 2024-01-22 DIAGNOSIS — K760 Fatty (change of) liver, not elsewhere classified: Secondary | ICD-10-CM | POA: Diagnosis not present

## 2024-01-22 DIAGNOSIS — Z954 Presence of other heart-valve replacement: Secondary | ICD-10-CM | POA: Diagnosis not present

## 2024-01-22 DIAGNOSIS — E1122 Type 2 diabetes mellitus with diabetic chronic kidney disease: Secondary | ICD-10-CM | POA: Diagnosis not present

## 2024-01-22 DIAGNOSIS — Z5181 Encounter for therapeutic drug level monitoring: Secondary | ICD-10-CM | POA: Diagnosis not present

## 2024-01-22 DIAGNOSIS — N186 End stage renal disease: Secondary | ICD-10-CM | POA: Diagnosis not present

## 2024-01-22 DIAGNOSIS — I10 Essential (primary) hypertension: Secondary | ICD-10-CM | POA: Diagnosis not present

## 2024-01-23 DIAGNOSIS — N186 End stage renal disease: Secondary | ICD-10-CM | POA: Diagnosis not present

## 2024-01-23 DIAGNOSIS — Z992 Dependence on renal dialysis: Secondary | ICD-10-CM | POA: Diagnosis not present

## 2024-01-23 DIAGNOSIS — D631 Anemia in chronic kidney disease: Secondary | ICD-10-CM | POA: Diagnosis not present

## 2024-01-23 DIAGNOSIS — N2581 Secondary hyperparathyroidism of renal origin: Secondary | ICD-10-CM | POA: Diagnosis not present

## 2024-01-23 DIAGNOSIS — Z5181 Encounter for therapeutic drug level monitoring: Secondary | ICD-10-CM | POA: Diagnosis not present

## 2024-01-24 DIAGNOSIS — Z952 Presence of prosthetic heart valve: Secondary | ICD-10-CM | POA: Diagnosis not present

## 2024-01-24 DIAGNOSIS — Z5181 Encounter for therapeutic drug level monitoring: Secondary | ICD-10-CM | POA: Diagnosis not present

## 2024-01-24 DIAGNOSIS — N186 End stage renal disease: Secondary | ICD-10-CM | POA: Diagnosis not present

## 2024-01-24 DIAGNOSIS — D631 Anemia in chronic kidney disease: Secondary | ICD-10-CM | POA: Diagnosis not present

## 2024-01-24 DIAGNOSIS — Z992 Dependence on renal dialysis: Secondary | ICD-10-CM | POA: Diagnosis not present

## 2024-01-24 DIAGNOSIS — N2581 Secondary hyperparathyroidism of renal origin: Secondary | ICD-10-CM | POA: Diagnosis not present

## 2024-01-25 DIAGNOSIS — Z5181 Encounter for therapeutic drug level monitoring: Secondary | ICD-10-CM | POA: Diagnosis not present

## 2024-01-25 DIAGNOSIS — N2581 Secondary hyperparathyroidism of renal origin: Secondary | ICD-10-CM | POA: Diagnosis not present

## 2024-01-25 DIAGNOSIS — D631 Anemia in chronic kidney disease: Secondary | ICD-10-CM | POA: Diagnosis not present

## 2024-01-25 DIAGNOSIS — Z992 Dependence on renal dialysis: Secondary | ICD-10-CM | POA: Diagnosis not present

## 2024-01-25 DIAGNOSIS — N186 End stage renal disease: Secondary | ICD-10-CM | POA: Diagnosis not present

## 2024-01-26 DIAGNOSIS — Z5181 Encounter for therapeutic drug level monitoring: Secondary | ICD-10-CM | POA: Diagnosis not present

## 2024-01-26 DIAGNOSIS — D631 Anemia in chronic kidney disease: Secondary | ICD-10-CM | POA: Diagnosis not present

## 2024-01-26 DIAGNOSIS — N186 End stage renal disease: Secondary | ICD-10-CM | POA: Diagnosis not present

## 2024-01-26 DIAGNOSIS — N2581 Secondary hyperparathyroidism of renal origin: Secondary | ICD-10-CM | POA: Diagnosis not present

## 2024-01-26 DIAGNOSIS — Z992 Dependence on renal dialysis: Secondary | ICD-10-CM | POA: Diagnosis not present

## 2024-01-27 DIAGNOSIS — Z992 Dependence on renal dialysis: Secondary | ICD-10-CM | POA: Diagnosis not present

## 2024-01-27 DIAGNOSIS — Z5181 Encounter for therapeutic drug level monitoring: Secondary | ICD-10-CM | POA: Diagnosis not present

## 2024-01-27 DIAGNOSIS — N2581 Secondary hyperparathyroidism of renal origin: Secondary | ICD-10-CM | POA: Diagnosis not present

## 2024-01-27 DIAGNOSIS — N186 End stage renal disease: Secondary | ICD-10-CM | POA: Diagnosis not present

## 2024-01-27 DIAGNOSIS — D631 Anemia in chronic kidney disease: Secondary | ICD-10-CM | POA: Diagnosis not present

## 2024-01-28 DIAGNOSIS — D631 Anemia in chronic kidney disease: Secondary | ICD-10-CM | POA: Diagnosis not present

## 2024-01-28 DIAGNOSIS — N186 End stage renal disease: Secondary | ICD-10-CM | POA: Diagnosis not present

## 2024-01-28 DIAGNOSIS — N2581 Secondary hyperparathyroidism of renal origin: Secondary | ICD-10-CM | POA: Diagnosis not present

## 2024-01-28 DIAGNOSIS — Z5181 Encounter for therapeutic drug level monitoring: Secondary | ICD-10-CM | POA: Diagnosis not present

## 2024-01-28 DIAGNOSIS — Z992 Dependence on renal dialysis: Secondary | ICD-10-CM | POA: Diagnosis not present

## 2024-01-29 ENCOUNTER — Other Ambulatory Visit: Payer: Self-pay

## 2024-01-29 ENCOUNTER — Other Ambulatory Visit (HOSPITAL_BASED_OUTPATIENT_CLINIC_OR_DEPARTMENT_OTHER): Payer: Self-pay

## 2024-01-29 DIAGNOSIS — K659 Peritonitis, unspecified: Secondary | ICD-10-CM | POA: Diagnosis not present

## 2024-01-29 DIAGNOSIS — J45909 Unspecified asthma, uncomplicated: Secondary | ICD-10-CM | POA: Diagnosis not present

## 2024-01-29 DIAGNOSIS — Z992 Dependence on renal dialysis: Secondary | ICD-10-CM | POA: Diagnosis not present

## 2024-01-29 DIAGNOSIS — D631 Anemia in chronic kidney disease: Secondary | ICD-10-CM | POA: Diagnosis not present

## 2024-01-29 DIAGNOSIS — N186 End stage renal disease: Secondary | ICD-10-CM | POA: Diagnosis not present

## 2024-01-29 DIAGNOSIS — G4733 Obstructive sleep apnea (adult) (pediatric): Secondary | ICD-10-CM | POA: Diagnosis not present

## 2024-01-29 DIAGNOSIS — Z5181 Encounter for therapeutic drug level monitoring: Secondary | ICD-10-CM | POA: Diagnosis not present

## 2024-01-29 DIAGNOSIS — N2581 Secondary hyperparathyroidism of renal origin: Secondary | ICD-10-CM | POA: Diagnosis not present

## 2024-01-29 MED ORDER — ONDANSETRON HCL 4 MG PO TABS
4.0000 mg | ORAL_TABLET | Freq: Three times a day (TID) | ORAL | 0 refills | Status: AC | PRN
  Filled 2024-01-29 (×2): qty 12, 4d supply, fill #0

## 2024-01-29 MED ORDER — HYDROCODONE-ACETAMINOPHEN 5-325 MG PO TABS
1.0000 | ORAL_TABLET | Freq: Four times a day (QID) | ORAL | 0 refills | Status: AC | PRN
  Filled 2024-01-29: qty 12, 3d supply, fill #0
  Filled 2024-01-29: qty 12, 4d supply, fill #0

## 2024-01-29 MED ORDER — METHOCARBAMOL 500 MG PO TABS
500.0000 mg | ORAL_TABLET | Freq: Three times a day (TID) | ORAL | 0 refills | Status: AC
  Filled 2024-01-29 (×2): qty 12, 4d supply, fill #0

## 2024-01-30 DIAGNOSIS — N186 End stage renal disease: Secondary | ICD-10-CM | POA: Diagnosis not present

## 2024-01-30 DIAGNOSIS — Z5181 Encounter for therapeutic drug level monitoring: Secondary | ICD-10-CM | POA: Diagnosis not present

## 2024-01-30 DIAGNOSIS — D631 Anemia in chronic kidney disease: Secondary | ICD-10-CM | POA: Diagnosis not present

## 2024-01-30 DIAGNOSIS — N2581 Secondary hyperparathyroidism of renal origin: Secondary | ICD-10-CM | POA: Diagnosis not present

## 2024-01-30 DIAGNOSIS — Z992 Dependence on renal dialysis: Secondary | ICD-10-CM | POA: Diagnosis not present

## 2024-01-31 DIAGNOSIS — Z992 Dependence on renal dialysis: Secondary | ICD-10-CM | POA: Diagnosis not present

## 2024-01-31 DIAGNOSIS — N2581 Secondary hyperparathyroidism of renal origin: Secondary | ICD-10-CM | POA: Diagnosis not present

## 2024-01-31 DIAGNOSIS — D631 Anemia in chronic kidney disease: Secondary | ICD-10-CM | POA: Diagnosis not present

## 2024-01-31 DIAGNOSIS — N186 End stage renal disease: Secondary | ICD-10-CM | POA: Diagnosis not present

## 2024-01-31 DIAGNOSIS — Z5181 Encounter for therapeutic drug level monitoring: Secondary | ICD-10-CM | POA: Diagnosis not present

## 2024-02-01 ENCOUNTER — Other Ambulatory Visit: Payer: Self-pay | Admitting: Nephrology

## 2024-02-01 ENCOUNTER — Ambulatory Visit
Admission: RE | Admit: 2024-02-01 | Discharge: 2024-02-01 | Disposition: A | Discharge status: Home / Self Care | Source: Ambulatory Visit | Attending: Nephrology | Admitting: Nephrology

## 2024-02-01 DIAGNOSIS — Z4902 Encounter for fitting and adjustment of peritoneal dialysis catheter: Secondary | ICD-10-CM

## 2024-02-01 DIAGNOSIS — Z992 Dependence on renal dialysis: Secondary | ICD-10-CM | POA: Diagnosis not present

## 2024-02-01 DIAGNOSIS — N186 End stage renal disease: Secondary | ICD-10-CM | POA: Diagnosis not present

## 2024-02-01 DIAGNOSIS — D631 Anemia in chronic kidney disease: Secondary | ICD-10-CM | POA: Diagnosis not present

## 2024-02-01 DIAGNOSIS — N2581 Secondary hyperparathyroidism of renal origin: Secondary | ICD-10-CM | POA: Diagnosis not present

## 2024-02-01 DIAGNOSIS — T85611A Breakdown (mechanical) of intraperitoneal dialysis catheter, initial encounter: Secondary | ICD-10-CM | POA: Diagnosis not present

## 2024-02-01 DIAGNOSIS — Z5181 Encounter for therapeutic drug level monitoring: Secondary | ICD-10-CM | POA: Diagnosis not present

## 2024-02-02 DIAGNOSIS — D631 Anemia in chronic kidney disease: Secondary | ICD-10-CM | POA: Diagnosis not present

## 2024-02-02 DIAGNOSIS — N186 End stage renal disease: Secondary | ICD-10-CM | POA: Diagnosis not present

## 2024-02-02 DIAGNOSIS — Z992 Dependence on renal dialysis: Secondary | ICD-10-CM | POA: Diagnosis not present

## 2024-02-02 DIAGNOSIS — N2581 Secondary hyperparathyroidism of renal origin: Secondary | ICD-10-CM | POA: Diagnosis not present

## 2024-02-02 DIAGNOSIS — Z5181 Encounter for therapeutic drug level monitoring: Secondary | ICD-10-CM | POA: Diagnosis not present

## 2024-02-03 DIAGNOSIS — Z5181 Encounter for therapeutic drug level monitoring: Secondary | ICD-10-CM | POA: Diagnosis not present

## 2024-02-03 DIAGNOSIS — N186 End stage renal disease: Secondary | ICD-10-CM | POA: Diagnosis not present

## 2024-02-03 DIAGNOSIS — N2581 Secondary hyperparathyroidism of renal origin: Secondary | ICD-10-CM | POA: Diagnosis not present

## 2024-02-03 DIAGNOSIS — D631 Anemia in chronic kidney disease: Secondary | ICD-10-CM | POA: Diagnosis not present

## 2024-02-03 DIAGNOSIS — I509 Heart failure, unspecified: Secondary | ICD-10-CM | POA: Diagnosis not present

## 2024-02-03 DIAGNOSIS — Z992 Dependence on renal dialysis: Secondary | ICD-10-CM | POA: Diagnosis not present

## 2024-02-04 DIAGNOSIS — D631 Anemia in chronic kidney disease: Secondary | ICD-10-CM | POA: Diagnosis not present

## 2024-02-04 DIAGNOSIS — N186 End stage renal disease: Secondary | ICD-10-CM | POA: Diagnosis not present

## 2024-02-04 DIAGNOSIS — Z992 Dependence on renal dialysis: Secondary | ICD-10-CM | POA: Diagnosis not present

## 2024-02-04 DIAGNOSIS — N2581 Secondary hyperparathyroidism of renal origin: Secondary | ICD-10-CM | POA: Diagnosis not present

## 2024-02-04 DIAGNOSIS — Z5181 Encounter for therapeutic drug level monitoring: Secondary | ICD-10-CM | POA: Diagnosis not present

## 2024-02-05 ENCOUNTER — Other Ambulatory Visit (HOSPITAL_BASED_OUTPATIENT_CLINIC_OR_DEPARTMENT_OTHER): Payer: Self-pay

## 2024-02-05 DIAGNOSIS — Z5181 Encounter for therapeutic drug level monitoring: Secondary | ICD-10-CM | POA: Diagnosis not present

## 2024-02-05 DIAGNOSIS — N186 End stage renal disease: Secondary | ICD-10-CM | POA: Diagnosis not present

## 2024-02-05 DIAGNOSIS — Z992 Dependence on renal dialysis: Secondary | ICD-10-CM | POA: Diagnosis not present

## 2024-02-05 DIAGNOSIS — D631 Anemia in chronic kidney disease: Secondary | ICD-10-CM | POA: Diagnosis not present

## 2024-02-05 DIAGNOSIS — N2581 Secondary hyperparathyroidism of renal origin: Secondary | ICD-10-CM | POA: Diagnosis not present

## 2024-02-05 MED ORDER — LACTULOSE 10 GM/15ML PO SOLN
Freq: Three times a day (TID) | ORAL | 0 refills | Status: AC
  Filled 2024-02-05: qty 300, 3d supply, fill #0

## 2024-02-06 DIAGNOSIS — G4733 Obstructive sleep apnea (adult) (pediatric): Secondary | ICD-10-CM | POA: Diagnosis not present

## 2024-02-06 DIAGNOSIS — I455 Other specified heart block: Secondary | ICD-10-CM | POA: Diagnosis not present

## 2024-02-06 DIAGNOSIS — I493 Ventricular premature depolarization: Secondary | ICD-10-CM | POA: Diagnosis not present

## 2024-02-06 DIAGNOSIS — I4589 Other specified conduction disorders: Secondary | ICD-10-CM | POA: Diagnosis not present

## 2024-02-06 DIAGNOSIS — I491 Atrial premature depolarization: Secondary | ICD-10-CM | POA: Diagnosis not present

## 2024-02-06 DIAGNOSIS — I10 Essential (primary) hypertension: Secondary | ICD-10-CM | POA: Diagnosis not present

## 2024-02-08 ENCOUNTER — Encounter (HOSPITAL_COMMUNITY)
Admission: RE | Disposition: A | Discharge status: Home / Self Care | Payer: Self-pay | Source: Home / Self Care | Attending: Vascular Surgery

## 2024-02-08 ENCOUNTER — Encounter (HOSPITAL_COMMUNITY): Payer: Self-pay | Admitting: Vascular Surgery

## 2024-02-08 ENCOUNTER — Other Ambulatory Visit: Payer: Self-pay

## 2024-02-08 ENCOUNTER — Ambulatory Visit (HOSPITAL_COMMUNITY)
Admission: RE | Admit: 2024-02-08 | Discharge: 2024-02-08 | Disposition: A | Discharge status: Home / Self Care | Attending: Vascular Surgery | Admitting: Vascular Surgery

## 2024-02-08 DIAGNOSIS — Z5181 Encounter for therapeutic drug level monitoring: Secondary | ICD-10-CM | POA: Diagnosis not present

## 2024-02-08 DIAGNOSIS — Z87891 Personal history of nicotine dependence: Secondary | ICD-10-CM | POA: Diagnosis not present

## 2024-02-08 DIAGNOSIS — I132 Hypertensive heart and chronic kidney disease with heart failure and with stage 5 chronic kidney disease, or end stage renal disease: Secondary | ICD-10-CM | POA: Insufficient documentation

## 2024-02-08 DIAGNOSIS — N186 End stage renal disease: Secondary | ICD-10-CM | POA: Diagnosis not present

## 2024-02-08 DIAGNOSIS — N2581 Secondary hyperparathyroidism of renal origin: Secondary | ICD-10-CM | POA: Diagnosis not present

## 2024-02-08 DIAGNOSIS — Z992 Dependence on renal dialysis: Secondary | ICD-10-CM | POA: Diagnosis not present

## 2024-02-08 DIAGNOSIS — I509 Heart failure, unspecified: Secondary | ICD-10-CM | POA: Insufficient documentation

## 2024-02-08 HISTORY — PX: DIALYSIS/PERMA CATHETER INSERTION: CATH118288

## 2024-02-08 SURGERY — DIALYSIS/PERMA CATHETER INSERTION
Anesthesia: LOCAL | Site: Chest

## 2024-02-08 MED ORDER — LIDOCAINE HCL (PF) 1 % IJ SOLN
INTRAMUSCULAR | Status: DC | PRN
  Administered 2024-02-08: 5 mL via SUBCUTANEOUS
  Administered 2024-02-08: 10 mL via SUBCUTANEOUS

## 2024-02-08 MED ORDER — HEPARIN (PORCINE) IN NACL 1000-0.9 UT/500ML-% IV SOLN
INTRAVENOUS | Status: DC | PRN
  Administered 2024-02-08: 500 mL

## 2024-02-08 MED ORDER — HEPARIN SODIUM (PORCINE) 1000 UNIT/ML IJ SOLN
INTRAMUSCULAR | Status: DC | PRN
  Administered 2024-02-08 (×2): 1600 [IU] via INTRAVENOUS

## 2024-02-08 MED ORDER — LIDOCAINE HCL (PF) 1 % IJ SOLN
INTRAMUSCULAR | Status: AC
  Filled 2024-02-08: qty 30

## 2024-02-08 MED ORDER — HEPARIN SODIUM (PORCINE) 1000 UNIT/ML IJ SOLN
INTRAMUSCULAR | Status: AC
  Filled 2024-02-08: qty 10

## 2024-02-08 SURGICAL SUPPLY — 4 items
CATH PALINDROME-P 19CM W/VT (CATHETERS) IMPLANT
KIT MICROPUNCTURE NIT STIFF (SHEATH) IMPLANT
SHEATH PROBE COVER 6X72 (BAG) IMPLANT
TRAY PV CATH (CUSTOM PROCEDURE TRAY) ×2 IMPLANT

## 2024-02-08 NOTE — Op Note (Signed)
    Patient name: Adrian Frye MRN: [id number] DOB: [birth year] Sex: male  02/08/2024 Pre-operative Diagnosis: end-stage renal disease, need for dialysis access Post-operative diagnosis:  Same Surgeon:  Penne BROCKS. Sheree, MD Procedure Performed:  Placement of right IJ 19 cm tunneled dialysis catheter using ultrasound and fluoroscopic guidance  Indications: 53 year old male with end-stage renal disease previously was on dialysis via peritoneal dialysis catheter which has had difficulty with recent replacement and currently is nonfunctioning.  He does have a basilic vein fistula in the left upper extremity that was never transposed.  He is now indicated for tunneled dialysis catheter for temporary hemodialysis with plans for return to peritoneal dialysis.  Findings: The right IJ measured approximately 5 cm.  The catheter was placed to the SVC atrial junction and flushed and withdrew heparinized saline at completion easily.  If peritoneal dialysis catheter is no longer an option patient will need transposition of his left arm basilic vein fistula.  He can call to schedule this without further evaluation.   Procedure:  The patient was identified in the holding area and taken to the heart and vascular procedure room.  The patient was then placed supine on the table and prepped and draped in the usual sterile fashion.  A time out was called.  Ultrasound was used to evaluate the internal jugular vein which was very large and patent and compressible.  The area around the IJ was anesthetized 1% lidocaine  as was the expected catheter course.  Then cannulated the right IJ with a micropuncture needle followed by wire and a sheath using direct ultrasound visualization and an ultrasound image was saved the permanent record.  The wire was placed into the left ventricle I could not steer this into the IVC.  I then tunneled a 19 cm catheter from a counterincision.  I placed a J-wire and again attempted to select  the IVC.  I was unable to do this site then dilated the wire tract and placed the introducer sheath using fluoroscopic guidance.  Catheter was then placed in the SVC atrial junction.  It was affixed to the skin with nylon suture.  Did flush and withdraw heparinized saline easily and was locked with 1.6 cc of concentrated heparin  in both ports.  The neck incision was closed with Monocryl.  Dermabond is placed at both sites followed by sterile dressing.  He tolerated procedure without any complication.+     Annlouise Gerety C. Sheree, MD [facility] Office: [phone] Pager: [phone]

## 2024-02-08 NOTE — H&P (Signed)
 H+P  History of Present Illness: This is a 53 y.o. male end-stage renal disease recently on dialysis via peritoneal dialysis.  Now planning to return to hemodialysis at least temporarily and here today for catheter.  He does have a history of a basilic vein fistula in the left upper arm performed 6 years ago was never superficialized.  He does not have any left upper extremity symptoms.  Past Medical History:  Diagnosis Date   Allergic rhinitis    Allergy    Anemia    Asthma    as a child   Bacteremia    Benign essential hypertension    Carotid artery stenosis, asymptomatic, left    1-39% left by dopplers 10/2022   CHF (congestive heart failure) (HCC)    Dilated idiopathic cardiomyopathy (HCC)    now resoved with EF 55% by echo 2011   Edema    Encounter for blood transfusion    Endocarditis    ESRD (end stage renal disease) (HCC)    ESRD on peritoneal dialysis (HCC)    Heart disease    Hyperlipidemia    Hyperparathyroidism, secondary    Hypertension    Morbid obesity (HCC)    Sleep apnea    c-pap   UGI bleed 2011   ASA    Past Surgical History:  Procedure Laterality Date   ACHILLES TENDON REPAIR     ruptured; right   AV FISTULA PLACEMENT Left 11/03/2017   Procedure: ARTERIOVENOUS (AV) FISTULA CREATION  left upper arm;  Surgeon: Oris Krystal FALCON, MD;  Location: [facility] OR;  Service: Vascular;  Laterality: Left;   COLONOSCOPY     COLONOSCOPY WITH PROPOFOL  N/A 08/11/2022   Procedure: COLONOSCOPY WITH PROPOFOL ;  Surgeon: Abran Norleen SAILOR, MD;  Location: [facility];  Service: Gastroenterology;  Laterality: N/A;   DIALYSIS FISTULA CREATION     INSERTION OF DIALYSIS CATHETER N/A 11/03/2017   Procedure: INSERTION OF DIALYSIS CATHETER - RIGHT INTERNAL JUGULAR PLACEMENT;  Surgeon: Oris Krystal FALCON, MD;  Location: [facility] OR;  Service: Vascular;  Laterality: N/A;   IR FLUORO GUIDE CV LINE LEFT  01/24/2020   IR REMOVAL TUN CV CATH W/O FL  01/21/2020   IR REMOVAL TUN CV CATH W/O FL  03/06/2020    IR US  GUIDE VASC ACCESS LEFT  01/24/2020   KIDNEY FAILURE     LAPAROSCOPIC GASTROTOMY W/ REPAIR OF ULCER     PLEURAL SCARIFICATION     left, football trauma-chest tube   STOMACH SURGERY     TEE WITHOUT CARDIOVERSION N/A 01/22/2020   Procedure: TRANSESOPHAGEAL ECHOCARDIOGRAM (TEE);  Surgeon: Mona Vinie BROCKS, MD;  Location: [facility] ENDOSCOPY;  Service: Cardiovascular;  Laterality: N/A;   TEE WITHOUT CARDIOVERSION N/A 10/31/2022   Procedure: TRANSESOPHAGEAL ECHOCARDIOGRAM;  Surgeon: Shlomo Wilbert SAUNDERS, MD;  Location: [facility] INVASIVE CV LAB;  Service: Cardiovascular;  Laterality: N/A;   TONSILLECTOMY     TONSILLECTOMY     UPPER GASTROINTESTINAL ENDOSCOPY      Allergies  Allergen Reactions   Ace Inhibitors Anaphylaxis and Swelling   Lisinopril Swelling and Anaphylaxis    Angioedema  Other reaction(s): Angioedema (ALLERGY/intolerance)   Losartan Swelling and Anaphylaxis   Cat Dander Rash    Prior to Admission medications   Medication Sig Start Date End Date Taking? Authorizing Provider  albuterol  (VENTOLIN  HFA) 108 (90 Base) MCG/ACT inhaler TAKE 2 PUFFS BY MOUTH EVERY 6 HOURS AS NEEDED FOR WHEEZE OR SHORTNESS OF BREATH Patient taking differently: Inhale 3 puffs into the lungs daily as needed  for wheezing or shortness of breath. 09/14/20   Henson, Vickie L, NP-C  amLODipine  (NORVASC ) 10 MG tablet Take 1 tablet (10 mg total) by mouth daily. 01/01/24     amoxicillin  (AMOXIL ) 500 MG capsule Take 4 capsules (2,000 mg total) by mouth as needed (Take dose 30-60 minutes before dental appointments for endocarditis prophylaxis-heart valve infection prevention) 12/15/23     aspirin  81 MG chewable tablet Chew 1 tablet (81 mg total) by mouth daily. 12/15/23     aspirin  EC 81 MG tablet Take 1 tablet (81 mg total) by mouth daily. Swallow whole. 09/26/23   Shlomo Wilbert SAUNDERS, MD  atorvastatin  (LIPITOR) 40 MG tablet Take 1 tablet (40 mg total) by mouth at bedtime. 12/15/23     AURYXIA  1 GM 210 MG(Fe) tablet Take 630 mg by mouth  3 (three) times daily with meals. 07/05/21   [provider]  B Complex-C-Folic Acid  (DIALYVITE 800) 0.8 MG TABS Take 1 tablet by mouth daily.    [provider]  calcitRIOL  (ROCALTROL ) 0.25 MCG capsule Take 0.25 mcg by mouth daily.    [provider]  carvedilol  (COREG ) 25 MG tablet Take 1 tablet (25 mg total) by mouth 2 (two) times daily. 11/14/23   Marlee Bernardino NOVAK, MD  Continuous Glucose Sensor (DEXCOM G7 SENSOR) MISC Apply sensor to skin every 10 days 05/19/23     gentamicin  cream (GARAMYCIN ) 0.1 % Apply 1 Application topically daily as needed (catheter dressing change).    [provider]  hydrALAZINE  (APRESOLINE ) 50 MG tablet Take 1.5 tablets (75 mg total) by mouth 3 (three) times daily. 01/03/24 04/02/24  Shlomo Wilbert SAUNDERS, MD  HYDROcodone -acetaminophen  (NORCO/VICODIN) 5-325 MG tablet Take one tablet by mouth every 6 (six) hours as needed for up to 3 days. Max daily amounts 4 tablet 01/29/24     lactulose  (CHRONULAC ) 10 GM/15ML solution Take 60 mLs (40 g total) by mouth every 1 hours for max of 4 doses. If  no relief call RN/MD. 09/08/23     lactulose  (CHRONULAC ) 10 GM/15ML solution Take 30 ml by mouth 3 (three) times daily. 02/05/24   Dennise Hoes, MD  liothyronine  (CYTOMEL ) 5 MCG tablet Take 2 tablets (10 mcg total) by mouth every morning 30 minutes before food, drink or supplements. 09/25/23     METAMUCIL FIBER PO Take 2 Scoops by mouth daily.    [provider]  methocarbamol  (ROBAXIN ) 500 MG tablet Take 1 tablet (500 mg total) by mouth 3 (three) times daily for 4 days 01/29/24     METHYLENE BLUE PO Take 1 tablet by mouth daily.    [provider]  ondansetron  (ZOFRAN ) 4 MG tablet Take 1 tablet (4 mg total) by mouth every 8 (eight) hours as needed for up to 4 days 01/29/24     OVER THE COUNTER MEDICATION Take 1 tablet by mouth daily. Bergavin Lopid    [provider]  OVER THE COUNTER MEDICATION Take 3,000 mg by mouth daily. Omegavail 3000  mg    [provider]  OVER THE COUNTER MEDICATION Take 1 tablet by mouth daily. Homocysteine    [provider]  OVER THE COUNTER MEDICATION Take 10,000 Units by mouth daily. D-evail 10,000 units    [provider]  OVER THE COUNTER MEDICATION Take 1 tablet by mouth daily. Natto    [provider]  OVER THE COUNTER MEDICATION Take 1 tablet by mouth daily. Iberogast    [provider]  polyethylene glycol powder (GLYCOLAX /MIRALAX ) 17  GM/SCOOP powder Take 17 g by mouth daily.    [provider]  potassium chloride  (KLOR-CON  M) 10 MEQ tablet Take 3 tablets (30 mEq total) by mouth daily. 09/14/23   Pokhrel, Laxman, MD  rifampin  (RIFADIN ) 300 MG capsule Take 2 capsule by mouth once a day 10/25/23     spironolactone  (ALDACTONE ) 25 MG tablet Take 1 tablet (25 mg total) by mouth daily. 07/31/23     Tenapanor HCl, CKD, (XPHOZAH) 30 MG TABS Take 30 mg by mouth daily.    [provider]  tirzepatide  (MOUNJARO ) 5 MG/0.5ML Pen Inject 5 mg into the skin once a week. 10/05/23     topiramate  (TOPAMAX ) 25 MG tablet Take 1 tablet (25 mg total) by mouth daily. 01/11/24     traZODone  (DESYREL ) 50 MG tablet Take 4 tablets (200 mg total) by mouth at bedtime. 08/28/23       Social History   Socioeconomic History   Marital status: Married    Spouse name: Not on file   Number of children: 2   Years of education: Not on file   Highest education level: Not on file  Occupational History   Occupation: real Psychologist, occupational  Tobacco Use   Smoking status: Former    Current packs/day: 0.00    Types: Cigarettes    Start date: 05/17/2003    Quit date: 05/16/2009    Years since quitting: 14.7   Smokeless tobacco: Never   Tobacco comments:    smoked 1 pack per week   Vaping Use   Vaping status: Never Used  Substance and Sexual Activity   Alcohol use: Not Currently    Alcohol/week: 2.0 standard drinks of alcohol    Types: 2 Cans of beer per week    Comment:  occasionally   Drug use: No   Sexual activity: Yes  Other Topics Concern   Not on file  Social History Narrative   Not on file   Social Drivers of Health   Financial Resource Strain: Patient Declined (01/18/2024)   Received [facility]   Overall Financial Resource Strain (CARDIA)    How hard is it for you to pay for the very basics like food, housing, medical care, and heating?: Patient declined  Food Insecurity: Patient Declined (01/18/2024)   Received [facility]   Hunger Vital Sign    Within the past 12 months, you worried that your food would run out before you got the money to buy more.: Patient declined    Within the past 12 months, the food you bought just didn't last and you didn't have money to get more.: Patient declined  Transportation Needs: Patient Declined (01/18/2024)   Received from [facility] - Transportation    In the past 12 months, has lack of transportation kept you from medical appointments or from getting medications?: Patient declined    In the past 12 months, has lack of transportation kept you from meetings, work, or from getting things needed for daily living?: Patient declined  Physical Activity: Sufficiently Active (03/11/2022)   Exercise Vital Sign    Days of Exercise per Week: 3 days    Minutes of Exercise per Session: 60 min  Stress: No Stress Concern Present (03/11/2022)   [employer] [facility] [employer] - Occupational Stress Questionnaire    Feeling of Stress : Not at all  Social Connections: Unknown (09/27/2021)   Received from [employer]   Social Network    Social Network: Not  on file  Intimate Partner Violence: Not At Risk (09/09/2023)   Humiliation, Afraid, Rape, and Kick questionnaire    Fear of Current or Ex-Partner: No    Emotionally Abused: No    Physically Abused: No    Sexually Abused: No     Family History  Problem Relation Age of Onset   Diabetes Mother    Heart failure Mother    Hypertension  Mother    Heart disease Father    Hypertension Brother    Hypertension Brother    Colon cancer Neg Hx    Esophageal cancer Neg Hx    Rectal cancer Neg Hx    Stomach cancer Neg Hx     ROS: as above  Physical Examination  Vitals:   02/08/24 1039  BP: (!) 173/79  Pulse: 71  Resp: 16  SpO2: 93%     Awake alert and oriented Strong thrill left upper arm  CBC    Component Value Date/Time   WBC 8.0 09/14/2023 0701   RBC 2.95 (L) 09/14/2023 0701   HGB 8.9 (L) 09/14/2023 0701   HGB 9.5 (L) 10/26/2022 1432   HCT 26.7 (L) 09/14/2023 0701   HCT 30.0 (L) 10/26/2022 1432   PLT 209 09/14/2023 0701   PLT 132 (L) 10/26/2022 1432   MCV 90.5 09/14/2023 0701   MCV 89 10/26/2022 1432   MCH 30.2 09/14/2023 0701   MCHC 33.3 09/14/2023 0701   RDW 16.2 (H) 09/14/2023 0701   RDW 16.2 (H) 10/26/2022 1432   LYMPHSABS 1.1 09/13/2023 0617   MONOABS 0.8 09/13/2023 0617   EOSABS 0.1 09/13/2023 0617   BASOSABS 0.1 09/13/2023 0617    BMET    Component Value Date/Time   NA 131 (L) 09/14/2023 0701   NA 140 10/26/2022 1432   K 2.9 (L) 09/14/2023 0701   CL 93 (L) 09/14/2023 0701   CO2 24 09/14/2023 0701   GLUCOSE 110 (H) 09/14/2023 0701   BUN 30 (H) 09/14/2023 0701   BUN 74 (H) 10/26/2022 1432   CREATININE 9.06 (H) 09/14/2023 0701   CALCIUM  8.1 (L) 09/14/2023 0701   CALCIUM  8.1 (L) 11/12/2008 1600   GFRNONAA 6 (L) 09/14/2023 0701   GFRAA 3 (L) 01/24/2020 1303    COAGS: Lab Results  Component Value Date   INR 1.16 11/03/2017   INR 1.26 11/02/2017   INR 1.07 09/14/2009     Non-Invasive Vascular Imaging:   No new studies, previous dialysis duplex reviewed  ASSESSMENT/PLAN: This is a 53 y.o. male with failed peritoneal dialysis catheter now here for hemodialysis catheter placement.  We discussed risk benefits alternatives he demonstrates good understanding and agrees to proceed today.  He is planning to return to peritoneal dialysis catheter if this is unsuccessful he will need  basilic vein transposition and at least 1 month of rest prior to use.  This was discussed with the patient he demonstrates good understanding.  Rayah Fines C. Sheree, MD [facility] Office: [phone] Pager: [phone]

## 2024-02-09 DIAGNOSIS — N186 End stage renal disease: Secondary | ICD-10-CM | POA: Diagnosis not present

## 2024-02-09 DIAGNOSIS — Z992 Dependence on renal dialysis: Secondary | ICD-10-CM | POA: Diagnosis not present

## 2024-02-09 DIAGNOSIS — Z5181 Encounter for therapeutic drug level monitoring: Secondary | ICD-10-CM | POA: Diagnosis not present

## 2024-02-09 DIAGNOSIS — D631 Anemia in chronic kidney disease: Secondary | ICD-10-CM | POA: Diagnosis not present

## 2024-02-09 DIAGNOSIS — N2581 Secondary hyperparathyroidism of renal origin: Secondary | ICD-10-CM | POA: Diagnosis not present

## 2024-02-12 DIAGNOSIS — Z992 Dependence on renal dialysis: Secondary | ICD-10-CM | POA: Diagnosis not present

## 2024-02-12 DIAGNOSIS — Z5181 Encounter for therapeutic drug level monitoring: Secondary | ICD-10-CM | POA: Diagnosis not present

## 2024-02-12 DIAGNOSIS — N2581 Secondary hyperparathyroidism of renal origin: Secondary | ICD-10-CM | POA: Diagnosis not present

## 2024-02-12 DIAGNOSIS — D631 Anemia in chronic kidney disease: Secondary | ICD-10-CM | POA: Diagnosis not present

## 2024-02-12 DIAGNOSIS — N186 End stage renal disease: Secondary | ICD-10-CM | POA: Diagnosis not present

## 2024-02-13 DIAGNOSIS — E1122 Type 2 diabetes mellitus with diabetic chronic kidney disease: Secondary | ICD-10-CM | POA: Diagnosis not present

## 2024-02-13 DIAGNOSIS — Z992 Dependence on renal dialysis: Secondary | ICD-10-CM | POA: Diagnosis not present

## 2024-02-13 DIAGNOSIS — N2581 Secondary hyperparathyroidism of renal origin: Secondary | ICD-10-CM | POA: Diagnosis not present

## 2024-02-13 DIAGNOSIS — D631 Anemia in chronic kidney disease: Secondary | ICD-10-CM | POA: Diagnosis not present

## 2024-02-13 DIAGNOSIS — Z5181 Encounter for therapeutic drug level monitoring: Secondary | ICD-10-CM | POA: Diagnosis not present

## 2024-02-13 DIAGNOSIS — N186 End stage renal disease: Secondary | ICD-10-CM | POA: Diagnosis not present

## 2024-02-14 DIAGNOSIS — Z4932 Encounter for adequacy testing for peritoneal dialysis: Secondary | ICD-10-CM | POA: Diagnosis not present

## 2024-02-14 DIAGNOSIS — R888 Abnormal findings in other body fluids and substances: Secondary | ICD-10-CM | POA: Diagnosis not present

## 2024-02-14 DIAGNOSIS — N186 End stage renal disease: Secondary | ICD-10-CM | POA: Diagnosis not present

## 2024-02-14 DIAGNOSIS — Z992 Dependence on renal dialysis: Secondary | ICD-10-CM | POA: Diagnosis not present

## 2024-02-14 DIAGNOSIS — D631 Anemia in chronic kidney disease: Secondary | ICD-10-CM | POA: Diagnosis not present

## 2024-02-14 DIAGNOSIS — N2581 Secondary hyperparathyroidism of renal origin: Secondary | ICD-10-CM | POA: Diagnosis not present

## 2024-02-15 DIAGNOSIS — Z992 Dependence on renal dialysis: Secondary | ICD-10-CM | POA: Diagnosis not present

## 2024-02-15 DIAGNOSIS — Z4932 Encounter for adequacy testing for peritoneal dialysis: Secondary | ICD-10-CM | POA: Diagnosis not present

## 2024-02-15 DIAGNOSIS — N186 End stage renal disease: Secondary | ICD-10-CM | POA: Diagnosis not present

## 2024-02-15 DIAGNOSIS — I5032 Chronic diastolic (congestive) heart failure: Secondary | ICD-10-CM | POA: Diagnosis not present

## 2024-02-15 DIAGNOSIS — N185 Chronic kidney disease, stage 5: Secondary | ICD-10-CM | POA: Diagnosis not present

## 2024-02-15 DIAGNOSIS — N2581 Secondary hyperparathyroidism of renal origin: Secondary | ICD-10-CM | POA: Diagnosis not present

## 2024-02-15 DIAGNOSIS — D631 Anemia in chronic kidney disease: Secondary | ICD-10-CM | POA: Diagnosis not present

## 2024-02-15 DIAGNOSIS — N189 Chronic kidney disease, unspecified: Secondary | ICD-10-CM | POA: Diagnosis not present

## 2024-02-15 DIAGNOSIS — E1122 Type 2 diabetes mellitus with diabetic chronic kidney disease: Secondary | ICD-10-CM | POA: Diagnosis not present

## 2024-02-15 DIAGNOSIS — I120 Hypertensive chronic kidney disease with stage 5 chronic kidney disease or end stage renal disease: Secondary | ICD-10-CM | POA: Diagnosis not present

## 2024-02-15 DIAGNOSIS — R888 Abnormal findings in other body fluids and substances: Secondary | ICD-10-CM | POA: Diagnosis not present

## 2024-02-16 DIAGNOSIS — N186 End stage renal disease: Secondary | ICD-10-CM | POA: Diagnosis not present

## 2024-02-16 DIAGNOSIS — N2581 Secondary hyperparathyroidism of renal origin: Secondary | ICD-10-CM | POA: Diagnosis not present

## 2024-02-16 DIAGNOSIS — R888 Abnormal findings in other body fluids and substances: Secondary | ICD-10-CM | POA: Diagnosis not present

## 2024-02-16 DIAGNOSIS — Z4932 Encounter for adequacy testing for peritoneal dialysis: Secondary | ICD-10-CM | POA: Diagnosis not present

## 2024-02-16 DIAGNOSIS — Z992 Dependence on renal dialysis: Secondary | ICD-10-CM | POA: Diagnosis not present

## 2024-02-16 DIAGNOSIS — D631 Anemia in chronic kidney disease: Secondary | ICD-10-CM | POA: Diagnosis not present

## 2024-02-17 DIAGNOSIS — Z992 Dependence on renal dialysis: Secondary | ICD-10-CM | POA: Diagnosis not present

## 2024-02-17 DIAGNOSIS — Z4932 Encounter for adequacy testing for peritoneal dialysis: Secondary | ICD-10-CM | POA: Diagnosis not present

## 2024-02-17 DIAGNOSIS — D631 Anemia in chronic kidney disease: Secondary | ICD-10-CM | POA: Diagnosis not present

## 2024-02-17 DIAGNOSIS — N2581 Secondary hyperparathyroidism of renal origin: Secondary | ICD-10-CM | POA: Diagnosis not present

## 2024-02-17 DIAGNOSIS — N186 End stage renal disease: Secondary | ICD-10-CM | POA: Diagnosis not present

## 2024-02-17 DIAGNOSIS — R888 Abnormal findings in other body fluids and substances: Secondary | ICD-10-CM | POA: Diagnosis not present

## 2024-02-18 DIAGNOSIS — Z4932 Encounter for adequacy testing for peritoneal dialysis: Secondary | ICD-10-CM | POA: Diagnosis not present

## 2024-02-18 DIAGNOSIS — Z992 Dependence on renal dialysis: Secondary | ICD-10-CM | POA: Diagnosis not present

## 2024-02-18 DIAGNOSIS — N2581 Secondary hyperparathyroidism of renal origin: Secondary | ICD-10-CM | POA: Diagnosis not present

## 2024-02-18 DIAGNOSIS — N186 End stage renal disease: Secondary | ICD-10-CM | POA: Diagnosis not present

## 2024-02-18 DIAGNOSIS — R888 Abnormal findings in other body fluids and substances: Secondary | ICD-10-CM | POA: Diagnosis not present

## 2024-02-18 DIAGNOSIS — D631 Anemia in chronic kidney disease: Secondary | ICD-10-CM | POA: Diagnosis not present

## 2024-02-19 DIAGNOSIS — Z992 Dependence on renal dialysis: Secondary | ICD-10-CM | POA: Diagnosis not present

## 2024-02-19 DIAGNOSIS — D631 Anemia in chronic kidney disease: Secondary | ICD-10-CM | POA: Diagnosis not present

## 2024-02-19 DIAGNOSIS — N2581 Secondary hyperparathyroidism of renal origin: Secondary | ICD-10-CM | POA: Diagnosis not present

## 2024-02-19 DIAGNOSIS — Z4932 Encounter for adequacy testing for peritoneal dialysis: Secondary | ICD-10-CM | POA: Diagnosis not present

## 2024-02-19 DIAGNOSIS — N186 End stage renal disease: Secondary | ICD-10-CM | POA: Diagnosis not present

## 2024-02-19 DIAGNOSIS — R888 Abnormal findings in other body fluids and substances: Secondary | ICD-10-CM | POA: Diagnosis not present

## 2024-02-20 DIAGNOSIS — R888 Abnormal findings in other body fluids and substances: Secondary | ICD-10-CM | POA: Diagnosis not present

## 2024-02-20 DIAGNOSIS — D631 Anemia in chronic kidney disease: Secondary | ICD-10-CM | POA: Diagnosis not present

## 2024-02-20 DIAGNOSIS — Z992 Dependence on renal dialysis: Secondary | ICD-10-CM | POA: Diagnosis not present

## 2024-02-20 DIAGNOSIS — Z4932 Encounter for adequacy testing for peritoneal dialysis: Secondary | ICD-10-CM | POA: Diagnosis not present

## 2024-02-20 DIAGNOSIS — N186 End stage renal disease: Secondary | ICD-10-CM | POA: Diagnosis not present

## 2024-02-20 DIAGNOSIS — N2581 Secondary hyperparathyroidism of renal origin: Secondary | ICD-10-CM | POA: Diagnosis not present

## 2024-02-21 DIAGNOSIS — E1122 Type 2 diabetes mellitus with diabetic chronic kidney disease: Secondary | ICD-10-CM | POA: Diagnosis not present

## 2024-02-21 DIAGNOSIS — R888 Abnormal findings in other body fluids and substances: Secondary | ICD-10-CM | POA: Diagnosis not present

## 2024-02-21 DIAGNOSIS — K760 Fatty (change of) liver, not elsewhere classified: Secondary | ICD-10-CM | POA: Diagnosis not present

## 2024-02-21 DIAGNOSIS — Z992 Dependence on renal dialysis: Secondary | ICD-10-CM | POA: Diagnosis not present

## 2024-02-21 DIAGNOSIS — I10 Essential (primary) hypertension: Secondary | ICD-10-CM | POA: Diagnosis not present

## 2024-02-21 DIAGNOSIS — N186 End stage renal disease: Secondary | ICD-10-CM | POA: Diagnosis not present

## 2024-02-21 DIAGNOSIS — N2581 Secondary hyperparathyroidism of renal origin: Secondary | ICD-10-CM | POA: Diagnosis not present

## 2024-02-21 DIAGNOSIS — Z6841 Body Mass Index (BMI) 40.0 and over, adult: Secondary | ICD-10-CM | POA: Diagnosis not present

## 2024-02-21 DIAGNOSIS — Z4932 Encounter for adequacy testing for peritoneal dialysis: Secondary | ICD-10-CM | POA: Diagnosis not present

## 2024-02-21 DIAGNOSIS — E7800 Pure hypercholesterolemia, unspecified: Secondary | ICD-10-CM | POA: Diagnosis not present

## 2024-02-21 DIAGNOSIS — D631 Anemia in chronic kidney disease: Secondary | ICD-10-CM | POA: Diagnosis not present

## 2024-02-22 ENCOUNTER — Other Ambulatory Visit (HOSPITAL_BASED_OUTPATIENT_CLINIC_OR_DEPARTMENT_OTHER): Payer: Self-pay

## 2024-02-22 ENCOUNTER — Other Ambulatory Visit: Payer: Self-pay

## 2024-02-22 DIAGNOSIS — Z4932 Encounter for adequacy testing for peritoneal dialysis: Secondary | ICD-10-CM | POA: Diagnosis not present

## 2024-02-22 DIAGNOSIS — N186 End stage renal disease: Secondary | ICD-10-CM | POA: Diagnosis not present

## 2024-02-22 DIAGNOSIS — Z992 Dependence on renal dialysis: Secondary | ICD-10-CM | POA: Diagnosis not present

## 2024-02-22 DIAGNOSIS — D631 Anemia in chronic kidney disease: Secondary | ICD-10-CM | POA: Diagnosis not present

## 2024-02-22 DIAGNOSIS — N2581 Secondary hyperparathyroidism of renal origin: Secondary | ICD-10-CM | POA: Diagnosis not present

## 2024-02-22 DIAGNOSIS — R888 Abnormal findings in other body fluids and substances: Secondary | ICD-10-CM | POA: Diagnosis not present

## 2024-02-22 MED ORDER — MOUNJARO 5 MG/0.5ML ~~LOC~~ SOAJ
5.0000 mg | SUBCUTANEOUS | 0 refills | Status: AC
  Filled 2024-02-22 (×2): qty 2, 28d supply, fill #0

## 2024-02-23 DIAGNOSIS — R888 Abnormal findings in other body fluids and substances: Secondary | ICD-10-CM | POA: Diagnosis not present

## 2024-02-23 DIAGNOSIS — Z4932 Encounter for adequacy testing for peritoneal dialysis: Secondary | ICD-10-CM | POA: Diagnosis not present

## 2024-02-23 DIAGNOSIS — N186 End stage renal disease: Secondary | ICD-10-CM | POA: Diagnosis not present

## 2024-02-23 DIAGNOSIS — N2581 Secondary hyperparathyroidism of renal origin: Secondary | ICD-10-CM | POA: Diagnosis not present

## 2024-02-23 DIAGNOSIS — D631 Anemia in chronic kidney disease: Secondary | ICD-10-CM | POA: Diagnosis not present

## 2024-02-23 DIAGNOSIS — Z992 Dependence on renal dialysis: Secondary | ICD-10-CM | POA: Diagnosis not present

## 2024-02-24 DIAGNOSIS — D631 Anemia in chronic kidney disease: Secondary | ICD-10-CM | POA: Diagnosis not present

## 2024-02-24 DIAGNOSIS — N2581 Secondary hyperparathyroidism of renal origin: Secondary | ICD-10-CM | POA: Diagnosis not present

## 2024-02-24 DIAGNOSIS — N186 End stage renal disease: Secondary | ICD-10-CM | POA: Diagnosis not present

## 2024-02-24 DIAGNOSIS — Z992 Dependence on renal dialysis: Secondary | ICD-10-CM | POA: Diagnosis not present

## 2024-02-24 DIAGNOSIS — Z4932 Encounter for adequacy testing for peritoneal dialysis: Secondary | ICD-10-CM | POA: Diagnosis not present

## 2024-02-24 DIAGNOSIS — R888 Abnormal findings in other body fluids and substances: Secondary | ICD-10-CM | POA: Diagnosis not present

## 2024-02-25 DIAGNOSIS — Z4932 Encounter for adequacy testing for peritoneal dialysis: Secondary | ICD-10-CM | POA: Diagnosis not present

## 2024-02-25 DIAGNOSIS — N2581 Secondary hyperparathyroidism of renal origin: Secondary | ICD-10-CM | POA: Diagnosis not present

## 2024-02-25 DIAGNOSIS — R888 Abnormal findings in other body fluids and substances: Secondary | ICD-10-CM | POA: Diagnosis not present

## 2024-02-25 DIAGNOSIS — N186 End stage renal disease: Secondary | ICD-10-CM | POA: Diagnosis not present

## 2024-02-25 DIAGNOSIS — Z992 Dependence on renal dialysis: Secondary | ICD-10-CM | POA: Diagnosis not present

## 2024-02-25 DIAGNOSIS — D631 Anemia in chronic kidney disease: Secondary | ICD-10-CM | POA: Diagnosis not present

## 2024-02-26 DIAGNOSIS — N186 End stage renal disease: Secondary | ICD-10-CM | POA: Diagnosis not present

## 2024-02-26 DIAGNOSIS — N2581 Secondary hyperparathyroidism of renal origin: Secondary | ICD-10-CM | POA: Diagnosis not present

## 2024-02-26 DIAGNOSIS — D631 Anemia in chronic kidney disease: Secondary | ICD-10-CM | POA: Diagnosis not present

## 2024-02-26 DIAGNOSIS — Z992 Dependence on renal dialysis: Secondary | ICD-10-CM | POA: Diagnosis not present

## 2024-02-26 DIAGNOSIS — R888 Abnormal findings in other body fluids and substances: Secondary | ICD-10-CM | POA: Diagnosis not present

## 2024-02-26 DIAGNOSIS — Z4932 Encounter for adequacy testing for peritoneal dialysis: Secondary | ICD-10-CM | POA: Diagnosis not present

## 2024-02-27 DIAGNOSIS — N186 End stage renal disease: Secondary | ICD-10-CM | POA: Diagnosis not present

## 2024-02-27 DIAGNOSIS — R888 Abnormal findings in other body fluids and substances: Secondary | ICD-10-CM | POA: Diagnosis not present

## 2024-02-27 DIAGNOSIS — Z992 Dependence on renal dialysis: Secondary | ICD-10-CM | POA: Diagnosis not present

## 2024-02-27 DIAGNOSIS — N2581 Secondary hyperparathyroidism of renal origin: Secondary | ICD-10-CM | POA: Diagnosis not present

## 2024-02-27 DIAGNOSIS — D631 Anemia in chronic kidney disease: Secondary | ICD-10-CM | POA: Diagnosis not present

## 2024-02-27 DIAGNOSIS — Z4932 Encounter for adequacy testing for peritoneal dialysis: Secondary | ICD-10-CM | POA: Diagnosis not present

## 2024-02-28 DIAGNOSIS — Z992 Dependence on renal dialysis: Secondary | ICD-10-CM | POA: Diagnosis not present

## 2024-02-28 DIAGNOSIS — R888 Abnormal findings in other body fluids and substances: Secondary | ICD-10-CM | POA: Diagnosis not present

## 2024-02-28 DIAGNOSIS — Z4932 Encounter for adequacy testing for peritoneal dialysis: Secondary | ICD-10-CM | POA: Diagnosis not present

## 2024-02-28 DIAGNOSIS — D631 Anemia in chronic kidney disease: Secondary | ICD-10-CM | POA: Diagnosis not present

## 2024-02-28 DIAGNOSIS — N186 End stage renal disease: Secondary | ICD-10-CM | POA: Diagnosis not present

## 2024-02-28 DIAGNOSIS — N2581 Secondary hyperparathyroidism of renal origin: Secondary | ICD-10-CM | POA: Diagnosis not present

## 2024-02-29 DIAGNOSIS — N2581 Secondary hyperparathyroidism of renal origin: Secondary | ICD-10-CM | POA: Diagnosis not present

## 2024-02-29 DIAGNOSIS — Z4932 Encounter for adequacy testing for peritoneal dialysis: Secondary | ICD-10-CM | POA: Diagnosis not present

## 2024-02-29 DIAGNOSIS — Z992 Dependence on renal dialysis: Secondary | ICD-10-CM | POA: Diagnosis not present

## 2024-02-29 DIAGNOSIS — N186 End stage renal disease: Secondary | ICD-10-CM | POA: Diagnosis not present

## 2024-02-29 DIAGNOSIS — R888 Abnormal findings in other body fluids and substances: Secondary | ICD-10-CM | POA: Diagnosis not present

## 2024-02-29 DIAGNOSIS — D631 Anemia in chronic kidney disease: Secondary | ICD-10-CM | POA: Diagnosis not present

## 2024-03-01 DIAGNOSIS — D631 Anemia in chronic kidney disease: Secondary | ICD-10-CM | POA: Diagnosis not present

## 2024-03-01 DIAGNOSIS — N186 End stage renal disease: Secondary | ICD-10-CM | POA: Diagnosis not present

## 2024-03-01 DIAGNOSIS — Z4932 Encounter for adequacy testing for peritoneal dialysis: Secondary | ICD-10-CM | POA: Diagnosis not present

## 2024-03-01 DIAGNOSIS — N2581 Secondary hyperparathyroidism of renal origin: Secondary | ICD-10-CM | POA: Diagnosis not present

## 2024-03-01 DIAGNOSIS — Z992 Dependence on renal dialysis: Secondary | ICD-10-CM | POA: Diagnosis not present

## 2024-03-01 DIAGNOSIS — R888 Abnormal findings in other body fluids and substances: Secondary | ICD-10-CM | POA: Diagnosis not present

## 2024-03-02 ENCOUNTER — Other Ambulatory Visit (HOSPITAL_BASED_OUTPATIENT_CLINIC_OR_DEPARTMENT_OTHER): Payer: Self-pay

## 2024-03-02 DIAGNOSIS — N186 End stage renal disease: Secondary | ICD-10-CM | POA: Diagnosis not present

## 2024-03-02 DIAGNOSIS — Z992 Dependence on renal dialysis: Secondary | ICD-10-CM | POA: Diagnosis not present

## 2024-03-02 DIAGNOSIS — R888 Abnormal findings in other body fluids and substances: Secondary | ICD-10-CM | POA: Diagnosis not present

## 2024-03-02 DIAGNOSIS — D631 Anemia in chronic kidney disease: Secondary | ICD-10-CM | POA: Diagnosis not present

## 2024-03-02 DIAGNOSIS — N2581 Secondary hyperparathyroidism of renal origin: Secondary | ICD-10-CM | POA: Diagnosis not present

## 2024-03-02 DIAGNOSIS — Z4932 Encounter for adequacy testing for peritoneal dialysis: Secondary | ICD-10-CM | POA: Diagnosis not present

## 2024-03-03 DIAGNOSIS — R888 Abnormal findings in other body fluids and substances: Secondary | ICD-10-CM | POA: Diagnosis not present

## 2024-03-03 DIAGNOSIS — N186 End stage renal disease: Secondary | ICD-10-CM | POA: Diagnosis not present

## 2024-03-03 DIAGNOSIS — N2581 Secondary hyperparathyroidism of renal origin: Secondary | ICD-10-CM | POA: Diagnosis not present

## 2024-03-03 DIAGNOSIS — Z4932 Encounter for adequacy testing for peritoneal dialysis: Secondary | ICD-10-CM | POA: Diagnosis not present

## 2024-03-03 DIAGNOSIS — Z992 Dependence on renal dialysis: Secondary | ICD-10-CM | POA: Diagnosis not present

## 2024-03-03 DIAGNOSIS — D631 Anemia in chronic kidney disease: Secondary | ICD-10-CM | POA: Diagnosis not present

## 2024-03-04 ENCOUNTER — Other Ambulatory Visit (HOSPITAL_BASED_OUTPATIENT_CLINIC_OR_DEPARTMENT_OTHER): Payer: Self-pay

## 2024-03-04 DIAGNOSIS — Z992 Dependence on renal dialysis: Secondary | ICD-10-CM | POA: Diagnosis not present

## 2024-03-04 DIAGNOSIS — N186 End stage renal disease: Secondary | ICD-10-CM | POA: Diagnosis not present

## 2024-03-04 DIAGNOSIS — Z4932 Encounter for adequacy testing for peritoneal dialysis: Secondary | ICD-10-CM | POA: Diagnosis not present

## 2024-03-04 DIAGNOSIS — R888 Abnormal findings in other body fluids and substances: Secondary | ICD-10-CM | POA: Diagnosis not present

## 2024-03-04 DIAGNOSIS — N2581 Secondary hyperparathyroidism of renal origin: Secondary | ICD-10-CM | POA: Diagnosis not present

## 2024-03-04 DIAGNOSIS — D631 Anemia in chronic kidney disease: Secondary | ICD-10-CM | POA: Diagnosis not present

## 2024-03-05 DIAGNOSIS — Z4932 Encounter for adequacy testing for peritoneal dialysis: Secondary | ICD-10-CM | POA: Diagnosis not present

## 2024-03-05 DIAGNOSIS — N2581 Secondary hyperparathyroidism of renal origin: Secondary | ICD-10-CM | POA: Diagnosis not present

## 2024-03-05 DIAGNOSIS — Z992 Dependence on renal dialysis: Secondary | ICD-10-CM | POA: Diagnosis not present

## 2024-03-05 DIAGNOSIS — F4312 Post-traumatic stress disorder, chronic: Secondary | ICD-10-CM | POA: Diagnosis not present

## 2024-03-05 DIAGNOSIS — D631 Anemia in chronic kidney disease: Secondary | ICD-10-CM | POA: Diagnosis not present

## 2024-03-05 DIAGNOSIS — N186 End stage renal disease: Secondary | ICD-10-CM | POA: Diagnosis not present

## 2024-03-05 DIAGNOSIS — R888 Abnormal findings in other body fluids and substances: Secondary | ICD-10-CM | POA: Diagnosis not present

## 2024-03-06 DIAGNOSIS — Z4932 Encounter for adequacy testing for peritoneal dialysis: Secondary | ICD-10-CM | POA: Diagnosis not present

## 2024-03-06 DIAGNOSIS — R888 Abnormal findings in other body fluids and substances: Secondary | ICD-10-CM | POA: Diagnosis not present

## 2024-03-06 DIAGNOSIS — N186 End stage renal disease: Secondary | ICD-10-CM | POA: Diagnosis not present

## 2024-03-06 DIAGNOSIS — D631 Anemia in chronic kidney disease: Secondary | ICD-10-CM | POA: Diagnosis not present

## 2024-03-06 DIAGNOSIS — N2581 Secondary hyperparathyroidism of renal origin: Secondary | ICD-10-CM | POA: Diagnosis not present

## 2024-03-06 DIAGNOSIS — Z992 Dependence on renal dialysis: Secondary | ICD-10-CM | POA: Diagnosis not present

## 2024-03-07 DIAGNOSIS — D631 Anemia in chronic kidney disease: Secondary | ICD-10-CM | POA: Diagnosis not present

## 2024-03-07 DIAGNOSIS — N2581 Secondary hyperparathyroidism of renal origin: Secondary | ICD-10-CM | POA: Diagnosis not present

## 2024-03-07 DIAGNOSIS — R888 Abnormal findings in other body fluids and substances: Secondary | ICD-10-CM | POA: Diagnosis not present

## 2024-03-07 DIAGNOSIS — Z4932 Encounter for adequacy testing for peritoneal dialysis: Secondary | ICD-10-CM | POA: Diagnosis not present

## 2024-03-07 DIAGNOSIS — N186 End stage renal disease: Secondary | ICD-10-CM | POA: Diagnosis not present

## 2024-03-07 DIAGNOSIS — Z992 Dependence on renal dialysis: Secondary | ICD-10-CM | POA: Diagnosis not present

## 2024-03-08 DIAGNOSIS — Z4932 Encounter for adequacy testing for peritoneal dialysis: Secondary | ICD-10-CM | POA: Diagnosis not present

## 2024-03-08 DIAGNOSIS — R888 Abnormal findings in other body fluids and substances: Secondary | ICD-10-CM | POA: Diagnosis not present

## 2024-03-08 DIAGNOSIS — Z992 Dependence on renal dialysis: Secondary | ICD-10-CM | POA: Diagnosis not present

## 2024-03-08 DIAGNOSIS — D631 Anemia in chronic kidney disease: Secondary | ICD-10-CM | POA: Diagnosis not present

## 2024-03-08 DIAGNOSIS — N186 End stage renal disease: Secondary | ICD-10-CM | POA: Diagnosis not present

## 2024-03-08 DIAGNOSIS — N2581 Secondary hyperparathyroidism of renal origin: Secondary | ICD-10-CM | POA: Diagnosis not present

## 2024-03-09 DIAGNOSIS — R888 Abnormal findings in other body fluids and substances: Secondary | ICD-10-CM | POA: Diagnosis not present

## 2024-03-09 DIAGNOSIS — Z992 Dependence on renal dialysis: Secondary | ICD-10-CM | POA: Diagnosis not present

## 2024-03-09 DIAGNOSIS — N186 End stage renal disease: Secondary | ICD-10-CM | POA: Diagnosis not present

## 2024-03-09 DIAGNOSIS — D631 Anemia in chronic kidney disease: Secondary | ICD-10-CM | POA: Diagnosis not present

## 2024-03-09 DIAGNOSIS — N2581 Secondary hyperparathyroidism of renal origin: Secondary | ICD-10-CM | POA: Diagnosis not present

## 2024-03-09 DIAGNOSIS — Z4932 Encounter for adequacy testing for peritoneal dialysis: Secondary | ICD-10-CM | POA: Diagnosis not present

## 2024-03-10 DIAGNOSIS — D631 Anemia in chronic kidney disease: Secondary | ICD-10-CM | POA: Diagnosis not present

## 2024-03-10 DIAGNOSIS — Z992 Dependence on renal dialysis: Secondary | ICD-10-CM | POA: Diagnosis not present

## 2024-03-10 DIAGNOSIS — N186 End stage renal disease: Secondary | ICD-10-CM | POA: Diagnosis not present

## 2024-03-10 DIAGNOSIS — Z4932 Encounter for adequacy testing for peritoneal dialysis: Secondary | ICD-10-CM | POA: Diagnosis not present

## 2024-03-10 DIAGNOSIS — R888 Abnormal findings in other body fluids and substances: Secondary | ICD-10-CM | POA: Diagnosis not present

## 2024-03-10 DIAGNOSIS — N2581 Secondary hyperparathyroidism of renal origin: Secondary | ICD-10-CM | POA: Diagnosis not present

## 2024-03-11 DIAGNOSIS — Z992 Dependence on renal dialysis: Secondary | ICD-10-CM | POA: Diagnosis not present

## 2024-03-11 DIAGNOSIS — N2581 Secondary hyperparathyroidism of renal origin: Secondary | ICD-10-CM | POA: Diagnosis not present

## 2024-03-11 DIAGNOSIS — Z4932 Encounter for adequacy testing for peritoneal dialysis: Secondary | ICD-10-CM | POA: Diagnosis not present

## 2024-03-11 DIAGNOSIS — N186 End stage renal disease: Secondary | ICD-10-CM | POA: Diagnosis not present

## 2024-03-11 DIAGNOSIS — R888 Abnormal findings in other body fluids and substances: Secondary | ICD-10-CM | POA: Diagnosis not present

## 2024-03-11 DIAGNOSIS — D631 Anemia in chronic kidney disease: Secondary | ICD-10-CM | POA: Diagnosis not present

## 2024-03-12 DIAGNOSIS — N2581 Secondary hyperparathyroidism of renal origin: Secondary | ICD-10-CM | POA: Diagnosis not present

## 2024-03-12 DIAGNOSIS — N186 End stage renal disease: Secondary | ICD-10-CM | POA: Diagnosis not present

## 2024-03-12 DIAGNOSIS — D631 Anemia in chronic kidney disease: Secondary | ICD-10-CM | POA: Diagnosis not present

## 2024-03-12 DIAGNOSIS — Z992 Dependence on renal dialysis: Secondary | ICD-10-CM | POA: Diagnosis not present

## 2024-03-12 DIAGNOSIS — R888 Abnormal findings in other body fluids and substances: Secondary | ICD-10-CM | POA: Diagnosis not present

## 2024-03-12 DIAGNOSIS — Z4932 Encounter for adequacy testing for peritoneal dialysis: Secondary | ICD-10-CM | POA: Diagnosis not present

## 2024-03-13 ENCOUNTER — Other Ambulatory Visit (HOSPITAL_COMMUNITY): Payer: Self-pay | Admitting: Nephrology

## 2024-03-13 DIAGNOSIS — Z125 Encounter for screening for malignant neoplasm of prostate: Secondary | ICD-10-CM | POA: Diagnosis not present

## 2024-03-13 DIAGNOSIS — Z0000 Encounter for general adult medical examination without abnormal findings: Secondary | ICD-10-CM | POA: Diagnosis not present

## 2024-03-13 DIAGNOSIS — E1122 Type 2 diabetes mellitus with diabetic chronic kidney disease: Secondary | ICD-10-CM | POA: Diagnosis not present

## 2024-03-13 DIAGNOSIS — Z992 Dependence on renal dialysis: Secondary | ICD-10-CM | POA: Diagnosis not present

## 2024-03-13 DIAGNOSIS — N2581 Secondary hyperparathyroidism of renal origin: Secondary | ICD-10-CM | POA: Diagnosis not present

## 2024-03-13 DIAGNOSIS — N186 End stage renal disease: Secondary | ICD-10-CM

## 2024-03-13 DIAGNOSIS — I10 Essential (primary) hypertension: Secondary | ICD-10-CM | POA: Diagnosis not present

## 2024-03-13 DIAGNOSIS — D631 Anemia in chronic kidney disease: Secondary | ICD-10-CM | POA: Diagnosis not present

## 2024-03-13 DIAGNOSIS — Z4932 Encounter for adequacy testing for peritoneal dialysis: Secondary | ICD-10-CM | POA: Diagnosis not present

## 2024-03-13 DIAGNOSIS — R888 Abnormal findings in other body fluids and substances: Secondary | ICD-10-CM | POA: Diagnosis not present

## 2024-03-14 ENCOUNTER — Ambulatory Visit (HOSPITAL_COMMUNITY)
Admission: RE | Admit: 2024-03-14 | Discharge: 2024-03-14 | Disposition: A | Discharge status: Home / Self Care | Source: Ambulatory Visit | Attending: Nephrology | Admitting: Nephrology

## 2024-03-14 DIAGNOSIS — Z4901 Encounter for fitting and adjustment of extracorporeal dialysis catheter: Secondary | ICD-10-CM | POA: Insufficient documentation

## 2024-03-14 DIAGNOSIS — N186 End stage renal disease: Secondary | ICD-10-CM | POA: Diagnosis not present

## 2024-03-14 HISTORY — PX: IR REMOVAL TUN CV CATH W/O FL: IMG2289

## 2024-03-14 MED ORDER — LIDOCAINE-EPINEPHRINE 1 %-1:100000 IJ SOLN
INTRAMUSCULAR | Status: AC
  Filled 2024-03-14: qty 1

## 2024-03-14 MED ORDER — LIDOCAINE-EPINEPHRINE 1 %-1:100000 IJ SOLN
20.0000 mL | Freq: Once | INTRAMUSCULAR | Status: AC
  Administered 2024-03-14: 15 mL

## 2024-03-14 NOTE — Procedures (Signed)
 Successful removal of right IJ tunneled HD catheter.   After obtaining consent and performing a time-out, the right upper chest was prepped and draped in the normal sterile fashion. The heparin  was removed from both ports. 1% lidocaine  with epinephrine  was used for local anesthesia. Using gentle blunt dissection and moderate manual traction the cuff of the catheter was exposed and the catheter was removed in its entirety. Pressure was held until hemostasis was obtained. A sterile dressing was applied. The patient tolerated the procedure well with no immediate complications.   Shallyn Constancio, AGACNP-BC 03/14/2024, 4:05 PM

## 2024-03-15 DIAGNOSIS — Z992 Dependence on renal dialysis: Secondary | ICD-10-CM | POA: Diagnosis not present

## 2024-03-15 DIAGNOSIS — D631 Anemia in chronic kidney disease: Secondary | ICD-10-CM | POA: Diagnosis not present

## 2024-03-15 DIAGNOSIS — N186 End stage renal disease: Secondary | ICD-10-CM | POA: Diagnosis not present

## 2024-03-15 DIAGNOSIS — R888 Abnormal findings in other body fluids and substances: Secondary | ICD-10-CM | POA: Diagnosis not present

## 2024-03-15 DIAGNOSIS — N2581 Secondary hyperparathyroidism of renal origin: Secondary | ICD-10-CM | POA: Diagnosis not present

## 2024-03-15 DIAGNOSIS — Z4932 Encounter for adequacy testing for peritoneal dialysis: Secondary | ICD-10-CM | POA: Diagnosis not present

## 2024-03-15 DIAGNOSIS — E1122 Type 2 diabetes mellitus with diabetic chronic kidney disease: Secondary | ICD-10-CM | POA: Diagnosis not present

## 2024-03-16 DIAGNOSIS — N186 End stage renal disease: Secondary | ICD-10-CM | POA: Diagnosis not present

## 2024-03-16 DIAGNOSIS — N2581 Secondary hyperparathyroidism of renal origin: Secondary | ICD-10-CM | POA: Diagnosis not present

## 2024-03-16 DIAGNOSIS — Z5181 Encounter for therapeutic drug level monitoring: Secondary | ICD-10-CM | POA: Diagnosis not present

## 2024-03-16 DIAGNOSIS — Z992 Dependence on renal dialysis: Secondary | ICD-10-CM | POA: Diagnosis not present

## 2024-03-16 DIAGNOSIS — D631 Anemia in chronic kidney disease: Secondary | ICD-10-CM | POA: Diagnosis not present

## 2024-03-17 DIAGNOSIS — N2581 Secondary hyperparathyroidism of renal origin: Secondary | ICD-10-CM | POA: Diagnosis not present

## 2024-03-17 DIAGNOSIS — Z5181 Encounter for therapeutic drug level monitoring: Secondary | ICD-10-CM | POA: Diagnosis not present

## 2024-03-17 DIAGNOSIS — Z992 Dependence on renal dialysis: Secondary | ICD-10-CM | POA: Diagnosis not present

## 2024-03-17 DIAGNOSIS — D631 Anemia in chronic kidney disease: Secondary | ICD-10-CM | POA: Diagnosis not present

## 2024-03-17 DIAGNOSIS — N186 End stage renal disease: Secondary | ICD-10-CM | POA: Diagnosis not present

## 2024-03-18 DIAGNOSIS — Z5181 Encounter for therapeutic drug level monitoring: Secondary | ICD-10-CM | POA: Diagnosis not present

## 2024-03-18 DIAGNOSIS — N186 End stage renal disease: Secondary | ICD-10-CM | POA: Diagnosis not present

## 2024-03-18 DIAGNOSIS — D631 Anemia in chronic kidney disease: Secondary | ICD-10-CM | POA: Diagnosis not present

## 2024-03-18 DIAGNOSIS — Z992 Dependence on renal dialysis: Secondary | ICD-10-CM | POA: Diagnosis not present

## 2024-03-18 DIAGNOSIS — N2581 Secondary hyperparathyroidism of renal origin: Secondary | ICD-10-CM | POA: Diagnosis not present

## 2024-03-19 DIAGNOSIS — D631 Anemia in chronic kidney disease: Secondary | ICD-10-CM | POA: Diagnosis not present

## 2024-03-19 DIAGNOSIS — N2581 Secondary hyperparathyroidism of renal origin: Secondary | ICD-10-CM | POA: Diagnosis not present

## 2024-03-19 DIAGNOSIS — Z5181 Encounter for therapeutic drug level monitoring: Secondary | ICD-10-CM | POA: Diagnosis not present

## 2024-03-19 DIAGNOSIS — Z992 Dependence on renal dialysis: Secondary | ICD-10-CM | POA: Diagnosis not present

## 2024-03-19 DIAGNOSIS — N186 End stage renal disease: Secondary | ICD-10-CM | POA: Diagnosis not present

## 2024-03-19 DIAGNOSIS — F4312 Post-traumatic stress disorder, chronic: Secondary | ICD-10-CM | POA: Diagnosis not present

## 2024-03-20 DIAGNOSIS — N186 End stage renal disease: Secondary | ICD-10-CM | POA: Diagnosis not present

## 2024-03-20 DIAGNOSIS — Z992 Dependence on renal dialysis: Secondary | ICD-10-CM | POA: Diagnosis not present

## 2024-03-20 DIAGNOSIS — Z5181 Encounter for therapeutic drug level monitoring: Secondary | ICD-10-CM | POA: Diagnosis not present

## 2024-03-20 DIAGNOSIS — D631 Anemia in chronic kidney disease: Secondary | ICD-10-CM | POA: Diagnosis not present

## 2024-03-20 DIAGNOSIS — N2581 Secondary hyperparathyroidism of renal origin: Secondary | ICD-10-CM | POA: Diagnosis not present

## 2024-03-21 ENCOUNTER — Other Ambulatory Visit (HOSPITAL_BASED_OUTPATIENT_CLINIC_OR_DEPARTMENT_OTHER): Payer: Self-pay

## 2024-03-21 DIAGNOSIS — Z5181 Encounter for therapeutic drug level monitoring: Secondary | ICD-10-CM | POA: Diagnosis not present

## 2024-03-21 DIAGNOSIS — N186 End stage renal disease: Secondary | ICD-10-CM | POA: Diagnosis not present

## 2024-03-21 DIAGNOSIS — D631 Anemia in chronic kidney disease: Secondary | ICD-10-CM | POA: Diagnosis not present

## 2024-03-21 DIAGNOSIS — N2581 Secondary hyperparathyroidism of renal origin: Secondary | ICD-10-CM | POA: Diagnosis not present

## 2024-03-21 DIAGNOSIS — Z992 Dependence on renal dialysis: Secondary | ICD-10-CM | POA: Diagnosis not present

## 2024-03-21 MED ORDER — MOUNJARO 5 MG/0.5ML ~~LOC~~ SOAJ
5.0000 mg | SUBCUTANEOUS | 0 refills | Status: AC
  Filled 2024-03-21: qty 2, 28d supply, fill #0

## 2024-03-22 ENCOUNTER — Other Ambulatory Visit (HOSPITAL_BASED_OUTPATIENT_CLINIC_OR_DEPARTMENT_OTHER): Payer: Self-pay

## 2024-03-22 ENCOUNTER — Other Ambulatory Visit: Payer: Self-pay

## 2024-03-22 DIAGNOSIS — N2581 Secondary hyperparathyroidism of renal origin: Secondary | ICD-10-CM | POA: Diagnosis not present

## 2024-03-22 DIAGNOSIS — Z5181 Encounter for therapeutic drug level monitoring: Secondary | ICD-10-CM | POA: Diagnosis not present

## 2024-03-22 DIAGNOSIS — Z992 Dependence on renal dialysis: Secondary | ICD-10-CM | POA: Diagnosis not present

## 2024-03-22 DIAGNOSIS — D631 Anemia in chronic kidney disease: Secondary | ICD-10-CM | POA: Diagnosis not present

## 2024-03-22 DIAGNOSIS — N186 End stage renal disease: Secondary | ICD-10-CM | POA: Diagnosis not present

## 2024-03-23 DIAGNOSIS — D631 Anemia in chronic kidney disease: Secondary | ICD-10-CM | POA: Diagnosis not present

## 2024-03-23 DIAGNOSIS — N2581 Secondary hyperparathyroidism of renal origin: Secondary | ICD-10-CM | POA: Diagnosis not present

## 2024-03-23 DIAGNOSIS — N186 End stage renal disease: Secondary | ICD-10-CM | POA: Diagnosis not present

## 2024-03-23 DIAGNOSIS — Z992 Dependence on renal dialysis: Secondary | ICD-10-CM | POA: Diagnosis not present

## 2024-03-23 DIAGNOSIS — Z5181 Encounter for therapeutic drug level monitoring: Secondary | ICD-10-CM | POA: Diagnosis not present

## 2024-03-24 DIAGNOSIS — Z5181 Encounter for therapeutic drug level monitoring: Secondary | ICD-10-CM | POA: Diagnosis not present

## 2024-03-24 DIAGNOSIS — Z992 Dependence on renal dialysis: Secondary | ICD-10-CM | POA: Diagnosis not present

## 2024-03-24 DIAGNOSIS — D631 Anemia in chronic kidney disease: Secondary | ICD-10-CM | POA: Diagnosis not present

## 2024-03-24 DIAGNOSIS — N2581 Secondary hyperparathyroidism of renal origin: Secondary | ICD-10-CM | POA: Diagnosis not present

## 2024-03-24 DIAGNOSIS — N186 End stage renal disease: Secondary | ICD-10-CM | POA: Diagnosis not present

## 2024-03-25 DIAGNOSIS — Z5181 Encounter for therapeutic drug level monitoring: Secondary | ICD-10-CM | POA: Diagnosis not present

## 2024-03-25 DIAGNOSIS — Z992 Dependence on renal dialysis: Secondary | ICD-10-CM | POA: Diagnosis not present

## 2024-03-25 DIAGNOSIS — N2581 Secondary hyperparathyroidism of renal origin: Secondary | ICD-10-CM | POA: Diagnosis not present

## 2024-03-25 DIAGNOSIS — D631 Anemia in chronic kidney disease: Secondary | ICD-10-CM | POA: Diagnosis not present

## 2024-03-25 DIAGNOSIS — N186 End stage renal disease: Secondary | ICD-10-CM | POA: Diagnosis not present

## 2024-03-26 DIAGNOSIS — Z5181 Encounter for therapeutic drug level monitoring: Secondary | ICD-10-CM | POA: Diagnosis not present

## 2024-03-26 DIAGNOSIS — N186 End stage renal disease: Secondary | ICD-10-CM | POA: Diagnosis not present

## 2024-03-26 DIAGNOSIS — D631 Anemia in chronic kidney disease: Secondary | ICD-10-CM | POA: Diagnosis not present

## 2024-03-26 DIAGNOSIS — F4312 Post-traumatic stress disorder, chronic: Secondary | ICD-10-CM | POA: Diagnosis not present

## 2024-03-26 DIAGNOSIS — N2581 Secondary hyperparathyroidism of renal origin: Secondary | ICD-10-CM | POA: Diagnosis not present

## 2024-03-26 DIAGNOSIS — Z992 Dependence on renal dialysis: Secondary | ICD-10-CM | POA: Diagnosis not present

## 2024-03-27 DIAGNOSIS — Z5181 Encounter for therapeutic drug level monitoring: Secondary | ICD-10-CM | POA: Diagnosis not present

## 2024-03-27 DIAGNOSIS — Z992 Dependence on renal dialysis: Secondary | ICD-10-CM | POA: Diagnosis not present

## 2024-03-27 DIAGNOSIS — N2581 Secondary hyperparathyroidism of renal origin: Secondary | ICD-10-CM | POA: Diagnosis not present

## 2024-03-27 DIAGNOSIS — D631 Anemia in chronic kidney disease: Secondary | ICD-10-CM | POA: Diagnosis not present

## 2024-03-27 DIAGNOSIS — N186 End stage renal disease: Secondary | ICD-10-CM | POA: Diagnosis not present

## 2024-03-28 DIAGNOSIS — D631 Anemia in chronic kidney disease: Secondary | ICD-10-CM | POA: Diagnosis not present

## 2024-03-28 DIAGNOSIS — N186 End stage renal disease: Secondary | ICD-10-CM | POA: Diagnosis not present

## 2024-03-28 DIAGNOSIS — Z5181 Encounter for therapeutic drug level monitoring: Secondary | ICD-10-CM | POA: Diagnosis not present

## 2024-03-28 DIAGNOSIS — Z992 Dependence on renal dialysis: Secondary | ICD-10-CM | POA: Diagnosis not present

## 2024-03-28 DIAGNOSIS — N2581 Secondary hyperparathyroidism of renal origin: Secondary | ICD-10-CM | POA: Diagnosis not present

## 2024-03-29 DIAGNOSIS — D631 Anemia in chronic kidney disease: Secondary | ICD-10-CM | POA: Diagnosis not present

## 2024-03-29 DIAGNOSIS — Z5181 Encounter for therapeutic drug level monitoring: Secondary | ICD-10-CM | POA: Diagnosis not present

## 2024-03-29 DIAGNOSIS — N186 End stage renal disease: Secondary | ICD-10-CM | POA: Diagnosis not present

## 2024-03-29 DIAGNOSIS — Z992 Dependence on renal dialysis: Secondary | ICD-10-CM | POA: Diagnosis not present

## 2024-03-29 DIAGNOSIS — N2581 Secondary hyperparathyroidism of renal origin: Secondary | ICD-10-CM | POA: Diagnosis not present

## 2024-03-30 DIAGNOSIS — N186 End stage renal disease: Secondary | ICD-10-CM | POA: Diagnosis not present

## 2024-03-30 DIAGNOSIS — N2581 Secondary hyperparathyroidism of renal origin: Secondary | ICD-10-CM | POA: Diagnosis not present

## 2024-03-30 DIAGNOSIS — Z5181 Encounter for therapeutic drug level monitoring: Secondary | ICD-10-CM | POA: Diagnosis not present

## 2024-03-30 DIAGNOSIS — Z992 Dependence on renal dialysis: Secondary | ICD-10-CM | POA: Diagnosis not present

## 2024-03-30 DIAGNOSIS — D631 Anemia in chronic kidney disease: Secondary | ICD-10-CM | POA: Diagnosis not present

## 2024-03-31 DIAGNOSIS — N186 End stage renal disease: Secondary | ICD-10-CM | POA: Diagnosis not present

## 2024-03-31 DIAGNOSIS — Z992 Dependence on renal dialysis: Secondary | ICD-10-CM | POA: Diagnosis not present

## 2024-03-31 DIAGNOSIS — N2581 Secondary hyperparathyroidism of renal origin: Secondary | ICD-10-CM | POA: Diagnosis not present

## 2024-03-31 DIAGNOSIS — D631 Anemia in chronic kidney disease: Secondary | ICD-10-CM | POA: Diagnosis not present

## 2024-03-31 DIAGNOSIS — Z5181 Encounter for therapeutic drug level monitoring: Secondary | ICD-10-CM | POA: Diagnosis not present

## 2024-04-01 ENCOUNTER — Other Ambulatory Visit (HOSPITAL_BASED_OUTPATIENT_CLINIC_OR_DEPARTMENT_OTHER): Payer: Self-pay

## 2024-04-01 DIAGNOSIS — Z992 Dependence on renal dialysis: Secondary | ICD-10-CM | POA: Diagnosis not present

## 2024-04-01 DIAGNOSIS — D631 Anemia in chronic kidney disease: Secondary | ICD-10-CM | POA: Diagnosis not present

## 2024-04-01 DIAGNOSIS — N2581 Secondary hyperparathyroidism of renal origin: Secondary | ICD-10-CM | POA: Diagnosis not present

## 2024-04-01 DIAGNOSIS — Z5181 Encounter for therapeutic drug level monitoring: Secondary | ICD-10-CM | POA: Diagnosis not present

## 2024-04-01 DIAGNOSIS — N186 End stage renal disease: Secondary | ICD-10-CM | POA: Diagnosis not present

## 2024-04-02 DIAGNOSIS — Z5181 Encounter for therapeutic drug level monitoring: Secondary | ICD-10-CM | POA: Diagnosis not present

## 2024-04-02 DIAGNOSIS — Z992 Dependence on renal dialysis: Secondary | ICD-10-CM | POA: Diagnosis not present

## 2024-04-02 DIAGNOSIS — D631 Anemia in chronic kidney disease: Secondary | ICD-10-CM | POA: Diagnosis not present

## 2024-04-02 DIAGNOSIS — N2581 Secondary hyperparathyroidism of renal origin: Secondary | ICD-10-CM | POA: Diagnosis not present

## 2024-04-02 DIAGNOSIS — N186 End stage renal disease: Secondary | ICD-10-CM | POA: Diagnosis not present

## 2024-04-03 ENCOUNTER — Other Ambulatory Visit (HOSPITAL_BASED_OUTPATIENT_CLINIC_OR_DEPARTMENT_OTHER): Payer: Self-pay

## 2024-04-03 DIAGNOSIS — N186 End stage renal disease: Secondary | ICD-10-CM | POA: Diagnosis not present

## 2024-04-03 DIAGNOSIS — Z992 Dependence on renal dialysis: Secondary | ICD-10-CM | POA: Diagnosis not present

## 2024-04-03 DIAGNOSIS — D631 Anemia in chronic kidney disease: Secondary | ICD-10-CM | POA: Diagnosis not present

## 2024-04-03 DIAGNOSIS — Z5181 Encounter for therapeutic drug level monitoring: Secondary | ICD-10-CM | POA: Diagnosis not present

## 2024-04-03 DIAGNOSIS — N2581 Secondary hyperparathyroidism of renal origin: Secondary | ICD-10-CM | POA: Diagnosis not present

## 2024-04-03 MED ORDER — AMLODIPINE BESYLATE 10 MG PO TABS
10.0000 mg | ORAL_TABLET | Freq: Every day | ORAL | 3 refills | Status: AC
  Filled 2024-04-03: qty 90, 90d supply, fill #0

## 2024-04-04 DIAGNOSIS — N186 End stage renal disease: Secondary | ICD-10-CM | POA: Diagnosis not present

## 2024-04-04 DIAGNOSIS — Z5181 Encounter for therapeutic drug level monitoring: Secondary | ICD-10-CM | POA: Diagnosis not present

## 2024-04-04 DIAGNOSIS — Z992 Dependence on renal dialysis: Secondary | ICD-10-CM | POA: Diagnosis not present

## 2024-04-04 DIAGNOSIS — N2581 Secondary hyperparathyroidism of renal origin: Secondary | ICD-10-CM | POA: Diagnosis not present

## 2024-04-04 DIAGNOSIS — D631 Anemia in chronic kidney disease: Secondary | ICD-10-CM | POA: Diagnosis not present

## 2024-04-05 DIAGNOSIS — Z5181 Encounter for therapeutic drug level monitoring: Secondary | ICD-10-CM | POA: Diagnosis not present

## 2024-04-05 DIAGNOSIS — Z992 Dependence on renal dialysis: Secondary | ICD-10-CM | POA: Diagnosis not present

## 2024-04-05 DIAGNOSIS — D631 Anemia in chronic kidney disease: Secondary | ICD-10-CM | POA: Diagnosis not present

## 2024-04-05 DIAGNOSIS — N2581 Secondary hyperparathyroidism of renal origin: Secondary | ICD-10-CM | POA: Diagnosis not present

## 2024-04-05 DIAGNOSIS — N186 End stage renal disease: Secondary | ICD-10-CM | POA: Diagnosis not present

## 2024-04-06 DIAGNOSIS — Z5181 Encounter for therapeutic drug level monitoring: Secondary | ICD-10-CM | POA: Diagnosis not present

## 2024-04-06 DIAGNOSIS — Z992 Dependence on renal dialysis: Secondary | ICD-10-CM | POA: Diagnosis not present

## 2024-04-06 DIAGNOSIS — N2581 Secondary hyperparathyroidism of renal origin: Secondary | ICD-10-CM | POA: Diagnosis not present

## 2024-04-06 DIAGNOSIS — N186 End stage renal disease: Secondary | ICD-10-CM | POA: Diagnosis not present

## 2024-04-06 DIAGNOSIS — D631 Anemia in chronic kidney disease: Secondary | ICD-10-CM | POA: Diagnosis not present

## 2024-04-07 DIAGNOSIS — Z5181 Encounter for therapeutic drug level monitoring: Secondary | ICD-10-CM | POA: Diagnosis not present

## 2024-04-07 DIAGNOSIS — N2581 Secondary hyperparathyroidism of renal origin: Secondary | ICD-10-CM | POA: Diagnosis not present

## 2024-04-07 DIAGNOSIS — D631 Anemia in chronic kidney disease: Secondary | ICD-10-CM | POA: Diagnosis not present

## 2024-04-07 DIAGNOSIS — Z992 Dependence on renal dialysis: Secondary | ICD-10-CM | POA: Diagnosis not present

## 2024-04-07 DIAGNOSIS — N186 End stage renal disease: Secondary | ICD-10-CM | POA: Diagnosis not present

## 2024-04-08 DIAGNOSIS — D631 Anemia in chronic kidney disease: Secondary | ICD-10-CM | POA: Diagnosis not present

## 2024-04-08 DIAGNOSIS — Z5181 Encounter for therapeutic drug level monitoring: Secondary | ICD-10-CM | POA: Diagnosis not present

## 2024-04-08 DIAGNOSIS — N2581 Secondary hyperparathyroidism of renal origin: Secondary | ICD-10-CM | POA: Diagnosis not present

## 2024-04-08 DIAGNOSIS — Z992 Dependence on renal dialysis: Secondary | ICD-10-CM | POA: Diagnosis not present

## 2024-04-08 DIAGNOSIS — N186 End stage renal disease: Secondary | ICD-10-CM | POA: Diagnosis not present

## 2024-04-09 DIAGNOSIS — D631 Anemia in chronic kidney disease: Secondary | ICD-10-CM | POA: Diagnosis not present

## 2024-04-09 DIAGNOSIS — N186 End stage renal disease: Secondary | ICD-10-CM | POA: Diagnosis not present

## 2024-04-09 DIAGNOSIS — Z992 Dependence on renal dialysis: Secondary | ICD-10-CM | POA: Diagnosis not present

## 2024-04-09 DIAGNOSIS — N2581 Secondary hyperparathyroidism of renal origin: Secondary | ICD-10-CM | POA: Diagnosis not present

## 2024-04-09 DIAGNOSIS — Z5181 Encounter for therapeutic drug level monitoring: Secondary | ICD-10-CM | POA: Diagnosis not present

## 2024-04-10 DIAGNOSIS — D631 Anemia in chronic kidney disease: Secondary | ICD-10-CM | POA: Diagnosis not present

## 2024-04-10 DIAGNOSIS — N186 End stage renal disease: Secondary | ICD-10-CM | POA: Diagnosis not present

## 2024-04-10 DIAGNOSIS — Z992 Dependence on renal dialysis: Secondary | ICD-10-CM | POA: Diagnosis not present

## 2024-04-10 DIAGNOSIS — Z5181 Encounter for therapeutic drug level monitoring: Secondary | ICD-10-CM | POA: Diagnosis not present

## 2024-04-10 DIAGNOSIS — N2581 Secondary hyperparathyroidism of renal origin: Secondary | ICD-10-CM | POA: Diagnosis not present

## 2024-04-11 DIAGNOSIS — D631 Anemia in chronic kidney disease: Secondary | ICD-10-CM | POA: Diagnosis not present

## 2024-04-11 DIAGNOSIS — Z992 Dependence on renal dialysis: Secondary | ICD-10-CM | POA: Diagnosis not present

## 2024-04-11 DIAGNOSIS — N186 End stage renal disease: Secondary | ICD-10-CM | POA: Diagnosis not present

## 2024-04-11 DIAGNOSIS — N2581 Secondary hyperparathyroidism of renal origin: Secondary | ICD-10-CM | POA: Diagnosis not present

## 2024-04-11 DIAGNOSIS — Z5181 Encounter for therapeutic drug level monitoring: Secondary | ICD-10-CM | POA: Diagnosis not present

## 2024-04-12 DIAGNOSIS — D631 Anemia in chronic kidney disease: Secondary | ICD-10-CM | POA: Diagnosis not present

## 2024-04-12 DIAGNOSIS — Z5181 Encounter for therapeutic drug level monitoring: Secondary | ICD-10-CM | POA: Diagnosis not present

## 2024-04-12 DIAGNOSIS — N2581 Secondary hyperparathyroidism of renal origin: Secondary | ICD-10-CM | POA: Diagnosis not present

## 2024-04-12 DIAGNOSIS — Z992 Dependence on renal dialysis: Secondary | ICD-10-CM | POA: Diagnosis not present

## 2024-04-12 DIAGNOSIS — N186 End stage renal disease: Secondary | ICD-10-CM | POA: Diagnosis not present

## 2024-04-13 DIAGNOSIS — Z5181 Encounter for therapeutic drug level monitoring: Secondary | ICD-10-CM | POA: Diagnosis not present

## 2024-04-13 DIAGNOSIS — Z992 Dependence on renal dialysis: Secondary | ICD-10-CM | POA: Diagnosis not present

## 2024-04-13 DIAGNOSIS — N2581 Secondary hyperparathyroidism of renal origin: Secondary | ICD-10-CM | POA: Diagnosis not present

## 2024-04-13 DIAGNOSIS — N186 End stage renal disease: Secondary | ICD-10-CM | POA: Diagnosis not present

## 2024-04-13 DIAGNOSIS — D631 Anemia in chronic kidney disease: Secondary | ICD-10-CM | POA: Diagnosis not present

## 2024-04-14 DIAGNOSIS — N186 End stage renal disease: Secondary | ICD-10-CM | POA: Diagnosis not present

## 2024-04-14 DIAGNOSIS — Z5181 Encounter for therapeutic drug level monitoring: Secondary | ICD-10-CM | POA: Diagnosis not present

## 2024-04-14 DIAGNOSIS — Z992 Dependence on renal dialysis: Secondary | ICD-10-CM | POA: Diagnosis not present

## 2024-04-14 DIAGNOSIS — D631 Anemia in chronic kidney disease: Secondary | ICD-10-CM | POA: Diagnosis not present

## 2024-04-14 DIAGNOSIS — E1122 Type 2 diabetes mellitus with diabetic chronic kidney disease: Secondary | ICD-10-CM | POA: Diagnosis not present

## 2024-04-14 DIAGNOSIS — N2581 Secondary hyperparathyroidism of renal origin: Secondary | ICD-10-CM | POA: Diagnosis not present

## 2024-04-17 ENCOUNTER — Other Ambulatory Visit (HOSPITAL_BASED_OUTPATIENT_CLINIC_OR_DEPARTMENT_OTHER): Payer: Self-pay

## 2024-04-17 DIAGNOSIS — N186 End stage renal disease: Secondary | ICD-10-CM | POA: Diagnosis not present

## 2024-04-17 DIAGNOSIS — E1122 Type 2 diabetes mellitus with diabetic chronic kidney disease: Secondary | ICD-10-CM | POA: Diagnosis not present

## 2024-04-17 DIAGNOSIS — K036 Deposits [accretions] on teeth: Secondary | ICD-10-CM | POA: Diagnosis not present

## 2024-04-17 DIAGNOSIS — I10 Essential (primary) hypertension: Secondary | ICD-10-CM | POA: Diagnosis not present

## 2024-04-17 DIAGNOSIS — I5189 Other ill-defined heart diseases: Secondary | ICD-10-CM | POA: Diagnosis not present

## 2024-04-17 MED ORDER — MOUNJARO 7.5 MG/0.5ML ~~LOC~~ SOAJ
7.5000 mg | SUBCUTANEOUS | 1 refills | Status: AC
  Filled 2024-04-17: qty 2, 28d supply, fill #0

## 2024-04-23 ENCOUNTER — Telehealth: Payer: Self-pay

## 2024-04-23 DIAGNOSIS — Z7189 Other specified counseling: Secondary | ICD-10-CM

## 2024-04-23 NOTE — Progress Notes (Signed)
 Complex Care Management Note  Care Guide Note 04/23/2024 Name: Jemuel Laursen MRN: [id number] DOB: [birth year]  Ahmaud Nayan Proch is a 53 y.o. year old male who sees Charlott Dorn LABOR, MD for primary care. I reached out to Charlie Debby Clonts by phone today to offer complex care management services.  Mr. Kussman was given information about Complex Care Management services today including:   The Complex Care Management services include support from the care team which includes your Nurse Care Manager, Clinical Social Worker, or Pharmacist.  The Complex Care Management team is here to help remove barriers to the health concerns and goals most important to you. Complex Care Management services are voluntary, and the patient may decline or stop services at any time by request to their care team member.   Complex Care Management Consent Status: Patient did not agree to participate in complex care management services at this time.  Follow up plan:    Encounter Outcome:  Patient Refused  Jeoffrey Buffalo , RMA     [facility]  [facility] Guide  Direct Dial: [phone]  Website: [email or web address]

## 2024-04-24 ENCOUNTER — Telehealth (HOSPITAL_BASED_OUTPATIENT_CLINIC_OR_DEPARTMENT_OTHER): Payer: Self-pay

## 2024-04-24 DIAGNOSIS — K080 Exfoliation of teeth due to systemic causes: Secondary | ICD-10-CM | POA: Diagnosis not present

## 2024-04-24 NOTE — Telephone Encounter (Signed)
 Caller Martene) requests that their clearance form they forwarded be completed and faxed back to complete patient clearance.  Caller provided fax# [phone].

## 2024-04-24 NOTE — Telephone Encounter (Signed)
° °  Patient Name: Adrian Frye  DOB: 1970/07/30 MRN: [id number]  Primary Cardiologist: None  Chart reviewed as part of pre-operative protocol coverage.   Simple dental procedures including extractions (i.e. 1-2 teeth) and root canals are considered low risk procedures per guidelines and generally do not require any specific cardiac clearance. It is also generally accepted that for simple extractions and dental cleanings, there is no need to interrupt blood thinner therapy.   SBE prophylaxis is required for the patient from a cardiac standpoint. This is because patient had a PV replacement in 11/2023  I will route this recommendation to the requesting party via Epic fax function and remove from pre-op  pool.  Please call with questions.  [name], PA-C 04/24/2024, 3:28 PM

## 2024-04-24 NOTE — Telephone Encounter (Signed)
° °  Pre-operative Risk Assessment    Patient Name: Adrian Frye  DOB: [birth year] MRN: [id number]   Date of last office visit: 09/26/23 with Dr. Shlomo Date of next office visit: NA  Request for Surgical Clearance    Procedure:  Root Canal   Date of Surgery:  Clearance 04/27/24                                 Surgeon:  NA Surgeon's Group or Practice Name:  Tena Sorrow, DDS, PA Phone number:  [phone] Fax number:  [phone]   Type of Clearance Requested:   - Medical  - Pharmacy:  Hold Aspirin  not indicated   Type of Anesthesia:  Local   Additional requests/questions:    SignedAugustin JONETTA Daring   04/24/2024, 2:44 PM

## 2024-04-25 NOTE — Telephone Encounter (Signed)
 Clearance request received from any office is entered into the pt chart. Our cardiologist and preop APP have a format they use to keep consistency. Our practice has 7 off locations that our preo team handles all clearances for all of our cardiologist. To keep this in uniform and consistent the team designed a format. It is always our goal to be sure all information is correct in the chart for the preop team to review. We apologize if anything has been missed.   I do see for [name] he is having a root canal with Dr. Janice, DDS, hold ASA for procedure, ph# [phone], fax# [phone], anesthesia -local  This has been our process for almost 7 yrs now.

## 2024-05-07 ENCOUNTER — Other Ambulatory Visit (HOSPITAL_BASED_OUTPATIENT_CLINIC_OR_DEPARTMENT_OTHER): Payer: Self-pay

## 2024-05-07 MED ORDER — MOUNJARO 10 MG/0.5ML ~~LOC~~ SOAJ
10.0000 mg | SUBCUTANEOUS | 1 refills | Status: AC
  Filled 2024-05-07 – 2024-05-13 (×2): qty 2, 28d supply, fill #0

## 2024-05-13 ENCOUNTER — Other Ambulatory Visit (HOSPITAL_BASED_OUTPATIENT_CLINIC_OR_DEPARTMENT_OTHER): Payer: Self-pay

## 2024-05-17 ENCOUNTER — Other Ambulatory Visit (HOSPITAL_BASED_OUTPATIENT_CLINIC_OR_DEPARTMENT_OTHER): Payer: Self-pay

## 2024-05-22 ENCOUNTER — Ambulatory Visit
Admission: RE | Admit: 2024-05-22 | Discharge: 2024-05-22 | Disposition: A | Discharge status: Home / Self Care | Source: Ambulatory Visit | Attending: Nephrology | Admitting: Nephrology

## 2024-05-22 ENCOUNTER — Other Ambulatory Visit: Payer: Self-pay | Admitting: Nephrology

## 2024-05-22 DIAGNOSIS — Z466 Encounter for fitting and adjustment of urinary device: Secondary | ICD-10-CM

## 2024-05-23 ENCOUNTER — Encounter (HOSPITAL_COMMUNITY): Payer: Self-pay | Admitting: Vascular Surgery

## 2024-05-24 ENCOUNTER — Ambulatory Visit (HOSPITAL_COMMUNITY)
Admission: RE | Admit: 2024-05-24 | Discharge: 2024-05-24 | Disposition: A | Discharge status: Home / Self Care | Attending: Vascular Surgery | Admitting: Vascular Surgery

## 2024-05-24 ENCOUNTER — Other Ambulatory Visit: Payer: Self-pay

## 2024-05-24 ENCOUNTER — Encounter (HOSPITAL_COMMUNITY)
Admission: RE | Disposition: A | Discharge status: Home / Self Care | Payer: Self-pay | Source: Home / Self Care | Attending: Vascular Surgery

## 2024-05-24 DIAGNOSIS — I132 Hypertensive heart and chronic kidney disease with heart failure and with stage 5 chronic kidney disease, or end stage renal disease: Secondary | ICD-10-CM | POA: Diagnosis present

## 2024-05-24 DIAGNOSIS — Z992 Dependence on renal dialysis: Secondary | ICD-10-CM | POA: Insufficient documentation

## 2024-05-24 DIAGNOSIS — Z79899 Other long term (current) drug therapy: Secondary | ICD-10-CM | POA: Diagnosis not present

## 2024-05-24 DIAGNOSIS — Z87891 Personal history of nicotine dependence: Secondary | ICD-10-CM | POA: Insufficient documentation

## 2024-05-24 DIAGNOSIS — I509 Heart failure, unspecified: Secondary | ICD-10-CM | POA: Diagnosis not present

## 2024-05-24 DIAGNOSIS — Z7982 Long term (current) use of aspirin: Secondary | ICD-10-CM | POA: Insufficient documentation

## 2024-05-24 DIAGNOSIS — N186 End stage renal disease: Secondary | ICD-10-CM | POA: Insufficient documentation

## 2024-05-24 HISTORY — PX: DIALYSIS/PERMA CATHETER INSERTION: CATH118288

## 2024-05-24 SURGERY — DIALYSIS/PERMA CATHETER INSERTION
Anesthesia: LOCAL | Site: Chest

## 2024-05-24 MED ORDER — HEPARIN SODIUM (PORCINE) 1000 UNIT/ML IJ SOLN
INTRAMUSCULAR | Status: DC | PRN
  Administered 2024-05-24 (×2): 1600 [IU] via INTRAVENOUS

## 2024-05-24 MED ORDER — LIDOCAINE HCL (PF) 1 % IJ SOLN
INTRAMUSCULAR | Status: DC | PRN
  Administered 2024-05-24: 5 mL
  Administered 2024-05-24: 15 mL

## 2024-05-24 MED ORDER — HEPARIN (PORCINE) IN NACL 1000-0.9 UT/500ML-% IV SOLN
INTRAVENOUS | Status: DC | PRN
  Administered 2024-05-24: 500 mL

## 2024-05-24 MED ORDER — LIDOCAINE HCL (PF) 1 % IJ SOLN
INTRAMUSCULAR | Status: AC
  Filled 2024-05-24: qty 30

## 2024-05-24 MED ORDER — HEPARIN SODIUM (PORCINE) 1000 UNIT/ML IJ SOLN
INTRAMUSCULAR | Status: AC
  Filled 2024-05-24: qty 10

## 2024-05-24 SURGICAL SUPPLY — 5 items
CATH PALINDROME-P 19CM W/VT (CATHETERS) IMPLANT
KIT MICROPUNCTURE NIT STIFF (SHEATH) IMPLANT
KIT PV (KITS) ×1 IMPLANT
SHEATH PROBE COVER 6X72 (BAG) IMPLANT
TRAY PV CATH (CUSTOM PROCEDURE TRAY) ×1 IMPLANT

## 2024-05-24 NOTE — Op Note (Signed)
" ° ° °  Patient name: Adrian Frye MRN: [id number] DOB: 01-08-1971 Sex: male  05/24/2024 Pre-operative Diagnosis: End-stage renal disease with need for hemodialysis access Post-operative diagnosis:  Same Surgeon:  Lonni DOROTHA Gaskins, MD Procedure Performed: 1.  Ultrasound-guided access right internal jugular vein 2.  Placement of right internal jugular vein tunneled dialysis catheter (19 cm palindrome)  Indications: Patient is a 54 year old male with end-stage renal disease previously using a PD catheter.  He has malfunction of the PD catheter and presents for tunneled dialysis catheter placement after risk-benefits discussed.  Findings:   Ultrasound-guided access right internal jugular vein with placement of a 19 cm palindrome catheter with the tip in the right atrium and this flushed and aspirated easily   Procedure:  The patient was identified in the holding area and taken to [facility] PV lab.  Placed on the table in supine position.  We did evaluate his right internal jugular vein with ultrasound, it was patent, an image was saved.  Ultimate the right neck was prepped and draped in standard sterile fashion.  I used 1% lidocaine  without epinephrine  to anesthetize the skin over the right IJ as well as the chest wall insertion site.  We then accessed the right internal jugular vein with ultrasound guidance with micro access needle placed a microwire and a micro sheath.  I then advanced a J-wire into the right atrium under fluoroscopic guidance.  I measured a 19 cm palindrome catheter on the right chest wall.  A counterincision was made on the chest wall and we tunneled from the catheter exit site to the IJ stick site.  I then dilated over the wire and placed a large dilator peel-away sheath into the right atrium.  The inner dilator was removed and we advanced the catheter into the right atrium and peeled the sheath away.  This was positioned under fluoroscopy so it was not kinked.  Flushed and  aspirated easily.  Loaded with heparin  according to manufactures recommendations.  The neck incision was closed with 4-0 Monocryl and the chest catheter exit site was closed with a 2-0 nylon.  Dermabond was applied.     Lonni DOROTHA Gaskins, MD [facility] [place] Office: [phone]   "

## 2024-05-24 NOTE — H&P (Signed)
 " H&P    History of Present Illness: This is a 54 y.o. male with ESRD that presents for tunneled dialysis catheter placement.  Has a PD catheter that is not functioning well at the current time.  Previously had a right IJ TDC placed on 02/08/2024 by Dr. Sheree that was removed.  Past Medical History:  Diagnosis Date   Allergic rhinitis    Allergy    Anemia    Asthma    as a child   Bacteremia    Benign essential hypertension    Carotid artery stenosis, asymptomatic, left    1-39% left by dopplers 10/2022   CHF (congestive heart failure) (HCC)    Dilated idiopathic cardiomyopathy (HCC)    now resoved with EF 55% by echo 2011   Edema    Encounter for blood transfusion    Endocarditis    ESRD (end stage renal disease) (HCC)    ESRD on peritoneal dialysis (HCC)    Heart disease    Hyperlipidemia    Hyperparathyroidism, secondary    Hypertension    Morbid obesity (HCC)    Sleep apnea    c-pap   UGI bleed 2011   ASA    Past Surgical History:  Procedure Laterality Date   ACHILLES TENDON REPAIR     ruptured; right   AV FISTULA PLACEMENT Left 11/03/2017   Procedure: ARTERIOVENOUS (AV) FISTULA CREATION  left upper arm;  Surgeon: Oris Krystal FALCON, MD;  Location: [facility] OR;  Service: Vascular;  Laterality: Left;   COLONOSCOPY     COLONOSCOPY WITH PROPOFOL  N/A 08/11/2022   Procedure: COLONOSCOPY WITH PROPOFOL ;  Surgeon: Abran Norleen SAILOR, MD;  Location: [facility];  Service: Gastroenterology;  Laterality: N/A;   DIALYSIS FISTULA CREATION     DIALYSIS/PERMA CATHETER INSERTION N/A 02/08/2024   Procedure: DIALYSIS/PERMA CATHETER INSERTION;  Surgeon: Sheree Penne Bruckner, MD;  Location: [facility];  Service: Cardiovascular;  Laterality: N/A;   INSERTION OF DIALYSIS CATHETER N/A 11/03/2017   Procedure: INSERTION OF DIALYSIS CATHETER - RIGHT INTERNAL JUGULAR PLACEMENT;  Surgeon: Oris Krystal FALCON, MD;  Location: [facility] OR;  Service: Vascular;  Laterality: N/A;   IR FLUORO GUIDE CV LINE LEFT   01/24/2020   IR REMOVAL TUN CV CATH W/O FL  01/21/2020   IR REMOVAL TUN CV CATH W/O FL  03/06/2020   IR REMOVAL TUN CV CATH W/O FL  03/14/2024   IR US  GUIDE VASC ACCESS LEFT  01/24/2020   KIDNEY FAILURE     LAPAROSCOPIC GASTROTOMY W/ REPAIR OF ULCER     PLEURAL SCARIFICATION     left, football trauma-chest tube   STOMACH SURGERY     TEE WITHOUT CARDIOVERSION N/A 01/22/2020   Procedure: TRANSESOPHAGEAL ECHOCARDIOGRAM (TEE);  Surgeon: Mona Vinie BROCKS, MD;  Location[facility];  Service: Cardiovascular;  Laterality: N/A;   TEE WITHOUT CARDIOVERSION N/A 10/31/2022   Procedure: TRANSESOPHAGEAL ECHOCARDIOGRAM;  Surgeon: Shlomo Wilbert SAUNDERS, MD;  Location: [facility];  Service: Cardiovascular;  Laterality: N/A;   TONSILLECTOMY     TONSILLECTOMY     UPPER GASTROINTESTINAL ENDOSCOPY      Allergies[1]  Prior to Admission medications  Medication Sig Start Date End Date Taking? Authorizing Provider  albuterol  (VENTOLIN  HFA) 108 (90 Base) MCG/ACT inhaler TAKE 2 PUFFS BY MOUTH EVERY 6 HOURS AS NEEDED FOR WHEEZE OR SHORTNESS OF BREATH Patient taking differently: Inhale 3 puffs into the lungs daily as needed for wheezing or shortness of breath. 09/14/20   Lendia Boby CROME, NP-C  amLODipine  (NORVASC ) 10 MG tablet Take 1 tablet (10 mg total) by mouth daily. 01/01/24     amLODipine  (NORVASC ) 10 MG tablet Take 1 tablet (10 mg total) by mouth daily. 04/03/24     amoxicillin  (AMOXIL ) 500 MG capsule Take 4 capsules (2,000 mg total) by mouth as needed (Take dose 30-60 minutes before dental appointments for endocarditis prophylaxis-heart valve infection prevention) 12/15/23     aspirin  81 MG chewable tablet Chew 1 tablet (81 mg total) by mouth daily. 12/15/23     aspirin  EC 81 MG tablet Take 1 tablet (81 mg total) by mouth daily. Swallow whole. 09/26/23   Shlomo Wilbert SAUNDERS, MD  atorvastatin  (LIPITOR) 40 MG tablet Take 1 tablet (40 mg total) by mouth at bedtime. 12/15/23     AURYXIA  1 GM 210 MG(Fe) tablet Take 630  mg by mouth 3 (three) times daily with meals. 07/05/21   [provider]  B Complex-C-Folic Acid  (DIALYVITE 800) 0.8 MG TABS Take 1 tablet by mouth daily.    [provider]  calcitRIOL  (ROCALTROL ) 0.25 MCG capsule Take 0.25 mcg by mouth daily.    [provider]  carvedilol  (COREG ) 25 MG tablet Take 1 tablet (25 mg total) by mouth 2 (two) times daily. 11/14/23   Marlee Bernardino NOVAK, MD  Continuous Glucose Sensor (DEXCOM G7 SENSOR) MISC Apply sensor to skin every 10 days 05/19/23     gentamicin  cream (GARAMYCIN ) 0.1 % Apply 1 Application topically daily as needed (catheter dressing change).    [provider]  hydrALAZINE  (APRESOLINE ) 50 MG tablet Take 1.5 tablets (75 mg total) by mouth 3 (three) times daily. 01/03/24 06/05/24  Shlomo Wilbert SAUNDERS, MD  HYDROcodone -acetaminophen  (NORCO/VICODIN) 5-325 MG tablet Take one tablet by mouth every 6 (six) hours as needed for up to 3 days. Max daily amounts 4 tablet 01/29/24     lactulose  (CHRONULAC ) 10 GM/15ML solution Take 60 mLs (40 g total) by mouth every 1 hours for max of 4 doses. If  no relief call RN/MD. 09/08/23     lactulose  (CHRONULAC ) 10 GM/15ML solution Take 30 ml by mouth 3 (three) times daily. 02/05/24   Dennise Hoes, MD  liothyronine  (CYTOMEL ) 5 MCG tablet Take 2 tablets (10 mcg total) by mouth every morning 30 minutes before food, drink or supplements. 09/25/23     METAMUCIL FIBER PO Take 2 Scoops by mouth daily.    [provider]  methocarbamol  (ROBAXIN ) 500 MG tablet Take 1 tablet (500 mg total) by mouth 3 (three) times daily for 4 days 01/29/24     METHYLENE BLUE PO Take 1 tablet by mouth daily.    [provider]  ondansetron  (ZOFRAN ) 4 MG tablet Take 1 tablet (4 mg total) by mouth every 8 (eight) hours as needed for up to 4 days 01/29/24     OVER THE COUNTER MEDICATION Take 1 tablet by mouth daily. Bergavin Lopid    [provider]  OVER THE COUNTER MEDICATION Take 3,000 mg by mouth daily.  Omegavail 3000 mg    [provider]  OVER THE COUNTER MEDICATION Take 1 tablet by mouth daily. Homocysteine    [provider]  OVER THE COUNTER MEDICATION Take 10,000 Units by mouth daily. D-evail 10,000 units    [provider]  OVER THE COUNTER MEDICATION Take 1 tablet by mouth daily. Natto    [provider]  OVER THE COUNTER MEDICATION Take 1 tablet by mouth daily. Iberogast    [provider]  polyethylene glycol powder (GLYCOLAX /MIRALAX ) 17 GM/SCOOP powder Take 17 g by mouth daily.    [provider]  potassium chloride  (KLOR-CON  M) 10 MEQ tablet Take 3 tablets (30 mEq total) by mouth daily. 09/14/23   Pokhrel, Laxman, MD  rifampin  (RIFADIN ) 300 MG capsule Take 2 capsule by mouth once a day 10/25/23     spironolactone  (ALDACTONE ) 25 MG tablet Take 1 tablet (25 mg total) by mouth daily. 07/31/23     Tenapanor HCl, CKD, (XPHOZAH) 30 MG TABS Take 30 mg by mouth daily.    [provider]  tirzepatide  (MOUNJARO ) 10 MG/0.5ML Pen Inject 10 mg into the skin once a week. 05/07/24     tirzepatide  (MOUNJARO ) 5 MG/0.5ML Pen Inject 5 mg into the skin once a week. 10/05/23     tirzepatide  (MOUNJARO ) 5 MG/0.5ML Pen Inject 5 mg into the skin once a week. 02/21/24     tirzepatide  (MOUNJARO ) 5 MG/0.5ML Pen Inject 5 mg into the skin once a week. 03/21/24     tirzepatide  (MOUNJARO ) 7.5 MG/0.5ML Pen Inject 7.5 mg into the skin once a week. 04/17/24     topiramate  (TOPAMAX ) 25 MG tablet Take 1 tablet (25 mg total) by mouth daily. 01/11/24     traZODone  (DESYREL ) 50 MG tablet Take 4 tablets (200 mg total) by mouth at bedtime. 08/28/23       Social History   Socioeconomic History   Marital status: Married    Spouse name: Not on file   Number of children: 2   Years of education: Not on file   Highest education level: Not on file  Occupational History   Occupation: real psychologist, occupational  Tobacco Use   Smoking status: Former    Current packs/day: 0.00     Types: Cigarettes    Start date: 05/17/2003    Quit date: 05/16/2009    Years since quitting: 15.0   Smokeless tobacco: Never   Tobacco comments:    smoked 1 pack per week   Vaping Use   Vaping status: Never Used  Substance and Sexual Activity   Alcohol use: Not Currently    Alcohol/week: 2.0 standard drinks of alcohol    Types: 2 Cans of beer per week    Comment: occasionally   Drug use: No   Sexual activity: Yes  Other Topics Concern   Not on file  Social History Narrative   Not on file   Social Drivers of Health   Tobacco Use: Medium Risk (02/18/2024)   Received from [facility]   Patient History    Passive Exposure: Never    Smokeless Tobacco Use: Never    Smoking Tobacco Use: Former  Programmer, Applications: Patient Declined (01/18/2024)   Received from [employer]   Overall Financial Resource Strain (CARDIA)    How hard is it for you to pay for the very basics like food, housing, medical care, and heating?: Patient declined  Food Insecurity: Patient Declined (01/18/2024)   Received from [facility]   Epic    Within the past 12 months, you worried that your food would run out before you got the money to buy more.: Patient declined    Within the past 12 months, the food you bought just didn't last and you didn't have money to get more.: Patient declined  Transportation Needs: Patient Declined (01/18/2024)   Received from [facility]    In the past 12 months, has lack of transportation kept you from medical appointments  or from getting medications?: Patient declined    In the past 12 months, has lack of transportation kept you from meetings, work, or from getting things needed for daily living?: Patient declined  Physical Activity: Sufficiently Active (03/11/2022)   Exercise Vital Sign    Days of Exercise per Week: 3 days    Minutes of Exercise per Session: 60 min  Stress: No Stress Concern Present (03/11/2022)   [employer] of Occupational [employer] -  Occupational Stress Questionnaire    Feeling of Stress : Not at all  Social Connections: Not on file  Intimate Partner Violence: Not At Risk (09/09/2023)   Humiliation, Afraid, Rape, and Kick questionnaire    Fear of Current or Ex-Partner: No    Emotionally Abused: No    Physically Abused: No    Sexually Abused: No  Depression (PHQ2-9): Low Risk (03/11/2022)   Depression (PHQ2-9)    PHQ-2 Score: 0  Alcohol Screen: Not on file  Housing: Unknown (01/18/2024)   Received from [facility]    In the last 12 months, was there a time when you were not able to pay the mortgage or rent on time?: Patient declined    In the past 12 months, how many times have you moved where you were living?: 0    At any time in the past 12 months, were you homeless or living in a shelter (including now)?: Patient declined  Utilities: Patient Declined (01/18/2024)   Received from [facility]    In the past 12 months has the electric, gas, oil, or water company threatened to shut off services in your home?: Patient declined  Health Literacy: Not on file     Family History  Problem Relation Age of Onset   Diabetes Mother    Heart failure Mother    Hypertension Mother    Heart disease Father    Hypertension Brother    Hypertension Brother    Colon cancer Neg Hx    Esophageal cancer Neg Hx    Rectal cancer Neg Hx    Stomach cancer Neg Hx     ROS: [x]  Positive   [ ]  Negative   [ ]  All sytems reviewed and are negative  Cardiovascular: []  chest pain/pressure []  palpitations []  SOB lying flat []  DOE []  pain in legs while walking []  pain in legs at rest []  pain in legs at night []  non-healing ulcers []  hx of DVT []  swelling in legs  Pulmonary: []  productive cough []  asthma/wheezing []  home O2  Neurologic: []  weakness in []  arms []  legs []  numbness in []  arms []  legs []  hx of CVA []  mini stroke [] difficulty speaking or slurred speech []  temporary loss of vision in one eye []   dizziness  Hematologic: []  hx of cancer []  bleeding problems []  problems with blood clotting easily  Endocrine:   []  diabetes []  thyroid  disease  GI []  vomiting blood []  blood in stool  GU: []  CKD/renal failure []  HD--[]  M/W/F or []  T/T/S []  burning with urination []  blood in urine  Psychiatric: []  anxiety []  depression  Musculoskeletal: []  arthritis []  joint pain  Integumentary: []  rashes []  ulcers  Constitutional: []  fever []  chills   Physical Examination  Vitals:   05/24/24 0739 05/24/24 0750  BP: (!) 168/84 (!) 161/84  Pulse: 79 78  Resp: 12 16  Temp: 97.9 F (36.6 C)   SpO2: 92% 93%   There is no height or weight on file  to calculate BMI.  General:  WDWN in NAD Gait: Not observed HENT: WNL, normocephalic Pulmonary: normal non-labored breathing Cardiac: regular, without  Murmurs, rubs or gallops Vascular Exam/Pulses: PD catheter   CBC    Component Value Date/Time   WBC 8.0 09/14/2023 0701   RBC 2.95 (L) 09/14/2023 0701   HGB 8.9 (L) 09/14/2023 0701   HGB 9.5 (L) 10/26/2022 1432   HCT 26.7 (L) 09/14/2023 0701   HCT 30.0 (L) 10/26/2022 1432   PLT 209 09/14/2023 0701   PLT 132 (L) 10/26/2022 1432   MCV 90.5 09/14/2023 0701   MCV 89 10/26/2022 1432   MCH 30.2 09/14/2023 0701   MCHC 33.3 09/14/2023 0701   RDW 16.2 (H) 09/14/2023 0701   RDW 16.2 (H) 10/26/2022 1432   LYMPHSABS 1.1 09/13/2023 0617   MONOABS 0.8 09/13/2023 0617   EOSABS 0.1 09/13/2023 0617   BASOSABS 0.1 09/13/2023 0617    BMET    Component Value Date/Time   NA 131 (L) 09/14/2023 0701   NA 140 10/26/2022 1432   K 2.9 (L) 09/14/2023 0701   CL 93 (L) 09/14/2023 0701   CO2 24 09/14/2023 0701   GLUCOSE 110 (H) 09/14/2023 0701   BUN 30 (H) 09/14/2023 0701   BUN 74 (H) 10/26/2022 1432   CREATININE 9.06 (H) 09/14/2023 0701   CALCIUM  8.1 (L) 09/14/2023 0701   CALCIUM  8.1 (L) 11/12/2008 1600   GFRNONAA 6 (L) 09/14/2023 0701   GFRAA 3 (L) 01/24/2020 1303     COAGS: Lab Results  Component Value Date   INR 1.16 11/03/2017   INR 1.26 11/02/2017   INR 1.07 09/14/2009     Non-Invasive Vascular Imaging:      ASSESSMENT/PLAN: This is a 54 y.o. male e with ESRD that presents for tunneled dialysis catheter placement.  Has a PD catheter that is not functioning well at the current time.  Previously  had a right IJ TDC placed on 02/08/2024 by Dr. Sheree that was removed.  Discussed plan for ultrasound guidance for tunneled dialysis catheter placement.  Will evaluate neck veins and otherwise could require groin access given multiple previous catheters.  All questions answered.  Lonni DOROTHA Gaskins, MD [facility] Office: [phone]  Lonni JINNY Gaskins     [1]  Allergies Allergen Reactions   Ace Inhibitors Anaphylaxis and Swelling   Lisinopril Swelling and Anaphylaxis    Angioedema  Other reaction(s): Angioedema (ALLERGY/intolerance)   Losartan Swelling and Anaphylaxis   Cat Dander Rash   "

## 2024-05-27 ENCOUNTER — Other Ambulatory Visit (HOSPITAL_BASED_OUTPATIENT_CLINIC_OR_DEPARTMENT_OTHER): Payer: Self-pay

## 2024-05-27 ENCOUNTER — Encounter (HOSPITAL_COMMUNITY): Payer: Self-pay | Admitting: Vascular Surgery

## 2024-05-28 ENCOUNTER — Other Ambulatory Visit (HOSPITAL_BASED_OUTPATIENT_CLINIC_OR_DEPARTMENT_OTHER): Payer: Self-pay

## 2024-05-28 MED ORDER — TRAZODONE HCL 50 MG PO TABS
200.0000 mg | ORAL_TABLET | Freq: Every day | ORAL | 2 refills | Status: AC
  Filled 2024-05-28: qty 360, 90d supply, fill #0

## 2024-06-01 ENCOUNTER — Other Ambulatory Visit (HOSPITAL_BASED_OUTPATIENT_CLINIC_OR_DEPARTMENT_OTHER): Payer: Self-pay

## 2024-06-04 ENCOUNTER — Other Ambulatory Visit (HOSPITAL_BASED_OUTPATIENT_CLINIC_OR_DEPARTMENT_OTHER): Payer: Self-pay

## 2024-06-04 MED ORDER — ONDANSETRON HCL 4 MG PO TABS
4.0000 mg | ORAL_TABLET | Freq: Four times a day (QID) | ORAL | 0 refills | Status: AC | PRN
  Filled 2024-06-04: qty 12, 3d supply, fill #0

## 2024-06-04 MED ORDER — METHOCARBAMOL 500 MG PO TABS
500.0000 mg | ORAL_TABLET | Freq: Three times a day (TID) | ORAL | 0 refills | Status: AC
  Filled 2024-06-04: qty 12, 4d supply, fill #0

## 2024-06-04 MED ORDER — HYDROCODONE-ACETAMINOPHEN 5-325 MG PO TABS
1.0000 | ORAL_TABLET | ORAL | 0 refills | Status: AC | PRN
  Filled 2024-06-04: qty 12, 2d supply, fill #0

## 2024-06-21 ENCOUNTER — Encounter (INDEPENDENT_AMBULATORY_CARE_PROVIDER_SITE_OTHER): Payer: Medicare Other | Admitting: Ophthalmology

## 2024-07-16 ENCOUNTER — Encounter (INDEPENDENT_AMBULATORY_CARE_PROVIDER_SITE_OTHER): Admitting: Ophthalmology
